# Patient Record
Sex: Male | Born: 1955 | Race: White | Hispanic: No | Marital: Married | Smoking: Former smoker
Health system: Southern US, Community
[De-identification: ages and names within clinical notes are randomized; demographics above are authoritative.]

## PROBLEM LIST (undated history)

## (undated) DIAGNOSIS — I10 Essential (primary) hypertension: Secondary | ICD-10-CM

## (undated) DIAGNOSIS — M1990 Unspecified osteoarthritis, unspecified site: Secondary | ICD-10-CM

## (undated) DIAGNOSIS — M549 Dorsalgia, unspecified: Secondary | ICD-10-CM

## (undated) DIAGNOSIS — F329 Major depressive disorder, single episode, unspecified: Secondary | ICD-10-CM

## (undated) DIAGNOSIS — N400 Enlarged prostate without lower urinary tract symptoms: Secondary | ICD-10-CM

## (undated) DIAGNOSIS — E785 Hyperlipidemia, unspecified: Secondary | ICD-10-CM

## (undated) DIAGNOSIS — F1120 Opioid dependence, uncomplicated: Secondary | ICD-10-CM

## (undated) DIAGNOSIS — F32A Depression, unspecified: Secondary | ICD-10-CM

## (undated) DIAGNOSIS — R569 Unspecified convulsions: Secondary | ICD-10-CM

## (undated) DIAGNOSIS — F419 Anxiety disorder, unspecified: Secondary | ICD-10-CM

## (undated) DIAGNOSIS — G4733 Obstructive sleep apnea (adult) (pediatric): Secondary | ICD-10-CM

## (undated) DIAGNOSIS — E669 Obesity, unspecified: Secondary | ICD-10-CM

## (undated) DIAGNOSIS — E7800 Pure hypercholesterolemia, unspecified: Secondary | ICD-10-CM

## (undated) DIAGNOSIS — Z79891 Long term (current) use of opiate analgesic: Secondary | ICD-10-CM

## (undated) DIAGNOSIS — G8929 Other chronic pain: Secondary | ICD-10-CM

## (undated) HISTORY — PX: CERVICAL SPINE SURGERY: SHX589

## (undated) HISTORY — DX: Pure hypercholesterolemia, unspecified: E78.00

## (undated) HISTORY — DX: Essential (primary) hypertension: I10

## (undated) HISTORY — DX: Hyperlipidemia, unspecified: E78.5

## (undated) HISTORY — DX: Unspecified osteoarthritis, unspecified site: M19.90

## (undated) HISTORY — DX: Benign prostatic hyperplasia without lower urinary tract symptoms: N40.0

## (undated) HISTORY — PX: PROSTATECTOMY: SHX69

## (undated) HISTORY — DX: Obesity, unspecified: E66.9

## (undated) HISTORY — DX: Obstructive sleep apnea (adult) (pediatric): G47.33

## (undated) HISTORY — PX: BACK SURGERY: SHX140

---

## 1997-11-17 ENCOUNTER — Emergency Department (HOSPITAL_COMMUNITY): Admission: EM | Admit: 1997-11-17 | Discharge: 1997-11-17 | Payer: Self-pay | Admitting: Emergency Medicine

## 1997-11-22 ENCOUNTER — Emergency Department (HOSPITAL_COMMUNITY): Admission: EM | Admit: 1997-11-22 | Discharge: 1997-11-22 | Payer: Self-pay | Admitting: Emergency Medicine

## 1998-04-18 ENCOUNTER — Emergency Department (HOSPITAL_COMMUNITY): Admission: EM | Admit: 1998-04-18 | Discharge: 1998-04-18 | Payer: Self-pay | Admitting: Emergency Medicine

## 1998-04-18 ENCOUNTER — Encounter: Payer: Self-pay | Admitting: Emergency Medicine

## 1998-10-09 ENCOUNTER — Encounter: Payer: Self-pay | Admitting: Internal Medicine

## 1998-10-09 ENCOUNTER — Ambulatory Visit (HOSPITAL_COMMUNITY): Admission: RE | Admit: 1998-10-09 | Discharge: 1998-10-09 | Payer: Self-pay | Admitting: Internal Medicine

## 1999-01-07 ENCOUNTER — Emergency Department (HOSPITAL_COMMUNITY): Admission: EM | Admit: 1999-01-07 | Discharge: 1999-01-07 | Payer: Self-pay

## 1999-04-18 ENCOUNTER — Emergency Department (HOSPITAL_COMMUNITY): Admission: EM | Admit: 1999-04-18 | Discharge: 1999-04-18 | Payer: Self-pay | Admitting: Emergency Medicine

## 1999-04-20 ENCOUNTER — Encounter: Payer: Self-pay | Admitting: Sports Medicine

## 1999-04-20 ENCOUNTER — Ambulatory Visit (HOSPITAL_COMMUNITY): Admission: RE | Admit: 1999-04-20 | Discharge: 1999-04-20 | Payer: Self-pay | Admitting: Sports Medicine

## 1999-06-27 ENCOUNTER — Emergency Department (HOSPITAL_COMMUNITY): Admission: EM | Admit: 1999-06-27 | Discharge: 1999-06-27 | Payer: Self-pay | Admitting: Emergency Medicine

## 1999-10-14 ENCOUNTER — Emergency Department (HOSPITAL_COMMUNITY): Admission: EM | Admit: 1999-10-14 | Discharge: 1999-10-14 | Payer: Self-pay | Admitting: Emergency Medicine

## 1999-11-04 ENCOUNTER — Encounter: Admission: RE | Admit: 1999-11-04 | Discharge: 2000-02-02 | Payer: Self-pay | Admitting: Anesthesiology

## 2000-01-11 ENCOUNTER — Emergency Department (HOSPITAL_COMMUNITY): Admission: EM | Admit: 2000-01-11 | Discharge: 2000-01-11 | Payer: Self-pay | Admitting: *Deleted

## 2000-01-17 ENCOUNTER — Encounter: Payer: Self-pay | Admitting: Internal Medicine

## 2000-01-17 ENCOUNTER — Ambulatory Visit (HOSPITAL_COMMUNITY): Admission: RE | Admit: 2000-01-17 | Discharge: 2000-01-17 | Payer: Self-pay | Admitting: Internal Medicine

## 2000-02-06 ENCOUNTER — Emergency Department (HOSPITAL_COMMUNITY): Admission: EM | Admit: 2000-02-06 | Discharge: 2000-02-06 | Payer: Self-pay | Admitting: Internal Medicine

## 2000-03-02 ENCOUNTER — Inpatient Hospital Stay (HOSPITAL_COMMUNITY): Admission: RE | Admit: 2000-03-02 | Discharge: 2000-03-06 | Payer: Self-pay | Admitting: Orthopaedic Surgery

## 2000-03-02 ENCOUNTER — Encounter (INDEPENDENT_AMBULATORY_CARE_PROVIDER_SITE_OTHER): Payer: Self-pay | Admitting: Specialist

## 2000-03-10 ENCOUNTER — Ambulatory Visit (HOSPITAL_COMMUNITY): Admission: RE | Admit: 2000-03-10 | Discharge: 2000-03-10 | Payer: Self-pay | Admitting: Orthopaedic Surgery

## 2000-05-17 ENCOUNTER — Encounter: Payer: Self-pay | Admitting: Emergency Medicine

## 2000-05-17 ENCOUNTER — Emergency Department (HOSPITAL_COMMUNITY): Admission: EM | Admit: 2000-05-17 | Discharge: 2000-05-17 | Payer: Self-pay | Admitting: Emergency Medicine

## 2000-06-28 ENCOUNTER — Ambulatory Visit (HOSPITAL_COMMUNITY): Admission: RE | Admit: 2000-06-28 | Discharge: 2000-06-28 | Payer: Self-pay | Admitting: Internal Medicine

## 2000-06-28 ENCOUNTER — Encounter: Payer: Self-pay | Admitting: Internal Medicine

## 2000-08-04 ENCOUNTER — Emergency Department (HOSPITAL_COMMUNITY): Admission: EM | Admit: 2000-08-04 | Discharge: 2000-08-04 | Payer: Self-pay | Admitting: Emergency Medicine

## 2000-08-04 ENCOUNTER — Encounter: Payer: Self-pay | Admitting: Emergency Medicine

## 2000-08-15 ENCOUNTER — Ambulatory Visit (HOSPITAL_COMMUNITY): Admission: RE | Admit: 2000-08-15 | Discharge: 2000-08-15 | Payer: Self-pay | Admitting: Orthopaedic Surgery

## 2000-12-07 ENCOUNTER — Encounter: Payer: Self-pay | Admitting: Emergency Medicine

## 2000-12-07 ENCOUNTER — Emergency Department (HOSPITAL_COMMUNITY): Admission: EM | Admit: 2000-12-07 | Discharge: 2000-12-07 | Payer: Self-pay | Admitting: Emergency Medicine

## 2000-12-11 ENCOUNTER — Emergency Department (HOSPITAL_COMMUNITY): Admission: EM | Admit: 2000-12-11 | Discharge: 2000-12-11 | Payer: Self-pay | Admitting: Emergency Medicine

## 2000-12-17 ENCOUNTER — Emergency Department (HOSPITAL_COMMUNITY): Admission: EM | Admit: 2000-12-17 | Discharge: 2000-12-17 | Payer: Self-pay | Admitting: Emergency Medicine

## 2000-12-17 ENCOUNTER — Encounter: Payer: Self-pay | Admitting: Emergency Medicine

## 2000-12-20 ENCOUNTER — Encounter: Payer: Self-pay | Admitting: Emergency Medicine

## 2000-12-20 ENCOUNTER — Emergency Department (HOSPITAL_COMMUNITY): Admission: EM | Admit: 2000-12-20 | Discharge: 2000-12-20 | Payer: Self-pay | Admitting: Emergency Medicine

## 2001-05-06 ENCOUNTER — Emergency Department (HOSPITAL_COMMUNITY): Admission: EM | Admit: 2001-05-06 | Discharge: 2001-05-06 | Payer: Self-pay

## 2001-05-25 ENCOUNTER — Encounter: Admission: RE | Admit: 2001-05-25 | Discharge: 2001-05-25 | Payer: Self-pay | Admitting: Orthopaedic Surgery

## 2001-06-05 ENCOUNTER — Encounter: Admission: RE | Admit: 2001-06-05 | Discharge: 2001-06-05 | Payer: Self-pay | Admitting: Orthopaedic Surgery

## 2001-06-19 ENCOUNTER — Encounter: Admission: RE | Admit: 2001-06-19 | Discharge: 2001-06-19 | Payer: Self-pay | Admitting: Orthopaedic Surgery

## 2001-07-03 ENCOUNTER — Encounter: Admission: RE | Admit: 2001-07-03 | Discharge: 2001-07-03 | Payer: Self-pay | Admitting: Orthopaedic Surgery

## 2001-07-31 ENCOUNTER — Emergency Department (HOSPITAL_COMMUNITY): Admission: EM | Admit: 2001-07-31 | Discharge: 2001-07-31 | Payer: Self-pay

## 2001-08-21 ENCOUNTER — Ambulatory Visit (HOSPITAL_BASED_OUTPATIENT_CLINIC_OR_DEPARTMENT_OTHER): Admission: RE | Admit: 2001-08-21 | Discharge: 2001-08-21 | Payer: Self-pay | Admitting: *Deleted

## 2001-10-01 ENCOUNTER — Encounter: Payer: Self-pay | Admitting: Emergency Medicine

## 2001-10-02 ENCOUNTER — Encounter (INDEPENDENT_AMBULATORY_CARE_PROVIDER_SITE_OTHER): Payer: Self-pay | Admitting: Specialist

## 2001-10-02 ENCOUNTER — Encounter: Payer: Self-pay | Admitting: *Deleted

## 2001-10-02 ENCOUNTER — Inpatient Hospital Stay (HOSPITAL_COMMUNITY): Admission: EM | Admit: 2001-10-02 | Discharge: 2001-10-03 | Payer: Self-pay | Admitting: *Deleted

## 2001-10-03 ENCOUNTER — Encounter: Payer: Self-pay | Admitting: *Deleted

## 2001-10-11 ENCOUNTER — Ambulatory Visit (HOSPITAL_BASED_OUTPATIENT_CLINIC_OR_DEPARTMENT_OTHER): Admission: RE | Admit: 2001-10-11 | Discharge: 2001-10-11 | Payer: Self-pay | Admitting: *Deleted

## 2002-05-30 ENCOUNTER — Encounter: Payer: Self-pay | Admitting: Neurology

## 2002-05-30 ENCOUNTER — Encounter: Admission: RE | Admit: 2002-05-30 | Discharge: 2002-05-30 | Payer: Self-pay | Admitting: Neurology

## 2002-06-24 ENCOUNTER — Inpatient Hospital Stay (HOSPITAL_COMMUNITY): Admission: RE | Admit: 2002-06-24 | Discharge: 2002-06-25 | Payer: Self-pay | Admitting: Orthopaedic Surgery

## 2002-07-14 ENCOUNTER — Emergency Department (HOSPITAL_COMMUNITY): Admission: EM | Admit: 2002-07-14 | Discharge: 2002-07-14 | Payer: Self-pay | Admitting: Emergency Medicine

## 2003-06-18 ENCOUNTER — Ambulatory Visit (HOSPITAL_COMMUNITY): Admission: RE | Admit: 2003-06-18 | Discharge: 2003-06-19 | Payer: Self-pay | Admitting: Orthopaedic Surgery

## 2003-09-11 ENCOUNTER — Ambulatory Visit (HOSPITAL_COMMUNITY): Admission: RE | Admit: 2003-09-11 | Discharge: 2003-09-11 | Payer: Self-pay | Admitting: Internal Medicine

## 2004-04-01 ENCOUNTER — Ambulatory Visit (HOSPITAL_COMMUNITY): Admission: RE | Admit: 2004-04-01 | Discharge: 2004-04-01 | Payer: Self-pay | Admitting: Internal Medicine

## 2004-08-17 ENCOUNTER — Ambulatory Visit (HOSPITAL_COMMUNITY): Admission: RE | Admit: 2004-08-17 | Discharge: 2004-08-17 | Payer: Self-pay | Admitting: Internal Medicine

## 2005-02-05 ENCOUNTER — Emergency Department (HOSPITAL_COMMUNITY): Admission: EM | Admit: 2005-02-05 | Discharge: 2005-02-06 | Payer: Self-pay | Admitting: Emergency Medicine

## 2005-03-28 ENCOUNTER — Ambulatory Visit (HOSPITAL_COMMUNITY): Admission: RE | Admit: 2005-03-28 | Discharge: 2005-03-28 | Payer: Self-pay | Admitting: Internal Medicine

## 2005-04-01 ENCOUNTER — Ambulatory Visit: Payer: Self-pay | Admitting: Internal Medicine

## 2005-04-13 ENCOUNTER — Ambulatory Visit: Payer: Self-pay | Admitting: Internal Medicine

## 2005-04-13 ENCOUNTER — Encounter (INDEPENDENT_AMBULATORY_CARE_PROVIDER_SITE_OTHER): Payer: Self-pay | Admitting: Specialist

## 2005-05-04 ENCOUNTER — Emergency Department (HOSPITAL_COMMUNITY): Admission: EM | Admit: 2005-05-04 | Discharge: 2005-05-04 | Payer: Self-pay | Admitting: Emergency Medicine

## 2005-05-10 ENCOUNTER — Ambulatory Visit: Payer: Self-pay | Admitting: Internal Medicine

## 2005-05-17 ENCOUNTER — Ambulatory Visit: Payer: Self-pay | Admitting: Internal Medicine

## 2005-06-06 ENCOUNTER — Ambulatory Visit: Payer: Self-pay | Admitting: Internal Medicine

## 2005-06-08 ENCOUNTER — Ambulatory Visit: Payer: Self-pay | Admitting: Cardiology

## 2005-06-13 ENCOUNTER — Ambulatory Visit: Payer: Self-pay | Admitting: Emergency Medicine

## 2005-06-20 ENCOUNTER — Ambulatory Visit (HOSPITAL_BASED_OUTPATIENT_CLINIC_OR_DEPARTMENT_OTHER): Admission: RE | Admit: 2005-06-20 | Discharge: 2005-06-20 | Payer: Self-pay | Admitting: Internal Medicine

## 2005-06-21 ENCOUNTER — Ambulatory Visit: Payer: Self-pay | Admitting: Pulmonary Disease

## 2005-07-28 ENCOUNTER — Ambulatory Visit: Payer: Self-pay | Admitting: Internal Medicine

## 2005-08-28 ENCOUNTER — Emergency Department (HOSPITAL_COMMUNITY): Admission: EM | Admit: 2005-08-28 | Discharge: 2005-08-28 | Payer: Self-pay | Admitting: Emergency Medicine

## 2005-08-30 ENCOUNTER — Ambulatory Visit (HOSPITAL_COMMUNITY): Admission: RE | Admit: 2005-08-30 | Discharge: 2005-08-30 | Payer: Self-pay | Admitting: Sports Medicine

## 2005-08-31 ENCOUNTER — Ambulatory Visit: Payer: Self-pay | Admitting: Cardiovascular Disease

## 2005-09-13 ENCOUNTER — Ambulatory Visit: Payer: Self-pay | Admitting: Pulmonary Disease

## 2005-10-10 ENCOUNTER — Ambulatory Visit: Payer: Self-pay | Admitting: Pulmonary Disease

## 2006-01-22 ENCOUNTER — Emergency Department (HOSPITAL_COMMUNITY): Admission: EM | Admit: 2006-01-22 | Discharge: 2006-01-22 | Payer: Self-pay | Admitting: Emergency Medicine

## 2006-01-23 ENCOUNTER — Emergency Department (HOSPITAL_COMMUNITY): Admission: EM | Admit: 2006-01-23 | Discharge: 2006-01-23 | Payer: Self-pay | Admitting: *Deleted

## 2006-02-02 ENCOUNTER — Inpatient Hospital Stay (HOSPITAL_COMMUNITY): Admission: RE | Admit: 2006-02-02 | Discharge: 2006-02-04 | Payer: Self-pay | Admitting: Urology

## 2006-02-02 ENCOUNTER — Encounter (INDEPENDENT_AMBULATORY_CARE_PROVIDER_SITE_OTHER): Payer: Self-pay | Admitting: Specialist

## 2006-06-03 ENCOUNTER — Ambulatory Visit: Payer: Self-pay | Admitting: Cardiology

## 2006-06-03 ENCOUNTER — Observation Stay (HOSPITAL_COMMUNITY): Admission: EM | Admit: 2006-06-03 | Discharge: 2006-06-04 | Payer: Self-pay | Admitting: Emergency Medicine

## 2006-06-08 ENCOUNTER — Ambulatory Visit: Payer: Self-pay

## 2006-06-12 ENCOUNTER — Ambulatory Visit: Payer: Self-pay | Admitting: Cardiology

## 2006-06-13 ENCOUNTER — Encounter: Payer: Self-pay | Admitting: Cardiology

## 2006-06-13 ENCOUNTER — Ambulatory Visit: Payer: Self-pay

## 2006-07-04 ENCOUNTER — Ambulatory Visit: Payer: Self-pay | Admitting: Cardiovascular Disease

## 2006-07-07 ENCOUNTER — Ambulatory Visit (HOSPITAL_BASED_OUTPATIENT_CLINIC_OR_DEPARTMENT_OTHER): Admission: RE | Admit: 2006-07-07 | Discharge: 2006-07-07 | Payer: Self-pay | Admitting: Surgery

## 2007-02-19 ENCOUNTER — Ambulatory Visit: Payer: Self-pay | Admitting: Pulmonary Disease

## 2007-11-01 ENCOUNTER — Ambulatory Visit (HOSPITAL_COMMUNITY): Admission: RE | Admit: 2007-11-01 | Discharge: 2007-11-02 | Payer: Self-pay | Admitting: Otolaryngology

## 2007-11-01 ENCOUNTER — Encounter (INDEPENDENT_AMBULATORY_CARE_PROVIDER_SITE_OTHER): Payer: Self-pay | Admitting: Otolaryngology

## 2008-10-30 ENCOUNTER — Emergency Department (HOSPITAL_COMMUNITY): Admission: EM | Admit: 2008-10-30 | Discharge: 2008-10-30 | Payer: Self-pay | Admitting: Emergency Medicine

## 2008-12-31 ENCOUNTER — Ambulatory Visit (HOSPITAL_COMMUNITY): Admission: RE | Admit: 2008-12-31 | Discharge: 2008-12-31 | Payer: Self-pay | Admitting: Internal Medicine

## 2009-07-09 ENCOUNTER — Ambulatory Visit (HOSPITAL_COMMUNITY): Admission: RE | Admit: 2009-07-09 | Discharge: 2009-07-09 | Payer: Self-pay | Admitting: Orthopaedic Surgery

## 2009-07-24 ENCOUNTER — Emergency Department (HOSPITAL_BASED_OUTPATIENT_CLINIC_OR_DEPARTMENT_OTHER): Admission: EM | Admit: 2009-07-24 | Discharge: 2009-07-25 | Payer: Self-pay | Admitting: Emergency Medicine

## 2010-03-19 LAB — HM COLONOSCOPY

## 2010-06-13 ENCOUNTER — Encounter: Payer: Self-pay | Admitting: Sports Medicine

## 2010-08-17 ENCOUNTER — Other Ambulatory Visit (HOSPITAL_COMMUNITY): Payer: Self-pay | Admitting: Orthopaedic Surgery

## 2010-08-17 DIAGNOSIS — M25512 Pain in left shoulder: Secondary | ICD-10-CM

## 2010-08-17 DIAGNOSIS — M75100 Unspecified rotator cuff tear or rupture of unspecified shoulder, not specified as traumatic: Secondary | ICD-10-CM

## 2010-08-24 ENCOUNTER — Ambulatory Visit (HOSPITAL_COMMUNITY)
Admission: RE | Admit: 2010-08-24 | Discharge: 2010-08-24 | Disposition: A | Discharge status: Home / Self Care | Payer: 59 | Source: Ambulatory Visit | Attending: Orthopaedic Surgery | Admitting: Orthopaedic Surgery

## 2010-08-24 DIAGNOSIS — M75100 Unspecified rotator cuff tear or rupture of unspecified shoulder, not specified as traumatic: Secondary | ICD-10-CM

## 2010-08-24 DIAGNOSIS — M67919 Unspecified disorder of synovium and tendon, unspecified shoulder: Secondary | ICD-10-CM | POA: Insufficient documentation

## 2010-08-24 DIAGNOSIS — M19019 Primary osteoarthritis, unspecified shoulder: Secondary | ICD-10-CM | POA: Insufficient documentation

## 2010-08-24 DIAGNOSIS — M25512 Pain in left shoulder: Secondary | ICD-10-CM

## 2010-08-24 DIAGNOSIS — M25519 Pain in unspecified shoulder: Secondary | ICD-10-CM | POA: Insufficient documentation

## 2010-08-24 DIAGNOSIS — M719 Bursopathy, unspecified: Secondary | ICD-10-CM | POA: Insufficient documentation

## 2010-08-30 LAB — CBC
HCT: 49.7 % (ref 39.0–52.0)
Hemoglobin: 16.9 g/dL (ref 13.0–17.0)
MCHC: 33.9 g/dL (ref 30.0–36.0)
MCV: 94.4 fL (ref 78.0–100.0)
Platelets: 150 10*3/uL (ref 150–400)
RBC: 5.27 MIL/uL (ref 4.22–5.81)
RDW: 14.1 % (ref 11.5–15.5)
WBC: 16.2 10*3/uL — ABNORMAL HIGH (ref 4.0–10.5)

## 2010-08-30 LAB — COMPREHENSIVE METABOLIC PANEL
ALT: 23 U/L (ref 0–53)
AST: 38 U/L — ABNORMAL HIGH (ref 0–37)
Albumin: 3.5 g/dL (ref 3.5–5.2)
Alkaline Phosphatase: 39 U/L (ref 39–117)
BUN: 5 mg/dL — ABNORMAL LOW (ref 6–23)
CO2: 30 mEq/L (ref 19–32)
Calcium: 9.1 mg/dL (ref 8.4–10.5)
Chloride: 99 mEq/L (ref 96–112)
Creatinine, Ser: 1.22 mg/dL (ref 0.4–1.5)
GFR calc Af Amer: >60 mL/min (ref >60)
GFR calc non Af Amer: >60 mL/min (ref >60)
Glucose, Bld: 111 mg/dL — ABNORMAL HIGH (ref 70–99)
Potassium: 3.6 mEq/L (ref 3.5–5.1)
Sodium: 138 mEq/L (ref 135–145)
Total Bilirubin: 1.1 mg/dL (ref 0.3–1.2)
Total Protein: 6.1 g/dL (ref 6.0–8.3)

## 2010-08-30 LAB — DIFFERENTIAL
Basophils Relative: 0 % (ref 0–1)
Monocytes Absolute: 0.5 10*3/uL (ref 0.1–1.0)
Monocytes Relative: 3 % (ref 3–12)
Neutro Abs: 15 10*3/uL — ABNORMAL HIGH (ref 1.7–7.7)

## 2010-08-30 LAB — CULTURE, BLOOD (ROUTINE X 2)

## 2010-08-30 LAB — URINALYSIS, ROUTINE W REFLEX MICROSCOPIC
Glucose, UA: NEGATIVE mg/dL
Hgb urine dipstick: NEGATIVE
Protein, ur: NEGATIVE mg/dL
Specific Gravity, Urine: 1.011 (ref 1.005–1.030)

## 2010-10-05 NOTE — Op Note (Signed)
NAME:  Jared [name], Jared [name] NO.:  [id number]   MEDICAL RECORD NO.:  [id number]          PATIENT TYPE:  OIB   LOCATION:  3311                         FACILITY:  [facility]   PHYSICIAN:  Suzanna Obey, M.D.       DATE OF BIRTH:  1955/07/20   DATE OF PROCEDURE:  11/01/2007  DATE OF DISCHARGE:                               OPERATIVE REPORT   PREOPERATIVE DIAGNOSIS:  Obstructive sleep apnea.   POSTOPERATIVE DIAGNOSIS:  Obstructive sleep apnea.   SURGICAL PROCEDURE:  Uvulopharyngopalatoplasty and tonsillectomy.   ANESTHESIA:  General.   ESTIMATED BLOOD LOSS:  Approximately 10 mL.   INDICATIONS:  This is a 55 year old with significant obstructive sleep  apnea who has basically tried CPAP, but is not doing well at all.  He  does not seem to have a significant amount of nasal obstruction.  He  wants to proceed with the surgical options with  uvulopharyngopalatoplasty and tonsillectomy, knowing the risks of the  failure to cure the sleep apnea.  All his questions were answered and  options were discussed.  The risks and benefits were also discussed.  Consent was obtained.   OPERATION:  The patient was taken to the operating room and placed in  supine position.  After adequate general endotracheal tube anesthesia,  he was placed in the supine position and was draped in the usual sterile  manner.  A Crowe-Davis mouth gag was inserted, retracted, and suspended  from the Mayo stand.  There was good visualization of the pharynx in  neutral position.  At the left tonsil, we began making a left anterior  tonsillar pillar incision with electrocautery dissecting down through  the capsule, the incision was brought across the soft palate at the base  of the uvula into the right tonsil area.  Both tonsils were removed with  electrocautery dissection along the capsule and then the palate was  divided at its incision site along the edge of the palate.  Hemostasis  was achieved with suction  cautery.  The wound was then closed with  interrupted 3-0 Vicryl sutures.  Irrigation with saline.  The CroweEarlene Plater was released and resuspended.  Hemostasis present in all  locations.  Then, the hypopharynx, esophagus, and stomach were suctioned  with the NG tube.  The patient was awakened and brought to the recovery  room in stable condition.  Counts were correct.           ______________________________  Suzanna Obey, M.D.     JB/MEDQ  D:  11/01/2007  T:  11/02/2007  Job:  [id number]

## 2010-10-08 NOTE — Discharge Summary (Signed)
NAME:  Jared [name], Jared [name] NO.:  [id number]   MEDICAL RECORD NO.:  [id number]          PATIENT TYPE:  OBV   LOCATION:  1439                         [facility]ACILITY:  [facility]   PHYSICIAN:  Gerrit Friends. Dietrich Pates, MD, FACCDATE OF BIRTH:  Oct 13, 1955   DATE OF ADMISSION:  06/03/2006  DATE OF DISCHARGE:  06/04/2006                               DISCHARGE SUMMARY   PRIMARY CARDIOLOGIST:  Rollene Rotunda, MD.   DISCHARGING DIAGNOSIS:  Chest pain.  Patient with negative cardiac  workup this admission.  Cardiac enzymes with a troponin of 0.01.  D-  dimer of less than 0.22.  Chest x-ray showing no active cardiopulmonary  disease with stable cardiac enlargement.   PAST MEDICAL HISTORY:  1. Hypertension.  2. Hyperlipidemia.  3. Former smoker.  4. 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  5. BPH, status post TURP procedure in October 2007.  6. Obstructive sleep apnea on CPAP q.h.s.   HOSPITAL COURSE:  Jared [name] is a 55 year old Caucasian gentleman with  cardiac risk factors as stated above, seen in the past (greater than 10  years ago) by someone at [facility].  The patient reports he  underwent a stress test that time.  He presented to [facility]  emergency room complaining of chest pain he described as sharp, left-  sided chest pain over several days.  The patient felt that it often  seemed provoked by stress or anxiety.  The patient was seen by Dr.  Nevin Bloodgood.  He reviewed EKG, which showed normal sinus rhythm at 60 beats  per minute with borderline voltage criteria for LVH, no acute ST or T-  wave abnormalities.  Baseline lab work showed a WBC of 6, hemoglobin  14.5, hematocrit 41, platelets 229,000.  Potassium 4.2, BUN and  creatinine 11 and 1.1.  Point of care markers were negative.  The  patient had atypical chest pain.  EKG was unremarkable.  Dr. Nevin Bloodgood felt  the patient  was a low risk for acute coronary syndrome.  He admitted the  patient for observation for 23 hours, continued the patient's home  medications, cycled cardiac markers, also continued the patient's home  medication for chronic neck and back pain including oxycodone ER with  p.o. oxycodone for breakthrough discomfort.  Dr. Dietrich Pates saw the  patient on day of discharge, at which time the patient was stable.  His  temperature was 98.1, heart rate 59, respirations 20, blood pressure  113/65.  He was saturating 94% on room air.  Weight 248.2 pounds.  The  patient's blood work ruled him out for a myocardial infarction.  Other  lab work as stated above.  The patient's AST was 30, ALT 38.  The  patient discharged home by Dr. Dietrich Pates with instructions to continue  his previous medications and to add aspirin.  Discharge instructions  were given by Dr. Dietrich Pates.   At time of admission, H&P states that the patient home medications are:  1.  Atenolol 100 mg daily.  2. Benicar/HCT 40/12.5 mg daily.  3. Lipitor 20 mg daily.  4. Fish oil.  5. Potassium chloride 20 mEq daily.  6. Cymbalta 60 mg daily.  7. Oxycodone extended release 80 mg t.i.d.   The patient has an allergy to MORPHINE.   I did not see the patient to confirm these home medications with him.  The patient also needs to follow up with a stress Myoview at our office  and then have an appointment to see Dr. Antoine Poche post stress test  concerning results of Myoview.  I have called the office in left a  message for them to contact the patient at home regarding stress Myoview  and follow-up appoint with Dr. Antoine Poche as I did not personally speak  with the patient myself.   DURATION OF DISCHARGE DICTATION:  15 minutes.      Dorian Pod, ACNP      Gerrit Friends. Dietrich Pates, MD, [facility]  Electronically Signed    MB/MEDQ  D:  06/05/2006  T:  06/06/2006  Job:  [id number]   cc:   Lucky Cowboy, M.D.

## 2010-10-08 NOTE — Op Note (Signed)
[place]  Patient:    Jared [name], Jared [name]                        MRN: [id number] Proc. Date: 03/02/00 Adm. Date:  [id number] Disc. 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  PROCEDURE:  L5 laminectomy with removal of free fragment, L5-S1, L5-S1 fusion, pedicle screw instrumentation, with local and banked bone graft.  SURGEON:  Mark C. Ophelia Charter, M.D.  ASSISTANT:  Colon Flattery. Ollen Bowl, M.D.  ANESTHESIA:  GOT.  BLOOD LOSS:  1000 ml.  INSTRUMENTATION:  ______  pedicle screws, 5.5 x 40.0-mm rod, 6.75 x 40-mm pedicle screws.  DESCRIPTION OF PROCEDURE:  After induction of general anesthesia with orotracheal intubation, patient was placed in the supine position on chest rolls on the fluoroscopic table.  After careful positioning and padding, back was prepped with Duraprep.  Preoperative Ancef was given prophylactically. Area was squared with towels, Betadine and ViDrape applied and laminectomy sheets and drapes.  A midline incision was made, incorporating the old midline incision.  Incision was extended proximally and distally and subperiosteal dissection on the lamina was performed.  All previous scar tissue was encountered and the lamina of L5 was meticulously cleaned of soft tissue and then removed with a large Kerrison rongeur after patties were placed and the undersurface of the lamina was freed up from any scar tissue.  Bone was cleaned of soft tissue and placed in a cup for use in later bone grafting. Patient had had previous disk herniation on the left at L5 with central disk herniation with free fragment which had migrated inferiorly.  All bone was removed out to the lateral gutters and hypertrophic ligamentum was  removed out to the level of the pedicle.  Once the decompression was done out to the level of the pedicle, the operative microscope was draped, brought in and using the operative microscope, bilateral foraminotomies were performed with removal of the free fragment, disk herniation and several passes were made through the L5-S1 disk with minimal remaining degenerative disk material. Once the area anterior to the dura was completely clear of any fragments and two small epidural veins had been cauterized, with both pedicles well-exposed and easily palpated, the ______  pedicle screws were placed by removing the bone overlying the superior facets, sounding the pedicle with the Joystick, blunt trocar, checking it under fluoroscopy, AP and lateral, and then passing the screw after depth gauge measurements.  All four screws were inserted and were entirely within the pedicle, with convergence anteriorly about midway in the body.  Nerve roots were inspected and all four screws were in satisfactory position.  Rods were connected and then the transverse processes were exposed. Lateral gutter was curetted with straight and angled curettes.  A Hall bur was used to decorticate the transverse process to a good bleeding surface and a combination of local bone graft and banked bone graft was used, splitting the local and banked bone into two even piles and placing half on the left gutter and half on the right.  Bone was meticulously placed in the gutter between the transverse processes and up onto the sacral ala.  There  was no bone in the midline and there was thorough decompression of the area of stenosis.  Fascia was reapproximated with 0 Vicryl sutures, 2-0 Vicryl in subcutaneous tissue, skin staple closure, Marcaine infiltration in the skin and a postop dressing. Patient tolerated the procedure well and was neurologically intact in the recovery room. DD:  04/15/00 TD:  04/15/00 Job:  [id number]

## 2010-10-08 NOTE — Discharge Summary (Signed)
Jared [name], MOSLEY NO.:  [id number]   MEDICAL RECORD NO.:  [id number]          PATIENT TYPE:  EMS   LOCATION:  ED                           FACILITY:  [facility]   PHYSICIAN:  Madaline Savage, MD        DATE OF BIRTH:  27-Aug-1955   DATE OF ADMISSION:  01/23/2006  DATE OF DISCHARGE:  01/23/2006                                 DISCHARGE SUMMARY   REASON FOR CONSULTATION:  Consult from the ER for possible admission.   HISTORY OF PRESENT ILLNESS:  Mr. Mounger is a 55 year old Caucasian male with  a past medical history significant for recent hospital admission in May of  2007 with acute respiratory failure, renal failure and change in mental  status at [facility].  At that time he was in the hospital  but it is not clear what the etiology of his illness was.  He also was  evaluated for inability to urinate and was seen by Dr. Etta Grandchild of Urology.  Since then he has been following with Dr. Etta Grandchild, the urologist, and  yesterday he was here because he could not urinate.  In the ER a Foley  catheter was placed yesterday and he was sent home with the Foley catheter.  He states he started having fever last night with chills and he said the  fever went up to 99 and touched 100 but since then the fever has decreased.  He has also called his urologist who ordered antibiotic and asked him to  come to the ER.  He also complains of increasing swelling in his extremities  which has been going on for awhile now.  He has been following with the  urologist for this complaint.   PHYSICAL EXAMINATION:  GENERAL:  He is alert and oriented x3.  VITAL SIGNS:  Temperature 98.7 (maximum temperature in the ER), blood  pressure 132/60, pulse rate 68, respirations 16.  HEENT:  Head:  Normocephalic, atraumatic.  Pupils:  Bilateral react to  light.  Mucous membranes:  Moist.  NECK:  Supple.  No JVD, no carotid bruits.  CARDIOVASCULAR:  S1, S2  heard.  Regular rate and rhythm.  No  murmurs, rubs  or gallops.  CHEST:  Clear to auscultation.  ABDOMEN:  Soft.  Bowel sounds heard.  EXTREMITIES:  Mild pitting edema.  NEUROLOGIC:  No focal deficits found.   LABORATORY DATA:  White count 7.1, 70% neutrophils, 15% lymphocytes,  hemoglobin 14.7, platelet count 188,000.  Sodium 134, potassium 3.6,  chloride 98, bicarbonate 31, BUN 11, creatinine 1.6 (decreased from 1.7  yesterday).  LFTs within normal limits.  Urinalysis showed leukocyte  esterase negative, nitrites negative.  No leukocytes.   ASSESSMENT/PLAN:  This is a 55 year old gentleman who comes in with  nonspecific fever but he is afebrile in the emergency room.  He is much more  comfortable.  I discussed the possibility of keeping him in and waiting for  the results of blood cultures.  He has an appointment to see his urologist,  Dr. Etta Grandchild, for tomorrow and he said he  would rather go home and followup  with his urologist.  He will go home with the Foley catheter.  He will  followup with urologist tomorrow.  His urologist has ordered an antibiotic,  which they will pick up.  They do not know the  name of the antibiotic at this point in time.  I told them that if the blood  culture grows anything I would call them to let them know about it.   CONDITION ON DISCHARGE:  He is being discharged home in a stable condition.      Madaline Savage, MD  Electronically Signed     PKN/MEDQ  D:  01/23/2006  T:  01/23/2006  Job:  [id number]

## 2010-10-08 NOTE — H&P (Signed)
[facility] ADMISSION   Jared [name], Jared [name]                        MRN:          [id number]  DATE:07/04/2006                            DOB:          07-May-1956    Jared [name] is seen today in followup.  He was initially seen for chest  pain at [facility] at the end of January.  His pain was  atypical.  He had a low risk Myoview study on January 17th with probable  diaphragmatic attenuation and an EF of 49%, which visually looked  normal.   Patient's coronary risk factors include remote tobacco abuse,  hyperlipidemia, and hypertension.   The patient also has significant obstructive sleep apnea.   He is preop for hernia surgery repair with Dr. Wenda Low later this  week.  In talking to the patient, he is not having any recurrent chest  pain.  He had a low risk Myoview.  He had a 2D echocardiogram done here  in the office, which showed no significant valvular heart disease and  normal LV function.  I think actually his biggest risk for surgery  revolves around his sleep apnea and use of BiPAP at night.  He is also  allergic to MORPHINE, and we need to make sure he does not get this  perioperatively.   From a cardiac perspective, he is otherwise stable.  His current  medications include atenolol 100 a day.  I told him to make sure to take  this medicine even the morning before the surgery, Lipitor 20 a day,  Cymbalta 60 a day, Benicar/HCT 40/25, potassium 40 a day.   His baseline EKG is normal.   PHYSICAL EXAMINATION:  VITAL SIGNS:  He is overweight.  Blood pressure  is 115/72, pulse 62 and regular.  NECK:  He has had a previous anterior cervical spine fusion.  HEENT:  Normal.  LUNGS:  Clear.  HEART:  There is an S1 and S2 with normal heart sounds.  ABDOMEN:  Benign.  EXTREMITIES:  He has a very small ventral umbilical hernia.  Distal  pulses are intact with no edema.   IMPRESSION:  1. Stable  atypical chest pain at the end of January, low risk Myoview,      and normal left ventricular function by echocardiogram.  No      recurrent chest pain.  Continue beta blocker therapy      perioperatively.  Blood pressure well controlled on Benicar.  2. Hypercholesterolemia:  Continue low dose Lipitor.   Again, the patient's biggest risk for surgery involves his airway with  previous cervical spine fusion and sleep apnea.  I am sure anesthesia  will follow this closely but from a cardiac  perspective, he is low risk for complications.  He will take his beta  blocker the morning of the  surgery.  I think this is planned for the [facility] day hospital and should  be an outpatient procedure.     [name]. Eden Emms, MD, [facility]  Electronically Signed    PCN/MedQ  DD: 07/04/2006  DT: 07/04/2006  Job #: [id number]

## 2010-10-08 NOTE — H&P (Signed)
[place]  Patient:    Jared [name], Jared [name] Visit Number: [id number] MRN: [id number]          Service Type: MED Location: [id number] Attending Physician:  Daisey Must Dictated by:   Daisey Must, M.D. [facility] Admit Date:  10/02/2001   CC:         Jared [name], M.D.   History and Physical  CHIEF COMPLAINT: Chest pain.  HISTORY OF PRESENT ILLNESS: Jared [name] is a 55 year old male, with cardiac risk factors of hypertension, hyperlipidemia, and tobacco abuse.  He has no known prior cardiac history.  However, he states approximately 10 years ago he was evaluated for chest pain and was told that no abnormalities were found. This evening he was lying on his bed and watching TV at approximately 7:30 p.m. when he developed the sudden onset of a sharp, stabbing left-sided chest pain.  An episode of pain would last only seconds at a time.  He had multiple recurrences.  It then became a dull constant pain which did not go away.  He took an aspirin and was driven by his wife to the [facility] Emergency Room.  In the [facility] Emergency Room he was treated with sublingual and intravenous nitroglycerin and aspirin, and eventually his pain decreased and now there is only very minimal residual.  Associated with this chest pain were symptoms of nausea, shortness of breath, and diaphoresis. In addition, he noted some left arm discomfort throughout the day, not necessarily in conjunction with the chest pain.  The patient denies having prior episodes of pain similar to this.  Recently he was diagnosed with sleep apnea and started on CPAP approximately three days ago.  He states since that time he has had difficulty with what he has felt to be edema in both his hands and feet.  He has also had some dyspnea, which actually has been going on for about four weeks.  He states if he walks upstairs he will become short of breath.  There is no  orthopnea or PND.  He denies any palpitations.  He does have occasional episodes of dizziness.  CARDIAC RISK FACTORS: As outlined above.  He denies history of diabetes or family history of premature coronary artery disease.  PAST MEDICAL HISTORY:  1. Hypertension.  2. Hyperlipidemia.  3. Sleep apnea.  4. Migraine headaches.  5. Status post lumbar spine surgery.  CURRENT MEDICATIONS:  1. Atenolol 100 mg q.d.  2. Verapamil Sustained Release 240 mg q.d.  3. Depakote, unknown dosage, b.i.d.  4. Effexor 75 mg two tablets in the morning, one in the evening.  5. Lipitor 40 mg q.d.  ALLERGIES: NEURONTIN.  SOCIAL HISTORY: The patient is married with one child.  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  Tobacco, currently smokes two to four small cigars per day.  He has a history of greater tobacco use in the past. Alcohol use is social.  Denies illicit drug use.  FAMILY HISTORY: Mother is alive at age 51 with "an enlarged heart."  Father is alive and well at age 25.  There is no family history of premature coronary artery disease.  REVIEW OF SYSTEMS: As outlined above.  Otherwise unremarkable.  PHYSICAL EXAMINATION:  GENERAL: This is a somewhat overweight but otherwise well-appearing male in no acute distress.  VITAL SIGNS: Temperature, afebrile.  Pulse 57 and regular.  Blood pressure is 123/70.  SKIN: Warm and dry without generalized rash.  HEENT: Normocephalic, atraumatic.  Sclerae anicteric.  Eyelids normal.  Oral mucosa is unremarkable.  NECK: There is no adenopathy or thyromegaly.  No JVD.  Carotid upstrokes normal without bruits.  CHEST: Clear to percussion and auscultation.  CARDIAC: Apical impulse nondisplaced.  Regular rate and rhythm.  Positive S4. Normal S1 and S2 without rub, murmur, or S3.  ABDOMEN: Soft, nontender, without organomegaly.  Positive bowel sounds.  No bruits.  EXTREMITIES: No significant clubbing, cyanosis, or edema noted.   Peripheral pulses 2+ throughout with no femoral bruits.  LABORATORY DATA: EKG reveals normal sinus rhythm at a rate of 68, normal EKG.  CBC is within normal limits.  Basic metabolic panel is also within normal limits with a BUN of 5, creatinine 1.0, and potassium 3.5.  PT and PTT are normal.  CK total 128, CK-MB 1.6.  Troponin I is 0.03.  Chest x-ray is pending.  ASSESSMENT/PLAN:  1. Chest pain.  The patient presents with chest pain which is very atypical     for cardiac pain.  However, he does have multiple cardiac risk factors     including hypertension, hyperlipidemia, and tobacco abuse.     Electrocardiogram is unremarkable and initial enzymes are negative.  Plan     at this point is admission to a telemetry bed.  He will be treated with     aspirin, subcutaneous Lovenox, and nitroglycerin.  Cardiac enzymes will be     cycled to rule out myocardial infarction.  If he does rule out myocardial     infarction and does not demonstrate electrocardiogram changes we will     proceed with a stress Cardiolite to rule out ischemia.  In addition, we     will check a D-dimer to rule out possibility of pulmonary embolism,     although this is a lower probability.  2. Hypertension.  Will continue therapy with Atenolol and Verapamil.     However, the beta-blocker will be held prior to his stress test.  3. Hyperlipidemia.  Continue Lipitor.  4. Sleep apnea.  We will hold the CPAP for now as the patient appears to be     having some difficulty with it.  5. Migraine headaches.  Continue his regimen of Depakote and Effexor.Dictated by:   Daisey Must, M.D. [facility] Attending Physician:  Daisey Must DD:  10/02/01 TD:  10/03/01 Job: [id number]

## 2010-10-08 NOTE — Procedures (Signed)
NAME:  Jared [name], Jared [name] NO.:  [id number]   MEDICAL RECORD NO.:  [id number]          PATIENT TYPE:  OUT   LOCATION:  SLEEP CENTER                 FACILITY:  [facility]   PHYSICIAN:  Marcelyn Bruins, M.D. [facility] DATE OF BIRTH:  1956-01-16   DATE OF STUDY:  06/20/2005                              NOCTURNAL POLYSOMNOGRAM   REFERRING PHYSICIAN:  Dr. Sandrea Hughs   INDICATION FOR THE STUDY:  Hypersomnia with sleep apnea.   EPWORTH SCORE:  6   SLEEP ARCHITECTURE:  The patient had a total sleep time of 279 minutes but  never achieved REM or slow wave sleep. Sleep onset latency was prolonged at  49 minutes. Sleep efficiency was severely decreased at 59%.   RESPIRATORY DATA:  The patient underwent split night study where he was  found to have 51 obstructive events and a few central apneas in the first  128 minutes of sleep. This gave him a respiratory disturbance index of 26  events per hour. The events were not positional but there was moderate to  loud snoring noted. By protocol, the patient was then placed on a medium  Respironics nasal pillows and CPAP titration was initiated. Upon initiation  of CPAP the central apneas began to increase in frequency and therefore by  protocol the patient was changed over to BiPAP. He was ultimately titrated  to an inspiratory level of 15 and an expiratory level of 10 cm. On this  setting he had fairly good control of both his obstructive and central  events; however, only had a total sleep time of 9 minutes on this setting.   OXYGEN DATA:  There was O2 desaturation as low as 83% with his obstructive  events.   CARDIAC DATA:  Rare PVC; otherwise, no clinically significant cardiac  arrhythmia.   MOVEMENT/PARASOMNIA:  The patient was found have small numbers of leg jerks  with no clinical significance.   IMPRESSION/RECOMMENDATION:  Split night study reveals moderate obstructive  sleep apnea with a respiratory disturbance index prior to CPAP  of 26 events  per hour and oxygen desaturation as low as 83%. The patient was then placed  on CPAP and ultimately converted to BiPAP because of increased central  events. At a final BiPAP pressure of 15 cm on inspiration and 10 cm of  expiration, the patient had fairly good control of his obstructive events.  However, because of the split night study there was not a lot of time at the  end of the night to  make sure he maintained on this setting. I would therefore recommend  starting the patient on BiPAP at this level. However, if he does not improve  clinically he should return for a formal BiPAP titration.           ______________________________  Marcelyn Bruins, M.D. [facility]  Diplomate, American Board of Sleep  Medicine     KC/MEDQ  D:  06/24/2005 11:47:50  T:  06/24/2005 17:04:13  Job:  [id number]

## 2010-10-08 NOTE — Op Note (Signed)
[place]. [facility]  Patient:    Jared [name], Jared [name] Visit Number: [id number] MRN: [id number]          Service Type: MED Location: [phone] Attending Physician:  Daisey Must Dictated by:   Veverly Fells. Arletha Grippe, M.D. Proc. Date: 10/11/01 Admit Date:  10/02/2001 Discharge Date: 10/03/2001   CC:         Barbaraann Share, M.D. [facility]   Operative Report  PREOPERATIVE DIAGNOSES:  Obstructive sleep apnea and nasal airway obstruction, septal deviation and inferior turbinate hypertrophy.  POSTOPERATIVE DIAGNOSES:  Obstructive sleep apnea and nasal airway obstruction, septal deviation and inferior turbinate hypertrophy.  PROCEDURE PERFORMED:  Nasal septal reconstruction, intramural cauterization of both inferior turbinates.  SURGEON:  Veverly Fells. Arletha Grippe, M.D.  ANESTHESIA:  General endotracheal.  INDICATIONS FOR PROCEDURE:  This is a 55 year old white male who gives a history of nasal airway obstruction, left side greater than the right that has been unremittant due to very aggressive medical therapy with topical nasal steroids, sprays, and oral decongestants.  He does have a history of moderate obstructive sleep apnea based on a sleep test that has been obtained on August 21, 2001.  He had seen Dr. Marcelyn Bruins for evaluation and management of obstructive sleep apnea with nasal CPAP, but unfortunately, due to the problems with the difficulty that he has had with nasal airway obstruction, he is having a hard time tolerating this and we are proceeding with the above noted surgical procedure.  I have discussed extensively with him and his family the risks and benefits from surgery, the risks of general anesthesia, infection, and bleeding, need for septal splinting, and the normal recovery period to expect after the surgery.  I have entertained any questions and answered them appropriately.  Informed consent has been obtained and the patient presents for the above  noted procedure.  OPERATIVE FINDINGS:  Severe septal deviation of the left with inferior turbinate congestion hypertrophy, right side greater than left.  DESCRIPTION OF PROCEDURE:  The patient was brought into the operating room and placed in the supine position.  General endotracheal anesthesia was administered via the anesthesiologist without complications.  The patient was administered 1 g of Ancef IV x1 and 10 mg of Decadron IV x1.  Head of the table was elevated to 30 degrees.  The patients face was draped in the standard fashion.  Cotton pledgets soaked in a 4% cocaine solution were placed in both nares and left in place for approximately 5-10 minutes and then removed.  Both sides of the septum were infiltrated with a 1% lidocaine solution with 1:100,000 epinephrine.  After waiting approximately 10 minutes, a standard Killian incision was made on the left side of the septum.  Mucoperichondrial and mucoperiosteal flap was elevated on the left side using both blunt and sharp dissection.  An intercartilaginous incision was made approximately 1 cm posterior from the caudal end of the septum.  Mucoperichondrial and mucoperiosteal flap was elevated on the right side using both blunt and sharp dissection. Cartilaginous deviation was removed with a Ballenger swivel knife.  Posterior bony deflection was removed with a Jansen-Middleton forceps, and the septal spur which was pushing off in the left nasal chamber was removed using a small osteotome.  Septal flaps were placed back in the midline area.  The septal incision was closed with interrupted chromic suture and the septum was reinforced with a running transseptal plain gut mattress stitch.  Both inferior turbinates were injected with a total of  6 cc of a 1% lidocaine solution with 1:100,000 epinephrine.  Both inferior turbinates were then intramurally cauterized using the Elmed intramural cauterization unit set a 12 watt setting.  Three  passes in both inferior turbinates were performed without difficulty and both inferior turbinates were then outfractured using gentle lateral pressure with a large nasal speculum.  Doyle splints soaked in a Bactroban ointment solution were placed on either side of the septum and held in place with a transseptal Prolene suture.  An orogastric tube was placed.  This was used to decompress the stomach contents.  It was then removed without incident.  Fluids given during the procedure were approximately 1 L of Crystalloid. Estimated blood loss was less than 30 cc.  Urine output was not measured.  No drains.  The above noted two Doyle splints were placed.  The specimens sent were septal cartilage and bone for gross pathology only.  The patient tolerated the procedure well and without complications.  Was extubated in the operating room and transferred to the recovery room in stable condition.  Sponge, instrument, and needle counts were correct at the end of the procedure.  Total duration of procedure was approximately 1-1/2 hours.  The patient will be discharged to home once she has recovered here in the recovery area, and will be sent home on Keflex 500 mg p.o. t.i.d. for 10 days, and Vicodin #30 with two refills one to two tablets p.o. q.4h. p.r.n. pain. He is to have light activity.  No heavy lifting or nose blowing for two weeks after surgery.  Both he and his family given oral and written instructions. They are to call for any problems with bleeding, fever, vomiting, pain, extra medications, or any other questions.  He will follow up in the office for splint removal on Thursday, Oct 18, 2001, at 1:35 p.m. Dictated by:   Veverly Fells. Arletha Grippe, M.D. Attending Physician:  Daisey Must DD:  10/11/01 TD:  10/14/01 Job: [id number]

## 2010-10-08 NOTE — Discharge Summary (Signed)
[place]. [facility]  Patient:    Jared [name], Jared [name]                        MRN: [id number] Adm. Date:  [id number] Disc. Date: 03/06/00 Attending:  Jacki Cones Dictator:   Quinn Plowman, P.A.                           Discharge Summary  DATE OF BIRTH:  12/22/1955.  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  DISCHARGE DIAGNOSIS:  Central stenosis L5-S1 herniated nucleus pulposus.  ADDITIONAL DIAGNOSES: 1. Depression. 2. Anxiety. 3. Hypertension. 4. Hypercholesterolemia.  PROCEDURES:  On March 02, 2000, Mark C. Ophelia Charter, M.D., performed an L5-S1 decompression microdiskectomy and fusion with ________ screw instrumentation.  HISTORY OF PRESENT ILLNESS:  This is a 55 year old white male with progressive low back pain.  He has bilateral leg radiation with the left leg radiating more pain than the right leg.  He has pain with any type of ambulation greater than two blocks, with prolonged sitting or prolonged car rides or prolonged standing.  The patient rates his pain as a 9/10 with 10 being the most pain ever experienced.  The patient states this inhibits his activities of daily living significantly and has progressively worsened in the last month.  He is taking heavy doses of narcotics and Valium to slightly ameliorate his pain. The patient that walking increases pain.  Laying supine in bed only begins to ameliorate the pain.  He has failed conservative measures including nonsteroidal anti-inflammatory drugs, narcotics, physical therapy, and recently he has been unable to sleep secondary to the pain.  The patient also states this is inhibiting his work and his family life.  HOSPITAL COURSE:  On February 01, 2000, the patient underwent an L5-S1 microdiskectomy and fusion with _______ screw instrumentation.  The procedure was an L5 laminectomy with removal of free fragments L5-S1, ________ screw instrumentation  and fusion of L5-S1.  Local and bank bone graft. Postoperatively the patient had some significant amounts of pain and he was taking morphine, Dilaudid and Toradol in the PACU.  On March 03, 2000, postoperative day #1, the patient had a moderate headache with greatest pain a 7/10.  He had fair control of his pain at this time on morphine and Toradol. Tylox p.o. tablets were also added on this day.  Hemoglobin was 10.6, hematocrit 29.5 with platelets 230 on this day.  Maximum temperature 98.7.  On this day he was to attempt to ambulate with corset for weightbearing. Pulses were intact.  EHL, anterior tibialis, gastrocnemius strength was all +2/5. Distal neurovascular sensation is also intact.  On this day also, orthopedic attempted to do an evaluation but due to a headache he was not able to do so. Corset was delivered via [employer].  Physical therapy also completed their evaluation.  Continuing postoperative day #2, March 04, 2000, the patient was stable.  He complained of some nausea with Demerol PCA.  His morphine PCA was changed to Demerol due to a significant amount of pain.  Vital signs were stable.  Bilateral pulses are intact.  Incision site is dry.  No erythema or discharge.  On this day, PCA was changed to Dilaudid over 24 to 48 hours and he was up out of bed on this day.  Occupational therapy completed their note on this day.  Postoperative day #3, March 05, 2000,  the patient was stable. He walked three or four times yesterday.  Pain was well-controlled.  They discontinued his PCA on this day and changed him over to oral Percocet which seemed to do him very well.  Physical therapy also worked with him again.  He also complained of frontal headache on this time but he rates his pain as a 4/10 on this day.  He increased ambulation each day.  Finally on March 06, 2000, postoperative day #4, the patient was doing extremely well. His EHL, anterior tibialis, gastrocnemius was +5/5.  Distal pulses were intact.  Distal neurovascular sensation intact.  He is afebrile and vital signs are stable.  Dressing was dry and he was to be discharged on this day.  LABORATORY VALUES:  His hemoglobin was 9.9 on March 05, 2000, and hematocrit 28.0, platelets 121 and white blood cell count 10.5.  Coagulation studies revealed PT 12.8, INR 1.0, PTT 32.  Sodium 139, potassium 4.3, chloride 102, carbon dioxide 32, glucose 98, BUN 8, creatinine 1.0, calcium 9.7, ALT 32, ALP 59, total bilirubin 5.  Urinalysis was negative.  Chest x-ray revealed no acute pulmonary disease.  He did have a right _________/right middle lobe infiltrate has resolved in the interval.  EKG showed sinus bradycardia, otherwise normal EKG.  DISCHARGE INSTRUCTIONS: 1. He was to be discharged on:    a. FESO-4 iron one tablet twice a day.    b. Percocet one to two tablets every four to six hours as needed for       severe pain.    c. One baby aspirin 81 mg one p.o. q.d.    d. Continue with all home medications per M.D. 2. Activity:  Up with a walker, a three-in-one commode. 3. Wound care:  He may shower then apply dressing change.  Change dressing    everyday.  Also knee-high TED hose were to be applied. 4. Call and make an appointment in the office in seven to 10 days with    Dr. Loraine Leriche C. Yates.  The number is given, [phone].  If any problems    arise before then, please do not hesitate to call the doctor at the same    number.  CONDITION ON DISCHARGE: Stable. DD:  03/06/00 TD:  03/06/00 Job: [id number]

## 2010-10-08 NOTE — Discharge Summary (Signed)
[place]  Patient:    Jared [name], Jared [name] Visit Number: [id number] MRN: [id number]          Service Type: MED Location: [phone] Attending Physician:  Daisey Must Dictated by:   Pennelope Bracken, R.N. Admit Date:  10/02/2001 Discharge Date: 10/03/2001   CC:         Marinus Maw, M.D.9   Discharge Summary  REASON FOR ADMISSION: Atypical chest pain, positive risk factors.  DISCHARGE DIAGNOSES:  1. Chest pain, etiology uncertain, investigated this admission with cardiac     enzymes which were negative, Cardiolite imaging also negative, with     ejection fraction 60% and no electrocardiogram changes; no ischemic ST     morphology seen on electrocardiogram.  2. Hypertension.  3. Hyperlipidemia.  4. Obstructive sleep apnea, recently diagnosed, treated with continuous     positive airway pressure q.h.s.  5. Depression/anxiety.  6. Migraine headaches.  7. Status post back surgery with hardware.  8. Tobacco abuse.  PRIMARY CARE PHYSICIAN: Marinus Maw, M.D.  HISTORY OF PRESENT ILLNESS: This delightful 55 year old gentleman, with medical history as outlined above, was admitted to our service with complaint of left-sided anterior chest pain.  He was found to have multiple risk factors for coronary artery disease in spite of normal EKG and cardiac enzymes.  He was admitted for ischemia work-up.  HOSPITAL COURSE: The patient was admitted to a telemetry unit and maintained on his home medications.  Serial laboratories were drawn.  As mentioned above, his cardiac enzymes were negative, as was a D-dimer.  He was scheduled for a treadmill Cardiolite evaluation, which was conducted day two of admission. There was an attempt at a treadmill Cardiolite but the test was aborted and changed to an adenosine because of the development of dyspnea and chest pain after four minutes.  The patient also experienced increased chest pain with the  adenosine test and subsequent images were negative for ischemia and ejection fraction was 60%.  Incidental findings were of a large left ventricle.  The patient continued to have episodic chest pain during his admission, which could was not reflected in any EKG changes or troponin elevations.  These episodes were treated with nitroglycerin and Xanax with excellent resolution. A CT chest was ordered and this was negative for PE, and no lung disease was noted.  DISCHARGE PHYSICAL EXAMINATION:  GENERAL: The day of discharge the patient had no complaint of chest pain or dyspnea.  Of note, he was able to use his CPAP machine the night prior.  VITAL SIGNS: Blood pressure 115/65, pulse 65, respirations 20, 96% on room air; afebrile; telemetry normal sinus rhythm.  LUNGS: Clear to auscultation bilaterally.  CV: Regular rate and rhythm.  EXTREMITIES: Without clubbing, cyanosis, or edema.  LABORATORY DATA: BNP obtained was within normal limits at 77.  Cardiac enzymes negative x3.  Chemistries - sodium 140, potassium 3.5, chloride 105, CO2 29, BUN 5, creatinine 1.0.  D-dimer negative at 0.27.  WBC 8.2, H&H 14/41.  Other tests as noted above.  DISPOSITION: The patient is discharged home in the care of his wife.  DISCHARGE MEDICATIONS: Home medications.  FOLLOW-UP: He was urged to follow up with Dr. Oneta Rack for his anxiety.  DISCHARGE DIET: He is strongly advised to consume a heart-healthy diet, and this was discussed in detail with the patient and his wife.  He knows to call in the interim with any problems, question or concerns, or change or increase in symptoms.  DISCHARGE INSTRUCTIONS: We have gone over the importance of smoking cessation and he agrees to try to eliminate cigarettes from his life. Dictated by:   Pennelope Bracken, R.N. Attending Physician:  Daisey Must DD:  10/03/01 TD:  10/03/01 Job: [id number]

## 2010-10-08 NOTE — H&P (Signed)
[facility]  Patient:    Jared [name], Jared [name]                        MRN: [id number] Adm. Date:  [id number] Attending:  Thyra Breed CC:         Marinus Maw, M.D.                         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  Since his last evaluation his pain is coming under better control. He rates it as 4/10.  He does note that he is breaking through after about eight hours.  The MSIR resulted in itching and a rash and he does not wish to take that any longer.  He has been back to see Dr. Oneta Rack and had some blood work drawn.  He continues on verapamil, Atenolol, Lipitor, Effexor.  He has not had any more problems with his migraines since his last visit so he has not used any Imitrex.  He is currently up to 40 mg b.i.d. of his OxyContin.  He is using Senokot to keep from being constipated.  PHYSICAL EXAMINATION:  VITAL SIGNS:  Blood pressure 121/60.  Heart rate 61.  Respiratory rate 18.  O2 saturation 98%.  Pain level 4/10.  EXTREMITIES:  Deep tendon reflexes on the left side at the ankle is absent. Right is 1-2+.  Knees are 1-2+.  Straight leg raise sign is positive on the left side.  IMPRESSION: 1. Low back pain with chronic S1 radiculopathy, improved with opiates, but    incomplete control. 2. Other medical problems per Dr. Oneta Rack.  DISPOSITION: 1. Increase OxyContin to two in the morning, one at midday, and two later in    the evening 20 mg # 150 with no refill. 2. Remain off of MSIR. 3. Follow up with me in four weeks. 4. Patient was encouraged to try and walk, increase conditioning of his back. DD:  12/04/99 TD:  12/04/99 Job: [id number]

## 2010-10-08 NOTE — Assessment & Plan Note (Signed)
[facility]                            CARDIOLOGY OFFICE NOTE   Jared [name], Jared [name]                        MRN:          [id number]  DATE:06/12/2006                            DOB:          1956/02/18    PRIMARY CARDIOLOGIST:  [name]. Eden Emms, MD, [facility].   PULMONOLOGIST:  Charlaine Dalton. Sherene Sires, MD, FCCP.   PATIENT PROFILE:  A 55 year old white male with a history of chest pain  who presents following a recent hospitalization.   PROBLEM LIST:  1. Chest pain.      a.     On June 08, 2006, adenosine Myoview, EF 49% although       noted to visually appear better than calculated, so a low risk       study with probable soft tissue attenuation, although could not       exclude mild apical ischemia.  2. Hypertension.  3. Hyperlipidemia.  4. Remote tobacco abuse.  5. Degenerative disk disease.  6. Chronic neck and back pain, status post multiple neck and back      surgeries.  7. BPH, status post TURP, October 2007.  8. Obstructive sleep apnea on CPAP.  9. Obesity.   HISTORY OF PRESENT ILLNESS:  A 55 year old white male with no prior  history of coronary artery disease who presented to the [facility]  Emergency Room on June 03, 2006, with complaints of sharp,  intermittent, brief, sharp, chest pains with associated diaphoresis and  occasional radiation to the left arm lasting 5 seconds at a time.  On  the day of admission, symptoms occurred over a longer period of time and  he was admitted and ruled out for MI.  Following discharge, he underwent  adenosine Myoview on June 08, 2006, which showed probable soft tissue  attenuation, although mild apical ischemia could not be excluded and  overall the study was felt to be low risk.  Interestingly, EF was  calculated at 49%, although it was noted to visually appear better.  It  was recommended that he undergo a 2D echocardiogram to further evaluate.  The patient has not had any recurrent chest pain and  denies any PND,  orthopnea, dizziness, syncope, edema, early satiety, or dyspnea on  exertion.   ALLERGIES:  MORPHINE.   CURRENT MEDICATIONS:  1. Atenolol 100 mg every day.  2. Lipitor 40 mg every day.  3. Flomax 0.4 mg every day.  4. Morphine tablets 60 mg t.i.d.  5. Cymbalta 60 mg every day.  6. Benicar 20/12.5 mg every day.  7. Oxycodone p.r.n.   PHYSICAL EXAMINATION:  VITAL SIGNS:  Blood pressure 118/76, heart rate  68, respirations 16.  His weight is 259 pounds.  GENERAL:  A pleasant white male in no acute distress.  Awake, alert, and  oriented x3.  NECK:  Normal carotid upstrokes.  No bruits or JVD.  LUNGS:  Respirations regular unlabored.  Clear to auscultation.  CARDIAC:  Regular S1 S2.  No S3, S4, murmurs.  ABDOMEN:  Round, soft, nontender, nondistended.  Bowel sounds present  x4.  EXTREMITIES:  Warm  and dry, pink.  No clubbing, cyanosis, or edema.  Dorsalis pedis, posterior tibial pulses 2+ and equal bilaterally.   ACCESSORY CLINICAL FINDINGS:  Myoview results as outlined above.   ASSESSMENT/PLAN:  1. Chest pain, fairly atypical.  He has had no recurrence.  He had a      low risk Myoview, although there was a question of a      cardiomyopathy.  At the recommendation of Dr. Jens Som, who read      the study, we will obtain a 2D echocardiogram to further evaluate      left ventricular function. Presuming that this is normal, the      patient will likely not require additional cardiac evaluation.  2. Hypertension.  This is reasonably well controlled on beta-blocker,      ARB, and HCTZ therapy.  3. Benign prostatic hypertrophy is followed by primary care and is      currently on Flomax.  4. Chronic pain.  He is on morphine, Cymbalta, and as needed      oxycodone, managed by primary care.  5. Obstructive sleep apnea.  He has been seen in the sleep medicine      clinic by Dr. Shelle Iron and is otherwise seen by Dr. Sherene Sires.  He remains      on CPAP.   DISPOSITION:  Followup  2D echocardiogram and follow up with Dr. Eden Emms  in approximately 1 month following that study.      Nicolasa Ducking, ANP  Electronically Signed      Everardo Beals. Juanda Chance, MD, [facility]  Electronically Signed   CB/MedQ  DD: 06/12/2006  DT: 06/12/2006  Job #: [id number]

## 2010-10-08 NOTE — Op Note (Signed)
NAME:  Jared [name], Jared [name]                           ACCOUNT NO.:  [id number]   MEDICAL RECORD NO.:  [id number]                   PATIENT TYPE:  INP   LOCATION:  5005                                 FACILITY:  [facility]   PHYSICIAN:  Mark C. Ophelia Charter, M.D.                 DATE OF BIRTH:  1955/06/23   DATE OF PROCEDURE:  06/24/2002  DATE OF DISCHARGE:                                 OPERATIVE REPORT   PREOPERATIVE DIAGNOSIS:  C4-5 right paracentral herniated nucleus pulposus  with radiculopathy.   POSTOPERATIVE DIAGNOSIS:  C4-5 right paracentral herniated nucleus pulposus  with radiculopathy.   PROCEDURE:  C4-5 anterior cervical diskectomy and fusion, left iliac crest  bone graft.   SURGEON:  Mark C. Ophelia Charter, M.D.   ASSISTANT:  Genene Churn. Owens, P.A.-C.   ANESTHESIA:  GOT.   ESTIMATED BLOOD LOSS:  Less than  100 cc.   DESCRIPTION OF PROCEDURE:  After the induction of general anesthesia with  endotracheal intubation, head halter traction, sandbag behind the neck and  gel bag underneath the left iliac crest, the left anterior iliac crest and  the neck were prepped with Duraprep. Preoperative Ancef was given  prophylactically. Both areas were scrubbed with towels. A sterile skin  marker was used on the neck along the prominent skin fold and the a Vidrape  was used to apply to the neck, a half sheet in between, a thyroid sheet with  a sterile Mayo stand at the head.   An incision was made at the midline and extended to the left. A prominent  skin crease and platysma were elevated primarily cephalad, since the primary  crease was at the C5-6 interspace by landmark palpation. The needle was  placed once the longus coli was split from the midline and the needle was at  C5-6. The level above C4-5 was opened with a scalpel blade and a chunk of  the disk was removed for identification, and then the self-retaining Cloward  retractors were placed with teeth blades right and left, smooth blades up  and down.   After diskectomy with a #15 scalpel blade and both gutters were stripped  with a Cloward curet, a 12-mm Cloward drill was used to drill centered over  the disk space and progressing down to the posterior cortex, removing the  bone with micro pituitary, irrigation and stripping the gutters the Cloward  curet  sequentially.   Once the posterior cortex was exposed, the operative microscope was draped  and brought into the field and the posterior longitudinal ligament was taken  down and there was an extruded, firm fragment of disk that was present  central on the right and came out as one large chunk. Directly underneath  this was dura, and the remaining portion of the ligament was removed right  and left side to completely expose the dura. Spurs were removed from the  right and  left side after irrigation.   A 14-mm plug was harvested from the left iliac crest splitting the fibers in  line with the fascia. The plug disengaged from the plug cutter and had to be  manually pulled out with a Kocher after a nerve hook was used to slide in  between the graft and the pelvis to tease a corner of out so it could be  grasped with a Market researcher.   The depth gauge was used. The tip of the plug was bullet-nosed. The plug  holder was used and it was impacted into place, countersunk 1 mm and was  securely fit and would not move with neck rotation. The halter traction was  pulled by CRNA while the plug was inserted and then relaxed. A Hemovac drain  was placed with an in-out technique in line with the skin incision.   The platysma was closed with 3-0 Vicryl, 4-0 Vicryl and a subcuticular skin  closure, tincture of Benzoin and Steri-Strips,  4 x 4s, tape and a cervical collar. The iliac crest was closed with 0 Vicryl  reapproximating the fascia, 2-0 Vicryl in the subcutaneous tissues and skin  staple closure, postoperative dressing. The patient was transferred to the  recovery room neurologically  intact.                                               Mark C. Ophelia Charter, M.D.    MCY/MEDQ  D:  06/24/2002  T:  06/24/2002  Job:  [id number]

## 2010-10-08 NOTE — Op Note (Signed)
NAME:  Jared [name], Jared [name]                 ACCOUNT NO.:  [id number]   MEDICAL RECORD NO.:  [id number]          PATIENT TYPE:  AMB   LOCATION:  DSC                          FACILITY:  [facility]   PHYSICIAN:  Thornton Park. Daphine Deutscher, MD  DATE OF BIRTH:  1956-05-08   DATE OF PROCEDURE:  07/07/2006  DATE OF DISCHARGE:                               OPERATIVE REPORT   PREOPERATIVE DIAGNOSIS:  Umbilical hernia.   POSTOPERATIVE DIAGNOSIS:  Incarcerated umbilical hernia containing  preperitoneal fat.   SURGEON:  Thornton Park. Daphine Deutscher, MD   ANESTHESIA:  General endotracheal.   DESCRIPTION OF PROCEDURE:  The patient was taken to room #2 at [facility] and the umbilical region was clipped and then prepped with  Techni-Care and draped sterilely.  A curvilinear incision was made  beneath the umbilicus and the hernia was elevated off the skin flap of  the umbilicus.  About a 3 cm in diameter preperitoneal fatty hernia was  seen coming through about a 1 cm defect in the fascia.  This was able to  be reduced in the prefascial region.  The fascial defect was then  repaired putting a piece of mesh cut to fit and slipped beneath the  small fascial ring, incorporating it into the closure with interrupted 2-  0 Prolene.  The area was injected with some Marcaine.  The umbilical  skin was then tacked down to the fascia with 4-0 Vicryl.  The skin was  closed with 4-0 Vicryl and with Dermabond.  A sterile dressing was  applied.  The patient was taken to the recovery room in satisfactory  condition.  He is told to take Tylenol in addition to the oxycodone that  he already takes for chronic back pain.      Thornton Park Daphine Deutscher, MD  Electronically Signed     MBM/MEDQ  D:  07/07/2006  T:  07/07/2006  Job:  [id number]

## 2010-10-08 NOTE — Consult Note (Signed)
[facility]  Patient:    Jared [name], Jared [name]                        MRN: [id number] Proc. Date: 11/05/99 Adm. Date:  [id number] Attending:  Thyra Breed CC:         Marinus Maw, M.D.                          Consultation Report  NEW PATIENT EVALUATION  HISTORY OF PRESENT ILLNESS:  The patient is a 55 year old gentleman who is sent to [place] by Dr. Marinus Maw for evaluation and treatment of chronic low back pain and S1 radiculopathy.  The patient was in his usual state of health up until 1992, when he developed low back pain after injuring his back.  He was walking his dog and apparently his dog pulled him and strained his back.  He was initially treated by Dr. Colon Flattery. [facility] with physical therapy, analgesic and nonsteroidals.  An MRI was performed in 1994 which demonstrated a herniated disc.  He underwent a diskectomy.  Six to eight weeks postop, he developed lower back pain radiating out into his left lower extremity.  He underwent epidural steroid injections x 3 series every six months, up until 1996.  At that time, he was sent to [facility] for pain management by Dr. Farris Has.  He did not improve.  He was treated with Darvocet-N.  His primary care physician, Dr. Oneta Rack, had him evaluated at the [facility] in [place] and they had no recommendations for him.  Dr. Hewitt Shorts had seen him and had little to add.  The patient has been treated with periodic dosing of Darvocet-N, which is not very helpful in controlling his pain, Percocet, which partially relieves his pain, and hydrocodone, which also partially relieves his pain.  He is currently taking about eight tablets of hydrocodone 7.5/750 mg per day.  He describes his pain as a constant sharp discomfort which has lightning qualities to it.  His pain is increased by lifting, prolonged sitting or standing or walking or intercourse.  His pain is improved  by figure-of-four positioning or hydrocodone.  He also, of note, has had a visit to the emergency room where he received morphine and this significantly reduced his pain.  He complains of numbness and tingling out into the left lower extremity and he does have some weakness of his left foot.  He has intact bowel and bladder control, but he has some hesitancy on urination and an erectile dysfunction.  CURRENT MEDICATIONS: 1. Verapamil 240 mg per day. 2. Atenolol 100 mg per day. 3. Lipitor 40 mg per day. 4. Effexor 225 mg a day. 5. Stadol spray. 6. Imitrex.  ALLERGIES:  NEURONTIN, characterized by swelling.  FAMILY HISTORY:  Positive for coronary artery disease and hypertension.  PAST SURGICAL HISTORY:  Significant for his back surgery alone.  SOCIAL HISTORY:  The patient is a nonsmoker.  He drinks about three drinks per week.  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  ACTIVITY MEDICAL PROBLEMS:  Hypertension, migraine headaches and varicose veins.  REVIEW OF SYSTEMS:  GENERAL:  Negative.  HEENT:  Head negative except for migraines.  Eyes negative.  Nose, mouth and throat negative.  Ears negative. Oropharynx is free of symptoms.  LUNGS:  Negative.  HEART:  Negative.  GI: Negative.  GU:  Significant for urinary  hesitancy and erectile dysfunction, which he states is from taking his opiates postoperatively in 1994. ENDOCRINE:  Negative.  CUTANEOUS:  Negative.  HEMATOLOGIC:  Negative. PSYCHIATRIC:  Negative.  ALLERGY/IMMUNOLOGIC:  Positive for hayfever; he occasionally takes a Claritin.  PHYSICAL EXAMINATION:  VITAL SIGNS:  Blood pressure is 130/56, heart rate 60, respiratory rate is 18, O2 saturation is 96%, pain level is 5/10 and temperature is 97.5.  GENERAL:  This is a tall male who has obvious discomfort and is unable to sit in one position for prolonged periods of time.  HEENT:  Head was normocephalic, atraumatic.  Eyes:  Extraocular movements intact with  conjunctivae and sclerae clear.  Nose:  Patent nares.  Oropharynx is free of lesions.  NECK:  Supple without lymphadenopathy.  Carotids are 2+ and symmetric without bruits.  LUNGS:  Clear.  HEART:  Regular rate and rhythm.  ABDOMEN:  Bowel sounds present.  GENITALIA:  Exam was not performed.  RECTAL:  Exam was not performed.  BACK:  Exam revealed well-healed surgical scar with no tenderness to percussion over the vertebrae or SI joint tenderness.  EXTREMITIES:  Radial pulses were 2+ and symmetrical as well as dorsalis pedis pulses.  He exhibited varicosities over the feet bilaterally.  NEUROLOGIC:  The patient was oriented x 4.  Cranial nerves II-XII were grossly intact.  Deep tendon reflexes were symmetric in the upper extremities and at the knees with 2+ at the right ankle; absent left ankle jerk.  Plantarflexion of the left great toe showed 3.5/5 weakness.  He was unable to stand on his toes without eliciting pain.  He has symmetric bulk and tone.  Coordination was grossly intact.  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  The patient had an S1 nerve root block which apparently elicited so much discomfort he could not recall if it was beneficial or not.  He apparently did not develop a radicular anesthesia associated with this.  IMPRESSION: 1. Low back pain with chronic S1 radiculopathy with epidural scarring. 2. Hypertension, per Dr. Oneta Rack. 3. History of loud snoring and what sounds like some periodic apnea, which I    have encouraged the patients wife to discuss with Dr. Oneta Rack. 4. Migraine headaches, per Dr. Clabe Seal. Adelman. 5. Varicose veins. 6. Hayfever.  DISPOSITION: 1. I advised the patient that we could treat him with chronic opiates in    combination with tricyclic antidepressants but wish to  start with the    opiates first.  We discussed our options of methadone versus     MS Contin/Kadian versus OxyContin.  He has had a nice response to IV    morphine, so I have recommended that we go ahead and convert him over to    Kadian 30 mg one p.o. b.i.d., #60 with no refill, and MSIR 15 mg one p.o.    q.6h. p.r.n., #50, for breakthrough pain. 2. I will start him amitriptyline at his next visit, if he is tolerating the    Kadian well.  I am concerned about his urinary retention that he is already    suffering from and will discuss this with him at his next visit. 3. The patient was advised that he should not use Stadol with chronic opiates,    as they may cause acute reversal and withdrawal.  He plans not to use this. 4.  Follow up with me in four weeks. 5. The patient may benefit from another series of epidural steroid injections    or selective nerve root block without starting out with a provocative test. 6. The patient was advised that he may have some sleep apnea which could    potentially get worse with the opiate therapy.  He was encouraged to follow    up with Dr. Oneta Rack with regard to this. DD:  11/05/99 TD:  11/06/99 Job: [id number]

## 2010-10-08 NOTE — Op Note (Signed)
Jared [name], Jared [name]                 ACCOUNT NO.:  [id number]   MEDICAL RECORD NO.:  [id number]          PATIENT TYPE:  INP   LOCATION:  1420                         FACILITY:  [facility]   PHYSICIAN:  Claudette Laws, M.D.  DATE OF BIRTH:  November 27, 1955   DATE OF PROCEDURE:  02/02/2006  DATE OF DISCHARGE:                                 OPERATIVE REPORT   PREOPERATIVE DIAGNOSIS:  Benign prostatic hypertrophy with acute urinary  retention.   POSTOPERATIVE DIAGNOSIS:  Benign prostatic hypertrophy with acute urinary  retention.   OPERATIONS:  Cystoscopy and transurethral resection of prostate gland.   SURGEON:  Claudette Laws, M.D.   HISTORY:  This is a 55 year old man who I have been following for several  months with BPH symptoms.  He has been on Avodart as well as UroXatral, the  alpha blocker, and then week ago he developed acute urinary retention.  He  has a several hundred cc residual.  He came into my office last week with an  indwelling Foley catheter.  We discussed treatment options and he desired to  go ahead with a definitive treatment.  Since he was in acute retention, I  did not offer him any type of minimally invasive surgery such as a microwave  as we do not feel that is as effective as a regular TUR of the prostate.  I  explained the procedure to him including postop complications such as postop  bleeding, infection, urinary incontinence.  Also he will have retrograde  ejaculation.  He understands and he desired to proceed on a timely basis.  He was given IV gentamycin preop.   PROCEDURE:  The patient was prepped and draped in the dorsal lithotomy  position under LMA anesthesia.  We removed his Foley catheter and cystoscopy  with a 22-French rigid scope showed a normal anterior urethra.  He had  rather prominent lateral lobes a slight elongation of the prostatic urethra,  some elevation of the posterior lip, a prominent veru.  The bladder itself  was grossly normal,  smooth, no tumors, no calculi, normal ureteral orifices.  Evidence of catheter cystitis at the dome.   We then dilated him with Sissy Hoff sounds to #30-French and then placed a 28-  [place] continuous flow resectoscope sheath.  Then using the camera,  initially I resected out the floor of the prostate from the 5 o'clock to 7  o'clock position back to the veru.  We trimmed the bladder neck flush with  the trigone.  Then the right lobe was resected in a clockwise fashion, left  lobe was resected in the counterclockwise fashion down to the capsule.  There was some tissue anteriorly which we resected out.  At the conclusion  he appeared to have a well resected fossa.  I fulgurated any bleeders with  minimal bleeding throughout.  I flushed out the chips and then put in a 24-  [place] 30 mL Ainsworth catheter, hooked it to straight drain and irrigated  clear and then he was taken back to the recovery room in satisfactory  condition.  Claudette Laws, M.D.  Electronically Signed    RFS/MEDQ  D:  02/02/2006  T:  02/03/2006  Job:  [id number]

## 2010-10-08 NOTE — H&P (Signed)
NAME:  IVIN, ROSENBLOOM NO.:  [id number]   MEDICAL RECORD NO.:  [id number]          PATIENT TYPE:  INP   LOCATION:  0102                         [facility]AC[facility]   PHYSICIAN:  Lowella Bandy, MD      DATE OF BIRTH:  1955-09-09   DATE OF ADMISSION:  06/03/2006  DATE OF DISCHARGE:                              HISTORY & PHYSICAL   PRIMARY CARE PHYSICIAN:  Lucky Cowboy, M.D.   CARDIOLOGIST:  Someone at [facility], last seen over 10 years ago.   CHIEF COMPLAINT:  Chest pain.   HISTORY OF PRESENT ILLNESS:  Mr. Middlesworth is a 55 year old Caucasian male  with cardiac risk factors of hypertension, hyperlipidemia, and a former  smoker who is disabled due to chronic neck and back pain who has had  several episodes of sharp left sided chest pain over the past few days.  He reports that the pain lasts a few seconds and often seems to be  provoked by stress or anxiety, and he cannot relate it to any exertion.  The pain starts under the left side of his chest and often moves up into  his neck.  He reports that these episodes cause him additional anxiety.  He does not exercise regularly due to his chronic spine problems and  chronic pain issues.  He reports that he had some type of stress test  approximately 12 years ago by [facility], but I cannot locate  these records at this time.  He is currently chest pain free and has not  required any nitroglycerin in the emergency department.   PAST MEDICAL HISTORY:  1. Hypertension.  2. Hyperlipidemia.  3. Obstructive sleep apnea on BiPAP, inspiratory pressures of 12,      expiratory pressures of 8.  4. Status post multiple neck and back spinal surgeries for severe      degenerative disk disease.  The patient is on disability.  5. BPH, status post TURP procedure in October 2007.   MEDICATIONS:  1. Atenolol 100 mg p.o. once a day.  2. Benicar/HCT 40/25, one tablet once a day.  3. Lipitor 20 mg p.o. once a day.  4. Fish  oil.  5. Potassium chloride 20 mEq p.o. once a day.  6. Cymbalta 60 mg p.o. once a day.  7. Oxycodone extended release 80 mg p.o. t.i.d.   ALLERGIES:  MORPHINE.   SOCIAL HISTORY:  The patient quit smoking approximately 3 years ago, had  approximately 25 pack-year history previous to that time.  He drinks  alcohol occasionally.  He denies any illicit drug use.  He is on  disability due to his chronic spine problems.   FAMILY HISTORY:  No known coronary artery disease.  Mother is living at  age 61.  Father living at age 23.   REVIEW OF SYSTEMS:  Positive for chest pain as reported above in the  HPI.  Also positive for chronic neck and back pain as well as anxiety.  Otherwise 10 systems reviewed and otherwise negative other than as noted  above in the HPI.   PHYSICAL EXAMINATION:  VITAL SIGNS:  Blood pressure 109/57, pulse 56,  respiratory rate 12, oxygen saturation 96% on room air.  GENERAL:  The patient is breathing comfortably in no apparent distress.  HEENT:  Normocephalic atraumatic.  Sclerae are anicteric.  Extraocular  movements intact.  NECK:  Supple.  No carotid bruits or JVD.  CARDIOVASCULAR:  Regular rate and rhythm.  Normal S1 and S2.  No  murmurs, rubs, or gallops.  CHEST:  Clear to auscultation bilaterally.  ABDOMEN:  Soft, obese, nontender, nondistended.  EXTREMITIES:  Warm, no edema.  SKIN:  No rash.  MUSCULOSKELETAL:  No joint effusions.  NEUROLOGIC:  Alert and oriented  x3.  Cranial nerves II-XII grossly  intact.  Moving all extremities.  PSYCHIATRIC:  Affect appropriate.  Alert and oriented.   EKG shows a normal sinus rhythm at 60 beats per minute with borderline  voltage criteria for LVH, no ST-T wave abnormalities.   LABORATORY VALUES:  White blood cell count 6, hemoglobin 14.5,  hematocrit 41, platelets 229.  Sodium 141, potassium 4.2, chloride 104,  bicarb 30, BUN 11, creatinine 1.1, glucose 95.  Troponin less than 0.05,  CK-MB 3.   ASSESSMENT:  A  55 year old male with some cardiac risk factors  (hypertension, hyperlipidemia, former smoker) as well as chronic spine  and disk problems who presents with chest pain that is very atypical for  myocardial ischemia.  His EKG is unremarkable, and his initial cardiac  biomarkers are undetectable.  He is low risk for acute coronary  syndrome.   PLAN:  1. We will admit him for observation to rule out myocardial      infarction.  He has been given aspirin in the emergency department      which we will continue.  There is no need for heparin therapy at      this time, unless his biomarkers begin to elevate or his EKG      changes.  2. Hypertension.  We will continue his home regimen of atenolol and      Benicar/HCT which appear to be working well.  3. Hyperlipidemia.  We will continue his Lipitor and fish oil.  4. Chronic neck and back pain.  We will continue his home dose of      oxycodone ER with breakthrough p.o. oxycodone.  We will not use any      IV narcotics.  5. If the patient rules out for myocardial infarction, he could likely      go home tomorrow, June 04, 2006, and then perhaps return for an      outpatient stress test.  6. Given that the patient is ambulatory and likely will be in for only      an observation admission, DVT prophylaxis is not needed at this      time.  If he remains longer than the initial 24-hour period, he      will need to be initiated on subcutaneous heparin therapy.      Lowella Bandy, MD  Electronically Signed     JJC/MEDQ  D:  06/03/2006  T:  06/03/2006  Job:  [id number]

## 2010-10-08 NOTE — H&P (Signed)
[facility]  Patient:    Jared [name], Jared [name]                        MRN: [id number] Adm. Date:  [id number] Attending:  Thyra Breed                         History and Physical  HISTORY OF PRESENT ILLNESWS:  Ladavion has had a rather rough time and he does rate his pain at 4/10, but in the interim we had to put him on a Dosepak and he is up to OxyContin 40 mg three times a day.  He feels as though he breaks through at the seventh hour.  He is also taking Allegra in addition to his Lipitor, Effexor, Valium, verapamil, and atenolol.  He does itch occasionally with the OxyContin.  PHYSICAL EXAMINATION:  VITAL SIGNS:  Blood pressure 127/83, heart rate 58, respiratory rate 16, O2 saturation 97%, pain level is 4/10.  EXTREMITIES:  Deep tendon reflexes are symmetric at the knees.  Left ankle jerk was 0 to 1+.  Right was 2+.  Straight leg raise signs are unchanged from previously.  IMPRESSION: 1. Low back pain with chronic S1 radiculopathy. 2. Itching from OxyContin. 3. Other medical problems per Dr. Mc______.  DISPOSITION: 1. Continue on OxyContin 40 mg one p.o. t.i.d. #90 with no refill. 2. Allegra 60 mg one p.o. q.d. p.r.n. #60 with one refill. 3. IM Medrol 80 mg today to be administered by the nurse. 4. Follow up with me in four weeks. 5. Vioxx 25 mg one p.o. q.d., #30 with two refills.  I advised him that we    would see if this would help to hold things under better control rather    than give him repeated doses of cortisone. DD:  12/31/99 TD:  12/31/99 Job: [id number]

## 2010-10-08 NOTE — Op Note (Signed)
NAME:  Jared [name], Jared [name]                           ACCOUNT NO.:  [id number]   MEDICAL RECORD NO.:  [id number]                   PATIENT TYPE:  OIB   LOCATION:  2550                                 FACILITY:  [facility]   PHYSICIAN:  Mark C. Ophelia Charter, M.D.                 DATE OF BIRTH:  1955/09/04   DATE OF PROCEDURE:  06/18/2003  DATE OF DISCHARGE:                                 OPERATIVE REPORT   PREOPERATIVE DIAGNOSIS:  Cervical spondylosis C5-C6 and C6-C7.   POSTOPERATIVE DIAGNOSIS:  Cervical spondylosis C5-C6 and C6-C7.   PROCEDURE:  C5-C6 and C6-C7 anterior cervical discectomy and fusion with  allograft and plating, microscope assisted.   SURGEON:  Mark C. Ophelia Charter, M.D.   ASSISTANTLynford Citizen, R.N.   ANESTHESIA:  GOT plus Marcaine skin local.   DRAINS:  One Hemovac neck.   PROCEDURE:  After induction of general anesthesia and oral endotracheal  intubation, after application of head halter traction, sandbag behind the  neck, the neck was prepped with DuraPrep, the area squared with towels, a  sterile skin marker at the old incision, Betadine, and Vi-Drape application,  laminectomy sheets, sterile Mayo stand at the head, C-arm draped underneath  for lateral.  The old scar was opened.  The subcutaneous tissue was sharply  divided.  The platysma was divided.  Blunt dissection was performed and the  omohyoid muscle was divided for exposure of both C5-C6 and C6-C7 which is  inferior to the previous fusion.  Cross table lateral C-arm demonstrated the  needle was at C6-C7.  Discectomy was performed at 5-6 and 6-7, bur was used  to enlarge it.  Anterior spurs were removed.  Cloward curets were used to  strip the gutters, remove the disc space, pituitary and micropituitary was  used.  The operating microscope was draped and brought in.  The posterior  longitudinal ligament was taken down.  The spurs were more prominent at C6-  C7 with narrowing and posterior longitudinal ligament was  completely removed  for exposure of the cord.  Bone was left to prevent posterior migration of  the plug.  Sizers were used, a 6 would fit, a 7 would not quite fit in, and  a 7 lordotic graft was used at both levels.  C-arm was used to check and the  grafts were impacted slightly more posteriorly in good position.  A 37 mm  Synthes plate was selected after both levels had been decompressed with the  microscope, gutters had been stripped, 1 and 2 mm Kerrisons had been used to  remove bone off the uncovertebral joints in the gutters.  There was egress  around the side of each graft for fluid.  A 37 mm Synthes plate was  selected, the plate was applied.  Screws were drilled, 16 mm checked under  fluoroscopy, and then the second screw was placed.  There was good  stability.  After irrigation with saline solution, a Hemovac was placed  through a separate stab incision with in and out technique.  The platysma  was closed with 3-0 Vicryl, 4-0 Vicryl subcuticular skin closure.  Marcaine  infiltration.  Tincture of Benzoin, Steri-Strips, 4 by 4s, tape, and soft  collar.  The patient was transported to the recovery room neurologically  intact.                                               Mark C. Ophelia Charter, M.D.   MCY/MEDQ  D:  06/18/2003  T:  06/18/2003  Job:  [id number]

## 2010-12-22 ENCOUNTER — Emergency Department (HOSPITAL_COMMUNITY): Payer: 59

## 2010-12-22 ENCOUNTER — Emergency Department (HOSPITAL_COMMUNITY)
Admission: EM | Admit: 2010-12-22 | Discharge: 2010-12-23 | Disposition: A | Discharge status: Home / Self Care | Payer: 59 | Attending: Emergency Medicine | Admitting: Emergency Medicine

## 2010-12-22 DIAGNOSIS — E7800 Pure hypercholesterolemia, unspecified: Secondary | ICD-10-CM | POA: Insufficient documentation

## 2010-12-22 DIAGNOSIS — S63279A Dislocation of unspecified interphalangeal joint of unspecified finger, initial encounter: Secondary | ICD-10-CM | POA: Insufficient documentation

## 2010-12-22 DIAGNOSIS — W19XXXA Unspecified fall, initial encounter: Secondary | ICD-10-CM | POA: Insufficient documentation

## 2010-12-22 DIAGNOSIS — I10 Essential (primary) hypertension: Secondary | ICD-10-CM | POA: Insufficient documentation

## 2011-01-19 ENCOUNTER — Encounter: Payer: Self-pay | Admitting: *Deleted

## 2011-01-19 ENCOUNTER — Encounter: Payer: Self-pay | Admitting: Cardiovascular Disease

## 2011-01-20 ENCOUNTER — Ambulatory Visit (INDEPENDENT_AMBULATORY_CARE_PROVIDER_SITE_OTHER): Payer: Medicare Other | Admitting: Cardiovascular Disease

## 2011-01-20 ENCOUNTER — Encounter: Payer: Self-pay | Admitting: Cardiovascular Disease

## 2011-01-20 DIAGNOSIS — E785 Hyperlipidemia, unspecified: Secondary | ICD-10-CM

## 2011-01-20 DIAGNOSIS — I10 Essential (primary) hypertension: Secondary | ICD-10-CM | POA: Insufficient documentation

## 2011-01-20 DIAGNOSIS — R079 Chest pain, unspecified: Secondary | ICD-10-CM

## 2011-01-20 MED ORDER — METOPROLOL SUCCINATE ER 100 MG PO TB24: 100.0000 mg | ORAL_TABLET | Freq: Every day | ORAL | Status: DC

## 2011-01-20 NOTE — Progress Notes (Signed)
55 yo previously seen by Dr Antoine Poche.  Admitted 2008 for atypical chest pain.  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  Echo with EF 49%  Long standing history of HTN and elevated lipids on Rx.  Had throat tightness recently and atenolol stopped and replaced by bystolic.  Turned out tightness from GERD but when he tried to stop bystolic he got bad headaches.  Stystolic BP sometimes 150 mmHg.  Occasional SSCP can be at rest or exertion.  Related to elevated BP.  No dyspnea, palpitations or syncope.  ECG abnormal Reviewed PVC and LVH no definite ischemia.    ROS: Denies fever, malais, weight loss, blurry vision, decreased visual acuity, cough, sputum, SOB, hemoptysis, pleuritic pain, palpitaitons, heartburn, abdominal pain, melena, lower extremity edema, claudication, or rash.  All other systems reviewed and negative   General: Affect appropriate Healthy:  appears stated age HEENT: normal Neck supple with no adenopathy JVP normal no bruits no thyromegaly Lungs clear with no wheezing and good diaphragmatic motion Heart:  S1/S2 no murmur,rub, gallop or click PMI normal Abdomen: benighn, BS positve, no tenderness, no AAA no bruit.  No HSM or HJR Distal pulses intact with no bruits No edema Neuro non-focal Skin warm and dry No muscular weakness  Medications No current outpatient prescriptions on file.    Allergies Review of patient's allergies indicates not on file.  Family History: No family history on file.  Social History: History   Social History  . Marital Status: Married    Spouse Name: N/A    Number of Children: N/A  . Years of Education: N/A   Occupational History  . Not on file.   Social History Main Topics  . Smoking status: Not on file  . Smokeless tobacco: Not on file  . Alcohol Use: Not on file  . Drug Use: Not on file  . Sexually Active: Not on file   Other Topics Concern  . Not on file   Social History Narrative  . No narrative on file    Electrocardiogram:   NSR LVH PVC compared to 6/10 decreased HR and PVC new  Assessment and Plan

## 2011-01-20 NOTE — Assessment & Plan Note (Signed)
Cholesterol is at goal.  Continue current dose of statin and diet Rx.  No myalgias or side effects.  F/U  LFT's in 6 months. No results found for this basename: LDLCALC             

## 2011-01-20 NOTE — Assessment & Plan Note (Signed)
CRF HTN, and lipids abnormal ECG Unable to walk due to back and neck pain.  Lexiscan myovue

## 2011-01-20 NOTE — Patient Instructions (Addendum)
Your physician recommends that you schedule a follow-up appointment in: 8-10 WEEKS  Your physician has requested that you have an echocardiogram. Echocardiography is a painless test that uses sound waves to create images of your heart. It provides your doctor with information about the size and shape of your heart and how well your heart's chambers and valves are working. This procedure takes approximately one hour. There are no restrictions for this procedure.   Your physician has requested that you have a lexiscan myoview. For further information please visit [email or web address] Please follow instruction sheet, as given.     STOP ATENOLOL  START METOPROLOL SUCC 100 MG ONCE DAILY

## 2011-01-20 NOTE — Assessment & Plan Note (Signed)
Stop bystolic and atenolol due to side effects and ineffectiveness  Toprol.  Check echo if EF not normal would benefit from ACe

## 2011-02-08 ENCOUNTER — Ambulatory Visit (HOSPITAL_COMMUNITY): Payer: 59 | Attending: Cardiovascular Disease | Admitting: Radiology

## 2011-02-08 VITALS — Ht 76.0 in | Wt 280.0 lb

## 2011-02-08 DIAGNOSIS — I059 Rheumatic mitral valve disease, unspecified: Secondary | ICD-10-CM | POA: Insufficient documentation

## 2011-02-08 DIAGNOSIS — E785 Hyperlipidemia, unspecified: Secondary | ICD-10-CM | POA: Insufficient documentation

## 2011-02-08 DIAGNOSIS — R079 Chest pain, unspecified: Secondary | ICD-10-CM | POA: Insufficient documentation

## 2011-02-08 DIAGNOSIS — R072 Precordial pain: Secondary | ICD-10-CM | POA: Insufficient documentation

## 2011-02-08 DIAGNOSIS — I10 Essential (primary) hypertension: Secondary | ICD-10-CM | POA: Insufficient documentation

## 2011-02-08 DIAGNOSIS — I079 Rheumatic tricuspid valve disease, unspecified: Secondary | ICD-10-CM | POA: Insufficient documentation

## 2011-02-08 MED ORDER — TECHNETIUM TC 99M TETROFOSMIN IV KIT
11.0000 | PACK | Freq: Once | INTRAVENOUS | Status: AC | PRN
  Administered 2011-02-08: 11 via INTRAVENOUS

## 2011-02-08 MED ORDER — REGADENOSON 0.4 MG/5ML IV SOLN
0.4000 mg | Freq: Once | INTRAVENOUS | Status: AC
  Administered 2011-02-08: 0.4 mg via INTRAVENOUS

## 2011-02-08 MED ORDER — TECHNETIUM TC 99M TETROFOSMIN IV KIT
33.0000 | PACK | Freq: Once | INTRAVENOUS | Status: AC | PRN
  Administered 2011-02-08: 33 via INTRAVENOUS

## 2011-02-08 NOTE — Progress Notes (Signed)
[facility] 3 NUCLEAR MED [address] [place] [address] [phone]  Cardiology Nuclear Med Study  Jared [name] is a 55 y.o. male [id number] 10/29/1955   Nuclear Med Background Indication for Stress Test:  Evaluation for Ischemia and Abnormal EKG:LVH, new PVC's History:  No previous documented CAD, ' 08 Echo: EF=55-60% and '08  Myocardial Perfusion Study: ? Mild apical ischemia, EF=49%. Cardiac Risk Factors: History of Smoking, Hypertension and Lipids  Symptoms:  Chest Pain (last date of chest discomfort 5 days ago)   Nuclear Pre-Procedure Caffeine/Decaff Intake:  None NPO After: 10:00pm   Lungs:  Clear IV 0.9% NS with Angio Cath:  20g  IV Site: R Antecubital  IV Started by:  Stanton Kidney, EMT-P  Chest Size (in):  48 Cup Size: n/a  Height: 6\' 4"  (1.93 m)  Weight:  280 lb (127.007 kg)  BMI:  Body mass index is 34.08 kg/(m^2). Tech Comments:  Toprol taken at 7:30am today    Nuclear Med Study 1 or 2 day study: 1 day  Stress Test Type:  Treadmill/Lexiscan  Reading MD: Kristeen Miss, MD  Order Authorizing Provider:  Charlton Haws, MD  Resting Radionuclide: Technetium 43m Tetrofosmin  Resting Radionuclide Dose: 11.0 mCi   Stress Radionuclide:  Technetium 19m Tetrofosmin  Stress Radionuclide Dose: 33.0 mCi           Stress Protocol Rest HR: 62 Stress HR: 90  Rest BP: 110/74 Stress BP: 129/74  Exercise Time (min): 2:00 METS: n/a   Predicted Max HR: 163 bpm % Max HR: 55.21 bpm Rate Pressure Product: 78469   Dose of Adenosine (mg):  n/a Dose of Lexiscan: 0.4 mg  Dose of Atropine (mg): n/a Dose of Dobutamine: n/a mcg/kg/min (at max HR)  Stress Test Technologist: Irean Hong, RN  Nuclear Technologist:  Domenic Polite, CNMT     Rest Procedure:  Myocardial perfusion imaging was performed at rest 45 minutes following the intravenous administration of Technetium 75m Tetrofosmin. Rest ECG: NSR with early replorization, poor R wave  progression  Stress Procedure:  The patient received IV Lexiscan 0.4 mg over 15-seconds with concurrent low level exercise.  Technetium 23m Tetrofosmin was injected at 30-seconds while the patient continued walking.   There were no significant changes with Lexiscan.There were occasional PVC's.  Quantitative spect images were obtained after a 45-minute delay. Stress ECG: No significant change from baseline ECG  QPS Raw Data Images:  Normal; no motion artifact; normal heart/lung ratio. Stress Images:  Normal homogeneous uptake in all areas of the myocardium. Rest Images:  Normal homogeneous uptake in all areas of the myocardium. Subtraction (SDS):  Normal Transient Ischemic Dilatation (Normal <1.22):  0.98 Lung/Heart Ratio (Normal <0.45):  0.30  Quantitative Gated Spect Images QGS EDV:  185 ml QGS ESV:  102 ml QGS cine images:  The LV is moderately dilated.  The LV function is mildly depressed. QGS EF: 46%  Impression Exercise Capacity:  Lexiscan with low level exercise. BP Response:  Normal blood pressure response. Clinical Symptoms:  No chest pain. ECG Impression:  No significant ST segment change suggestive of ischemia. Comparison with Prior Nuclear Study: No significant change from previous study  Overall Impression:  Normal stress nuclear study.  No evidence of ischemia. Normal LV function.    Jared [name], Montez Hageman., MD, [facility]

## 2011-02-17 LAB — COMPREHENSIVE METABOLIC PANEL
Albumin: 3.9
BUN: 7
Creatinine, Ser: 1.21
Total Bilirubin: 0.7
Total Protein: 6.4

## 2011-02-17 LAB — CBC
HCT: 49
MCHC: 35.5
MCV: 91.7
Platelets: 224
RDW: 12.6

## 2011-03-16 ENCOUNTER — Telehealth: Payer: Self-pay | Admitting: Cardiovascular Disease

## 2011-03-16 ENCOUNTER — Ambulatory Visit: Payer: Medicare Other | Admitting: Cardiovascular Disease

## 2011-03-16 NOTE — Telephone Encounter (Signed)
Spoke with patient and on recheck, his blood pressure 127/100 and heart rate down to 101. Only had Toprol about 50 minutes prior.  Patient wanted to speak with nurse Stanton Kidney.

## 2011-03-16 NOTE — Telephone Encounter (Signed)
Spoke with pt, his bp is 123/80 with pulse 78. Reassurance given to pt. He will cont to track his bp and heart rate and if he has problems he will let us know Deliah Goody

## 2011-03-16 NOTE — Telephone Encounter (Signed)
Pt wife calling wanting pt to see Dr. Eden Emms today. Pt had appt this morning at 11:30am, pt cancelled appt b/c pt didn't have a problem.  Last night pt HR and BP shot up. BP last night 172/99 HR 70, 154/91 HR 72  Pt went to sleep at 6:30 this morning and work up at 11:30am.    Pt BP147-99 HR 121 now

## 2011-03-16 NOTE — Telephone Encounter (Signed)
Pt said he still having BP problems please call

## 2011-03-17 ENCOUNTER — Telehealth: Payer: Self-pay | Admitting: Cardiovascular Disease

## 2011-03-17 NOTE — Telephone Encounter (Signed)
Pt called he is calling about BP 137/81 74 after taken meds 147/93 100 at 2am 131/91 87 when he got up please call

## 2011-03-17 NOTE — Telephone Encounter (Signed)
Splitting Toprol would be fine.  If he does not want to do this can change to labatolol 400mg  am and 400 mg pm

## 2011-03-17 NOTE — Telephone Encounter (Signed)
10/24---pt calling stating his BP meds not holding him long enough  And he is waking up around 2am with elevated BP(147/93--PULSE 100)  Advised to split toprol, taking 1/2 in am and 1/2 in pm--pt states he does not want to do this and would like a call from dr Eden Emms or debra to dicuss--advised i would paa message along--pt agrees--nt

## 2011-03-18 MED ORDER — LABETALOL HCL 200 MG PO TABS: ORAL_TABLET | ORAL | Status: DC

## 2011-03-18 NOTE — Telephone Encounter (Signed)
Spoke with pt, he does not want to split the toprol. He will be changed to labetalol Deliah Goody

## 2011-03-21 ENCOUNTER — Telehealth: Payer: Self-pay | Admitting: Cardiovascular Disease

## 2011-03-21 NOTE — Telephone Encounter (Signed)
Spoke with pt wife, she reports the pt sleeping a lot. He has a temp of 99.3 and alittle swelling in his fweet. She gave him a furosemide today. His bp is running 105/48, 103/58 and 94/53. He is lightheaded when he tries to get up. Pt instructed to decrease labetalol to 200 mg one tablet twice daily. they will call with further problems Deliah Goody

## 2011-03-21 NOTE — Telephone Encounter (Signed)
Pt's wife calling re pt having problem with BP

## 2011-03-23 ENCOUNTER — Ambulatory Visit (HOSPITAL_COMMUNITY)
Admission: RE | Admit: 2011-03-23 | Discharge: 2011-03-23 | Disposition: A | Discharge status: Home / Self Care | Payer: 59 | Source: Ambulatory Visit | Attending: Internal Medicine | Admitting: Internal Medicine

## 2011-03-23 ENCOUNTER — Other Ambulatory Visit (HOSPITAL_COMMUNITY): Payer: Self-pay | Admitting: Internal Medicine

## 2011-03-23 DIAGNOSIS — R05 Cough: Secondary | ICD-10-CM

## 2011-03-23 DIAGNOSIS — R062 Wheezing: Secondary | ICD-10-CM | POA: Insufficient documentation

## 2011-03-23 DIAGNOSIS — R059 Cough, unspecified: Secondary | ICD-10-CM | POA: Insufficient documentation

## 2011-03-24 ENCOUNTER — Telehealth: Payer: Self-pay | Admitting: Cardiovascular Disease

## 2011-03-24 NOTE — Telephone Encounter (Signed)
All Cardiac faxed to [facility] internal Medicine @ [phone]  03/24/11/km

## 2011-06-13 DIAGNOSIS — M961 Postlaminectomy syndrome, not elsewhere classified: Secondary | ICD-10-CM | POA: Diagnosis not present

## 2011-06-13 DIAGNOSIS — E669 Obesity, unspecified: Secondary | ICD-10-CM | POA: Diagnosis not present

## 2011-07-06 DIAGNOSIS — Z1212 Encounter for screening for malignant neoplasm of rectum: Secondary | ICD-10-CM | POA: Diagnosis not present

## 2011-07-06 DIAGNOSIS — Z125 Encounter for screening for malignant neoplasm of prostate: Secondary | ICD-10-CM | POA: Diagnosis not present

## 2011-07-06 DIAGNOSIS — E559 Vitamin D deficiency, unspecified: Secondary | ICD-10-CM | POA: Diagnosis not present

## 2011-07-06 DIAGNOSIS — Z79899 Other long term (current) drug therapy: Secondary | ICD-10-CM | POA: Diagnosis not present

## 2011-07-06 DIAGNOSIS — N529 Male erectile dysfunction, unspecified: Secondary | ICD-10-CM | POA: Diagnosis not present

## 2011-07-06 DIAGNOSIS — E782 Mixed hyperlipidemia: Secondary | ICD-10-CM | POA: Diagnosis not present

## 2011-07-06 DIAGNOSIS — R7309 Other abnormal glucose: Secondary | ICD-10-CM | POA: Diagnosis not present

## 2011-07-06 DIAGNOSIS — I10 Essential (primary) hypertension: Secondary | ICD-10-CM | POA: Diagnosis not present

## 2011-08-08 DIAGNOSIS — E669 Obesity, unspecified: Secondary | ICD-10-CM | POA: Diagnosis not present

## 2011-08-08 DIAGNOSIS — M961 Postlaminectomy syndrome, not elsewhere classified: Secondary | ICD-10-CM | POA: Diagnosis not present

## 2011-10-10 DIAGNOSIS — M961 Postlaminectomy syndrome, not elsewhere classified: Secondary | ICD-10-CM | POA: Diagnosis not present

## 2011-11-01 DIAGNOSIS — J0100 Acute maxillary sinusitis, unspecified: Secondary | ICD-10-CM | POA: Diagnosis not present

## 2011-11-16 ENCOUNTER — Other Ambulatory Visit: Payer: Self-pay | Admitting: Cardiovascular Disease

## 2011-11-16 MED ORDER — LABETALOL HCL 200 MG PO TABS: ORAL_TABLET | ORAL | Status: DC

## 2011-11-21 DIAGNOSIS — E669 Obesity, unspecified: Secondary | ICD-10-CM | POA: Diagnosis not present

## 2011-11-21 DIAGNOSIS — M961 Postlaminectomy syndrome, not elsewhere classified: Secondary | ICD-10-CM | POA: Diagnosis not present

## 2011-11-21 DIAGNOSIS — M7710 Lateral epicondylitis, unspecified elbow: Secondary | ICD-10-CM | POA: Diagnosis not present

## 2012-01-04 DIAGNOSIS — I10 Essential (primary) hypertension: Secondary | ICD-10-CM | POA: Diagnosis not present

## 2012-01-04 DIAGNOSIS — M545 Low back pain: Secondary | ICD-10-CM | POA: Diagnosis not present

## 2012-01-12 DIAGNOSIS — J0100 Acute maxillary sinusitis, unspecified: Secondary | ICD-10-CM | POA: Diagnosis not present

## 2012-01-30 DIAGNOSIS — M961 Postlaminectomy syndrome, not elsewhere classified: Secondary | ICD-10-CM | POA: Diagnosis not present

## 2012-01-30 DIAGNOSIS — E669 Obesity, unspecified: Secondary | ICD-10-CM | POA: Diagnosis not present

## 2012-02-02 DIAGNOSIS — E559 Vitamin D deficiency, unspecified: Secondary | ICD-10-CM | POA: Diagnosis not present

## 2012-02-02 DIAGNOSIS — E782 Mixed hyperlipidemia: Secondary | ICD-10-CM | POA: Diagnosis not present

## 2012-02-02 DIAGNOSIS — Z79899 Other long term (current) drug therapy: Secondary | ICD-10-CM | POA: Diagnosis not present

## 2012-02-02 DIAGNOSIS — I10 Essential (primary) hypertension: Secondary | ICD-10-CM | POA: Diagnosis not present

## 2012-02-02 DIAGNOSIS — R7309 Other abnormal glucose: Secondary | ICD-10-CM | POA: Diagnosis not present

## 2012-02-04 DIAGNOSIS — Z23 Encounter for immunization: Secondary | ICD-10-CM | POA: Diagnosis not present

## 2012-02-28 DIAGNOSIS — H5010 Unspecified exotropia: Secondary | ICD-10-CM | POA: Diagnosis not present

## 2012-03-30 DIAGNOSIS — E669 Obesity, unspecified: Secondary | ICD-10-CM | POA: Diagnosis not present

## 2012-03-30 DIAGNOSIS — M961 Postlaminectomy syndrome, not elsewhere classified: Secondary | ICD-10-CM | POA: Diagnosis not present

## 2012-04-02 DIAGNOSIS — J0100 Acute maxillary sinusitis, unspecified: Secondary | ICD-10-CM | POA: Diagnosis not present

## 2012-04-24 ENCOUNTER — Emergency Department (HOSPITAL_COMMUNITY)
Admission: EM | Admit: 2012-04-24 | Discharge: 2012-04-24 | Discharge status: Against Medical Advice | Payer: Medicare Other | Attending: Emergency Medicine | Admitting: Emergency Medicine

## 2012-04-24 ENCOUNTER — Encounter (HOSPITAL_COMMUNITY): Payer: Self-pay | Admitting: Emergency Medicine

## 2012-04-24 DIAGNOSIS — R109 Unspecified abdominal pain: Secondary | ICD-10-CM | POA: Insufficient documentation

## 2012-04-24 DIAGNOSIS — R110 Nausea: Secondary | ICD-10-CM | POA: Insufficient documentation

## 2012-04-24 DIAGNOSIS — Z87891 Personal history of nicotine dependence: Secondary | ICD-10-CM | POA: Insufficient documentation

## 2012-04-24 NOTE — ED Notes (Signed)
Pt complains of left flank pain x 2 weeks. Pt pain and nausea assoc. With the pain.

## 2012-04-24 NOTE — ED Notes (Signed)
Pt's wife states that pt will be leaving.

## 2012-04-25 DIAGNOSIS — N209 Urinary calculus, unspecified: Secondary | ICD-10-CM | POA: Diagnosis not present

## 2012-04-25 DIAGNOSIS — R109 Unspecified abdominal pain: Secondary | ICD-10-CM | POA: Diagnosis not present

## 2012-05-16 ENCOUNTER — Encounter (HOSPITAL_COMMUNITY): Payer: Self-pay

## 2012-05-16 ENCOUNTER — Emergency Department (HOSPITAL_COMMUNITY): Payer: Medicare Other

## 2012-05-16 ENCOUNTER — Emergency Department (HOSPITAL_COMMUNITY)
Admission: EM | Admit: 2012-05-16 | Discharge: 2012-05-16 | Disposition: A | Discharge status: Home / Self Care | Payer: Medicare Other | Attending: Emergency Medicine | Admitting: Emergency Medicine

## 2012-05-16 DIAGNOSIS — E785 Hyperlipidemia, unspecified: Secondary | ICD-10-CM | POA: Insufficient documentation

## 2012-05-16 DIAGNOSIS — Z87891 Personal history of nicotine dependence: Secondary | ICD-10-CM | POA: Diagnosis not present

## 2012-05-16 DIAGNOSIS — Z87448 Personal history of other diseases of urinary system: Secondary | ICD-10-CM | POA: Diagnosis not present

## 2012-05-16 DIAGNOSIS — E669 Obesity, unspecified: Secondary | ICD-10-CM | POA: Insufficient documentation

## 2012-05-16 DIAGNOSIS — M549 Dorsalgia, unspecified: Secondary | ICD-10-CM | POA: Insufficient documentation

## 2012-05-16 DIAGNOSIS — E7800 Pure hypercholesterolemia, unspecified: Secondary | ICD-10-CM | POA: Insufficient documentation

## 2012-05-16 DIAGNOSIS — R109 Unspecified abdominal pain: Secondary | ICD-10-CM | POA: Insufficient documentation

## 2012-05-16 DIAGNOSIS — Z79899 Other long term (current) drug therapy: Secondary | ICD-10-CM | POA: Diagnosis not present

## 2012-05-16 DIAGNOSIS — Z8669 Personal history of other diseases of the nervous system and sense organs: Secondary | ICD-10-CM | POA: Diagnosis not present

## 2012-05-16 DIAGNOSIS — Z8739 Personal history of other diseases of the musculoskeletal system and connective tissue: Secondary | ICD-10-CM | POA: Diagnosis not present

## 2012-05-16 DIAGNOSIS — R0602 Shortness of breath: Secondary | ICD-10-CM | POA: Diagnosis not present

## 2012-05-16 DIAGNOSIS — R079 Chest pain, unspecified: Secondary | ICD-10-CM | POA: Diagnosis not present

## 2012-05-16 DIAGNOSIS — I10 Essential (primary) hypertension: Secondary | ICD-10-CM | POA: Diagnosis not present

## 2012-05-16 LAB — CBC
MCH: 30.3 pg (ref 26.0–34.0)
MCHC: 34.7 g/dL (ref 30.0–36.0)
MCV: 87.3 fL (ref 78.0–100.0)
Platelets: 205 10*3/uL (ref 150–400)
RBC: 5.28 MIL/uL (ref 4.22–5.81)
RDW: 13.7 % (ref 11.5–15.5)

## 2012-05-16 LAB — URINALYSIS, ROUTINE W REFLEX MICROSCOPIC
Bilirubin Urine: NEGATIVE
Hgb urine dipstick: NEGATIVE
Ketones, ur: NEGATIVE mg/dL
Nitrite: NEGATIVE
Specific Gravity, Urine: 1.01 (ref 1.005–1.030)
Urobilinogen, UA: 0.2 mg/dL (ref 0.0–1.0)

## 2012-05-16 LAB — BASIC METABOLIC PANEL
Calcium: 9.8 mg/dL (ref 8.4–10.5)
Creatinine, Ser: 1.23 mg/dL (ref 0.50–1.35)
GFR calc non Af Amer: 63 mL/min — ABNORMAL LOW (ref >90)
Sodium: 141 mEq/L (ref 135–145)

## 2012-05-16 LAB — POCT I-STAT TROPONIN I: Troponin i, poc: 0 ng/mL (ref 0.00–0.08)

## 2012-05-16 MED ORDER — METHOCARBAMOL 500 MG PO TABS: 500.0000 mg | ORAL_TABLET | Freq: Two times a day (BID) | ORAL | Status: DC

## 2012-05-16 MED ORDER — HYDROMORPHONE HCL PF 1 MG/ML IJ SOLN
1.0000 mg | Freq: Once | INTRAMUSCULAR | Status: AC
  Administered 2012-05-16: 1 mg via INTRAVENOUS
  Filled 2012-05-16: qty 1

## 2012-05-16 MED ORDER — IBUPROFEN 600 MG PO TABS: 600.0000 mg | ORAL_TABLET | Freq: Four times a day (QID) | ORAL | Status: DC | PRN

## 2012-05-16 MED ORDER — KETOROLAC TROMETHAMINE 30 MG/ML IJ SOLN
30.0000 mg | Freq: Once | INTRAMUSCULAR | Status: AC
  Administered 2012-05-16: 30 mg via INTRAVENOUS
  Filled 2012-05-16: qty 1

## 2012-05-16 NOTE — ED Notes (Signed)
Patient presents with left sided/flank pain x 8 weeks. Initially thought source of pain was r/t kidney stone. Saw his urologist in which a stone has been ruled out. Patient continues to have this pain. Patient woke up around 0300 with this pain as well as left chest pain and nausea. Home BP readings 190/80, pt with hx HTN.

## 2012-05-16 NOTE — ED Provider Notes (Signed)
History     CSN: [id number]  Arrival date & time 05/16/12  9528   First MD Initiated Contact with Patient 05/16/12 [phone]      Chief Complaint  Patient presents with  . Chest Pain    (Consider location/radiation/quality/duration/timing/severity/associated sxs/prior treatment) HPI Pt with 6-8 weeks of L flank pain worsening tonight. Worse with movement and palpation. No urinary changes including hematuria. No fever. No chest pain, SOB, cough. No abd pain N/V/D/C. +chronic back pain without change Past Medical History  Diagnosis Date  . Chest pain   . HTN (hypertension)   . Hyperlipemia   . DJD (degenerative joint disease)   . BPH (benign prostatic hypertrophy)   . Obesity   . OSA (obstructive sleep apnea)   . Hypercholesteremia     Past Surgical History  Procedure Date  . Back surgery   . Prostatectomy     No family history on file.  History  Substance Use Topics  . Smoking status: Former Games developer  . Smokeless tobacco: Not on file  . Alcohol Use: No      Review of Systems  Constitutional: Negative for fever and fatigue.  Respiratory: Negative for shortness of breath.   Cardiovascular: Negative for chest pain.  Gastrointestinal: Negative for nausea, vomiting, abdominal pain, diarrhea and constipation.  Genitourinary: Positive for flank pain. Negative for dysuria and hematuria.  Musculoskeletal: Positive for myalgias and back pain. Negative for arthralgias.  Skin: Negative for pallor, rash and wound.  Neurological: Negative for dizziness, weakness, light-headedness, numbness and headaches.  All other systems reviewed and are negative.    Allergies  Morphine and related and Neurontin  Home Medications   Current Outpatient Rx  Name  Route  Sig  Dispense  Refill  . ALPRAZOLAM 0.5 MG PO TABS   Oral   Take 0.5 mg by mouth at bedtime as needed. For anxiety         . ATORVASTATIN CALCIUM 40 MG PO TABS   Oral   Take 40 mg by mouth every other day.           Marland Kitchen BISOPROLOL-HYDROCHLOROTHIAZIDE 10-6.25 MG PO TABS   Oral   Take 1 tablet by mouth daily.         Marland Kitchen VITAMIN D-3 5000 UNITS PO TABS   Oral   Take 1 tablet by mouth 3 (three) times daily.         Marland Kitchen CITALOPRAM HYDROBROMIDE 40 MG PO TABS   Oral   Take 40 mg by mouth daily.         Marland Kitchen FLAXSEED OIL PO   Oral   Take 1 tablet by mouth daily.           Marland Kitchen GLUCOSAMINE-CHONDROITIN 500-400 MG PO TABS   Oral   Take 2 tablets by mouth daily.           Marland Kitchen LOSARTAN POTASSIUM-HCTZ 100-25 MG PO TABS   Oral   Take 1 tablet by mouth daily.         Marland Kitchen FISH OIL BURP-LESS PO   Oral   Take 1 tablet by mouth daily.           . OXYCODONE HCL ER 80 MG PO TB12   Oral   Take 80 mg by mouth every 12 (twelve) hours.          . OXYCODONE-ACETAMINOPHEN 10-325 MG PO TABS   Oral   Take 1 tablet by mouth every 4 (four) hours as needed. For pain         .  TAMSULOSIN HCL 0.4 MG PO CAPS   Oral   Take 0.4 mg by mouth daily.         . IBUPROFEN 600 MG PO TABS   Oral   Take 1 tablet (600 mg total) by mouth every 6 (six) hours as needed for pain.   30 tablet   0   . METHOCARBAMOL 500 MG PO TABS   Oral   Take 1 tablet (500 mg total) by mouth 2 (two) times daily.   20 tablet   0     BP 133/63  Pulse 58  Temp 98.1 F (36.7 C) (Oral)  Resp 18  Ht 6\' 2"  (1.88 m)  Wt 285 lb (129.275 kg)  BMI 36.59 kg/m2  SpO2 96%  Physical Exam  Nursing note and vitals reviewed. Constitutional: He is oriented to person, place, and time. He appears well-developed and well-nourished. No distress.  HENT:  Head: Normocephalic and atraumatic.  Mouth/Throat: Oropharynx is clear and moist.  Eyes: EOM are normal. Pupils are equal, round, and reactive to light.  Neck: Normal range of motion. Neck supple.  Cardiovascular: Normal rate and regular rhythm.   Pulmonary/Chest: Effort normal and breath sounds normal. No respiratory distress. He has no wheezes. He has no rales. He exhibits no tenderness.   Abdominal: Soft. Bowel sounds are normal. He exhibits no distension and no mass. There is no tenderness. There is no rebound and no guarding.  Musculoskeletal: Normal range of motion. He exhibits tenderness (TTP L lateral flank. No evidence of deformity or injury). He exhibits no edema.  Neurological: He is alert and oriented to person, place, and time.  Skin: Skin is warm and dry. No rash noted. No erythema.  Psychiatric: He has a normal mood and affect. His behavior is normal.    ED Course  Procedures (including critical care time)  Labs Reviewed  BASIC METABOLIC PANEL - Abnormal; Notable for the following:    CO2 34 (*)     GFR calc non Af Amer 63 (*)     GFR calc Af Amer 73 (*)     All other components within normal limits  CBC  POCT I-STAT TROPONIN I  URINALYSIS, ROUTINE W REFLEX MICROSCOPIC   US Renal  05/16/2012  *RADIOLOGY REPORT*  Clinical Data:  Left flank pain.  RENAL/URINARY TRACT ULTRASOUND COMPLETE  Comparison:  CT of the abdomen and pelvis performed 04/25/2012  Findings:  Right Kidney:  The right kidney measures 12.4 cm in length.  The kidney demonstrates normal size, echogenicity and configuration. No significant cortical thinning is seen.  No hydronephrosis or calcification is identified.  No masses are seen.  Left Kidney:  The left kidney measures 12.9 cm in length.  The kidney demonstrates normal size, echogenicity and configuration. No significant cortical thinning is seen.  No hydronephrosis or calcification is identified.  No masses are seen.  Bladder:  The bladder is moderately distended and is unremarkable in appearance.  Hepatic cysts are again noted.  IMPRESSION:  1.  Unremarkable renal ultrasound. 2.  Hepatic cysts again noted.   Original Report Authenticated By: Tonia Ghent, M.D.    Dg Chest Port 1 View  05/16/2012  *RADIOLOGY REPORT*  Clinical Data: Chest pain and shortness of breath.  PORTABLE CHEST - 1 VIEW  Comparison: Chest radiograph performed  03/23/2011  Findings: The lungs are well-aerated and clear.  There is no evidence of focal opacification, pleural effusion or pneumothorax.  The cardiomediastinal silhouette is borderline enlarged.  No acute osseous  abnormalities are seen.  Cervical spinal fusion hardware is noted.  IMPRESSION: No acute cardiopulmonary process seen; borderline cardiomegaly.   Original Report Authenticated By: Tonia Ghent, M.D.      1. Flank pain      Date: 05/16/2012  Rate: 63  Rhythm: normal sinus rhythm  QRS Axis: normal  Intervals: normal  ST/T Wave abnormalities: normal  Conduction Disutrbances:none  Narrative Interpretation:   Old EKG Reviewed: unchanged    MDM  Chronic pain flare. Continue pain meds at home. Will add motrin and robaxin.         Loren Racer, MD 05/16/12 [phone]

## 2012-05-28 DIAGNOSIS — N529 Male erectile dysfunction, unspecified: Secondary | ICD-10-CM | POA: Diagnosis not present

## 2012-05-28 DIAGNOSIS — Z79899 Other long term (current) drug therapy: Secondary | ICD-10-CM | POA: Diagnosis not present

## 2012-05-28 DIAGNOSIS — E782 Mixed hyperlipidemia: Secondary | ICD-10-CM | POA: Diagnosis not present

## 2012-05-28 DIAGNOSIS — E559 Vitamin D deficiency, unspecified: Secondary | ICD-10-CM | POA: Diagnosis not present

## 2012-05-28 DIAGNOSIS — I10 Essential (primary) hypertension: Secondary | ICD-10-CM | POA: Diagnosis not present

## 2012-05-28 DIAGNOSIS — R7309 Other abnormal glucose: Secondary | ICD-10-CM | POA: Diagnosis not present

## 2012-06-01 DIAGNOSIS — M6250 Muscle wasting and atrophy, not elsewhere classified, unspecified site: Secondary | ICD-10-CM | POA: Diagnosis not present

## 2012-06-01 DIAGNOSIS — E669 Obesity, unspecified: Secondary | ICD-10-CM | POA: Diagnosis not present

## 2012-06-01 DIAGNOSIS — Z5181 Encounter for therapeutic drug level monitoring: Secondary | ICD-10-CM | POA: Diagnosis not present

## 2012-06-01 DIAGNOSIS — M961 Postlaminectomy syndrome, not elsewhere classified: Secondary | ICD-10-CM | POA: Diagnosis not present

## 2012-06-01 DIAGNOSIS — Z79899 Other long term (current) drug therapy: Secondary | ICD-10-CM | POA: Diagnosis not present

## 2012-06-01 DIAGNOSIS — M76899 Other specified enthesopathies of unspecified lower limb, excluding foot: Secondary | ICD-10-CM | POA: Diagnosis not present

## 2012-07-09 DIAGNOSIS — Z125 Encounter for screening for malignant neoplasm of prostate: Secondary | ICD-10-CM | POA: Diagnosis not present

## 2012-07-09 DIAGNOSIS — I10 Essential (primary) hypertension: Secondary | ICD-10-CM | POA: Diagnosis not present

## 2012-07-09 DIAGNOSIS — E782 Mixed hyperlipidemia: Secondary | ICD-10-CM | POA: Diagnosis not present

## 2012-07-09 DIAGNOSIS — Z79899 Other long term (current) drug therapy: Secondary | ICD-10-CM | POA: Diagnosis not present

## 2012-07-09 DIAGNOSIS — Z23 Encounter for immunization: Secondary | ICD-10-CM | POA: Diagnosis not present

## 2012-07-09 DIAGNOSIS — Z1212 Encounter for screening for malignant neoplasm of rectum: Secondary | ICD-10-CM | POA: Diagnosis not present

## 2012-07-09 DIAGNOSIS — E559 Vitamin D deficiency, unspecified: Secondary | ICD-10-CM | POA: Diagnosis not present

## 2012-07-09 DIAGNOSIS — N529 Male erectile dysfunction, unspecified: Secondary | ICD-10-CM | POA: Diagnosis not present

## 2012-07-09 DIAGNOSIS — R7309 Other abnormal glucose: Secondary | ICD-10-CM | POA: Diagnosis not present

## 2012-07-09 DIAGNOSIS — E538 Deficiency of other specified B group vitamins: Secondary | ICD-10-CM | POA: Diagnosis not present

## 2012-09-24 DIAGNOSIS — M961 Postlaminectomy syndrome, not elsewhere classified: Secondary | ICD-10-CM | POA: Diagnosis not present

## 2012-11-05 DIAGNOSIS — J449 Chronic obstructive pulmonary disease, unspecified: Secondary | ICD-10-CM | POA: Diagnosis not present

## 2012-11-19 DIAGNOSIS — M961 Postlaminectomy syndrome, not elsewhere classified: Secondary | ICD-10-CM | POA: Diagnosis not present

## 2012-11-19 DIAGNOSIS — E669 Obesity, unspecified: Secondary | ICD-10-CM | POA: Diagnosis not present

## 2012-11-22 ENCOUNTER — Other Ambulatory Visit: Payer: Self-pay | Admitting: Internal Medicine

## 2012-11-22 DIAGNOSIS — R599 Enlarged lymph nodes, unspecified: Secondary | ICD-10-CM

## 2012-11-27 ENCOUNTER — Ambulatory Visit
Admission: RE | Admit: 2012-11-27 | Discharge: 2012-11-27 | Disposition: A | Discharge status: Home / Self Care | Payer: Medicare Other | Source: Ambulatory Visit | Attending: Internal Medicine | Admitting: Internal Medicine

## 2012-11-27 DIAGNOSIS — R599 Enlarged lymph nodes, unspecified: Secondary | ICD-10-CM | POA: Diagnosis not present

## 2013-01-15 DIAGNOSIS — M961 Postlaminectomy syndrome, not elsewhere classified: Secondary | ICD-10-CM | POA: Diagnosis not present

## 2013-03-15 DIAGNOSIS — F411 Generalized anxiety disorder: Secondary | ICD-10-CM | POA: Diagnosis not present

## 2013-03-15 DIAGNOSIS — M961 Postlaminectomy syndrome, not elsewhere classified: Secondary | ICD-10-CM | POA: Diagnosis not present

## 2013-03-21 DIAGNOSIS — Z23 Encounter for immunization: Secondary | ICD-10-CM | POA: Diagnosis not present

## 2013-04-17 ENCOUNTER — Encounter: Payer: Self-pay | Admitting: Internal Medicine

## 2013-04-17 ENCOUNTER — Ambulatory Visit: Payer: Medicare Other | Admitting: Internal Medicine

## 2013-04-17 VITALS — BP 124/72 | HR 50 | Temp 98.1°F | Resp 16 | Wt 276.0 lb

## 2013-04-17 DIAGNOSIS — I10 Essential (primary) hypertension: Secondary | ICD-10-CM

## 2013-04-17 DIAGNOSIS — N529 Male erectile dysfunction, unspecified: Secondary | ICD-10-CM

## 2013-04-17 DIAGNOSIS — R7309 Other abnormal glucose: Secondary | ICD-10-CM | POA: Diagnosis not present

## 2013-04-17 DIAGNOSIS — E782 Mixed hyperlipidemia: Secondary | ICD-10-CM

## 2013-04-17 DIAGNOSIS — E559 Vitamin D deficiency, unspecified: Secondary | ICD-10-CM

## 2013-04-17 DIAGNOSIS — Z79899 Other long term (current) drug therapy: Secondary | ICD-10-CM | POA: Diagnosis not present

## 2013-04-17 LAB — CBC WITH DIFFERENTIAL/PLATELET
Basophils Relative: 1 % (ref 0–1)
Eosinophils Absolute: 0.2 10*3/uL (ref 0.0–0.7)
Eosinophils Relative: 2 % (ref 0–5)
Hemoglobin: 17 g/dL (ref 13.0–17.0)
Lymphs Abs: 1.3 10*3/uL (ref 0.7–4.0)
MCH: 31.1 pg (ref 26.0–34.0)
MCHC: 35.3 g/dL (ref 30.0–36.0)
MCV: 88.1 fL (ref 78.0–100.0)
Monocytes Relative: 8 % (ref 3–12)
Neutrophils Relative %: 71 % (ref 43–77)
Platelets: 193 10*3/uL (ref 150–400)
RBC: 5.47 MIL/uL (ref 4.22–5.81)

## 2013-04-17 LAB — HEMOGLOBIN A1C
Hgb A1c MFr Bld: 5.7 % — ABNORMAL HIGH (ref <5.7)
Mean Plasma Glucose: 117 mg/dL — ABNORMAL HIGH (ref <117)

## 2013-04-17 MED ORDER — ATORVASTATIN CALCIUM 40 MG PO TABS: ORAL_TABLET | ORAL | Status: DC

## 2013-04-17 MED ORDER — MINOXIDIL 10 MG PO TABS: ORAL_TABLET | ORAL | Status: DC

## 2013-04-17 MED ORDER — TADALAFIL 5 MG PO TABS: 5.0000 mg | ORAL_TABLET | Freq: Every day | ORAL | Status: DC | PRN

## 2013-04-17 NOTE — Patient Instructions (Signed)
Continue diet & medications same as discussed.   Further disposition pending lab results.       Erectile Dysfunction Erectile dysfunction is the inability to get or sustain a good enough erection to have sexual intercourse. Erectile dysfunction may involve:  Inability to get an erection.  Lack of enough hardness to allow penetration.  Loss of the erection before sex is finished.  Premature ejaculation. CAUSES  Certain drugs, such as:  Pain relievers.  Antihistamines.  Antidepressants.  Blood pressure medicines.  Water pills (diuretics).  Ulcer medicines.  Muscle relaxants.  Illegal drugs.  Excessive drinking.  Psychological causes, such as:  Anxiety.  Depression.  Sadness.  Exhaustion.  Performance fear.  Stress.  Physical causes, such as:  Artery problems. This may include diabetes, smoking, liver disease, or atherosclerosis.  High blood pressure.  Hormonal problems, such as low testosterone.  Obesity.  Nerve problems. This may include back or pelvic injuries, diabetes mellitus, multiple sclerosis, or Parkinson disease. SYMPTOMS  Inability to get an erection.  Lack of enough hardness to allow penetration.  Loss of the erection before sex is finished.  Premature ejaculation.  Normal erections at some times, but with frequent unsatisfactory episodes.  Orgasms that are not satisfactory in sensation or frequency.  Low sexual satisfaction in either partner because of erection problems.  A curved penis occurring with erection. The curve may cause pain or may be too curved to allow for intercourse.  Never having nighttime erections. DIAGNOSIS Your caregiver can often diagnose this condition by:  Performing a physical exam to find other diseases or specific problems with the penis.  Asking you detailed questions about the problem.  Performing blood tests to check for diabetes mellitus or to measure hormone  levels.  Performing urine tests to find other underlying health conditions.  Performing an ultrasound exam to check for scarring.  Performing a test to check blood flow to the penis.  Doing a sleep study at home to measure nighttime erections. TREATMENT   You may be prescribed medicines by mouth.  You may be given medicine injections into the penis.  You may be prescribed a vacuum pump with a ring.  Penile implant surgery may be performed. You may receive:  An inflatable implant.  A semirigid implant.  Blood vessel surgery may be performed. HOME CARE INSTRUCTIONS  If you are prescribed oral medicine, you should take the medicine as prescribed. Do not increase the dosage without first discussing it with your physician.  If you are using self-injections, be careful to avoid any veins that are on the surface of the penis. Apply pressure to the injection site for 5 minutes.  If you are using a vacuum pump, make sure you have read the instructions before using it. Discuss any questions with your physician before taking the pump home. SEEK MEDICAL CARE IF:  You experience pain that is not responsive to the pain medicine you have been prescribed.  You experience nausea or vomiting. SEEK IMMEDIATE MEDICAL CARE IF:   When taking oral or injectable medications, you experience an erection that lasts longer than 4 hours. If your physician is unavailable, go to the nearest emergency room for evaluation. An erection that lasts much longer than 4 hours can result in permanent damage to your penis.  You have pain that is severe.  You develop redness, severe pain, or severe swelling of your penis.  You have redness spreading up into your groin or lower abdomen.  You are  unable to pass your urine. Document Released: 05/06/2000 Document Revised: 01/09/2013 Document Reviewed: 10/11/2012 [facility] Patient Information 2014 [place], [place]. Hypertension As your heart beats, it forces blood  through your arteries. This force is your blood pressure. If the pressure is too high, it is called hypertension (HTN) or high blood pressure. HTN is dangerous because you may have it and not know it. High blood pressure may mean that your heart has to work harder to pump blood. Your arteries may be narrow or stiff. The extra work puts you at risk for heart disease, stroke, and other problems.  Blood pressure consists of two numbers, a higher number over a lower, 110/72, for example. It is stated as "110 over 72." The ideal is below 120 for the top number (systolic) and under 80 for the bottom (diastolic). Write down your blood pressure today. You should pay close attention to your blood pressure if you have certain conditions such as:  Heart failure.  Prior heart attack.  Diabetes  Chronic kidney disease.  Prior stroke.  Multiple risk factors for heart disease. To see if you have HTN, your blood pressure should be measured while you are seated with your arm held at the level of the heart. It should be measured at least twice. A one-time elevated blood pressure reading (especially in the Emergency Department) does not mean that you need treatment. There may be conditions in which the blood pressure is different between your right and left arms. It is important to see your caregiver soon for a recheck. Most people have essential hypertension which means that there is not a specific cause. This type of high blood pressure may be lowered by changing lifestyle factors such as:  Stress.  Smoking.  Lack of exercise.  Excessive weight.  Drug/tobacco/alcohol use.  Eating less salt. Most people do not have symptoms from high blood pressure until it has caused damage to the body. Effective treatment can often prevent, delay or reduce that damage. TREATMENT  When a cause has been identified, treatment for high blood pressure is directed at the cause. There are a large number of medications to  treat HTN. These fall into several categories, and your caregiver will help you select the medicines that are best for you. Medications may have side effects. You should review side effects with your caregiver. If your blood pressure stays high after you have made lifestyle changes or started on medicines,   Your medication(s) may need to be changed.  Other problems may need to be addressed.  Be certain you understand your prescriptions, and know how and when to take your medicine.  Be sure to follow up with your caregiver within the time frame advised (usually within two weeks) to have your blood pressure rechecked and to review your medications.  If you are taking more than one medicine to lower your blood pressure, make sure you know how and at what times they should be taken. Taking two medicines at the same time can result in blood pressure that is too low. SEEK IMMEDIATE MEDICAL CARE IF:  You develop a severe headache, blurred or changing vision, or confusion.  You have unusual weakness or numbness, or a faint feeling.  You have severe chest or abdominal pain, vomiting, or breathing problems. MAKE SURE YOU:   Understand these instructions.  Will watch your condition.  Will get help right away if you are not doing well or get worse. Document Released: 05/09/2005 Document Revised: 08/01/2011 Document Reviewed: 12/28/2007 [employer]  Patient Information 2014 [place], [place]. Diabetes and Exercise Exercising regularly is important. It is not just about losing weight. It has many health benefits, such as:  Improving your overall fitness, flexibility, and endurance.  Increasing your bone density.  Helping with weight control.  Decreasing your body fat.  Increasing your muscle strength.  Reducing stress and tension.  Improving your overall health. People with diabetes who exercise gain additional benefits because exercise:  Reduces appetite.  Improves the body's use of  blood sugar (glucose).  Helps lower or control blood glucose.  Decreases blood pressure.  Helps control blood lipids (such as cholesterol and triglycerides).  Improves the body's use of the hormone insulin by:  Increasing the body's insulin sensitivity.  Reducing the body's insulin needs.  Decreases the risk for heart disease because exercising:  Lowers cholesterol and triglycerides levels.  Increases the levels of good cholesterol (such as high-density lipoproteins [HDL]) in the body.  Lowers blood glucose levels. YOUR ACTIVITY PLAN  Choose an activity that you enjoy and set realistic goals. Your health care provider or diabetes educator can help you make an activity plan that works for you. You can break activities into 2 or 3 sessions throughout the day. Doing so is as good as one long session. Exercise ideas include:  Taking the dog for a walk.  Taking the stairs instead of the elevator.  Dancing to your favorite song.  Doing your favorite exercise with a friend. RECOMMENDATIONS FOR EXERCISING WITH TYPE 1 OR TYPE 2 DIABETES   Check your blood glucose before exercising. If blood glucose levels are greater than 240 mg/dL, check for urine ketones. Do not exercise if ketones are present.  Avoid injecting insulin into areas of the body that are going to be exercised. For example, avoid injecting insulin into:  The arms when playing tennis.  The legs when jogging.  Keep a record of:  Food intake before and after you exercise.  Expected peak times of insulin action.  Blood glucose levels before and after you exercise.  The type and amount of exercise you have done.  Review your records with your health care provider. Your health care provider will help you to develop guidelines for adjusting food intake and insulin amounts before and after exercising.  If you take insulin or oral hypoglycemic agents, watch for signs and symptoms of hypoglycemia. They  include:  Dizziness.  Shaking.  Sweating.  Chills.  Confusion.  Drink plenty of water while you exercise to prevent dehydration or heat stroke. Body water is lost during exercise and must be replaced.  Talk to your health care provider before starting an exercise program to make sure it is safe for you. Remember, almost any type of activity is better than none. Document Released: 07/30/2003 Document Revised: 01/09/2013 Document Reviewed: 10/16/2012 [facility] Patient Information 2014 [place], [place]. Cholesterol Cholesterol is a white, waxy, fat-like protein needed by your body in small amounts. The liver makes all the cholesterol you need. It is carried from the liver by the blood through the blood vessels. Deposits (plaque) may build up on blood vessel walls. This makes the arteries narrower and stiffer. Plaque increases the risk for heart attack and stroke. You cannot feel your cholesterol level even if it is very high. The only way to know is by a blood test to check your lipid (fats) levels. Once you know your cholesterol levels, you should keep a record of the test results. Work with your caregiver to to keep your levels  in the desired range. WHAT THE RESULTS MEAN:  Total cholesterol is a rough measure of all the cholesterol in your blood.  LDL is the so-called bad cholesterol. This is the type that deposits cholesterol in the walls of the arteries. You want this level to be low.  HDL is the good cholesterol because it cleans the arteries and carries the LDL away. You want this level to be high.  Triglycerides are fat that the body can either burn for energy or store. High levels are closely linked to heart disease. DESIRED LEVELS:  Total cholesterol below 200.  LDL below 100 for people at risk, below 70 for very high risk.  HDL above 50 is good, above 60 is best.  Triglycerides below 150. HOW TO LOWER YOUR CHOLESTEROL:  Diet.  Choose fish or white meat chicken and  [place], roasted or baked. Limit fatty cuts of red meat, fried foods, and processed meats, such as sausage and lunch meat.  Eat lots of fresh fruits and vegetables. Choose whole grains, beans, pasta, potatoes and cereals.  Use only small amounts of olive, corn or canola oils. Avoid butter, mayonnaise, shortening or palm kernel oils. Avoid foods with trans-fats.  Use skim/nonfat milk and low-fat/nonfat yogurt and cheeses. Avoid whole milk, cream, ice cream, egg yolks and cheeses. Healthy desserts include angel food cake, ginger snaps, animal crackers, hard candy, popsicles, and low-fat/nonfat frozen yogurt. Avoid pastries, cakes, pies and cookies.  Exercise.  A regular program helps decrease LDL and raises HDL.  Helps with weight control.  Do things that increase your activity level like gardening, walking, or taking the stairs.  Medication.  May be prescribed by your caregiver to help lowering cholesterol and the risk for heart disease.  You may need medicine even if your levels are normal if you have several risk factors. HOME CARE INSTRUCTIONS   Follow your diet and exercise programs as suggested by your caregiver.  Take medications as directed.  Have blood work done when your caregiver feels it is necessary. MAKE SURE YOU:   Understand these instructions.  Will watch your condition.  Will get help right away if you are not doing well or get worse. Document Released: 02/01/2001 Document Revised: 08/01/2011 Document Reviewed: 07/25/2007 [facility] Patient Information 2014 [place], [place].

## 2013-04-17 NOTE — Progress Notes (Signed)
Patient ID: Jared [name], male   DOB: 05/20/1956, 57 y.o.   MRN: [id number]   This very nice 57 yo MWM presents for 3 month follow up with hypertension, hyperlipidemia, pre-diabetes and vitamin D deficiency.    BP has not been controlled at home- reporting occasional spikes in BP to 170-180 systolic. Today's BP is 124/72. He had negative 2DEC in 2007 and negative stress cardiolyte in 2008 and again in November 2012. Patient denies any cardiac type chest pain, palpitations, dyspnea, dizziness, claudication, or dependent edema.   Hyperlipidemia is controlled with diet & meds. Last cholesterol was  131, and LDL 73 at goal. Patient denies myalgias or other med SE's.    Also, the patient has history of prediabetes with last A1c of 5.6% in February(was 5.9% in February 2013). Patient denies any symptoms of reactive hypoglycemia, diabetic polys, paresthesias or visual blurring.   Patient is on disability due to chronic LBP from DDD and is followed by Pain Management in [place].    Further, Patient has history of vitamin D deficiency with last vitamin D of 52 in February. Patient supplements vitamin D without any suspected side-effects.  Current Outpatient Prescriptions on File Prior to Visit  Medication Sig Dispense Refill  . ALPRAZolam (XANAX) 0.5 MG tablet Take 0.5 mg by mouth at bedtime as needed. For anxiety      . bisoprolol-hydrochlorothiazide (ZIAC) 10-6.25 MG per tablet Take 1 tablet by mouth daily.      . Cholecalciferol (VITAMIN D-3) 5000 UNITS TABS Take 1 tablet by mouth 3 (three) times daily.      . citalopram (CELEXA) 40 MG tablet Take 40 mg by mouth daily.      . Flaxseed, Linseed, (FLAXSEED OIL PO) Take 1 tablet by mouth daily.        Marland Kitchen glucosamine-chondroitin 500-400 MG tablet Take 2 tablets by mouth daily.        Marland Kitchen losartan-hydrochlorothiazide (HYZAAR) 100-25 MG per tablet Take 1 tablet by mouth daily.      . Omega-3 Fatty Acids (FISH OIL BURP-LESS PO) Take 1 tablet by mouth  daily.        . Tamsulosin HCl (FLOMAX) 0.4 MG CAPS Take 0.4 mg by mouth daily.      Marland Kitchen ibuprofen (ADVIL,MOTRIN) 600 MG tablet Take 1 tablet (600 mg total) by mouth every 6 (six) hours as needed for pain.  30 tablet  0  . methocarbamol (ROBAXIN) 500 MG tablet Take 1 tablet (500 mg total) by mouth 2 (two) times daily.  20 tablet  0  . oxyCODONE (OXYCONTIN) 80 MG 12 hr tablet Take 80 mg by mouth every 12 (twelve) hours.       Marland Kitchen oxyCODONE-acetaminophen (PERCOCET) 10-325 MG per tablet Take 1 tablet by mouth every 4 (four) hours as needed. For pain       No current facility-administered medications on file prior to visit.     Allergies  Allergen Reactions  . Morphine And Related   . Neurontin [Gabapentin]     PMHx:   Past Medical History  Diagnosis Date  . Chest pain   . HTN (hypertension)   . Hyperlipemia   . DJD (degenerative joint disease)   . BPH (benign prostatic hypertrophy)   . Obesity   . OSA (obstructive sleep apnea)   . Hypercholesteremia     FHx:    Reviewed / unchanged  SHx:    Reviewed / unchanged  Systems Review: Constitutional: Denies fever, chills, wt changes, headaches, insomnia, fatigue,  night sweats, change in appetite. Eyes: Denies redness, blurred vision, diplopia, discharge, itchy, watery eyes.  ENT: Denies discharge, congestion, post nasal drip, epistaxis, sore throat, earache, hearing loss, dental pain, tinnitus, vertigo, sinus pain, snoring.  CV: Denies chest pain, palpitations, irregular heartbeat, syncope, dyspnea, diaphoresis, orthopnea, PND, claudication, edema. Respiratory: denies cough, dyspnea, DOE, pleurisy, hoarseness, laryngitis, wheezing.  Gastrointestinal: Denies dysphagia, odynophagia, heartburn, reflux, water brash, abdominal pain or cramps, nausea, vomiting, bloating, diarrhea, constipation, hematemesis, melena, hematochezia,  Hemorrhoids. Genitourinary: Denies dysuria, frequency, urgency, nocturia, hesitancy, discharge, hematuria, flank  pain. Musculoskeletal: Denies arthralgias, myalgias, stiffness, jt. swelling, pain, limp, strain/sprain.  Skin: Denies pruritus, rash, hives, warts, acne, eczema, change in skin lesion(s). Neuro: No weakness, tremor, incoordination, spasms, paresthesia, or pain. Psychiatric: Denies confusion, memory loss, or sensory loss. Endo: Denies change in weight, skin, hair change.  Heme/Lymph: No excessive bleeding, bruising, orenlarged lymph nodes.  Filed Vitals:   04/17/13 1433  BP: 124/72  Pulse: 50  Temp: 98.1 F (36.7 C)  Resp: 16    BMI 35.42 kg/(m^2)    Height    6\' 2"    Weight   276 lb  On Exam: Appears well nourished - in no distress. Eyes: PERRLA, EOMs, conjunctiva no swelling or erythema. Sinuses: No frontal/maxillary tenderness ENT/Mouth: EAC's clear, TM's nl w/o erythema, bulging. Nares clear w/o erythema, swelling, exudates. Oropharynx clear without erythema or exudates. Oral hygiene is good. Tongue normal, non obstructing. Hearing intact.  Neck: Supple. Thyroid nl. Car 2+/2+ without bruits, nodes or JVD. Chest: Respirations nl with BS clear & equal w/o rales, rhonchi, wheezing or stridor.  Cor: Heart sounds normal w/ regular rate and rhythm without sig. murmurs, gallops, clicks, or rubs. Peripheral pulses normal and equal  without edema.  Abdomen: Soft & bowel sounds normal. Non-tender w/o guarding, rebound, hernias, masses, or organomegaly.  Lymphatics: Unremarkable.  Musculoskeletal: Full ROM all peripheral extremities, joint stability, 5/5 strength, and normal gait.  Skin: Warm, dry without exposed rashes, lesions, ecchymosis apparent.  Neuro: Cranial nerves intact, reflexes equal bilaterally. Sensory-motor testing grossly intact. Tendon reflexes grossly intact.  Pysch: Alert & oriented x 3. Insight and judgement nl & appropriate. No ideations.  Assessment and Plan:  1. Hypertension - Continue monitor blood pressure at home. Continue diet/meds same with addition of new  Rx Minoxidil 10 mg - discussed starting with 1/4 tab and increasing to 1/2 or up to 1 whole tab every 3-5 days to control BP spikes.  2. Hyperlipidemia - Continue diet/meds, exercise,& lifestyle modifications. Continue monitor periodic cholesterol/liver & renal functions   3. Pre-diabetes/Insulin Resistance - Continue diet, exercise, lifestyle modifications. Monitor appropriate labs.  4. Vitamin D Deficiency - Continue supplementation.  5. DDD with Chronic Lumbago  Further disposition pending results of labs.

## 2013-04-18 LAB — HEPATIC FUNCTION PANEL
ALT: 16 U/L (ref 0–53)
AST: 22 U/L (ref 0–37)
Albumin: 4.4 g/dL (ref 3.5–5.2)
Total Protein: 6.7 g/dL (ref 6.0–8.3)

## 2013-04-18 LAB — TSH: TSH: 2.047 u[IU]/mL (ref 0.350–4.500)

## 2013-04-18 LAB — INSULIN, FASTING: Insulin fasting, serum: 11 u[IU]/mL (ref 3–28)

## 2013-04-18 LAB — BASIC METABOLIC PANEL WITH GFR
CO2: 32 mEq/L (ref 19–32)
Calcium: 9.6 mg/dL (ref 8.4–10.5)
Creat: 1.11 mg/dL (ref 0.50–1.35)
GFR, Est African American: 84 mL/min
Glucose, Bld: 84 mg/dL (ref 70–99)

## 2013-04-18 LAB — MAGNESIUM: Magnesium: 1.7 mg/dL (ref 1.5–2.5)

## 2013-04-18 LAB — LIPID PANEL: Cholesterol: 152 mg/dL (ref 0–200)

## 2013-04-25 ENCOUNTER — Other Ambulatory Visit: Payer: Self-pay | Admitting: Internal Medicine

## 2013-05-10 DIAGNOSIS — M961 Postlaminectomy syndrome, not elsewhere classified: Secondary | ICD-10-CM | POA: Diagnosis not present

## 2013-05-14 ENCOUNTER — Other Ambulatory Visit: Payer: Self-pay | Admitting: Physician Assistant

## 2013-05-14 DIAGNOSIS — I10 Essential (primary) hypertension: Secondary | ICD-10-CM

## 2013-05-14 DIAGNOSIS — E782 Mixed hyperlipidemia: Secondary | ICD-10-CM

## 2013-05-14 MED ORDER — ATORVASTATIN CALCIUM 40 MG PO TABS: ORAL_TABLET | ORAL | Status: DC

## 2013-05-14 MED ORDER — MINOXIDIL 10 MG PO TABS: ORAL_TABLET | ORAL | Status: DC

## 2013-05-22 ENCOUNTER — Ambulatory Visit: Payer: Self-pay | Admitting: Internal Medicine

## 2013-05-22 ENCOUNTER — Ambulatory Visit (INDEPENDENT_AMBULATORY_CARE_PROVIDER_SITE_OTHER): Payer: Medicare Other | Admitting: Internal Medicine

## 2013-05-22 ENCOUNTER — Encounter: Payer: Self-pay | Admitting: Internal Medicine

## 2013-05-22 VITALS — BP 136/74 | HR 56 | Temp 97.9°F | Resp 18 | Wt 279.2 lb

## 2013-05-22 DIAGNOSIS — J45909 Unspecified asthma, uncomplicated: Secondary | ICD-10-CM

## 2013-05-22 DIAGNOSIS — E559 Vitamin D deficiency, unspecified: Secondary | ICD-10-CM

## 2013-05-22 DIAGNOSIS — R7303 Prediabetes: Secondary | ICD-10-CM | POA: Insufficient documentation

## 2013-05-22 DIAGNOSIS — G894 Chronic pain syndrome: Secondary | ICD-10-CM | POA: Insufficient documentation

## 2013-05-22 DIAGNOSIS — G4733 Obstructive sleep apnea (adult) (pediatric): Secondary | ICD-10-CM | POA: Insufficient documentation

## 2013-05-22 DIAGNOSIS — I10 Essential (primary) hypertension: Secondary | ICD-10-CM

## 2013-05-22 DIAGNOSIS — J019 Acute sinusitis, unspecified: Secondary | ICD-10-CM | POA: Diagnosis not present

## 2013-05-22 DIAGNOSIS — K2100 Gastro-esophageal reflux disease with esophagitis, without bleeding: Secondary | ICD-10-CM | POA: Insufficient documentation

## 2013-05-22 DIAGNOSIS — J0410 Acute tracheitis without obstruction: Secondary | ICD-10-CM | POA: Diagnosis not present

## 2013-05-22 DIAGNOSIS — R7309 Other abnormal glucose: Secondary | ICD-10-CM

## 2013-05-22 DIAGNOSIS — M5136 Other intervertebral disc degeneration, lumbar region: Secondary | ICD-10-CM | POA: Insufficient documentation

## 2013-05-22 DIAGNOSIS — E782 Mixed hyperlipidemia: Secondary | ICD-10-CM | POA: Insufficient documentation

## 2013-05-22 DIAGNOSIS — E291 Testicular hypofunction: Secondary | ICD-10-CM

## 2013-05-22 DIAGNOSIS — E349 Endocrine disorder, unspecified: Secondary | ICD-10-CM | POA: Insufficient documentation

## 2013-05-22 MED ORDER — PREDNISONE 20 MG PO TABS: 10.0000 mg | ORAL_TABLET | ORAL | Status: DC

## 2013-05-22 MED ORDER — AZITHROMYCIN 250 MG PO TABS: ORAL_TABLET | ORAL | Status: AC

## 2013-05-22 NOTE — Progress Notes (Signed)
Patient ID: Jared [name], male   DOB: 18-Oct-1955, 57 y.o.   MRN: [id number]   This very nice 57 y.o.male presents for 1 month follow up with Hypertension, Hyperlipidemia, Pre-Diabetes and Vitamin D Deficiency.    BP has been better controlled at home since adding Monoxidil which he has continued at 1/4 tab (=2.5 mg). Systolic BP's hve max'd out at 140. Today's BP is 136/74.  Patient denies any cardiac type chest pain, palpitations, dyspnea/orthopnea/PND, dizziness, claudication, or dependent edema.   Hyperlipidemia is controlled with diet & meds. Last Cholesterol was  152, Triglycerides were 144, HDL 34 and LDL 90. Patient denies myalgias or other med SE's.    Also, the patient has history of PreDiabetes  with A1c 5.6% and elevated insulin  41 in July 2012. Now last A1c of 5.7% one month ago. Patient denies any symptoms of reactive hypoglycemia, diabetic polys, paresthesias or visual blurring.   Further, Patient has history of Vitamin D Deficiency with last vitamin D of 52 in Feb (was 32 in 2009). Patient supplements vitamin D without any suspected side-effects.   Lastly, he c/o of a cough productive of a yellowish sputum with wheezing nad sinus pressure and drainage.  Medication Sig Dispense Refill  . ALPRAZolam (XANAX) 0.5 MG tablet Take 0.5 mg by mouth at bedtime as needed. For anxiety      . atorvastatin (LIPITOR) 40 MG tablet Take 1 tablet daily or as directed for cholesterol  90 tablet  0  . bisoprolol-hydrochlorothiazide (ZIAC) 10-6.25 MG per tablet Take 1 tablet by mouth daily.      . Cholecalciferol (VITAMIN D-3) 5000 UNITS TABS Take 1 tablet by mouth 3 (three) times daily.      . citalopram (CELEXA) 40 MG tablet Take 40 mg by mouth daily.      . Flaxseed, Linseed, (FLAXSEED OIL PO) Take 1 tablet by mouth daily.        Marland Kitchen glucosamine-chondroitin 500-400 MG tablet Take 2 tablets by mouth daily.        Marland Kitchen ibuprofen (ADVIL,MOTRIN) 600 MG tablet Take 1 tablet (600 mg total) by mouth every 6  (six) hours as needed for pain.  30 tablet  0  . losartan-hydrochlorothiazide (HYZAAR) 100-25 MG per tablet Take 1 tablet by mouth daily.      . methocarbamol (ROBAXIN) 500 MG tablet Take 1 tablet (500 mg total) by mouth 2 (two) times daily.  20 tablet  0  . minoxidil (LONITEN) 10 MG tablet Take 1/4 (= 2.5 mg) to 1/2 ( = 5 mg) to 1 tab (= 10 mg ) at bedtime as directed to control BP  90 tablet  0  . montelukast (SINGULAIR) 10 MG tablet TAKE 1 TABLET EVERY DAY  90 tablet  4  . Omega-3 Fatty Acids (FISH OIL BURP-LESS PO) Take 1 tablet by mouth daily.        Marland Kitchen oxyCODONE (OXYCONTIN) 80 MG 12 hr tablet Take 80 mg by mouth every 12 (twelve) hours.       Marland Kitchen oxyCODONE-acetaminophen (PERCOCET) 10-325 MG per tablet Take 1 tablet by mouth every 4 (four) hours as needed. For pain      . tadalafil (CIALIS) 5 MG tablet Take 1 tablet (5 mg total) by mouth daily as needed for erectile dysfunction.  30 tablet  99  . Tamsulosin HCl (FLOMAX) 0.4 MG CAPS Take 0.4 mg by mouth daily.         Allergies  Allergen Reactions  . Morphine And Related   .  Neurontin [Gabapentin]     PMHx:   Past Medical History  Diagnosis Date  . Chest pain   . HTN (hypertension)   . Hyperlipemia   . DJD (degenerative joint disease)   . BPH (benign prostatic hypertrophy)   . Obesity   . OSA (obstructive sleep apnea)   . Hypercholesteremia     FHx:    Reviewed / unchanged  SHx:    Reviewed / unchanged  Systems Review: Constitutional: Denies fever, chills, wt changes, headaches, insomnia, fatigue, night sweats, change in appetite. Eyes: Denies redness, blurred vision, diplopia, discharge, itchy, watery eyes.  ENT: Denies discharge, congestion, post nasal drip, epistaxis, sore throat, earache, hearing loss, dental pain, tinnitus, vertigo, sinus pain, snoring.  CV: Denies chest pain, palpitations, irregular heartbeat, syncope, dyspnea, diaphoresis, orthopnea, PND, claudication, edema. Respiratory: Above.  Gastrointestinal:  Denies dysphagia, odynophagia, heartburn, reflux, water brash, abdominal pain or cramps, nausea, vomiting, bloating, diarrhea, constipation, hematemesis, melena, hematochezia,  or hemorrhoids. Genitourinary: Denies dysuria, frequency, urgency, nocturia, hesitancy, discharge, hematuria, flank pain. Musculoskeletal: Denies arthralgias, myalgias, stiffness, jt. swelling, pain, limp, strain/sprain.  Skin: Denies pruritus, rash, hives, warts, acne, eczema, change in skin lesion(s). Neuro: No weakness, tremor, incoordination, spasms, paresthesia, or pain. Psychiatric: Denies confusion, memory loss, or sensory loss. Endo: Denies change in weight, skin, hair change.  Heme/Lymph: No excessive bleeding, bruising, orenlarged lymph nodes.  BP: 136/74  Pulse: 56  Temp: 97.9 F (36.6 C)  Resp: 18    Estimated body mass index is 35.83 kg/(m^2) as calculated from the following:   Height as of 05/16/12: 6\' 2"  (1.88 m).   Weight as of this encounter: 279 lb 3.2 oz (126.644 kg).  On Exam: Appears well nourished - in no distress. Eyes: PERRLA, EOMs, conjunctiva no swelling or erythema. Sinuses: Bilat frontal/maxillary tenderness ENT/Mouth: EAC's clear, TM's nl w/o erythema, bulging. Nares clear w/o erythema, swelling, exudates. Oropharynx clear without erythema or exudates. Oral hygiene is good. Tongue normal, non obstructing. Hearing intact.  Neck: Supple. Thyroid nl. Car 2+/2+ without bruits, nodes or JVD. Chest: Respirations nl with BS equal w/scattered rales, rhonchi & wheezing, but no  stridor.  Cor: Heart sounds normal w/ regular rate and rhythm without sig. murmurs, gallops, clicks, or rubs. Peripheral pulses normal and equal  without edema.  Abdomen: Soft & bowel sounds normal. Non-tender w/o guarding, rebound, hernias, masses, or organomegaly.  Lymphatics: Unremarkable.  Musculoskeletal: Full ROM all peripheral extremities, joint stability, 5/5 strength, and normal gait.  Skin: Warm, dry without  exposed rashes, lesions, ecchymosis apparent.  Neuro: Cranial nerves intact, reflexes equal bilaterally. Sensory-motor testing grossly intact. Tendon reflexes grossly intact.  Pysch: Alert & oriented x 3. Insight and judgement nl & appropriate. No ideations.  Assessment and Plan:  1. Hypertension - Continue monitor blood pressure at home. Continue diet/meds same.  2. Hyperlipidemia - Continue diet/meds, exercise,& lifestyle modifications. Continue monitor periodic cholesterol/liver & renal functions   3. Pre-diabetes/Insulin Resistance - Continue diet, exercise, lifestyle modifications. Monitor appropriate labs.  3. Diabetes - continue recommend prudent low glycemic diet, weight control, regular exercise, diabetic monitoring and periodic eye exams.  4. Vitamin D Deficiency - Continue supplementation.  5. Sinusitis, Tracheitis and Asthmatic Bronchitis - Rx Z pak & 1 RF , Prednisone pulse /taper   Recommended regular exercise, BP monitoring, weight control, and discussed med and SE's. Recommended labs to assess and monitor clinical status. Further disposition pending results of labs.

## 2013-05-22 NOTE — Patient Instructions (Signed)

## 2013-07-14 DIAGNOSIS — Z79899 Other long term (current) drug therapy: Secondary | ICD-10-CM | POA: Insufficient documentation

## 2013-07-14 NOTE — Patient Instructions (Signed)

## 2013-07-14 NOTE — Progress Notes (Signed)
Patient ID:  Jared [name], male   DOB: [birth year], 58 y.o.   MRN: [id number]    This very nice 58 y.o. male presents for Annual Comprehensive Exam with Hypertension, Hyperlipidemia, Pre-Diabetes,OSA, DDD/Chronic Lumbago, Testosterone  and Vitamin D Deficiency.    HTN predates since   . BP has been controlled at home. Today's BP: 126/76 mmHg . In 2007 he had a neg. 2DEC and in 2008 and 2012 he had two nl Cardiolye tests. Patient denies any cardiac type chest pain, palpitations, dyspnea/orthopnea/PND, dizziness, claudication, or dependent edema.   Hyperlipidemia is controlled with diet & meds. Last Cholesterol was at goal as recorded below. Patient denies myalgias or other med SE's.   Lab Results  Component Value Date   CHOL 152 04/17/2013   HDL 34* 04/17/2013   LDLCALC 90 04/17/2013   TRIG 141 04/17/2013   CHOLHDL 4.5 04/17/2013    Patient has Chronic Lumbago with back surgeries predating from 1994 and then in 2001 and last in 2004. He is followed at a pain clinic in [facility].   Also, the patient has history of PreDiabetes/insulin resistance with A1c/Insulin 5.6%/41 in July 2012 and last A1c was 5.7% in Nov 2014.Marland Kitchen Patient denies any symptoms of reactive hypoglycemia, diabetic polys, paresthesias or visual blurring.   Further, Patient has history of Vitamin D Deficiency of 32 in 2009 with last vitamin D of 52 in Feb, 2014. Patient supplements vitamin D without any suspected side-effects.    Medication List       ALPRAZolam 1 MG tablet  Commonly known as:  XANAX  1/2 to 1 tablet up to 3 x daily as needed for anxiety or sleep     atorvastatin 40 MG tablet  Commonly known as:  LIPITOR  Take 1 tablet daily or as directed for cholesterol     bisoprolol-hydrochlorothiazide 10-6.25 MG per tablet  Commonly known as:  ZIAC  Take 1 tablet by mouth daily.     citalopram 40 MG tablet  Commonly known as:  CELEXA  Take 40 mg by mouth daily.     FISH OIL BURP-LESS PO  Take 1 tablet by mouth  daily.     FLAXSEED OIL PO  Take 1 tablet by mouth daily.     losartan-hydrochlorothiazide 100-25 MG per tablet  Commonly known as:  HYZAAR  Take 1 tablet by mouth daily.     minoxidil 10 MG tablet  Commonly known as:  LONITEN  Take 1/4 (= 2.5 mg) to 1/2 ( = 5 mg) to 1 tab (= 10 mg ) at bedtime as directed to control BP     montelukast 10 MG tablet  Commonly known as:  SINGULAIR  TAKE 1 TABLET EVERY DAY     ondansetron 8 MG tablet  Commonly known as:  ZOFRAN  1/2 to 1 tablet up to 3 x daily as needed for nausea.     oxycodone 30 MG immediate release tablet  Commonly known as:  ROXICODONE  Take 30 mg by mouth every 4 (four) hours as needed for pain. Max dose of 240 mg in 24 hours     Vitamin D-3 5000 UNITS Tabs  Take 1 tablet by mouth 3 (three) times daily.         Allergies  Allergen Reactions  . Morphine And Related   . Neurontin [Gabapentin]     PMHx:   Past Medical History  Diagnosis Date  . Chest pain   . HTN (hypertension)   . Hyperlipemia   .  DJD (degenerative joint disease)   . BPH (benign prostatic hypertrophy)   . Obesity   . OSA (obstructive sleep apnea)   . Hypercholesteremia     FHx:    Reviewed / unchanged  SHx:    Reviewed / unchanged  Systems Review: Constitutional: Denies fever, chills, wt changes, headaches, insomnia, fatigue, night sweats, change in appetite. Eyes: Denies redness, blurred vision, diplopia, discharge, itchy, watery eyes.  ENT: Denies discharge, congestion, post nasal drip, epistaxis, sore throat, earache, hearing loss, dental pain, tinnitus, vertigo, sinus pain, snoring.  CV: Denies chest pain, palpitations, irregular heartbeat, syncope, dyspnea, diaphoresis, orthopnea, PND, claudication, edema. Respiratory: denies cough, dyspnea, DOE, pleurisy, hoarseness, laryngitis, wheezing.  Gastrointestinal: Denies dysphagia, odynophagia, heartburn, reflux, water brash, abdominal pain or cramps, nausea, vomiting, bloating, diarrhea,  constipation, hematemesis, melena, hematochezia,  or hemorrhoids. Genitourinary: Denies dysuria, frequency, urgency, nocturia, hesitancy, discharge, hematuria, flank pain. Musculoskeletal: Denies arthralgias, myalgias, stiffness, jt. swelling, pain, limp, strain/sprain.  Skin: Denies pruritus, rash, hives, warts, acne, eczema, change in skin lesion(s). Neuro: No weakness, tremor, incoordination, spasms, paresthesia, or pain. Psychiatric: Denies confusion, memory loss, or sensory loss. Endo: Denies change in weight, skin, hair change.  Heme/Lymph: No excessive bleeding, bruising, orenlarged lymph nodes.  BP: 126/76  Pulse: 64  Temp: 98.1 F (36.7 C)  Resp: 18    Estimated body mass index is 34.41 kg/(m^2) as calculated from the following:   Height as of this encounter: 6' 2.75" (1.899 m).   Weight as of this encounter: 273 lb 9.6 oz (124.104 kg).  On Exam: Appears well nourished - in no distress. Eyes: PERRLA, EOMs, conjunctiva no swelling or erythema. Sinuses: No frontal/maxillary tenderness ENT/Mouth: EAC's clear, TM's nl w/o erythema, bulging. Nares clear w/o erythema, swelling, exudates. Oropharynx clear without erythema or exudates. Oral hygiene is good. Tongue normal, non obstructing. Hearing intact.  Neck: Supple. Thyroid nl. Car 2+/2+ without bruits, nodes or JVD. Chest: Respirations nl with BS clear & equal w/o rales, rhonchi, wheezing or stridor.  Cor: Heart sounds normal w/ regular rate and rhythm without sig. murmurs, gallops, clicks, or rubs. Peripheral pulses normal and equal  without edema.  Abdomen: Soft & bowel sounds normal. Non-tender w/o guarding, rebound, hernias, masses, or organomegaly.  Lymphatics: Unremarkable.  Musculoskeletal: Full ROM all peripheral extremities, joint stability, 5/5 strength, and normal gait.  Skin: Warm, dry without exposed rashes, lesions, ecchymosis apparent.  Neuro: Cranial nerves intact, reflexes equal bilaterally. Sensory-motor testing  grossly intact. Tendon reflexes grossly intact.  Pysch: Alert & oriented x 3. Insight and judgement nl & appropriate. No ideations.  Assessment and Plan:  1. Hypertension - Continue monitor blood pressure at home. Continue diet/meds same.  2. Hyperlipidemia - Continue diet/meds, exercise,& lifestyle modifications. Continue monitor periodic cholesterol/liver & renal functions   3. Pre-diabetes/Insulin Resistance - Continue diet, exercise, lifestyle modifications. Monitor appropriate labs.  4. Vitamin D Deficiency - Continue supplementation.  5.OSA/CPAP  6. Testosterone Deficiency  7. DDD/Chronic Lumbago  8. Obesity (BMI 34.4)  Recommended regular exercise as able, BP monitoring, weight loss, and discussed med and SE's. Recommended labs to assess and monitor clinical status. Further disposition pending results of labs.

## 2013-07-15 ENCOUNTER — Encounter: Payer: Self-pay | Admitting: Internal Medicine

## 2013-07-15 ENCOUNTER — Ambulatory Visit (INDEPENDENT_AMBULATORY_CARE_PROVIDER_SITE_OTHER): Payer: Medicare Other | Admitting: Internal Medicine

## 2013-07-15 VITALS — BP 126/76 | HR 64 | Temp 98.1°F | Resp 18 | Ht 74.75 in | Wt 273.6 lb

## 2013-07-15 DIAGNOSIS — Z125 Encounter for screening for malignant neoplasm of prostate: Secondary | ICD-10-CM

## 2013-07-15 DIAGNOSIS — E782 Mixed hyperlipidemia: Secondary | ICD-10-CM

## 2013-07-15 DIAGNOSIS — R7309 Other abnormal glucose: Secondary | ICD-10-CM | POA: Diagnosis not present

## 2013-07-15 DIAGNOSIS — M961 Postlaminectomy syndrome, not elsewhere classified: Secondary | ICD-10-CM | POA: Diagnosis not present

## 2013-07-15 DIAGNOSIS — Z79899 Other long term (current) drug therapy: Secondary | ICD-10-CM | POA: Diagnosis not present

## 2013-07-15 DIAGNOSIS — E559 Vitamin D deficiency, unspecified: Secondary | ICD-10-CM

## 2013-07-15 DIAGNOSIS — Z1212 Encounter for screening for malignant neoplasm of rectum: Secondary | ICD-10-CM

## 2013-07-15 DIAGNOSIS — I10 Essential (primary) hypertension: Secondary | ICD-10-CM | POA: Diagnosis not present

## 2013-07-15 DIAGNOSIS — E291 Testicular hypofunction: Secondary | ICD-10-CM | POA: Diagnosis not present

## 2013-07-15 DIAGNOSIS — R110 Nausea: Secondary | ICD-10-CM

## 2013-07-15 LAB — HEPATIC FUNCTION PANEL
ALK PHOS: 50 U/L (ref 39–117)
ALT: 33 U/L (ref 0–53)
AST: 25 U/L (ref 0–37)
Albumin: 4.4 g/dL (ref 3.5–5.2)
BILIRUBIN DIRECT: 0.1 mg/dL (ref 0.0–0.3)
BILIRUBIN TOTAL: 0.6 mg/dL (ref 0.2–1.2)
Indirect Bilirubin: 0.5 mg/dL (ref 0.2–1.2)
Total Protein: 6.8 g/dL (ref 6.0–8.3)

## 2013-07-15 LAB — CBC WITH DIFFERENTIAL/PLATELET
BASOS ABS: 0.1 10*3/uL (ref 0.0–0.1)
Basophils Relative: 1 % (ref 0–1)
Eosinophils Absolute: 0.2 10*3/uL (ref 0.0–0.7)
Eosinophils Relative: 3 % (ref 0–5)
HCT: 45.3 % (ref 39.0–52.0)
Hemoglobin: 15.7 g/dL (ref 13.0–17.0)
LYMPHS ABS: 1.4 10*3/uL (ref 0.7–4.0)
Lymphocytes Relative: 20 % (ref 12–46)
MCH: 31.5 pg (ref 26.0–34.0)
MCHC: 34.7 g/dL (ref 30.0–36.0)
MCV: 91 fL (ref 78.0–100.0)
Monocytes Absolute: 0.4 10*3/uL (ref 0.1–1.0)
Monocytes Relative: 6 % (ref 3–12)
NEUTROS ABS: 5 10*3/uL (ref 1.7–7.7)
Neutrophils Relative %: 70 % (ref 43–77)
PLATELETS: 217 10*3/uL (ref 150–400)
RBC: 4.98 MIL/uL (ref 4.22–5.81)
RDW: 14.2 % (ref 11.5–15.5)
WBC: 7.1 10*3/uL (ref 4.0–10.5)

## 2013-07-15 LAB — LIPID PANEL
CHOL/HDL RATIO: 5.5 ratio
Cholesterol: 165 mg/dL (ref 0–200)
HDL: 30 mg/dL — AB (ref >39)
LDL CALC: 76 mg/dL (ref 0–99)
Triglycerides: 294 mg/dL — ABNORMAL HIGH (ref <150)
VLDL: 59 mg/dL — ABNORMAL HIGH (ref 0–40)

## 2013-07-15 LAB — BASIC METABOLIC PANEL WITH GFR
BUN: 11 mg/dL (ref 6–23)
CHLORIDE: 98 meq/L (ref 96–112)
CO2: 37 meq/L — AB (ref 19–32)
Calcium: 9.6 mg/dL (ref 8.4–10.5)
Creat: 1.11 mg/dL (ref 0.50–1.35)
GFR, EST NON AFRICAN AMERICAN: 72 mL/min
GFR, Est African American: 83 mL/min
Glucose, Bld: 102 mg/dL — ABNORMAL HIGH (ref 70–99)
POTASSIUM: 3.9 meq/L (ref 3.5–5.3)
SODIUM: 141 meq/L (ref 135–145)

## 2013-07-15 LAB — TSH: TSH: 2.055 u[IU]/mL (ref 0.350–4.500)

## 2013-07-15 LAB — HEMOGLOBIN A1C
Hgb A1c MFr Bld: 5.3 % (ref <5.7)
Mean Plasma Glucose: 105 mg/dL (ref <117)

## 2013-07-15 LAB — MAGNESIUM: Magnesium: 1.9 mg/dL (ref 1.5–2.5)

## 2013-07-15 MED ORDER — ONDANSETRON HCL 8 MG PO TABS: ORAL_TABLET | ORAL | Status: DC

## 2013-07-15 MED ORDER — ALPRAZOLAM 1 MG PO TABS: ORAL_TABLET | ORAL | Status: DC

## 2013-07-16 LAB — MICROALBUMIN / CREATININE URINE RATIO
Creatinine, Urine: 77.8 mg/dL
MICROALB UR: 0.5 mg/dL (ref 0.00–1.89)
Microalb Creat Ratio: 6.4 mg/g (ref 0.0–30.0)

## 2013-07-16 LAB — URINALYSIS, MICROSCOPIC ONLY
BACTERIA UA: NONE SEEN
Casts: NONE SEEN
Crystals: NONE SEEN
Squamous Epithelial / LPF: NONE SEEN

## 2013-07-16 LAB — VITAMIN D 25 HYDROXY (VIT D DEFICIENCY, FRACTURES): Vit D, 25-Hydroxy: 87 ng/mL (ref 30–89)

## 2013-07-16 LAB — TESTOSTERONE: TESTOSTERONE: 834 ng/dL (ref 300–890)

## 2013-07-16 LAB — INSULIN, FASTING: Insulin fasting, serum: 60 u[IU]/mL — ABNORMAL HIGH (ref 3–28)

## 2013-07-16 LAB — PSA: PSA: 2 ng/mL (ref <=4.00)

## 2013-07-29 ENCOUNTER — Other Ambulatory Visit: Payer: Self-pay | Admitting: Physician Assistant

## 2013-07-29 MED ORDER — TESTOSTERONE CYPIONATE 200 MG/ML IM SOLN: INTRAMUSCULAR | Status: DC

## 2013-08-12 DIAGNOSIS — Z87891 Personal history of nicotine dependence: Secondary | ICD-10-CM | POA: Diagnosis not present

## 2013-08-12 DIAGNOSIS — M519 Unspecified thoracic, thoracolumbar and lumbosacral intervertebral disc disorder: Secondary | ICD-10-CM | POA: Diagnosis not present

## 2013-08-12 DIAGNOSIS — Z79899 Other long term (current) drug therapy: Secondary | ICD-10-CM | POA: Diagnosis not present

## 2013-08-12 DIAGNOSIS — M961 Postlaminectomy syndrome, not elsewhere classified: Secondary | ICD-10-CM | POA: Diagnosis not present

## 2013-08-12 DIAGNOSIS — M159 Polyosteoarthritis, unspecified: Secondary | ICD-10-CM | POA: Diagnosis not present

## 2013-08-12 DIAGNOSIS — Z981 Arthrodesis status: Secondary | ICD-10-CM | POA: Diagnosis not present

## 2013-08-12 DIAGNOSIS — Z882 Allergy status to sulfonamides status: Secondary | ICD-10-CM | POA: Diagnosis not present

## 2013-08-19 ENCOUNTER — Other Ambulatory Visit (INDEPENDENT_AMBULATORY_CARE_PROVIDER_SITE_OTHER): Payer: Medicare Other | Admitting: *Deleted

## 2013-08-19 DIAGNOSIS — Z1212 Encounter for screening for malignant neoplasm of rectum: Secondary | ICD-10-CM | POA: Diagnosis not present

## 2013-08-19 LAB — POC HEMOCCULT BLD/STL (HOME/3-CARD/SCREEN)
Card #2 Fecal Occult Blod, POC: NEGATIVE
Card #3 Fecal Occult Blood, POC: NEGATIVE
Fecal Occult Blood, POC: NEGATIVE

## 2013-09-02 ENCOUNTER — Other Ambulatory Visit: Payer: Self-pay | Admitting: *Deleted

## 2013-09-02 MED ORDER — TESTOSTERONE CYPIONATE 200 MG/ML IM SOLN: INTRAMUSCULAR | Status: DC

## 2013-09-10 DIAGNOSIS — M961 Postlaminectomy syndrome, not elsewhere classified: Secondary | ICD-10-CM | POA: Diagnosis not present

## 2013-10-07 ENCOUNTER — Other Ambulatory Visit: Payer: Self-pay | Admitting: Internal Medicine

## 2013-10-07 DIAGNOSIS — J309 Allergic rhinitis, unspecified: Secondary | ICD-10-CM

## 2013-10-07 MED ORDER — AZELASTINE HCL 0.1 % NA SOLN: NASAL | Status: DC

## 2013-10-07 MED ORDER — FLUTICASONE PROPIONATE 50 MCG/ACT NA SUSP: NASAL | Status: DC

## 2013-10-23 ENCOUNTER — Ambulatory Visit: Payer: Self-pay | Admitting: Physician Assistant

## 2013-11-08 DIAGNOSIS — M961 Postlaminectomy syndrome, not elsewhere classified: Secondary | ICD-10-CM | POA: Diagnosis not present

## 2013-11-08 DIAGNOSIS — E669 Obesity, unspecified: Secondary | ICD-10-CM | POA: Diagnosis not present

## 2013-12-12 ENCOUNTER — Other Ambulatory Visit: Payer: Self-pay | Admitting: Physician Assistant

## 2014-01-07 DIAGNOSIS — G8929 Other chronic pain: Secondary | ICD-10-CM | POA: Diagnosis not present

## 2014-01-07 DIAGNOSIS — M961 Postlaminectomy syndrome, not elsewhere classified: Secondary | ICD-10-CM | POA: Diagnosis not present

## 2014-01-07 DIAGNOSIS — F411 Generalized anxiety disorder: Secondary | ICD-10-CM | POA: Diagnosis not present

## 2014-01-10 ENCOUNTER — Other Ambulatory Visit: Payer: Self-pay | Admitting: Physician Assistant

## 2014-01-21 DIAGNOSIS — M545 Low back pain, unspecified: Secondary | ICD-10-CM | POA: Diagnosis not present

## 2014-01-21 DIAGNOSIS — M5020 Other cervical disc displacement, unspecified cervical region: Secondary | ICD-10-CM | POA: Diagnosis not present

## 2014-01-21 DIAGNOSIS — Z01812 Encounter for preprocedural laboratory examination: Secondary | ICD-10-CM | POA: Diagnosis not present

## 2014-01-21 DIAGNOSIS — M48061 Spinal stenosis, lumbar region without neurogenic claudication: Secondary | ICD-10-CM | POA: Diagnosis not present

## 2014-01-23 ENCOUNTER — Other Ambulatory Visit: Payer: Self-pay | Admitting: Orthopaedic Surgery

## 2014-01-23 DIAGNOSIS — M545 Low back pain, unspecified: Secondary | ICD-10-CM

## 2014-02-04 ENCOUNTER — Ambulatory Visit (INDEPENDENT_AMBULATORY_CARE_PROVIDER_SITE_OTHER): Payer: Medicare Other | Admitting: Internal Medicine

## 2014-02-04 ENCOUNTER — Encounter: Payer: Self-pay | Admitting: Internal Medicine

## 2014-02-04 VITALS — BP 102/66 | HR 64 | Temp 99.3°F | Resp 16 | Ht 75.0 in | Wt 264.6 lb

## 2014-02-04 DIAGNOSIS — E782 Mixed hyperlipidemia: Secondary | ICD-10-CM

## 2014-02-04 DIAGNOSIS — Z0000 Encounter for general adult medical examination without abnormal findings: Secondary | ICD-10-CM | POA: Diagnosis not present

## 2014-02-04 DIAGNOSIS — Z79899 Other long term (current) drug therapy: Secondary | ICD-10-CM

## 2014-02-04 DIAGNOSIS — E559 Vitamin D deficiency, unspecified: Secondary | ICD-10-CM

## 2014-02-04 DIAGNOSIS — R7309 Other abnormal glucose: Secondary | ICD-10-CM

## 2014-02-04 DIAGNOSIS — N182 Chronic kidney disease, stage 2 (mild): Secondary | ICD-10-CM | POA: Insufficient documentation

## 2014-02-04 DIAGNOSIS — I10 Essential (primary) hypertension: Secondary | ICD-10-CM | POA: Diagnosis not present

## 2014-02-04 LAB — CBC WITH DIFFERENTIAL/PLATELET
BASOS ABS: 0.1 10*3/uL (ref 0.0–0.1)
Basophils Relative: 1 % (ref 0–1)
EOS PCT: 3 % (ref 0–5)
Eosinophils Absolute: 0.2 10*3/uL (ref 0.0–0.7)
HEMATOCRIT: 46.1 % (ref 39.0–52.0)
HEMOGLOBIN: 16.4 g/dL (ref 13.0–17.0)
LYMPHS PCT: 20 % (ref 12–46)
Lymphs Abs: 1.6 10*3/uL (ref 0.7–4.0)
MCH: 31.1 pg (ref 26.0–34.0)
MCHC: 35.6 g/dL (ref 30.0–36.0)
MCV: 87.5 fL (ref 78.0–100.0)
MONO ABS: 0.6 10*3/uL (ref 0.1–1.0)
MONOS PCT: 7 % (ref 3–12)
NEUTROS ABS: 5.7 10*3/uL (ref 1.7–7.7)
Neutrophils Relative %: 69 % (ref 43–77)
Platelets: 241 10*3/uL (ref 150–400)
RBC: 5.27 MIL/uL (ref 4.22–5.81)
RDW: 13.4 % (ref 11.5–15.5)
WBC: 8.2 10*3/uL (ref 4.0–10.5)

## 2014-02-04 NOTE — Progress Notes (Signed)
Patient ID: Jared [name], male   DOB: June 11, 1955, 58 y.o.   MRN: [id number]  MEDICARE ANNUAL WELLNESS VISIT AND Quarterly OV  Assessment:   1. Essential hypertension - TSH  2. Mixed hyperlipidemia - Lipid panel  3. PreDiabetes - Hemoglobin A1c - Insulin, fasting  4. Unspecified vitamin D deficiency - Vit D  25   5. Encounter for long-term (current) use of other medications - CBC with Differential - BASIC METABOLIC PANEL WITH GFR - Hepatic function panel - Magnesium  6. CKD Stage 2 (GFR 72 ml/min)  7. Routine medical examination  And screening labs at a health care facility  8. OSA on CPAP  9. GERD  10. Testosterone Deficiency  11. 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 Morbid Obesity (BMI 33+)  Plan:   During the course of the visit the patient was educated and counseled about appropriate screening and preventive services including:    Pneumococcal vaccine   Influenza vaccine  Td vaccine  Screening electrocardiogram  Screening mammography  Bone densitometry screening  Colorectal cancer screening  Diabetes screening  Glaucoma screening  Nutrition counseling   Advanced directives: given information/requested  Screening recommendations, referrals:  Vaccinations: Tdap vaccine not indicated Influenza vaccine requested Pneumococcal vaccine not indicated Shingles vaccine requested Hep B vaccine not indicated  Nutrition assessed and recommended  Colonoscopy not indicated Recommended yearly ophthalmology/optometry visit for glaucoma screening and checkup Recommended yearly dental visit for hygiene and checkup Advanced directives - declined  Conditions/risks identified: BMI: Discussed weight loss, diet, and increase physical activity.  Increase physical activity: AHA recommends 150 minutes of physical activity a week.  Medications reviewed DEXA- not indicated PreDiabetes at goal,ARB therapy No, Reason not on  Ace Inhibitor/ARB therapy:  Patient on ARB. Urinary Incontinence is not an issue: discussed non pharmacology and pharmacology options.  Fall risk: low- discussed PT, home fall assessment, medications.   Subjective:   Jared [name] is a 58 y.o. MWM who presents for Medicare Annual Wellness Visit and complete physical.    Date of last medicare wellness visit is unknown.  He has had elevated blood pressure since 1996. His blood pressure has been controlled at home, today their BP is BP: 102/66 mmHg He does not workout. He denies chest pain, shortness of breath, dizziness.  He is on cholesterol medication and denies myalgias. His cholesterol is at goal. The cholesterol last visit was:   Lab Results  Component Value Date   CHOL 165 07/15/2013   HDL 30* 07/15/2013   LDLCALC 76 07/15/2013   TRIG 294* 07/15/2013   CHOLHDL 5.5 07/15/2013   He has had prediabetes for 3 years (Feb 2012). He has been working on diet and exercise for  prediabetes, and denies foot ulcerations, hyperglycemia, hypoglycemia , nausea, paresthesia of the feet, polyuria and visual disturbances. Last A1C in the office was:  Lab Results  Component Value Date   HGBA1C 5.3 07/15/2013   Patient is on Vitamin D supplement.   Lab Results  Component Value Date   VD25OH 87 07/15/2013       Names of Other Physician/Practitioners you currently use: 1. [facility]- here for primary care 2. Dr Nelle Don, eye doctor, last visit 01/2014 3. Dr minor, dentist, last visit 12/2013 Patient Care Team: Lucky Cowboy, MD as PCP - General (Internal Medicine) Eldred Manges, MD as Consulting Physician (Orthopedic Surgery) Iva Boop, MD as Consulting Physician (Gastroenterology)   Medication Review Current Outpatient  Prescriptions on File Prior to Visit  Medication Sig Dispense Refill  . ALPRAZolam (XANAX) 1 MG tablet 1/2 to 1 tablet up to 3 x daily as needed for anxiety or sleep  90 tablet  5  .  atorvastatin (LIPITOR) 40 MG tablet TAKE 1 TABLET DAILY OR AS DIRECTED FOR CHOLESTEROL  90 tablet  0  . azelastine (ASTELIN) 0.1 % nasal spray 1 to 2 sprays each nostril every 12 hours  30 mL  99  . bisoprolol-hydrochlorothiazide (ZIAC) 10-6.25 MG per tablet Take 1 tablet by mouth daily.      . Cholecalciferol (VITAMIN D-3) 5000 UNITS TABS Take 1 tablet by mouth 3 (three) times daily.      . citalopram (CELEXA) 40 MG tablet Take 40 mg by mouth daily.      . Flaxseed, Linseed, (FLAXSEED OIL PO) Take 1 tablet by mouth daily.        Marland Kitchen losartan-hydrochlorothiazide (HYZAAR) 100-25 MG per tablet Take 1 tablet by mouth daily.      . minoxidil (LONITEN) 10 MG tablet TAKE 1/4 (= 2.5 MG) TO 1/2 ( = 5 MG) TO 1 TAB (= 10 MG ) AT BEDTIME AS DIRECTED TO CONTROL BP  90 tablet  0  . montelukast (SINGULAIR) 10 MG tablet TAKE 1 TABLET EVERY DAY  90 tablet  4  . Omega-3 Fatty Acids (FISH OIL BURP-LESS PO) Take 1 tablet by mouth daily.        . ondansetron (ZOFRAN) 8 MG tablet 1/2 to 1 tablet up to 3 x daily as needed for nausea.  30 tablet  3  . oxycodone (ROXICODONE) 30 MG immediate release tablet Take 30 mg by mouth every 4 (four) hours as needed for pain. Max dose of 240 mg in 24 hours      . testosterone cypionate (DEPOTESTOTERONE CYPIONATE) 200 MG/ML injection Inject 2 cc IM every 2 weeks or as directed by a physician.  10 mL  2   No current facility-administered medications on file prior to visit.    Current Problems (verified) Patient Active Problem List   Diagnosis Date Noted  . CKD Stage 2 (GFR 72 ml/min) 02/04/2014  . Encounter for long-term (current) use of other medications 07/14/2013  . Mixed hyperlipidemia 05/22/2013  . PreDiabetes 05/22/2013  . OSA on CPAP 05/22/2013  . Reflux esophagitis 05/22/2013  . Other testicular hypofunction 05/22/2013  . Unspecified vitamin D deficiency 05/22/2013  . DDD (degenerative disc disease), lumbar 05/22/2013  . Chronic pain syndrome 05/22/2013  . HTN  (hypertension) 01/20/2011    Screening Tests Health Maintenance  Topic Date Due  . Tetanus/tdap  06/22/1972  . Colonoscopy  06/23/2003  . Zostavax  06/22/2013  . Influenza Vaccine  12/21/2013    Immunization History  Administered Date(s) Administered  . Influenza-Unspecified 05/12/2011  . Pneumococcal-Unspecified 05/23/2006  . Td 03/24/2005    Preventative care: Last colonoscopy: 2006    DEXA: Not indicated  Prior vaccinations: TD or Tdap: 2006  Influenza: declined  Pneumococcal: 2006 Shingles/Zostavax: deferred  History reviewed: allergies, current medications, past family history, past medical history, past social history, past surgical history and problem list  Risk Factors: Osteoporosis: hypogonadism History of fracture in the past year: no  Tobacco History  Substance Use Topics  . Smoking status: Former Games developer  . Smokeless tobacco: Not on file  . Alcohol Use: No     Comment: rarely   He does not smoke.  Patient is a former smoker. Are there smokers in your home (  other than you)?  No  Alcohol Current alcohol use: social drinker  Caffeine Current caffeine use: coffee 1-3 /day  Exercise  Current exercise: none  Nutrition/Diet Current diet: in general, an "unhealthy" diet  Cardiac risk factors: advanced age (older than 73 for men, 91 for women), diabetes mellitus, dyslipidemia, hypertension, male gender, obesity (BMI >= 30 kg/m2) and sedentary lifestyle.  Depression Screen (Note: if answer to either of the following is "Yes", a more complete depression screening is indicated)   Q1: Over the past two weeks, have you felt down, depressed or hopeless? No  Q2: Over the past two weeks, have you felt little interest or pleasure in doing things? No  Have you lost interest or pleasure in daily life? No  Do you often feel hopeless? No  Do you cry easily over simple problems? No  Activities of Daily Living In your present state of health, do you have any  difficulty performing the following activities?:  Driving? No Managing money?  No Feeding yourself? No Getting from bed to chair? Yes Climbing a flight of stairs? Yes Preparing food and eating?: No Bathing or showering? No Getting dressed: No Getting to the toilet? No Using the toilet:No Moving around from place to place: No In the past year have you fallen or had a near fall?:Yes   Are you sexually active?  Yes  Do you have more than one partner?  No  Vision Difficulties: No  Hearing Difficulties: No Do you often ask people to speak up or repeat themselves? No Do you experience ringing or noises in your ears? No Do you have difficulty understanding soft or whispered voices? No  Cognition  Do you feel that you have a problem with memory?No  Do you often misplace items? No  Do you feel safe at home?  Yes  Advanced directives Does patient have a Health Care Power of Attorney? No Does patient have a Living Will? No   Objective:     Blood pressure 102/66, pulse 64, temperature 99.3 F (37.4 C), temperature source Temporal, resp. rate 16, height  (1.905 m), weight 264 lb 9.6 oz (120.022 kg). Body mass index is 33.07 kg/(m^2).  General appearance: alert, no distress, WD/WN,  male Cognitive Testing  Alert? Yes  Normal Appearance?Yes  Oriented to person? Yes  Place? Yes   Time? Yes  Recall of three objects?  Yes  Can perform simple calculations? Yes  Displays appropriate judgment?Yes  Can read the correct time from a watch face?Yes  HEENT: normocephalic, sclerae anicteric, TMs pearly, nares patent, no discharge or erythema, pharynx normal Oral cavity: MMM, no lesions Neck: supple, no lymphadenopathy, no thyromegaly, no masses Heart: RRR, normal S1, S2, no murmurs Lungs: CTA bilaterally, no wheezes, rhonchi, or rales Abdomen: +bs, soft, non tender, non distended, no masses, no hepatomegaly, no splenomegaly Musculoskeletal: nontender, no swelling, no obvious  deformity Extremities: no edema, no cyanosis, no clubbing Pulses: 2+ symmetric, upper and lower extremities, normal cap refill Neurological: alert, oriented x 3, CN2-12 intact, strength normal upper extremities and lower extremities, sensation normal throughout, DTRs 2+ throughout, no cerebellar signs, gait normal Psychiatric: normal affect, behavior normal, pleasant  No lymphadenopathy  Rectal: defer to annual Exam  Medicare Attestation I have personally reviewed: The patient's medical and social history Their use of alcohol, tobacco or illicit drugs Their current medications and supplements The patient's functional ability including ADLs,fall risks, home safety risks, cognitive, and hearing and visual impairment Diet and physical activities Evidence for depression  or mood disorders  The patient's weight, height, BMI, and visual acuity have been recorded in the chart.  I have made referrals, counseling, and provided education to the patient based on review of the above and I have provided the patient with a written personalized care plan for preventive services.    Om Lizotte DAVID, MD   02/04/2014

## 2014-02-04 NOTE — Patient Instructions (Signed)

## 2014-02-05 DIAGNOSIS — M961 Postlaminectomy syndrome, not elsewhere classified: Secondary | ICD-10-CM | POA: Diagnosis not present

## 2014-02-05 DIAGNOSIS — E669 Obesity, unspecified: Secondary | ICD-10-CM | POA: Diagnosis not present

## 2014-02-05 LAB — BASIC METABOLIC PANEL WITH GFR
BUN: 10 mg/dL (ref 6–23)
CALCIUM: 9.6 mg/dL (ref 8.4–10.5)
CO2: 33 mEq/L — ABNORMAL HIGH (ref 19–32)
Chloride: 96 mEq/L (ref 96–112)
Creat: 1.25 mg/dL (ref 0.50–1.35)
GFR, Est African American: 72 mL/min
GFR, Est Non African American: 62 mL/min
GLUCOSE: 93 mg/dL (ref 70–99)
Potassium: 3.3 mEq/L — ABNORMAL LOW (ref 3.5–5.3)
SODIUM: 137 meq/L (ref 135–145)

## 2014-02-05 LAB — LIPID PANEL
CHOL/HDL RATIO: 4.5 ratio
Cholesterol: 117 mg/dL (ref 0–200)
HDL: 26 mg/dL — ABNORMAL LOW (ref >39)
LDL Cholesterol: 39 mg/dL (ref 0–99)
Triglycerides: 259 mg/dL — ABNORMAL HIGH (ref <150)
VLDL: 52 mg/dL — ABNORMAL HIGH (ref 0–40)

## 2014-02-05 LAB — HEPATIC FUNCTION PANEL
ALT: 13 U/L (ref 0–53)
AST: 15 U/L (ref 0–37)
Albumin: 4.1 g/dL (ref 3.5–5.2)
Alkaline Phosphatase: 51 U/L (ref 39–117)
BILIRUBIN DIRECT: 0.1 mg/dL (ref 0.0–0.3)
Indirect Bilirubin: 0.3 mg/dL (ref 0.2–1.2)
Total Bilirubin: 0.4 mg/dL (ref 0.2–1.2)
Total Protein: 6.5 g/dL (ref 6.0–8.3)

## 2014-02-05 LAB — TSH: TSH: 2.712 u[IU]/mL (ref 0.350–4.500)

## 2014-02-05 LAB — HEMOGLOBIN A1C
Hgb A1c MFr Bld: 5.6 % (ref <5.7)
MEAN PLASMA GLUCOSE: 114 mg/dL (ref <117)

## 2014-02-05 LAB — VITAMIN D 25 HYDROXY (VIT D DEFICIENCY, FRACTURES): Vit D, 25-Hydroxy: 87 ng/mL (ref 30–89)

## 2014-02-05 LAB — INSULIN, FASTING: Insulin fasting, serum: 24.6 u[IU]/mL — ABNORMAL HIGH (ref 2.0–19.6)

## 2014-02-05 LAB — MAGNESIUM: Magnesium: 1.7 mg/dL (ref 1.5–2.5)

## 2014-02-06 ENCOUNTER — Ambulatory Visit
Admission: RE | Admit: 2014-02-06 | Discharge: 2014-02-06 | Disposition: A | Discharge status: Home / Self Care | Payer: Medicare Other | Source: Ambulatory Visit | Attending: Orthopaedic Surgery | Admitting: Orthopaedic Surgery

## 2014-02-06 DIAGNOSIS — M545 Low back pain, unspecified: Secondary | ICD-10-CM

## 2014-02-06 DIAGNOSIS — M47817 Spondylosis without myelopathy or radiculopathy, lumbosacral region: Secondary | ICD-10-CM | POA: Diagnosis not present

## 2014-02-06 DIAGNOSIS — M5137 Other intervertebral disc degeneration, lumbosacral region: Secondary | ICD-10-CM | POA: Diagnosis not present

## 2014-02-06 MED ORDER — GADOBENATE DIMEGLUMINE 529 MG/ML IV SOLN
20.0000 mL | Freq: Once | INTRAVENOUS | Status: AC | PRN
  Administered 2014-02-06: 20 mL via INTRAVENOUS

## 2014-02-07 DIAGNOSIS — M5020 Other cervical disc displacement, unspecified cervical region: Secondary | ICD-10-CM | POA: Diagnosis not present

## 2014-02-07 DIAGNOSIS — M545 Low back pain, unspecified: Secondary | ICD-10-CM | POA: Diagnosis not present

## 2014-02-07 DIAGNOSIS — M48061 Spinal stenosis, lumbar region without neurogenic claudication: Secondary | ICD-10-CM | POA: Diagnosis not present

## 2014-02-07 DIAGNOSIS — M5412 Radiculopathy, cervical region: Secondary | ICD-10-CM | POA: Diagnosis not present

## 2014-03-12 ENCOUNTER — Other Ambulatory Visit: Payer: Self-pay | Admitting: Internal Medicine

## 2014-03-23 ENCOUNTER — Other Ambulatory Visit: Payer: Self-pay | Admitting: Physician Assistant

## 2014-04-01 ENCOUNTER — Other Ambulatory Visit: Payer: Self-pay | Admitting: Internal Medicine

## 2014-04-07 DIAGNOSIS — M961 Postlaminectomy syndrome, not elsewhere classified: Secondary | ICD-10-CM | POA: Diagnosis not present

## 2014-04-07 DIAGNOSIS — E669 Obesity, unspecified: Secondary | ICD-10-CM | POA: Diagnosis not present

## 2014-04-21 ENCOUNTER — Encounter: Payer: Self-pay | Admitting: Physician Assistant

## 2014-04-21 ENCOUNTER — Ambulatory Visit (INDEPENDENT_AMBULATORY_CARE_PROVIDER_SITE_OTHER): Payer: Medicare Other | Admitting: Physician Assistant

## 2014-04-21 VITALS — BP 138/78 | HR 60 | Temp 97.7°F | Resp 16 | Wt 277.0 lb

## 2014-04-21 DIAGNOSIS — J0100 Acute maxillary sinusitis, unspecified: Secondary | ICD-10-CM

## 2014-04-21 MED ORDER — PROMETHAZINE-CODEINE 6.25-10 MG/5ML PO SYRP
5.0000 mL | ORAL_SOLUTION | Freq: Four times a day (QID) | ORAL | Status: DC | PRN
Start: 2014-04-21 — End: 2014-05-01

## 2014-04-21 MED ORDER — AZITHROMYCIN 250 MG PO TABS: ORAL_TABLET | ORAL | Status: AC

## 2014-04-21 MED ORDER — PREDNISONE 20 MG PO TABS: ORAL_TABLET | ORAL | Status: DC

## 2014-04-21 NOTE — Patient Instructions (Signed)
-Please take Tylenol or Ibuprofen for pain. -Acetaminiphen 325mg orally every 4-6 hours for pain.  Max: 10 per day -Ibuprofen 200mg orally every 6-8 hours for pain.  Take with food to avoid ulcers.   Max 10 per day Please pick one of the over the counter allergy medications below and take it once daily for allergies.  Claritin or loratadine cheapest but likely the weakest  Zyrtec or certizine at night because it can make you sleepy The strongest is allegra or fexafinadine  Cheapest at [employer], [employer], [employer] -While drinking fluids, pinch and hold nose close and swallow.  This will help open up your eustachian tubes to drain the fluid behind your ear drums. -Try steam showers to open your nasal passages.   Drink lots of water to stay hydrated and to thin mucous. Flonase/Nasonex is to help the inflammation.  Take 2 sprays in each nostril at bedtime.  Make sure you spray towards the outside of each nostril towards the outer corner of your eye, hold nose close and tilt head back.  This will help the medication get into your sinuses.  If you do not like this medication, then use saline nasal sprays same directions as above for Flonase.  -It can take up to 2 weeks to feel better.  Sinusitis is mostly caused by viruses.  -If you do not get better in 7-10 days (Have fever, facial pain, dental pain and swelling), then please call the office and I can send in an antibiotic.  Sinusitis Sinusitis is redness, soreness, and inflammation of the paranasal sinuses. Paranasal sinuses are air pockets within the bones of your face (beneath the eyes, the middle of the forehead, or above the eyes). In healthy paranasal sinuses, mucus is able to drain out, and air is able to circulate through them by way of your nose. However, when your paranasal sinuses are inflamed, mucus and air can become trapped. This can allow bacteria and other germs to grow and cause infection. Sinusitis can develop quickly and last only a short  time (acute) or continue over a long period (chronic). Sinusitis that lasts for more than 12 weeks is considered chronic.  CAUSES  Causes of sinusitis include:  Allergies.  Structural abnormalities, such as displacement of the cartilage that separates your nostrils (deviated septum), which can decrease the air flow through your nose and sinuses and affect sinus drainage.  Functional abnormalities, such as when the small hairs (cilia) that line your sinuses and help remove mucus do not work properly or are not present. SIGNS AND SYMPTOMS  Symptoms of acute and chronic sinusitis are the same. The primary symptoms are pain and pressure around the affected sinuses. Other symptoms include:  Upper toothache.  Earache.  Headache.  Bad breath.  Decreased sense of smell and taste.  A cough, which worsens when you are lying flat.  Fatigue.  Fever.  Thick drainage from your nose, which often is green and may contain pus (purulent).  Swelling and warmth over the affected sinuses. DIAGNOSIS  Your health care provider will perform a physical exam. During the exam, your health care provider may:  Look in your nose for signs of abnormal growths in your nostrils (nasal polyps).  Tap over the affected sinus to check for signs of infection.  View the inside of your sinuses (endoscopy) using an imaging device that has a light attached (endoscope). If your health care provider suspects that you have chronic sinusitis, one or more of the following tests may be   recommended:  Allergy tests.  Nasal culture. A sample of mucus is taken from your nose, sent to a lab, and screened for bacteria.  Nasal cytology. A sample of mucus is taken from your nose and examined by your health care provider to determine if your sinusitis is related to an allergy. TREATMENT  Most cases of acute sinusitis are related to a viral infection and will resolve on their own within 10 days. Sometimes medicines are  prescribed to help relieve symptoms (pain medicine, decongestants, nasal steroid sprays, or saline sprays).  However, for sinusitis related to a bacterial infection, your health care provider will prescribe antibiotic medicines. These are medicines that will help kill the bacteria causing the infection.  Rarely, sinusitis is caused by a fungal infection. In theses cases, your health care provider will prescribe antifungal medicine. For some cases of chronic sinusitis, surgery is needed. Generally, these are cases in which sinusitis recurs more than 3 times per year, despite other treatments. HOME CARE INSTRUCTIONS   Drink plenty of water. Water helps thin the mucus so your sinuses can drain more easily.  Use a humidifier.  Inhale steam 3 to 4 times a day (for example, sit in the bathroom with the shower running).  Apply a warm, moist washcloth to your face 3 to 4 times a day, or as directed by your health care provider.  Use saline nasal sprays to help moisten and clean your sinuses.  Take medicines only as directed by your health care provider.  If you were prescribed either an antibiotic or antifungal medicine, finish it all even if you start to feel better. SEEK IMMEDIATE MEDICAL CARE IF:  You have increasing pain or severe headaches.  You have nausea, vomiting, or drowsiness.  You have swelling around your face.  You have vision problems.  You have a stiff neck.  You have difficulty breathing. MAKE SURE YOU:   Understand these instructions.  Will watch your condition.  Will get help right away if you are not doing well or get worse. Document Released: 05/09/2005 Document Revised: 09/23/2013 Document Reviewed: 05/24/2011 [facility] Patient Information 2015 [place], [place]. This information is not intended to replace advice given to you by your health care provider. Make sure you discuss any questions you have with your health care provider.

## 2014-04-21 NOTE — Progress Notes (Signed)
   Subjective:    Patient ID: Jared [name], male    DOB: 10/08/1955, 58 y.o.   MRN: [id number]  Sinus Problem This is a new problem. Episode onset: 1 week. The problem has been gradually worsening since onset. Associated symptoms include chills, congestion, coughing, a hoarse voice and sinus pressure. Pertinent negatives include no diaphoresis, ear pain, headaches, neck pain, shortness of breath, sneezing, sore throat or swollen glands. Past treatments include acetaminophen and saline sprays. The treatment provided no relief.    Review of Systems  Constitutional: Positive for chills. Negative for fever and diaphoresis.  HENT: Positive for congestion, hoarse voice and sinus pressure. Negative for ear pain, sneezing, sore throat, trouble swallowing and voice change.   Eyes: Negative.   Respiratory: Positive for cough. Negative for chest tightness, shortness of breath and wheezing.   Cardiovascular: Negative.   Gastrointestinal: Negative.   Genitourinary: Negative.   Musculoskeletal: Negative for neck pain.  Neurological: Negative.  Negative for headaches.       Objective:   Physical Exam  Constitutional: He is oriented to person, place, and time. He appears well-developed and well-nourished.  HENT:  Head: Normocephalic and atraumatic.  Right Ear: Hearing and tympanic membrane normal.  Left Ear: Hearing and tympanic membrane normal.  Nose: Right sinus exhibits maxillary sinus tenderness. Left sinus exhibits maxillary sinus tenderness.  Mouth/Throat: Uvula is midline, oropharynx is clear and moist and mucous membranes are normal. No posterior oropharyngeal edema or posterior oropharyngeal erythema.  Eyes: Conjunctivae are normal. Pupils are equal, round, and reactive to light.  Neck: Normal range of motion. Neck supple.  Cardiovascular: Normal rate and regular rhythm.   Pulmonary/Chest: Effort normal and breath sounds normal.  Abdominal: Soft. Bowel sounds are normal.  Musculoskeletal:  Normal range of motion.  Lymphadenopathy:    He has no cervical adenopathy.  Neurological: He is alert and oriented to person, place, and time.  Skin: Skin is warm and dry. No rash noted.       Assessment & Plan:  1. Acute maxillary sinusitis, recurrence not specified [J01.00] - predniSONE (DELTASONE) 20 MG tablet; Take one pill two times daily for 3 days, take one pill daily for 4 days.  Dispense: 10 tablet; Refill: 0 - azithromycin (ZITHROMAX) 250 MG tablet; Take 2 tablets (500 mg) on  Day 1,  followed by 1 tablet (250 mg) once daily on Days 2 through 5.  Dispense: 6 each; Refill: 1 - promethazine-codeine (PHENERGAN WITH CODEINE) 6.25-10 MG/5ML syrup; Take 5 mLs by mouth every 6 (six) hours as needed for cough.  Dispense: 240 mL; Refill: 0

## 2014-05-01 ENCOUNTER — Other Ambulatory Visit: Payer: Self-pay | Admitting: Physician Assistant

## 2014-05-07 ENCOUNTER — Other Ambulatory Visit: Payer: Self-pay | Admitting: Physician Assistant

## 2014-05-12 ENCOUNTER — Ambulatory Visit: Payer: Self-pay | Admitting: Physician Assistant

## 2014-06-05 DIAGNOSIS — M961 Postlaminectomy syndrome, not elsewhere classified: Secondary | ICD-10-CM | POA: Diagnosis not present

## 2014-06-05 DIAGNOSIS — E669 Obesity, unspecified: Secondary | ICD-10-CM | POA: Diagnosis not present

## 2014-06-16 ENCOUNTER — Other Ambulatory Visit: Payer: Self-pay | Admitting: Emergency Medicine

## 2014-06-18 ENCOUNTER — Other Ambulatory Visit: Payer: Self-pay | Admitting: Internal Medicine

## 2014-07-15 ENCOUNTER — Encounter: Payer: Self-pay | Admitting: Internal Medicine

## 2014-08-04 DIAGNOSIS — M961 Postlaminectomy syndrome, not elsewhere classified: Secondary | ICD-10-CM | POA: Diagnosis not present

## 2014-08-04 DIAGNOSIS — E669 Obesity, unspecified: Secondary | ICD-10-CM | POA: Diagnosis not present

## 2014-08-18 ENCOUNTER — Encounter: Payer: Self-pay | Admitting: Internal Medicine

## 2014-08-18 ENCOUNTER — Ambulatory Visit (INDEPENDENT_AMBULATORY_CARE_PROVIDER_SITE_OTHER): Payer: Medicare Other | Admitting: Internal Medicine

## 2014-08-18 VITALS — BP 106/68 | HR 48 | Temp 97.5°F | Resp 16 | Ht 74.5 in | Wt 279.8 lb

## 2014-08-18 DIAGNOSIS — E782 Mixed hyperlipidemia: Secondary | ICD-10-CM

## 2014-08-18 DIAGNOSIS — Z23 Encounter for immunization: Secondary | ICD-10-CM

## 2014-08-18 DIAGNOSIS — E291 Testicular hypofunction: Secondary | ICD-10-CM | POA: Diagnosis not present

## 2014-08-18 DIAGNOSIS — Z9989 Dependence on other enabling machines and devices: Secondary | ICD-10-CM

## 2014-08-18 DIAGNOSIS — Z1331 Encounter for screening for depression: Secondary | ICD-10-CM

## 2014-08-18 DIAGNOSIS — F329 Major depressive disorder, single episode, unspecified: Secondary | ICD-10-CM

## 2014-08-18 DIAGNOSIS — R7309 Other abnormal glucose: Secondary | ICD-10-CM | POA: Diagnosis not present

## 2014-08-18 DIAGNOSIS — G894 Chronic pain syndrome: Secondary | ICD-10-CM

## 2014-08-18 DIAGNOSIS — E349 Endocrine disorder, unspecified: Secondary | ICD-10-CM

## 2014-08-18 DIAGNOSIS — I10 Essential (primary) hypertension: Secondary | ICD-10-CM

## 2014-08-18 DIAGNOSIS — N182 Chronic kidney disease, stage 2 (mild): Secondary | ICD-10-CM

## 2014-08-18 DIAGNOSIS — Z79899 Other long term (current) drug therapy: Secondary | ICD-10-CM | POA: Diagnosis not present

## 2014-08-18 DIAGNOSIS — E559 Vitamin D deficiency, unspecified: Secondary | ICD-10-CM

## 2014-08-18 DIAGNOSIS — K2100 Gastro-esophageal reflux disease with esophagitis, without bleeding: Secondary | ICD-10-CM

## 2014-08-18 DIAGNOSIS — M5136 Other intervertebral disc degeneration, lumbar region: Secondary | ICD-10-CM

## 2014-08-18 DIAGNOSIS — Z125 Encounter for screening for malignant neoplasm of prostate: Secondary | ICD-10-CM

## 2014-08-18 DIAGNOSIS — M51369 Other intervertebral disc degeneration, lumbar region without mention of lumbar back pain or lower extremity pain: Secondary | ICD-10-CM

## 2014-08-18 DIAGNOSIS — R7303 Prediabetes: Secondary | ICD-10-CM

## 2014-08-18 DIAGNOSIS — Z1212 Encounter for screening for malignant neoplasm of rectum: Secondary | ICD-10-CM

## 2014-08-18 DIAGNOSIS — F32A Depression, unspecified: Secondary | ICD-10-CM

## 2014-08-18 DIAGNOSIS — Z9181 History of falling: Secondary | ICD-10-CM

## 2014-08-18 DIAGNOSIS — G4733 Obstructive sleep apnea (adult) (pediatric): Secondary | ICD-10-CM

## 2014-08-18 MED ORDER — BUPROPION HCL ER (XL) 300 MG PO TB24: ORAL_TABLET | ORAL | Status: DC

## 2014-08-18 NOTE — Progress Notes (Signed)
Patient ID: Jared [name], male   DOB: 11-Aug-1955, 59 y.o.   MRN: [id number] Annual Comprehensive Examination  This very nice 59 y.o.male presents for complete physical.  Patient has been followed for HTN, Morbid Obesity, Prediabetes, Hyperlipidemia, and Vitamin D Deficiency. Patient is followed in Pain Mgmt for Chronic Lumbago due to DDD. Patient also reports being somewhat depressed which he attributes to multiple stressors as his chronic LBP & Lt Sciatica, as well as various family circumstances.   HTN predates since 1996. Patient's BP has been controlled at home.Today's BP: 106/68 mmHg. In 2008 & 2012 he had Negative Cardiolite tests. Patient denies any cardiac symptoms as chest pain, palpitations, shortness of breath, dizziness or ankle swelling.   Patient's hyperlipidemia is partially controlled with diet and medications. Patient denies myalgias or other medication SE's. Last lipids were at goal - Total Chol 117; HDL 26; LDL 39; with elevated Triglycerides 259 on 02/04/2014.   Patient has Morbid Obesity (BMI 35+) and consequent prediabetes since since 2012 with A1c 5.9% and elevated Insulin 41 consistent with  insulin resistance and patient denies reactive hypoglycemic symptoms, visual blurring, diabetic polys or paresthesias. Last A1c was  5.6% on  02/04/2014.   Patient has Low T & has been off Tx for ~ 6 weeks, but he has been taking a testosterone booster that he gets from a nutrition store. Finally, patient has history of Vitamin D Deficiency of 32 in 2009 and last vitamin D was  87 on  02/04/2014.  Medication Sig  . atorvastatin 40 MG tablet TAKE 1 TABLET DAILY OR AS DIRECTED FOR CHOLESTEROL  . azelastine (ASTELIN) 0.1 % nasal spray 1 to 2 sprays each nostril every 12 hours  . bisoprolol-hctz (ZIAC) 10-6.25 TAKE 1 TABLET IN THE MORNING FOR BLOOD PRESSURE  . CVITAMIN D 5000 UNITS TABS Take 1 tablet by mouth 3 (three) times daily.  . citalopram (CELEXA) 40 MG tablet TAKE 1 TABLET EVERY DAY FOR  MOOD  . FLAXSEED OIL  Take 1 tablet by mouth daily.    Marland Kitchen losartan-hctz (HYZAAR) 100-25  TAKE 1 TABLET EVERY DAY  . minoxidil (LONITEN) 10 MG tablet Take 1 tablet daily or as directed for BP  . montelukast  10 MG tablet TAKE 1 TABLET EVERY DAY  . FISH OIL Take 1 tablet by mouth daily.    . ondansetron (ZOFRAN) 8 MG tablet 1/2 to 1 tablet up to 3 x daily as needed for nausea.  Marland Kitchen oxycodone 30 MG immediate release  Take 30 mg by mouth every 4 (four) hours as needed for pain. Max dose of 240 mg in 24 hours  . DEPO-TESTOTERONE  200 MG/ML  Inject 2 cc IM every 2 weeks or as directed by a physician.  --->> OFF x 6 weeks   Allergies  Allergen Reactions  . Morphine And Related   . Neurontin [Gabapentin]    Past Medical History  Diagnosis Date  . Chest pain   . HTN (hypertension)   . Hyperlipemia   . DJD (degenerative joint disease)   . BPH (benign prostatic hypertrophy)   . Obesity   . OSA (obstructive sleep apnea)   . Hypercholesteremia    Health Maintenance  Topic Date Due  . HIV Screening  06/22/1968  . COLONOSCOPY  06/23/2003  . ZOSTAVAX  06/22/2013  . INFLUENZA VACCINE  12/21/2013  . TETANUS/TDAP  03/25/2015   Immunization History  Administered Date(s) Administered  . DT 08/18/2014  . Influenza-Unspecified 05/12/2011  . Pneumococcal-Unspecified 05/23/2006  .  Td 03/24/2005   Past Surgical History  Procedure Laterality Date  . Back surgery    . Prostatectomy     History   Social History  . Marital Status: Married    Spouse Name: N/A  . Number of Children: N/A  . Years of Education: N/A   Occupational History  . 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   Social History Main Topics  . Smoking status: Former Games developer  . Smokeless tobacco: Not on file  . Alcohol Use: No     Comment: rarely  . Drug Use: No  . Sexual Activity: Not on file    ROS Constitutional: Denies fever, chills, weight loss/gain, headaches, insomnia, fatigue, night sweats or change in  appetite. Eyes: Denies redness, blurred vision, diplopia, discharge, itchy or watery eyes.  ENT: Denies discharge, congestion, post nasal drip, epistaxis, sore throat, earache, hearing loss, dental pain, Tinnitus, Vertigo, Sinus pain or snoring.  Cardio: Denies chest pain, palpitations, irregular heartbeat, syncope, dyspnea, diaphoresis, orthopnea, PND, claudication or edema Respiratory: denies cough, dyspnea, DOE, pleurisy, hoarseness, laryngitis or wheezing.  Gastrointestinal: Denies dysphagia, heartburn, reflux, water brash, pain, cramps, nausea, vomiting, bloating, diarrhea, constipation, hematemesis, melena, hematochezia, jaundice or hemorrhoids Genitourinary: Denies dysuria, frequency, urgency, nocturia, hesitancy, discharge, hematuria or flank pain Musculoskeletal: Denies arthralgia, myalgia, stiffness, Jt. Swelling, pain, limp or strain/sprain. Denies Falls. Skin: Denies puritis, rash, hives, warts, acne, eczema or change in skin lesion Neuro: No weakness, tremor, incoordination, spasms, paresthesia or pain Psychiatric: Denies confusion, memory loss or sensory loss. Denies Depression. Endocrine: Denies change in weight, skin, hair change, nocturia, and paresthesia, diabetic polys, visual blurring or hyper / hypo glycemic episodes.  Heme/Lymph: No excessive bleeding, bruising or enlarged lymph nodes.  Physical Exam  BP 106/68   Pulse 48  Temp 97.5 F   Resp 16  Ht 6' 2.5"   Wt 279 lb 12.8 oz     BMI 35.45   General Appearance: Over nourished, in no apparent distress. Eyes: PERRLA, EOMs, conjunctiva no swelling or erythema, normal fundi and vessels. Sinuses: No frontal/maxillary tenderness ENT/Mouth: EACs patent / TMs  nl. Nares clear without erythema, swelling, mucoid exudates. Oral hygiene is good. No erythema, swelling, or exudate. Tongue normal, non-obstructing. Tonsils not swollen or erythematous. Hearing normal.  Neck: Supple, thyroid normal. No bruits, nodes or  JVD. Respiratory: Respiratory effort normal.  BS equal and clear bilateral without rales, rhonci, wheezing or stridor. Cardio: Heart sounds are normal with regular rate and rhythm and no murmurs, rubs or gallops. Peripheral pulses are normal and equal bilaterally without edema. No aortic or femoral bruits. Chest: symmetric with normal excursions and percussion.  Abdomen: Rotund, soft, with bowl sounds. Nontender, no guarding, rebound, hernias, masses, or organomegaly.  Lymphatics: Non tender without lymphadenopathy.  Genitourinary: No hernias.Testes nl. DRE - prostate nl for age - smooth & firm w/o nodules. Musculoskeletal: Full ROM all peripheral extremities, joint stability, 5/5 strength, and normal gait. Skin: Warm and dry without rashes, lesions, cyanosis, clubbing or  ecchymosis.  Neuro: Cranial nerves intact, reflexes equal bilaterally. Normal muscle tone, no cerebellar symptoms. Sensation intact.  Pysch: Awake and oriented X 3 with normal affect, insight and judgment appropriate.   Assessment and Plan  1. Essential hypertension  - Microalbumin / creatinine urine ratio - EKG 12-Lead - [place], RETROPERITNL ABD,  LTD - TSH  2. Mixed hyperlipidemia  - Lipid panel  3. Vitamin D deficiency  - Vit D  25 hydroxy (rtn osteoporosis monitoring)  4. Prediabetes  -  Hemoglobin A1c - Insulin, random  5. Testosterone deficiency  - Testosterone  6. 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 Reflux esophagitis   8. OSA on CPAP   9. DDD (degenerative disc disease), lumbar   10. Chronic pain syndrome   11. Screening for rectal cancer  - POC Hemoccult Bld/Stl (3-Cd Home Screen); Future  12. Prostate cancer screening  - PSA  13. Depression screen   14. At low risk for fall   15. Medication management  - Urine Microscopic - CBC with Differential/Platelet - BASIC METABOLIC PANEL WITH GFR - Hepatic function panel - Magnesium  16. Depression  - buPROPion (WELLBUTRIN XL)  300 MG 24 hr tablet; Take 1 tablet each morning for mood  Dispense: 30 tablet; Refill: 5  17. Need for prophylactic vaccination with tetanus-diphtheria (TD)  - DT Vaccine greater than 7yo IM   Continue prudent diet as discussed, weight control, BP monitoring, regular exercise, and medications as discussed.  Discussed med effects and SE's. Routine screening labs and tests as requested with regular follow-up as recommended. Over 40 minutes of exam, counseling &  chart review was performed

## 2014-08-18 NOTE — Patient Instructions (Signed)

## 2014-08-19 LAB — CBC WITH DIFFERENTIAL/PLATELET
BASOS ABS: 0.1 10*3/uL (ref 0.0–0.1)
BASOS PCT: 1 % (ref 0–1)
Eosinophils Absolute: 0.2 10*3/uL (ref 0.0–0.7)
Eosinophils Relative: 3 % (ref 0–5)
HEMATOCRIT: 40.7 % (ref 39.0–52.0)
HEMOGLOBIN: 14.1 g/dL (ref 13.0–17.0)
LYMPHS ABS: 1.2 10*3/uL (ref 0.7–4.0)
Lymphocytes Relative: 18 % (ref 12–46)
MCH: 30.7 pg (ref 26.0–34.0)
MCHC: 34.6 g/dL (ref 30.0–36.0)
MCV: 88.7 fL (ref 78.0–100.0)
MONO ABS: 0.4 10*3/uL (ref 0.1–1.0)
MPV: 11.1 fL (ref 8.6–12.4)
Monocytes Relative: 7 % (ref 3–12)
Neutro Abs: 4.5 10*3/uL (ref 1.7–7.7)
Neutrophils Relative %: 71 % (ref 43–77)
Platelets: 235 10*3/uL (ref 150–400)
RBC: 4.59 MIL/uL (ref 4.22–5.81)
RDW: 13.2 % (ref 11.5–15.5)
WBC: 6.4 10*3/uL (ref 4.0–10.5)

## 2014-08-19 LAB — LIPID PANEL
Cholesterol: 190 mg/dL (ref 0–200)
HDL: 30 mg/dL — AB (ref >=40)
LDL Cholesterol: 98 mg/dL (ref 0–99)
TRIGLYCERIDES: 308 mg/dL — AB (ref <150)
Total CHOL/HDL Ratio: 6.3 Ratio
VLDL: 62 mg/dL — AB (ref 0–40)

## 2014-08-19 LAB — VITAMIN D 25 HYDROXY (VIT D DEFICIENCY, FRACTURES): Vit D, 25-Hydroxy: 57 ng/mL (ref 30–100)

## 2014-08-19 LAB — INSULIN, RANDOM: Insulin: 15.6 u[IU]/mL (ref 2.0–19.6)

## 2014-08-19 LAB — PSA: PSA: 0.95 ng/mL (ref <=4.00)

## 2014-08-19 LAB — BASIC METABOLIC PANEL WITH GFR
BUN: 10 mg/dL (ref 6–23)
CALCIUM: 9.3 mg/dL (ref 8.4–10.5)
CHLORIDE: 95 meq/L — AB (ref 96–112)
CO2: 32 mEq/L (ref 19–32)
Creat: 1.12 mg/dL (ref 0.50–1.35)
GFR, EST NON AFRICAN AMERICAN: 71 mL/min
GFR, Est African American: 82 mL/min
Glucose, Bld: 85 mg/dL (ref 70–99)
Potassium: 3.6 mEq/L (ref 3.5–5.3)
SODIUM: 136 meq/L (ref 135–145)

## 2014-08-19 LAB — HEPATIC FUNCTION PANEL
ALT: 17 U/L (ref 0–53)
AST: 18 U/L (ref 0–37)
Albumin: 4.5 g/dL (ref 3.5–5.2)
Alkaline Phosphatase: 58 U/L (ref 39–117)
BILIRUBIN INDIRECT: 0.5 mg/dL (ref 0.2–1.2)
Bilirubin, Direct: 0.1 mg/dL (ref 0.0–0.3)
TOTAL PROTEIN: 6.9 g/dL (ref 6.0–8.3)
Total Bilirubin: 0.6 mg/dL (ref 0.2–1.2)

## 2014-08-19 LAB — HEMOGLOBIN A1C
HEMOGLOBIN A1C: 5.6 % (ref <5.7)
MEAN PLASMA GLUCOSE: 114 mg/dL (ref <117)

## 2014-08-19 LAB — URINALYSIS, MICROSCOPIC ONLY
Bacteria, UA: NONE SEEN
CRYSTALS: NONE SEEN
Casts: NONE SEEN
Squamous Epithelial / LPF: NONE SEEN

## 2014-08-19 LAB — TSH: TSH: 2.24 u[IU]/mL (ref 0.350–4.500)

## 2014-08-19 LAB — MICROALBUMIN / CREATININE URINE RATIO
CREATININE, URINE: 44.9 mg/dL
MICROALB UR: 0.2 mg/dL (ref <2.0)
Microalb Creat Ratio: 4.5 mg/g (ref 0.0–30.0)

## 2014-08-19 LAB — MAGNESIUM: Magnesium: 1.7 mg/dL (ref 1.5–2.5)

## 2014-08-19 LAB — TESTOSTERONE: TESTOSTERONE: 26 ng/dL — AB (ref 300–890)

## 2014-08-25 ENCOUNTER — Other Ambulatory Visit: Payer: Self-pay | Admitting: *Deleted

## 2014-08-25 ENCOUNTER — Other Ambulatory Visit: Payer: Self-pay | Admitting: Physician Assistant

## 2014-08-25 MED ORDER — TESTOSTERONE CYPIONATE 200 MG/ML IM SOLN: INTRAMUSCULAR | Status: DC

## 2014-09-11 ENCOUNTER — Other Ambulatory Visit: Payer: Self-pay | Admitting: Internal Medicine

## 2014-09-15 ENCOUNTER — Other Ambulatory Visit: Payer: Self-pay | Admitting: *Deleted

## 2014-09-15 DIAGNOSIS — F32A Depression, unspecified: Secondary | ICD-10-CM

## 2014-09-15 DIAGNOSIS — F329 Major depressive disorder, single episode, unspecified: Secondary | ICD-10-CM

## 2014-09-15 MED ORDER — BUPROPION HCL ER (XL) 300 MG PO TB24: ORAL_TABLET | ORAL | Status: DC

## 2014-09-22 ENCOUNTER — Other Ambulatory Visit: Payer: Self-pay | Admitting: Internal Medicine

## 2014-09-22 DIAGNOSIS — IMO0001 Reserved for inherently not codable concepts without codable children: Secondary | ICD-10-CM

## 2014-09-26 ENCOUNTER — Ambulatory Visit: Payer: Self-pay | Admitting: Internal Medicine

## 2014-09-30 ENCOUNTER — Ambulatory Visit: Payer: Self-pay | Admitting: Internal Medicine

## 2014-10-02 DIAGNOSIS — E669 Obesity, unspecified: Secondary | ICD-10-CM | POA: Diagnosis not present

## 2014-10-02 DIAGNOSIS — M961 Postlaminectomy syndrome, not elsewhere classified: Secondary | ICD-10-CM | POA: Diagnosis not present

## 2014-10-07 ENCOUNTER — Emergency Department (HOSPITAL_COMMUNITY)
Admission: EM | Admit: 2014-10-07 | Discharge: 2014-10-07 | Disposition: A | Discharge status: Home / Self Care | Payer: Medicare Other | Attending: Emergency Medicine | Admitting: Emergency Medicine

## 2014-10-07 ENCOUNTER — Encounter (HOSPITAL_COMMUNITY): Payer: Self-pay

## 2014-10-07 DIAGNOSIS — E785 Hyperlipidemia, unspecified: Secondary | ICD-10-CM | POA: Insufficient documentation

## 2014-10-07 DIAGNOSIS — Z8669 Personal history of other diseases of the nervous system and sense organs: Secondary | ICD-10-CM | POA: Insufficient documentation

## 2014-10-07 DIAGNOSIS — Z79899 Other long term (current) drug therapy: Secondary | ICD-10-CM | POA: Diagnosis not present

## 2014-10-07 DIAGNOSIS — M545 Low back pain, unspecified: Secondary | ICD-10-CM

## 2014-10-07 DIAGNOSIS — I10 Essential (primary) hypertension: Secondary | ICD-10-CM | POA: Diagnosis not present

## 2014-10-07 DIAGNOSIS — Z8739 Personal history of other diseases of the musculoskeletal system and connective tissue: Secondary | ICD-10-CM | POA: Insufficient documentation

## 2014-10-07 DIAGNOSIS — Z87448 Personal history of other diseases of urinary system: Secondary | ICD-10-CM | POA: Diagnosis not present

## 2014-10-07 DIAGNOSIS — E669 Obesity, unspecified: Secondary | ICD-10-CM | POA: Diagnosis not present

## 2014-10-07 DIAGNOSIS — Z87891 Personal history of nicotine dependence: Secondary | ICD-10-CM | POA: Insufficient documentation

## 2014-10-07 LAB — URINALYSIS, ROUTINE W REFLEX MICROSCOPIC
BILIRUBIN URINE: NEGATIVE
Glucose, UA: NEGATIVE mg/dL
Hgb urine dipstick: NEGATIVE
Ketones, ur: NEGATIVE mg/dL
Leukocytes, UA: NEGATIVE
NITRITE: NEGATIVE
Protein, ur: NEGATIVE mg/dL
Specific Gravity, Urine: 1.004 — ABNORMAL LOW (ref 1.005–1.030)
UROBILINOGEN UA: 0.2 mg/dL (ref 0.0–1.0)
pH: 7 (ref 5.0–8.0)

## 2014-10-07 MED ORDER — HYDROMORPHONE HCL 1 MG/ML IJ SOLN
1.0000 mg | Freq: Once | INTRAMUSCULAR | Status: AC
  Administered 2014-10-07: 1 mg via INTRAVENOUS
  Filled 2014-10-07: qty 1

## 2014-10-07 MED ORDER — SODIUM CHLORIDE 0.9 % IV BOLUS (SEPSIS)
500.0000 mL | Freq: Once | INTRAVENOUS | Status: AC
  Administered 2014-10-07: 500 mL via INTRAVENOUS

## 2014-10-07 MED ORDER — ONDANSETRON HCL 4 MG/2ML IJ SOLN
4.0000 mg | Freq: Once | INTRAMUSCULAR | Status: AC
  Administered 2014-10-07: 4 mg via INTRAVENOUS
  Filled 2014-10-07: qty 2

## 2014-10-07 MED ORDER — KETOROLAC TROMETHAMINE 30 MG/ML IJ SOLN
30.0000 mg | Freq: Once | INTRAMUSCULAR | Status: AC
  Administered 2014-10-07: 30 mg via INTRAVENOUS
  Filled 2014-10-07: qty 1

## 2014-10-07 MED ORDER — DEXAMETHASONE SODIUM PHOSPHATE 10 MG/ML IJ SOLN
10.0000 mg | Freq: Once | INTRAMUSCULAR | Status: AC
  Administered 2014-10-07: 10 mg via INTRAVENOUS
  Filled 2014-10-07: qty 1

## 2014-10-07 MED ORDER — DIAZEPAM 5 MG PO TABS
5.0000 mg | ORAL_TABLET | Freq: Once | ORAL | Status: AC
  Administered 2014-10-07: 5 mg via ORAL
  Filled 2014-10-07: qty 1

## 2014-10-07 NOTE — ED Notes (Signed)
Dilaudid diluted in 0.9 NaCl flush and pushed slowly over 10 minutes.  Patient c/o nausea, MD aware and to write order for IV antiemetic. Side rails up times two and spouse at bedside.

## 2014-10-07 NOTE — ED Provider Notes (Signed)
CSN: [id number]     Arrival date & time 10/07/14  0753 History   First MD Initiated Contact with Patient 10/07/14 0802     Chief Complaint  Patient presents with  . Back Pain     HPI   Patient was concern of increasing severity about his typically painful area in the lower back. Patient is a long history of pain in this area, sees orthopedics, pain management. Patient has not seen either of these practices over the past week as he has developed increasingly severe pain in the lower back. He denies new abdominal pain, incontinence, fever, chills, weakness in either lower extremity, falling. He is taking all medication, including substantial narcotics as directed. Today, with persistently worsening pain, he presents for evaluation.  Past Medical History  Diagnosis Date  . Chest pain   . HTN (hypertension)   . Hyperlipemia   . DJD (degenerative joint disease)   . BPH (benign prostatic hypertrophy)   . Obesity   . OSA (obstructive sleep apnea)   . Hypercholesteremia    Past Surgical History  Procedure Laterality Date  . Back surgery    . Prostatectomy     History reviewed. No pertinent family history. History  Substance Use Topics  . Smoking status: Former Games developer  . Smokeless tobacco: Not on file  . Alcohol Use: No     Comment: rarely    Review of Systems  Constitutional:       Per HPI, otherwise negative  HENT:       Per HPI, otherwise negative  Respiratory:       Per HPI, otherwise negative  Cardiovascular:       Per HPI, otherwise negative  Gastrointestinal: Negative for vomiting.  Endocrine:       Negative aside from HPI  Genitourinary:       Neg aside from HPI   Musculoskeletal:       Per HPI, otherwise negative  Skin: Negative.   Neurological: Negative for syncope.      Allergies  Morphine and related and Neurontin  Home Medications   Prior to Admission medications   Medication Sig Start Date End Date Taking? Authorizing Provider  ALPRAZolam  Prudy Feeler) 1 MG tablet TAKE 1/2 TO 1 TABLET 3 TIMES A DAY AS NEEDED 09/22/14   Lucky Cowboy, MD  atorvastatin (LIPITOR) 40 MG tablet TAKE 1 TABLET DAILY OR AS DIRECTED FOR CHOLESTEROL 01/12/14   Lucky Cowboy, MD  azelastine (ASTELIN) 0.1 % nasal spray 1 to 2 sprays each nostril every 12 hours 10/07/13 10/08/14  Lucky Cowboy, MD  bisoprolol-hydrochlorothiazide [facility]) 10-6.25 MG per tablet TAKE 1 TABLET IN THE MORNING FOR BLOOD PRESSURE 04/01/14   Lucky Cowboy, MD  buPROPion (WELLBUTRIN XL) 300 MG 24 hr tablet Take 1 tablet each morning for mood 09/15/14 04/17/15  Lucky Cowboy, MD  Cholecalciferol (VITAMIN D-3) 5000 UNITS TABS Take 1 tablet by mouth 3 (three) times daily.    Historical Provider, MD  citalopram (CELEXA) 40 MG tablet TAKE 1 TABLET BY MOUTH EVERY DAY FOR MOOD 09/11/14   Lucky Cowboy, MD  Flaxseed, Linseed, (FLAXSEED OIL PO) Take 1 tablet by mouth daily.      Historical Provider, MD  fluticasone Aleda Grana) 50 MCG/ACT nasal spray  06/09/14   Historical Provider, MD  losartan-hydrochlorothiazide (HYZAAR) 100-25 MG per tablet TAKE 1 TABLET BY MOUTH EVERY DAY 09/11/14   Lucky Cowboy, MD  minoxidil (LONITEN) 10 MG tablet Take 1 tablet daily or as directed for BP 03/23/14  Lucky Cowboy, MD  montelukast (SINGULAIR) 10 MG tablet TAKE 1 TABLET EVERY DAY 06/16/14   Lucky Cowboy, MD  Omega-3 Fatty Acids (FISH OIL BURP-LESS PO) Take 1 tablet by mouth daily.      Historical Provider, MD  ondansetron (ZOFRAN) 8 MG tablet 1/2 to 1 tablet up to 3 x daily as needed for nausea. 07/15/13   Lucky Cowboy, MD  oxycodone (ROXICODONE) 30 MG immediate release tablet Take 60 mg by mouth. 09/08/14 10/08/14  Historical Provider, MD  testosterone cypionate (DEPOTESTOTERONE CYPIONATE) 200 MG/ML injection Inject 2 cc IM every 2 weeks or as directed by a physician. 08/25/14   Lucky Cowboy, MD   BP 139/77 mmHg  Pulse 50  Temp(Src) 97.4 F (36.3 C) (Oral)  Resp 18  SpO2 97% Physical Exam    Constitutional: He is oriented to person, place, and time. He appears well-developed. No distress.  HENT:  Head: Normocephalic and atraumatic.  Eyes: Conjunctivae and EOM are normal.  Cardiovascular: Normal rate and regular rhythm.   Pulmonary/Chest: Effort normal. No stridor. No respiratory distress.  Abdominal: He exhibits no distension.  Musculoskeletal: He exhibits no edema.       Arms: Neurological: He is alert and oriented to person, place, and time.  5/5 flexion hips bilaterally, extension and flexion of knees bilaterally, no loss of sensation appreciable.  Skin: Skin is warm and dry.  Psychiatric: He has a normal mood and affect.  Nursing note and vitals reviewed.   ED Course  Procedures (including critical care time) Labs Review Labs Reviewed  URINALYSIS, ROUTINE W REFLEX MICROSCOPIC - Abnormal; Notable for the following:    Specific Gravity, Urine 1.004 (*)    All other components within normal limits       Specific Gravity, Urine 1.004 (*)    All other components within normal limits    Imaging Review  MRI 02/06/2014 L1-2: No impingement. Mild disc bulge with small central disc protrusion.   L2-3: Borderline central narrowing of the thecal sac due to disc bulge and congenitally short pedicles. No direct impingement.   L3-4: Mild central narrowing of the thecal sac and mild bilateral subarticular lateral recess stenosis with borderline left foraminal stenosis secondary to disc bulge, congenitally short pedicles, and mild facet arthropathy.   L4-5: Mild left foraminal stenosis due to facet arthropathy. Borderline bilateral subarticular lateral recess stenosis due to spurring of the facet joints. Mild epidural fibrosis in the lateral recesses and along the margins of the thecal sac.   L5-S1: No impingement. Posterior decompression. Marginalized nerve roots, query arachnoiditis.   IMPRESSION: 1. Lumbar spondylosis and degenerative disc disease with  mild impingement at L3-4 and L4-5, as noted above. Borderline central narrowing of the thecal sac at L2-3. 2. At the L5 level and below, the cauda equina appear somewhat marginalized, suspicious for low-grade arachnoiditis. 3. Congenitally short pedicles in the lumbar spine.   10:56 AM Following 2 boluses of Dilaudid, Toradol, fluids, the patient has substantial improvement. We discussed additional therapy with Decadron, one more dose of Dilaudid, and if he remains well he will follow-up with his orthopedist.  MDM  Patient presents with an acute exacerbation of low back pain. Patient is distally neurovascularly intact, has no evidence for cord compression or occult infection. Patient improved substantially here with multiple medication. Patient is orthopedist and pain management specialist with whom he will follow-up.     Gerhard Munch, MD 10/07/14 1057

## 2014-10-07 NOTE — Discharge Instructions (Signed)
As discussed, your evaluation today has been largely reassuring.  But, it is important that you monitor your condition carefully, and do not hesitate to return to the ED if you develop new, or concerning changes in your condition. ? ?Otherwise, please follow-up with your physician for appropriate ongoing care. ? ?

## 2014-10-07 NOTE — ED Notes (Signed)
Pt with chronic back pain.  Goes to pain clinic.  Pt had surgery in November for correction.  Problem continues and may need surgery d/t bone graph "cracking" from November.

## 2014-10-07 NOTE — ED Notes (Signed)
Pt c/o pain to lower back, difficulty with walking and sitting.  Patient in gown, MD in room.

## 2014-10-08 ENCOUNTER — Other Ambulatory Visit: Payer: Self-pay | Admitting: Internal Medicine

## 2014-10-21 ENCOUNTER — Other Ambulatory Visit: Payer: Self-pay | Admitting: Internal Medicine

## 2014-12-01 DIAGNOSIS — M961 Postlaminectomy syndrome, not elsewhere classified: Secondary | ICD-10-CM | POA: Diagnosis not present

## 2014-12-01 DIAGNOSIS — E669 Obesity, unspecified: Secondary | ICD-10-CM | POA: Diagnosis not present

## 2014-12-01 DIAGNOSIS — G4700 Insomnia, unspecified: Secondary | ICD-10-CM | POA: Diagnosis not present

## 2014-12-17 ENCOUNTER — Ambulatory Visit (INDEPENDENT_AMBULATORY_CARE_PROVIDER_SITE_OTHER): Payer: Medicare Other | Admitting: Internal Medicine

## 2014-12-17 VITALS — BP 132/74 | Wt 286.0 lb

## 2014-12-17 DIAGNOSIS — E782 Mixed hyperlipidemia: Secondary | ICD-10-CM | POA: Diagnosis not present

## 2014-12-17 DIAGNOSIS — Z79899 Other long term (current) drug therapy: Secondary | ICD-10-CM | POA: Diagnosis not present

## 2014-12-17 DIAGNOSIS — I10 Essential (primary) hypertension: Secondary | ICD-10-CM

## 2014-12-17 DIAGNOSIS — R7309 Other abnormal glucose: Secondary | ICD-10-CM | POA: Diagnosis not present

## 2014-12-17 DIAGNOSIS — N182 Chronic kidney disease, stage 2 (mild): Secondary | ICD-10-CM

## 2014-12-17 DIAGNOSIS — E559 Vitamin D deficiency, unspecified: Secondary | ICD-10-CM

## 2014-12-17 DIAGNOSIS — R7303 Prediabetes: Secondary | ICD-10-CM

## 2014-12-17 LAB — CBC WITH DIFFERENTIAL/PLATELET
Basophils Absolute: 0.1 10*3/uL (ref 0.0–0.1)
Basophils Relative: 1 % (ref 0–1)
EOS ABS: 0.1 10*3/uL (ref 0.0–0.7)
Eosinophils Relative: 2 % (ref 0–5)
HCT: 50.2 % (ref 39.0–52.0)
HEMOGLOBIN: 17.1 g/dL — AB (ref 13.0–17.0)
Lymphocytes Relative: 15 % (ref 12–46)
Lymphs Abs: 0.9 10*3/uL (ref 0.7–4.0)
MCH: 30.7 pg (ref 26.0–34.0)
MCHC: 34.1 g/dL (ref 30.0–36.0)
MCV: 90.1 fL (ref 78.0–100.0)
MONO ABS: 0.6 10*3/uL (ref 0.1–1.0)
MONOS PCT: 10 % (ref 3–12)
MPV: 10.7 fL (ref 8.6–12.4)
NEUTROS PCT: 72 % (ref 43–77)
Neutro Abs: 4.5 10*3/uL (ref 1.7–7.7)
PLATELETS: 229 10*3/uL (ref 150–400)
RBC: 5.57 MIL/uL (ref 4.22–5.81)
RDW: 13.6 % (ref 11.5–15.5)
WBC: 6.2 10*3/uL (ref 4.0–10.5)

## 2014-12-17 MED ORDER — ALBUTEROL SULFATE HFA 108 (90 BASE) MCG/ACT IN AERS: 2.0000 | INHALATION_SPRAY | RESPIRATORY_TRACT | Status: DC | PRN

## 2014-12-17 NOTE — Progress Notes (Signed)
Patient ID: Jared [name], male   DOB: [birth year], 59 y.o.   MRN: [id number]  Assessment and Plan:  Hypertension:  -Continue medication -monitor blood pressure at home. -Continue DASH diet -Reminder to go to the ER if any CP, SOB, nausea, dizziness, severe HA, changes vision/speech, left arm numbness and tingling and jaw pain.  Cholesterol - Continue diet and exercise -Check cholesterol.   Diabetes with diabetic chronic kidney disease -Continue diet and exercise.  -Check A1C  Vitamin D Def -check level -continue medications.   Wheezing -possible COPD changes vs. Allergies? -patient doesn't want any more singulair as he does not feel it is helping. -Dulera samples  -albuterol  Continue diet and meds as discussed. Further disposition pending results of labs. Discussed med's effects and SE's.    HPI 59 y.o. male  presents for 3 month follow up with hypertension, hyperlipidemia, diabetes and vitamin D deficiency.   His blood pressure has been controlled at home, today their BP is BP: 132/74 mmHg.He does not workout. He denies chest pain, shortness of breath, dizziness.  He occasionally swims.     He is on cholesterol medication and denies myalgias. His cholesterol is at goal. The cholesterol was:  08/18/2014: Cholesterol 190; HDL 30*; LDL Cholesterol 98; Triglycerides 308*   He has not been working on diet and exercise for diabetes with diabetic chronic kidney disease, he is not on bASA, he is on ACE/ARB, and denies  foot ulcerations, hyperglycemia, hypoglycemia , increased appetite, nausea, paresthesia of the feet, polydipsia, polyuria, visual disturbances, vomiting and weight loss. Last A1C was: 08/18/2014: Hgb A1c MFr Bld 5.6   Patient is on Vitamin D supplement. 08/18/2014: Vit D, 25-Hydroxy 57  Patient reports that he has been having some issues with wheezing especially after he is outside for some time. He has been using his wife's proair once or twice a month and he finds that  that gives him a lot of relief.    Patient reports that he has not been using his singulair regularly.     Current Medications:  Current Outpatient Prescriptions on File Prior to Visit  Medication Sig Dispense Refill  . ALPRAZolam (XANAX) 1 MG tablet TAKE 1/2 TO 1 TABLET 3 TIMES A DAY AS NEEDED (Patient taking differently: TAKE 1/2 TO 1 TABLET 3 TIMES A DAY AS NEEDED for anxiety) 90 tablet 2  . atorvastatin (LIPITOR) 40 MG tablet TAKE 1 TABLET BY MOUTH DAILY OR AS DIRECTED FOR CHOLESTEROL 90 tablet 3  . azelastine (ASTELIN) 0.1 % nasal spray 1 to 2 sprays each nostril every 12 hours (Patient taking differently: Place 1-2 sprays into both nostrils 2 (two) times daily as needed for allergies. 1 to 2 sprays each nostril every 12 hours) 30 mL 99  . bisoprolol-hydrochlorothiazide (ZIAC) 10-6.25 MG per tablet TAKE 1 TABLET IN THE MORNING FOR BLOOD PRESSURE 90 tablet 3  . buPROPion (WELLBUTRIN XL) 300 MG 24 hr tablet Take 1 tablet each morning for mood 90 tablet 1  . Cholecalciferol (VITAMIN D-3) 5000 UNITS TABS Take 3 tablets by mouth daily.     . citalopram (CELEXA) 40 MG tablet TAKE 1 TABLET BY MOUTH EVERY DAY FOR MOOD 90 tablet 0  . docusate sodium (COLACE) 100 MG capsule Take 200 mg by mouth at bedtime.    . Flaxseed, Linseed, (FLAXSEED OIL PO) Take 1 tablet by mouth daily.      . fluticasone (FLONASE) 50 MCG/ACT nasal spray USE 1 TO 2 SPRAYS IN EACHNOSTRIL ONCE DAILY 16  g 99  . losartan-hydrochlorothiazide (HYZAAR) 100-25 MG per tablet TAKE 1 TABLET BY MOUTH EVERY DAY 90 tablet 0  . minoxidil (LONITEN) 10 MG tablet Take 1 tablet daily or as directed for BP (Patient taking differently: Take 5 mg by mouth at bedtime. ) 90 tablet 99  . montelukast (SINGULAIR) 10 MG tablet TAKE 1 TABLET EVERY DAY (Patient taking differently: TAKE 1 TABLET daily at bedtime as needed for allergies) 90 tablet 3  . Omega-3 Fatty Acids (FISH OIL BURP-LESS PO) Take 1 tablet by mouth daily.      . ondansetron (ZOFRAN) 8 MG  tablet 1/2 to 1 tablet up to 3 x daily as needed for nausea. 30 tablet 3  . senna (SENOKOT) 8.6 MG TABS tablet Take 4 tablets by mouth at bedtime.    Marland Kitchen testosterone cypionate (DEPOTESTOTERONE CYPIONATE) 200 MG/ML injection Inject 2 cc IM every 2 weeks or as directed by a physician. 10 mL 2   No current facility-administered medications on file prior to visit.   Medical History:  Past Medical History  Diagnosis Date  . Chest pain   . HTN (hypertension)   . Hyperlipemia   . DJD (degenerative joint disease)   . BPH (benign prostatic hypertrophy)   . Obesity   . OSA (obstructive sleep apnea)   . Hypercholesteremia    Allergies:  Allergies  Allergen Reactions  . Morphine And Related Anaphylaxis and Shortness Of Breath  . Neurontin [Gabapentin] Other (See Comments)    delusional     Review of Systems:  Review of Systems  Constitutional: Negative for fever, chills and malaise/fatigue.  HENT: Negative for congestion, ear pain and sore throat.   Eyes: Negative.   Respiratory: Positive for cough and wheezing. Negative for sputum production and shortness of breath.   Cardiovascular: Negative for chest pain, palpitations and leg swelling.  Gastrointestinal: Positive for constipation. Negative for heartburn, abdominal pain, diarrhea, blood in stool and melena.  Genitourinary: Negative.   Skin: Negative.   Neurological: Negative for dizziness, sensory change, loss of consciousness and headaches.  Psychiatric/Behavioral: Negative for depression. The patient is not nervous/anxious and does not have insomnia.     Family history- Review and unchanged  Social history- Review and unchanged  Physical Exam: BP 132/74 mmHg  Wt 286 lb (129.729 kg) Wt Readings from Last 3 Encounters:  12/17/14 286 lb (129.729 kg)  08/18/14 279 lb 12.8 oz (126.916 kg)  04/21/14 277 lb (125.646 kg)   General Appearance: Well nourished well developed, non-toxic appearing, in no apparent distress. Eyes:  PERRLA, EOMs, conjunctiva no swelling or erythema ENT/Mouth: Ear canals clear with no erythema, swelling, or discharge.  TMs normal bilaterally, oropharynx clear, moist, with no exudate.   Neck: Supple, thyroid normal, no JVD, no cervical adenopathy.  Respiratory: Respiratory effort normal, breath sounds clear A&P, no wheeze, rhonchi or rales noted.  No retractions, no accessory muscle usage Cardio: RRR with no MRGs. No noted edema.  Abdomen: Soft, + BS.  Non tender, no guarding, rebound, hernias, masses. Musculoskeletal: Full ROM, 5/5 strength, Normal gait Skin: Warm, dry without rashes, lesions, ecchymosis.  Neuro: Awake and oriented X 3, Cranial nerves intact. No cerebellar symptoms.  Psych: normal affect, Insight and Judgment appropriate.    Terri Piedra, PA-C 4:34 PM [facility]

## 2014-12-17 NOTE — Patient Instructions (Signed)
Melatonin oral capsules and tablets What is this medicine? MELATONIN (mel uh TOH nin) is a dietary supplement. It is promoted to help maintain normal sleep patterns. The FDA has not approved this supplement for any medical use. This supplement may be used for other purposes; ask your health care provider or pharmacist if you have questions. This medicine may be used for other purposes; ask your health care provider or pharmacist if you have questions. COMMON BRAND NAME(S): Melatonex What should I tell my health care provider before I take this medicine? They need to know if you have any of these conditions: -cancer -if you frequently drink alcohol containing drinks -immune system problems -liver disease -seizure disorder -an unusual or allergic reaction to melatonin, other medicines, foods, dyes, or preservatives -pregnant or trying to get pregnant -breast-feeding How should I use this medicine? Take this supplement by mouth with a glass of water. This supplement is usually taken prior to bedtime. Do not chew or crush most tablets or capsules. Some tablets are chewable and are chewed before swallowing. Some tablets are meant to be dissolved in the mouth or under the tongue. Follow the directions on the package labeling, or take as directed by your health care professional. Do not take this supplement more often than directed. Talk to your pediatrician regarding the use of this supplement in children. This supplement is not recommended for use in children. Overdosage: If you think you have taken too much of this medicine contact a poison control center or emergency room at once. NOTE: This medicine is only for you. Do not share this medicine with others. What if I miss a dose? This does not apply; this medicine is not for regular use. Do not take double or extra doses. What may interact with this medicine? Check with your doctor or healthcare professional if you are taking any of the following  medications: -hormone medicines -medicines for blood pressure like nifedipine -medications for anxiety, depression, or other emotional or psychiatric problems -medications for seizures -medications for sleep -other herbal or dietary supplements -tamoxifen -treatments for cancer or immune disorders This list may not describe all possible interactions. Give your health care provider a list of all the medicines, herbs, non-prescription drugs, or dietary supplements you use. Also tell them if you smoke, drink alcohol, or use illegal drugs. Some items may interact with your medicine. What should I watch for while using this medicine? See your doctor if your symptoms do not get better or if they get worse. Do not take this supplement for more than 2 weeks unless your doctor tells you to. You may get drowsy or dizzy. Do not drive, use machinery, or do anything that needs mental alertness until you know how this medicine affects you. Do not stand or sit up quickly, especially if you are an older patient. This reduces the risk of dizzy or fainting spells. Alcohol may interfere with the effect of this medicine. Avoid alcoholic drinks. Talk to your doctor before you use this supplement if you are currently being treated for an emotional, mental, or sleep problem. This medicine may interfere with your treatment. Herbal or dietary supplements are not regulated like medicines. Rigid quality control standards are not required for dietary supplements. The purity and strength of these products can vary. The safety and effect of this dietary supplement for a certain disease or illness is not well known. This product is not intended to diagnose, treat, cure or prevent any disease. The Food and Drug Administration   suggests the following to help consumers protect themselves: -Always read product labels and follow directions. -Natural does not mean a product is safe for humans to take. -Look for products that include  USP after the ingredient name. This means that the manufacturer followed the standards of the [place] Pharmacopoeia. -Supplements made or sold by a nationally known food or drug company are more likely to be made under tight controls. You can write to the company for more information about how the product was made. What side effects may I notice from receiving this medicine? Side effects that you should report to your doctor or health care professional as soon as possible: -allergic reactions like skin rash, itching or hives, swelling of the face, lips, or tongue -breathing problems -confusion, forgetful -depressed, nervous, or other mood changes -fast or pounding heartbeat -trouble staying awake or alert during the day Side effects that usually do not require medical attention (report to your doctor or health care professional if they continue or are bothersome): -drowsiness, dizziness -headache -nightmares -upset stomach This list may not describe all possible side effects. Call your doctor for medical advice about side effects. You may report side effects to FDA at 1-800-FDA-1088. Where should I keep my medicine? Keep out of the reach of children. Store at room temperature or as directed on the package label. Protect from moisture. Throw away any unused supplement after the expiration date. NOTE: This sheet is a summary. It may not cover all possible information. If you have questions about this medicine, talk to your doctor, pharmacist, or health care provider.  2015, Elsevier/Gold Standard. (2013-03-22 18:36:27)  

## 2014-12-18 LAB — HEPATIC FUNCTION PANEL
ALT: 13 U/L (ref 9–46)
AST: 18 U/L (ref 10–35)
Albumin: 4.1 g/dL (ref 3.6–5.1)
Alkaline Phosphatase: 58 U/L (ref 40–115)
Bilirubin, Direct: 0.1 mg/dL (ref <=0.2)
Indirect Bilirubin: 0.6 mg/dL (ref 0.2–1.2)
Total Bilirubin: 0.7 mg/dL (ref 0.2–1.2)
Total Protein: 6.5 g/dL (ref 6.1–8.1)

## 2014-12-18 LAB — LIPID PANEL
Cholesterol: 140 mg/dL (ref 125–200)
HDL: 30 mg/dL — ABNORMAL LOW (ref >=40)
LDL Cholesterol: 81 mg/dL (ref <130)
Total CHOL/HDL Ratio: 4.7 Ratio (ref <=5.0)
Triglycerides: 144 mg/dL (ref <150)
VLDL: 29 mg/dL (ref <30)

## 2014-12-18 LAB — INSULIN, RANDOM: Insulin: 10.7 u[IU]/mL (ref 2.0–19.6)

## 2014-12-18 LAB — BASIC METABOLIC PANEL WITH GFR
BUN: 7 mg/dL (ref 7–25)
CALCIUM: 9.5 mg/dL (ref 8.6–10.3)
CO2: 33 mEq/L — ABNORMAL HIGH (ref 20–31)
Chloride: 98 mEq/L (ref 98–110)
Creat: 1.15 mg/dL (ref 0.70–1.25)
GFR, EST AFRICAN AMERICAN: 79 mL/min (ref >=60)
GFR, Est Non African American: 68 mL/min (ref >=60)
GLUCOSE: 93 mg/dL (ref 65–99)
POTASSIUM: 3.6 meq/L (ref 3.5–5.3)
Sodium: 137 mEq/L (ref 135–146)

## 2014-12-18 LAB — VITAMIN D 25 HYDROXY (VIT D DEFICIENCY, FRACTURES): VIT D 25 HYDROXY: 81 ng/mL (ref 30–100)

## 2014-12-18 LAB — HEMOGLOBIN A1C
Hgb A1c MFr Bld: 5.7 % — ABNORMAL HIGH (ref <5.7)
MEAN PLASMA GLUCOSE: 117 mg/dL — AB (ref <117)

## 2014-12-18 LAB — TSH: TSH: 2.039 u[IU]/mL (ref 0.350–4.500)

## 2014-12-18 LAB — MAGNESIUM: Magnesium: 1.9 mg/dL (ref 1.5–2.5)

## 2014-12-31 ENCOUNTER — Other Ambulatory Visit: Payer: Self-pay | Admitting: Internal Medicine

## 2014-12-31 NOTE — Telephone Encounter (Signed)
Called Rx into [facility] 

## 2015-01-02 ENCOUNTER — Other Ambulatory Visit: Payer: Self-pay | Admitting: Internal Medicine

## 2015-01-02 NOTE — Telephone Encounter (Signed)
Called Rx into [facility] 

## 2015-01-05 ENCOUNTER — Ambulatory Visit (INDEPENDENT_AMBULATORY_CARE_PROVIDER_SITE_OTHER): Payer: Medicare Other | Admitting: Internal Medicine

## 2015-01-05 ENCOUNTER — Encounter: Payer: Self-pay | Admitting: Internal Medicine

## 2015-01-05 ENCOUNTER — Other Ambulatory Visit: Payer: Self-pay | Admitting: *Deleted

## 2015-01-05 VITALS — BP 132/84 | HR 64 | Temp 97.7°F | Resp 16 | Ht 74.5 in | Wt 272.8 lb

## 2015-01-05 DIAGNOSIS — R002 Palpitations: Secondary | ICD-10-CM

## 2015-01-05 DIAGNOSIS — G4700 Insomnia, unspecified: Secondary | ICD-10-CM

## 2015-01-05 DIAGNOSIS — R7303 Prediabetes: Secondary | ICD-10-CM

## 2015-01-05 DIAGNOSIS — Z6834 Body mass index (BMI) 34.0-34.9, adult: Secondary | ICD-10-CM

## 2015-01-05 DIAGNOSIS — R7309 Other abnormal glucose: Secondary | ICD-10-CM | POA: Diagnosis not present

## 2015-01-05 DIAGNOSIS — G894 Chronic pain syndrome: Secondary | ICD-10-CM | POA: Diagnosis not present

## 2015-01-05 DIAGNOSIS — I10 Essential (primary) hypertension: Secondary | ICD-10-CM

## 2015-01-05 MED ORDER — PREGABALIN 50 MG PO CAPS: ORAL_CAPSULE | ORAL | Status: DC

## 2015-01-05 MED ORDER — PREGABALIN 75 MG PO CAPS: ORAL_CAPSULE | ORAL | Status: DC

## 2015-01-05 NOTE — Progress Notes (Signed)
Patient ID: Jared [name], male   DOB: 04-19-1956, 59 y.o.   MRN: [id number]     This very nice 59 y.o.  MWM  with Hypertension, Hyperlipidemia, Pre-Diabetes, Vitamin D Deficiency and chronic Pain Syndrome followed at the [facility] in [facility] presents emergently today somewhat intense, agitated and  irritable. He attributes his sx's to Wellbutrin that he was put on over 5 months ago to compliment his citalopram that he was already taking for his depression. He self describes with sx's of impulsiveness,irritability, aggressive thoughts, depression and mood swings for the last 2 weeks. Denies HI or SI. These are similar to the sx's that he c/o back in April and now he has decided that the sx/s are due to the Wellbutrin. He reports home BP's have occasionally been elevated to the range 150-160/90's.He reports occasional rapid palpitations. He also relates being very "stressed out" trying to monitor/manage his father in [place] with Alzheimers' Dz and also relates that his wife is Bipolar, very unpredictable, argumentative and occasionally explosive and that he always feels she can snap at any moment and start an argument.      Further patient is treated for HTN & BP has been controlled at home. Today's BP: 132/84 mmHg. EKG today found NSR w/o ectopy. Patient has had no complaints of any cardiac type chest pain, palpitations, dyspnea/orthopnea/PND, dizziness, claudication, or dependent edema. Patient also is on CPAP for OSA, but reports much difficulty with insomnia.      Hyperlipidemia is controlled with diet & meds. Patient denies myalgias or other med SE's. Last Lipids were at goal -  Cholesterol 140; HDL 30*; LDL 81; Triglycerides 144 on 12/17/2014.     Also, the patient has history of PreDiabetes with A1c 5.9% in Feb 2013 and has had no symptoms of reactive hypoglycemia, diabetic polys, paresthesias or visual blurring.  Last A1c was  5.7% on  12/17/2014.     Further, the patient also has history of  Vitamin D Deficiency of 32 in 2009 and supplements vitamin D without any suspected side-effects. Last vitamin D was  81 on 12/17/2014.  Medication Sig  . albuterol (PROVENTIL HFA;VENTOLIN HFA) 108 (90 BASE) MCG/ACT inhaler Inhale 2 puffs into the lungs every 2 (two) hours as needed for wheezing or shortness of breath (cough).  . ALPRAZolam (XANAX) 1 MG tablet TAKE 1/2-1 TABLET BY MOUTH 3 TIMES DAILY  . atorvastatin (LIPITOR) 40 MG tablet TAKE 1 TABLET BY MOUTH DAILY OR AS DIRECTED FOR CHOLESTEROL  . Cholecalciferol (VITAMIN D-3) 5000 UNITS TABS Take 3 tablets by mouth daily.   . citalopram (CELEXA) 40 MG tablet TAKE 1 TABLET BY MOUTH EVERY DAY FOR MOOD  . docusate sodium (COLACE) 100 MG capsule Take 200 mg by mouth at bedtime.  . Flaxseed, Linseed, (FLAXSEED OIL PO) Take 1 tablet by mouth daily.    . fluticasone (FLONASE) 50 MCG/ACT nasal spray USE 1 TO 2 SPRAYS IN EACHNOSTRIL ONCE DAILY  . losartan-hydrochlorothiazide (HYZAAR) 100-25 MG per tablet TAKE 1 TABLET BY MOUTH EVERY DAY  . minoxidil (LONITEN) 10 MG tablet Take 1 tablet daily or as directed for BP (Patient taking differently: Take 5 mg by mouth at bedtime. )  . montelukast (SINGULAIR) 10 MG tablet TAKE 1 TABLET EVERY DAY (Patient taking differently: TAKE 1 TABLET daily at bedtime as needed for allergies)  . Omega-3 Fatty Acids (FISH OIL BURP-LESS PO) Take 1 tablet by mouth daily.    . ondansetron (ZOFRAN) 8 MG tablet 1/2 to  1 tablet up to 3 x daily as needed for nausea.  Marland Kitchen senna (SENOKOT) 8.6 MG TABS tablet Take 4 tablets by mouth at bedtime.  Marland Kitchen testosterone cypionate (DEPOTESTOTERONE CYPIONATE) 200 MG/ML injection Inject 2 cc IM every 2 weeks or as directed by a physician.  Marland Kitchen azelastine (ASTELIN) 0.1 % nasal spray 1 to 2 sprays each nostril every 12 hours (Patient taking differently: Place 1-2 sprays into both nostrils 2 (two) times daily as needed for allergies. 1 to 2 sprays each nostril every 12 hours)  .  bisoprolol-hydrochlorothiazide (ZIAC) 10-6.25 MG per tablet TAKE 1 TABLET IN THE MORNING FOR BLOOD PRESSURE  . buPROPion (WELLBUTRIN XL) 300 MG 24 hr tablet Take 1 tablet each morning for mood   Allergies  Allergen Reactions  . Morphine And Related Anaphylaxis and Shortness Of Breath  . Neurontin [Gabapentin] Other (See Comments)    delusional   PMHx:   Past Medical History  Diagnosis Date  . Chest pain   . HTN (hypertension)   . Hyperlipemia   . DJD (degenerative joint disease)   . BPH (benign prostatic hypertrophy)   . Obesity   . OSA (obstructive sleep apnea)   . Hypercholesteremia    Immunization History  Administered Date(s) Administered  . DT 08/18/2014  . Influenza-Unspecified 05/12/2011  . Pneumococcal-Unspecified 05/23/2006  . Td 03/24/2005   Past Surgical History  Procedure Laterality Date  . Back surgery    . Prostatectomy     FHx:    Reviewed / unchanged  SHx:    Reviewed / unchanged  Systems Review:  Constitutional: Denies fever, chills, wt changes, headaches, insomnia, fatigue, night sweats, change in appetite. Eyes: Denies redness, blurred vision, diplopia, discharge, itchy, watery eyes.  ENT: Denies discharge, congestion, post nasal drip, epistaxis, sore throat, earache, hearing loss, dental pain, tinnitus, vertigo, sinus pain, snoring.  CV: Denies chest pain, palpitations, irregular heartbeat, syncope, dyspnea, diaphoresis, orthopnea, PND, claudication or edema. Respiratory: denies cough, dyspnea, DOE, pleurisy, hoarseness, laryngitis, wheezing.  Gastrointestinal: Denies dysphagia, odynophagia, heartburn, reflux, water brash, abdominal pain or cramps, nausea, vomiting, bloating, diarrhea, constipation, hematemesis, melena, hematochezia  or hemorrhoids. Genitourinary: Denies dysuria, frequency, urgency, nocturia, hesitancy, discharge, hematuria or flank pain. Musculoskeletal: Denies arthralgias, myalgias, stiffness, jt. swelling, pain, limping or  strain/sprain.  Skin: Denies pruritus, rash, hives, warts, acne, eczema or change in skin lesion(s). Neuro: No weakness, tremor, incoordination, spasms, paresthesia or pain. Psychiatric: Denies confusion, memory loss or sensory loss. Endo: Denies change in weight, skin or hair change.  Heme/Lymph: No excessive bleeding, bruising or enlarged lymph nodes.  Physical Exam  BP 132/84   Pulse 64  Temp 97.7 F   Resp 16  Ht 6' 2.5"   Wt 272 lb 12.8 oz     BMI 34.57  Appears well nourished and in no distress. Eyes: PERRLA, EOMs, conjunctiva no swelling or erythema. Sinuses: No frontal/maxillary tenderness ENT/Mouth: EAC's clear, TM's nl w/o erythema, bulging. Nares clear w/o erythema, swelling, exudates. Oropharynx clear without erythema or exudates. Oral hygiene is good. Tongue normal, non obstructing. Hearing intact.  Neck: Supple. Thyroid nl. Car 2+/2+ without bruits, nodes or JVD. Chest: Respirations nl with BS clear & equal w/o rales, rhonchi, wheezing or stridor.  Cor: Heart sounds normal w/ regular rate and rhythm without sig. murmurs, gallops, clicks, or rubs. Peripheral pulses normal and equal  without edema.  Abdomen: Soft & bowel sounds normal. Non-tender w/o guarding, rebound, hernias, masses, or organomegaly.  Lymphatics: Unremarkable.  Musculoskeletal: Full ROM all peripheral  extremities, joint stability, 5/5 strength, and normal gait.  Skin: Warm, dry without exposed rashes, lesions or ecchymosis apparent.  Neuro: Cranial nerves intact, reflexes equal bilaterally. Sensory-motor testing grossly intact. Tendon reflexes grossly intact.  Pysch: Alert & oriented x 3.  Insight and judgement nl & appropriate. No ideations.  Assessment and Plan:  1. Essential hypertension   2. Palpitations  - EKG 12-Lead  3. Insomnia  - pregabalin (LYRICA) 50 MG capsule; Take 1 to 3 capsules 1 hour before sleep  Dispense: 21 capsule; Refill: 0 - pregabalin (LYRICA) 75 MG capsule; Take 1 to 2  capsules 1 hour before sleep  Dispense: 14 capsule; Refill: 0  4. Chronic pain syndrome  - pregabalin (LYRICA) 50 MG capsule; Take 1 to 3 capsules 1 hour before sleep  Dispense: 21 capsule; Refill: 0 - pregabalin (LYRICA) 75 MG capsule; Take 1 to 2 capsules 1 hour before sleep  Dispense: 14 capsule; Refill: 0  5. Acute /Chronic Anxiety      Advised to stop his Wellbutrin altho I do not feel that is is the cause of his sx's, but more likely due to his personal life stressors. Recc ROV 3 weeks. Recommended regular exercise, BP monitoring, weight control, and discussed med and SE's. Recommended labs to assess and monitor clinical status. Further disposition pending results of labs. Over 30 minutes of exam, counseling, chart review was performed

## 2015-01-05 NOTE — Patient Instructions (Signed)
Stop Wellbutrin  Start Lyrica 50 mg (or 75 mg)  - take 1 up to 3 tablets 1 hour before sleep  [Maximum dose is 600 mg/da]

## 2015-01-08 ENCOUNTER — Emergency Department (HOSPITAL_COMMUNITY): Payer: Medicare Other

## 2015-01-08 ENCOUNTER — Inpatient Hospital Stay (HOSPITAL_COMMUNITY): Payer: Medicare Other

## 2015-01-08 ENCOUNTER — Inpatient Hospital Stay (HOSPITAL_COMMUNITY): Payer: Medicare Other | Admitting: Anesthesiology

## 2015-01-08 ENCOUNTER — Inpatient Hospital Stay (HOSPITAL_COMMUNITY)
Admission: EM | Admit: 2015-01-08 | Discharge: 2015-02-11 | DRG: 853 | Disposition: A | Discharge status: Other Acute Inpatient Hospital | Payer: Medicare Other | Attending: Pulmonary Disease | Admitting: Pulmonary Disease

## 2015-01-08 ENCOUNTER — Encounter (HOSPITAL_COMMUNITY)
Admission: EM | Disposition: A | Discharge status: Nursing Home (Includes ICF) | Payer: Self-pay | Source: Home / Self Care | Attending: Pulmonary Disease

## 2015-01-08 ENCOUNTER — Encounter (HOSPITAL_COMMUNITY): Payer: Self-pay | Admitting: *Deleted

## 2015-01-08 DIAGNOSIS — J9690 Respiratory failure, unspecified, unspecified whether with hypoxia or hypercapnia: Secondary | ICD-10-CM

## 2015-01-08 DIAGNOSIS — G8929 Other chronic pain: Secondary | ICD-10-CM | POA: Diagnosis present

## 2015-01-08 DIAGNOSIS — R0602 Shortness of breath: Secondary | ICD-10-CM | POA: Diagnosis not present

## 2015-01-08 DIAGNOSIS — D689 Coagulation defect, unspecified: Secondary | ICD-10-CM | POA: Diagnosis not present

## 2015-01-08 DIAGNOSIS — I214 Non-ST elevation (NSTEMI) myocardial infarction: Secondary | ICD-10-CM | POA: Diagnosis not present

## 2015-01-08 DIAGNOSIS — R140 Abdominal distension (gaseous): Secondary | ICD-10-CM | POA: Diagnosis not present

## 2015-01-08 DIAGNOSIS — R001 Bradycardia, unspecified: Secondary | ICD-10-CM | POA: Diagnosis not present

## 2015-01-08 DIAGNOSIS — Z87891 Personal history of nicotine dependence: Secondary | ICD-10-CM | POA: Diagnosis not present

## 2015-01-08 DIAGNOSIS — I272 Other secondary pulmonary hypertension: Secondary | ICD-10-CM | POA: Diagnosis not present

## 2015-01-08 DIAGNOSIS — K567 Ileus, unspecified: Secondary | ICD-10-CM | POA: Diagnosis not present

## 2015-01-08 DIAGNOSIS — N170 Acute kidney failure with tubular necrosis: Secondary | ICD-10-CM | POA: Diagnosis present

## 2015-01-08 DIAGNOSIS — I2699 Other pulmonary embolism without acute cor pulmonale: Secondary | ICD-10-CM | POA: Diagnosis not present

## 2015-01-08 DIAGNOSIS — Z66 Do not resuscitate: Secondary | ICD-10-CM | POA: Diagnosis not present

## 2015-01-08 DIAGNOSIS — T8132XA Disruption of internal operation (surgical) wound, not elsewhere classified, initial encounter: Secondary | ICD-10-CM | POA: Diagnosis not present

## 2015-01-08 DIAGNOSIS — M1990 Unspecified osteoarthritis, unspecified site: Secondary | ICD-10-CM | POA: Diagnosis present

## 2015-01-08 DIAGNOSIS — E872 Acidosis, unspecified: Secondary | ICD-10-CM | POA: Diagnosis present

## 2015-01-08 DIAGNOSIS — G40301 Generalized idiopathic epilepsy and epileptic syndromes, not intractable, with status epilepticus: Secondary | ICD-10-CM | POA: Diagnosis not present

## 2015-01-08 DIAGNOSIS — J80 Acute respiratory distress syndrome: Secondary | ICD-10-CM | POA: Diagnosis not present

## 2015-01-08 DIAGNOSIS — Z4682 Encounter for fitting and adjustment of non-vascular catheter: Secondary | ICD-10-CM | POA: Diagnosis not present

## 2015-01-08 DIAGNOSIS — E871 Hypo-osmolality and hyponatremia: Secondary | ICD-10-CM | POA: Diagnosis present

## 2015-01-08 DIAGNOSIS — I5022 Chronic systolic (congestive) heart failure: Secondary | ICD-10-CM | POA: Diagnosis present

## 2015-01-08 DIAGNOSIS — T8131XA Disruption of external operation (surgical) wound, not elsewhere classified, initial encounter: Secondary | ICD-10-CM | POA: Diagnosis not present

## 2015-01-08 DIAGNOSIS — J811 Chronic pulmonary edema: Secondary | ICD-10-CM | POA: Diagnosis present

## 2015-01-08 DIAGNOSIS — D72829 Elevated white blood cell count, unspecified: Secondary | ICD-10-CM | POA: Insufficient documentation

## 2015-01-08 DIAGNOSIS — N179 Acute kidney failure, unspecified: Secondary | ICD-10-CM | POA: Diagnosis present

## 2015-01-08 DIAGNOSIS — R609 Edema, unspecified: Secondary | ICD-10-CM | POA: Diagnosis not present

## 2015-01-08 DIAGNOSIS — I252 Old myocardial infarction: Secondary | ICD-10-CM | POA: Diagnosis present

## 2015-01-08 DIAGNOSIS — R7989 Other specified abnormal findings of blood chemistry: Secondary | ICD-10-CM | POA: Diagnosis not present

## 2015-01-08 DIAGNOSIS — K7200 Acute and subacute hepatic failure without coma: Secondary | ICD-10-CM | POA: Diagnosis not present

## 2015-01-08 DIAGNOSIS — D62 Acute posthemorrhagic anemia: Secondary | ICD-10-CM | POA: Diagnosis not present

## 2015-01-08 DIAGNOSIS — I2510 Atherosclerotic heart disease of native coronary artery without angina pectoris: Secondary | ICD-10-CM | POA: Diagnosis present

## 2015-01-08 DIAGNOSIS — G4700 Insomnia, unspecified: Secondary | ICD-10-CM

## 2015-01-08 DIAGNOSIS — I4901 Ventricular fibrillation: Secondary | ICD-10-CM | POA: Diagnosis not present

## 2015-01-08 DIAGNOSIS — A419 Sepsis, unspecified organism: Secondary | ICD-10-CM | POA: Diagnosis not present

## 2015-01-08 DIAGNOSIS — G40401 Other generalized epilepsy and epileptic syndromes, not intractable, with status epilepticus: Secondary | ICD-10-CM | POA: Diagnosis not present

## 2015-01-08 DIAGNOSIS — I6783 Posterior reversible encephalopathy syndrome: Secondary | ICD-10-CM | POA: Diagnosis not present

## 2015-01-08 DIAGNOSIS — R0902 Hypoxemia: Secondary | ICD-10-CM

## 2015-01-08 DIAGNOSIS — G9340 Encephalopathy, unspecified: Secondary | ICD-10-CM | POA: Diagnosis not present

## 2015-01-08 DIAGNOSIS — Z6836 Body mass index (BMI) 36.0-36.9, adult: Secondary | ICD-10-CM

## 2015-01-08 DIAGNOSIS — Z992 Dependence on renal dialysis: Secondary | ICD-10-CM

## 2015-01-08 DIAGNOSIS — D751 Secondary polycythemia: Secondary | ICD-10-CM | POA: Diagnosis present

## 2015-01-08 DIAGNOSIS — Z01818 Encounter for other preprocedural examination: Secondary | ICD-10-CM

## 2015-01-08 DIAGNOSIS — R100 Acute abdomen: Secondary | ICD-10-CM | POA: Diagnosis not present

## 2015-01-08 DIAGNOSIS — D696 Thrombocytopenia, unspecified: Secondary | ICD-10-CM | POA: Diagnosis not present

## 2015-01-08 DIAGNOSIS — G9341 Metabolic encephalopathy: Secondary | ICD-10-CM | POA: Diagnosis not present

## 2015-01-08 DIAGNOSIS — N400 Enlarged prostate without lower urinary tract symptoms: Secondary | ICD-10-CM | POA: Diagnosis present

## 2015-01-08 DIAGNOSIS — N390 Urinary tract infection, site not specified: Secondary | ICD-10-CM | POA: Diagnosis not present

## 2015-01-08 DIAGNOSIS — G4733 Obstructive sleep apnea (adult) (pediatric): Secondary | ICD-10-CM | POA: Diagnosis not present

## 2015-01-08 DIAGNOSIS — R6521 Severe sepsis with septic shock: Secondary | ICD-10-CM | POA: Diagnosis not present

## 2015-01-08 DIAGNOSIS — E876 Hypokalemia: Secondary | ICD-10-CM | POA: Diagnosis not present

## 2015-01-08 DIAGNOSIS — I509 Heart failure, unspecified: Secondary | ICD-10-CM | POA: Diagnosis not present

## 2015-01-08 DIAGNOSIS — Z978 Presence of other specified devices: Secondary | ICD-10-CM

## 2015-01-08 DIAGNOSIS — M549 Dorsalgia, unspecified: Secondary | ICD-10-CM | POA: Diagnosis present

## 2015-01-08 DIAGNOSIS — R1010 Upper abdominal pain, unspecified: Secondary | ICD-10-CM | POA: Diagnosis not present

## 2015-01-08 DIAGNOSIS — I5021 Acute systolic (congestive) heart failure: Secondary | ICD-10-CM | POA: Diagnosis present

## 2015-01-08 DIAGNOSIS — I482 Chronic atrial fibrillation, unspecified: Secondary | ICD-10-CM | POA: Diagnosis present

## 2015-01-08 DIAGNOSIS — I10 Essential (primary) hypertension: Secondary | ICD-10-CM | POA: Diagnosis not present

## 2015-01-08 DIAGNOSIS — R410 Disorientation, unspecified: Secondary | ICD-10-CM | POA: Diagnosis present

## 2015-01-08 DIAGNOSIS — R570 Cardiogenic shock: Secondary | ICD-10-CM | POA: Diagnosis not present

## 2015-01-08 DIAGNOSIS — K759 Inflammatory liver disease, unspecified: Secondary | ICD-10-CM | POA: Diagnosis not present

## 2015-01-08 DIAGNOSIS — I4891 Unspecified atrial fibrillation: Secondary | ICD-10-CM | POA: Diagnosis present

## 2015-01-08 DIAGNOSIS — N19 Unspecified kidney failure: Secondary | ICD-10-CM | POA: Diagnosis present

## 2015-01-08 DIAGNOSIS — I501 Left ventricular failure: Secondary | ICD-10-CM | POA: Diagnosis not present

## 2015-01-08 DIAGNOSIS — IMO0001 Reserved for inherently not codable concepts without codable children: Secondary | ICD-10-CM | POA: Diagnosis present

## 2015-01-08 DIAGNOSIS — G9389 Other specified disorders of brain: Secondary | ICD-10-CM | POA: Diagnosis not present

## 2015-01-08 DIAGNOSIS — R51 Headache: Secondary | ICD-10-CM | POA: Diagnosis not present

## 2015-01-08 DIAGNOSIS — I469 Cardiac arrest, cause unspecified: Secondary | ICD-10-CM | POA: Diagnosis present

## 2015-01-08 DIAGNOSIS — I480 Paroxysmal atrial fibrillation: Secondary | ICD-10-CM | POA: Diagnosis present

## 2015-01-08 DIAGNOSIS — R109 Unspecified abdominal pain: Secondary | ICD-10-CM | POA: Diagnosis not present

## 2015-01-08 DIAGNOSIS — D649 Anemia, unspecified: Secondary | ICD-10-CM | POA: Diagnosis not present

## 2015-01-08 DIAGNOSIS — I472 Ventricular tachycardia: Secondary | ICD-10-CM | POA: Diagnosis present

## 2015-01-08 DIAGNOSIS — N189 Chronic kidney disease, unspecified: Secondary | ICD-10-CM | POA: Diagnosis present

## 2015-01-08 DIAGNOSIS — J984 Other disorders of lung: Secondary | ICD-10-CM | POA: Diagnosis not present

## 2015-01-08 DIAGNOSIS — E785 Hyperlipidemia, unspecified: Secondary | ICD-10-CM | POA: Diagnosis present

## 2015-01-08 DIAGNOSIS — N186 End stage renal disease: Secondary | ICD-10-CM | POA: Diagnosis present

## 2015-01-08 DIAGNOSIS — J9611 Chronic respiratory failure with hypoxia: Secondary | ICD-10-CM | POA: Diagnosis not present

## 2015-01-08 DIAGNOSIS — M545 Low back pain: Secondary | ICD-10-CM | POA: Diagnosis present

## 2015-01-08 DIAGNOSIS — R1084 Generalized abdominal pain: Secondary | ICD-10-CM

## 2015-01-08 DIAGNOSIS — I50810 Right heart failure, unspecified: Secondary | ICD-10-CM

## 2015-01-08 DIAGNOSIS — E874 Mixed disorder of acid-base balance: Secondary | ICD-10-CM | POA: Diagnosis present

## 2015-01-08 DIAGNOSIS — R509 Fever, unspecified: Secondary | ICD-10-CM

## 2015-01-08 DIAGNOSIS — J9600 Acute respiratory failure, unspecified whether with hypoxia or hypercapnia: Secondary | ICD-10-CM | POA: Diagnosis not present

## 2015-01-08 DIAGNOSIS — E669 Obesity, unspecified: Secondary | ICD-10-CM | POA: Diagnosis present

## 2015-01-08 DIAGNOSIS — I120 Hypertensive chronic kidney disease with stage 5 chronic kidney disease or end stage renal disease: Secondary | ICD-10-CM | POA: Diagnosis present

## 2015-01-08 DIAGNOSIS — N182 Chronic kidney disease, stage 2 (mild): Secondary | ICD-10-CM | POA: Diagnosis not present

## 2015-01-08 DIAGNOSIS — R4182 Altered mental status, unspecified: Secondary | ICD-10-CM

## 2015-01-08 DIAGNOSIS — E875 Hyperkalemia: Secondary | ICD-10-CM | POA: Diagnosis present

## 2015-01-08 DIAGNOSIS — R739 Hyperglycemia, unspecified: Secondary | ICD-10-CM | POA: Diagnosis present

## 2015-01-08 DIAGNOSIS — R918 Other nonspecific abnormal finding of lung field: Secondary | ICD-10-CM | POA: Diagnosis not present

## 2015-01-08 DIAGNOSIS — G894 Chronic pain syndrome: Secondary | ICD-10-CM

## 2015-01-08 DIAGNOSIS — J90 Pleural effusion, not elsewhere classified: Secondary | ICD-10-CM | POA: Diagnosis not present

## 2015-01-08 DIAGNOSIS — J9601 Acute respiratory failure with hypoxia: Secondary | ICD-10-CM | POA: Diagnosis present

## 2015-01-08 DIAGNOSIS — R931 Abnormal findings on diagnostic imaging of heart and coronary circulation: Secondary | ICD-10-CM

## 2015-01-08 DIAGNOSIS — T17908A Unspecified foreign body in respiratory tract, part unspecified causing other injury, initial encounter: Secondary | ICD-10-CM

## 2015-01-08 DIAGNOSIS — R579 Shock, unspecified: Secondary | ICD-10-CM | POA: Diagnosis not present

## 2015-01-08 DIAGNOSIS — R34 Anuria and oliguria: Secondary | ICD-10-CM | POA: Diagnosis not present

## 2015-01-08 DIAGNOSIS — R519 Headache, unspecified: Secondary | ICD-10-CM

## 2015-01-08 DIAGNOSIS — N185 Chronic kidney disease, stage 5: Secondary | ICD-10-CM | POA: Diagnosis not present

## 2015-01-08 DIAGNOSIS — R1110 Vomiting, unspecified: Secondary | ICD-10-CM | POA: Diagnosis not present

## 2015-01-08 DIAGNOSIS — J9811 Atelectasis: Secondary | ICD-10-CM | POA: Diagnosis not present

## 2015-01-08 DIAGNOSIS — N2889 Other specified disorders of kidney and ureter: Secondary | ICD-10-CM | POA: Diagnosis not present

## 2015-01-08 DIAGNOSIS — R569 Unspecified convulsions: Secondary | ICD-10-CM | POA: Diagnosis not present

## 2015-01-08 DIAGNOSIS — I517 Cardiomegaly: Secondary | ICD-10-CM | POA: Diagnosis not present

## 2015-01-08 DIAGNOSIS — R5381 Other malaise: Secondary | ICD-10-CM | POA: Diagnosis not present

## 2015-01-08 HISTORY — DX: Depression, unspecified: F32.A

## 2015-01-08 HISTORY — DX: Major depressive disorder, single episode, unspecified: F32.9

## 2015-01-08 HISTORY — PX: APPLICATION OF WOUND VAC: SHX5189

## 2015-01-08 HISTORY — DX: Anxiety disorder, unspecified: F41.9

## 2015-01-08 HISTORY — DX: Dorsalgia, unspecified: M54.9

## 2015-01-08 HISTORY — DX: Other chronic pain: G89.29

## 2015-01-08 HISTORY — PX: LAPAROTOMY: SHX154

## 2015-01-08 LAB — I-STAT CHEM 8, ED
BUN: 51 mg/dL — AB (ref 6–20)
BUN: 46 mg/dL — ABNORMAL HIGH (ref 6–20)
CALCIUM ION: 0.68 mmol/L — AB (ref 1.13–1.30)
CHLORIDE: 94 mmol/L — AB (ref 101–111)
CREATININE: 3.7 mg/dL — AB (ref 0.61–1.24)
Calcium, Ion: 1 mmol/L — ABNORMAL LOW (ref 1.13–1.30)
Chloride: 91 mmol/L — ABNORMAL LOW (ref 101–111)
Creatinine, Ser: 4.4 mg/dL — ABNORMAL HIGH (ref 0.61–1.24)
GLUCOSE: 444 mg/dL — AB (ref 65–99)
Glucose, Bld: 155 mg/dL — ABNORMAL HIGH (ref 65–99)
HCT: 47 % (ref 39.0–52.0)
HEMATOCRIT: 55 % — AB (ref 39.0–52.0)
HEMOGLOBIN: 16 g/dL (ref 13.0–17.0)
Hemoglobin: 18.7 g/dL — ABNORMAL HIGH (ref 13.0–17.0)
Potassium: 6.1 mmol/L (ref 3.5–5.1)
Potassium: 6.6 mmol/L (ref 3.5–5.1)
SODIUM: 129 mmol/L — AB (ref 135–145)
SODIUM: 123 mmol/L — AB (ref 135–145)
TCO2: 19 mmol/L (ref 0–100)
TCO2: 17 mmol/L (ref 0–100)

## 2015-01-08 LAB — CBC WITH DIFFERENTIAL/PLATELET
Basophils Absolute: 0 10*3/uL (ref 0.0–0.1)
Basophils Relative: 0 % (ref 0–1)
EOS ABS: 0 10*3/uL (ref 0.0–0.7)
EOS PCT: 0 % (ref 0–5)
HCT: 48.8 % (ref 39.0–52.0)
Hemoglobin: 17.1 g/dL — ABNORMAL HIGH (ref 13.0–17.0)
LYMPHS PCT: 3 % — AB (ref 12–46)
Lymphs Abs: 0.6 10*3/uL — ABNORMAL LOW (ref 0.7–4.0)
MCH: 31.5 pg (ref 26.0–34.0)
MCHC: 35 g/dL (ref 30.0–36.0)
MCV: 89.9 fL (ref 78.0–100.0)
MONOS PCT: 3 % (ref 3–12)
Monocytes Absolute: 0.7 10*3/uL (ref 0.1–1.0)
Neutro Abs: 19.2 10*3/uL — ABNORMAL HIGH (ref 1.7–7.7)
Neutrophils Relative %: 94 % — ABNORMAL HIGH (ref 43–77)
Platelets: 186 10*3/uL (ref 150–400)
RBC: 5.43 MIL/uL (ref 4.22–5.81)
RDW: 12.8 % (ref 11.5–15.5)
WBC: 20.5 10*3/uL — ABNORMAL HIGH (ref 4.0–10.5)

## 2015-01-08 LAB — RAPID URINE DRUG SCREEN, HOSP PERFORMED
Amphetamines: NOT DETECTED
Barbiturates: NOT DETECTED
Benzodiazepines: POSITIVE — AB
Cocaine: NOT DETECTED
OPIATES: POSITIVE — AB
TETRAHYDROCANNABINOL: NOT DETECTED

## 2015-01-08 LAB — TYPE AND SCREEN
ABO/RH(D): A POS
Antibody Screen: NEGATIVE

## 2015-01-08 LAB — BLOOD GAS, ARTERIAL
ACID-BASE DEFICIT: 9.4 mmol/L — AB (ref 0.0–2.0)
Bicarbonate: 15.5 mEq/L — ABNORMAL LOW (ref 20.0–24.0)
FIO2: 1
MECHVT: 670 mL
O2 Saturation: 97.8 %
PATIENT TEMPERATURE: 98.6
PCO2 ART: 31.2 mmHg — AB (ref 35.0–45.0)
PEEP/CPAP: 5 cmH2O
PH ART: 7.317 — AB (ref 7.350–7.450)
PO2 ART: 137 mmHg — AB (ref 80.0–100.0)
RATE: 25 resp/min
TCO2: 16.5 mmol/L (ref 0–100)

## 2015-01-08 LAB — BASIC METABOLIC PANEL
ANION GAP: 23 — AB (ref 5–15)
BUN: 46 mg/dL — ABNORMAL HIGH (ref 6–20)
CALCIUM: 8 mg/dL — AB (ref 8.9–10.3)
CO2: 15 mmol/L — AB (ref 22–32)
CREATININE: 4.39 mg/dL — AB (ref 0.61–1.24)
Chloride: 99 mmol/L — ABNORMAL LOW (ref 101–111)
GFR calc Af Amer: 15 mL/min — ABNORMAL LOW (ref >60)
GFR, EST NON AFRICAN AMERICAN: 13 mL/min — AB (ref >60)
GLUCOSE: 271 mg/dL — AB (ref 65–99)
Potassium: 3.5 mmol/L (ref 3.5–5.1)
Sodium: 137 mmol/L (ref 135–145)

## 2015-01-08 LAB — POCT I-STAT 7, (LYTES, BLD GAS, ICA,H+H)
Acid-base deficit: 15 mmol/L — ABNORMAL HIGH (ref 0.0–2.0)
Bicarbonate: 16 mEq/L — ABNORMAL LOW (ref 20.0–24.0)
CALCIUM ION: 1.26 mmol/L (ref 1.13–1.30)
HCT: 44 % (ref 39.0–52.0)
Hemoglobin: 15 g/dL (ref 13.0–17.0)
O2 Saturation: 84 %
PCO2 ART: 58.6 mmHg — AB (ref 35.0–45.0)
PO2 ART: 71 mmHg — AB (ref 80.0–100.0)
POTASSIUM: 5 mmol/L (ref 3.5–5.1)
Sodium: 135 mmol/L (ref 135–145)
TCO2: 18 mmol/L (ref 0–100)
pH, Arterial: 7.045 — CL (ref 7.350–7.450)

## 2015-01-08 LAB — URINALYSIS, ROUTINE W REFLEX MICROSCOPIC
Bilirubin Urine: NEGATIVE
Glucose, UA: 100 mg/dL — AB
Ketones, ur: NEGATIVE mg/dL
Leukocytes, UA: NEGATIVE
NITRITE: NEGATIVE
SPECIFIC GRAVITY, URINE: 1.02 (ref 1.005–1.030)
UROBILINOGEN UA: 1 mg/dL (ref 0.0–1.0)
pH: 5.5 (ref 5.0–8.0)

## 2015-01-08 LAB — TROPONIN I
TROPONIN I: 15.07 ng/mL — AB (ref <0.031)
Troponin I: 0.47 ng/mL — ABNORMAL HIGH (ref <0.031)
Troponin I: 1.13 ng/mL (ref <0.031)

## 2015-01-08 LAB — PREPARE FRESH FROZEN PLASMA
UNIT DIVISION: 0
UNIT DIVISION: 0
Unit division: 0

## 2015-01-08 LAB — I-STAT ARTERIAL BLOOD GAS, ED
Acid-base deficit: 9 mmol/L — ABNORMAL HIGH (ref 0.0–2.0)
BICARBONATE: 16.6 meq/L — AB (ref 20.0–24.0)
O2 SAT: 100 %
PCO2 ART: 32.7 mmHg — AB (ref 35.0–45.0)
Patient temperature: 97.4
TCO2: 18 mmol/L (ref 0–100)
pH, Arterial: 7.31 — ABNORMAL LOW (ref 7.350–7.450)
pO2, Arterial: 281 mmHg — ABNORMAL HIGH (ref 80.0–100.0)

## 2015-01-08 LAB — PROTIME-INR
INR: 1.54 — ABNORMAL HIGH (ref 0.00–1.49)
Prothrombin Time: 18.5 seconds — ABNORMAL HIGH (ref 11.6–15.2)

## 2015-01-08 LAB — POCT I-STAT 3, ART BLOOD GAS (G3+)
Acid-base deficit: 12 mmol/L — ABNORMAL HIGH (ref 0.0–2.0)
Bicarbonate: 15.3 mEq/L — ABNORMAL LOW (ref 20.0–24.0)
O2 Saturation: 91 %
PCO2 ART: 37.4 mmHg (ref 35.0–45.0)
PH ART: 7.218 — AB (ref 7.350–7.450)
PO2 ART: 72 mmHg — AB (ref 80.0–100.0)
Patient temperature: 97.9
TCO2: 16 mmol/L (ref 0–100)

## 2015-01-08 LAB — URINE MICROSCOPIC-ADD ON

## 2015-01-08 LAB — GLUCOSE, CAPILLARY
GLUCOSE-CAPILLARY: 128 mg/dL — AB (ref 65–99)
GLUCOSE-CAPILLARY: 169 mg/dL — AB (ref 65–99)
GLUCOSE-CAPILLARY: 306 mg/dL — AB (ref 65–99)

## 2015-01-08 LAB — MAGNESIUM: MAGNESIUM: 1.9 mg/dL (ref 1.7–2.4)

## 2015-01-08 LAB — ETHANOL

## 2015-01-08 LAB — COMPREHENSIVE METABOLIC PANEL
ALT: 729 U/L — AB (ref 17–63)
AST: 626 U/L — AB (ref 15–41)
Albumin: 4.1 g/dL (ref 3.5–5.0)
Alkaline Phosphatase: 73 U/L (ref 38–126)
Anion gap: 21 — ABNORMAL HIGH (ref 5–15)
BILIRUBIN TOTAL: 1.3 mg/dL — AB (ref 0.3–1.2)
BUN: 44 mg/dL — AB (ref 6–20)
CO2: 20 mmol/L — ABNORMAL LOW (ref 22–32)
Calcium: 8.8 mg/dL — ABNORMAL LOW (ref 8.9–10.3)
Chloride: 92 mmol/L — ABNORMAL LOW (ref 101–111)
Creatinine, Ser: 4.95 mg/dL — ABNORMAL HIGH (ref 0.61–1.24)
GFR calc Af Amer: 13 mL/min — ABNORMAL LOW (ref >60)
GFR, EST NON AFRICAN AMERICAN: 11 mL/min — AB (ref >60)
Glucose, Bld: 170 mg/dL — ABNORMAL HIGH (ref 65–99)
Potassium: 6.4 mmol/L (ref 3.5–5.1)
Sodium: 133 mmol/L — ABNORMAL LOW (ref 135–145)
TOTAL PROTEIN: 7 g/dL (ref 6.5–8.1)

## 2015-01-08 LAB — I-STAT CG4 LACTIC ACID, ED
LACTIC ACID, VENOUS: 8.53 mmol/L — AB (ref 0.5–2.0)
LACTIC ACID, VENOUS: 5.54 mmol/L — AB (ref 0.5–2.0)

## 2015-01-08 LAB — CBG MONITORING, ED: GLUCOSE-CAPILLARY: 159 mg/dL — AB (ref 65–99)

## 2015-01-08 LAB — BRAIN NATRIURETIC PEPTIDE: B Natriuretic Peptide: 495.3 pg/mL — ABNORMAL HIGH (ref 0.0–100.0)

## 2015-01-08 LAB — MRSA PCR SCREENING: MRSA by PCR: NEGATIVE

## 2015-01-08 LAB — LACTIC ACID, PLASMA: Lactic Acid, Venous: 9.3 mmol/L (ref 0.5–2.0)

## 2015-01-08 LAB — ABO/RH: ABO/RH(D): A POS

## 2015-01-08 SURGERY — LAPAROTOMY, EXPLORATORY
Anesthesia: General | Site: Abdomen

## 2015-01-08 MED ORDER — NOREPINEPHRINE BITARTRATE 1 MG/ML IV SOLN
2.0000 ug/min | INTRAVENOUS | Status: DC
  Administered 2015-01-08 (×2): 50 ug/min via INTRAVENOUS
  Administered 2015-01-09: 20 ug/min via INTRAVENOUS
  Administered 2015-01-10: 12 ug/min via INTRAVENOUS
  Administered 2015-01-10: 20 ug/min via INTRAVENOUS
  Administered 2015-01-11: 6.5 ug/min via INTRAVENOUS
  Filled 2015-01-08 (×6): qty 16

## 2015-01-08 MED ORDER — EPINEPHRINE HCL 0.1 MG/ML IJ SOSY
PREFILLED_SYRINGE | INTRAMUSCULAR | Status: DC | PRN
  Administered 2015-01-08: 2000 ug via INTRAVENOUS
  Administered 2015-01-08 (×2): 1000 ug via INTRAVENOUS
  Administered 2015-01-08: 100 ug via INTRAVENOUS

## 2015-01-08 MED ORDER — ANTISEPTIC ORAL RINSE SOLUTION (CORINZ)
7.0000 mL | Freq: Four times a day (QID) | OROMUCOSAL | Status: DC
  Administered 2015-01-09 – 2015-01-10 (×5): 7 mL via OROMUCOSAL

## 2015-01-08 MED ORDER — MIDAZOLAM HCL 2 MG/2ML IJ SOLN
INTRAMUSCULAR | Status: AC
  Filled 2015-01-08: qty 2

## 2015-01-08 MED ORDER — SODIUM CHLORIDE 0.9 % IV SOLN
0.0300 [IU]/min | INTRAVENOUS | Status: DC
  Administered 2015-01-08 – 2015-01-11 (×4): 0.03 [IU]/min via INTRAVENOUS
  Filled 2015-01-08 (×4): qty 2

## 2015-01-08 MED ORDER — SODIUM CHLORIDE 0.9 % IV BOLUS (SEPSIS)
1000.0000 mL | Freq: Once | INTRAVENOUS | Status: AC
  Administered 2015-01-08: 1000 mL via INTRAVENOUS

## 2015-01-08 MED ORDER — SODIUM BICARBONATE 8.4 % IV SOLN
INTRAVENOUS | Status: DC
  Administered 2015-01-08 (×2): via INTRAVENOUS
  Filled 2015-01-08 (×3): qty 150

## 2015-01-08 MED ORDER — DEXTROSE 5 % IV SOLN: 500.0000 mg | INTRAVENOUS | Status: DC

## 2015-01-08 MED ORDER — PIPERACILLIN-TAZOBACTAM 3.375 G IVPB
3.3750 g | Freq: Three times a day (TID) | INTRAVENOUS | Status: DC
  Administered 2015-01-08 – 2015-01-09 (×2): 3.375 g via INTRAVENOUS
  Filled 2015-01-08 (×4): qty 50

## 2015-01-08 MED ORDER — SODIUM BICARBONATE 8.4 % IV SOLN
INTRAVENOUS | Status: AC | PRN
  Administered 2015-01-08 (×2): 50 meq via INTRAVENOUS

## 2015-01-08 MED ORDER — MAGNESIUM SULFATE 50 % IJ SOLN
INTRAMUSCULAR | Status: AC | PRN
  Administered 2015-01-08: 2 g via INTRAVENOUS

## 2015-01-08 MED ORDER — ROCURONIUM BROMIDE 100 MG/10ML IV SOLN
INTRAVENOUS | Status: DC | PRN
  Administered 2015-01-08: 50 mg via INTRAVENOUS

## 2015-01-08 MED ORDER — LACTATED RINGERS IV SOLN
INTRAVENOUS | Status: DC | PRN
  Administered 2015-01-08: 14:00:00 via INTRAVENOUS

## 2015-01-08 MED ORDER — SODIUM CHLORIDE 0.9 % IV SOLN
1.0000 g | Freq: Once | INTRAVENOUS | Status: AC
  Administered 2015-01-08: 1 g via INTRAVENOUS
  Filled 2015-01-08: qty 10

## 2015-01-08 MED ORDER — EPINEPHRINE HCL 1 MG/ML IJ SOLN
4000.0000 ug | INTRAVENOUS | Status: DC | PRN
  Administered 2015-01-08: 3 ug/min via INTRAVENOUS

## 2015-01-08 MED ORDER — FENTANYL CITRATE (PF) 100 MCG/2ML IJ SOLN
INTRAMUSCULAR | Status: DC | PRN
  Administered 2015-01-08: 50 ug via INTRAVENOUS

## 2015-01-08 MED ORDER — ROCURONIUM BROMIDE 50 MG/5ML IV SOLN
INTRAVENOUS | Status: AC
  Filled 2015-01-08: qty 2

## 2015-01-08 MED ORDER — AMIODARONE HCL IN DEXTROSE 360-4.14 MG/200ML-% IV SOLN
60.0000 mg/h | INTRAVENOUS | Status: AC
  Administered 2015-01-08: 60 mg/h via INTRAVENOUS

## 2015-01-08 MED ORDER — EPINEPHRINE HCL 0.1 MG/ML IJ SOSY
PREFILLED_SYRINGE | INTRAMUSCULAR | Status: AC | PRN
  Administered 2015-01-08 (×2): 1 mg via INTRAVENOUS

## 2015-01-08 MED ORDER — FENTANYL CITRATE (PF) 250 MCG/5ML IJ SOLN
INTRAMUSCULAR | Status: AC
  Filled 2015-01-08: qty 5

## 2015-01-08 MED ORDER — PIPERACILLIN-TAZOBACTAM 3.375 G IVPB 30 MIN: 3.3750 g | Freq: Once | INTRAVENOUS | Status: DC

## 2015-01-08 MED ORDER — DEXTROSE 5 % IV SOLN
1.0000 g | Freq: Once | INTRAVENOUS | Status: AC
  Administered 2015-01-08: 1 g via INTRAVENOUS
  Filled 2015-01-08: qty 10

## 2015-01-08 MED ORDER — PANTOPRAZOLE SODIUM 40 MG IV SOLR
40.0000 mg | Freq: Every day | INTRAVENOUS | Status: DC
  Administered 2015-01-08 – 2015-01-21 (×14): 40 mg via INTRAVENOUS
  Filled 2015-01-08 (×14): qty 40

## 2015-01-08 MED ORDER — DEXTROSE 5 % IV SOLN
500.0000 mg | Freq: Once | INTRAVENOUS | Status: AC
  Administered 2015-01-08: 500 mg via INTRAVENOUS
  Filled 2015-01-08: qty 500

## 2015-01-08 MED ORDER — 0.9 % SODIUM CHLORIDE (POUR BTL) OPTIME
TOPICAL | Status: DC | PRN
  Administered 2015-01-08 (×2): 1000 mL

## 2015-01-08 MED ORDER — DEXTROSE 5 % IV SOLN: 1.0000 g | INTRAVENOUS | Status: DC

## 2015-01-08 MED ORDER — DEXTROSE 50 % IV SOLN
INTRAVENOUS | Status: DC | PRN
  Administered 2015-01-08: .5 via INTRAVENOUS

## 2015-01-08 MED ORDER — LIDOCAINE HCL (CARDIAC) 20 MG/ML IV SOLN
INTRAVENOUS | Status: AC
  Filled 2015-01-08: qty 5

## 2015-01-08 MED ORDER — INSULIN ASPART 100 UNIT/ML ~~LOC~~ SOLN
0.0000 [IU] | SUBCUTANEOUS | Status: DC
  Administered 2015-01-08: 5 [IU] via SUBCUTANEOUS
  Administered 2015-01-08: 7 [IU] via SUBCUTANEOUS
  Administered 2015-01-09 – 2015-01-11 (×9): 1 [IU] via SUBCUTANEOUS
  Administered 2015-01-16: 2 [IU] via SUBCUTANEOUS
  Administered 2015-01-17 – 2015-01-18 (×4): 1 [IU] via SUBCUTANEOUS

## 2015-01-08 MED ORDER — NALOXONE HCL 0.4 MG/ML IJ SOLN
0.4000 mg | Freq: Once | INTRAMUSCULAR | Status: DC
  Filled 2015-01-08: qty 1

## 2015-01-08 MED ORDER — CALCIUM CHLORIDE 10 % IV SOLN
INTRAVENOUS | Status: AC | PRN
  Administered 2015-01-08: 1 g via INTRAVENOUS

## 2015-01-08 MED ORDER — SODIUM CHLORIDE 0.9 % IV SOLN
INTRAVENOUS | Status: DC
  Administered 2015-01-09: 23:00:00 via INTRAVENOUS

## 2015-01-08 MED ORDER — ETOMIDATE 2 MG/ML IV SOLN
INTRAVENOUS | Status: AC | PRN
  Administered 2015-01-08: 8 mg via INTRAVENOUS
  Administered 2015-01-08: 10 mg via INTRAVENOUS
  Administered 2015-01-08: 30 mg via INTRAVENOUS

## 2015-01-08 MED ORDER — MAGNESIUM SULFATE 2 GM/50ML IV SOLN
2.0000 g | INTRAVENOUS | Status: AC
  Administered 2015-01-08: 2 g via INTRAVENOUS
  Filled 2015-01-08: qty 50

## 2015-01-08 MED ORDER — HEPARIN (PORCINE) IN NACL 100-0.45 UNIT/ML-% IJ SOLN
2100.0000 [IU]/h | INTRAMUSCULAR | Status: DC
  Administered 2015-01-08: 1750 [IU]/h via INTRAVENOUS
  Administered 2015-01-09: 2100 [IU]/h via INTRAVENOUS
  Filled 2015-01-08 (×3): qty 250

## 2015-01-08 MED ORDER — ONDANSETRON HCL 4 MG/2ML IJ SOLN
4.0000 mg | Freq: Once | INTRAMUSCULAR | Status: AC
  Administered 2015-01-08: 4 mg via INTRAVENOUS

## 2015-01-08 MED ORDER — LIDOCAINE HCL (CARDIAC) 20 MG/ML IV SOLN
INTRAVENOUS | Status: DC
Start: 2015-01-08 — End: 2015-01-08
  Filled 2015-01-08: qty 5

## 2015-01-08 MED ORDER — VANCOMYCIN HCL 10 G IV SOLR: 1750.0000 mg | INTRAVENOUS | Status: DC

## 2015-01-08 MED ORDER — VANCOMYCIN HCL 10 G IV SOLR
2500.0000 mg | Freq: Once | INTRAVENOUS | Status: AC
  Administered 2015-01-08: 2500 mg via INTRAVENOUS
  Filled 2015-01-08: qty 2500

## 2015-01-08 MED ORDER — ETOMIDATE 2 MG/ML IV SOLN
INTRAVENOUS | Status: AC
  Filled 2015-01-08: qty 20

## 2015-01-08 MED ORDER — ATROPINE SULFATE 0.1 MG/ML IJ SOLN
INTRAMUSCULAR | Status: AC
  Administered 2015-01-08: 1 mg
  Filled 2015-01-08: qty 10

## 2015-01-08 MED ORDER — FENTANYL CITRATE (PF) 100 MCG/2ML IJ SOLN
INTRAMUSCULAR | Status: AC
  Administered 2015-01-08: 100 ug via INTRAVENOUS
  Filled 2015-01-08: qty 2

## 2015-01-08 MED ORDER — CHLORHEXIDINE GLUCONATE 0.12% ORAL RINSE (MEDLINE KIT)
15.0000 mL | Freq: Two times a day (BID) | OROMUCOSAL | Status: DC
  Administered 2015-01-08 – 2015-01-09 (×3): 15 mL via OROMUCOSAL

## 2015-01-08 MED ORDER — DEXTROSE 5 % IV SOLN
0.5000 ug/min | INTRAVENOUS | Status: DC
  Administered 2015-01-08: 10 ug/min via INTRAVENOUS
  Filled 2015-01-08 (×4): qty 4

## 2015-01-08 MED ORDER — SODIUM CHLORIDE 0.9 % IV BOLUS (SEPSIS)
1000.0000 mL | INTRAVENOUS | Status: AC
  Administered 2015-01-08 (×3): 1000 mL via INTRAVENOUS

## 2015-01-08 MED ORDER — ATROPINE SULFATE 1 MG/ML IJ SOLN
INTRAMUSCULAR | Status: AC | PRN
  Administered 2015-01-08 (×2): 1 mg via INTRAVENOUS

## 2015-01-08 MED ORDER — SUCCINYLCHOLINE CHLORIDE 20 MG/ML IJ SOLN
INTRAMUSCULAR | Status: AC
  Filled 2015-01-08: qty 1

## 2015-01-08 MED ORDER — FENTANYL CITRATE (PF) 100 MCG/2ML IJ SOLN
25.0000 ug | INTRAMUSCULAR | Status: DC | PRN
  Administered 2015-01-08 – 2015-01-09 (×5): 100 ug via INTRAVENOUS
  Filled 2015-01-08 (×4): qty 2

## 2015-01-08 MED ORDER — ONDANSETRON HCL 4 MG/2ML IJ SOLN
INTRAMUSCULAR | Status: AC
  Filled 2015-01-08: qty 2

## 2015-01-08 MED ORDER — DEXTROSE 5 % IV SOLN
2.0000 ug/min | INTRAVENOUS | Status: DC
  Administered 2015-01-08: 5 ug/min via INTRAVENOUS
  Filled 2015-01-08 (×2): qty 4

## 2015-01-08 MED ORDER — SODIUM BICARBONATE 8.4 % IV SOLN
INTRAVENOUS | Status: AC | PRN
  Administered 2015-01-08: 50 meq via INTRAVENOUS

## 2015-01-08 MED ORDER — SODIUM CHLORIDE 0.9 % IV SOLN
Freq: Once | INTRAVENOUS | Status: AC
  Administered 2015-01-08: 10 mL/h via INTRAVENOUS

## 2015-01-08 MED ORDER — SODIUM BICARBONATE 8.4 % IV SOLN
INTRAVENOUS | Status: DC | PRN
  Administered 2015-01-08 (×2): 50 meq via INTRAVENOUS

## 2015-01-08 MED ORDER — SODIUM CHLORIDE 0.9 % IV SOLN
250.0000 mL | INTRAVENOUS | Status: DC | PRN
  Administered 2015-01-13 – 2015-02-08 (×3): 250 mL via INTRAVENOUS

## 2015-01-08 MED ORDER — ALBUMIN HUMAN 5 % IV SOLN
INTRAVENOUS | Status: DC | PRN
  Administered 2015-01-08: 15:00:00 via INTRAVENOUS

## 2015-01-08 MED ORDER — CALCIUM CHLORIDE 10 % IV SOLN
INTRAVENOUS | Status: DC | PRN
  Administered 2015-01-08: 100 mg via INTRAVENOUS

## 2015-01-08 MED ORDER — SODIUM BICARBONATE 8.4 % IV SOLN
50.0000 meq | Freq: Once | INTRAVENOUS | Status: AC
  Administered 2015-01-08: 50 meq via INTRAVENOUS

## 2015-01-08 MED ORDER — SODIUM BICARBONATE 8.4 % IV SOLN
INTRAVENOUS | Status: AC
  Filled 2015-01-08: qty 50

## 2015-01-08 MED ORDER — ATROPINE SULFATE 0.1 MG/ML IJ SOLN
INTRAMUSCULAR | Status: AC
  Filled 2015-01-08: qty 10

## 2015-01-08 MED ORDER — ROCURONIUM BROMIDE 50 MG/5ML IV SOLN
INTRAVENOUS | Status: AC | PRN
  Administered 2015-01-08: 50 mg via INTRAVENOUS

## 2015-01-08 MED ORDER — AMIODARONE HCL IN DEXTROSE 360-4.14 MG/200ML-% IV SOLN
30.0000 mg/h | INTRAVENOUS | Status: DC
  Administered 2015-01-08 – 2015-01-10 (×4): 30 mg/h via INTRAVENOUS
  Filled 2015-01-08 (×11): qty 200

## 2015-01-08 MED ORDER — DEXTROSE 5 % IV SOLN
150.0000 mg | INTRAVENOUS | Status: AC | PRN
  Administered 2015-01-08 (×2): 150 mg via INTRAVENOUS

## 2015-01-08 MED ORDER — NOREPINEPHRINE BITARTRATE 1 MG/ML IV SOLN
4000.0000 ug | INTRAVENOUS | Status: DC | PRN
  Administered 2015-01-08: 30 ug/min via INTRAVENOUS

## 2015-01-08 SURGICAL SUPPLY — 51 items
BLADE SURG ROTATE 9660 (MISCELLANEOUS) ×2 IMPLANT
CANISTER SUCTION 2500CC (MISCELLANEOUS) ×3 IMPLANT
CANISTER WOUND CARE 500ML ATS (WOUND CARE) ×2 IMPLANT
CHLORAPREP W/TINT 26ML (MISCELLANEOUS) ×3 IMPLANT
COVER MAYO STAND STRL (DRAPES) IMPLANT
COVER SURGICAL LIGHT HANDLE (MISCELLANEOUS) ×3 IMPLANT
DRAPE LAPAROSCOPIC ABDOMINAL (DRAPES) ×3 IMPLANT
DRAPE PROXIMA HALF (DRAPES) IMPLANT
DRAPE UTILITY XL STRL (DRAPES) ×6 IMPLANT
DRAPE WARM FLUID 44X44 (DRAPE) ×3 IMPLANT
DRSG OPSITE POSTOP 4X10 (GAUZE/BANDAGES/DRESSINGS) IMPLANT
DRSG OPSITE POSTOP 4X8 (GAUZE/BANDAGES/DRESSINGS) IMPLANT
ELECT BLADE 6.5 EXT (BLADE) ×2 IMPLANT
ELECT CAUTERY BLADE 6.4 (BLADE) ×6 IMPLANT
ELECT REM PT RETURN 9FT ADLT (ELECTROSURGICAL) ×3
ELECTRODE REM PT RTRN 9FT ADLT (ELECTROSURGICAL) ×1 IMPLANT
GLOVE BIO SURGEON STRL SZ7 (GLOVE) ×5 IMPLANT
GLOVE BIOGEL PI IND STRL 6.5 (GLOVE) IMPLANT
GLOVE BIOGEL PI IND STRL 7.0 (GLOVE) IMPLANT
GLOVE BIOGEL PI IND STRL 7.5 (GLOVE) ×1 IMPLANT
GLOVE BIOGEL PI INDICATOR 6.5 (GLOVE) ×4
GLOVE BIOGEL PI INDICATOR 7.0 (GLOVE) ×2
GLOVE BIOGEL PI INDICATOR 7.5 (GLOVE) ×2
GLOVE ECLIPSE 6.5 STRL STRAW (GLOVE) ×2 IMPLANT
GLOVE SURG SS PI 6.5 STRL IVOR (GLOVE) ×2 IMPLANT
GLOVE SURG SS PI 8.0 STRL IVOR (GLOVE) ×2 IMPLANT
GOWN STRL REUS W/ TWL LRG LVL3 (GOWN DISPOSABLE) ×3 IMPLANT
GOWN STRL REUS W/TWL LRG LVL3 (GOWN DISPOSABLE) ×15
KIT BASIN OR (CUSTOM PROCEDURE TRAY) ×3 IMPLANT
KIT ROOM TURNOVER OR (KITS) ×3 IMPLANT
LIGASURE IMPACT 36 18CM CVD LR (INSTRUMENTS) IMPLANT
NS IRRIG 1000ML POUR BTL (IV SOLUTION) ×6 IMPLANT
PACK GENERAL/GYN (CUSTOM PROCEDURE TRAY) ×3 IMPLANT
PAD ARMBOARD 7.5X6 YLW CONV (MISCELLANEOUS) ×3 IMPLANT
PENCIL BUTTON HOLSTER BLD 10FT (ELECTRODE) ×2 IMPLANT
SPECIMEN JAR LARGE (MISCELLANEOUS) IMPLANT
SPONGE ABDOMINAL VAC ABTHERA (MISCELLANEOUS) ×2 IMPLANT
SPONGE LAP 18X18 X RAY DECT (DISPOSABLE) IMPLANT
STAPLER VISISTAT 35W (STAPLE) ×3 IMPLANT
SUCTION POOLE TIP (SUCTIONS) ×3 IMPLANT
SUT PDS AB 1 TP1 96 (SUTURE) ×2 IMPLANT
SUT SILK 2 0 SH CR/8 (SUTURE) ×3 IMPLANT
SUT SILK 2 0 TIES 10X30 (SUTURE) ×1 IMPLANT
SUT SILK 3 0 SH CR/8 (SUTURE) ×3 IMPLANT
SUT SILK 3 0 TIES 10X30 (SUTURE) ×1 IMPLANT
SUT VIC AB 3-0 SH 18 (SUTURE) IMPLANT
TOWEL OR 17X26 10 PK STRL BLUE (TOWEL DISPOSABLE) ×3 IMPLANT
TRAY FOLEY CATH 16FRSI W/METER (SET/KITS/TRAYS/PACK) ×2 IMPLANT
TUBE CONNECTING 12'X1/4 (SUCTIONS)
TUBE CONNECTING 12X1/4 (SUCTIONS) IMPLANT
YANKAUER SUCT BULB TIP NO VENT (SUCTIONS) ×2 IMPLANT

## 2015-01-08 NOTE — Progress Notes (Signed)
Chaplain responded to referral from unit secretary that pt's family member needed emotional support.  Chaplain advocated for spouse to come back to room and facilitated update from MD.  Chaplain also provided emotional support at bedside and got pt's son and daughter in law to room.  Family supporting each other at bedside when chaplain left.  Chaplain available to follow up if/as needed.    01/08/15 1300  Clinical Encounter Type  Visited With Patient and family together;Health care provider  Visit Type ED  Referral From Nurse  Spiritual Encounters  Spiritual Needs Emotional;Grief support  Stress Factors  Patient Stress Factors Exhausted;Health changes  Family Stress Factors Family relationships;Health changes   Blain Pais 01/08/2015

## 2015-01-08 NOTE — ED Notes (Signed)
Attempting arterial line.

## 2015-01-08 NOTE — ED Notes (Signed)
Pt appears more mottled than on arrival.  C/o  Burning chest pain.  Dr Criss Alvine notified and EKG given.

## 2015-01-08 NOTE — ED Notes (Signed)
Critical care at bedside  

## 2015-01-08 NOTE — ED Notes (Signed)
Pulses returned 85.

## 2015-01-08 NOTE — Sedation Documentation (Signed)
shockable rhythm v fib. 1 shock 200 j.

## 2015-01-08 NOTE — ED Notes (Signed)
Dr Criss Alvine aware of bp.

## 2015-01-08 NOTE — Progress Notes (Signed)
ANTIBIOTIC CONSULT NOTE - INITIAL  Pharmacy Consult for vancomycin and Zosyn Indication: r/o sepsis  Allergies  Allergen Reactions  . Morphine And Related Anaphylaxis and Shortness Of Breath  . Neurontin [Gabapentin] Other (See Comments)    delusional  . Wellbutrin [Bupropion]     Patient Measurements:   Weight: 123.7 Height: 6'2.5"  Vital Signs: Temp: 97.4 F (36.3 C) (08/18 1041) Temp Source: Axillary (08/18 1041) BP: 63/48 mmHg (08/18 1300) Pulse Rate: 34 (08/18 1200) Intake/Output from previous day:   Intake/Output from this shift:    Labs:  Recent Labs  01/08/15 1100 01/08/15 1156  WBC 20.5*  --   HGB 17.1* 18.7*  PLT 186  --   CREATININE 4.95* 4.40*   Estimated Creatinine Clearance: 24.8 mL/min (by C-G formula based on Cr of 4.4). No results for input(s): VANCOTROUGH, VANCOPEAK, VANCORANDOM, GENTTROUGH, GENTPEAK, GENTRANDOM, TOBRATROUGH, TOBRAPEAK, TOBRARND, AMIKACINPEAK, AMIKACINTROU, AMIKACIN in the last 72 hours.   Microbiology: Recent Results (from the past 720 hour(s))  Blood Culture (routine x 2)     Status: None (Preliminary result)   Collection Time: 01/08/15 12:11 PM  Result Value Ref Range Status   Specimen Description BLOOD LEFT ANTECUBITAL  Final   Special Requests BOTTLES DRAWN AEROBIC AND ANAEROBIC 5CC  Final   Culture PENDING  Incomplete   Report Status PENDING  Incomplete    Assessment: 47 YOM with history of HTN, HLD, pre-diabetes, chronic pain and depression- [facility] was called for unresponsive agonal breathing- patient improved in the field with CPAP and narcan. Suspected sepsis- WBC very elevated at 20.5, lactic acid elevated at 8.53.  He was given 1 time doses of ceftriaxone and azithromycin in the ED, now to broaden coverage to vancomycin and Zosyn.  SCr on 12/17/14 was 1.15, however BMET today with SCr of 4.95- normalized CrCl ~42mL/min, CrCl using AdjBW ~20-19mL/min  Blood culture sent, urine culture ordered.  Goal of Therapy:   Vancomycin trough level 15-20 mcg/ml  Plan:  -d/c CTX and azithromycin -vancomycin load with 2500 mg IV x1, then start 1750mg  IV q48h per obesity nomogram (next dose due 8/20 at 1300) -Zosyn 3.375g IV q8h EI -f/u c/s, clinical progression, renal function, trough at SS  Jared [name], PharmD, BCPS Clinical Pharmacist Pager: [phone] 01/08/2015 1:25 PM

## 2015-01-08 NOTE — Transfer of Care (Signed)
Immediate Anesthesia Transfer of Care Note  Patient: Jared [name]  Procedure(s) Performed: Procedure(s): EXPLORATORY LAPAROTOMY (N/A) APPLICATION OF WOUND VAC (N/A)  Patient Location: ICU  Anesthesia Type:General  Level of Consciousness: obtunded and Patient remains intubated per anesthesia plan  Airway & Oxygen Therapy: Patient remains intubated per anesthesia plan and Patient placed on Ventilator (see vital sign flow sheet for setting)  Post-op Assessment: Report given to RN and Post -op Vital signs reviewed and stable  Post vital signs: Reviewed and stable  Last Vitals:  Filed Vitals:   01/08/15 1416  BP:   Pulse:   Temp:   Resp: 25    Complications: No apparent anesthesia complications   Report given to ICU RN and all questions answered. Airway intact.    Minus Liberty CRNA

## 2015-01-08 NOTE — ED Notes (Signed)
Wasted 4 mg of versed with RN [place] gross.  Unable to waste versed in pyxis.  Called pharmacy and they stated to free text waste.

## 2015-01-08 NOTE — Progress Notes (Signed)
ANTIBIOTIC CONSULT NOTE - INITIAL  Pharmacy Consult for ceftriaxone and azithromycin Indication: CAP  Allergies  Allergen Reactions  . Morphine And Related Anaphylaxis and Shortness Of Breath  . Neurontin [Gabapentin] Other (See Comments)    delusional  . Wellbutrin [Bupropion]     Patient Measurements:   Weight: 123.7 Height: 6'2.5"  Vital Signs: Temp: 97.4 F (36.3 C) (08/18 1041) Temp Source: Axillary (08/18 1041) BP: 137/107 mmHg (08/18 1041) Pulse Rate: 68 (08/18 1041) Intake/Output from previous day:   Intake/Output from this shift:    Labs:  Recent Labs  01/08/15 1100  WBC 20.5*  HGB 17.1*  PLT 186   CrCl cannot be calculated (Patient has no serum creatinine result on file.). No results for input(s): VANCOTROUGH, VANCOPEAK, VANCORANDOM, GENTTROUGH, GENTPEAK, GENTRANDOM, TOBRATROUGH, TOBRAPEAK, TOBRARND, AMIKACINPEAK, AMIKACINTROU, AMIKACIN in the last 72 hours.   Microbiology: No results found for this or any previous visit (from the past 720 hour(s)).  Medical History: Past Medical History  Diagnosis Date  . Chest pain   . HTN (hypertension)   . Hyperlipemia   . DJD (degenerative joint disease)   . BPH (benign prostatic hypertrophy)   . Obesity   . OSA (obstructive sleep apnea)   . Hypercholesteremia   . Chronic back pain   . Anxiety   . Depression     Assessment: 56 YOM with history of HTN, HLD, pre-diabetes, chronic pain and depression- [facility] was called for unresponsive agonal breathing- patient improved in the field with CPAP and narcan. Suspected sepsis- WBC very elevated at 20.5, lactic acid elevated at 8.53.  SCr on 12/17/14 was 1.15.  Goal of Therapy:  proper dosing based on renal and hepatic function  Plan:  -ceftriaxone 1g IV q24h -azithromycin 500mg  IV q24h  Neither of these agents require adjustment for renal function. Pharmacy to sign off at this time.  Waris Rodger D. Cheo Selvey, PharmD, BCPS Clinical Pharmacist Pager:  [phone] 01/08/2015 11:32 AM

## 2015-01-08 NOTE — ED Notes (Signed)
Pharmacy called for meds.  

## 2015-01-08 NOTE — Op Note (Signed)
Preop diagnosis: Severe shock, acidosis, renal and hepatic failure. Probable ischemic bowel Postop diagnosis: Severe shock, acidosis, renal and hepatic failure. No sign of ischemic bowel Procedure performed: Exploratory laparotomy; placement of abdominal VAC Surgeon:Kale Dols K. Asst.: Dr. Chevis Pretty Anesthesia: Gen. endotracheal Indications: This is a 59 year old male who presented emergently to the hospital with severe chest back and abdominal pain. He was found to be severely acidotic with acute renal and hepatic failure. His abdomen was distended and diffusely tender. He has been managed by critical care medicine and they acidosis evaluate him for probable ischemic bowel. The patient has required CPR twice prior to surgery. I examined the patient and discussed situation with his family. Despite his grim prognosis, I offered them exploratory laparotomy as this would be his best chance if it was indeed ischemic bowel. They have consented to the procedure.  Description of procedure: The patient brought to the operating room and placed in a supine position on the operating table. He required several more minutes of CPR as well as medical resuscitation. We regained a pulse with blood pressure. His abdomen was shaved, prepped with Betadine and draped in sterile fashion.  A brief timeout was taken. We made a vertical midline incision. Dissection was carried quickly down to the fascia which was divided vertically. Went to the peritoneum. The patient has some free fluid within the abdomen. We quickly visually examined this the small bowel and colon. There is no sign of frankly ischemic or necrotic bowel. The patient has global hypoperfusion due to the norepinephrine but there are no data areas of bowel. We identified the ligament of Treitz and examine all the small bowel to the cecum. The superior normal. As we were examining it, the perfusion actually seemed to improve as the bowel became more pink. The colon  was examined. There is no sign of perforation or infection. The appendix appears normal. The rectum was normal. The stomach was also examined and was normal. The liver and spleen appear a bit darker than usual but this is likely due to his global hypoperfusion. The gallbladder shows no sign of infection or inflammation. We palpated the nasogastric tube within the stomach. We used the large intra-abdominal vacuum dressing sponge. This was inserted under the fascia. The elliptical sponge was placed in subcutaneous space tissue. This was also sealed with an occlusive drape and then placed to suction with a good seal. The patient's overall clinical status is slightly improved. He is transported back to the intensive care unit in critical condition.  Wilmon Arms. Corliss Skains, MD, [facility] Surgery  General/ Trauma Surgery  01/08/2015 3:09 PM

## 2015-01-08 NOTE — Sedation Documentation (Signed)
No pulse noted after intubation.  cpr initiated.

## 2015-01-08 NOTE — Progress Notes (Signed)
eLink Physician-Brief Progress Note Patient Name: Jared [name] DOB: [birth year] MRN: [id number]   Date of Service  01/08/2015  HPI/Events of Note  Patient waking up, remains intubated and ventilated and is s/p ex lap.  eICU Interventions  Will order Fentanyl IV Q 2 hours PRN pain.      Intervention Category Intermediate Interventions: Pain - evaluation and management  Doranne Schmutz Eugene 01/08/2015, 9:23 PM

## 2015-01-08 NOTE — Consult Note (Signed)
Jared [name] 1956-05-11  [id number].   Primary Care MD: Dr. Unk Pinto Requesting MD: Dr. Blanchard Mane Chief Complaint/Reason for Consult: abdominal pain HPI: This is a 53 white male with a history of back pain, HTN, obesity, who saw his PCP 3 days ago for irritability and issues related to his depression and anxiety.  He was otherwise in his normal state of healthy until he woke up this morning with severe abdominal pain.  The patient is altered right now with hypoxia waiting to be intubated.  No further history is obtainable.  Upon arrive to the ED, the patient was clearly septic with hypotension and very deranged labs.  CCM has evaluated him and placed a femoral line.  He has not been stable enough yet to get a CT scan.  He is currently being intubated.   ROS : unable to obtain due to critical illness.  History reviewed. No pertinent family history.  Past Medical History  Diagnosis Date  . Chest pain   . HTN (hypertension)   . Hyperlipemia   . DJD (degenerative joint disease)   . BPH (benign prostatic hypertrophy)   . Obesity   . OSA (obstructive sleep apnea)   . Hypercholesteremia   . Chronic back pain   . Anxiety   . Depression     Past Surgical History  Procedure Laterality Date  . Back surgery    . Prostatectomy    . Cervical spine surgery      Social History:  reports that he has quit smoking. He does not have any smokeless tobacco history on file. He reports that he does not drink alcohol or use illicit drugs.  Allergies:  Allergies  Allergen Reactions  . Morphine And Related Anaphylaxis and Shortness Of Breath  . Neurontin [Gabapentin] Other (See Comments)    delusional  . Wellbutrin [Bupropion]      (Not in a hospital admission)  Blood pressure 63/48, pulse 34, temperature 97.4 F (36.3 C), temperature source Axillary, resp. rate 25, SpO2 81 %. Physical Exam: General: patient is purple, critically ill, in distress secondary to pain HEENT: head  is normocephalic, atraumatic.  Sclera are noninjected.  PERRL.  Ears and nose without any masses or lesions.  Mouth is pink but dry Heart: bradycardia, but regular.  Normal s1,s2. No obvious murmurs, gallops, or rubs noted.  Palpable radial and pedal pulses bilaterally Lungs: some diffuse rhonchi noted.  Clearly in respiratory distress with diffuse full body cyanosis.  Currently getting intubated Abd: soft, diffuse tenderness, seems distended, absent BS, no masses, hernias, or organomegaly MS: all 4 extremities are symmetrical with significant cyanosis, but no clubbing, or edema. Skin: warm and dry with no masses, lesions, or rashes Psych: disoriented and currently being intubated     Results for orders placed or performed during the hospital encounter of 01/08/15 (from the past 48 hour(s))  CBG monitoring, ED     Status: Abnormal   Collection Time: 01/08/15 10:49 AM  Result Value Ref Range   Glucose-Capillary 159 (H) 65 - 99 mg/dL  I-Stat arterial blood gas, ED     Status: Abnormal   Collection Time: 01/08/15 10:56 AM  Result Value Ref Range   pH, Arterial 7.310 (L) 7.350 - 7.450   pCO2 arterial 32.7 (L) 35.0 - 45.0 mmHg   pO2, Arterial 281.0 (H) 80.0 - 100.0 mmHg   Bicarbonate 16.6 (L) 20.0 - 24.0 mEq/L   TCO2 18 0 - 100 mmol/L   O2 Saturation 100.0 %  Acid-base deficit 9.0 (H) 0.0 - 2.0 mmol/L   Patient temperature 97.4 F    Collection site RADIAL, ALLEN'S TEST ACCEPTABLE    Drawn by Operator    Sample type ARTERIAL   Comprehensive metabolic panel     Status: Abnormal   Collection Time: 01/08/15 11:00 AM  Result Value Ref Range   Sodium 133 (L) 135 - 145 mmol/L   Potassium 6.4 (HH) 3.5 - 5.1 mmol/L    Comment: NO VISIBLE HEMOLYSIS CRITICAL RESULT CALLED TO, READ BACK BY AND VERIFIED WITH: BRENDA MICHAELSON,RN AT 1207 01/08/15 BY ZBEECH.    Chloride 92 (L) 101 - 111 mmol/L   CO2 20 (L) 22 - 32 mmol/L   Glucose, Bld 170 (H) 65 - 99 mg/dL   BUN 44 (H) 6 - 20 mg/dL    Creatinine, Ser 4.95 (H) 0.61 - 1.24 mg/dL   Calcium 8.8 (L) 8.9 - 10.3 mg/dL   Total Protein 7.0 6.5 - 8.1 g/dL   Albumin 4.1 3.5 - 5.0 g/dL   AST 626 (H) 15 - 41 U/L   ALT 729 (H) 17 - 63 U/L   Alkaline Phosphatase 73 38 - 126 U/L   Total Bilirubin 1.3 (H) 0.3 - 1.2 mg/dL   GFR calc non Af Amer 11 (L) >60 mL/min   GFR calc Af Amer 13 (L) >60 mL/min    Comment: (NOTE) The eGFR has been calculated using the CKD EPI equation. This calculation has not been validated in all clinical situations. eGFR's persistently <60 mL/min signify possible Chronic Kidney Disease.    Anion gap 21 (H) 5 - 15  Ethanol     Status: None   Collection Time: 01/08/15 11:00 AM  Result Value Ref Range   Alcohol, Ethyl (B) <5 <5 mg/dL    Comment:        LOWEST DETECTABLE LIMIT FOR SERUM ALCOHOL IS 5 mg/dL FOR MEDICAL PURPOSES ONLY   Troponin I     Status: Abnormal   Collection Time: 01/08/15 11:00 AM  Result Value Ref Range   Troponin I 0.47 (H) <0.031 ng/mL    Comment:        PERSISTENTLY INCREASED TROPONIN VALUES IN THE RANGE OF 0.04-0.49 ng/mL CAN BE SEEN IN:       -UNSTABLE ANGINA       -CONGESTIVE HEART FAILURE       -MYOCARDITIS       -CHEST TRAUMA       -ARRYHTHMIAS       -LATE PRESENTING MYOCARDIAL INFARCTION       -COPD   CLINICAL FOLLOW-UP RECOMMENDED.   Brain natriuretic peptide     Status: Abnormal   Collection Time: 01/08/15 11:00 AM  Result Value Ref Range   B Natriuretic Peptide 495.3 (H) 0.0 - 100.0 pg/mL  CBC with Differential     Status: Abnormal   Collection Time: 01/08/15 11:00 AM  Result Value Ref Range   WBC 20.5 (H) 4.0 - 10.5 K/uL   RBC 5.43 4.22 - 5.81 MIL/uL   Hemoglobin 17.1 (H) 13.0 - 17.0 g/dL   HCT 48.8 39.0 - 52.0 %   MCV 89.9 78.0 - 100.0 fL   MCH 31.5 26.0 - 34.0 pg   MCHC 35.0 30.0 - 36.0 g/dL   RDW 12.8 11.5 - 15.5 %   Platelets 186 150 - 400 K/uL   Neutrophils Relative % 94 (H) 43 - 77 %   Neutro Abs 19.2 (H) 1.7 - 7.7 K/uL   Lymphocytes Relative  3  (L) 12 - 46 %   Lymphs Abs 0.6 (L) 0.7 - 4.0 K/uL   Monocytes Relative 3 3 - 12 %   Monocytes Absolute 0.7 0.1 - 1.0 K/uL   Eosinophils Relative 0 0 - 5 %   Eosinophils Absolute 0.0 0.0 - 0.7 K/uL   Basophils Relative 0 0 - 1 %   Basophils Absolute 0.0 0.0 - 0.1 K/uL  I-Stat CG4 Lactic Acid, ED     Status: Abnormal   Collection Time: 01/08/15 11:15 AM  Result Value Ref Range   Lactic Acid, Venous 8.53 (HH) 0.5 - 2.0 mmol/L   Comment NOTIFIED PHYSICIAN   I-stat Chem 8, ED     Status: Abnormal   Collection Time: 01/08/15 11:56 AM  Result Value Ref Range   Sodium 129 (L) 135 - 145 mmol/L   Potassium 6.1 (HH) 3.5 - 5.1 mmol/L   Chloride 94 (L) 101 - 111 mmol/L   BUN 51 (H) 6 - 20 mg/dL   Creatinine, Ser 4.40 (H) 0.61 - 1.24 mg/dL   Glucose, Bld 155 (H) 65 - 99 mg/dL   Calcium, Ion 1.00 (L) 1.13 - 1.30 mmol/L   TCO2 19 0 - 100 mmol/L   Hemoglobin 18.7 (H) 13.0 - 17.0 g/dL   HCT 55.0 (H) 39.0 - 52.0 %   Comment NOTIFIED PHYSICIAN   Blood Culture (routine x 2)     Status: None (Preliminary result)   Collection Time: 01/08/15 12:11 PM  Result Value Ref Range   Specimen Description BLOOD LEFT ANTECUBITAL    Special Requests BOTTLES DRAWN AEROBIC AND ANAEROBIC 5CC    Culture PENDING    Report Status PENDING    Dg Chest Port 1 View  01/08/2015   CLINICAL DATA:  Confusion and dyspnea since this morning.  EXAM: PORTABLE CHEST - 1 VIEW  COMPARISON:  05/16/2012.  FINDINGS: The heart is mildly enlarged but stable. The mediastinal and hilar contours are slightly prominent but unchanged. Mild vascular congestion but no edema, infiltrates or effusions. The bony thorax is intact.  IMPRESSION: Stable cardiac enlargement and mild vascular congestion. No pulmonary edema or pleural effusions.   Electronically Signed   By: Marijo Sanes M.D.   On: 01/08/2015 11:42       Assessment/Plan 1. Septic shock with abdominal pain -patient likely has major intra-abdominal process causing sepsis with lactic  acidosis and severe laboratory derangement.  Patient had a plain abdominal film that did not show free-air, but unable to tell much.  Given his severe acute abdominal pain and laboratory findings, it is concerning that the patient may have ischemic bowel.  We have had his CXR reviewed with radiology and feel that this is unlikely be an aortic dissection or AAA rupture as his hgb is stable as well.  We will plan for OR after we can stabilize the patient enough to come to the OR.  Check PT/INR, may need FFP if INR out.  Jiyan Walkowski E 01/08/2015, 1:37 PM Pager: [phone]

## 2015-01-08 NOTE — Sedation Documentation (Signed)
Hr 29.  Atropine.

## 2015-01-08 NOTE — Code Documentation (Signed)
PULSE CHECK PEA.

## 2015-01-08 NOTE — ED Provider Notes (Signed)
CSN: [id number]     Arrival date & time 01/08/15  1037 History   First MD Initiated Contact with Patient 01/08/15 1042     Chief Complaint  Patient presents with  . Respiratory Distress     (Consider location/radiation/quality/duration/timing/severity/associated sxs/prior Treatment) HPI  59 year old male presents after being found unresponsive by his wife. Went to bed normal last night. They do not sleep in the same bed. When she got up to wake him up at 9 AM she found him laying sideways on the bed with his feet hanging off of the bed. He was having agonal respirations. Initial O2 sat was in the 80s per EMS. Improved after being put on BiPAP and then further improved after being given Narcan. Was given a total of 2 mg Narcan and his mental status has improved although he is still altered. Patient has not had any complaints although wife does note that he had a cough since yesterday. Denies chest pain. History limited by respiratory distress.  Past Medical History  Diagnosis Date  . Chest pain   . HTN (hypertension)   . Hyperlipemia   . DJD (degenerative joint disease)   . BPH (benign prostatic hypertrophy)   . Obesity   . OSA (obstructive sleep apnea)   . Hypercholesteremia    Past Surgical History  Procedure Laterality Date  . Back surgery    . Prostatectomy     No family history on file. Social History  Substance Use Topics  . Smoking status: Former Games developer  . Smokeless tobacco: Not on file  . Alcohol Use: No     Comment: rarely    Review of Systems  Unable to perform ROS: Severe respiratory distress      Allergies  Morphine and related and Neurontin  Home Medications   Prior to Admission medications   Medication Sig Start Date End Date Taking? Authorizing Provider  albuterol (PROVENTIL HFA;VENTOLIN HFA) 108 (90 BASE) MCG/ACT inhaler Inhale 2 puffs into the lungs every 2 (two) hours as needed for wheezing or shortness of breath (cough). 12/17/14   Courtney  Forcucci, PA-C  ALPRAZolam Prudy Feeler) 1 MG tablet TAKE 1/2-1 TABLET BY MOUTH 3 TIMES DAILY 01/02/15   Quentin Mulling, PA-C  atorvastatin (LIPITOR) 40 MG tablet TAKE 1 TABLET BY MOUTH DAILY OR AS DIRECTED FOR CHOLESTEROL 10/08/14   Lucky Cowboy, MD  azelastine (ASTELIN) 0.1 % nasal spray 1 to 2 sprays each nostril every 12 hours Patient taking differently: Place 1-2 sprays into both nostrils 2 (two) times daily as needed for allergies. 1 to 2 sprays each nostril every 12 hours 10/07/13 10/08/14  Lucky Cowboy, MD  bisoprolol-hydrochlorothiazide [facility]) 10-6.25 MG per tablet Take 1 tablet by mouth daily.    Historical Provider, MD  Cholecalciferol (VITAMIN D-3) 5000 UNITS TABS Take 3 tablets by mouth daily.     Historical Provider, MD  citalopram (CELEXA) 40 MG tablet TAKE 1 TABLET BY MOUTH EVERY DAY FOR MOOD 09/11/14   Lucky Cowboy, MD  docusate sodium (COLACE) 100 MG capsule Take 200 mg by mouth at bedtime.    Historical Provider, MD  Flaxseed, Linseed, (FLAXSEED OIL PO) Take 1 tablet by mouth daily.      Historical Provider, MD  fluticasone (FLONASE) 50 MCG/ACT nasal spray USE 1 TO 2 SPRAYS IN [facility] ONCE DAILY 10/21/14   Lucky Cowboy, MD  losartan-hydrochlorothiazide [facility]) 100-25 MG per tablet TAKE 1 TABLET BY MOUTH EVERY DAY 09/11/14   Lucky Cowboy, MD  minoxidil (LONITEN) 10 MG tablet Take  1 tablet daily or as directed for BP Patient taking differently: Take 5 mg by mouth at bedtime.  03/23/14   Lucky Cowboy, MD  montelukast (SINGULAIR) 10 MG tablet TAKE 1 TABLET EVERY DAY Patient taking differently: TAKE 1 TABLET daily at bedtime as needed for allergies 06/16/14   Lucky Cowboy, MD  Omega-3 Fatty Acids (FISH OIL BURP-LESS PO) Take 1 tablet by mouth daily.      Historical Provider, MD  ondansetron (ZOFRAN) 8 MG tablet 1/2 to 1 tablet up to 3 x daily as needed for nausea. 07/15/13   Lucky Cowboy, MD  oxycodone (ROXICODONE) 30 MG immediate release tablet Take 60 mg by mouth.  01/06/15 02/05/15  Historical Provider, MD  oxycodone (ROXICODONE) 30 MG immediate release tablet Take 1/2 tablet twice daily, and 2 at nighttime 01/06/15   Historical Provider, MD  pregabalin (LYRICA) 50 MG capsule Take 1 to 3 capsules 1 hour before sleep 01/05/15 02/05/15  Lucky Cowboy, MD  pregabalin (LYRICA) 75 MG capsule Take 1 to 2 capsules 1 hour before sleep 01/05/15 02/05/15  Lucky Cowboy, MD  senna (SENOKOT) 8.6 MG TABS tablet Take 4 tablets by mouth at bedtime.    Historical Provider, MD  testosterone cypionate (DEPOTESTOTERONE CYPIONATE) 200 MG/ML injection Inject 2 cc IM every 2 weeks or as directed by a physician. 08/25/14   Lucky Cowboy, MD   BP 137/107 mmHg  Pulse 68  Temp(Src) 97.4 F (36.3 C) (Axillary)  Resp 26  SpO2 95% Physical Exam  Constitutional: He appears well-developed and well-nourished. He appears lethargic.  HENT:  Head: Normocephalic and atraumatic.  Right Ear: External ear normal.  Left Ear: External ear normal.  Nose: Nose normal.  Eyes: Pupils are equal, round, and reactive to light. Right eye exhibits no discharge. Left eye exhibits no discharge.  Neck: Neck supple.  Cardiovascular: Normal rate, regular rhythm, normal heart sounds and intact distal pulses.   Pulmonary/Chest: Effort normal.  Coarse breath sounds bilaterally  Abdominal: Soft. There is no tenderness.  Musculoskeletal: He exhibits edema (trace pitting edema bilaterally).  Neurological: He appears lethargic.  Skin: Skin is warm and dry. He is not diaphoretic.  Nursing note and vitals reviewed.   ED Course  Procedures (including critical care time) Labs Review Labs Reviewed  COMPREHENSIVE METABOLIC PANEL - Abnormal; Notable for the following:    Sodium 133 (*)    Potassium 6.4 (*)    Chloride 92 (*)    CO2 20 (*)    Glucose, Bld 170 (*)    BUN 44 (*)    Creatinine, Ser 4.95 (*)    Calcium 8.8 (*)    AST 626 (*)    ALT 729 (*)    Total Bilirubin 1.3 (*)    GFR calc non Af  Amer 11 (*)    GFR calc Af Amer 13 (*)    Anion gap 21 (*)    All other components within normal limits  TROPONIN I - Abnormal; Notable for the following:    Troponin I 0.47 (*)    All other components within normal limits  BRAIN NATRIURETIC PEPTIDE - Abnormal; Notable for the following:    B Natriuretic Peptide 495.3 (*)    All other components within normal limits  CBC WITH DIFFERENTIAL/PLATELET - Abnormal; Notable for the following:    WBC 20.5 (*)    Hemoglobin 17.1 (*)    Neutrophils Relative % 94 (*)    Neutro Abs 19.2 (*)    Lymphocytes Relative 3 (*)  Lymphs Abs 0.6 (*)    All other components within normal limits  PROTIME-INR - Abnormal; Notable for the following:    Prothrombin Time 18.5 (*)    INR 1.54 (*)    All other components within normal limits  TROPONIN I - Abnormal; Notable for the following:    Troponin I 1.13 (*)    All other components within normal limits  GLUCOSE, CAPILLARY - Abnormal; Notable for the following:    Glucose-Capillary 128 (*)    All other components within normal limits  CBG MONITORING, ED - Abnormal; Notable for the following:    Glucose-Capillary 159 (*)    All other components within normal limits  I-STAT ARTERIAL BLOOD GAS, ED - Abnormal; Notable for the following:    pH, Arterial 7.310 (*)    pCO2 arterial 32.7 (*)    pO2, Arterial 281.0 (*)    Bicarbonate 16.6 (*)    Acid-base deficit 9.0 (*)    All other components within normal limits  I-STAT CG4 LACTIC ACID, ED - Abnormal; Notable for the following:    Lactic Acid, Venous 8.53 (*)    All other components within normal limits  I-STAT CHEM 8, ED - Abnormal; Notable for the following:    Sodium 129 (*)    Potassium 6.1 (*)    Chloride 94 (*)    BUN 51 (*)    Creatinine, Ser 4.40 (*)    Glucose, Bld 155 (*)    Calcium, Ion 1.00 (*)    Hemoglobin 18.7 (*)    HCT 55.0 (*)    All other components within normal limits  I-STAT CG4 LACTIC ACID, ED - Abnormal; Notable for the  following:    Lactic Acid, Venous 5.54 (*)    All other components within normal limits  I-STAT CHEM 8, ED - Abnormal; Notable for the following:    Sodium 123 (*)    Potassium 6.6 (*)    Chloride 91 (*)    BUN 46 (*)    Creatinine, Ser 3.70 (*)    Glucose, Bld 444 (*)    Calcium, Ion 0.68 (*)    All other components within normal limits  POCT I-STAT 7, (LYTES, BLD GAS, ICA,H+H) - Abnormal; Notable for the following:    pH, Arterial 7.045 (*)    pCO2 arterial 58.6 (*)    pO2, Arterial 71.0 (*)    Bicarbonate 16.0 (*)    Acid-base deficit 15.0 (*)    All other components within normal limits  CULTURE, BLOOD (ROUTINE X 2)  CULTURE, BLOOD (ROUTINE X 2)  URINE CULTURE  MRSA PCR SCREENING  ETHANOL  MAGNESIUM  URINALYSIS, ROUTINE W REFLEX MICROSCOPIC (NOT AT [facility])  URINE RAPID DRUG SCREEN, HOSP PERFORMED  LACTIC ACID, PLASMA  LACTIC ACID, PLASMA  TROPONIN I  TROPONIN I  BASIC METABOLIC PANEL  PREPARE FRESH FROZEN PLASMA  TYPE AND SCREEN    Imaging Review Dg Chest Port 1 View  01/08/2015   CLINICAL DATA:  Shortness of breath, cyanosis and abdominal pain. Femoral line placement.  EXAM: PORTABLE CHEST - 1 VIEW  COMPARISON:  01/08/2015 and 05/16/2012.  FINDINGS: 1328 hr. Two views obtained semi erect. Cardiomegaly appears unchanged. There is mild atelectasis at both lung bases. No evidence of pneumoperitoneum, pneumothorax or significant pleural effusion. Postsurgical changes are present status post lower cervical fusion. Telemetry leads overlie the chest.  IMPRESSION: Stable cardiomegaly and bibasilar atelectasis.  No new findings.   Electronically Signed   By: Carey Bullocks  M.D.   On: 01/08/2015 13:50   Dg Chest Port 1 View  01/08/2015   CLINICAL DATA:  Confusion and dyspnea since this morning.  EXAM: PORTABLE CHEST - 1 VIEW  COMPARISON:  05/16/2012.  FINDINGS: The heart is mildly enlarged but stable. The mediastinal and hilar contours are slightly prominent but unchanged. Mild  vascular congestion but no edema, infiltrates or effusions. The bony thorax is intact.  IMPRESSION: Stable cardiac enlargement and mild vascular congestion. No pulmonary edema or pleural effusions.   Electronically Signed   By: Rudie Meyer M.D.   On: 01/08/2015 11:42   Dg Abd Portable 1v  01/08/2015   CLINICAL DATA:  Abdominal pain  EXAM: PORTABLE ABDOMEN - 1 VIEW  COMPARISON:  CT abdomen and pelvis April 25, 2012  FINDINGS: There is moderate stool in the colon. No obstruction is appreciable on this supine examination. No free air is seen on this supine examination. There is postoperative change at L5 and S1. There is a right femoral catheter with the tip overlying the mid sacrum. Catheter tip is likely in the right external iliac vein region.  IMPRESSION: Right frontal region catheter with tip overlying the mid right sacrum. No obstruction or free air is seen on this supine examination. Note that evaluation for free air is limited on supine only examination.   Electronically Signed   By: Bretta Bang III M.D.   On: 01/08/2015 13:42   I have personally reviewed and evaluated these images and lab results as part of my medical decision-making.   EKG Interpretation   Date/Time:  Thursday January 08 2015 10:41:14 EDT Ventricular Rate:  59 PR Interval:    QRS Duration: 110 QT Interval:  433 QTC Calculation: 429 R Axis:   62 Text Interpretation:  Atrial fibrillation ST elevation appears similar to  2013 no reciprocal changes to suggest STEMI Confirmed by Coralynn Gaona  MD,  Mikka Kissner ([id number]) on 01/08/2015 10:48:13 AM      CRITICAL CARE Performed by: Pricilla Loveless T   Total critical care time: 75 minutes  Critical care time was exclusive of separately billable procedures and treating other patients.  Critical care was necessary to treat or prevent imminent or life-threatening deterioration.  Critical care was time spent personally by me on the following activities: development of treatment  plan with patient and/or surrogate as well as nursing, discussions with consultants, evaluation of patient's response to treatment, examination of patient, obtaining history from patient or surrogate, ordering and performing treatments and interventions, ordering and review of laboratory studies, ordering and review of radiographic studies, pulse oximetry and re-evaluation of patient's condition.   Cardiopulmonary Resuscitation (CPR) Procedure Note Directed/Performed by: Audree Camel I personally directed ancillary staff and/or performed CPR in an effort to regain return of spontaneous circulation and to maintain cardiac, neuro and systemic perfusion.   MDM   Final diagnoses:  Abdominal pain  Renal failure    11:08 AM Reviewed the patient's EKG with cardiologist on-call, Dr. Excell Seltzer. He agrees that the ST segments appear concave and that this does not appear to be an ischemic EKG. Will not call a code STEMI at this time. His symptoms are not consistent with acute coronary syndrome at this time.  11:33 AM Patient's lactic acid is severely elevated at 8.5. Now hypotensive on automatic cuff readings. Has had cough, will treat with IV antibiotics, fluids per sepsis protocol. Code sepsis called.   Patient progressively worsened while in the emergency department. His shortness of breath symptomatically resolved  but he was still tachypneic. He became progressively more cyanotic with a poor oxygen saturation waveform and was placed on a nonrebreather. Critical care was consulted early in the process given his ill appearance. He was maintaining his airway and thus was not intubated right away given his shock state and concern for intubation with such a low blood pressure. He was resuscitated with IV fluids and levophed. Critical care place a femoral central line and pressors were switched to this. Unable to cannulate an arterial line. He began to complain of abdominal pain and chest burning. Too unstable  at this time even on high dose levophed to take to CT scanner to rule out dissection or other acute cause. He was intubated by critical care with peri-intubation arrest. He developed ventricular tachycardia and V. fib which were both shocked and started on amiodarone. He did regain pulses. This time surgery had our to been consulted and is prepared to take the patient to the operating room for concern for perforation of his abdomen versus dead gut from lactic acidosis and shock state. His antibiotics were broadened out given his severe disease. Taken to OR in critical condition.   Pricilla Loveless, MD 01/08/15 [phone]

## 2015-01-08 NOTE — Procedures (Signed)
Endotracheal Intubation  Diagnoses/Indication: 1. Acute hypoxic respiratory failure 2. Septic shock with multisystem organ failure  Medications Administered: 1. Etomidate 40 mg IV push 2. Rocuronium 100 mg IV push following etomidate 3. Atropine 1 mg IV 4. Sodium bicarbonate 1 ampule IV bolus push  Description of Procedure: Rapid sequence intubation was performed with etomidate followed by rocuronium. GlideScope with #4 MacIntosh blade was inserted into the oropharynx and vocal cords were visualized with cricoid pressure. 8.0 endotracheal tube was unable to be passed between the vocal cords. Patient was then bagged with jaw thrust briefly.  GlideScope with #4 MacIntosh blade was re-inserted into the posterior pharynx with excellent view of vocal cords. A 7.0 endotracheal tube was passed between the vocal cords with ease and secured at 26 cm at the teeth. Patient had capnography color change 3 and auscultation of bilateral breath sounds by emergency room physician at bedside.

## 2015-01-08 NOTE — H&P (Signed)
PULMONARY / CRITICAL CARE MEDICINE   Name: Jared [name] MRN: [id number] DOB: 1956-01-17    ADMISSION DATE:  01/08/2015 CONSULTATION DATE:  8/18  REFERRING MD :  EDP Dr Criss Alvine  CHIEF COMPLAINT:  Found down  INITIAL PRESENTATION:  59 year old male with chronic back pain presented 8/18 after being found down at home. Responsive to narcan via EMS. In ED was profoundly hypotensive with elevated WBC, refractory to volume. In ER noted to have lactate 8.5, hyperkalemia and new started on pressors in ED. PCCM to admit.   STUDIES:    SIGNIFICANT EVENTS:    HISTORY OF PRESENT ILLNESS:  59 year old male with chronic back pain found down at home. Responsive to narcan via EMS. In ED was profoundly hypotensive with elevated WBC, refractory to volume. LA 8.5, started on pressors in ED. PCCM to admit.   Of note pt saw his PCP 3 days prior to admit with palpitations, agitation, irritable.    PAST MEDICAL HISTORY :   has a past medical history of Chest pain; HTN (hypertension); Hyperlipemia; DJD (degenerative joint disease); BPH (benign prostatic hypertrophy); Obesity; OSA (obstructive sleep apnea); Hypercholesteremia; Chronic back pain; Anxiety; and Depression.  has past surgical history that includes Back surgery; Prostatectomy; and Cervical spine surgery. Prior to Admission medications   Medication Sig Start Date End Date Taking? Authorizing Provider  albuterol (PROVENTIL HFA;VENTOLIN HFA) 108 (90 BASE) MCG/ACT inhaler Inhale 2 puffs into the lungs every 2 (two) hours as needed for wheezing or shortness of breath (cough). 12/17/14   Courtney Forcucci, PA-C  ALPRAZolam Prudy Feeler) 1 MG tablet TAKE 1/2-1 TABLET BY MOUTH 3 TIMES DAILY 01/02/15   Quentin Mulling, PA-C  atorvastatin (LIPITOR) 40 MG tablet TAKE 1 TABLET BY MOUTH DAILY OR AS DIRECTED FOR CHOLESTEROL 10/08/14   Lucky Cowboy, MD  azelastine (ASTELIN) 0.1 % nasal spray 1 to 2 sprays each nostril every 12 hours Patient taking differently: Place  1-2 sprays into both nostrils 2 (two) times daily as needed for allergies. 1 to 2 sprays each nostril every 12 hours 10/07/13 10/08/14  Lucky Cowboy, MD  bisoprolol-hydrochlorothiazide [facility]) 10-6.25 MG per tablet Take 1 tablet by mouth daily.    Historical Provider, MD  Cholecalciferol (VITAMIN D-3) 5000 UNITS TABS Take 3 tablets by mouth daily.     Historical Provider, MD  citalopram (CELEXA) 40 MG tablet TAKE 1 TABLET BY MOUTH EVERY DAY FOR MOOD 09/11/14   Lucky Cowboy, MD  docusate sodium (COLACE) 100 MG capsule Take 200 mg by mouth at bedtime.    Historical Provider, MD  Flaxseed, Linseed, (FLAXSEED OIL PO) Take 1 tablet by mouth daily.      Historical Provider, MD  fluticasone (FLONASE) 50 MCG/ACT nasal spray USE 1 TO 2 SPRAYS IN EACHNOSTRIL ONCE DAILY 10/21/14   Lucky Cowboy, MD  losartan-hydrochlorothiazide [facility]) 100-25 MG per tablet TAKE 1 TABLET BY MOUTH EVERY DAY 09/11/14   Lucky Cowboy, MD  minoxidil (LONITEN) 10 MG tablet Take 1 tablet daily or as directed for BP Patient taking differently: Take 5 mg by mouth at bedtime.  03/23/14   Lucky Cowboy, MD  montelukast (SINGULAIR) 10 MG tablet TAKE 1 TABLET EVERY DAY Patient taking differently: TAKE 1 TABLET daily at bedtime as needed for allergies 06/16/14   Lucky Cowboy, MD  Omega-3 Fatty Acids (FISH OIL BURP-LESS PO) Take 1 tablet by mouth daily.      Historical Provider, MD  ondansetron (ZOFRAN) 8 MG tablet 1/2 to 1 tablet up to 3  x daily as needed for nausea. 07/15/13   Lucky Cowboy, MD  oxycodone (ROXICODONE) 30 MG immediate release tablet Take 60 mg by mouth. 01/06/15 02/05/15  Historical Provider, MD  oxycodone (ROXICODONE) 30 MG immediate release tablet Take 1/2 tablet twice daily, and 2 at nighttime 01/06/15   Historical Provider, MD  pregabalin (LYRICA) 50 MG capsule Take 1 to 3 capsules 1 hour before sleep 01/05/15 02/05/15  Lucky Cowboy, MD  pregabalin (LYRICA) 75 MG capsule Take 1 to 2 capsules 1 hour before sleep  01/05/15 02/05/15  Lucky Cowboy, MD  senna (SENOKOT) 8.6 MG TABS tablet Take 4 tablets by mouth at bedtime.    Historical Provider, MD  testosterone cypionate (DEPOTESTOTERONE CYPIONATE) 200 MG/ML injection Inject 2 cc IM every 2 weeks or as directed by a physician. 08/25/14   Lucky Cowboy, MD   Allergies  Allergen Reactions  . Morphine And Related Anaphylaxis and Shortness Of Breath  . Neurontin [Gabapentin] Other (See Comments)    delusional  . Wellbutrin [Bupropion]     FAMILY HISTORY:  has no family status information on file.  SOCIAL HISTORY:  reports that he has quit smoking. He does not have any smokeless tobacco history on file. He reports that he does not drink alcohol or use illicit drugs.  REVIEW OF SYSTEMS:  Difficult to obtain.   SUBJECTIVE:   VITAL SIGNS: Temp:  [97.4 F (36.3 C)] 97.4 F (36.3 C) (08/18 1041) Pulse Rate:  [34-68] 34 (08/18 1200) Resp:  [18-33] 25 (08/18 1300) BP: (58-137)/(24-107) 63/48 mmHg (08/18 1300) SpO2:  [81 %-95 %] 81 % (08/18 1200) FiO2 (%):  [60 %] 60 % (08/18 1047) HEMODYNAMICS:   VENTILATOR SETTINGS: Vent Mode:  [-] BIPAP FiO2 (%):  [60 %] 60 % Set Rate:  [10 bmp] 10 bmp PEEP:  [5 cmH20] 5 cmH20 INTAKE / OUTPUT: No intake or output data in the 24 hours ending 01/08/15 1350  PHYSICAL EXAMINATION: General:  Critically ill appearing male seen in ER, severe cyanosis Neuro:  Somnolent but awake, confused, moaning HEENT:  Mm moist, no jvd  Cardiovascular:  s1s2 bradycardic 30's  Lungs:  resps even, labored, coarse  Abdomen:  Round, tender, distended Musculoskeletal:  Cool, mottled, shocky, pronounced cyanosis  LABS:  CBC  Recent Labs Lab 01/08/15 1100 01/08/15 1156  WBC 20.5*  --   HGB 17.1* 18.7*  HCT 48.8 55.0*  PLT 186  --    Coag's No results for input(s): APTT, INR in the last 168 hours. BMET  Recent Labs Lab 01/08/15 1100 01/08/15 1156  NA 133* 129*  K 6.4* 6.1*  CL 92* 94*  CO2 20*  --   BUN 44*  51*  CREATININE 4.95* 4.40*  GLUCOSE 170* 155*   Electrolytes  Recent Labs Lab 01/08/15 1100  CALCIUM 8.8*   Sepsis Markers  Recent Labs Lab 01/08/15 1115  LATICACIDVEN 8.53*   ABG  Recent Labs Lab 01/08/15 1056  PHART 7.310*  PCO2ART 32.7*  PO2ART 281.0*   Liver Enzymes  Recent Labs Lab 01/08/15 1100  AST 626*  ALT 729*  ALKPHOS 73  BILITOT 1.3*  ALBUMIN 4.1   Cardiac Enzymes  Recent Labs Lab 01/08/15 1100  TROPONINI 0.47*   Glucose  Recent Labs Lab 01/08/15 1049  GLUCAP 159*    Imaging Dg Chest Port 1 View  01/08/2015   CLINICAL DATA:  Confusion and dyspnea since this morning.  EXAM: PORTABLE CHEST - 1 VIEW  COMPARISON:  05/16/2012.  FINDINGS: The heart is  mildly enlarged but stable. The mediastinal and hilar contours are slightly prominent but unchanged. Mild vascular congestion but no edema, infiltrates or effusions. The bony thorax is intact.  IMPRESSION: Stable cardiac enlargement and mild vascular congestion. No pulmonary edema or pleural effusions.   Electronically Signed   By: Rudie Meyer M.D.   On: 01/08/2015 11:42   Dg Abd Portable 1v  01/08/2015   CLINICAL DATA:  Abdominal pain  EXAM: PORTABLE ABDOMEN - 1 VIEW  COMPARISON:  CT abdomen and pelvis April 25, 2012  FINDINGS: There is moderate stool in the colon. No obstruction is appreciable on this supine examination. No free air is seen on this supine examination. There is postoperative change at L5 and S1. There is a right femoral catheter with the tip overlying the mid sacrum. Catheter tip is likely in the right external iliac vein region.  IMPRESSION: Right frontal region catheter with tip overlying the mid right sacrum. No obstruction or free air is seen on this supine examination. Note that evaluation for free air is limited on supine only examination.   Electronically Signed   By: Bretta Bang III M.D.   On: 01/08/2015 13:42     ASSESSMENT / PLAN:  PULMONARY OETT 8/18>>> Acute  respiratory failure  P:   Intubate  Vent support - 8cc/kg  F/u CXR  F/u ABG   CARDIOVASCULAR CVL R fem 8/18>>> Severe shock - septic  Elevated troponin  Bradycardia - r/t hyperkalemia  P:  Pressors as needed to keep MAP >65 Volume  Trend troponin  F/u lactate, pct  Correct K  PRN atropine   RENAL AKI  Hyperkalemia  Hyponatremia  P:   Volume as above  K treated in ER - HCO3, calcium  Stat f/u chem, mg, phos   GASTROINTESTINAL abd pain  Presumed acute abdomen  P:   Surgery following  Planned emergent exploratory lap  NG tube   HEMATOLOGIC No active issue  P:  F/u cbc  Stat INR   INFECTIOUS Septic shock  Presumed abd source  P:   BCx2 8/18>>> UC 8/18>>> Vanc 8/18>>> Zosyn 8/18>>>  ENDOCRINE Hyperglycemia  P:   SSI   NEUROLOGIC AMS - r/t shock, hypotension  Chronic pain  P:   RASS goal: -1 Intermittent sedation while on vent - will reassess post op    FAMILY  - Updates:  Wife and family updated 8/18 in ER.   - Inter-disciplinary family meet or Palliative Care meeting due by:  8/25     Dirk Dress, NP 01/08/2015  1:50 PM Pager: [phone] or [phone]

## 2015-01-08 NOTE — Consult Note (Addendum)
Cardiologist:  New Reason for Consult: Cardiac Arrest-   Jared [name] is an 59 y.o. male.  HPI:   The patient is a 59 yo male with a history of HTN, HLD, obesity, OSA, chronic back pain, BPH, DJD.   The patient woke this morning with severe abd pain.  He was found down at home and unresponsive by his wife.   He was started on Bipap and given IV narcan.    He was intubated and became pulseless.  CPR was initiated.  He was given IV epinephrine, atropine, and bicarbonate.  He went into VT and was given two boluses of amiodarone.  He then underwent exploratory laparotomy looking for a potential ischemic bowel.  No cause was found.    The patient is intubated and not responsive at this time. .     Past Medical History  Diagnosis Date  . Chest pain   . HTN (hypertension)   . Hyperlipemia   . DJD (degenerative joint disease)   . BPH (benign prostatic hypertrophy)   . Obesity   . OSA (obstructive sleep apnea)   . Hypercholesteremia   . Chronic back pain   . Anxiety   . Depression     Past Surgical History  Procedure Laterality Date  . Back surgery    . Prostatectomy    . Cervical spine surgery      History reviewed. No pertinent family history.  Social History:  reports that he has quit smoking. He does not have any smokeless tobacco history on file. He reports that he does not drink alcohol or use illicit drugs.  Allergies:  Allergies  Allergen Reactions  . Morphine And Related Anaphylaxis and Shortness Of Breath  . Neurontin [Gabapentin] Other (See Comments)    delusional  . Wellbutrin [Bupropion]     Medications:  Scheduled Meds: . atropine      . etomidate      . lidocaine (cardiac) 100 mg/71m      . magnesium sulfate 1 - 4 g bolus IVPB  2 g Intravenous STAT  . naLOXone (NARCAN)  injection  0.4 mg Intravenous Once  . pantoprazole (PROTONIX) IV  40 mg Intravenous QHS  . piperacillin-tazobactam  3.375 g Intravenous Once  . piperacillin-tazobactam (ZOSYN)  IV   3.375 g Intravenous 3 times per day  . rocuronium      . sodium bicarbonate      . succinylcholine      . [START ON 01/10/2015] vancomycin  1,750 mg Intravenous Q48H  . vancomycin  2,500 mg Intravenous Once   Continuous Infusions: . sodium chloride 75 mL/hr (01/08/15 1550)  . amiodarone    . amiodarone    . epinephrine 10 mcg/min (01/08/15 1548)  . norepinephrine (LEVOPHED) Adult infusion 40 mcg/min (01/08/15 1417)  .  sodium bicarbonate  infusion 1000 mL    . vasopressin (PITRESSIN) infusion - *FOR SHOCK*     PRN Meds:.sodium chloride   Results for orders placed or performed during the hospital encounter of 01/08/15 (from the past 48 hour(s))  CBG monitoring, ED     Status: Abnormal   Collection Time: 01/08/15 10:49 AM  Result Value Ref Range   Glucose-Capillary 159 (H) 65 - 99 mg/dL  I-Stat arterial blood gas, ED     Status: Abnormal   Collection Time: 01/08/15 10:56 AM  Result Value Ref Range   pH, Arterial 7.310 (L) 7.350 - 7.450   pCO2 arterial 32.7 (L) 35.0 - 45.0 mmHg  pO2, Arterial 281.0 (H) 80.0 - 100.0 mmHg   Bicarbonate 16.6 (L) 20.0 - 24.0 mEq/L   TCO2 18 0 - 100 mmol/L   O2 Saturation 100.0 %   Acid-base deficit 9.0 (H) 0.0 - 2.0 mmol/L   Patient temperature 97.4 F    Collection site RADIAL, ALLEN'S TEST ACCEPTABLE    Drawn by Operator    Sample type ARTERIAL   Comprehensive metabolic panel     Status: Abnormal   Collection Time: 01/08/15 11:00 AM  Result Value Ref Range   Sodium 133 (L) 135 - 145 mmol/L   Potassium 6.4 (HH) 3.5 - 5.1 mmol/L    Comment: NO VISIBLE HEMOLYSIS CRITICAL RESULT CALLED TO, READ BACK BY AND VERIFIED WITH: BRENDA MICHAELSON,RN AT 1207 01/08/15 BY ZBEECH.    Chloride 92 (L) 101 - 111 mmol/L   CO2 20 (L) 22 - 32 mmol/L   Glucose, Bld 170 (H) 65 - 99 mg/dL   BUN 44 (H) 6 - 20 mg/dL   Creatinine, Ser 4.95 (H) 0.61 - 1.24 mg/dL   Calcium 8.8 (L) 8.9 - 10.3 mg/dL   Total Protein 7.0 6.5 - 8.1 g/dL   Albumin 4.1 3.5 - 5.0 g/dL    AST 626 (H) 15 - 41 U/L   ALT 729 (H) 17 - 63 U/L   Alkaline Phosphatase 73 38 - 126 U/L   Total Bilirubin 1.3 (H) 0.3 - 1.2 mg/dL   GFR calc non Af Amer 11 (L) >60 mL/min   GFR calc Af Amer 13 (L) >60 mL/min    Comment: (NOTE) The eGFR has been calculated using the CKD EPI equation. This calculation has not been validated in all clinical situations. eGFR's persistently <60 mL/min signify possible Chronic Kidney Disease.    Anion gap 21 (H) 5 - 15  Ethanol     Status: None   Collection Time: 01/08/15 11:00 AM  Result Value Ref Range   Alcohol, Ethyl (B) <5 <5 mg/dL    Comment:        LOWEST DETECTABLE LIMIT FOR SERUM ALCOHOL IS 5 mg/dL FOR MEDICAL PURPOSES ONLY   Troponin I     Status: Abnormal   Collection Time: 01/08/15 11:00 AM  Result Value Ref Range   Troponin I 0.47 (H) <0.031 ng/mL    Comment:        PERSISTENTLY INCREASED TROPONIN VALUES IN THE RANGE OF 0.04-0.49 ng/mL CAN BE SEEN IN:       -UNSTABLE ANGINA       -CONGESTIVE HEART FAILURE       -MYOCARDITIS       -CHEST TRAUMA       -ARRYHTHMIAS       -LATE PRESENTING MYOCARDIAL INFARCTION       -COPD   CLINICAL FOLLOW-UP RECOMMENDED.   Brain natriuretic peptide     Status: Abnormal   Collection Time: 01/08/15 11:00 AM  Result Value Ref Range   B Natriuretic Peptide 495.3 (H) 0.0 - 100.0 pg/mL  CBC with Differential     Status: Abnormal   Collection Time: 01/08/15 11:00 AM  Result Value Ref Range   WBC 20.5 (H) 4.0 - 10.5 K/uL   RBC 5.43 4.22 - 5.81 MIL/uL   Hemoglobin 17.1 (H) 13.0 - 17.0 g/dL   HCT 48.8 39.0 - 52.0 %   MCV 89.9 78.0 - 100.0 fL   MCH 31.5 26.0 - 34.0 pg   MCHC 35.0 30.0 - 36.0 g/dL   RDW 12.8 11.5 - 15.5 %  Platelets 186 150 - 400 K/uL   Neutrophils Relative % 94 (H) 43 - 77 %   Neutro Abs 19.2 (H) 1.7 - 7.7 K/uL   Lymphocytes Relative 3 (L) 12 - 46 %   Lymphs Abs 0.6 (L) 0.7 - 4.0 K/uL   Monocytes Relative 3 3 - 12 %   Monocytes Absolute 0.7 0.1 - 1.0 K/uL   Eosinophils Relative  0 0 - 5 %   Eosinophils Absolute 0.0 0.0 - 0.7 K/uL   Basophils Relative 0 0 - 1 %   Basophils Absolute 0.0 0.0 - 0.1 K/uL  I-Stat CG4 Lactic Acid, ED     Status: Abnormal   Collection Time: 01/08/15 11:15 AM  Result Value Ref Range   Lactic Acid, Venous 8.53 (HH) 0.5 - 2.0 mmol/L   Comment NOTIFIED PHYSICIAN   I-stat Chem 8, ED     Status: Abnormal   Collection Time: 01/08/15 11:56 AM  Result Value Ref Range   Sodium 129 (L) 135 - 145 mmol/L   Potassium 6.1 (HH) 3.5 - 5.1 mmol/L   Chloride 94 (L) 101 - 111 mmol/L   BUN 51 (H) 6 - 20 mg/dL   Creatinine, Ser 4.40 (H) 0.61 - 1.24 mg/dL   Glucose, Bld 155 (H) 65 - 99 mg/dL   Calcium, Ion 1.00 (L) 1.13 - 1.30 mmol/L   TCO2 19 0 - 100 mmol/L   Hemoglobin 18.7 (H) 13.0 - 17.0 g/dL   HCT 55.0 (H) 39.0 - 52.0 %   Comment NOTIFIED PHYSICIAN   Blood Culture (routine x 2)     Status: None (Preliminary result)   Collection Time: 01/08/15 12:11 PM  Result Value Ref Range   Specimen Description BLOOD LEFT ANTECUBITAL    Special Requests BOTTLES DRAWN AEROBIC AND ANAEROBIC 5CC    Culture PENDING    Report Status PENDING   Magnesium     Status: None   Collection Time: 01/08/15  1:50 PM  Result Value Ref Range   Magnesium 1.9 1.7 - 2.4 mg/dL  Protime-INR     Status: Abnormal   Collection Time: 01/08/15  1:50 PM  Result Value Ref Range   Prothrombin Time 18.5 (H) 11.6 - 15.2 seconds   INR 1.54 (H) 0.00 - 1.49  Troponin I     Status: Abnormal   Collection Time: 01/08/15  1:50 PM  Result Value Ref Range   Troponin I 1.13 (HH) <0.031 ng/mL    Comment:        POSSIBLE MYOCARDIAL ISCHEMIA. SERIAL TESTING RECOMMENDED. CRITICAL RESULT CALLED TO, READ BACK BY AND VERIFIED WITH: LATANYA SATTERFIELD,RN AT 1519 01/08/15 BY ZBEECH.   I-Stat CG4 Lactic Acid, ED     Status: Abnormal   Collection Time: 01/08/15  1:56 PM  Result Value Ref Range   Lactic Acid, Venous 5.54 (HH) 0.5 - 2.0 mmol/L   Comment NOTIFIED PHYSICIAN   I-stat chem 8, ed      Status: Abnormal   Collection Time: 01/08/15  2:04 PM  Result Value Ref Range   Sodium 123 (L) 135 - 145 mmol/L   Potassium 6.6 (HH) 3.5 - 5.1 mmol/L   Chloride 91 (L) 101 - 111 mmol/L   BUN 46 (H) 6 - 20 mg/dL   Creatinine, Ser 3.70 (H) 0.61 - 1.24 mg/dL   Glucose, Bld 444 (H) 65 - 99 mg/dL   Calcium, Ion 0.68 (L) 1.13 - 1.30 mmol/L   TCO2 17 0 - 100 mmol/L   Hemoglobin 16.0 13.0 -  17.0 g/dL   HCT 47.0 39.0 - 52.0 %   Comment NOTIFIED PHYSICIAN   Prepare fresh frozen plasma     Status: None (Preliminary result)   Collection Time: 01/08/15  2:19 PM  Result Value Ref Range   Unit Number [id number]    Blood Component Type THW PLS APHR    Unit division A0    Status of Unit ALLOCATED    Transfusion Status OK TO TRANSFUSE    Unit Number [id number]    Blood Component Type THW PLS APHR    Unit division 00    Status of Unit ALLOCATED    Transfusion Status OK TO TRANSFUSE    Unit Number [id number]    Blood Component Type THW PLS APHR    Unit division 00    Status of Unit ALLOCATED    Transfusion Status OK TO TRANSFUSE    Unit Number [id number]    Blood Component Type THW PLS APHR    Unit division 00    Status of Unit ALLOCATED    Transfusion Status OK TO TRANSFUSE   I-STAT 7, (LYTES, BLD GAS, ICA, H+H)     Status: Abnormal   Collection Time: 01/08/15  2:35 PM  Result Value Ref Range   pH, Arterial 7.045 (LL) 7.350 - 7.450   pCO2 arterial 58.6 (HH) 35.0 - 45.0 mmHg   pO2, Arterial 71.0 (L) 80.0 - 100.0 mmHg   Bicarbonate 16.0 (L) 20.0 - 24.0 mEq/L   TCO2 18 0 - 100 mmol/L   O2 Saturation 84.0 %   Acid-base deficit 15.0 (H) 0.0 - 2.0 mmol/L   Sodium 135 135 - 145 mmol/L   Potassium 5.0 3.5 - 5.1 mmol/L   Calcium, Ion 1.26 1.13 - 1.30 mmol/L   HCT 44.0 39.0 - 52.0 %   Hemoglobin 15.0 13.0 - 17.0 g/dL   Sample type ARTERIAL   Glucose, capillary     Status: Abnormal   Collection Time: 01/08/15  2:52 PM  Result Value Ref Range   Glucose-Capillary 128 (H) 65 - 99  mg/dL    Dg Chest Port 1 View  01/08/2015   CLINICAL DATA:  Shortness of breath, cyanosis and abdominal pain. Femoral line placement.  EXAM: PORTABLE CHEST - 1 VIEW  COMPARISON:  01/08/2015 and 05/16/2012.  FINDINGS: 1328 hr. Two views obtained semi erect. Cardiomegaly appears unchanged. There is mild atelectasis at both lung bases. No evidence of pneumoperitoneum, pneumothorax or significant pleural effusion. Postsurgical changes are present status post lower cervical fusion. Telemetry leads overlie the chest.  IMPRESSION: Stable cardiomegaly and bibasilar atelectasis.  No new findings.   Electronically Signed   By: Richardean Sale M.D.   On: 01/08/2015 13:50   Dg Chest Port 1 View  01/08/2015   CLINICAL DATA:  Confusion and dyspnea since this morning.  EXAM: PORTABLE CHEST - 1 VIEW  COMPARISON:  05/16/2012.  FINDINGS: The heart is mildly enlarged but stable. The mediastinal and hilar contours are slightly prominent but unchanged. Mild vascular congestion but no edema, infiltrates or effusions. The bony thorax is intact.  IMPRESSION: Stable cardiac enlargement and mild vascular congestion. No pulmonary edema or pleural effusions.   Electronically Signed   By: Marijo Sanes M.D.   On: 01/08/2015 11:42   Dg Abd Portable 1v  01/08/2015   CLINICAL DATA:  Abdominal pain  EXAM: PORTABLE ABDOMEN - 1 VIEW  COMPARISON:  CT abdomen and pelvis April 25, 2012  FINDINGS: There is moderate stool in the colon. No obstruction  is appreciable on this supine examination. No free air is seen on this supine examination. There is postoperative change at L5 and S1. There is a right femoral catheter with the tip overlying the mid sacrum. Catheter tip is likely in the right external iliac vein region.  IMPRESSION: Right frontal region catheter with tip overlying the mid right sacrum. No obstruction or free air is seen on this supine examination. Note that evaluation for free air is limited on supine only examination.    Electronically Signed   By: Lowella Grip III M.D.   On: 01/08/2015 13:42    Review of Systems  Unable to perform ROS: intubated   Blood pressure 77/19, pulse 65, temperature 97.4 F (36.3 C), temperature source Axillary, resp. rate 25, SpO2 100 %. Physical Exam  Nursing note and vitals reviewed. Constitutional: He appears well-developed.  Obese Intubated.   HENT:  Head: Normocephalic and atraumatic.  Cardiovascular: Normal rate and regular rhythm.   Pulses:      Radial pulses are 2+ on the right side, and 2+ on the left side.       Dorsalis pedis pulses are 1+ on the right side, and 2+ on the left side.  Hr sounds obscured by lung sounds.    Respiratory:  On the vent.  Diffuse rhonchi  GI: Bowel sounds are normal. There is no tenderness.  Musculoskeletal:  Trace LEE   Neurological:  Intubated and sedated  Skin: Skin is warm and dry.    Assessment/Plan: Active Problems:   Septic shock   Acute respiratory failure with hypoxia   OSA   Obesity   Hyperkalemia   Acute renal failure   59 yo male with a history of HTN, HLD, obesity, OSA, chronic back pain, BPH, DJD.   The patient woke this morning with severe abd pain.  He was found down at home and unresponsive by his wife.  He required CPR once in the ER for PEA, and intubation.  He also required defibrillation and two boluses of IV amiodarone.   He was taken emergently to the OR for exploratory laparotomy due to concern for ischemic bowel.  None was seen.  His EKG shows a-fib or accelerated junctional rhythm and has inferior and lateral ST elevations without reciprocal changes.  He is currently in SR in the 70's on epinephrine and norepinephrine.   Lactic acid elevated.  On Zosyn and vancomycin.  Blood culture drawn.   Echocardiogram is in progress.   Initial troponin was 0.47 at 1100hrs and subsequent  1.13 at 1350hrs.  Cycling every 6 hours.  Repeat  K was 5.0.  CXR shows stable cardiomegaly and bibasilar atelectasis.   Will review echo for wall motion abnormalities or evidence of right heart strain from a possible PE.  His EF in 01/2011 was 50-55% with a moderately dilated LA and peak PA pressure of 56mHg. RA and RV were normal at that time.   SCr improving.  It was normal on 12/17/14.    HTarri Fuller POlivia Lopez de Gutierrez8/18/2016, 3:22 PM     I have examined the patient and reviewed assessment and plan and discussed with patient.  Agree with above as stated.  I personally reviewed the echo.  THe RV and RA are dialted.  THe RV is hypocontractile.  THis is consistent with PE.  No effusion.  Septal assynchrony, no RWMA.  Low normal LVEF. WOuld heparinize.  Hopefully renal function will improve to allow for assessment of pulmonary vasculature.  Patient critically ill.  Intubated, sedated.  No evidence of ascending or arch dissection by TTE.   Francys Bolin S.

## 2015-01-08 NOTE — Code Documentation (Signed)
No pulse.  Pt asystole.  2nd charge at 200jj

## 2015-01-08 NOTE — Procedures (Signed)
Central Venous Catheter Insertion Procedure Note Jared [name] [id number] 11-21-1955  Procedure: Insertion of Central Venous Catheter Indications: Drug and/or fluid administration  Procedure Details Consent: Unable to obtain consent because of emergent medical necessity. Time Out: Verified patient identification, verified procedure, site/side was marked, verified correct patient position, special equipment/implants available, medications/allergies/relevent history reviewed, required imaging and test results available.  Performed  Maximum sterile technique was used including antiseptics, cap, gloves, gown, hand hygiene, mask and sheet. Skin prep: Chlorhexidine; local anesthetic administered A antimicrobial bonded/coated triple lumen catheter was placed in the right femoral vein due to emergent situation using the Seldinger technique.  Evaluation Blood flow good Complications: No apparent complications Patient did tolerate procedure well.   Placed by Joneen Roach ACNP  Performed under direct MD supervision.  Performed using ultrasound guidance.  Wire visualized in vessel under ultrasound.

## 2015-01-08 NOTE — Progress Notes (Addendum)
Discussed post op with Dr. Corliss Skains - no ischemic bowel or perforation found.  Essentially normal exp lap with no obvious source of persistent shock.  Pt now in ICU.  Remains shocky on high dose levophed, epi gtt.  Family updated.   Broad ddx remains including MI, PE, severe sepsis although unclear source, aortic dissection (although less likely with stable hgb >16).   PLAN -  Stat echo  Stat f/u ABG  Cardiology to see  BLE venous dopplers  F/u lactate, pct, troponin, bmet  Add vasopressin Wean epi gtt to off first  HCO3 gtt  Stat renal u/s  Amiodarone gtt  Remains too unstable for CT scan   Addendum - discussed with Dr. Isabel Caprice and sonographer at bedside during echo.  Moderate R heart strain which does increase suspicion for PE. D/w Dr. Corliss Skains who is ok with anticoagulation.  Will order heparin gtt per pharmacy without bolus.    Additional cc time 45 minutes   Dirk Dress, NP 01/08/2015  4:05 PM Pager: [phone] or [phone]

## 2015-01-08 NOTE — ED Notes (Signed)
Dr. Tsuei at bedside.  

## 2015-01-08 NOTE — Progress Notes (Signed)
ANTICOAGULATION CONSULT NOTE - Initial Consult  Pharmacy Consult for heparin Indication: r/o pulmonary embolus  Allergies  Allergen Reactions  . Morphine And Related Anaphylaxis and Shortness Of Breath  . Neurontin [Gabapentin] Other (See Comments)    delusional  . Wellbutrin [Bupropion]     Patient Measurements:   Heparin Dosing Weight: 110.1kg  Vital Signs: Temp: 97.4 F (36.3 C) (08/18 1627) Temp Source: Oral (08/18 1627) BP: 100/53 mmHg (08/18 1600) Pulse Rate: 69 (08/18 1605)  Labs:  Recent Labs  01/08/15 1100 01/08/15 1156 01/08/15 1350 01/08/15 1404 01/08/15 1435  HGB 17.1* 18.7*  --  16.0 15.0  HCT 48.8 55.0*  --  47.0 44.0  PLT 186  --   --   --   --   LABPROT  --   --  18.5*  --   --   INR  --   --  1.54*  --   --   CREATININE 4.95* 4.40*  --  3.70*  --   TROPONINI 0.47*  --  1.13*  --   --     Estimated Creatinine Clearance: 29.5 mL/min (by C-G formula based on Cr of 3.7).   Medical History: Past Medical History  Diagnosis Date  . Chest pain   . HTN (hypertension)   . Hyperlipemia   . DJD (degenerative joint disease)   . BPH (benign prostatic hypertrophy)   . Obesity   . OSA (obstructive sleep apnea)   . Hypercholesteremia   . Chronic back pain   . Anxiety   . Depression     Assessment: 11 yom found down this AM, had severe abdominal pain. S/p abdominal surgery today and became bradycardic, pulseless post-intubation. No cause found on exploratory laparotomy. ECHO showed evidence of RHS, suspicion for PE. Pharmacy consulted to dose heparin with no bolus. CCM NP spoke with Dr. Corliss Skains - ok with anticoagulation post-surgery. CBC wnl. No bleed documented.  Goal of Therapy:  Heparin level 0.3-0.7 units/ml Monitor platelets by anticoagulation protocol: Yes   Plan:  Heparin IV @ 1750 units/h 8h HL Daily HL/CBC Mon s/sx bleeding   Babs Bertin, PharmD Clinical Pharmacist Pager [phone] 01/08/2015 4:36 PM

## 2015-01-08 NOTE — Progress Notes (Signed)
PT bagged to OR by RT, RN, MD, CRNA at bedside.

## 2015-01-08 NOTE — Progress Notes (Signed)
   01/08/15 1400  Clinical Encounter Type  Visited With Patient;Family;Health care provider  Visit Type Critical Care;Pre-op;Patient in surgery  Referral From Nurse  Consult/Referral To Chaplain  Spiritual Encounters  Spiritual Needs Emotional;Prayer;Grief support  Stress Factors  Patient Stress Factors Exhausted;Lack of knowledge;Health changes;Loss;Major life changes  Pt moved to 2M10;family located in 2S waiting area; [facility] offered emotional support to family and liaison with [facility] team as needed; [facility] follow-up recommended.

## 2015-01-08 NOTE — Anesthesia Postprocedure Evaluation (Signed)
  Anesthesia Post-op Note  Patient: Jared [name]  Procedure(s) Performed: Procedure(s): EXPLORATORY LAPAROTOMY (N/A) APPLICATION OF WOUND VAC (N/A)  Patient Location: ICU  Anesthesia Type:General  Level of Consciousness: unresponsive, obtunded and Patient remains intubated per anesthesia plan  Airway and Oxygen Therapy: Patient remains intubated per anesthesia plan and Patient placed on Ventilator (see vital sign flow sheet for setting)  Post-op Pain: none  Post-op Assessment: Post-op Vital signs reviewed, PATIENT'S CARDIOVASCULAR STATUS UNSTABLE and Respiratory Function Stable, although ventilated.              Post-op Vital Signs: Reviewed and unstable  Last Vitals:  Filed Vitals:   01/08/15 1730  BP: 103/74  Pulse: 70  Temp:   Resp: 25    Complications: patient presented in OR with CPR in progress. Remains gravely ill, requiring hemodynamic and ventilatory support.

## 2015-01-08 NOTE — ED Notes (Signed)
[facility] called for unresponsive agonal.  When [facility] arrived rr were 6-8, full of fluid, mottled/ashen.  Placed on CPAP and pt became combative.  Pupils were pinpoint, thus 2 mg narcan IV given.  Pt immediately responded with increased rr to 14 with minor improvement with loc.  cbg 173.  150/100, hr 70, spo2 95% - 100%.  Wife states he has been taking his potassium pills, but not lasix x 3 days, but he has been taking his narcotics for pain.

## 2015-01-08 NOTE — Anesthesia Preprocedure Evaluation (Addendum)
Anesthesia Evaluation  Patient identified by MRN, date of birth, ID band Patient unresponsive    Reviewed: Patient's Chart, lab work & pertinent test results, Unable to perform ROS - Chart review onlyPreop documentation limited or incomplete due to emergent nature of procedure.  Airway Mallampati: Intubated       Dental   Pulmonary sleep apnea , COPD COPD inhaler, former smoker,  breath sounds clear to auscultation        Cardiovascular hypertension, Pt. on medications  Hemodynamically unstable, with CPR in progress  '12 myoview: normal '12 ECHO: normal LVF and valves   Neuro/Psych Pupils fixed and dilated with CPR Chronic back and neck pain: narcotics    GI/Hepatic Acute abdomen with necrotic bowel   Endo/Other  Morbid obesity  Renal/GU ARFRenal disease (K+ 6.6, creat 3.70)     Musculoskeletal  (+) Arthritis -,   Abdominal (+) + obese,   Peds  Hematology   Anesthesia Other Findings   Reproductive/Obstetrics                          Anesthesia Physical Anesthesia Plan  ASA: V and emergent  Anesthesia Plan: General   Post-op Pain Management:    Induction:   Airway Management Planned: Oral ETT  Additional Equipment: Arterial line  Intra-op Plan:   Post-operative Plan: Post-operative intubation/ventilation  Informed Consent:   Plan Discussed with: CRNA and Surgeon  Anesthesia Plan Comments: (Plan routine monitors, GETA with A line  CPR, glucose and insulin for hyperkalemia)        Anesthesia Quick Evaluation

## 2015-01-08 NOTE — Progress Notes (Signed)
Echocardiogram 2D Echocardiogram has been performed.  Jared [name] 01/08/2015, 4:27 PM

## 2015-01-08 NOTE — Procedures (Signed)
Arterial Catheter Insertion Procedure Note Jared [name] [id number] 1955/08/15  Procedure: Insertion of Arterial Catheter  Indications: Blood pressure monitoring  Procedure Details Consent: Unable to obtain consent because of emergent medical necessity. Time Out: Verified patient identification, verified procedure, site/side was marked, verified correct patient position, special equipment/implants available, medications/allergies/relevent history reviewed, required imaging and test results available.  Performed Real time [place] was used to ID and cannulate the vessel  Maximum sterile technique was used including antiseptics, cap, gloves, gown, hand hygiene, mask and sheet. Skin prep: Chlorhexidine; local anesthetic administered 20 gauge catheter was inserted into left femoral artery using the Seldinger technique.  Evaluation Blood flow good; BP tracing good. Complications: No apparent complications.   Jared [name],PETE 01/08/2015

## 2015-01-08 NOTE — Progress Notes (Signed)
Arterial blood gas results given to [facility] [id number]/37/[id number]/15.3

## 2015-01-08 NOTE — Progress Notes (Signed)
   01/08/15 1400  Clinical Encounter Type  Visited With Patient;Family;Health care provider  Visit Type Critical Care;Pre-op;Patient in surgery  Referral From Nurse  Consult/Referral To Chaplain  Spiritual Encounters  Spiritual Needs Emotional;Prayer;Grief support  Stress Factors  Patient Stress Factors Exhausted;Lack of knowledge;Health changes;Loss;Major life changes  CH reffered by RN to follow up with family and pt.  Pt condition critical; Ch present when HC/MD informed family of medical status; escorted family to surgery waiting area per MD; CH offered spiritual support, prayer and emotional support to family; [facility] prayer offered at bedside with pt.  CH to follow-up as needed.

## 2015-01-08 NOTE — Progress Notes (Addendum)
eLink Physician-Brief Progress Note Patient Name: Jared [name] DOB: Oct 09, 1955 MRN: [id number]   Date of Service  01/08/2015  HPI/Events of Note  Multiple issues: 1. ABG on 100%/PRVC 25/TV 670/P 5 = 7.31/31.2/137/15.5. Patient is also on a NaHCO3 IV infusion and 2. Blood glucose = 3.06.   eICU Interventions  Will order: 1. Increase PEEP to 8. Attempt to wean FiO2. 2. Accuchecks Q 4 hours with sensitive Novolog SSI coverage.      Intervention Category Major Interventions: Respiratory failure - evaluation and management  Sommer,Steven Eugene 01/08/2015, 8:27 PM

## 2015-01-08 NOTE — Code Documentation (Signed)
Code Resuscitation Note: Post intubation the patient went bradycardic and lost pulse. ACLS algorithm was initiated. Patient was maintained on vasopressor support with increased norepinephrine. IV epinephrine, atropine, and bicarbonate were administered. Patient regained a pulse transiently with a rhythm of ventricular tachycardia. Defibrillation was administered and CPR was resumed. Patient received a bolus of 150 mg of IV amiodarone as well as IV magnesium sulfate, calcium chloride, and further sodium bicarbonate. Patient's rhythm was subsequently found to be pulseless electrical activity and resuscitation was continued. Patient regained spontaneous circulation but with further brief runs of wide complex tachycardia and additional 150 mg of IV amiodarone was administered. Family was allowed at bedside prior to transport to the operating room for emergent exploratory laparotomy.  Donna Christen Jamison Neighbor, M.D. [facility] Pager:  [phone] After 3pm or if no response, call [phone]

## 2015-01-09 ENCOUNTER — Inpatient Hospital Stay (HOSPITAL_COMMUNITY): Payer: Medicare Other

## 2015-01-09 ENCOUNTER — Encounter (HOSPITAL_COMMUNITY): Payer: Self-pay | Admitting: Surgery

## 2015-01-09 DIAGNOSIS — R7989 Other specified abnormal findings of blood chemistry: Secondary | ICD-10-CM

## 2015-01-09 DIAGNOSIS — R579 Shock, unspecified: Secondary | ICD-10-CM

## 2015-01-09 DIAGNOSIS — I2699 Other pulmonary embolism without acute cor pulmonale: Secondary | ICD-10-CM

## 2015-01-09 LAB — CBC
HCT: 48.9 % (ref 39.0–52.0)
HCT: 43.1 % (ref 39.0–52.0)
HEMOGLOBIN: 15.6 g/dL (ref 13.0–17.0)
Hemoglobin: 18 g/dL — ABNORMAL HIGH (ref 13.0–17.0)
MCH: 31.6 pg (ref 26.0–34.0)
MCH: 31.3 pg (ref 26.0–34.0)
MCHC: 36.8 g/dL — AB (ref 30.0–36.0)
MCHC: 36.2 g/dL — ABNORMAL HIGH (ref 30.0–36.0)
MCV: 85.8 fL (ref 78.0–100.0)
MCV: 86.5 fL (ref 78.0–100.0)
PLATELETS: 208 10*3/uL (ref 150–400)
PLATELETS: 141 10*3/uL — AB (ref 150–400)
RBC: 5.7 MIL/uL (ref 4.22–5.81)
RBC: 4.98 MIL/uL (ref 4.22–5.81)
RDW: 13.1 % (ref 11.5–15.5)
RDW: 13.3 % (ref 11.5–15.5)
WBC: 27.1 10*3/uL — ABNORMAL HIGH (ref 4.0–10.5)
WBC: 19.9 10*3/uL — AB (ref 4.0–10.5)

## 2015-01-09 LAB — GLUCOSE, CAPILLARY
GLUCOSE-CAPILLARY: 101 mg/dL — AB (ref 65–99)
GLUCOSE-CAPILLARY: 114 mg/dL — AB (ref 65–99)
GLUCOSE-CAPILLARY: 232 mg/dL — AB (ref 65–99)
GLUCOSE-CAPILLARY: 86 mg/dL (ref 65–99)
Glucose-Capillary: 107 mg/dL — ABNORMAL HIGH (ref 65–99)
Glucose-Capillary: 114 mg/dL — ABNORMAL HIGH (ref 65–99)
Glucose-Capillary: 121 mg/dL — ABNORMAL HIGH (ref 65–99)
Glucose-Capillary: 97 mg/dL (ref 65–99)

## 2015-01-09 LAB — BLOOD GAS, ARTERIAL
ACID-BASE DEFICIT: 3.1 mmol/L — AB (ref 0.0–2.0)
Acid-base deficit: 2 mmol/L (ref 0.0–2.0)
BICARBONATE: 21.7 meq/L (ref 20.0–24.0)
Bicarbonate: 21 mEq/L (ref 20.0–24.0)
DRAWN BY: 405301
Drawn by: 405301
FIO2: 0.7
FIO2: 0.8
MECHVT: 670 mL
O2 Saturation: 96.7 %
O2 Saturation: 97.3 %
PATIENT TEMPERATURE: 99.3
PCO2 ART: 32.4 mmHg — AB (ref 35.0–45.0)
PEEP/CPAP: 8 cmH2O
PEEP: 8 cmH2O
PH ART: 7.39 (ref 7.350–7.450)
PH ART: 7.437 (ref 7.350–7.450)
PO2 ART: 118 mmHg — AB (ref 80.0–100.0)
Patient temperature: 97.4
RATE: 25 resp/min
RATE: 25 resp/min
TCO2: 22.1 mmol/L (ref 0–100)
TCO2: 22.7 mmol/L (ref 0–100)
VT: 670 mL
pCO2 arterial: 35.6 mmHg (ref 35.0–45.0)
pO2, Arterial: 99.8 mmHg (ref 80.0–100.0)

## 2015-01-09 LAB — SODIUM, URINE, RANDOM: Sodium, Ur: 45 mmol/L

## 2015-01-09 LAB — POCT I-STAT 3, ART BLOOD GAS (G3+)
ACID-BASE DEFICIT: 2 mmol/L (ref 0.0–2.0)
BICARBONATE: 24 meq/L (ref 20.0–24.0)
O2 Saturation: 95 %
PO2 ART: 88 mmHg (ref 80.0–100.0)
TCO2: 25 mmol/L (ref 0–100)
pCO2 arterial: 45.3 mmHg — ABNORMAL HIGH (ref 35.0–45.0)
pH, Arterial: 7.339 — ABNORMAL LOW (ref 7.350–7.450)

## 2015-01-09 LAB — HEPATIC FUNCTION PANEL
ALBUMIN: 2.4 g/dL — AB (ref 3.5–5.0)
ALT: 4100 U/L — ABNORMAL HIGH (ref 17–63)
AST: 4557 U/L — AB (ref 15–41)
Alkaline Phosphatase: 83 U/L (ref 38–126)
BILIRUBIN TOTAL: 1.7 mg/dL — AB (ref 0.3–1.2)
Bilirubin, Direct: 0.7 mg/dL — ABNORMAL HIGH (ref 0.1–0.5)
Indirect Bilirubin: 1 mg/dL — ABNORMAL HIGH (ref 0.3–0.9)
TOTAL PROTEIN: 4.1 g/dL — AB (ref 6.5–8.1)

## 2015-01-09 LAB — BASIC METABOLIC PANEL
ANION GAP: 17 — AB (ref 5–15)
Anion gap: 16 — ABNORMAL HIGH (ref 5–15)
BUN: 48 mg/dL — ABNORMAL HIGH (ref 6–20)
BUN: 61 mg/dL — AB (ref 6–20)
CHLORIDE: 98 mmol/L — AB (ref 101–111)
CHLORIDE: 96 mmol/L — AB (ref 101–111)
CO2: 20 mmol/L — ABNORMAL LOW (ref 22–32)
CO2: 24 mmol/L (ref 22–32)
Calcium: 7.6 mg/dL — ABNORMAL LOW (ref 8.9–10.3)
Calcium: 7.1 mg/dL — ABNORMAL LOW (ref 8.9–10.3)
Creatinine, Ser: 5.34 mg/dL — ABNORMAL HIGH (ref 0.61–1.24)
Creatinine, Ser: 7.58 mg/dL — ABNORMAL HIGH (ref 0.61–1.24)
GFR, EST AFRICAN AMERICAN: 12 mL/min — AB (ref >60)
GFR, EST AFRICAN AMERICAN: 8 mL/min — AB (ref >60)
GFR, EST NON AFRICAN AMERICAN: 10 mL/min — AB (ref >60)
GFR, EST NON AFRICAN AMERICAN: 7 mL/min — AB (ref >60)
Glucose, Bld: 113 mg/dL — ABNORMAL HIGH (ref 65–99)
Glucose, Bld: 106 mg/dL — ABNORMAL HIGH (ref 65–99)
POTASSIUM: 3.8 mmol/L (ref 3.5–5.1)
POTASSIUM: 3.6 mmol/L (ref 3.5–5.1)
SODIUM: 134 mmol/L — AB (ref 135–145)
SODIUM: 137 mmol/L (ref 135–145)

## 2015-01-09 LAB — LACTIC ACID, PLASMA
LACTIC ACID, VENOUS: 1.7 mmol/L (ref 0.5–2.0)
Lactic Acid, Venous: 3.5 mmol/L (ref 0.5–2.0)

## 2015-01-09 LAB — PHOSPHORUS: PHOSPHORUS: 3.7 mg/dL (ref 2.5–4.6)

## 2015-01-09 LAB — MAGNESIUM: MAGNESIUM: 2.5 mg/dL — AB (ref 1.7–2.4)

## 2015-01-09 LAB — TROPONIN I
Troponin I: >65 ng/mL (ref <0.031)
Troponin I: >65 ng/mL (ref <0.031)
Troponin I: 49.6 ng/mL (ref <0.031)

## 2015-01-09 LAB — CREATININE, URINE, RANDOM: CREATININE, URINE: 196.36 mg/dL

## 2015-01-09 LAB — HEPARIN LEVEL (UNFRACTIONATED)
HEPARIN UNFRACTIONATED: 0.35 [IU]/mL (ref 0.30–0.70)
Heparin Unfractionated: 0.18 IU/mL — ABNORMAL LOW (ref 0.30–0.70)

## 2015-01-09 LAB — PROTIME-INR
INR: 2.24 — AB (ref 0.00–1.49)
PROTHROMBIN TIME: 24.6 s — AB (ref 11.6–15.2)

## 2015-01-09 LAB — PROCALCITONIN: PROCALCITONIN: 40.68 ng/mL

## 2015-01-09 MED ORDER — PIPERACILLIN-TAZOBACTAM IN DEX 2-0.25 GM/50ML IV SOLN
2.2500 g | Freq: Three times a day (TID) | INTRAVENOUS | Status: DC
  Administered 2015-01-09 – 2015-01-10 (×3): 2.25 g via INTRAVENOUS
  Filled 2015-01-09 (×6): qty 50

## 2015-01-09 MED ORDER — FENTANYL BOLUS VIA INFUSION
50.0000 ug | INTRAVENOUS | Status: DC | PRN
  Filled 2015-01-09: qty 50

## 2015-01-09 MED ORDER — SODIUM CHLORIDE 0.9 % IV SOLN
25.0000 ug/h | INTRAVENOUS | Status: DC
  Administered 2015-01-09: 50 ug/h via INTRAVENOUS
  Administered 2015-01-09 – 2015-01-11 (×5): 400 ug/h via INTRAVENOUS
  Administered 2015-01-11: 300 ug/h via INTRAVENOUS
  Administered 2015-01-11 – 2015-01-13 (×6): 400 ug/h via INTRAVENOUS
  Administered 2015-01-13 (×3): 50 ug/h via INTRAVENOUS
  Administered 2015-01-13: 400 ug/h via INTRAVENOUS
  Administered 2015-01-13: 50 ug/h via INTRAVENOUS
  Filled 2015-01-09 (×16): qty 50

## 2015-01-09 MED ORDER — FUROSEMIDE 10 MG/ML IJ SOLN
160.0000 mg | Freq: Four times a day (QID) | INTRAVENOUS | Status: DC
  Administered 2015-01-09 – 2015-01-10 (×4): 160 mg via INTRAVENOUS
  Filled 2015-01-09 (×7): qty 16

## 2015-01-09 MED ORDER — LORAZEPAM 2 MG/ML IJ SOLN
INTRAMUSCULAR | Status: AC
  Administered 2015-01-09: 2 mg via INTRAVENOUS
  Filled 2015-01-09: qty 1

## 2015-01-09 MED ORDER — LORAZEPAM 2 MG/ML IJ SOLN
1.0000 mg | INTRAMUSCULAR | Status: DC | PRN
  Administered 2015-01-09 – 2015-01-13 (×16): 2 mg via INTRAVENOUS
  Filled 2015-01-09 (×15): qty 1

## 2015-01-09 NOTE — Anesthesia Postprocedure Evaluation (Signed)
  Anesthesia Post-op Note  Patient: Jared [name]  Procedure(s) Performed: Procedure(s): EXPLORATORY LAPAROTOMY (N/A) APPLICATION OF WOUND VAC (N/A)  Patient Location: ICU  Anesthesia Type:General  Level of Consciousness: awake, alert , patient cooperative and Patient remains intubated per anesthesia plan  Airway and Oxygen Therapy: Patient remains intubated per anesthesia plan and Patient placed on Ventilator (see vital sign flow sheet for setting)  Post-op Pain: none  Post-op Assessment: Post-op Vital signs reviewed, PATIENT'S CARDIOVASCULAR STATUS UNSTABLE, Respiratory Function Stable and Pain level controlled, remains on pressors for hemodynamic support LLE Motor Response: Purposeful movement   RLE Motor Response: Purposeful movement        Post-op Vital Signs: Reviewed and unstable , on Vasopressin and Norepinephrine Last Vitals:  Filed Vitals:   01/09/15 1800  BP: 102/39  Pulse: 65  Temp:   Resp: 16    Complications: No apparent anesthesia complications, critically ill with multiorgan failure s/p cardio-resp arrest

## 2015-01-09 NOTE — Progress Notes (Signed)
Pt head of bed slightly decreased to pull pt up in bed.  Pt coughed and bowel visible around wound vac increased in size approximately 4 cm. Bowel continued to protrude and Elink MD [facility] notified. 2mg  ativan given per MD order.  MD Jamison Neighbor to notify [facility].  Will monitor pt.

## 2015-01-09 NOTE — Progress Notes (Signed)
eLink Physician-Brief Progress Note Patient Name: Jared [name] DOB: [birth year] MRN: [id number]   Date of Service  01/09/2015  HPI/Events of Note  rn calling again - abd increased distbn  eICU Interventions  Hold IV Heparin gtt Dr Delton Coombes to bedside     Intervention Category Evaluation Type: Other  [name] 01/09/2015, 4:31 PM

## 2015-01-09 NOTE — Progress Notes (Signed)
ANTICOAGULATION CONSULT NOTE - Follow Up Consult  Pharmacy Consult for Heparin  Indication: Rule out PE  Allergies  Allergen Reactions  . Morphine And Related Anaphylaxis and Shortness Of Breath  . Montelukast Other (See Comments)    unknown  . Neurontin [Gabapentin] Other (See Comments)    delusional  . Wellbutrin [Bupropion] Palpitations    Insomnia     Vital Signs: Temp: 99.3 F (37.4 C) (08/18 2340) Temp Source: Oral (08/18 2340) BP: 78/33 mmHg (08/19 0100) Pulse Rate: 28 (08/18 2210)  Labs:  Recent Labs  01/08/15 1100  01/08/15 1350 01/08/15 1404 01/08/15 1435 01/08/15 1500 01/08/15 1900 01/09/15 0148 01/09/15 0149  HGB 17.1*  < >  --  16.0 15.0  --   --  18.0*  --   HCT 48.8  < >  --  47.0 44.0  --   --  48.9  --   PLT 186  --   --   --   --   --   --  208  --   LABPROT  --   --  18.5*  --   --   --   --   --   --   INR  --   --  1.54*  --   --   --   --   --   --   HEPARINUNFRC  --   --   --   --   --   --   --   --  0.18*  CREATININE 4.95*  < >  --  3.70*  --  4.39*  --  5.34*  --   TROPONINI 0.47*  --  1.13*  --   --   --  15.07*  --  >65.00*  < > = values in this interval not displayed.  Estimated Creatinine Clearance: 20.4 mL/min (by C-G formula based on Cr of 5.34).   Assessment: Surgery on 8/18, code event s/p surgery, on heparin for rule out PE, initial heparin level is low at 0.18, other labs reviewed, no issues per RN.   Goal of Therapy:  Heparin level 0.3-0.7 units/ml Monitor platelets by anticoagulation protocol: Yes   Plan:  -Increase heparin to 2100 units/hr -1200 HL -Daily CBC/HL -Monitor for bleeding -F/U work-up for heparin indications  Abran Duke 01/09/2015,3:47 AM

## 2015-01-09 NOTE — Progress Notes (Signed)
Pt wife stated that she was concerned about his pain medicine intake at home. She said "he occasionally gets drowsy and I tell him to stop taking so much medicine. He recently bought a safe to put in his closet where he puts his pain meds. He won't give me the combination." I addressed this with Lucia Gaskins the case manager. Will discuss with social work.

## 2015-01-09 NOTE — Progress Notes (Signed)
PULMONARY / CRITICAL CARE MEDICINE   Name: Jared [name] MRN: [id number] DOB: 17-Nov-1955    ADMISSION DATE:  01/08/2015 CONSULTATION DATE:  8/18  REFERRING MD :  EDP Dr Criss Alvine  CHIEF COMPLAINT:  Found down  INITIAL PRESENTATION:   59 yo male had severe abdominal pain, and then found unresponsive at home.  In ER he was in shock, cyanotic, with lactic acidosis, and hyperkalemia.  STUDIES:  8/18 Echo >> EF 45 to 50%, mod RV dilation, mod/severe RV dysfx, PAS 47 mmHg 8/18 Renal u/s >> b/l renal echogenicity  SIGNIFICANT EVENTS: 8/18 Admit, CCS, cardiology consulted; brief PEA arrest; laparotomy with placement of wound vac; started heparin gtt for presumed PE 8/19 Renal consulted  SUBJECTIVE:  Weaning pressors.    VITAL SIGNS: Temp:  [97.4 F (36.3 C)-99.3 F (37.4 C)] 97.4 F (36.3 C) (08/19 0417) Pulse Rate:  [25-251] 93 (08/19 0815) Resp:  [11-62] 22 (08/19 0815) BP: (55-157)/(19-107) 99/80 mmHg (08/19 0800) SpO2:  [0 %-100 %] 96 % (08/19 0815) Arterial Line BP: (61-168)/(34-87) 125/64 mmHg (08/19 0815) FiO2 (%):  [60 %-100 %] 70 % (08/19 0725) HEMODYNAMICS:   VENTILATOR SETTINGS: Vent Mode:  [-] PRVC FiO2 (%):  [60 %-100 %] 70 % Set Rate:  [10 bmp-25 bmp] 25 bmp Vt Set:  [620 mL-670 mL] 670 mL PEEP:  [5 cmH20-8 cmH20] 8 cmH20 Plateau Pressure:  [24 cmH20-30 cmH20] 30 cmH20 INTAKE / OUTPUT:  Intake/Output Summary (Last 24 hours) at 01/09/15 0907 Last data filed at 01/09/15 0800  Gross per 24 hour  Intake 4870.94 ml  Output   2430 ml  Net 2440.94 ml    PHYSICAL EXAMINATION: General: ill appearing Neuro: alert, follows commands, moves extremities HEENT: ETT in place Cardiovascular: regular, no murmur Lungs: faint basilar crackles Abdomen: mild distention, wound vac in place Musculoskeletal: no edema  LABS:  CBC  Recent Labs Lab 01/08/15 1100  01/08/15 1404 01/08/15 1435 01/09/15 0148  WBC 20.5*  --   --   --  27.1*  HGB 17.1*  < > 16.0 15.0  18.0*  HCT 48.8  < > 47.0 44.0 48.9  PLT 186  --   --   --  208  < > = values in this interval not displayed.   Coag's  Recent Labs Lab 01/08/15 1350  INR 1.54*   BMET  Recent Labs Lab 01/08/15 1100  01/08/15 1404 01/08/15 1435 01/08/15 1500 01/09/15 0148  NA 133*  < > 123* 135 137 134*  K 6.4*  < > 6.6* 5.0 3.5 3.8  CL 92*  < > 91*  --  99* 98*  CO2 20*  --   --   --  15* 20*  BUN 44*  < > 46*  --  46* 48*  CREATININE 4.95*  < > 3.70*  --  4.39* 5.34*  GLUCOSE 170*  < > 444*  --  271* 113*  < > = values in this interval not displayed. Electrolytes  Recent Labs Lab 01/08/15 1100 01/08/15 1350 01/08/15 1500 01/09/15 0148  CALCIUM 8.8*  --  8.0* 7.6*  MG  --  1.9  --  2.5*  PHOS  --   --   --  3.7   Sepsis Markers  Recent Labs Lab 01/08/15 1356 01/08/15 1630 01/09/15 0130  LATICACIDVEN 5.54* 9.3* 3.5*   ABG  Recent Labs Lab 01/08/15 2012 01/09/15 0008 01/09/15 0410  PHART 7.317* 7.390 7.437  PCO2ART 31.2* 35.6 32.4*  PO2ART 137* 118* 99.8  Liver Enzymes  Recent Labs Lab 01/08/15 1100  AST 626*  ALT 729*  ALKPHOS 73  BILITOT 1.3*  ALBUMIN 4.1   Cardiac Enzymes  Recent Labs Lab 01/08/15 1350 01/08/15 1900 01/09/15 0149  TROPONINI 1.13* 15.07* >65.00*   Glucose  Recent Labs Lab 01/08/15 1627 01/08/15 2003 01/08/15 2338 01/09/15 0353 01/09/15 0358 01/09/15 0802  GLUCAP 169* 306* 232* 86 97 101*    Imaging US Renal Port  01/08/2015   CLINICAL DATA:  Acute renal failure.  EXAM: RENAL / URINARY TRACT ULTRASOUND COMPLETE  COMPARISON:  05/16/2012  FINDINGS: Right Kidney:  Length: 12.3 cm. The right kidney is more echogenic than the adjacent liver. Poor corticomedullary differentiation. No stones, mass, or hydronephrosis.  Left Kidney:  Length: 12.3 cm. Mildly echogenic left kidney. Poor corticomedullary differentiation. No stones, mass, or hydronephrosis identified. Poor delineation of the upper pole of the kidney due to poor sonic  window.  Bladder:  Empty; Foley catheter in place.  IMPRESSION: 1. Bilateral renal echogenicity with relatively normal renal size, and size not appreciably less than on 05/16/2012. Appearance could be from chronic medical renal disease, or late acute or chronic glomerulonephritis. Renal biopsy can help in further workup if clinically warranted.   Electronically Signed   By: Gaylyn Rong M.D.   On: 01/08/2015 17:12   Dg Chest Port 1 View  01/09/2015   CLINICAL DATA:  Respiratory failure.  EXAM: PORTABLE CHEST - 1 VIEW  COMPARISON:  01/08/2015.  FINDINGS: Endotracheal tube and NG tube in good anatomic position. Cardiomegaly with pulmonary vascular prominence and diffuse interstitial prominence with bilateral effusions noted. Findings consistent with congestive heart failure. No pneumothorax. Prior cervical spine fusion.  IMPRESSION: 1. Lines and tubes in stable position. 2. Cardiomegaly with pulmonary venous congestion, bilateral interstitial prominence, and left-sided pleural effusion consistent with congestive heart failure.   Electronically Signed   By: Maisie Fus  Register   On: 01/09/2015 07:16   Dg Chest Portable 1 View  01/08/2015   CLINICAL DATA:  Intubation.  EXAM: PORTABLE CHEST - 1 VIEW  COMPARISON:  Earlier film, same date.  FINDINGS: The endotracheal to is 4 cm above the carina. The NG tube is coursing down the esophagus and into the stomach. The heart is enlarged but stable. Prominent mediastinal and hilar contours are unchanged. External pacer paddles are noted. The lungs are grossly clear and stable. Persistent bibasilar atelectasis.  IMPRESSION: Support apparatus in good position without complicating features.  Stable cardiac enlargement. No significant pulmonary findings. Persistent bibasilar atelectasis.   Electronically Signed   By: Rudie Meyer M.D.   On: 01/08/2015 17:05   Dg Chest Port 1 View  01/08/2015   CLINICAL DATA:  Shortness of breath, cyanosis and abdominal pain. Femoral line  placement.  EXAM: PORTABLE CHEST - 1 VIEW  COMPARISON:  01/08/2015 and 05/16/2012.  FINDINGS: 1328 hr. Two views obtained semi erect. Cardiomegaly appears unchanged. There is mild atelectasis at both lung bases. No evidence of pneumoperitoneum, pneumothorax or significant pleural effusion. Postsurgical changes are present status post lower cervical fusion. Telemetry leads overlie the chest.  IMPRESSION: Stable cardiomegaly and bibasilar atelectasis.  No new findings.   Electronically Signed   By: Carey Bullocks M.D.   On: 01/08/2015 13:50   Dg Chest Port 1 View  01/08/2015   CLINICAL DATA:  Confusion and dyspnea since this morning.  EXAM: PORTABLE CHEST - 1 VIEW  COMPARISON:  05/16/2012.  FINDINGS: The heart is mildly enlarged but stable. The mediastinal and hilar  contours are slightly prominent but unchanged. Mild vascular congestion but no edema, infiltrates or effusions. The bony thorax is intact.  IMPRESSION: Stable cardiac enlargement and mild vascular congestion. No pulmonary edema or pleural effusions.   Electronically Signed   By: Rudie Meyer M.D.   On: 01/08/2015 11:42   Dg Abd Portable 1v  01/08/2015   CLINICAL DATA:  Abdominal pain  EXAM: PORTABLE ABDOMEN - 1 VIEW  COMPARISON:  CT abdomen and pelvis April 25, 2012  FINDINGS: There is moderate stool in the colon. No obstruction is appreciable on this supine examination. No free air is seen on this supine examination. There is postoperative change at L5 and S1. There is a right femoral catheter with the tip overlying the mid sacrum. Catheter tip is likely in the right external iliac vein region.  IMPRESSION: Right frontal region catheter with tip overlying the mid right sacrum. No obstruction or free air is seen on this supine examination. Note that evaluation for free air is limited on supine only examination.   Electronically Signed   By: Bretta Bang III M.D.   On: 01/08/2015 13:42     ASSESSMENT / PLAN:  PULMONARY ETT 8/18  >>> A: Acute respiratory failure 2nd to presumed PE, lactic acidosis. Hx of OSA. P:   Full vent support F/u CXR, ABG Adjust PEEP/FiO2 to keep SpO2 > 92%  CARDIOVASCULAR Rt femoral CVL 8/18 >> Lt femoral A line 8/18 >> A: Shock >> likely septic, cardiogenic. RV failure with presumed PE. Hx of HTN, HLD. P:  Wean pressors to keep MAP > 65 Continue amiodarone per cardiology Continue heparin gtt Check doppler legs Defer CT chest with contrast due to concern for renal dysfx Hold outpt lipitor, bisoprolol, HCTZ, losartan, minoxidil  RENAL AKI >> baseline creatinine 1.14 from 12/17/14. Hyperkalemia >> imrpoved Hyponatremia. Metabolic acidosis. Hx of BPH. P:   Check urine studies Will consult nephrology  GASTROINTESTINAL A: Acute abdominal pain s/p laparotomy with wound vac 8/18. P:   Post-op care per surgery Tentative plan to OR for washout 8/20 Protonix for SUP  HEMATOLOGIC A: Polycythemia ?hemoconcentration. P:  F/u CBC  INFECTIOUS A: Sepsis with presumed abdominal source. P:   Day 2 vancomycin, zosyn  Blood 8/18 >> Urine 8/18 >>  ENDOCRINE A: Hyperglycemia  P:   SSI   NEUROLOGIC A: Acute metabolic encephalopathy >> improved 8/18. Hx of chronic back pain, anxiety, depression. P:   RASS goal: -1 Hold outpt xanax, celexa, oxycodone, lyrica   D/w Dr. Corliss Skains.  CC time 40 minutes.  Coralyn Helling, MD [facility] Pulmonary/Critical Care 01/09/2015, 9:21 AM Pager:  [phone] After 3pm call: [phone]

## 2015-01-09 NOTE — Progress Notes (Signed)
eLink Physician-Brief Progress Note Patient Name: Jared [name] DOB: Mar 19, 1956 MRN: [id number]   Date of Service  01/09/2015  HPI/Events of Note  PH on ABG 7.39/35/118/21.  eICU Interventions  D/C bicarb gtt Continue to monitor patient     Intervention Category Major Interventions: Acid-Base disturbance - evaluation and management  DETERDING,ELIZABETH 01/09/2015, 12:34 AM

## 2015-01-09 NOTE — Progress Notes (Signed)
eLink Physician-Brief Progress Note Patient Name: OLUWATOMI BRAUNSTEIN DOB: 06/10/1955 MRN: [id number]   Date of Service  01/09/2015  HPI/Events of Note  Patient on IV heparing gtt  For prresumed PE. Also p=-lap. RN reporting increased abd distenstion now - CCS Aware  Camera RASS -3 on sedation gtt Levophed need reducing  eICU Interventions  Stat labs - cbc, bmet, lactate, inr, LFT AXR     Intervention Category Intermediate Interventions: Other:  Semaja Lymon 01/09/2015, 4:09 PM

## 2015-01-09 NOTE — Progress Notes (Signed)
OG tube found to be coiled in mouth on 1630 assessment but stomach contents still coming out of suction catheter. OG not reinserted due to open abdomen with bleeding under vac. Reported to night shift nurse

## 2015-01-09 NOTE — Progress Notes (Signed)
Upon 1600 assessment, abdomen extremely distended compared to 0800 and 1200 assessments. Bowl is visible under plastic cover of wound vac and pushing around blue foam. Bleeding has increased under the dressing. Dr. Corliss Skains paged. He stated that the patient would be going back to OR tomorrow for abdominal closure and that if the vac were to rupture this afternoon to cover the rupture in dry gauze to soak up excess bleeding. Dr. Harlon Flor came to bedside and observed the change. Blood pressure is stable at this time, I am continuing to decrease the needed level of levophed as systolic is in the 120s (systolic goal 90). Pt still alert enough to follow commands upon vocal arousal. Fentanyl increased to then to decrease his RASS score. Elink called. I was given orders by Dr. Marchelle Gearing to alert pharmacy of the bleeding and to titrate the level of heparin to the low end of therapeutic level. Pharmacist changed heparin orders. Dr. Marchelle Gearing also ordered labs as well as KUB.  XRay tech came to do KUB, abdomen increased in size and increased bleeding from previous assessment listed above. Elink came in the room via camera and gave instructions to hold KUB and to stop heparin.  Dr. Delton Coombes came to unit to assess patient and spoke with Barnetta Chapel PA via telephone. Per Dr. Delton Coombes I was instructed that the patient does not need to be rushed back into surgery and that wound vac is not at risk of rupturing. Elink gave instructions to hold heparin until Hgb results came back.

## 2015-01-09 NOTE — Progress Notes (Signed)
eLink Physician-Brief Progress Note Patient Name: Jared [name] DOB: June 27, 1955 MRN: [id number]   Date of Service  01/09/2015  HPI/Events of Note  Patient with recent code event s/p defib.  Now with trop of greater than 65.  On heparin gtt and pressors  eICU Interventions  Plan: Suspect elevated trop in part due to code situation with defib.   Continue to cycle trop     Intervention Category Minor Interventions: Clinical assessment - ordering diagnostic tests  DETERDING,ELIZABETH 01/09/2015, 3:10 AM

## 2015-01-09 NOTE — Progress Notes (Signed)
Initial Nutrition Assessment  DOCUMENTATION CODES:   Obesity unspecified  INTERVENTION:    When able to begin enteral nutrition, recommend start Vital High Protein at 10 ml/h, increase by 10 ml every 6-12 hours to goal rate of 55 ml/h with Prostat 30 ml QID to provide 1720 kcals, 176 gm protein, 1104 ml free water daily.  NUTRITION DIAGNOSIS:   Inadequate oral intake related to inability to eat as evidenced by NPO status.  GOAL:   Provide needs based on ASPEN/SCCM guidelines  MONITOR:   Vent status, Labs, Weight trends  REASON FOR ASSESSMENT:   Ventilator    ASSESSMENT:   59 yo male had severe abdominal pain, and then found unresponsive at home. In ER he was in shock, cyanotic, with lactic acidosis, and hyperkalemia. S/P ex lap with placement of wound VAC to abd wound.  Unable to complete Nutrition-Focused physical exam at this time. Discussed patient in ICU rounds and with RN today. No plans to start enteral nutrition at this time. Patient with increased nutrient needs to support healing of open abdomen wound.  Patient is currently intubated on ventilator support Temp (24hrs), Avg:98.3 F (36.8 C), Min:97.4 F (36.3 C), Max:99.8 F (37.7 C)   Diet Order:  Diet NPO time specified  Skin:  Wound (see comment) (VAC to abdominal wound)  Last BM:  unknown  Height:   Ht Readings from Last 1 Encounters:  01/05/15 6' 2.5" (1.892 m)    Weight:   Wt Readings from Last 1 Encounters:  01/05/15 272 lb 12.8 oz (123.741 kg)    Ideal Body Weight:  87.7 kg  BMI:  34.6  Estimated Nutritional Needs:   Kcal:  5056-9794  Protein:  >/= 175 gm  Fluid:  2 L  EDUCATION NEEDS:   No education needs identified at this time   Joaquin Courts, RD, LDN, CNSC Pager [phone] After Hours Pager [phone]

## 2015-01-09 NOTE — Progress Notes (Signed)
VASCULAR LAB PRELIMINARY  PRELIMINARY  PRELIMINARY  PRELIMINARY  Bilateral lower extremity venous duplex  completed.    Preliminary report:  Bilateral:  No evidence of DVT, superficial thrombosis, or Baker's Cyst.    Chevon Laufer, RVT 01/09/2015, 3:05 PM

## 2015-01-09 NOTE — Addendum Note (Signed)
Addendum  created 01/09/15 1839 by Jairo Ben, MD   Modules edited: Notes Section   Notes Section:  File: [id number]

## 2015-01-09 NOTE — Progress Notes (Signed)
Notified surgeon on call re: new bright red blood in wound vac, oozing around abdominal site, and increased abdominal distension while on heparin infusion. Advised to continue heparin and leave wound vac running. Will continue to monitor closely.

## 2015-01-09 NOTE — Progress Notes (Signed)
SUBJECTIVE:  Intubated. Able to answer questions.  No chest pain  OBJECTIVE:   Vitals:   Filed Vitals:   01/09/15 0815 01/09/15 0900 01/09/15 1000 01/09/15 1100  BP:  101/63 124/100 96/79  Pulse: 93 96 65 67  Temp:      TempSrc:      Resp: 22 25 17 18   SpO2: 96% 97% 94% 94%   I&O's:   Intake/Output Summary (Last 24 hours) at 01/09/15 1138 Last data filed at 01/09/15 1100  Gross per 24 hour  Intake 4975.54 ml  Output   2430 ml  Net 2545.54 ml   TELEMETRY: Reviewed telemetry pt in NSR:     PHYSICAL EXAM General: Well developed, well nourished, in no acute distress Head:   Normal cephalic and atramatic  Lungs:   Coarse breath sounds bilaterally to auscultation. Heart:   HRRR S1 S2  No JVD.    Msk:  Back normal,  Normal strength and tone for age. Extremities:   No edema.   Neuro: Alert and oriented. Psych:  Normal affect, responds appropriately Skin: No rash   LABS: Basic Metabolic Panel:  Recent Labs  25/05/39 1350  01/08/15 1500 01/09/15 0148  NA  --   < > 137 134*  K  --   < > 3.5 3.8  CL  --   < > 99* 98*  CO2  --   --  15* 20*  GLUCOSE  --   < > 271* 113*  BUN  --   < > 46* 48*  CREATININE  --   < > 4.39* 5.34*  CALCIUM  --   --  8.0* 7.6*  MG 1.9  --   --  2.5*  PHOS  --   --   --  3.7  < > = values in this interval not displayed. Liver Function Tests:  Recent Labs  01/08/15 1100  AST 626*  ALT 729*  ALKPHOS 73  BILITOT 1.3*  PROT 7.0  ALBUMIN 4.1   No results for input(s): LIPASE, AMYLASE in the last 72 hours. CBC:  Recent Labs  01/08/15 1100  01/08/15 1435 01/09/15 0148  WBC 20.5*  --   --  27.1*  NEUTROABS 19.2*  --   --   --   HGB 17.1*  < > 15.0 18.0*  HCT 48.8  < > 44.0 48.9  MCV 89.9  --   --  85.8  PLT 186  --   --  208  < > = values in this interval not displayed. Cardiac Enzymes:  Recent Labs  01/08/15 1900 01/09/15 0149 01/09/15 0914  TROPONINI 15.07* >65.00* >65.00*   BNP: Invalid input(s):  POCBNP D-Dimer: No results for input(s): DDIMER in the last 72 hours. Hemoglobin A1C: No results for input(s): HGBA1C in the last 72 hours. Fasting Lipid Panel: No results for input(s): CHOL, HDL, LDLCALC, TRIG, CHOLHDL, LDLDIRECT in the last 72 hours. Thyroid Function Tests: No results for input(s): TSH, T4TOTAL, T3FREE, THYROIDAB in the last 72 hours.  Invalid input(s): FREET3 Anemia Panel: No results for input(s): VITAMINB12, FOLATE, FERRITIN, TIBC, IRON, RETICCTPCT in the last 72 hours. Coag Panel:   Lab Results  Component Value Date   INR 1.54* 01/08/2015    RADIOLOGY: US Renal Port  01/08/2015   CLINICAL DATA:  Acute renal failure.  EXAM: RENAL / URINARY TRACT ULTRASOUND COMPLETE  COMPARISON:  05/16/2012  FINDINGS: Right Kidney:  Length: 12.3 cm. The right kidney is more echogenic than the adjacent  liver. Poor corticomedullary differentiation. No stones, mass, or hydronephrosis.  Left Kidney:  Length: 12.3 cm. Mildly echogenic left kidney. Poor corticomedullary differentiation. No stones, mass, or hydronephrosis identified. Poor delineation of the upper pole of the kidney due to poor sonic window.  Bladder:  Empty; Foley catheter in place.  IMPRESSION: 1. Bilateral renal echogenicity with relatively normal renal size, and size not appreciably less than on 05/16/2012. Appearance could be from chronic medical renal disease, or late acute or chronic glomerulonephritis. Renal biopsy can help in further workup if clinically warranted.   Electronically Signed   By: Gaylyn Rong M.D.   On: 01/08/2015 17:12   Dg Chest Port 1 View  01/09/2015   CLINICAL DATA:  Respiratory failure.  EXAM: PORTABLE CHEST - 1 VIEW  COMPARISON:  01/08/2015.  FINDINGS: Endotracheal tube and NG tube in good anatomic position. Cardiomegaly with pulmonary vascular prominence and diffuse interstitial prominence with bilateral effusions noted. Findings consistent with congestive heart failure. No pneumothorax. Prior  cervical spine fusion.  IMPRESSION: 1. Lines and tubes in stable position. 2. Cardiomegaly with pulmonary venous congestion, bilateral interstitial prominence, and left-sided pleural effusion consistent with congestive heart failure.   Electronically Signed   By: Maisie Fus  Register   On: 01/09/2015 07:16   Dg Chest Portable 1 View  01/08/2015   CLINICAL DATA:  Intubation.  EXAM: PORTABLE CHEST - 1 VIEW  COMPARISON:  Earlier film, same date.  FINDINGS: The endotracheal to is 4 cm above the carina. The NG tube is coursing down the esophagus and into the stomach. The heart is enlarged but stable. Prominent mediastinal and hilar contours are unchanged. External pacer paddles are noted. The lungs are grossly clear and stable. Persistent bibasilar atelectasis.  IMPRESSION: Support apparatus in good position without complicating features.  Stable cardiac enlargement. No significant pulmonary findings. Persistent bibasilar atelectasis.   Electronically Signed   By: Rudie Meyer M.D.   On: 01/08/2015 17:05   Dg Chest Port 1 View  01/08/2015   CLINICAL DATA:  Shortness of breath, cyanosis and abdominal pain. Femoral line placement.  EXAM: PORTABLE CHEST - 1 VIEW  COMPARISON:  01/08/2015 and 05/16/2012.  FINDINGS: 1328 hr. Two views obtained semi erect. Cardiomegaly appears unchanged. There is mild atelectasis at both lung bases. No evidence of pneumoperitoneum, pneumothorax or significant pleural effusion. Postsurgical changes are present status post lower cervical fusion. Telemetry leads overlie the chest.  IMPRESSION: Stable cardiomegaly and bibasilar atelectasis.  No new findings.   Electronically Signed   By: Carey Bullocks M.D.   On: 01/08/2015 13:50   Dg Chest Port 1 View  01/08/2015   CLINICAL DATA:  Confusion and dyspnea since this morning.  EXAM: PORTABLE CHEST - 1 VIEW  COMPARISON:  05/16/2012.  FINDINGS: The heart is mildly enlarged but stable. The mediastinal and hilar contours are slightly prominent but  unchanged. Mild vascular congestion but no edema, infiltrates or effusions. The bony thorax is intact.  IMPRESSION: Stable cardiac enlargement and mild vascular congestion. No pulmonary edema or pleural effusions.   Electronically Signed   By: Rudie Meyer M.D.   On: 01/08/2015 11:42   Dg Abd Portable 1v  01/08/2015   CLINICAL DATA:  Abdominal pain  EXAM: PORTABLE ABDOMEN - 1 VIEW  COMPARISON:  CT abdomen and pelvis April 25, 2012  FINDINGS: There is moderate stool in the colon. No obstruction is appreciable on this supine examination. No free air is seen on this supine examination. There is postoperative change at L5  and S1. There is a right femoral catheter with the tip overlying the mid sacrum. Catheter tip is likely in the right external iliac vein region.  IMPRESSION: Right frontal region catheter with tip overlying the mid right sacrum. No obstruction or free air is seen on this supine examination. Note that evaluation for free air is limited on supine only examination.   Electronically Signed   By: Bretta Bang III M.D.   On: 01/08/2015 13:42      ASSESSMENT: Roosvelt Harps:    Troponin elevation consistent with MI.  Also, patient with RV dysfunction suggestive of PE.  Continue IV heparin.  Hemodynamics are better than yesterday. Continue aggressive medical therapy. Once he recovers from this episode, he may need further ischemic cardiac evaluation. It will depend on his renal function as well.  CT of chest to evaluate pulmonary arteries when he is more stable.  Weaning pressors.  I also spoke to the wife at length. She states that he was not having pain in his chest prior to arrival he did have a tightness with some coughing. He also took four Lyrica tablets when he was only supposed to take 1-2 at nighttime to help sleep, on the night prior to admission.  Corky Crafts, MD  01/09/2015  11:38 AM

## 2015-01-09 NOTE — Progress Notes (Signed)
1 Day Post-Op  Subjective: Patient remains intubated, but eyes open, following commands, moving all extremities. On Levophed and Vasopressing Heparin gtt Has gross blood in edges of VAC, but Hgb 18 CCM is presumably treating PE with heparin gtt; no imaging yet  Objective: Vital signs in last 24 hours: Temp:  [97.4 F (36.3 C)-99.3 F (37.4 C)] 97.4 F (36.3 C) (08/19 0417) Pulse Rate:  [25-251] 93 (08/19 0815) Resp:  [11-62] 22 (08/19 0815) BP: (55-157)/(19-107) 99/80 mmHg (08/19 0800) SpO2:  [0 %-100 %] 96 % (08/19 0815) Arterial Line BP: (61-168)/(34-87) 125/64 mmHg (08/19 0815) FiO2 (%):  [60 %-100 %] 70 % (08/19 0725)    Intake/Output from previous day: 08/18 0701 - 08/19 0700 In: 4821.7 [I.V.:3946.7; IV Piggyback:875] Out: 2355 [Urine:805; Drains:1500; Blood:50] Intake/Output this shift: Total I/O In: 49.2 [I.V.:49.2] Out: -   General appearance: intubated; following commands; eyes open spontaneously Resp: rhonchi bilaterally Cardio: tachycardic; reg rhythm GI: distended; absent bowel sounds VAC with good seal; some gross blood at edges  Lab Results:   Recent Labs  01/08/15 1100  01/08/15 1435 01/09/15 0148  WBC 20.5*  --   --  27.1*  HGB 17.1*  < > 15.0 18.0*  HCT 48.8  < > 44.0 48.9  PLT 186  --   --  208  < > = values in this interval not displayed. BMET  Recent Labs  01/08/15 1500 01/09/15 0148  NA 137 134*  K 3.5 3.8  CL 99* 98*  CO2 15* 20*  GLUCOSE 271* 113*  BUN 46* 48*  CREATININE 4.39* 5.34*  CALCIUM 8.0* 7.6*   PT/INR  Recent Labs  01/08/15 1350  LABPROT 18.5*  INR 1.54*   ABG  Recent Labs  01/09/15 0008 01/09/15 0410  PHART 7.390 7.437  HCO3 21.0 21.7    Studies/Results: US Renal Port  01/08/2015   CLINICAL DATA:  Acute renal failure.  EXAM: RENAL / URINARY TRACT ULTRASOUND COMPLETE  COMPARISON:  05/16/2012  FINDINGS: Right Kidney:  Length: 12.3 cm. The right kidney is more echogenic than the adjacent liver. Poor  corticomedullary differentiation. No stones, mass, or hydronephrosis.  Left Kidney:  Length: 12.3 cm. Mildly echogenic left kidney. Poor corticomedullary differentiation. No stones, mass, or hydronephrosis identified. Poor delineation of the upper pole of the kidney due to poor sonic window.  Bladder:  Empty; Foley catheter in place.  IMPRESSION: 1. Bilateral renal echogenicity with relatively normal renal size, and size not appreciably less than on 05/16/2012. Appearance could be from chronic medical renal disease, or late acute or chronic glomerulonephritis. Renal biopsy can help in further workup if clinically warranted.   Electronically Signed   By: Gaylyn Rong M.D.   On: 01/08/2015 17:12   Dg Chest Port 1 View  01/09/2015   CLINICAL DATA:  Respiratory failure.  EXAM: PORTABLE CHEST - 1 VIEW  COMPARISON:  01/08/2015.  FINDINGS: Endotracheal tube and NG tube in good anatomic position. Cardiomegaly with pulmonary vascular prominence and diffuse interstitial prominence with bilateral effusions noted. Findings consistent with congestive heart failure. No pneumothorax. Prior cervical spine fusion.  IMPRESSION: 1. Lines and tubes in stable position. 2. Cardiomegaly with pulmonary venous congestion, bilateral interstitial prominence, and left-sided pleural effusion consistent with congestive heart failure.   Electronically Signed   By: Maisie Fus  Register   On: 01/09/2015 07:16   Dg Chest Portable 1 View  01/08/2015   CLINICAL DATA:  Intubation.  EXAM: PORTABLE CHEST - 1 VIEW  COMPARISON:  Earlier film,  same date.  FINDINGS: The endotracheal to is 4 cm above the carina. The NG tube is coursing down the esophagus and into the stomach. The heart is enlarged but stable. Prominent mediastinal and hilar contours are unchanged. External pacer paddles are noted. The lungs are grossly clear and stable. Persistent bibasilar atelectasis.  IMPRESSION: Support apparatus in good position without complicating features.   Stable cardiac enlargement. No significant pulmonary findings. Persistent bibasilar atelectasis.   Electronically Signed   By: Rudie Meyer M.D.   On: 01/08/2015 17:05   Dg Chest Port 1 View  01/08/2015   CLINICAL DATA:  Shortness of breath, cyanosis and abdominal pain. Femoral line placement.  EXAM: PORTABLE CHEST - 1 VIEW  COMPARISON:  01/08/2015 and 05/16/2012.  FINDINGS: 1328 hr. Two views obtained semi erect. Cardiomegaly appears unchanged. There is mild atelectasis at both lung bases. No evidence of pneumoperitoneum, pneumothorax or significant pleural effusion. Postsurgical changes are present status post lower cervical fusion. Telemetry leads overlie the chest.  IMPRESSION: Stable cardiomegaly and bibasilar atelectasis.  No new findings.   Electronically Signed   By: Carey Bullocks M.D.   On: 01/08/2015 13:50   Dg Chest Port 1 View  01/08/2015   CLINICAL DATA:  Confusion and dyspnea since this morning.  EXAM: PORTABLE CHEST - 1 VIEW  COMPARISON:  05/16/2012.  FINDINGS: The heart is mildly enlarged but stable. The mediastinal and hilar contours are slightly prominent but unchanged. Mild vascular congestion but no edema, infiltrates or effusions. The bony thorax is intact.  IMPRESSION: Stable cardiac enlargement and mild vascular congestion. No pulmonary edema or pleural effusions.   Electronically Signed   By: Rudie Meyer M.D.   On: 01/08/2015 11:42   Dg Abd Portable 1v  01/08/2015   CLINICAL DATA:  Abdominal pain  EXAM: PORTABLE ABDOMEN - 1 VIEW  COMPARISON:  CT abdomen and pelvis April 25, 2012  FINDINGS: There is moderate stool in the colon. No obstruction is appreciable on this supine examination. No free air is seen on this supine examination. There is postoperative change at L5 and S1. There is a right femoral catheter with the tip overlying the mid sacrum. Catheter tip is likely in the right external iliac vein region.  IMPRESSION: Right frontal region catheter with tip overlying the mid  right sacrum. No obstruction or free air is seen on this supine examination. Note that evaluation for free air is limited on supine only examination.   Electronically Signed   By: Bretta Bang III M.D.   On: 01/08/2015 13:42    Anti-infectives: Anti-infectives    Start     Dose/Rate Route Frequency Ordered Stop   01/10/15 1300  vancomycin (VANCOCIN) 1,750 mg in sodium chloride 0.9 % 500 mL IVPB     1,750 mg 250 mL/hr over 120 Minutes Intravenous Every 48 hours 01/08/15 1327     01/09/15 1100  cefTRIAXone (ROCEPHIN) 1 g in dextrose 5 % 50 mL IVPB  Status:  Discontinued     1 g 100 mL/hr over 30 Minutes Intravenous Every 24 hours 01/08/15 1133 01/08/15 1327   01/09/15 1100  azithromycin (ZITHROMAX) 500 mg in dextrose 5 % 250 mL IVPB  Status:  Discontinued     500 mg 250 mL/hr over 60 Minutes Intravenous Every 24 hours 01/08/15 1133 01/08/15 1327   01/08/15 2200  piperacillin-tazobactam (ZOSYN) IVPB 3.375 g     3.375 g 12.5 mL/hr over 240 Minutes Intravenous 3 times per day 01/08/15 1327  01/08/15 1400  vancomycin (VANCOCIN) 2,500 mg in sodium chloride 0.9 % 500 mL IVPB     2,500 mg 250 mL/hr over 120 Minutes Intravenous  Once 01/08/15 1327 01/08/15 1747   01/08/15 1330  piperacillin-tazobactam (ZOSYN) IVPB 3.375 g     3.375 g 100 mL/hr over 30 Minutes Intravenous  Once 01/08/15 1316     01/08/15 1130  cefTRIAXone (ROCEPHIN) 1 g in dextrose 5 % 50 mL IVPB     1 g 100 mL/hr over 30 Minutes Intravenous  Once 01/08/15 1126 01/08/15 1220   01/08/15 1130  azithromycin (ZITHROMAX) 500 mg in dextrose 5 % 250 mL IVPB     500 mg 250 mL/hr over 60 Minutes Intravenous  Once 01/08/15 1126 01/08/15 1322      Assessment/Plan: s/p Procedure(s): EXPLORATORY LAPAROTOMY (N/A) APPLICATION OF WOUND VAC (N/A) Patient empirically heparinized for presumed PE.  This is causing some oozing in the abdomen, but not hemodynamically significant  Will need VAC change probably tomorrow. Remarkable  improvement from yesterday.   LOS: 1 day    Mahaley Schwering K. 01/09/2015

## 2015-01-09 NOTE — Progress Notes (Signed)
eLink Physician-Brief Progress Note Patient Name: Jared [name] DOB: 1956/01/07 MRN: [id number]   Concerns about intra-perit ennglargement earlier . Some blood seen at periphery of wound vac but no obvious bleed. LABS all done    I/O last 3 completed shifts: In: 5750 [I.V.:4746.5; IV Piggyback:1003.5] Out: 3355 [Urine:805; Emesis/NG output:125; Drains:2375; Blood:50]    - ABG OK  0- No Hgb drop - INR 2.24 (patient on IV heparin gtt) - trop still > 65 - worsening ATN - creat 7.3 but K and ABG ok and making urine =- shock liover + = lactate ok  A) shock liver  auto-anticog  P) Heparin shut off hours earlier ATN needs to be watched  Dr. Kalman Shan, M.D., [facility].C.P Pulmonary and Critical Care Medicine Staff Physician [facility] Pager: [phone], If no answer or between  15:00h - 7:00h: call [phone]  01/09/2015 8:48 PM

## 2015-01-09 NOTE — Consult Note (Signed)
HPI: I was asked to see Jared [name] who is a 59 y.o. male with hx of HTN, HLD, DJD, OSA, depression, who presented to the hospital on 8/18 after being found down at home by his wife and unresponsive. Was having agonal respirations. Initial O2 by EMS was 80%. He responded to Narcan and bipap. He was profoundly hypotensive refractory to fluid and cyanotic. Was put on pressor. Developed vtach/vfib, had loss of pulse, ACLS protocol was underway, was shocked and started on amio with ROSC. Lactate was 8.5. Exp lap was done which showed to rule out ab perf or bowel ischemia, did not find intra-abdominal causes of the decompensation. Cards was consulted. Was put on vanc+zosyn as well to cover for infectious cause.   CXR showed stable cardiomegaly and bibasilar atelectasis. ECHO was done showing RV and RA dilation, RV hypocontractile consistent with PE. No effusion. LVEF was 50-55%. Heparin gtt was started, had bright red blood in wound vac and oozing around abd site, gtt was continued. CT angio chest was deferred b/[name] of renal impairment.   Was put on bicarb drip for acidosis. Had trop of >65.00, thought to be from ACLS.   Currently remains on pressors and oliguric.   Past Medical History  Diagnosis Date  . Chest pain   . HTN (hypertension)   . Hyperlipemia   . DJD (degenerative joint disease)   . BPH (benign prostatic hypertrophy)   . Obesity   . OSA (obstructive sleep apnea)   . Hypercholesteremia   . Chronic back pain   . Anxiety   . Depression    Past Surgical History  Procedure Laterality Date  . Back surgery    . Prostatectomy    . Cervical spine surgery     Social History:  reports that he has quit smoking. He does not have any smokeless tobacco history on file. He reports that he does not drink alcohol or use illicit drugs. Allergies:  Allergies  Allergen Reactions  . Morphine And Related Anaphylaxis and Shortness Of Breath  . Montelukast Other (See Comments)    unknown  .  Neurontin [Gabapentin] Other (See Comments)    delusional  . Wellbutrin [Bupropion] Palpitations    Insomnia   History reviewed. No pertinent family history.  Medications: I have reviewed the patient's current medications.  ROS: unable to obtain full ROS as patient is intubated. Denies any chest pain, has some SOB.   Blood pressure 109/52, pulse 64, temperature 99.8 F (37.7 [name]), temperature source Oral, resp. rate 17, SpO2 94 %.  Physical Exam  Constitutional:  Intubated, calm, able to nod to answer some questions.   HENT:  Head: Normocephalic and atraumatic.  Eyes: Conjunctivae and EOM are normal. Pupils are equal, round, and reactive to light.  Neck: Normal range of motion.  Cardiovascular: Normal rate and regular rhythm.  Exam reveals no friction rub.   No murmur heard. Respiratory: Effort normal and breath sounds normal. No respiratory distress. He exhibits no tenderness.  GI:  Midline incision covered by wound vanc. Drain has blood. abd mildly distended.   Musculoskeletal: He exhibits no edema or tenderness.  Neurological:  Intubated, awake and able to answer some questions by nodding.     Results for orders placed or performed during the hospital encounter of 01/08/15 (from the past 48 hour(s))  CBG monitoring, ED     Status: Abnormal   Collection Time: 01/08/15 10:49 AM  Result Value Ref Range   Glucose-Capillary 159 (H) 65 - 99  mg/dL  I-Stat arterial blood gas, ED     Status: Abnormal   Collection Time: 01/08/15 10:56 AM  Result Value Ref Range   pH, Arterial 7.310 (L) 7.350 - 7.450   pCO2 arterial 32.7 (L) 35.0 - 45.0 mmHg   pO2, Arterial 281.0 (H) 80.0 - 100.0 mmHg   Bicarbonate 16.6 (L) 20.0 - 24.0 mEq/L   TCO2 18 0 - 100 mmol/L   O2 Saturation 100.0 %   Acid-base deficit 9.0 (H) 0.0 - 2.0 mmol/L   Patient temperature 97.4 F    Collection site RADIAL, ALLEN'S TEST ACCEPTABLE    Drawn by Operator    Sample type ARTERIAL   Comprehensive metabolic panel      Status: Abnormal   Collection Time: 01/08/15 11:00 AM  Result Value Ref Range   Sodium 133 (L) 135 - 145 mmol/L   Potassium 6.4 (HH) 3.5 - 5.1 mmol/L    Comment: NO VISIBLE HEMOLYSIS CRITICAL RESULT CALLED TO, READ BACK BY AND VERIFIED WITH: BRENDA MICHAELSON,RN AT 1207 01/08/15 BY ZBEECH.    Chloride 92 (L) 101 - 111 mmol/L   CO2 20 (L) 22 - 32 mmol/L   Glucose, Bld 170 (H) 65 - 99 mg/dL   BUN 44 (H) 6 - 20 mg/dL   Creatinine, Ser 4.95 (H) 0.61 - 1.24 mg/dL   Calcium 8.8 (L) 8.9 - 10.3 mg/dL   Total Protein 7.0 6.5 - 8.1 g/dL   Albumin 4.1 3.5 - 5.0 g/dL   AST 626 (H) 15 - 41 U/L   ALT 729 (H) 17 - 63 U/L   Alkaline Phosphatase 73 38 - 126 U/L   Total Bilirubin 1.3 (H) 0.3 - 1.2 mg/dL   GFR calc non Af Amer 11 (L) >60 mL/min   GFR calc Af Amer 13 (L) >60 mL/min    Comment: (NOTE) The eGFR has been calculated using the CKD EPI equation. This calculation has not been validated in all clinical situations. eGFR's persistently <60 mL/min signify possible Chronic Kidney Disease.    Anion gap 21 (H) 5 - 15  Ethanol     Status: None   Collection Time: 01/08/15 11:00 AM  Result Value Ref Range   Alcohol, Ethyl (B) <5 <5 mg/dL    Comment:        LOWEST DETECTABLE LIMIT FOR SERUM ALCOHOL IS 5 mg/dL FOR MEDICAL PURPOSES ONLY   Troponin I     Status: Abnormal   Collection Time: 01/08/15 11:00 AM  Result Value Ref Range   Troponin I 0.47 (H) <0.031 ng/mL    Comment:        PERSISTENTLY INCREASED TROPONIN VALUES IN THE RANGE OF 0.04-0.49 ng/mL CAN BE SEEN IN:       -UNSTABLE ANGINA       -CONGESTIVE HEART FAILURE       -MYOCARDITIS       -CHEST TRAUMA       -ARRYHTHMIAS       -LATE PRESENTING MYOCARDIAL INFARCTION       -COPD   CLINICAL FOLLOW-UP RECOMMENDED.   Brain natriuretic peptide     Status: Abnormal   Collection Time: 01/08/15 11:00 AM  Result Value Ref Range   B Natriuretic Peptide 495.3 (H) 0.0 - 100.0 pg/mL  CBC with Differential     Status: Abnormal    Collection Time: 01/08/15 11:00 AM  Result Value Ref Range   WBC 20.5 (H) 4.0 - 10.5 K/uL   RBC 5.43 4.22 - 5.81 MIL/uL  Hemoglobin 17.1 (H) 13.0 - 17.0 g/dL   HCT 48.8 39.0 - 52.0 %   MCV 89.9 78.0 - 100.0 fL   MCH 31.5 26.0 - 34.0 pg   MCHC 35.0 30.0 - 36.0 g/dL   RDW 12.8 11.5 - 15.5 %   Platelets 186 150 - 400 K/uL   Neutrophils Relative % 94 (H) 43 - 77 %   Neutro Abs 19.2 (H) 1.7 - 7.7 K/uL   Lymphocytes Relative 3 (L) 12 - 46 %   Lymphs Abs 0.6 (L) 0.7 - 4.0 K/uL   Monocytes Relative 3 3 - 12 %   Monocytes Absolute 0.7 0.1 - 1.0 K/uL   Eosinophils Relative 0 0 - 5 %   Eosinophils Absolute 0.0 0.0 - 0.7 K/uL   Basophils Relative 0 0 - 1 %   Basophils Absolute 0.0 0.0 - 0.1 K/uL  I-Stat CG4 Lactic Acid, ED     Status: Abnormal   Collection Time: 01/08/15 11:15 AM  Result Value Ref Range   Lactic Acid, Venous 8.53 (HH) 0.5 - 2.0 mmol/L   Comment NOTIFIED PHYSICIAN   I-stat Chem 8, ED     Status: Abnormal   Collection Time: 01/08/15 11:56 AM  Result Value Ref Range   Sodium 129 (L) 135 - 145 mmol/L   Potassium 6.1 (HH) 3.5 - 5.1 mmol/L   Chloride 94 (L) 101 - 111 mmol/L   BUN 51 (H) 6 - 20 mg/dL   Creatinine, Ser 4.40 (H) 0.61 - 1.24 mg/dL   Glucose, Bld 155 (H) 65 - 99 mg/dL   Calcium, Ion 1.00 (L) 1.13 - 1.30 mmol/L   TCO2 19 0 - 100 mmol/L   Hemoglobin 18.7 (H) 13.0 - 17.0 g/dL   HCT 55.0 (H) 39.0 - 52.0 %   Comment NOTIFIED PHYSICIAN   Blood Culture (routine x 2)     Status: None (Preliminary result)   Collection Time: 01/08/15 12:11 PM  Result Value Ref Range   Specimen Description BLOOD LEFT ANTECUBITAL    Special Requests BOTTLES DRAWN AEROBIC AND ANAEROBIC 5CC    Culture PENDING    Report Status PENDING   Magnesium     Status: None   Collection Time: 01/08/15  1:50 PM  Result Value Ref Range   Magnesium 1.9 1.7 - 2.4 mg/dL  Protime-INR     Status: Abnormal   Collection Time: 01/08/15  1:50 PM  Result Value Ref Range   Prothrombin Time 18.5 (H) 11.6 -  15.2 seconds   INR 1.54 (H) 0.00 - 1.49  Troponin I     Status: Abnormal   Collection Time: 01/08/15  1:50 PM  Result Value Ref Range   Troponin I 1.13 (HH) <0.031 ng/mL    Comment:        POSSIBLE MYOCARDIAL ISCHEMIA. SERIAL TESTING RECOMMENDED. CRITICAL RESULT CALLED TO, READ BACK BY AND VERIFIED WITH: LATANYA SATTERFIELD,RN AT 1519 01/08/15 BY ZBEECH.   I-Stat CG4 Lactic Acid, ED     Status: Abnormal   Collection Time: 01/08/15  1:56 PM  Result Value Ref Range   Lactic Acid, Venous 5.54 (HH) 0.5 - 2.0 mmol/L   Comment NOTIFIED PHYSICIAN   I-stat chem 8, ed     Status: Abnormal   Collection Time: 01/08/15  2:04 PM  Result Value Ref Range   Sodium 123 (L) 135 - 145 mmol/L   Potassium 6.6 (HH) 3.5 - 5.1 mmol/L   Chloride 91 (L) 101 - 111 mmol/L   BUN 46 (H)  6 - 20 mg/dL   Creatinine, Ser 3.70 (H) 0.61 - 1.24 mg/dL   Glucose, Bld 444 (H) 65 - 99 mg/dL   Calcium, Ion 0.68 (L) 1.13 - 1.30 mmol/L   TCO2 17 0 - 100 mmol/L   Hemoglobin 16.0 13.0 - 17.0 g/dL   HCT 47.0 39.0 - 52.0 %   Comment NOTIFIED PHYSICIAN   Prepare fresh frozen plasma     Status: None   Collection Time: 01/08/15  2:19 PM  Result Value Ref Range   Unit Number [id number]    Blood Component Type THW PLS APHR    Unit division A0    Status of Unit REL FROM [facility]    Transfusion Status OK TO TRANSFUSE    Unit Number [id number]    Blood Component Type THW PLS APHR    Unit division 00    Status of Unit REL FROM [facility]    Transfusion Status OK TO TRANSFUSE    Unit Number [id number]    Blood Component Type THW PLS APHR    Unit division 00    Status of Unit REL FROM [facility]    Transfusion Status OK TO TRANSFUSE    Unit Number [id number]    Blood Component Type THW PLS APHR    Unit division 00    Status of Unit REL FROM [facility]    Transfusion Status OK TO TRANSFUSE   I-STAT 7, (LYTES, BLD GAS, ICA, H+H)     Status: Abnormal   Collection Time: 01/08/15  2:35 PM  Result Value Ref Range   pH, Arterial  7.045 (LL) 7.350 - 7.450   pCO2 arterial 58.6 (HH) 35.0 - 45.0 mmHg   pO2, Arterial 71.0 (L) 80.0 - 100.0 mmHg   Bicarbonate 16.0 (L) 20.0 - 24.0 mEq/L   TCO2 18 0 - 100 mmol/L   O2 Saturation 84.0 %   Acid-base deficit 15.0 (H) 0.0 - 2.0 mmol/L   Sodium 135 135 - 145 mmol/L   Potassium 5.0 3.5 - 5.1 mmol/L   Calcium, Ion 1.26 1.13 - 1.30 mmol/L   HCT 44.0 39.0 - 52.0 %   Hemoglobin 15.0 13.0 - 17.0 g/dL   Sample type ARTERIAL   Glucose, capillary     Status: Abnormal   Collection Time: 01/08/15  2:52 PM  Result Value Ref Range   Glucose-Capillary 128 (H) 65 - 99 mg/dL  Basic metabolic panel     Status: Abnormal   Collection Time: 01/08/15  3:00 PM  Result Value Ref Range   Sodium 137 135 - 145 mmol/L   Potassium 3.5 3.5 - 5.1 mmol/L   Chloride 99 (L) 101 - 111 mmol/L   CO2 15 (L) 22 - 32 mmol/L   Glucose, Bld 271 (H) 65 - 99 mg/dL   BUN 46 (H) 6 - 20 mg/dL   Creatinine, Ser 4.39 (H) 0.61 - 1.24 mg/dL   Calcium 8.0 (L) 8.9 - 10.3 mg/dL   GFR calc non Af Amer 13 (L) >60 mL/min   GFR calc Af Amer 15 (L) >60 mL/min    Comment: (NOTE) The eGFR has been calculated using the CKD EPI equation. This calculation has not been validated in all clinical situations. eGFR's persistently <60 mL/min signify possible Chronic Kidney Disease.    Anion gap 23 (H) 5 - 15  MRSA PCR Screening     Status: None   Collection Time: 01/08/15  3:30 PM  Result Value Ref Range   MRSA by PCR NEGATIVE NEGATIVE  Comment:        The GeneXpert MRSA Assay (FDA approved for NASAL specimens only), is one component of a comprehensive MRSA colonization surveillance program. It is not intended to diagnose MRSA infection nor to guide or monitor treatment for MRSA infections.   Type and screen     Status: None   Collection Time: 01/08/15  4:00 PM  Result Value Ref Range   ABO/RH(D) A POS    Antibody Screen NEG    Sample Expiration 01/11/2015   ABO/Rh     Status: None   Collection Time: 01/08/15   4:00 PM  Result Value Ref Range   ABO/RH(D) A POS   Glucose, capillary     Status: Abnormal   Collection Time: 01/08/15  4:27 PM  Result Value Ref Range   Glucose-Capillary 169 (H) 65 - 99 mg/dL  Lactic acid, plasma     Status: Abnormal   Collection Time: 01/08/15  4:30 PM  Result Value Ref Range   Lactic Acid, Venous 9.3 (HH) 0.5 - 2.0 mmol/L    Comment: CRITICAL RESULT CALLED TO, READ BACK BY AND VERIFIED WITH: Sydell Axon 1749 01/08/2015 WBOND   I-STAT 3, arterial blood gas (G3+)     Status: Abnormal   Collection Time: 01/08/15  4:36 PM  Result Value Ref Range   pH, Arterial 7.218 (L) 7.350 - 7.450   pCO2 arterial 37.4 35.0 - 45.0 mmHg   pO2, Arterial 72.0 (L) 80.0 - 100.0 mmHg   Bicarbonate 15.3 (L) 20.0 - 24.0 mEq/L   TCO2 16 0 - 100 mmol/L   O2 Saturation 91.0 %   Acid-base deficit 12.0 (H) 0.0 - 2.0 mmol/L   Patient temperature 97.9 F    Collection site ARTERIAL LINE    Drawn by Operator    Sample type ARTERIAL   Urinalysis, Routine w reflex microscopic (not at [facility])     Status: Abnormal   Collection Time: 01/08/15  4:39 PM  Result Value Ref Range   Color, Urine AMBER (A) YELLOW    Comment: BIOCHEMICALS MAY BE AFFECTED BY COLOR   APPearance TURBID (A) CLEAR   Specific Gravity, Urine 1.020 1.005 - 1.030   pH 5.5 5.0 - 8.0   Glucose, UA 100 (A) NEGATIVE mg/dL   Hgb urine dipstick LARGE (A) NEGATIVE   Bilirubin Urine NEGATIVE NEGATIVE   Ketones, ur NEGATIVE NEGATIVE mg/dL   Protein, ur >300 (A) NEGATIVE mg/dL   Urobilinogen, UA 1.0 0.0 - 1.0 mg/dL   Nitrite NEGATIVE NEGATIVE   Leukocytes, UA NEGATIVE NEGATIVE  Urine culture     Status: None (Preliminary result)   Collection Time: 01/08/15  4:39 PM  Result Value Ref Range   Specimen Description URINE, CATHETERIZED    Special Requests NONE    Culture NO GROWTH < 24 HOURS    Report Status PENDING   Urine microscopic-add on     Status: Abnormal   Collection Time: 01/08/15  4:39 PM  Result Value Ref Range    Squamous Epithelial / LPF RARE RARE   WBC, UA 0-2 <3 WBC/hpf   RBC / HPF 7-10 <3 RBC/hpf   Casts GRANULAR CAST (A) NEGATIVE   Urine-Other MUCOUS PRESENT   Urine rapid drug screen (hosp performed)     Status: Abnormal   Collection Time: 01/08/15  4:40 PM  Result Value Ref Range   Opiates POSITIVE (A) NONE DETECTED   Cocaine NONE DETECTED NONE DETECTED   Benzodiazepines POSITIVE (A) NONE DETECTED   Amphetamines NONE  DETECTED NONE DETECTED   Tetrahydrocannabinol NONE DETECTED NONE DETECTED   Barbiturates NONE DETECTED NONE DETECTED    Comment:        DRUG SCREEN FOR MEDICAL PURPOSES ONLY.  IF CONFIRMATION IS NEEDED FOR ANY PURPOSE, NOTIFY LAB WITHIN 5 DAYS.        LOWEST DETECTABLE LIMITS FOR URINE DRUG SCREEN Drug Class       Cutoff (ng/mL) Amphetamine      1000 Barbiturate      200 Benzodiazepine   174 Tricyclics       944 Opiates          300 Cocaine          300 THC              50   Troponin I (q 6hr x 3)     Status: Abnormal   Collection Time: 01/08/15  7:00 PM  Result Value Ref Range   Troponin I 15.07 (HH) <0.031 ng/mL    Comment:        POSSIBLE MYOCARDIAL ISCHEMIA. SERIAL TESTING RECOMMENDED. CRITICAL VALUE NOTED.  VALUE IS CONSISTENT WITH PREVIOUSLY REPORTED AND CALLED VALUE.   Glucose, capillary     Status: Abnormal   Collection Time: 01/08/15  8:03 PM  Result Value Ref Range   Glucose-Capillary 306 (H) 65 - 99 mg/dL  Blood gas, arterial     Status: Abnormal   Collection Time: 01/08/15  8:12 PM  Result Value Ref Range   FIO2 1.00    Delivery systems VENTILATOR    Mode PRESSURE REGULATED VOLUME CONTROL    VT 670 mL   LHR 25 resp/min   Peep/cpap 5.0 cm H20   pH, Arterial 7.317 (L) 7.350 - 7.450   pCO2 arterial 31.2 (L) 35.0 - 45.0 mmHg   pO2, Arterial 137 (H) 80.0 - 100.0 mmHg   Bicarbonate 15.5 (L) 20.0 - 24.0 mEq/L   TCO2 16.5 0 - 100 mmol/L   Acid-base deficit 9.4 (H) 0.0 - 2.0 mmol/L   O2 Saturation 97.8 %   Patient temperature 98.6     Collection site ARTERIAL LINE    Drawn by DRAWN BY RN    Sample type ARTERIAL DRAW   Glucose, capillary     Status: Abnormal   Collection Time: 01/08/15 11:38 PM  Result Value Ref Range   Glucose-Capillary 232 (H) 65 - 99 mg/dL   Comment 1 Notify RN    Comment 2 Document in Chart   Blood gas, arterial     Status: Abnormal   Collection Time: 01/09/15 12:08 AM  Result Value Ref Range   FIO2 0.80    Delivery systems VENTILATOR    Mode PRESSURE REGULATED VOLUME CONTROL    VT 670 mL   LHR 25 resp/min   Peep/cpap 8.0 cm H20   pH, Arterial 7.390 7.350 - 7.450   pCO2 arterial 35.6 35.0 - 45.0 mmHg   pO2, Arterial 118 (H) 80.0 - 100.0 mmHg   Bicarbonate 21.0 20.0 - 24.0 mEq/L   TCO2 22.1 0 - 100 mmol/L   Acid-base deficit 3.1 (H) 0.0 - 2.0 mmol/L   O2 Saturation 97.3 %   Patient temperature 99.3    Collection site ARTERIAL LINE    Drawn by [id number]    Sample type ARTERIAL DRAW   Lactic acid, plasma     Status: Abnormal   Collection Time: 01/09/15  1:30 AM  Result Value Ref Range   Lactic Acid, Venous 3.5 (HH) 0.5 - 2.0  mmol/L    Comment: CRITICAL RESULT CALLED TO, READ BACK BY AND VERIFIED WITH: HARDUK G,RN 01/09/15 0220 WAYK   Basic metabolic panel     Status: Abnormal   Collection Time: 01/09/15  1:48 AM  Result Value Ref Range   Sodium 134 (L) 135 - 145 mmol/L   Potassium 3.8 3.5 - 5.1 mmol/L   Chloride 98 (L) 101 - 111 mmol/L   CO2 20 (L) 22 - 32 mmol/L   Glucose, Bld 113 (H) 65 - 99 mg/dL   BUN 48 (H) 6 - 20 mg/dL   Creatinine, Ser 5.34 (H) 0.61 - 1.24 mg/dL   Calcium 7.6 (L) 8.9 - 10.3 mg/dL   GFR calc non Af Amer 10 (L) >60 mL/min   GFR calc Af Amer 12 (L) >60 mL/min    Comment: (NOTE) The eGFR has been calculated using the CKD EPI equation. This calculation has not been validated in all clinical situations. eGFR's persistently <60 mL/min signify possible Chronic Kidney Disease.    Anion gap 16 (H) 5 - 15  Magnesium     Status: Abnormal   Collection Time: 01/09/15   1:48 AM  Result Value Ref Range   Magnesium 2.5 (H) 1.7 - 2.4 mg/dL  Phosphorus     Status: None   Collection Time: 01/09/15  1:48 AM  Result Value Ref Range   Phosphorus 3.7 2.5 - 4.6 mg/dL  CBC     Status: Abnormal   Collection Time: 01/09/15  1:48 AM  Result Value Ref Range   WBC 27.1 (H) 4.0 - 10.5 K/uL   RBC 5.70 4.22 - 5.81 MIL/uL   Hemoglobin 18.0 (H) 13.0 - 17.0 g/dL   HCT 48.9 39.0 - 52.0 %   MCV 85.8 78.0 - 100.0 fL   MCH 31.6 26.0 - 34.0 pg   MCHC 36.8 (H) 30.0 - 36.0 g/dL   RDW 13.1 11.5 - 15.5 %   Platelets 208 150 - 400 K/uL  Troponin I (q 6hr x 3)     Status: Abnormal   Collection Time: 01/09/15  1:49 AM  Result Value Ref Range   Troponin I >65.00 (HH) <0.031 ng/mL    Comment:        POSSIBLE MYOCARDIAL ISCHEMIA. SERIAL TESTING RECOMMENDED. CRITICAL RESULT CALLED TO, READ BACK BY AND VERIFIED WITH: G. HARDUK RN [id number] GREEN R   Heparin level (unfractionated)     Status: Abnormal   Collection Time: 01/09/15  1:49 AM  Result Value Ref Range   Heparin Unfractionated 0.18 (L) 0.30 - 0.70 IU/mL    Comment:        IF HEPARIN RESULTS ARE BELOW EXPECTED VALUES, AND PATIENT DOSAGE HAS BEEN CONFIRMED, SUGGEST FOLLOW UP TESTING OF ANTITHROMBIN III LEVELS.   Glucose, capillary     Status: None   Collection Time: 01/09/15  3:53 AM  Result Value Ref Range   Glucose-Capillary 86 65 - 99 mg/dL  Glucose, capillary     Status: None   Collection Time: 01/09/15  3:58 AM  Result Value Ref Range   Glucose-Capillary 97 65 - 99 mg/dL  Blood gas, arterial     Status: Abnormal   Collection Time: 01/09/15  4:10 AM  Result Value Ref Range   FIO2 0.70    Delivery systems VENTILATOR    Mode PRESSURE REGULATED VOLUME CONTROL    VT 670 mL   LHR 25 resp/min   Peep/cpap 8.0 cm H20   pH, Arterial 7.437 7.350 - 7.450  pCO2 arterial 32.4 (L) 35.0 - 45.0 mmHg   pO2, Arterial 99.8 80.0 - 100.0 mmHg   Bicarbonate 21.7 20.0 - 24.0 mEq/L   TCO2 22.7 0 - 100 mmol/L    Acid-base deficit 2.0 0.0 - 2.0 mmol/L   O2 Saturation 96.7 %   Patient temperature 97.4    Collection site ARTERIAL LINE    Drawn by [id number]    Sample type ARTERIAL DRAW   Glucose, capillary     Status: Abnormal   Collection Time: 01/09/15  8:02 AM  Result Value Ref Range   Glucose-Capillary 101 (H) 65 - 99 mg/dL  Troponin I (q 6hr x 3)     Status: Abnormal   Collection Time: 01/09/15  9:14 AM  Result Value Ref Range   Troponin I >65.00 (HH) <0.031 ng/mL    Comment:        POSSIBLE MYOCARDIAL ISCHEMIA. SERIAL TESTING RECOMMENDED. CRITICAL VALUE NOTED.  VALUE IS CONSISTENT WITH PREVIOUSLY REPORTED AND CALLED VALUE.   Procalcitonin - Baseline     Status: None   Collection Time: 01/09/15  9:45 AM  Result Value Ref Range   Procalcitonin 40.68 ng/mL    Comment:        Interpretation: PCT >= 10 ng/mL: Important systemic inflammatory response, almost exclusively due to severe bacterial sepsis or septic shock. (NOTE)         ICU PCT Algorithm               Non ICU PCT Algorithm    ----------------------------     ------------------------------         PCT < 0.25 ng/mL                 PCT < 0.1 ng/mL     Stopping of antibiotics            Stopping of antibiotics       strongly encouraged.               strongly encouraged.    ----------------------------     ------------------------------       PCT level decrease by               PCT < 0.25 ng/mL       >= 80% from peak PCT       OR PCT 0.25 - 0.5 ng/mL          Stopping of antibiotics                                             encouraged.     Stopping of antibiotics           encouraged.    ----------------------------     ------------------------------       PCT level decrease by              PCT >= 0.25 ng/mL       < 80% from peak PCT        AND PCT >= 0.5 ng/mL             Continuing antibiotics                                              encouraged.  Continuing antibiotics            encouraged.     ----------------------------     ------------------------------     PCT level increase compared          PCT > 0.5 ng/mL         with peak PCT AND          PCT >= 0.5 ng/mL             Escalation of antibiotics                                          strongly encouraged.      Escalation of antibiotics        strongly encouraged.   Heparin level (unfractionated)     Status: None   Collection Time: 01/09/15 11:32 AM  Result Value Ref Range   Heparin Unfractionated 0.35 0.30 - 0.70 IU/mL    Comment:        IF HEPARIN RESULTS ARE BELOW EXPECTED VALUES, AND PATIENT DOSAGE HAS BEEN CONFIRMED, SUGGEST FOLLOW UP TESTING OF ANTITHROMBIN III LEVELS.   Glucose, capillary     Status: Abnormal   Collection Time: 01/09/15 12:02 PM  Result Value Ref Range   Glucose-Capillary 114 (H) 65 - 99 mg/dL   US Renal Port  01/08/2015   CLINICAL DATA:  Acute renal failure.  EXAM: RENAL / URINARY TRACT ULTRASOUND COMPLETE  COMPARISON:  05/16/2012  FINDINGS: Right Kidney:  Length: 12.3 cm. The right kidney is more echogenic than the adjacent liver. Poor corticomedullary differentiation. No stones, mass, or hydronephrosis.  Left Kidney:  Length: 12.3 cm. Mildly echogenic left kidney. Poor corticomedullary differentiation. No stones, mass, or hydronephrosis identified. Poor delineation of the upper pole of the kidney due to poor sonic window.  Bladder:  Empty; Foley catheter in place.  IMPRESSION: 1. Bilateral renal echogenicity with relatively normal renal size, and size not appreciably less than on 05/16/2012. Appearance could be from chronic medical renal disease, or late acute or chronic glomerulonephritis. Renal biopsy can help in further workup if clinically warranted.   Electronically Signed   By: Van Clines M.D.   On: 01/08/2015 17:12   Dg Chest Port 1 View  01/09/2015   CLINICAL DATA:  Respiratory failure.  EXAM: PORTABLE CHEST - 1 VIEW  COMPARISON:  01/08/2015.  FINDINGS: Endotracheal tube and NG  tube in good anatomic position. Cardiomegaly with pulmonary vascular prominence and diffuse interstitial prominence with bilateral effusions noted. Findings consistent with congestive heart failure. No pneumothorax. Prior cervical spine fusion.  IMPRESSION: 1. Lines and tubes in stable position. 2. Cardiomegaly with pulmonary venous congestion, bilateral interstitial prominence, and left-sided pleural effusion consistent with congestive heart failure.   Electronically Signed   By: Marcello Moores  Register   On: 01/09/2015 07:16   Dg Chest Portable 1 View  01/08/2015   CLINICAL DATA:  Intubation.  EXAM: PORTABLE CHEST - 1 VIEW  COMPARISON:  Earlier film, same date.  FINDINGS: The endotracheal to is 4 cm above the carina. The NG tube is coursing down the esophagus and into the stomach. The heart is enlarged but stable. Prominent mediastinal and hilar contours are unchanged. External pacer paddles are noted. The lungs are grossly clear and stable. Persistent bibasilar atelectasis.  IMPRESSION: Support apparatus in good position without complicating features.  Stable cardiac enlargement. No significant pulmonary findings. Persistent bibasilar  atelectasis.   Electronically Signed   By: Marijo Sanes M.D.   On: 01/08/2015 17:05   Dg Chest Port 1 View  01/08/2015   CLINICAL DATA:  Shortness of breath, cyanosis and abdominal pain. Femoral line placement.  EXAM: PORTABLE CHEST - 1 VIEW  COMPARISON:  01/08/2015 and 05/16/2012.  FINDINGS: 1328 hr. Two views obtained semi erect. Cardiomegaly appears unchanged. There is mild atelectasis at both lung bases. No evidence of pneumoperitoneum, pneumothorax or significant pleural effusion. Postsurgical changes are present status post lower cervical fusion. Telemetry leads overlie the chest.  IMPRESSION: Stable cardiomegaly and bibasilar atelectasis.  No new findings.   Electronically Signed   By: Richardean Sale M.D.   On: 01/08/2015 13:50   Dg Chest Port 1 View  01/08/2015    CLINICAL DATA:  Confusion and dyspnea since this morning.  EXAM: PORTABLE CHEST - 1 VIEW  COMPARISON:  05/16/2012.  FINDINGS: The heart is mildly enlarged but stable. The mediastinal and hilar contours are slightly prominent but unchanged. Mild vascular congestion but no edema, infiltrates or effusions. The bony thorax is intact.  IMPRESSION: Stable cardiac enlargement and mild vascular congestion. No pulmonary edema or pleural effusions.   Electronically Signed   By: Marijo Sanes M.D.   On: 01/08/2015 11:42   Dg Abd Portable 1v  01/08/2015   CLINICAL DATA:  Abdominal pain  EXAM: PORTABLE ABDOMEN - 1 VIEW  COMPARISON:  CT abdomen and pelvis April 25, 2012  FINDINGS: There is moderate stool in the colon. No obstruction is appreciable on this supine examination. No free air is seen on this supine examination. There is postoperative change at L5 and S1. There is a right femoral catheter with the tip overlying the mid sacrum. Catheter tip is likely in the right external iliac vein region.  IMPRESSION: Right frontal region catheter with tip overlying the mid right sacrum. No obstruction or free air is seen on this supine examination. Note that evaluation for free air is limited on supine only examination.   Electronically Signed   By: Lowella Grip III M.D.   On: 01/08/2015 13:42    Assessment:  59 yo male with hx of HTN, HLD, here after being found unresponsive, found to be in Shock likely 2/2 to sepsis   1. Shock - likely obstructive shock from possible PE (RA and RV dilated, RV hypocontractile on echo), but could also be from possible sepsis (PCT high, on abx) - on heparin drip, cardiology on board. CT angio deferred until renal improvement. Remains on pressors and amio drip. 2. Oliguric AKI on CKD 2 - likely 2/2 to shock.  Renal u/s shows chronic medial renal disease. 3. Hypoxic resp failure - likely 2/2 to PE, also does have pulm edema and pulm venous congestion. Remains on vent. 3. Gap metabolic  acidosis - likely 2/2 to lactic acidosis in the setting of shock. Improved with bicarb gtt. 4. Trop >60.0 - cards following, thought to be 2/2 to CPR. No CP currently.   Plan:  1. Supportive care with pressors and abx. Renal prognosis is guarded, may improve with improvement of his perfusion. 2. Trial of lasix to help with pulm edema. Monitor UOP closely.    Ahmed, Tasrif 01/09/2015, 1:31 PM   Renal Attending: I agree with the note above as articulated.  Pt history was reviewed and patient examined by me. Will try to diurese and allow hemodynamics to stabilize.  If needed we can support with CRRT to optimize fluids and electrolytes.  Jared [name]

## 2015-01-09 NOTE — Progress Notes (Signed)
ANTICOAGULATION CONSULT NOTE - Follow Up Consult  Pharmacy Consult for Heparin  Indication: Rule out PE  Allergies  Allergen Reactions  . Morphine And Related Anaphylaxis and Shortness Of Breath  . Montelukast Other (See Comments)    unknown  . Neurontin [Gabapentin] Other (See Comments)    delusional  . Wellbutrin [Bupropion] Palpitations    Insomnia     Vital Signs: Temp: 101.1 F (38.4 C) (08/19 1555) Temp Source: Oral (08/19 1555) BP: 95/70 mmHg (08/19 1600) Pulse Rate: 65 (08/19 1600)  Labs:  Recent Labs  01/08/15 1100  01/08/15 1350 01/08/15 1404 01/08/15 1435 01/08/15 1500  01/09/15 0148 01/09/15 0149 01/09/15 0914 01/09/15 1132 01/09/15 1512 01/09/15 1630  HGB 17.1*  < >  --  16.0 15.0  --   --  18.0*  --   --   --   --  15.6  HCT 48.8  < >  --  47.0 44.0  --   --  48.9  --   --   --   --  43.1  PLT 186  --   --   --   --   --   --  208  --   --   --   --  141*  LABPROT  --   --  18.5*  --   --   --   --   --   --   --   --   --   --   INR  --   --  1.54*  --   --   --   --   --   --   --   --   --   --   HEPARINUNFRC  --   --   --   --   --   --   --   --  0.18*  --  0.35  --   --   CREATININE 4.95*  < >  --  3.70*  --  4.39*  --  5.34*  --   --   --   --   --   TROPONINI 0.47*  --  1.13*  --   --   --   < >  --  >65.00* >65.00*  --  >65.00*  --   < > = values in this interval not displayed.  Estimated Creatinine Clearance: 20.4 mL/min (by C-G formula based on Cr of 5.34).   Assessment: Surgery on 8/18 wound vac in place. On heparin for PE. Nurse noted increased bleeding from wound vac/surgery site. Elink MD stopping heparin for now and getting new Hgb level. AM labs Hgb 18, plt wnl  HL 0.35 on 2100 u/hr  Goal of Therapy:  Heparin level 0.3-0.7 units/ml Monitor platelets by anticoagulation protocol: Yes   Plan:  -D/C heparin and levels -reconsult pharmacy if heparin restarted  Remi Haggard, PharmD Clinical Pharmacist- Resident Pager:  [phone]  Remi Haggard 01/09/2015,5:05 PM

## 2015-01-10 ENCOUNTER — Inpatient Hospital Stay (HOSPITAL_COMMUNITY): Payer: Medicare Other

## 2015-01-10 DIAGNOSIS — N179 Acute kidney failure, unspecified: Secondary | ICD-10-CM | POA: Diagnosis present

## 2015-01-10 DIAGNOSIS — J9601 Acute respiratory failure with hypoxia: Secondary | ICD-10-CM | POA: Diagnosis present

## 2015-01-10 DIAGNOSIS — R570 Cardiogenic shock: Secondary | ICD-10-CM | POA: Diagnosis present

## 2015-01-10 LAB — CBC
HCT: 41.2 % (ref 39.0–52.0)
Hemoglobin: 14.5 g/dL (ref 13.0–17.0)
MCH: 31.3 pg (ref 26.0–34.0)
MCHC: 35.2 g/dL (ref 30.0–36.0)
MCV: 89 fL (ref 78.0–100.0)
PLATELETS: 161 10*3/uL (ref 150–400)
RBC: 4.63 MIL/uL (ref 4.22–5.81)
RDW: 13.5 % (ref 11.5–15.5)
WBC: 23 10*3/uL — AB (ref 4.0–10.5)

## 2015-01-10 LAB — BLOOD GAS, ARTERIAL
ACID-BASE DEFICIT: 2.4 mmol/L — AB (ref 0.0–2.0)
Bicarbonate: 22.2 mEq/L (ref 20.0–24.0)
Drawn by: 405301
FIO2: 0.5
LHR: 16 {breaths}/min
MECHVT: 670 mL
O2 Saturation: 96.7 %
PATIENT TEMPERATURE: 100
PEEP/CPAP: 5 cmH2O
PH ART: 7.348 — AB (ref 7.350–7.450)
PO2 ART: 106 mmHg — AB (ref 80.0–100.0)
TCO2: 23.4 mmol/L (ref 0–100)
pCO2 arterial: 41.9 mmHg (ref 35.0–45.0)

## 2015-01-10 LAB — COMPREHENSIVE METABOLIC PANEL
ALBUMIN: 2.1 g/dL — AB (ref 3.5–5.0)
ALT: 3366 U/L — AB (ref 17–63)
AST: 2907 U/L — AB (ref 15–41)
Alkaline Phosphatase: 74 U/L (ref 38–126)
Anion gap: 18 — ABNORMAL HIGH (ref 5–15)
BUN: 71 mg/dL — AB (ref 6–20)
CHLORIDE: 96 mmol/L — AB (ref 101–111)
CO2: 21 mmol/L — AB (ref 22–32)
CREATININE: 8.72 mg/dL — AB (ref 0.61–1.24)
Calcium: 6.8 mg/dL — ABNORMAL LOW (ref 8.9–10.3)
GFR calc Af Amer: 7 mL/min — ABNORMAL LOW (ref >60)
GFR calc non Af Amer: 6 mL/min — ABNORMAL LOW (ref >60)
GLUCOSE: 127 mg/dL — AB (ref 65–99)
Potassium: 3.8 mmol/L (ref 3.5–5.1)
SODIUM: 135 mmol/L (ref 135–145)
Total Bilirubin: 1.7 mg/dL — ABNORMAL HIGH (ref 0.3–1.2)
Total Protein: 3.7 g/dL — ABNORMAL LOW (ref 6.5–8.1)

## 2015-01-10 LAB — CARBOXYHEMOGLOBIN
Carboxyhemoglobin: 1 % (ref 0.5–1.5)
METHEMOGLOBIN: 1.6 % — AB (ref 0.0–1.5)
O2 Saturation: 76.4 %
TOTAL HEMOGLOBIN: 14.6 g/dL (ref 13.5–18.0)

## 2015-01-10 LAB — URINE CULTURE: Culture: NO GROWTH

## 2015-01-10 LAB — GLUCOSE, CAPILLARY
GLUCOSE-CAPILLARY: 126 mg/dL — AB (ref 65–99)
GLUCOSE-CAPILLARY: 133 mg/dL — AB (ref 65–99)
Glucose-Capillary: 128 mg/dL — ABNORMAL HIGH (ref 65–99)
Glucose-Capillary: 135 mg/dL — ABNORMAL HIGH (ref 65–99)
Glucose-Capillary: 146 mg/dL — ABNORMAL HIGH (ref 65–99)
Glucose-Capillary: 147 mg/dL — ABNORMAL HIGH (ref 65–99)

## 2015-01-10 LAB — LACTIC ACID, PLASMA: Lactic Acid, Venous: 1.5 mmol/L (ref 0.5–2.0)

## 2015-01-10 LAB — TRIGLYCERIDES: TRIGLYCERIDES: 324 mg/dL — AB (ref <150)

## 2015-01-10 LAB — PROCALCITONIN: PROCALCITONIN: 37.87 ng/mL

## 2015-01-10 LAB — MAGNESIUM: Magnesium: 2.2 mg/dL (ref 1.7–2.4)

## 2015-01-10 LAB — PHOSPHORUS: Phosphorus: 4.6 mg/dL (ref 2.5–4.6)

## 2015-01-10 MED ORDER — PRISMASOL BGK 4/2.5 32-4-2.5 MEQ/L IV SOLN
INTRAVENOUS | Status: DC
  Filled 2015-01-10: qty 5000

## 2015-01-10 MED ORDER — VECURONIUM BROMIDE 10 MG IV SOLR
10.0000 mg | Freq: Once | INTRAVENOUS | Status: AC
  Administered 2015-01-10: 10 mg via INTRAVENOUS

## 2015-01-10 MED ORDER — PRISMASOL BGK 4/2.5 32-4-2.5 MEQ/L IV SOLN
INTRAVENOUS | Status: DC
  Administered 2015-01-10 – 2015-01-14 (×22): via INTRAVENOUS_CENTRAL
  Filled 2015-01-10 (×32): qty 5000

## 2015-01-10 MED ORDER — EPINEPHRINE HCL 1 MG/ML IJ SOLN
0.5000 ug/min | INTRAVENOUS | Status: DC
  Filled 2015-01-10: qty 4

## 2015-01-10 MED ORDER — VECURONIUM BROMIDE 10 MG IV SOLR
INTRAVENOUS | Status: AC
  Filled 2015-01-10: qty 30

## 2015-01-10 MED ORDER — CHLORHEXIDINE GLUCONATE 0.12% ORAL RINSE (MEDLINE KIT)
15.0000 mL | Freq: Two times a day (BID) | OROMUCOSAL | Status: DC
  Administered 2015-01-10 – 2015-02-09 (×44): 15 mL via OROMUCOSAL

## 2015-01-10 MED ORDER — VECURONIUM BROMIDE 10 MG IV SOLR
5.0000 mg | Freq: Once | INTRAVENOUS | Status: AC
  Administered 2015-01-10: 5 mg via INTRAVENOUS

## 2015-01-10 MED ORDER — PHENYLEPHRINE 40 MCG/ML (10ML) SYRINGE FOR IV PUSH (FOR BLOOD PRESSURE SUPPORT)
PREFILLED_SYRINGE | INTRAVENOUS | Status: AC
  Filled 2015-01-10: qty 10

## 2015-01-10 MED ORDER — SODIUM BICARBONATE 8.4 % IV SOLN
100.0000 meq | Freq: Once | INTRAVENOUS | Status: AC
  Administered 2015-01-10: 100 meq via INTRAVENOUS
  Filled 2015-01-10: qty 100

## 2015-01-10 MED ORDER — HEPARIN SODIUM (PORCINE) 1000 UNIT/ML DIALYSIS: 1000.0000 [IU] | INTRAMUSCULAR | Status: DC | PRN

## 2015-01-10 MED ORDER — PRISMASOL BGK 4/2.5 32-4-2.5 MEQ/L IV SOLN
INTRAVENOUS | Status: DC
  Administered 2015-01-10 – 2015-01-13 (×5): via INTRAVENOUS_CENTRAL
  Filled 2015-01-10 (×5): qty 5000

## 2015-01-10 MED ORDER — VANCOMYCIN HCL 10 G IV SOLR
1250.0000 mg | INTRAVENOUS | Status: DC
  Administered 2015-01-10: 1250 mg via INTRAVENOUS
  Filled 2015-01-10 (×2): qty 1250

## 2015-01-10 MED ORDER — PROPOFOL 1000 MG/100ML IV EMUL
INTRAVENOUS | Status: AC
  Administered 2015-01-10: 80 ug/kg/min via INTRAVENOUS
  Filled 2015-01-10: qty 100

## 2015-01-10 MED ORDER — PRISMASOL BGK 4/2.5 32-4-2.5 MEQ/L IV SOLN
INTRAVENOUS | Status: DC
  Administered 2015-01-10 – 2015-01-13 (×3): via INTRAVENOUS_CENTRAL
  Filled 2015-01-10 (×5): qty 5000

## 2015-01-10 MED ORDER — HEPARIN SODIUM (PORCINE) 1000 UNIT/ML DIALYSIS
1000.0000 [IU] | INTRAMUSCULAR | Status: DC | PRN
  Administered 2015-01-11: 4000 [IU] via INTRAVENOUS_CENTRAL
  Administered 2015-01-12: 2400 [IU] via INTRAVENOUS_CENTRAL
  Filled 2015-01-10: qty 3
  Filled 2015-01-10: qty 4

## 2015-01-10 MED ORDER — VECURONIUM BROMIDE 10 MG IV SOLR
INTRAVENOUS | Status: AC
  Filled 2015-01-10: qty 10

## 2015-01-10 MED ORDER — VECURONIUM BROMIDE 10 MG IV SOLR
INTRAVENOUS | Status: AC
  Administered 2015-01-10: 5 mg via INTRAVENOUS
  Filled 2015-01-10: qty 10

## 2015-01-10 MED ORDER — PIPERACILLIN-TAZOBACTAM IN DEX 2-0.25 GM/50ML IV SOLN
2.2500 g | Freq: Four times a day (QID) | INTRAVENOUS | Status: DC
  Administered 2015-01-10 – 2015-01-15 (×21): 2.25 g via INTRAVENOUS
  Filled 2015-01-10 (×24): qty 50

## 2015-01-10 MED ORDER — HEPARIN (PORCINE) 2000 UNITS/L FOR CRRT
INTRAVENOUS_CENTRAL | Status: DC | PRN
  Filled 2015-01-10: qty 1000

## 2015-01-10 MED ORDER — ANTISEPTIC ORAL RINSE SOLUTION (CORINZ)
7.0000 mL | OROMUCOSAL | Status: DC
  Administered 2015-01-10 – 2015-01-12 (×21): 7 mL via OROMUCOSAL

## 2015-01-10 MED ORDER — PROPOFOL 1000 MG/100ML IV EMUL
5.0000 ug/kg/min | INTRAVENOUS | Status: DC
  Administered 2015-01-10 (×2): 50 ug/kg/min via INTRAVENOUS
  Administered 2015-01-10: 45 ug/kg/min via INTRAVENOUS
  Administered 2015-01-10 (×2): 80 ug/kg/min via INTRAVENOUS
  Administered 2015-01-10 (×4): 50 ug/kg/min via INTRAVENOUS
  Administered 2015-01-11 (×2): 30 ug/kg/min via INTRAVENOUS
  Filled 2015-01-10 (×11): qty 100

## 2015-01-10 MED ORDER — LORAZEPAM 2 MG/ML IJ SOLN: 2.0000 mg | Freq: Once | INTRAMUSCULAR | Status: AC

## 2015-01-10 NOTE — Procedures (Signed)
Central Venous Catheter Insertion Procedure Note Jared [name] [id number] [birth year]  Procedure: Insertion of Central Venous Catheter Indications: Assessment of intravascular volume and Drug and/or fluid administration  Procedure Details Consent: Risks of procedure as well as the alternatives and risks of each were explained to the (patient/caregiver).  Consent for procedure obtained. Time Out: Verified patient identification, verified procedure, site/side was marked, verified correct patient position, special equipment/implants available, medications/allergies/relevent history reviewed, required imaging and test results available.  Performed  Maximum sterile technique was used including antiseptics, cap, gloves, gown, hand hygiene, mask and sheet. Skin prep: Chlorhexidine; local anesthetic administered A antimicrobial bonded/coated triple lumen catheter was placed in the left internal jugular vein using the Seldinger technique.  Ultrasound was used to verify the patency of the vein and for real time needle guidance.  Evaluation Blood flow good Complications: No apparent complications Patient did tolerate procedure well. Chest X-ray ordered to verify placement.  CXR: pending.  MCQUAID, DOUGLAS 01/10/2015, 1:18 PM

## 2015-01-10 NOTE — Progress Notes (Signed)
2 Days Post-Op  Subjective: Noted events overnight. Patient essentially eviscerated because of inadequate sedation.  VAC changed at bedside with increased sedation and intermittent paralytic.  Patient is currently on Fentanyl and Propofol.    Patient is off Heparin gtt.  Currently INR is elevated.  Still on Levophed, Vasopress, amio  Objective: Vital signs in last 24 hours: Temp:  [99.2 F (37.3 C)-101.1 F (38.4 C)] 99.2 F (37.3 C) (08/20 0800) Pulse Rate:  [57-96] 57 (08/20 0730) Resp:  [12-25] 21 (08/20 0730) BP: (82-124)/(39-100) 103/46 mmHg (08/20 0700) SpO2:  [93 %-98 %] 98 % (08/20 0730) Arterial Line BP: (93-137)/(38-68) 107/46 mmHg (08/20 0730) FiO2 (%):  [50 %-60 %] 50 % (08/20 0400) Weight:  [135.9 kg (299 lb 9.7 oz)-138.5 kg (305 lb 5.4 oz)] 135.9 kg (299 lb 9.7 oz) (08/20 0515)    Intake/Output from previous day: 08/19 0701 - 08/20 0700 In: 3163.3 [I.V.:2742.8; NG/GT:60; IV Piggyback:360.5] Out: 1125 [Urine:25; Emesis/NG output:125; Drains:975] Intake/Output this shift: Total I/O In: 189.9 [I.V.:123.9; IV Piggyback:66] Out: -   General appearance: intubated, sedated; no response to stimuli GI: Distended; quiet VAC with good seal; no blood pooling; sponge is flat  Lab Results:   Recent Labs  01/09/15 1630 01/10/15 0330  WBC 19.9* 23.0*  HGB 15.6 14.5  HCT 43.1 41.2  PLT 141* 161   BMET  Recent Labs  01/09/15 1630 01/10/15 0330  NA 137 135  K 3.6 3.8  CL 96* 96*  CO2 24 21*  GLUCOSE 106* 127*  BUN 61* 71*  CREATININE 7.58* 8.72*  CALCIUM 7.1* 6.8*   PT/INR  Recent Labs  01/08/15 1350 01/09/15 1630  LABPROT 18.5* 24.6*  INR 1.54* 2.24*   ABG  Recent Labs  01/09/15 1901 01/10/15 0408  PHART 7.339* 7.348*  HCO3 24.0 22.2    Studies/Results: US Renal Port  01/08/2015   CLINICAL DATA:  Acute renal failure.  EXAM: RENAL / URINARY TRACT ULTRASOUND COMPLETE  COMPARISON:  05/16/2012  FINDINGS: Right Kidney:  Length: 12.3 cm. The  right kidney is more echogenic than the adjacent liver. Poor corticomedullary differentiation. No stones, mass, or hydronephrosis.  Left Kidney:  Length: 12.3 cm. Mildly echogenic left kidney. Poor corticomedullary differentiation. No stones, mass, or hydronephrosis identified. Poor delineation of the upper pole of the kidney due to poor sonic window.  Bladder:  Empty; Foley catheter in place.  IMPRESSION: 1. Bilateral renal echogenicity with relatively normal renal size, and size not appreciably less than on 05/16/2012. Appearance could be from chronic medical renal disease, or late acute or chronic glomerulonephritis. Renal biopsy can help in further workup if clinically warranted.   Electronically Signed   By: Gaylyn Rong M.D.   On: 01/08/2015 17:12   Dg Chest Port 1 View  01/10/2015   CLINICAL DATA:  Respiratory failure  EXAM: PORTABLE CHEST - 1 VIEW  COMPARISON:  01/09/2015  FINDINGS: Cardiac shadow remains enlarged. A nasogastric catheter and endotracheal tube are again seen and stable. Bibasilar atelectasis is noted with changes suggestive of small effusions bilaterally.  IMPRESSION: Increasing bibasilar atelectasis and small effusions.   Electronically Signed   By: Alcide Clever M.D.   On: 01/10/2015 07:35   Dg Chest Port 1 View  01/09/2015   CLINICAL DATA:  Respiratory failure.  EXAM: PORTABLE CHEST - 1 VIEW  COMPARISON:  01/08/2015.  FINDINGS: Endotracheal tube and NG tube in good anatomic position. Cardiomegaly with pulmonary vascular prominence and diffuse interstitial prominence with bilateral effusions noted. Findings consistent  with congestive heart failure. No pneumothorax. Prior cervical spine fusion.  IMPRESSION: 1. Lines and tubes in stable position. 2. Cardiomegaly with pulmonary venous congestion, bilateral interstitial prominence, and left-sided pleural effusion consistent with congestive heart failure.   Electronically Signed   By: Maisie Fus  Register   On: 01/09/2015 07:16   Dg  Chest Portable 1 View  01/08/2015   CLINICAL DATA:  Intubation.  EXAM: PORTABLE CHEST - 1 VIEW  COMPARISON:  Earlier film, same date.  FINDINGS: The endotracheal to is 4 cm above the carina. The NG tube is coursing down the esophagus and into the stomach. The heart is enlarged but stable. Prominent mediastinal and hilar contours are unchanged. External pacer paddles are noted. The lungs are grossly clear and stable. Persistent bibasilar atelectasis.  IMPRESSION: Support apparatus in good position without complicating features.  Stable cardiac enlargement. No significant pulmonary findings. Persistent bibasilar atelectasis.   Electronically Signed   By: Rudie Meyer M.D.   On: 01/08/2015 17:05   Dg Chest Port 1 View  01/08/2015   CLINICAL DATA:  Shortness of breath, cyanosis and abdominal pain. Femoral line placement.  EXAM: PORTABLE CHEST - 1 VIEW  COMPARISON:  01/08/2015 and 05/16/2012.  FINDINGS: 1328 hr. Two views obtained semi erect. Cardiomegaly appears unchanged. There is mild atelectasis at both lung bases. No evidence of pneumoperitoneum, pneumothorax or significant pleural effusion. Postsurgical changes are present status post lower cervical fusion. Telemetry leads overlie the chest.  IMPRESSION: Stable cardiomegaly and bibasilar atelectasis.  No new findings.   Electronically Signed   By: Carey Bullocks M.D.   On: 01/08/2015 13:50   Dg Chest Port 1 View  01/08/2015   CLINICAL DATA:  Confusion and dyspnea since this morning.  EXAM: PORTABLE CHEST - 1 VIEW  COMPARISON:  05/16/2012.  FINDINGS: The heart is mildly enlarged but stable. The mediastinal and hilar contours are slightly prominent but unchanged. Mild vascular congestion but no edema, infiltrates or effusions. The bony thorax is intact.  IMPRESSION: Stable cardiac enlargement and mild vascular congestion. No pulmonary edema or pleural effusions.   Electronically Signed   By: Rudie Meyer M.D.   On: 01/08/2015 11:42   Dg Abd Portable  1v  01/09/2015   CLINICAL DATA:  Distended abdomen.  EXAM: PORTABLE ABDOMEN - 1 VIEW  COMPARISON:  01/08/2015  FINDINGS: Partial imaging of the abdomen (the upper and right abdomen is excluded from view) shows a nonobstructive bowel gas pattern. Nasogastric tube tip is at the distal stomach.  Right femoral line with tip overlying the right sacral ala.  Hazy appearance of the abdomen is likely from patient size and motion, ascites not excluded. No gross intra-abdominal mass effect.  Lumbosacral fusion.  IMPRESSION: 1. Orogastric tube tip at the distal stomach. 2. Stable appearance of right femoral line. 3. Nonobstructive bowel gas pattern.   Electronically Signed   By: Marnee Spring M.D.   On: 01/09/2015 22:00   Dg Abd Portable 1v  01/08/2015   CLINICAL DATA:  Abdominal pain  EXAM: PORTABLE ABDOMEN - 1 VIEW  COMPARISON:  CT abdomen and pelvis April 25, 2012  FINDINGS: There is moderate stool in the colon. No obstruction is appreciable on this supine examination. No free air is seen on this supine examination. There is postoperative change at L5 and S1. There is a right femoral catheter with the tip overlying the mid sacrum. Catheter tip is likely in the right external iliac vein region.  IMPRESSION: Right frontal region catheter with tip  overlying the mid right sacrum. No obstruction or free air is seen on this supine examination. Note that evaluation for free air is limited on supine only examination.   Electronically Signed   By: Bretta Bang III M.D.   On: 01/08/2015 13:42    Anti-infectives: Anti-infectives    Start     Dose/Rate Route Frequency Ordered Stop   01/10/15 1300  vancomycin (VANCOCIN) 1,750 mg in sodium chloride 0.9 % 500 mL IVPB  Status:  Discontinued     1,750 mg 250 mL/hr over 120 Minutes Intravenous Every 48 hours 01/08/15 1327 01/09/15 1537   01/09/15 1400  piperacillin-tazobactam (ZOSYN) IVPB 2.25 g     2.25 g 100 mL/hr over 30 Minutes Intravenous 3 times per day 01/09/15  0908     01/09/15 1100  cefTRIAXone (ROCEPHIN) 1 g in dextrose 5 % 50 mL IVPB  Status:  Discontinued     1 g 100 mL/hr over 30 Minutes Intravenous Every 24 hours 01/08/15 1133 01/08/15 1327   01/09/15 1100  azithromycin (ZITHROMAX) 500 mg in dextrose 5 % 250 mL IVPB  Status:  Discontinued     500 mg 250 mL/hr over 60 Minutes Intravenous Every 24 hours 01/08/15 1133 01/08/15 1327   01/08/15 2200  piperacillin-tazobactam (ZOSYN) IVPB 3.375 g  Status:  Discontinued     3.375 g 12.5 mL/hr over 240 Minutes Intravenous 3 times per day 01/08/15 1327 01/09/15 0908   01/08/15 1400  vancomycin (VANCOCIN) 2,500 mg in sodium chloride 0.9 % 500 mL IVPB     2,500 mg 250 mL/hr over 120 Minutes Intravenous  Once 01/08/15 1327 01/08/15 1747   01/08/15 1330  piperacillin-tazobactam (ZOSYN) IVPB 3.375 g     3.375 g 100 mL/hr over 30 Minutes Intravenous  Once 01/08/15 1316     01/08/15 1130  cefTRIAXone (ROCEPHIN) 1 g in dextrose 5 % 50 mL IVPB     1 g 100 mL/hr over 30 Minutes Intravenous  Once 01/08/15 1126 01/08/15 1220   01/08/15 1130  azithromycin (ZITHROMAX) 500 mg in dextrose 5 % 250 mL IVPB     500 mg 250 mL/hr over 60 Minutes Intravenous  Once 01/08/15 1126 01/08/15 1322      Assessment/Plan: s/p Procedure(s): EXPLORATORY LAPAROTOMY (N/A) APPLICATION OF WOUND VAC (N/A) Will hold off VAC change today - possible OR tomorrow.  Please keep patient sedated to prevent further evisceration Hepatic/ renal failure - probably secondary to hypoperfusion; slight improvement in AST/ALT;  We will evaluate whether we will be able to close his abdomen tomorrow.  This may depend on his hemodynamics as well as the amount of edema in his abdominal wall.  I spent some time with the family discussing the situation.   LOS: 2 days    Lynton Crescenzo K. 01/10/2015

## 2015-01-10 NOTE — Procedures (Signed)
Hemodialysis Insertion Procedure Note DANTAVIOUS GOBLIRSCH [id number] 1956-05-20  Procedure: Insertion of Central Venous Catheter Indications: Hemodialysis  Procedure Details Consent: Risks of procedure as well as the alternatives and risks of each were explained to the (patient/caregiver).  Consent for procedure obtained. Time Out: Verified patient identification, verified procedure, site/side was marked, verified correct patient position, special equipment/implants available, medications/allergies/relevent history reviewed, required imaging and test results available.  Performed  Maximum sterile technique was used including antiseptics, cap, gloves, gown, hand hygiene, mask and sheet. Skin prep: Chlorhexidine; local anesthetic administered A antimicrobial bonded/coated triple lumen catheter was placed in the right internal jugular vein using the Seldinger technique.  Ultrasound was used to verify the patency of the vein and for real time needle guidance.  Evaluation Blood flow good Complications: No apparent complications Patient did tolerate procedure well. Chest X-ray ordered to verify placement.  CXR: pending.  MCQUAID, DOUGLAS 01/10/2015, 1:17 PM

## 2015-01-10 NOTE — Progress Notes (Signed)
Subjective: Interval History: heparin restarted as INR 1.68 now. Went to the OR for abd fascia closure. Remains intubated and sedated. Having bradycardia.   Objective: Vital signs in last 24 hours: Temp:  [99.2 F (37.3 C)-101.1 F (38.4 C)] 99.2 F (37.3 C) (08/20 0800) Pulse Rate:  [57-69] 57 (08/20 0730) Resp:  [12-22] 21 (08/20 0730) BP: (82-124)/(39-100) 103/46 mmHg (08/20 0700) SpO2:  [93 %-98 %] 98 % (08/20 0730) Arterial Line BP: (93-137)/(38-68) 107/46 mmHg (08/20 0730) FiO2 (%):  [40 %-60 %] 40 % (08/20 0855) Weight:  [299 lb 9.7 oz (135.9 kg)-305 lb 5.4 oz (138.5 kg)] 299 lb 9.7 oz (135.9 kg) (08/20 0515) Weight change:   Intake/Output from previous day: 08/19 0701 - 08/20 0700 In: 3163.3 [I.V.:2742.8; NG/GT:60; IV Piggyback:360.5] Out: 1125 [Urine:25; Emesis/NG output:125; Drains:975] Intake/Output this shift: Total I/O In: 189.9 [I.V.:123.9; IV Piggyback:66] Out: -   General: intubated and sedated. Heent: [place]/at, perrl Card: RRR, no m/r/g Lungs: coarse breathe sounds Abd: distended, midline wound covered with wound vac.  Ext: no edema.   Lab Results:  Recent Labs  01/09/15 1630 01/10/15 0330  WBC 19.9* 23.0*  HGB 15.6 14.5  HCT 43.1 41.2  PLT 141* 161   BMET:  Recent Labs  01/09/15 1630 01/10/15 0330  NA 137 135  K 3.6 3.8  CL 96* 96*  CO2 24 21*  GLUCOSE 106* 127*  BUN 61* 71*  CREATININE 7.58* 8.72*  CALCIUM 7.1* 6.8*   No results for input(s): PTH in the last 72 hours. Iron Studies: No results for input(s): IRON, TIBC, TRANSFERRIN, FERRITIN in the last 72 hours. Studies/Results: US Renal Port  01/08/2015   CLINICAL DATA:  Acute renal failure.  EXAM: RENAL / URINARY TRACT ULTRASOUND COMPLETE  COMPARISON:  05/16/2012  FINDINGS: Right Kidney:  Length: 12.3 cm. The right kidney is more echogenic than the adjacent liver. Poor corticomedullary differentiation. No stones, mass, or hydronephrosis.  Left Kidney:  Length: 12.3 cm. Mildly echogenic  left kidney. Poor corticomedullary differentiation. No stones, mass, or hydronephrosis identified. Poor delineation of the upper pole of the kidney due to poor sonic window.  Bladder:  Empty; Foley catheter in place.  IMPRESSION: 1. Bilateral renal echogenicity with relatively normal renal size, and size not appreciably less than on 05/16/2012. Appearance could be from chronic medical renal disease, or late acute or chronic glomerulonephritis. Renal biopsy can help in further workup if clinically warranted.   Electronically Signed   By: Gaylyn Rong M.D.   On: 01/08/2015 17:12   Dg Chest Port 1 View  01/10/2015   CLINICAL DATA:  Respiratory failure  EXAM: PORTABLE CHEST - 1 VIEW  COMPARISON:  01/09/2015  FINDINGS: Cardiac shadow remains enlarged. A nasogastric catheter and endotracheal tube are again seen and stable. Bibasilar atelectasis is noted with changes suggestive of small effusions bilaterally.  IMPRESSION: Increasing bibasilar atelectasis and small effusions.   Electronically Signed   By: Alcide Clever M.D.   On: 01/10/2015 07:35   Dg Chest Port 1 View  01/09/2015   CLINICAL DATA:  Respiratory failure.  EXAM: PORTABLE CHEST - 1 VIEW  COMPARISON:  01/08/2015.  FINDINGS: Endotracheal tube and NG tube in good anatomic position. Cardiomegaly with pulmonary vascular prominence and diffuse interstitial prominence with bilateral effusions noted. Findings consistent with congestive heart failure. No pneumothorax. Prior cervical spine fusion.  IMPRESSION: 1. Lines and tubes in stable position. 2. Cardiomegaly with pulmonary venous congestion, bilateral interstitial prominence, and left-sided pleural effusion consistent with congestive heart  failure.   Electronically Signed   By: Maisie Fus  Register   On: 01/09/2015 07:16   Dg Chest Portable 1 View  01/08/2015   CLINICAL DATA:  Intubation.  EXAM: PORTABLE CHEST - 1 VIEW  COMPARISON:  Earlier film, same date.  FINDINGS: The endotracheal to is 4 cm above the  carina. The NG tube is coursing down the esophagus and into the stomach. The heart is enlarged but stable. Prominent mediastinal and hilar contours are unchanged. External pacer paddles are noted. The lungs are grossly clear and stable. Persistent bibasilar atelectasis.  IMPRESSION: Support apparatus in good position without complicating features.  Stable cardiac enlargement. No significant pulmonary findings. Persistent bibasilar atelectasis.   Electronically Signed   By: Rudie Meyer M.D.   On: 01/08/2015 17:05   Dg Chest Port 1 View  01/08/2015   CLINICAL DATA:  Shortness of breath, cyanosis and abdominal pain. Femoral line placement.  EXAM: PORTABLE CHEST - 1 VIEW  COMPARISON:  01/08/2015 and 05/16/2012.  FINDINGS: 1328 hr. Two views obtained semi erect. Cardiomegaly appears unchanged. There is mild atelectasis at both lung bases. No evidence of pneumoperitoneum, pneumothorax or significant pleural effusion. Postsurgical changes are present status post lower cervical fusion. Telemetry leads overlie the chest.  IMPRESSION: Stable cardiomegaly and bibasilar atelectasis.  No new findings.   Electronically Signed   By: Carey Bullocks M.D.   On: 01/08/2015 13:50   Dg Chest Port 1 View  01/08/2015   CLINICAL DATA:  Confusion and dyspnea since this morning.  EXAM: PORTABLE CHEST - 1 VIEW  COMPARISON:  05/16/2012.  FINDINGS: The heart is mildly enlarged but stable. The mediastinal and hilar contours are slightly prominent but unchanged. Mild vascular congestion but no edema, infiltrates or effusions. The bony thorax is intact.  IMPRESSION: Stable cardiac enlargement and mild vascular congestion. No pulmonary edema or pleural effusions.   Electronically Signed   By: Rudie Meyer M.D.   On: 01/08/2015 11:42   Dg Abd Portable 1v  01/09/2015   CLINICAL DATA:  Distended abdomen.  EXAM: PORTABLE ABDOMEN - 1 VIEW  COMPARISON:  01/08/2015  FINDINGS: Partial imaging of the abdomen (the upper and right abdomen is  excluded from view) shows a nonobstructive bowel gas pattern. Nasogastric tube tip is at the distal stomach.  Right femoral line with tip overlying the right sacral ala.  Hazy appearance of the abdomen is likely from patient size and motion, ascites not excluded. No gross intra-abdominal mass effect.  Lumbosacral fusion.  IMPRESSION: 1. Orogastric tube tip at the distal stomach. 2. Stable appearance of right femoral line. 3. Nonobstructive bowel gas pattern.   Electronically Signed   By: Marnee Spring M.D.   On: 01/09/2015 22:00   Dg Abd Portable 1v  01/08/2015   CLINICAL DATA:  Abdominal pain  EXAM: PORTABLE ABDOMEN - 1 VIEW  COMPARISON:  CT abdomen and pelvis April 25, 2012  FINDINGS: There is moderate stool in the colon. No obstruction is appreciable on this supine examination. No free air is seen on this supine examination. There is postoperative change at L5 and S1. There is a right femoral catheter with the tip overlying the mid sacrum. Catheter tip is likely in the right external iliac vein region.  IMPRESSION: Right frontal region catheter with tip overlying the mid right sacrum. No obstruction or free air is seen on this supine examination. Note that evaluation for free air is limited on supine only examination.   Electronically Signed   By: Chrissie Noa  Margarita Grizzle III M.D.   On: 01/08/2015 13:42    I have reviewed the patient's current medications.  Assessment/Plan:  59 yo male with hx of HTN, HLD, here after being found unresponsive, found to be in Shock likely 2/2 PE, also has sepsis.   1. Shock - likely obstructive shock from possible PE (RA and RV dilated, RV hypocontractile on echo), but could also be from possible sepsis (PCT high, tmax 101.1, on abx) - on heparin drip, cardiology on board. CT angio deferred until renal improvement. Remains on pressors. 2. Oliguric AKI on CKD 2 - likely 2/2 to shock. Renal u/s shows chronic medial renal disease.  Failed trial of lasix. Started CRRT 8/20.   3. Hypoxic resp failure - likely 2/2 to PE, also does have pulm edema and pulm venous congestion. Remains on vent. 4. shocked Liver - AST/ALT slowly coming down. INR coming down 1.68 today.  5. s/p exp lap - came back from OR with fascia closure. Has wound vac.  6. Gap metabolic acidosis - likely 2/2 to lactic acidosis + renal failure - in the setting of shock.  7. Elevated Trop -  Was >60, now down trending- no ST changes on EKG. cards following. 8. Bradycardia - given trop elevation, cards planning to take to cath Lab tomorrow. No ST elevation currenltly.  9. Hypokalemia - repleted.  Plan:  1. Continue CRRT.  2. continue to support with pressors and abx.      LOS: 2 days   Ahmed, Tasrif 01/10/2015,9:05 AM  Renal Attending: Agree with above. Cont CRRT as tolerated.  Some bradycardia today post op. Chaia Ikard C

## 2015-01-10 NOTE — Progress Notes (Signed)
eLink Physician-Brief Progress Note Patient Name: Jared [name] DOB: 1956-03-26 MRN: [id number]   Date of Service  01/10/2015  HPI/Events of Note  RN notified abdomen appears to have bowel out from under the foam. Patient RASS -1 & on fentanyl gtt.  eICU Interventions  Adding prn IV Ativan. Dr. Andrey Campanile General Surgery notified and coming to assess.     Intervention Category Intermediate Interventions: Other:  Lawanda Cousins 01/10/2015, 12:00 AM

## 2015-01-10 NOTE — Progress Notes (Signed)
Discussed situation with Dr. Andrey Campanile with [facility].  Dr. Andrey Campanile en route to see patient.  Orders given to lay pt flat.  Will monitor.

## 2015-01-10 NOTE — Progress Notes (Signed)
59 yo male with hx of HTN, HLD, here after being found unresponsive, found to be in Shock likely 2/2 to PE  1. Shock - likely obstructive shock from possible PE (RA and RV dilated, RV hypocontractile on echo), but could also be from possible sepsis, s/p exp lap - on heparin drip, cardiology on board. CT angio deferred until renal improvement. Remains on pressors and amio drip. 2. Oliguric AKI due to #1  Plan:  Begin CRRT support as he failed a diuretic trial ( No heparin, minus 50 cc/hr)    Objective: Vital signs in last 24 hours: Temp:  [99.2 F (37.3 C)-101.1 F (38.4 C)] 99.2 F (37.3 C) (08/20 0800) Pulse Rate:  [57-96] 57 (08/20 0730) Resp:  [12-25] 21 (08/20 0730) BP: (82-124)/(39-100) 103/46 mmHg (08/20 0700) SpO2:  [93 %-98 %] 98 % (08/20 0730) Arterial Line BP: (93-137)/(38-68) 107/46 mmHg (08/20 0730) FiO2 (%):  [50 %-60 %] 50 % (08/20 0400) Weight:  [135.9 kg (299 lb 9.7 oz)-138.5 kg (305 lb 5.4 oz)] 135.9 kg (299 lb 9.7 oz) (08/20 0515) Weight change:   Intake/Output from previous day: 08/19 0701 - 08/20 0700 In: 3163.3 [I.V.:2742.8; NG/GT:60; IV Piggyback:360.5] Out: 1125 [Urine:25; Emesis/NG output:125; Drains:975] Intake/Output this shift: Total I/O In: 189.9 [I.V.:123.9; IV Piggyback:66] Out: -   General appearance: appears comfortable, sedated Post op abdomen Ext 1-2+ edema Obese male  Lab Results:  Recent Labs  01/09/15 1630 01/10/15 0330  WBC 19.9* 23.0*  HGB 15.6 14.5  HCT 43.1 41.2  PLT 141* 161   BMET:  Recent Labs  01/09/15 1630 01/10/15 0330  NA 137 135  K 3.6 3.8  CL 96* 96*  CO2 24 21*  GLUCOSE 106* 127*  BUN 61* 71*  CREATININE 7.58* 8.72*  CALCIUM 7.1* 6.8*   No results for input(s): PTH in the last 72 hours. Iron Studies: No results for input(s): IRON, TIBC, TRANSFERRIN, FERRITIN in the last 72 hours. Studies/Results: US Renal Port  01/08/2015   CLINICAL DATA:  Acute renal failure.  EXAM: RENAL / URINARY TRACT  ULTRASOUND COMPLETE  COMPARISON:  05/16/2012  FINDINGS: Right Kidney:  Length: 12.3 cm. The right kidney is more echogenic than the adjacent liver. Poor corticomedullary differentiation. No stones, mass, or hydronephrosis.  Left Kidney:  Length: 12.3 cm. Mildly echogenic left kidney. Poor corticomedullary differentiation. No stones, mass, or hydronephrosis identified. Poor delineation of the upper pole of the kidney due to poor sonic window.  Bladder:  Empty; Foley catheter in place.  IMPRESSION: 1. Bilateral renal echogenicity with relatively normal renal size, and size not appreciably less than on 05/16/2012. Appearance could be from chronic medical renal disease, or late acute or chronic glomerulonephritis. Renal biopsy can help in further workup if clinically warranted.   Electronically Signed   By: Gaylyn Rong M.D.   On: 01/08/2015 17:12   Dg Chest Port 1 View  01/10/2015   CLINICAL DATA:  Respiratory failure  EXAM: PORTABLE CHEST - 1 VIEW  COMPARISON:  01/09/2015  FINDINGS: Cardiac shadow remains enlarged. A nasogastric catheter and endotracheal tube are again seen and stable. Bibasilar atelectasis is noted with changes suggestive of small effusions bilaterally.  IMPRESSION: Increasing bibasilar atelectasis and small effusions.   Electronically Signed   By: Alcide Clever M.D.   On: 01/10/2015 07:35   Dg Chest Port 1 View  01/09/2015   CLINICAL DATA:  Respiratory failure.  EXAM: PORTABLE CHEST - 1 VIEW  COMPARISON:  01/08/2015.  FINDINGS: Endotracheal tube and NG  tube in good anatomic position. Cardiomegaly with pulmonary vascular prominence and diffuse interstitial prominence with bilateral effusions noted. Findings consistent with congestive heart failure. No pneumothorax. Prior cervical spine fusion.  IMPRESSION: 1. Lines and tubes in stable position. 2. Cardiomegaly with pulmonary venous congestion, bilateral interstitial prominence, and left-sided pleural effusion consistent with congestive  heart failure.   Electronically Signed   By: Maisie Fus  Register   On: 01/09/2015 07:16   Dg Chest Portable 1 View  01/08/2015   CLINICAL DATA:  Intubation.  EXAM: PORTABLE CHEST - 1 VIEW  COMPARISON:  Earlier film, same date.  FINDINGS: The endotracheal to is 4 cm above the carina. The NG tube is coursing down the esophagus and into the stomach. The heart is enlarged but stable. Prominent mediastinal and hilar contours are unchanged. External pacer paddles are noted. The lungs are grossly clear and stable. Persistent bibasilar atelectasis.  IMPRESSION: Support apparatus in good position without complicating features.  Stable cardiac enlargement. No significant pulmonary findings. Persistent bibasilar atelectasis.   Electronically Signed   By: Rudie Meyer M.D.   On: 01/08/2015 17:05   Dg Chest Port 1 View  01/08/2015   CLINICAL DATA:  Shortness of breath, cyanosis and abdominal pain. Femoral line placement.  EXAM: PORTABLE CHEST - 1 VIEW  COMPARISON:  01/08/2015 and 05/16/2012.  FINDINGS: 1328 hr. Two views obtained semi erect. Cardiomegaly appears unchanged. There is mild atelectasis at both lung bases. No evidence of pneumoperitoneum, pneumothorax or significant pleural effusion. Postsurgical changes are present status post lower cervical fusion. Telemetry leads overlie the chest.  IMPRESSION: Stable cardiomegaly and bibasilar atelectasis.  No new findings.   Electronically Signed   By: Carey Bullocks M.D.   On: 01/08/2015 13:50   Dg Chest Port 1 View  01/08/2015   CLINICAL DATA:  Confusion and dyspnea since this morning.  EXAM: PORTABLE CHEST - 1 VIEW  COMPARISON:  05/16/2012.  FINDINGS: The heart is mildly enlarged but stable. The mediastinal and hilar contours are slightly prominent but unchanged. Mild vascular congestion but no edema, infiltrates or effusions. The bony thorax is intact.  IMPRESSION: Stable cardiac enlargement and mild vascular congestion. No pulmonary edema or pleural effusions.    Electronically Signed   By: Rudie Meyer M.D.   On: 01/08/2015 11:42   Dg Abd Portable 1v  01/09/2015   CLINICAL DATA:  Distended abdomen.  EXAM: PORTABLE ABDOMEN - 1 VIEW  COMPARISON:  01/08/2015  FINDINGS: Partial imaging of the abdomen (the upper and right abdomen is excluded from view) shows a nonobstructive bowel gas pattern. Nasogastric tube tip is at the distal stomach.  Right femoral line with tip overlying the right sacral ala.  Hazy appearance of the abdomen is likely from patient size and motion, ascites not excluded. No gross intra-abdominal mass effect.  Lumbosacral fusion.  IMPRESSION: 1. Orogastric tube tip at the distal stomach. 2. Stable appearance of right femoral line. 3. Nonobstructive bowel gas pattern.   Electronically Signed   By: Marnee Spring M.D.   On: 01/09/2015 22:00   Dg Abd Portable 1v  01/08/2015   CLINICAL DATA:  Abdominal pain  EXAM: PORTABLE ABDOMEN - 1 VIEW  COMPARISON:  CT abdomen and pelvis April 25, 2012  FINDINGS: There is moderate stool in the colon. No obstruction is appreciable on this supine examination. No free air is seen on this supine examination. There is postoperative change at L5 and S1. There is a right femoral catheter with the tip overlying the mid  sacrum. Catheter tip is likely in the right external iliac vein region.  IMPRESSION: Right frontal region catheter with tip overlying the mid right sacrum. No obstruction or free air is seen on this supine examination. Note that evaluation for free air is limited on supine only examination.   Electronically Signed   By: Bretta Bang III M.D.   On: 01/08/2015 13:42    Scheduled: . antiseptic oral rinse  7 mL Mouth Rinse 10 times per day  . chlorhexidine gluconate  15 mL Mouth Rinse BID  . furosemide  160 mg Intravenous Q6H  . insulin aspart  0-9 Units Subcutaneous 6 times per day  . pantoprazole (PROTONIX) IV  40 mg Intravenous QHS  . piperacillin-tazobactam (ZOSYN)  IV  2.25 g Intravenous 3  times per day  . piperacillin-tazobactam  3.375 g Intravenous Once     LOS: 2 days   Madisyn Mawhinney C 01/10/2015,8:31 AM

## 2015-01-10 NOTE — Progress Notes (Signed)
Bowel protruding out of bottom of dressing.  Dr. Andrey Campanile at bedside to change VAC.  2mg  ativan, 5mg  vecuronium, and Propofol gtt started at .  Will monitor pt.

## 2015-01-10 NOTE — Progress Notes (Signed)
ANTIBIOTIC CONSULT NOTE  Pharmacy Consult for vancomycin and Zosyn Indication: Sepsis due to abdomina source  Allergies  Allergen Reactions  . Morphine And Related Anaphylaxis and Shortness Of Breath  . Montelukast Other (See Comments)    unknown  . Neurontin [Gabapentin] Other (See Comments)    delusional  . Wellbutrin [Bupropion] Palpitations    Insomnia     Labs:  Recent Labs  01/09/15 0148 01/09/15 1415 01/09/15 1630 01/10/15 0330  WBC 27.1*  --  19.9* 23.0*  HGB 18.0*  --  15.6 14.5  PLT 208  --  141* 161  LABCREA  --  196.36  --   --   CREATININE 5.34*  --  7.58* 8.72*   Estimated Creatinine Clearance: 13.1 mL/min (by C-G formula based on Cr of 8.72). No results for input(s): VANCOTROUGH, VANCOPEAK, VANCORANDOM, GENTTROUGH, GENTPEAK, GENTRANDOM, TOBRATROUGH, TOBRAPEAK, TOBRARND, AMIKACINPEAK, AMIKACINTROU, AMIKACIN in the last 72 hours.   Microbiology: Recent Results (from the past 720 hour(s))  Blood Culture (routine x 2)     Status: None (Preliminary result)   Collection Time: 01/08/15 11:31 AM  Result Value Ref Range Status   Specimen Description BLOOD A-LINE  Final   Special Requests BOTTLES DRAWN AEROBIC AND ANAEROBIC 10CC  Final   Culture NO GROWTH < 24 HOURS  Final   Report Status PENDING  Incomplete  Blood Culture (routine x 2)     Status: None (Preliminary result)   Collection Time: 01/08/15 12:11 PM  Result Value Ref Range Status   Specimen Description BLOOD LEFT ANTECUBITAL  Final   Special Requests BOTTLES DRAWN AEROBIC AND ANAEROBIC 5CC  Final   Culture NO GROWTH 1 DAY  Final   Report Status PENDING  Incomplete  MRSA PCR Screening     Status: None   Collection Time: 01/08/15  3:30 PM  Result Value Ref Range Status   MRSA by PCR NEGATIVE NEGATIVE Final    Comment:        The GeneXpert MRSA Assay (FDA approved for NASAL specimens only), is one component of a comprehensive MRSA colonization surveillance program. It is not intended to diagnose  MRSA infection nor to guide or monitor treatment for MRSA infections.   Urine culture     Status: None (Preliminary result)   Collection Time: 01/08/15  4:39 PM  Result Value Ref Range Status   Specimen Description URINE, CATHETERIZED  Final   Special Requests NONE  Final   Culture NO GROWTH < 24 HOURS  Final   Report Status PENDING  Incomplete    Assessment: 31 YOM with history of HTN, HLD, pre-diabetes, chronic pain and depression- [facility] was called for unresponsive agonal breathing- patient improved in the field with CPAP and narcan. Suspected sepsis s/p exploratory lap with application of wound VAC  Blood cultures and urine culture negative to date Zosyn 8/18> Vancomycin 8/18  His renal function has continued to deteriorate, and he is now starting CVVHD  Heparin continues to be on hold for rule out PE  Goal of Therapy:  Appropriate dosing with CVVHD  Plan:  Increase Zosyn to 2.25 grams iv Q 6 hours Start Vancomycin 1250 mg iv Q 24 hours (CVVHD dosing) Follow up CVVHD plans, cultures, progress, fever trend and labs  Thank you Okey Regal, PharmD [phone]  01/10/2015 9:42 AM

## 2015-01-10 NOTE — Progress Notes (Signed)
eLink Physician-Brief Progress Note Patient Name: AVETIS CASTER DOB: 1956/03/01 MRN: [id number]   Date of Service  01/10/2015  HPI/Events of Note  Spoke w/ Dr. Andrey Campanile at bedside. Remains concerned patient is moving & prompting changes in abdomen. He ordered push of paralytic & propofol infusion.  eICU Interventions  Continuing Fentanyl gtt & Propofol infusion for sedation goal of RASS -1. Dr. Andrey Campanile addressing abdomen at bedside.     Intervention Category Major Interventions: Other:  Lawanda Cousins 01/10/2015, 12:36 AM

## 2015-01-10 NOTE — Progress Notes (Signed)
Dr. Andrey Campanile at bedside to assess pt.  Orders given to keep pt flat, sedated, and do not turn.  Dressing leaking at bottom.  Dressing reinforced.  Will monitor pt.

## 2015-01-10 NOTE — Progress Notes (Signed)
01/10/2015  1:46 AM  PATIENT:  Jared [name]  59 y.o. male  PRE-OPERATIVE DIAGNOSIS:  Open abdomen, evisceration  POST-OPERATIVE DIAGNOSIS:  same  PROCEDURE:  Procedure(s): AbThera Wound Vac change at bedside  SURGEON:  Atilano Ina, MD FACS  ASSISTANTS: none   ANESTHESIA:   Propofol, fentanyl, vecuronium  DRAINS: none   LOCAL MEDICATIONS USED:  NONE  SPECIMEN:  No Specimen  DISPOSITION OF SPECIMEN:  N/A  COUNTS:  NO not applicable  INDICATION FOR PROCEDURE: This is a gentleman who presented 2 days ago in septic shock, renal and hepatic failure, back pain and severe abdominal pain. He was taken emergently to the operating room for exploratory laparotomy because of concerns of ischemic bowel. His bowel was viable and his abdomen was left open because he arrested twice and was able to be resuscitated and taken back to the ICU in critical but stable condition. I was called early this evening because of concerns that the open abdominal wound VAC system appear to be failing. Nursing staff reported that it appeared that more bowel was visible and that the wound VAC sponge was protruding more from the abdomen. The patient was on continuous fentanyl drip of 400 mcg/min but no sedation. I was asked to evaluate. On arrival, his open abdominal wound VAC system appeared intact however at the inferior aspect of the incision the plastic sheeting was loose was some portion of the wound VAC sponge exposed. I was able to tuck it back in. The patient was not completely sedated. He coughed and more of the bowel appeared to eviscerate out of the abdomen. At this point it was clear that the bowel was no longer underneath the wound VAC sponge but in contact with the outer plastic drape. At this point I felt the safest thing to do was to change the entire abdominal wound VAC system.  PROCEDURE: This is an emergency procedures and no consent was obtained. The surrounding skin was prepped with Betadine. The  wound VAC was removed. In fact a portion of the omentum on the left side of the abdomen was exposed and in contact with the outer plastic in addition to the small bowel being eviscerated at the inferior aspect of the incision. A timeout was performed. Sterile drape was applied. I obtained a new Abthera wound VAC sponge drape, trimmed it slightly, placed it over the bowel and return the omentum and small bowel to the abdominal cavity. The edges of the sponge were placed toward the paracolic gutters on both sides. Initially I had trouble keeping the intestinal contents within side the abdominal cavity. We gave him 10 mg of vecuronium and at this point it easily reduced into the abdominal cavity. Blue sponge was placed on top of the Abthera wound VAC drape followed by plastic sheets and the suction tubing was connected. There was a good connection and good seal. There is no air leak. There are no immediate complications. I requested that he stay completely sedated throughout the evening. He may in fact require intermittent paralytic boluses  PLAN OF CARE: ICU  PATIENT DISPOSITION:  ICU - intubated and hemodynamically stable.   Delay start of Pharmacological VTE agent (>24hrs) due to surgical blood loss or risk of bleeding:  no  Mary Sella. Andrey Campanile, MD, FACS General, Bariatric, & Minimally Invasive Surgery [facility], [place]

## 2015-01-10 NOTE — Progress Notes (Signed)
PULMONARY / CRITICAL CARE MEDICINE   Name: Jared [name] MRN: [id number] DOB: July 01, 1955    ADMISSION DATE:  01/08/2015 CONSULTATION DATE:  8/18  REFERRING MD :  EDP Dr Criss Alvine  CHIEF COMPLAINT:  Found down  INITIAL PRESENTATION:   59 yo male had severe abdominal pain, and then found unresponsive at home.  In ER he was in shock, cyanotic, with lactic acidosis, and hyperkalemia.  STUDIES:  8/18 Echo >> EF 45 to 50%, mod RV dilation, mod/severe RV dysfx, PAS 47 mmHg 8/18 Renal u/s >> b/l renal echogenicity  SIGNIFICANT EVENTS: 8/18 Admit, CCS, cardiology consulted; brief PEA arrest; laparotomy with placement of wound vac; started heparin gtt for presumed PE 8/19 Renal consulted  SUBJECTIVE:  8/20 : Overnight with abd wound /vac evisceration with bedside wound vac change per CCS  Propofol started for increased sedation along with fentanyl  Remains on Pressors with Levo/Vaso  Followed commands on WUA per RN     VITAL SIGNS: Temp:  [99.8 F (37.7 C)-101.1 F (38.4 C)] 100.3 F (37.9 C) (08/20 0400) Pulse Rate:  [37-96] 57 (08/20 0715) Resp:  [12-25] 17 (08/20 0715) BP: (82-124)/(39-100) 103/46 mmHg (08/20 0700) SpO2:  [93 %-98 %] 98 % (08/20 0715) Arterial Line BP: (93-137)/(38-68) 104/46 mmHg (08/20 0715) FiO2 (%):  [50 %-60 %] 50 % (08/20 0400) Weight:  [299 lb 9.7 oz (135.9 kg)-305 lb 5.4 oz (138.5 kg)] 299 lb 9.7 oz (135.9 kg) (08/20 0515) HEMODYNAMICS:   VENTILATOR SETTINGS: Vent Mode:  [-] PRVC FiO2 (%):  [50 %-60 %] 50 % Set Rate:  [16 bmp] 16 bmp Vt Set:  [670 mL] 670 mL PEEP:  [5 cmH20] 5 cmH20 Plateau Pressure:  [18 cmH20-20 cmH20] 20 cmH20 INTAKE / OUTPUT:  Intake/Output Summary (Last 24 hours) at 01/10/15 0734 Last data filed at 01/10/15 0700  Gross per 24 hour  Intake 3031.26 ml  Output   1125 ml  Net 1906.26 ml    PHYSICAL EXAMINATION: General: ill appearing on vent  Neuro: sedated moves extremities HEENT: ETT in place Cardiovascular:  regular, no murmur Lungs: faint basilar crackles Abdomen: mild distention, wound vac in place Musculoskeletal: no edema  LABS:  CBC  Recent Labs Lab 01/09/15 0148 01/09/15 1630 01/10/15 0330  WBC 27.1* 19.9* 23.0*  HGB 18.0* 15.6 14.5  HCT 48.9 43.1 41.2  PLT 208 141* 161     Coag's  Recent Labs Lab 01/08/15 1350 01/09/15 1630  INR 1.54* 2.24*   BMET  Recent Labs Lab 01/09/15 0148 01/09/15 1630 01/10/15 0330  NA 134* 137 135  K 3.8 3.6 3.8  CL 98* 96* 96*  CO2 20* 24 21*  BUN 48* 61* 71*  CREATININE 5.34* 7.58* 8.72*  GLUCOSE 113* 106* 127*   Electrolytes  Recent Labs Lab 01/08/15 1350  01/09/15 0148 01/09/15 1630 01/10/15 0330  CALCIUM  --   < > 7.6* 7.1* 6.8*  MG 1.9  --  2.5*  --  2.2  PHOS  --   --  3.7  --  4.6  < > = values in this interval not displayed. Sepsis Markers  Recent Labs Lab 01/09/15 0130 01/09/15 0945 01/09/15 1630 01/10/15 0330  LATICACIDVEN 3.5*  --  1.7 1.5  PROCALCITON  --  40.68  --  37.87   ABG  Recent Labs Lab 01/09/15 0410 01/09/15 1901 01/10/15 0408  PHART 7.437 7.339* 7.348*  PCO2ART 32.4* 45.3* 41.9  PO2ART 99.8 88.0 106*   Liver Enzymes  Recent Labs Lab  01/08/15 1100 01/09/15 1630 01/10/15 0330  AST 626* 4557* 2907*  ALT 729* 4100* 3366*  ALKPHOS 73 83 74  BILITOT 1.3* 1.7* 1.7*  ALBUMIN 4.1 2.4* 2.1*   Cardiac Enzymes  Recent Labs Lab 01/09/15 0914 01/09/15 1512 01/09/15 2129  TROPONINI >65.00* >65.00* 49.60*   Glucose  Recent Labs Lab 01/09/15 0802 01/09/15 1202 01/09/15 1553 01/09/15 2010 01/09/15 2344 01/10/15 0400  GLUCAP 101* 114* 114* 121* 107* 126*    Imaging Dg Abd Portable 1v  01/09/2015   CLINICAL DATA:  Distended abdomen.  EXAM: PORTABLE ABDOMEN - 1 VIEW  COMPARISON:  01/08/2015  FINDINGS: Partial imaging of the abdomen (the upper and right abdomen is excluded from view) shows a nonobstructive bowel gas pattern. Nasogastric tube tip is at the distal stomach.   Right femoral line with tip overlying the right sacral ala.  Hazy appearance of the abdomen is likely from patient size and motion, ascites not excluded. No gross intra-abdominal mass effect.  Lumbosacral fusion.  IMPRESSION: 1. Orogastric tube tip at the distal stomach. 2. Stable appearance of right femoral line. 3. Nonobstructive bowel gas pattern.   Electronically Signed   By: Marnee Spring M.D.   On: 01/09/2015 22:00     ASSESSMENT / PLAN:  PULMONARY ETT 8/18 >>> A: Acute respiratory failure 2nd to presumed PE, lactic acidosis. Hx of OSA  P:   Full vent support F/u CXR, ABG Adjust PEEP/FiO2 to keep SpO2 > 92% VAP     CARDIOVASCULAR Rt femoral CVL 8/18 >> Lt femoral A line 8/18 >> A: Shock >> likely septic, cardiogenic. RV failure with possible PE.-although venous doppler neg for DVT ? MI related  Hx of HTN, HLD. Atrial fib  Probable Acute MI (w/ ST elevation /elevation troponin )  P:  Wean pressors to keep MAP > 65 Continue amiodarone per cardiology Heparin on hold d/t elevated INR Check doppler legs Defer CT chest with contrast due to concern for renal dysfx When stable could consider VQ scan  Hold outpt lipitor, bisoprolol, HCTZ, losartan, minoxidil  RENAL AKI >> baseline creatinine 1.14 from 12/17/14., renal US neg >renal function is worse with decreased UOP  Hyperkalemia >> resolved  Hyponatremia.>resolved  Metabolic acidosis.>improved  Hx of BPH. P:   Lasix drip 8/19   Will consult nephrology Cont IVF   GASTROINTESTINAL A: Acute abdominal pain s/p laparotomy with wound vac 8/18.>wound vac change 8/19 d/t eviceration Shock Liver >LFT tr up  P:   Post-op care per surgery Tentative plan to OR for washout today  Protonix for SUP Tr LFT   HEMATOLOGIC A: Coagulopathy with elevated INR probably secnodary to shock liver  P:  F/u CBC and INR   INFECTIOUS A: Sepsis with presumed abdominal source.>exp lap was neg .  Temp and WBC tr up  >Now w/ wound  vac will need to cont abx for now . Follow Cx and PCT ,  P:   Day 2 vancomycin, zosyn  Blood 8/18 >> Urine 8/18 >>  ENDOCRINE A: Hyperglycemia  P:   SSI   NEUROLOGIC A: Acute metabolic encephalopathy >> improved 8/18.>follows commands off sedation  Hx of chronic back pain, anxiety, depression. P:   RASS goal: -1 to -2  Hold outpt xanax, celexa, oxycodone, lyrica     Somalia Segler NP-C  [facility]  [phone]

## 2015-01-11 ENCOUNTER — Inpatient Hospital Stay (HOSPITAL_COMMUNITY): Payer: Medicare Other | Admitting: Anesthesiology

## 2015-01-11 ENCOUNTER — Encounter (HOSPITAL_COMMUNITY): Payer: Self-pay | Admitting: Anesthesiology

## 2015-01-11 ENCOUNTER — Inpatient Hospital Stay (HOSPITAL_COMMUNITY): Payer: Medicare Other

## 2015-01-11 ENCOUNTER — Encounter (HOSPITAL_COMMUNITY)
Admission: EM | Disposition: A | Discharge status: Nursing Home (Includes ICF) | Payer: Self-pay | Source: Home / Self Care | Attending: Pulmonary Disease

## 2015-01-11 DIAGNOSIS — R570 Cardiogenic shock: Secondary | ICD-10-CM

## 2015-01-11 HISTORY — PX: WOUND DEBRIDEMENT: SHX247

## 2015-01-11 LAB — GLUCOSE, CAPILLARY
GLUCOSE-CAPILLARY: 142 mg/dL — AB (ref 65–99)
GLUCOSE-CAPILLARY: 130 mg/dL — AB (ref 65–99)
GLUCOSE-CAPILLARY: 115 mg/dL — AB (ref 65–99)
Glucose-Capillary: 82 mg/dL (ref 65–99)
Glucose-Capillary: 86 mg/dL (ref 65–99)
Glucose-Capillary: 97 mg/dL (ref 65–99)

## 2015-01-11 LAB — PROCALCITONIN: PROCALCITONIN: 25.12 ng/mL

## 2015-01-11 LAB — COMPREHENSIVE METABOLIC PANEL
ALBUMIN: 2 g/dL — AB (ref 3.5–5.0)
ALT: 2981 U/L — ABNORMAL HIGH (ref 17–63)
AST: 1601 U/L — AB (ref 15–41)
Alkaline Phosphatase: 82 U/L (ref 38–126)
Anion gap: 15 (ref 5–15)
BUN: 67 mg/dL — AB (ref 6–20)
CHLORIDE: 98 mmol/L — AB (ref 101–111)
CO2: 20 mmol/L — AB (ref 22–32)
Calcium: 6.7 mg/dL — ABNORMAL LOW (ref 8.9–10.3)
Creatinine, Ser: 8.43 mg/dL — ABNORMAL HIGH (ref 0.61–1.24)
GFR calc Af Amer: 7 mL/min — ABNORMAL LOW (ref >60)
GFR calc non Af Amer: 6 mL/min — ABNORMAL LOW (ref >60)
GLUCOSE: 143 mg/dL — AB (ref 65–99)
POTASSIUM: 3.2 mmol/L — AB (ref 3.5–5.1)
Sodium: 133 mmol/L — ABNORMAL LOW (ref 135–145)
Total Bilirubin: 2.5 mg/dL — ABNORMAL HIGH (ref 0.3–1.2)
Total Protein: 3.9 g/dL — ABNORMAL LOW (ref 6.5–8.1)

## 2015-01-11 LAB — BLOOD GAS, ARTERIAL
Acid-base deficit: 3.2 mmol/L — ABNORMAL HIGH (ref 0.0–2.0)
BICARBONATE: 20.3 meq/L (ref 20.0–24.0)
FIO2: 0.5
O2 Saturation: 95.1 %
PATIENT TEMPERATURE: 98.6
PCO2 ART: 30.4 mmHg — AB (ref 35.0–45.0)
PEEP: 5 cmH2O
PO2 ART: 79.8 mmHg — AB (ref 80.0–100.0)
RATE: 25 resp/min
TCO2: 21.2 mmol/L (ref 0–100)
VT: 670 mL
pH, Arterial: 7.439 (ref 7.350–7.450)

## 2015-01-11 LAB — TROPONIN I: Troponin I: 4.71 ng/mL (ref <0.031)

## 2015-01-11 LAB — MAGNESIUM
MAGNESIUM: 2.4 mg/dL (ref 1.7–2.4)
Magnesium: 2.4 mg/dL (ref 1.7–2.4)

## 2015-01-11 LAB — BASIC METABOLIC PANEL
Anion gap: 15 (ref 5–15)
BUN: 65 mg/dL — AB (ref 6–20)
CO2: 20 mmol/L — ABNORMAL LOW (ref 22–32)
Calcium: 6.7 mg/dL — ABNORMAL LOW (ref 8.9–10.3)
Chloride: 99 mmol/L — ABNORMAL LOW (ref 101–111)
Creatinine, Ser: 8.55 mg/dL — ABNORMAL HIGH (ref 0.61–1.24)
GFR, EST AFRICAN AMERICAN: 7 mL/min — AB (ref >60)
GFR, EST NON AFRICAN AMERICAN: 6 mL/min — AB (ref >60)
Glucose, Bld: 119 mg/dL — ABNORMAL HIGH (ref 65–99)
POTASSIUM: 3.4 mmol/L — AB (ref 3.5–5.1)
SODIUM: 134 mmol/L — AB (ref 135–145)

## 2015-01-11 LAB — PROTIME-INR
INR: 1.68 — AB (ref 0.00–1.49)
Prothrombin Time: 19.8 seconds — ABNORMAL HIGH (ref 11.6–15.2)

## 2015-01-11 LAB — CBC
HEMATOCRIT: 39.3 % (ref 39.0–52.0)
HEMOGLOBIN: 14.5 g/dL (ref 13.0–17.0)
MCH: 31.7 pg (ref 26.0–34.0)
MCHC: 36.9 g/dL — ABNORMAL HIGH (ref 30.0–36.0)
MCV: 85.8 fL (ref 78.0–100.0)
Platelets: 121 10*3/uL — ABNORMAL LOW (ref 150–400)
RBC: 4.58 MIL/uL (ref 4.22–5.81)
RDW: 13.4 % (ref 11.5–15.5)
WBC: 19.9 10*3/uL — AB (ref 4.0–10.5)

## 2015-01-11 LAB — PHOSPHORUS
Phosphorus: 3.4 mg/dL (ref 2.5–4.6)
Phosphorus: 4.3 mg/dL (ref 2.5–4.6)

## 2015-01-11 LAB — LIPASE, BLOOD: LIPASE: 43 U/L (ref 22–51)

## 2015-01-11 LAB — AMYLASE: AMYLASE: 198 U/L — AB (ref 28–100)

## 2015-01-11 SURGERY — DEBRIDEMENT, WOUND
Anesthesia: General | Site: Abdomen

## 2015-01-11 MED ORDER — SODIUM CHLORIDE 0.9 % IV SOLN
INTRAVENOUS | Status: DC | PRN
  Administered 2015-01-11: 08:00:00 via INTRAVENOUS

## 2015-01-11 MED ORDER — LIDOCAINE HCL (CARDIAC) 20 MG/ML IV SOLN
INTRAVENOUS | Status: AC
  Filled 2015-01-11: qty 5

## 2015-01-11 MED ORDER — FENTANYL CITRATE (PF) 250 MCG/5ML IJ SOLN
INTRAMUSCULAR | Status: AC
  Filled 2015-01-11: qty 5

## 2015-01-11 MED ORDER — FENTANYL CITRATE (PF) 100 MCG/2ML IJ SOLN
INTRAMUSCULAR | Status: DC | PRN
  Administered 2015-01-11 (×2): 100 ug via INTRAVENOUS
  Administered 2015-01-11: 50 ug via INTRAVENOUS

## 2015-01-11 MED ORDER — NOREPINEPHRINE BITARTRATE 1 MG/ML IV SOLN
4000.0000 ug | INTRAVENOUS | Status: DC | PRN
  Administered 2015-01-11: 5.5 ug/min via INTRAVENOUS

## 2015-01-11 MED ORDER — STERILE WATER FOR INJECTION IJ SOLN
INTRAMUSCULAR | Status: AC
  Filled 2015-01-11: qty 10

## 2015-01-11 MED ORDER — POTASSIUM CHLORIDE 10 MEQ/50ML IV SOLN
10.0000 meq | INTRAVENOUS | Status: DC
  Administered 2015-01-11 (×3): 10 meq via INTRAVENOUS
  Filled 2015-01-11 (×3): qty 50

## 2015-01-11 MED ORDER — ARTIFICIAL TEARS OP OINT
TOPICAL_OINTMENT | OPHTHALMIC | Status: DC | PRN
  Administered 2015-01-11: 1 via OPHTHALMIC

## 2015-01-11 MED ORDER — ATROPINE SULFATE 0.1 MG/ML IJ SOLN
INTRAMUSCULAR | Status: AC
  Filled 2015-01-11: qty 10

## 2015-01-11 MED ORDER — VANCOMYCIN HCL 10 G IV SOLR
1250.0000 mg | INTRAVENOUS | Status: DC
  Administered 2015-01-11 – 2015-01-13 (×3): 1250 mg via INTRAVENOUS
  Filled 2015-01-11 (×4): qty 1250

## 2015-01-11 MED ORDER — PHENYLEPHRINE 40 MCG/ML (10ML) SYRINGE FOR IV PUSH (FOR BLOOD PRESSURE SUPPORT)
PREFILLED_SYRINGE | INTRAVENOUS | Status: AC
  Filled 2015-01-11: qty 10

## 2015-01-11 MED ORDER — SODIUM CHLORIDE 0.9 % IJ SOLN
3.0000 mL | INTRAMUSCULAR | Status: DC | PRN
  Administered 2015-01-12: 3 mL via INTRAVENOUS
  Filled 2015-01-11: qty 3

## 2015-01-11 MED ORDER — VECURONIUM BROMIDE 10 MG IV SOLR
INTRAVENOUS | Status: DC | PRN
  Administered 2015-01-11 (×2): 10 mg via INTRAVENOUS

## 2015-01-11 MED ORDER — SODIUM CHLORIDE 0.9 % IV SOLN: 250.0000 mL | INTRAVENOUS | Status: DC | PRN

## 2015-01-11 MED ORDER — POTASSIUM CHLORIDE 10 MEQ/50ML IV SOLN: 10.0000 meq | INTRAVENOUS | Status: AC

## 2015-01-11 MED ORDER — LACTATED RINGERS IV SOLN
INTRAVENOUS | Status: DC | PRN
  Administered 2015-01-11: 08:00:00 via INTRAVENOUS

## 2015-01-11 MED ORDER — SODIUM CHLORIDE 0.9 % IV SOLN: INTRAVENOUS | Status: DC

## 2015-01-11 MED ORDER — ASPIRIN 81 MG PO CHEW: 81.0000 mg | CHEWABLE_TABLET | ORAL | Status: DC

## 2015-01-11 MED ORDER — VECURONIUM BROMIDE 10 MG IV SOLR
INTRAVENOUS | Status: AC
  Filled 2015-01-11: qty 10

## 2015-01-11 MED ORDER — ASPIRIN 300 MG RE SUPP
300.0000 mg | Freq: Every day | RECTAL | Status: DC
  Administered 2015-01-11 – 2015-01-12 (×2): 300 mg via RECTAL
  Filled 2015-01-11 (×3): qty 1

## 2015-01-11 MED ORDER — VASOPRESSIN 20 UNIT/ML IV SOLN
100.0000 [IU] | INTRAVENOUS | Status: DC | PRN
  Administered 2015-01-11: .03 [IU]/min via INTRAVENOUS

## 2015-01-11 MED ORDER — PROPOFOL INFUSION 10 MG/ML OPTIME
INTRAVENOUS | Status: DC | PRN
  Administered 2015-01-11: 30.05 ug/kg/min via INTRAVENOUS

## 2015-01-11 MED ORDER — 0.9 % SODIUM CHLORIDE (POUR BTL) OPTIME
TOPICAL | Status: DC | PRN
  Administered 2015-01-11 (×3): 1000 mL

## 2015-01-11 MED ORDER — ROCURONIUM BROMIDE 50 MG/5ML IV SOLN
INTRAVENOUS | Status: AC
  Filled 2015-01-11: qty 1

## 2015-01-11 MED ORDER — HEPARIN (PORCINE) IN NACL 100-0.45 UNIT/ML-% IJ SOLN
2250.0000 [IU]/h | INTRAMUSCULAR | Status: DC
  Administered 2015-01-11: 2000 [IU]/h via INTRAVENOUS
  Administered 2015-01-12 (×2): 2250 [IU]/h via INTRAVENOUS
  Filled 2015-01-11 (×6): qty 250

## 2015-01-11 MED ORDER — SODIUM CHLORIDE 0.9 % IJ SOLN
3.0000 mL | Freq: Two times a day (BID) | INTRAMUSCULAR | Status: DC
  Administered 2015-01-11 – 2015-01-12 (×2): 3 mL via INTRAVENOUS
  Administered 2015-01-12: 6 mL via INTRAVENOUS

## 2015-01-11 SURGICAL SUPPLY — 38 items
BLADE SURG ROTATE 9660 (MISCELLANEOUS) IMPLANT
CANISTER WOUND CARE 500ML ATS (WOUND CARE) ×2 IMPLANT
COVER SURGICAL LIGHT HANDLE (MISCELLANEOUS) ×3 IMPLANT
DRAPE LAPAROTOMY TRNSV 102X78 (DRAPE) ×3 IMPLANT
DRAPE UTILITY XL STRL (DRAPES) ×6 IMPLANT
DRSG VAC ATS LRG SENSATRAC (GAUZE/BANDAGES/DRESSINGS) ×2 IMPLANT
ELECT CAUTERY BLADE 6.4 (BLADE) ×3 IMPLANT
ELECT REM PT RETURN 9FT ADLT (ELECTROSURGICAL) ×3
ELECTRODE REM PT RTRN 9FT ADLT (ELECTROSURGICAL) ×1 IMPLANT
GAUZE SPONGE 4X4 12PLY STRL (GAUZE/BANDAGES/DRESSINGS) ×3 IMPLANT
GLOVE BIO SURGEON STRL SZ7 (GLOVE) ×3 IMPLANT
GLOVE BIOGEL PI IND STRL 7.5 (GLOVE) ×1 IMPLANT
GLOVE BIOGEL PI INDICATOR 7.5 (GLOVE) ×2
GOWN STRL REUS W/ TWL LRG LVL3 (GOWN DISPOSABLE) ×2 IMPLANT
GOWN STRL REUS W/TWL LRG LVL3 (GOWN DISPOSABLE) ×6
KIT BASIN OR (CUSTOM PROCEDURE TRAY) ×3 IMPLANT
KIT ROOM TURNOVER OR (KITS) ×3 IMPLANT
NS IRRIG 1000ML POUR BTL (IV SOLUTION) ×3 IMPLANT
PACK SURGICAL SETUP 50X90 (CUSTOM PROCEDURE TRAY) ×3 IMPLANT
PAD ARMBOARD 7.5X6 YLW CONV (MISCELLANEOUS) ×6 IMPLANT
PENCIL BUTTON HOLSTER BLD 10FT (ELECTRODE) ×3 IMPLANT
RETAINER VISCERA MED (MISCELLANEOUS) ×2 IMPLANT
SPECIMEN JAR SMALL (MISCELLANEOUS) IMPLANT
SPONGE LAP 18X18 X RAY DECT (DISPOSABLE) ×3 IMPLANT
SUT MNCRL AB 4-0 PS2 18 (SUTURE) IMPLANT
SUT NOVA 1 T20/GS 25DT (SUTURE) ×4 IMPLANT
SUT NOVA NAB GS-21 0 18 T12 DT (SUTURE) ×8 IMPLANT
SUT SILK 3 0 SH CR/8 (SUTURE) ×2 IMPLANT
SUT VIC AB 2-0 SH 27 (SUTURE)
SUT VIC AB 2-0 SH 27X BRD (SUTURE) IMPLANT
SUT VIC AB 3-0 SH 27 (SUTURE)
SUT VIC AB 3-0 SH 27XBRD (SUTURE) IMPLANT
TOWEL OR 17X24 6PK STRL BLUE (TOWEL DISPOSABLE) ×3 IMPLANT
TOWEL OR 17X26 10 PK STRL BLUE (TOWEL DISPOSABLE) ×3 IMPLANT
TUBE CONNECTING 12'X1/4 (SUCTIONS) ×1
TUBE CONNECTING 12X1/4 (SUCTIONS) ×2 IMPLANT
WATER STERILE IRR 1000ML POUR (IV SOLUTION) IMPLANT
YANKAUER SUCT BULB TIP NO VENT (SUCTIONS) ×3 IMPLANT

## 2015-01-11 NOTE — Progress Notes (Signed)
Patient ID: KYHEIM VUNCANNON, male   DOB: Mar 11, 1956, 59 y.o.   MRN: [id number]    Subjective:  Intubated and sedated Back from OR for closure of Abdomen Noted to have bradycardia.  CRRT restarted 11:00am and BP ok  Objective:  Filed Vitals:   01/11/15 1030 01/11/15 1045 01/11/15 1100 01/11/15 1153  BP:      Pulse: 43 43 41   Temp:    97.6 F (36.4 C)  TempSrc:    Oral  Resp: 25 25 25    Weight:      SpO2: 97% 95% 96%     Intake/Output from previous day:  Intake/Output Summary (Last 24 hours) at 01/11/15 1235 Last data filed at 01/11/15 1100  Gross per 24 hour  Intake 4637.44 ml  Output   4123 ml  Net 514.44 ml    Physical Exam: Obese ill white male  HEENT: normal Neck supple with no adenopathy multiple lines NG tube intubated  JVP normal no bruits no thyromegaly Lungs clear with no wheezing and good diaphragmatic motion Heart:  S1/S2 no murmur, no rub, gallop or click PMI normal Abdomen: soft wound vac in place no bruit.  No HSM or HJR Distal pulses intact with no bruits  Right femoral vein catheter  No edema Neuro non-focal Skin warm and dry    Lab Results: Basic Metabolic Panel:  Recent Labs  09/31/12 0330 01/11/15 0413  NA 135 133*  K 3.8 3.2*  CL 96* 98*  CO2 21* 20*  GLUCOSE 127* 143*  BUN 71* 67*  CREATININE 8.72* 8.43*  CALCIUM 6.8* 6.7*  MG 2.2 2.4  PHOS 4.6 3.4   Liver Function Tests:  Recent Labs  01/10/15 0330 01/11/15 0413  AST 2907* 1601*  ALT 3366* 2981*  ALKPHOS 74 82  BILITOT 1.7* 2.5*  PROT 3.7* 3.9*  ALBUMIN 2.1* 2.0*    Recent Labs  01/10/15 2342  LIPASE 43  AMYLASE 198*   CBC:  Recent Labs  01/10/15 0330 01/11/15 0413  WBC 23.0* 19.9*  HGB 14.5 14.5  HCT 41.2 39.3  MCV 89.0 85.8  PLT 161 121*   Cardiac Enzymes:  Recent Labs  01/09/15 0914 01/09/15 1512 01/09/15 2129  TROPONINI >65.00* >65.00* 49.60*     Recent Labs  01/10/15 0157  TRIG 324*    Imaging: Dg Chest Port 1 View  01/11/2015    CLINICAL DATA:  Acute respiratory failure with hypoxia. On ventilator. Septic shock.  EXAM: PORTABLE CHEST - 1 VIEW  COMPARISON:  01/10/2015  FINDINGS: Support lines and tubes in appropriate position. Cardiomegaly stable. Bibasilar pulmonary opacity is seen likely representing combination of pleural effusions and infiltrate or atelectasis. These findings are unchanged. No evidence of pneumothorax.  IMPRESSION: No significant change compared with prior exam.   Electronically Signed   By: Myles Rosenthal M.D.   On: 01/11/2015 07:50   Dg Chest Port 1 View  01/10/2015   CLINICAL DATA:  Acute respiratory failure with hypoxemia  EXAM: PORTABLE CHEST - 1 VIEW  COMPARISON:  01/10/2015  FINDINGS: Cardiomegaly is again identified. An endotracheal tube and nasogastric catheter are again identified and stable. A new left jugular central line is noted with the tip near the proximal superior vena cava. A right jugular line is noted within the right innominate vein. No pneumothorax is noted. Bilateral pleural effusions right greater than left are seen. There is likely some underlying atelectasis present.  IMPRESSION: Bilateral pleural effusions.  Tubes and lines as described above without pneumothorax.  Electronically Signed   By: Alcide Clever M.D.   On: 01/10/2015 14:10   Dg Chest Port 1 View  01/10/2015   CLINICAL DATA:  Respiratory failure  EXAM: PORTABLE CHEST - 1 VIEW  COMPARISON:  01/09/2015  FINDINGS: Cardiac shadow remains enlarged. A nasogastric catheter and endotracheal tube are again seen and stable. Bibasilar atelectasis is noted with changes suggestive of small effusions bilaterally.  IMPRESSION: Increasing bibasilar atelectasis and small effusions.   Electronically Signed   By: Alcide Clever M.D.   On: 01/10/2015 07:35   Dg Abd Portable 1v  01/09/2015   CLINICAL DATA:  Distended abdomen.  EXAM: PORTABLE ABDOMEN - 1 VIEW  COMPARISON:  01/08/2015  FINDINGS: Partial imaging of the abdomen (the upper and right  abdomen is excluded from view) shows a nonobstructive bowel gas pattern. Nasogastric tube tip is at the distal stomach.  Right femoral line with tip overlying the right sacral ala.  Hazy appearance of the abdomen is likely from patient size and motion, ascites not excluded. No gross intra-abdominal mass effect.  Lumbosacral fusion.  IMPRESSION: 1. Orogastric tube tip at the distal stomach. 2. Stable appearance of right femoral line. 3. Nonobstructive bowel gas pattern.   Electronically Signed   By: Marnee Spring M.D.   On: 01/09/2015 22:00    Cardiac Studies:  ECG:  SB no AV block and no ischemia ST elevation or depression    Telemetry:  SB rate 46    Echo: 01/08/15  Study Conclusions  - Left ventricle: The cavity size was normal. There was mild concentric hypertrophy. Systolic function was mildly reduced. The estimated ejection fraction was in the range of 45% to 50%. There is hypokinesis of the inferior myocardium. - Ventricular septum: The contour showed diastolic flattening. - Mitral valve: There was mild regurgitation. - Right ventricle: The cavity size was moderately dilated. Wall thickness was normal. Systolic function was moderately to severely reduced. - Right atrium: The atrium was moderately dilated. - Pulmonary arteries: Systolic pressure was moderately increased. PA peak pressure: 47 mm Hg (S).  Impressions:  - When compared to prior echocardiogram, RV remains dilated however RV systolic function is decreased.  Medications:   . antiseptic oral rinse  7 mL Mouth Rinse 10 times per day  . atropine      . chlorhexidine gluconate  15 mL Mouth Rinse BID  . insulin aspart  0-9 Units Subcutaneous 6 times per day  . pantoprazole (PROTONIX) IV  40 mg Intravenous QHS  . piperacillin-tazobactam (ZOSYN)  IV  2.25 g Intravenous 4 times per day  . potassium chloride  10 mEq Intravenous Q1 Hr x 6  . vancomycin  1,250 mg Intravenous Q24H     . sodium chloride 10  mL/hr (01/11/15 1049)  . fentaNYL infusion INTRAVENOUS 300 mcg/hr (01/11/15 1118)  . norepinephrine (LEVOPHED) Adult infusion 10 mcg/min (01/11/15 1104)  . dialysis replacement fluid (prismasate) 200 mL/hr at 01/11/15 1048  . dialysis replacement fluid (prismasate) 200 mL/hr at 01/11/15 1048  . dialysate (PRISMASATE) 1,000 mL/hr at 01/11/15 1048  . propofol (DIPRIVAN) infusion Stopped (01/11/15 1123)  . vasopressin (PITRESSIN) infusion - *FOR SHOCK* Stopped (01/11/15 1058)    Assessment/Plan:  Elevated Troponin:  S/P arrest with abdominal exploration and secondary closure.  ARF on CRRT.  Bradycardia with no AV block Seen note from Dr Eldridge Dace.  Echo with inferior hypokinesis and RV dilatation. Initial ECG 8/18 with J point elevation in inferior leads No ischemic bowel found.  Abdomen now closed.  Given issues with bradycardia very elevated troponin and problems with dialysis  Due to bradycardia feel cath indicated.  Will tentatively plan on tomorrow.  Currently no indication for TMP no AV block BP 130 systolic And current ECG with no ischemic changes or ST elevation.  Discussed with Tammy Parrett Start heparin if ok with surgery ASA suppository    Charlton Haws 01/11/2015, 12:35 PM

## 2015-01-11 NOTE — Op Note (Signed)
Preop diagnosis: Open abdomen after exploratory laparotomy Postop diagnosis: Same Procedure performed: Exploratory laparotomy with closure of abdominal fascia and placement of abdominal wound VAC Surgeon:Sheritha Louis K. Anesthesia: Gen. endotracheal Indications: This is a 59 year old male who presented on 01/08/15 in extremis in septic shock with respiratory failure, renal failure, hepatic failure, and status post 2 cardiac arrests. We did a rapid exploration of his abdomen to rule out ischemic bowel. There was no ischemic bowel but his abdomen was left open with an abdominal VAC. This was changed at the bedside early the morning of 01/10/15. He comes today or reexploration and possible closure of his fascia. His hemodynamic status has been improving and his abdominal wall seems fairly lax.  Description of procedure: The patient's brought to the operating room placed in supine position on the operating room table. After an adequate level of general anesthesia was obtained the abdominal VAC was removed. The skin around the wound was prepped with Betadine and draped sterile fashion. A timeout was taken to ensure the proper patient proper procedure. We then explored his abdomen. The small bowel appears to be pink and well perfused. There are no ischemic areas. There is no dilation of the small bowel. The colon appears normal. Liver stomach also appear normal. I put a couple of 3-0 silk sutures in the anterior wall of the stomach to attach it to the anterior abdominal wall. I performed this just in case the patient may need a feeding tube in the future. This should allow for a percutaneous endoscopic gastrostomy as opposed to an open gastrostomy. We irrigated the entire abdomen and inspected carefully for hemostasis. His abdominal wall seems fairly lax so I made the decision to close. We closed the fascia with multiple interrupted figure-of-eight #1 Novafil sutures. The subcutaneous tissues were irrigated. We cut  a sent strep of a large VAC sponge and placed in the wound. This was sealed with a drape placed to 1 25 mmHg suction. There was a good seal with no sign of leak. The patient was then transported back to the intensive care unit in critical condition. All sponge, instrument, and needle counts are correct.  Wilmon Arms. Corliss Skains, MD, [facility]  General/ Trauma Surgery  01/11/2015 9:53 AM

## 2015-01-11 NOTE — Progress Notes (Signed)
eLink Physician-Brief Progress Note Patient Name: Jared [name] DOB: [birth year] MRN: [id number]   Date of Service  01/11/2015  HPI/Events of Note  Contacted by nurse about coolness of right foot and some cyanosis of second toe.  DP pulse can be picked up with doppler and PT pulse is palpable.  Rest of leg not involed.  eICU Interventions  Continued monitoring of foot and follow neuro vascular exam     Intervention Category Minor Interventions: Clinical assessment - ordering diagnostic tests  Henry Russel, P 01/11/2015, 6:54 PM

## 2015-01-11 NOTE — Progress Notes (Signed)
Bagged to OR with CRNA and unit RN at this time without complications.

## 2015-01-11 NOTE — Progress Notes (Signed)
3 Days Post-Op  Subjective: Patient remains on Levophed and Vasopressin; weaning Levophed Off Amiodarone for bradycardia Planning OR this morning for abdominal washout and possible closure of fascia  Objective: Vital signs in last 24 hours: Temp:  [97.4 F (36.3 C)-99.3 F (37.4 C)] 97.5 F (36.4 C) (08/21 0345) Pulse Rate:  [41-59] 41 (08/21 0700) Resp:  [16-25] 25 (08/21 0700) BP: (99-148)/(44-74) 110/59 mmHg (08/21 0700) SpO2:  [93 %-100 %] 98 % (08/21 0700) Arterial Line BP: (74-183)/(33-85) 133/61 mmHg (08/21 0700) FiO2 (%):  [40 %-50 %] 50 % (08/21 0700) Weight:  [136.5 kg (300 lb 14.9 oz)] 136.5 kg (300 lb 14.9 oz) (08/21 0415)    Intake/Output from previous day: 08/20 0701 - 08/21 0700 In: 4918.8 [I.V.:4192.8; NG/GT:150; IV Piggyback:516] Out: 4082 [Urine:110; Emesis/NG output:550; Drains:450] Intake/Output this shift:    General appearance: intubated, sedated GI: soft; VAC with good seal  Lab Results:   Recent Labs  01/10/15 0330 01/11/15 0413  WBC 23.0* 19.9*  HGB 14.5 14.5  HCT 41.2 39.3  PLT 161 121*   BMET  Recent Labs  01/10/15 0330 01/11/15 0413  NA 135 133*  K 3.8 3.2*  CL 96* 98*  CO2 21* 20*  GLUCOSE 127* 143*  BUN 71* 67*  CREATININE 8.72* 8.43*  CALCIUM 6.8* 6.7*   PT/INR  Recent Labs  01/09/15 1630 01/11/15 0413  LABPROT 24.6* 19.8*  INR 2.24* 1.68*   ABG  Recent Labs  01/09/15 1901 01/10/15 0408  PHART 7.339* 7.348*  HCO3 24.0 22.2    Studies/Results: Dg Chest Port 1 View  01/10/2015   CLINICAL DATA:  Acute respiratory failure with hypoxemia  EXAM: PORTABLE CHEST - 1 VIEW  COMPARISON:  01/10/2015  FINDINGS: Cardiomegaly is again identified. An endotracheal tube and nasogastric catheter are again identified and stable. A new left jugular central line is noted with the tip near the proximal superior vena cava. A right jugular line is noted within the right innominate vein. No pneumothorax is noted. Bilateral pleural  effusions right greater than left are seen. There is likely some underlying atelectasis present.  IMPRESSION: Bilateral pleural effusions.  Tubes and lines as described above without pneumothorax.   Electronically Signed   By: Alcide Clever M.D.   On: 01/10/2015 14:10   Dg Chest Port 1 View  01/10/2015   CLINICAL DATA:  Respiratory failure  EXAM: PORTABLE CHEST - 1 VIEW  COMPARISON:  01/09/2015  FINDINGS: Cardiac shadow remains enlarged. A nasogastric catheter and endotracheal tube are again seen and stable. Bibasilar atelectasis is noted with changes suggestive of small effusions bilaterally.  IMPRESSION: Increasing bibasilar atelectasis and small effusions.   Electronically Signed   By: Alcide Clever M.D.   On: 01/10/2015 07:35   Dg Abd Portable 1v  01/09/2015   CLINICAL DATA:  Distended abdomen.  EXAM: PORTABLE ABDOMEN - 1 VIEW  COMPARISON:  01/08/2015  FINDINGS: Partial imaging of the abdomen (the upper and right abdomen is excluded from view) shows a nonobstructive bowel gas pattern. Nasogastric tube tip is at the distal stomach.  Right femoral line with tip overlying the right sacral ala.  Hazy appearance of the abdomen is likely from patient size and motion, ascites not excluded. No gross intra-abdominal mass effect.  Lumbosacral fusion.  IMPRESSION: 1. Orogastric tube tip at the distal stomach. 2. Stable appearance of right femoral line. 3. Nonobstructive bowel gas pattern.   Electronically Signed   By: Marnee Spring M.D.   On: 01/09/2015 22:00  Anti-infectives: Anti-infectives    Start     Dose/Rate Route Frequency Ordered Stop   01/10/15 1300  vancomycin (VANCOCIN) 1,750 mg in sodium chloride 0.9 % 500 mL IVPB  Status:  Discontinued     1,750 mg 250 mL/hr over 120 Minutes Intravenous Every 48 hours 01/08/15 1327 01/09/15 1537   01/10/15 1300  vancomycin (VANCOCIN) 1,250 mg in sodium chloride 0.9 % 250 mL IVPB     1,250 mg 166.7 mL/hr over 90 Minutes Intravenous Every 24 hours 01/10/15  0947     01/10/15 1200  piperacillin-tazobactam (ZOSYN) IVPB 2.25 g     2.25 g 100 mL/hr over 30 Minutes Intravenous 4 times per day 01/10/15 0929     01/09/15 1400  piperacillin-tazobactam (ZOSYN) IVPB 2.25 g  Status:  Discontinued     2.25 g 100 mL/hr over 30 Minutes Intravenous 3 times per day 01/09/15 0908 01/10/15 0929   01/09/15 1100  cefTRIAXone (ROCEPHIN) 1 g in dextrose 5 % 50 mL IVPB  Status:  Discontinued     1 g 100 mL/hr over 30 Minutes Intravenous Every 24 hours 01/08/15 1133 01/08/15 1327   01/09/15 1100  azithromycin (ZITHROMAX) 500 mg in dextrose 5 % 250 mL IVPB  Status:  Discontinued     500 mg 250 mL/hr over 60 Minutes Intravenous Every 24 hours 01/08/15 1133 01/08/15 1327   01/08/15 2200  piperacillin-tazobactam (ZOSYN) IVPB 3.375 g  Status:  Discontinued     3.375 g 12.5 mL/hr over 240 Minutes Intravenous 3 times per day 01/08/15 1327 01/09/15 0908   01/08/15 1400  vancomycin (VANCOCIN) 2,500 mg in sodium chloride 0.9 % 500 mL IVPB     2,500 mg 250 mL/hr over 120 Minutes Intravenous  Once 01/08/15 1327 01/08/15 1747   01/08/15 1330  piperacillin-tazobactam (ZOSYN) IVPB 3.375 g  Status:  Discontinued     3.375 g 100 mL/hr over 30 Minutes Intravenous  Once 01/08/15 1316 01/10/15 0929   01/08/15 1130  cefTRIAXone (ROCEPHIN) 1 g in dextrose 5 % 50 mL IVPB     1 g 100 mL/hr over 30 Minutes Intravenous  Once 01/08/15 1126 01/08/15 1220   01/08/15 1130  azithromycin (ZITHROMAX) 500 mg in dextrose 5 % 250 mL IVPB     500 mg 250 mL/hr over 60 Minutes Intravenous  Once 01/08/15 1126 01/08/15 1322      Assessment/Plan: s/p Procedure(s): EXPLORATORY LAPAROTOMY (N/A) APPLICATION OF WOUND VAC (N/A) To OR this morning for washout and possible fascial closure  LOS: 3 days    Jared [name] K. 01/11/2015

## 2015-01-11 NOTE — Anesthesia Procedure Notes (Signed)
Date/Time: 01/11/2015 8:24 AM Performed by: Wray Kearns A Pre-anesthesia Checklist: Patient identified, Timeout performed, Emergency Drugs available, Suction available and Patient being monitored Patient Re-evaluated:Patient Re-evaluated prior to inductionOxygen Delivery Method: Circle system utilized Preoxygenation: Pre-oxygenation with 100% oxygen Intubation Type: Inhalational induction with existing ETT Tube type: Oral Placement Confirmation: positive ETCO2 Dental Injury: Teeth and Oropharynx as per pre-operative assessment

## 2015-01-11 NOTE — Progress Notes (Signed)
ANTICOAGULATION CONSULT NOTE - Initial Consult  Pharmacy Consult for Heparin Indication: rule out PE  Allergies  Allergen Reactions  . Morphine And Related Anaphylaxis and Shortness Of Breath  . Montelukast Other (See Comments)    unknown  . Neurontin [Gabapentin] Other (See Comments)    delusional  . Wellbutrin [Bupropion] Palpitations    Insomnia    Patient Measurements: Weight: (!) 300 lb 14.9 oz (136.5 kg)  Vital Signs: Temp: 97.6 F (36.4 C) (08/21 1153) Temp Source: Oral (08/21 1153) BP: 118/63 mmHg (08/21 1300) Pulse Rate: 49 (08/21 1300)  Labs:  Recent Labs  01/08/15 1350  01/09/15 0149 01/09/15 0914 01/09/15 1132 01/09/15 1512 01/09/15 1630 01/09/15 2129 01/10/15 0330 01/11/15 0413  HGB  --   < >  --   --   --   --  15.6  --  14.5 14.5  HCT  --   < >  --   --   --   --  43.1  --  41.2 39.3  PLT  --   < >  --   --   --   --  141*  --  161 121*  LABPROT 18.5*  --   --   --   --   --  24.6*  --   --  19.8*  INR 1.54*  --   --   --   --   --  2.24*  --   --  1.68*  HEPARINUNFRC  --   --  0.18*  --  0.35  --   --   --   --   --   CREATININE  --   < >  --   --   --   --  7.58*  --  8.72* 8.43*  TROPONINI 1.13*  < > >65.00* >65.00*  --  >65.00*  --  49.60*  --   --   < > = values in this interval not displayed.  Estimated Creatinine Clearance: 13.6 mL/min (by C-G formula based on Cr of 8.43).   Medical History: Past Medical History  Diagnosis Date  . Chest pain   . HTN (hypertension)   . Hyperlipemia   . DJD (degenerative joint disease)   . BPH (benign prostatic hypertrophy)   . Obesity   . OSA (obstructive sleep apnea)   . Hypercholesteremia   . Chronic back pain   . Anxiety   . Depression     Assessment: Pharmacy asked to restart heparin if ok with surgery.  He is s/p abdominal wound closure today.  I talked with Dr. Andrey Campanile, who is ok with restarting heparin 6 hours after surgery with no bolus.  Heparin to restart for presumed PE (doppelrs  negative for DVT).  His INR continues to be elevated at 1.66 with his elevated liver enzymes.   Previously therapeutic on heparin at rate of 2100 to 2200 units / hr  Goal of Therapy:  Heparin level 0.3-0.5 units/ml Monitor platelets by anticoagulation protocol: Yes   Plan:  To restart heparin today at 4 pm at conservative rate of 2000 units / hr (for now) Heparin level 8 hours after heparin starts Daily heparin level, CBC  Thank you. Okey Regal, PharmD  Elwin Sleight 01/11/2015,1:39 PM

## 2015-01-11 NOTE — Anesthesia Postprocedure Evaluation (Signed)
  Anesthesia Post-op Note  Patient: Jared [name]  Procedure(s) Performed: Procedure(s): DEBRIDEMENT WOUND AND VAC DRESSING CHANGE (N/A)  Patient Location: PACU  Anesthesia Type:General  Level of Consciousness: sedated, unresponsive and Patient remains intubated per anesthesia plan  Airway and Oxygen Therapy: Patient remains intubated per anesthesia plan and Patient placed on Ventilator (see vital sign flow sheet for setting)  Post-op Pain: none  Post-op Assessment: Post-op Vital signs reviewed and Patient's Cardiovascular Status Stable LLE Motor Response: Other (Comment) (uta; no stimulation per md)   RLE Motor Response: Other (Comment) (uta; no stimulation per md)        Post-op Vital Signs: stable  Last Vitals:  Filed Vitals:   01/11/15 0801  BP:   Pulse:   Temp: 36.4 C  Resp:     Complications: No apparent anesthesia complications

## 2015-01-11 NOTE — Anesthesia Preprocedure Evaluation (Addendum)
Anesthesia Evaluation  Patient identified by MRN, date of birth, ID band Patient unresponsive    Reviewed: Patient's Chart, lab work & pertinent test results, Unable to perform ROS - Chart review only  Airway Mallampati: Intubated       Dental   Pulmonary sleep apnea , COPD COPD inhaler, former smoker,  breath sounds clear to auscultation  + decreased breath sounds      Cardiovascular hypertension, Pt. on medications Rhythm:Regular Rate:Normal  Hemodynamically unstable, with CPR in progress  '12 myoview: normal '12 ECHO: normal LVF and valves   Neuro/Psych Pupils fixed and dilated with CPR Chronic back and neck pain: narcotics    GI/Hepatic Acute abdomen with necrotic bowel   Endo/Other  Morbid obesity  Renal/GU      Musculoskeletal   Abdominal   Peds  Hematology   Anesthesia Other Findings   Reproductive/Obstetrics                            Anesthesia Physical Anesthesia Plan  ASA: III  Anesthesia Plan: General   Post-op Pain Management:    Induction: Intravenous  Airway Management Planned:   Additional Equipment:   Intra-op Plan:   Post-operative Plan: Post-operative intubation/ventilation  Informed Consent: I have reviewed the patients History and Physical, chart, labs and discussed the procedure including the risks, benefits and alternatives for the proposed anesthesia with the patient or authorized representative who has indicated his/her understanding and acceptance.     Plan Discussed with: CRNA  Anesthesia Plan Comments:         Anesthesia Quick Evaluation

## 2015-01-11 NOTE — Progress Notes (Signed)
eLink Physician-Brief Progress Note Patient Name: Jared [name] DOB: Mar 20, 1956 MRN: [id number]   Date of Service  01/11/2015  HPI/Events of Note  HR 42 - acall from RN- says earlier was in 85s. SBP 122 but on levophed and amio gtt  eICU Interventions  Dc amio and monitopr Levophed for MAP goal > 65     Intervention Category Major Interventions: Arrhythmia - evaluation and management  Holston Oyama 01/11/2015, 2:48 AM

## 2015-01-11 NOTE — Progress Notes (Signed)
PULMONARY / CRITICAL CARE MEDICINE   Name: GEVORG BRUM MRN: [id number] DOB: Mar 16, 1956    ADMISSION DATE:  01/08/2015 CONSULTATION DATE:  8/18  REFERRING MD :  EDP Dr Criss Alvine  CHIEF COMPLAINT:  Found down  INITIAL PRESENTATION:   59 yo male had severe abdominal pain, and then found unresponsive at home.  In ER he was in shock, cyanotic, with lactic acidosis, and hyperkalemia.  STUDIES:  8/18 Echo >> EF 45 to 50%, mod RV dilation, mod/severe RV dysfx, PAS 47 mmHg 8/18 Renal u/s >> b/l renal echogenicity  SIGNIFICANT EVENTS: 8/18 Admit, CCS, cardiology consulted; brief PEA arrest; laparotomy with placement of wound vac; started heparin gtt for presumed PE 8/19 Renal consulted 8/20 CRRT  8/21 OR for abd wound closure   SUBJECTIVE:  8/21 :  To OR this am with abd wound closure , did well  CRRT started yesterday , no change in scr or UOP  Bradycardia overnight with Amio stopped.  HR improved but remains in upper 40s Remains on pressors with Levo and Vaso , decreased demands      VITAL SIGNS: Temp:  [97.4 F (36.3 C)-99.3 F (37.4 C)] 97.5 F (36.4 C) (08/21 0801) Pulse Rate:  [41-59] 43 (08/21 0800) Resp:  [15-25] 15 (08/21 0800) BP: (99-148)/(44-74) 108/60 mmHg (08/21 0800) SpO2:  [93 %-100 %] 99 % (08/21 0800) Arterial Line BP: (74-183)/(33-85) 138/63 mmHg (08/21 0800) FiO2 (%):  [50 %] 50 % (08/21 0817) Weight:  [300 lb 14.9 oz (136.5 kg)] 300 lb 14.9 oz (136.5 kg) (08/21 0415) HEMODYNAMICS: CVP:  [10 mmHg-14 mmHg] 10 mmHg VENTILATOR SETTINGS: Vent Mode:  [-] PRVC FiO2 (%):  [50 %] 50 % Set Rate:  [16 bmp] 16 bmp Vt Set:  [670 mL] 670 mL PEEP:  [5 cmH20] 5 cmH20 Plateau Pressure:  [23 cmH20-24 cmH20] 24 cmH20 INTAKE / OUTPUT:  Intake/Output Summary (Last 24 hours) at 01/11/15 1015 Last data filed at 01/11/15 0945  Gross per 24 hour  Intake 4579.87 ml  Output   4047 ml  Net 532.87 ml    PHYSICAL EXAMINATION: General: ill appearing on vent  Neuro:  sedated moves extremities HEENT: ETT in place Cardiovascular: SB , no murmur Lungs: faint basilar crackles Abdomen: mild distention, wound vac in place Musculoskeletal: no edema  LABS:  CBC  Recent Labs Lab 01/09/15 1630 01/10/15 0330 01/11/15 0413  WBC 19.9* 23.0* 19.9*  HGB 15.6 14.5 14.5  HCT 43.1 41.2 39.3  PLT 141* 161 121*     Coag's  Recent Labs Lab 01/08/15 1350 01/09/15 1630 01/11/15 0413  INR 1.54* 2.24* 1.68*   BMET  Recent Labs Lab 01/09/15 1630 01/10/15 0330 01/11/15 0413  NA 137 135 133*  K 3.6 3.8 3.2*  CL 96* 96* 98*  CO2 24 21* 20*  BUN 61* 71* 67*  CREATININE 7.58* 8.72* 8.43*  GLUCOSE 106* 127* 143*   Electrolytes  Recent Labs Lab 01/09/15 0148 01/09/15 1630 01/10/15 0330 01/11/15 0413  CALCIUM 7.6* 7.1* 6.8* 6.7*  MG 2.5*  --  2.2 2.4  PHOS 3.7  --  4.6 3.4   Sepsis Markers  Recent Labs Lab 01/09/15 0130 01/09/15 0945 01/09/15 1630 01/10/15 0330 01/11/15 0413  LATICACIDVEN 3.5*  --  1.7 1.5  --   PROCALCITON  --  40.68  --  37.87 25.12   ABG  Recent Labs Lab 01/09/15 0410 01/09/15 1901 01/10/15 0408  PHART 7.437 7.339* 7.348*  PCO2ART 32.4* 45.3* 41.9  PO2ART 99.8 88.0  106*   Liver Enzymes  Recent Labs Lab 01/09/15 1630 01/10/15 0330 01/11/15 0413  AST 4557* 2907* 1601*  ALT 4100* 3366* 2981*  ALKPHOS 83 74 82  BILITOT 1.7* 1.7* 2.5*  ALBUMIN 2.4* 2.1* 2.0*   Cardiac Enzymes  Recent Labs Lab 01/09/15 0914 01/09/15 1512 01/09/15 2129  TROPONINI >65.00* >65.00* 49.60*   Glucose  Recent Labs Lab 01/10/15 1206 01/10/15 1635 01/10/15 2008 01/10/15 2334 01/11/15 0335 01/11/15 0757  GLUCAP 135* 133* 128* 147* 142* 130*    Imaging Dg Chest Port 1 View  01/11/2015   CLINICAL DATA:  Acute respiratory failure with hypoxia. On ventilator. Septic shock.  EXAM: PORTABLE CHEST - 1 VIEW  COMPARISON:  01/10/2015  FINDINGS: Support lines and tubes in appropriate position. Cardiomegaly stable.  Bibasilar pulmonary opacity is seen likely representing combination of pleural effusions and infiltrate or atelectasis. These findings are unchanged. No evidence of pneumothorax.  IMPRESSION: No significant change compared with prior exam.   Electronically Signed   By: Myles Rosenthal M.D.   On: 01/11/2015 07:50   Dg Chest Port 1 View  01/10/2015   CLINICAL DATA:  Acute respiratory failure with hypoxemia  EXAM: PORTABLE CHEST - 1 VIEW  COMPARISON:  01/10/2015  FINDINGS: Cardiomegaly is again identified. An endotracheal tube and nasogastric catheter are again identified and stable. A new left jugular central line is noted with the tip near the proximal superior vena cava. A right jugular line is noted within the right innominate vein. No pneumothorax is noted. Bilateral pleural effusions right greater than left are seen. There is likely some underlying atelectasis present.  IMPRESSION: Bilateral pleural effusions.  Tubes and lines as described above without pneumothorax.   Electronically Signed   By: Alcide Clever M.D.   On: 01/10/2015 14:10     ASSESSMENT / PLAN:  PULMONARY ETT 8/18 >>> A: Acute respiratory failure 2nd to presumed PE, lactic acidosis. Hx of OSA  P:   Full vent support F/u CXR, ABG Adjust PEEP/FiO2 to keep SpO2 > 92% VAP precautions     CARDIOVASCULAR Rt femoral CVL 8/18 >>8/20 Lt femoral A line 8/18 >> RIJ HD 8/20 >> LIJ 8/20 >>  A: Shock >> likely cardiogenic. RV failure with possible PE.-although venous doppler neg for DVT ? MI related  Hx of HTN, HLD. Atrial fib  Probable Acute MI (w/ ST elevation /elevation troponin )  P:  Wean pressors to keep MAP > 65 Amiodarone on hold 8/21 d/t bradycardia  Heparin on hold d/t elevated INR  >INR tr down -now 1.6 , consider restarting Hep drip  Defer CT chest with contrast due to concern for renal dysfx./When stable could consider VQ scan  Hold outpt lipitor, bisoprolol, HCTZ, losartan, minoxidil  RENAL AKI >> baseline  creatinine 1.14 from 12/17/14., renal US neg >renal function is worse with decreased UOP >CRRT started per renal 8/20  Hyperkalemia >> resolved  Hyponatremia. Metabolic acidosis.>improved  Hx of BPH. Hypokalemia  P:   Lasix drip stopped 8/20  CRRT per Renal 8/20  KVO  IVF   GASTROINTESTINAL A: Acute abdominal pain s/p laparotomy with wound vac 8/18.>wound vac change 8/19 d/t eviceration>to OR 8/21 with wound closure  Shock Liver >LFT tr down P:   Post-op care per surgery  Protonix for SUP Tr LFT  Consider TF today as not able to extubate today     HEMATOLOGIC A: Coagulopathy with elevated INR probably secnodary to shock liver >improving with INR tr down  P:  F/u  CBC and INR   INFECTIOUS A: Sepsis with presumed abdominal source.>exp lap was neg .  Temp and WBC tr up  >Now w/ wound vac will need to cont abx for now . Follow Cx and PCT ,  P:   Day 2 vancomycin, zosyn  Blood 8/18 >> Urine 8/18 >>NEG   ENDOCRINE A: Hyperglycemia  P:   SSI   NEUROLOGIC A: Acute metabolic encephalopathy >> improved 8/18.>follows commands off sedation  Hx of chronic back pain, anxiety, depression. P:   RASS goal: -1  Wean sedation as able  Hold outpt xanax, celexa, oxycodone, lyrica     Tammy Parrett NP-C  [facility]  [phone]

## 2015-01-11 NOTE — Transfer of Care (Signed)
Immediate Anesthesia Transfer of Care Note  Patient: Jared [name]  Procedure(s) Performed: Procedure(s): DEBRIDEMENT WOUND AND VAC DRESSING CHANGE (N/A)  Patient Location: ICU  Anesthesia Type:General  Level of Consciousness: sedated and Patient remains intubated per anesthesia plan  Airway & Oxygen Therapy: Patient remains intubated per anesthesia plan and Patient placed on Ventilator (see vital sign flow sheet for setting)  Post-op Assessment: Report given to RN and Post -op Vital signs reviewed and stable  Post vital signs: Reviewed and stable  Last Vitals:  Filed Vitals:   01/11/15 0801  BP:   Pulse:   Temp: 36.4 C  Resp:     Complications: No apparent anesthesia complications

## 2015-01-12 ENCOUNTER — Inpatient Hospital Stay (HOSPITAL_COMMUNITY): Payer: Medicare Other

## 2015-01-12 DIAGNOSIS — R001 Bradycardia, unspecified: Secondary | ICD-10-CM

## 2015-01-12 DIAGNOSIS — N19 Unspecified kidney failure: Secondary | ICD-10-CM | POA: Diagnosis present

## 2015-01-12 LAB — GLUCOSE, CAPILLARY
GLUCOSE-CAPILLARY: 83 mg/dL (ref 65–99)
Glucose-Capillary: 80 mg/dL (ref 65–99)
Glucose-Capillary: 71 mg/dL (ref 65–99)
Glucose-Capillary: 64 mg/dL — ABNORMAL LOW (ref 65–99)
Glucose-Capillary: 73 mg/dL (ref 65–99)
Glucose-Capillary: 72 mg/dL (ref 65–99)

## 2015-01-12 LAB — RENAL FUNCTION PANEL
ALBUMIN: 2 g/dL — AB (ref 3.5–5.0)
ANION GAP: 12 (ref 5–15)
BUN: 58 mg/dL — ABNORMAL HIGH (ref 6–20)
CALCIUM: 7.2 mg/dL — AB (ref 8.9–10.3)
CO2: 22 mmol/L (ref 22–32)
Chloride: 103 mmol/L (ref 101–111)
Creatinine, Ser: 7.16 mg/dL — ABNORMAL HIGH (ref 0.61–1.24)
GFR calc non Af Amer: 7 mL/min — ABNORMAL LOW (ref >60)
GFR, EST AFRICAN AMERICAN: 9 mL/min — AB (ref >60)
Glucose, Bld: 83 mg/dL (ref 65–99)
PHOSPHORUS: 4.2 mg/dL (ref 2.5–4.6)
Potassium: 4.3 mmol/L (ref 3.5–5.1)
SODIUM: 137 mmol/L (ref 135–145)

## 2015-01-12 LAB — COMPREHENSIVE METABOLIC PANEL
ALBUMIN: 2 g/dL — AB (ref 3.5–5.0)
ALK PHOS: 79 U/L (ref 38–126)
ALT: 2171 U/L — AB (ref 17–63)
AST: 686 U/L — AB (ref 15–41)
Anion gap: 11 (ref 5–15)
BUN: 59 mg/dL — AB (ref 6–20)
CALCIUM: 7 mg/dL — AB (ref 8.9–10.3)
CO2: 23 mmol/L (ref 22–32)
CREATININE: 8.03 mg/dL — AB (ref 0.61–1.24)
Chloride: 103 mmol/L (ref 101–111)
GFR calc non Af Amer: 6 mL/min — ABNORMAL LOW (ref >60)
GFR, EST AFRICAN AMERICAN: 7 mL/min — AB (ref >60)
GLUCOSE: 87 mg/dL (ref 65–99)
Potassium: 4 mmol/L (ref 3.5–5.1)
SODIUM: 137 mmol/L (ref 135–145)
Total Bilirubin: 3.5 mg/dL — ABNORMAL HIGH (ref 0.3–1.2)
Total Protein: 4.3 g/dL — ABNORMAL LOW (ref 6.5–8.1)

## 2015-01-12 LAB — HEPARIN LEVEL (UNFRACTIONATED)
HEPARIN UNFRACTIONATED: 0.33 [IU]/mL (ref 0.30–0.70)
HEPARIN UNFRACTIONATED: 0.39 [IU]/mL (ref 0.30–0.70)
Heparin Unfractionated: 0.23 IU/mL — ABNORMAL LOW (ref 0.30–0.70)

## 2015-01-12 LAB — CBC
HCT: 38.6 % — ABNORMAL LOW (ref 39.0–52.0)
HEMOGLOBIN: 14 g/dL (ref 13.0–17.0)
MCH: 30.9 pg (ref 26.0–34.0)
MCHC: 36.3 g/dL — ABNORMAL HIGH (ref 30.0–36.0)
MCV: 85.2 fL (ref 78.0–100.0)
Platelets: 102 10*3/uL — ABNORMAL LOW (ref 150–400)
RBC: 4.53 MIL/uL (ref 4.22–5.81)
RDW: 13.6 % (ref 11.5–15.5)
WBC: 14.9 10*3/uL — ABNORMAL HIGH (ref 4.0–10.5)

## 2015-01-12 LAB — PROTIME-INR
INR: 1.34 (ref 0.00–1.49)
Prothrombin Time: 16.7 seconds — ABNORMAL HIGH (ref 11.6–15.2)

## 2015-01-12 LAB — LIPASE, BLOOD: Lipase: 80 U/L — ABNORMAL HIGH (ref 22–51)

## 2015-01-12 LAB — AMYLASE: Amylase: 218 U/L — ABNORMAL HIGH (ref 28–100)

## 2015-01-12 MED ORDER — HEPARIN (PORCINE) IN NACL 100-0.45 UNIT/ML-% IJ SOLN
2250.0000 [IU]/h | INTRAMUSCULAR | Status: DC
  Administered 2015-01-13: 2250 [IU]/h via INTRAVENOUS
  Filled 2015-01-12 (×4): qty 250

## 2015-01-12 MED ORDER — ANTISEPTIC ORAL RINSE SOLUTION (CORINZ)
7.0000 mL | Freq: Four times a day (QID) | OROMUCOSAL | Status: DC
  Administered 2015-01-12 – 2015-02-09 (×76): 7 mL via OROMUCOSAL

## 2015-01-12 NOTE — Progress Notes (Signed)
Subjective: Interval History: weaned the pressor, bradycardia is better. Remains intubated and sedated. Some concern for right 2nd toe cyanosis overnight.  Objective: Vital signs in last 24 hours: Temp:  [97.9 F (36.6 C)-99.1 F (37.3 C)] 97.9 F (36.6 C) (08/22 1130) Pulse Rate:  [44-72] 72 (08/22 0740) Resp:  [22-26] 26 (08/22 0740) BP: (84-126)/(41-63) 126/59 mmHg (08/22 0740) SpO2:  [97 %-100 %] 99 % (08/22 0740) Arterial Line BP: (85-284)/(42-279) 117/55 mmHg (08/22 0700) FiO2 (%):  [40 %-50 %] 40 % (08/22 0948) Weight:  [302 lb 4 oz (137.1 kg)] 302 lb 4 oz (137.1 kg) (08/22 0430) Weight change: 1 lb 5.2 oz (0.6 kg)  Intake/Output from previous day: 08/21 0701 - 08/22 0700 In: 3018.8 [I.V.:2154.8; NG/GT:150; IV Piggyback:684] Out: 2832 [Urine:52; Emesis/NG output:750; Drains:50; Blood:25] Intake/Output this shift: Total I/O In: 130 [I.V.:100; NG/GT:30] Out: 345 [Urine:10; Other:335]  General: intubated and sedated. Heent: /at, perrl Card: RRR, no m/r/g Lungs: coarse breathe sounds Abd: distended, midline wound covered with wound vac.  Ext: no edema. His exts are no longer blue/dusky after stopping pressor.   Lab Results:  Recent Labs  01/11/15 0413 01/12/15 0414  WBC 19.9* 14.9*  HGB 14.5 14.0  HCT 39.3 38.6*  PLT 121* 102*   BMET:   Recent Labs  01/11/15 1215 01/12/15 0414  NA 134* 137  K 3.4* 4.0  CL 99* 103  CO2 20* 23  GLUCOSE 119* 87  BUN 65* 59*  CREATININE 8.55* 8.03*  CALCIUM 6.7* 7.0*   No results for input(s): PTH in the last 72 hours. Iron Studies: No results for input(s): IRON, TIBC, TRANSFERRIN, FERRITIN in the last 72 hours. Studies/Results: Dg Chest Port 1 View  01/12/2015   CLINICAL DATA:  Respiratory failure.  EXAM: PORTABLE CHEST - 1 VIEW  COMPARISON:  01/11/2015.  FINDINGS: Endotracheal tube, NG tube, bilateral IJ lines in stable position. Cardiomegaly with pulmonary vascular prominence and bilateral pulmonary alveolar  infiltrates consistent with bilateral pulmonary edema. Bilateral pleural effusions are noted. No pneumothorax. Prior cervical spine fusion.  IMPRESSION: 1. Lines and tubes in stable position. 2. Cardiomegaly with bilateral pulmonary alveolar infiltrates and pleural effusions consistent with congestive heart failure. Bilateral pneumonia cannot be excluded . No and improvement from prior exam.   Electronically Signed   By: Maisie Fus  Register   On: 01/12/2015 07:13   Dg Chest Port 1 View  01/11/2015   CLINICAL DATA:  Acute respiratory failure with hypoxia. On ventilator. Septic shock.  EXAM: PORTABLE CHEST - 1 VIEW  COMPARISON:  01/10/2015  FINDINGS: Support lines and tubes in appropriate position. Cardiomegaly stable. Bibasilar pulmonary opacity is seen likely representing combination of pleural effusions and infiltrate or atelectasis. These findings are unchanged. No evidence of pneumothorax.  IMPRESSION: No significant change compared with prior exam.   Electronically Signed   By: Myles Rosenthal M.D.   On: 01/11/2015 07:50   Dg Chest Port 1 View  01/10/2015   CLINICAL DATA:  Acute respiratory failure with hypoxemia  EXAM: PORTABLE CHEST - 1 VIEW  COMPARISON:  01/10/2015  FINDINGS: Cardiomegaly is again identified. An endotracheal tube and nasogastric catheter are again identified and stable. A new left jugular central line is noted with the tip near the proximal superior vena cava. A right jugular line is noted within the right innominate vein. No pneumothorax is noted. Bilateral pleural effusions right greater than left are seen. There is likely some underlying atelectasis present.  IMPRESSION: Bilateral pleural effusions.  Tubes and lines as  described above without pneumothorax.   Electronically Signed   By: Alcide Clever M.D.   On: 01/10/2015 14:10    I have reviewed the patient's current medications.  Assessment/Plan:  59 yo male with hx of HTN, HLD, here after being found unresponsive, found to be in  Shock likely 2/2 PE, also has sepsis.   1. Shock - likely obstructive shock from possible PE (RA and RV dilated, RV hypocontractile on echo), but could also be from possible sepsis (PCT high, tmax 101.1, on abx) - on heparin drip, cardiology on board. CT angio deferred until renal improvement. Off pressor today. 2. Oliguric AKI on CKD 2 - likely 2/2 to shock. Renal u/s shows chronic medial renal disease.  Failed trial of lasix. On CRRT since 01/10/15.  3. Hypoxic resp failure - likely 2/2 to PE, also does have pulm edema and pulm venous congestion. Remains on vent. 4. shocked Liver - AST/ALT slowly coming down.  5. s/p exp lap and also fascia closure. Has wound vac.  6. Gap metabolic acidosis - likely 2/2 to lactic acidosis + renal failure - in the setting of shock improving.  7. Elevated Trop -  Was >60, now down trending- no ST changes on EKG. cards following and taking him to cath lab today. 8. Bradycardia - improved, likely was 2/2 to post-anesthesia.  given trop elevation, cards planning to take to cath Lab tomorrow. No ST elevation currenltly.  9. Hypokalemia - repleted.  Plan:  1. Continue CRRT. Changed setting to get relatively faster mprovement of crt, however, this is going to be a slow process overall. Being off pressor may also help renal perfusion.  2. Rest per primary team.     LOS: 4 days   Ahmed, Tasrif 01/12/2015,11:54 AM  I have seen and examined this patient and agree with plan as outlined by Dr. Tasia Catchings.  Plan for cardiac cath today and will resume CVVHD once he returns from cath lab.  Off of pressors and will continue to follow closely.  Bradycardia has resolved await cath results. Novah Nessel A,MD 01/12/2015 1:44 PM

## 2015-01-12 NOTE — Progress Notes (Signed)
Patient Name: Jared [name] Date of Encounter: 01/12/2015  Active Problems:   Septic shock   Acute respiratory failure with hypoxia   Shock   Acute respiratory failure with hypoxemia   Cardiogenic shock   AKI (acute kidney injury)   Length of Stay: 4  SUBJECTIVE  Intubated, sedated, on CRRT Anuric Off pressors  CURRENT MEDS . antiseptic oral rinse  7 mL Mouth Rinse QID  . aspirin  81 mg Oral Pre-Cath  . aspirin  300 mg Rectal Daily  . chlorhexidine gluconate  15 mL Mouth Rinse BID  . insulin aspart  0-9 Units Subcutaneous 6 times per day  . pantoprazole (PROTONIX) IV  40 mg Intravenous QHS  . piperacillin-tazobactam (ZOSYN)  IV  2.25 g Intravenous 4 times per day  . sodium chloride  3 mL Intravenous Q12H  . vancomycin  1,250 mg Intravenous Q24H    OBJECTIVE   Intake/Output Summary (Last 24 hours) at 01/12/15 1031 Last data filed at 01/12/15 1000  Gross per 24 hour  Intake 2406.54 ml  Output   3152 ml  Net -745.46 ml   Filed Weights   01/10/15 0515 01/11/15 0415 01/12/15 0430  Weight: 299 lb 9.7 oz (135.9 kg) 300 lb 14.9 oz (136.5 kg) 302 lb 4 oz (137.1 kg)    PHYSICAL EXAM Filed Vitals:   01/12/15 0700 01/12/15 0740 01/12/15 0800 01/12/15 0813  BP:  126/59    Pulse: 67 72    Temp:   98.7 F (37.1 C) 98.7 F (37.1 C)  TempSrc:   Oral Oral  Resp: 25 26    Weight:      SpO2: 100% 99%     General: intubated, sedated Head: no evidence of trauma, PERRL, EOMI, no exophtalmos or lid lag, no myxedema, no xanthelasma; normal ears, nose and oropharynx Neck: normal jugular venous pulsations and no hepatojugular reflux; brisk carotid pulses without delay and no carotid bruits Chest: clear to auscultation, no signs of consolidation by percussion or palpation, normal fremitus, symmetrical and full respiratory excursions Cardiovascular: normal position and quality of the apical impulse, regular rhythm, normal first and second heart sounds, no rubs or gallops,  nourmur Abdomen: no tenderness or distention, no masses by palpation, no abnormal pulsatility or arterial bruits, normal bowel sounds, no hepatosplenomegaly Extremities: no clubbing, cyanosis or edema; 2+ radial, ulnar and brachial pulses bilaterally; 2+ right femoral, posterior tibial and dorsalis pedis pulses; 2+ left femoral, posterior tibial and dorsalis pedis pulses; no subclavian or femoral bruits Neurological: cannot perform  LABS  CBC  Recent Labs  01/11/15 0413 01/12/15 0414  WBC 19.9* 14.9*  HGB 14.5 14.0  HCT 39.3 38.6*  MCV 85.8 85.2  PLT 121* 102*   Basic Metabolic Panel  Recent Labs  01/11/15 0413 01/11/15 1215 01/12/15 0414  NA 133* 134* 137  K 3.2* 3.4* 4.0  CL 98* 99* 103  CO2 20* 20* 23  GLUCOSE 143* 119* 87  BUN 67* 65* 59*  CREATININE 8.43* 8.55* 8.03*  CALCIUM 6.7* 6.7* 7.0*  MG 2.4 2.4  --   PHOS 3.4 4.3  --    Liver Function Tests  Recent Labs  01/11/15 0413 01/12/15 0414  AST 1601* 686*  ALT 2981* 2171*  ALKPHOS 82 79  BILITOT 2.5* 3.5*  PROT 3.9* 4.3*  ALBUMIN 2.0* 2.0*    Recent Labs  01/10/15 2342 01/12/15 0414  LIPASE 43 80*  AMYLASE 198* 218*   Cardiac Enzymes  Recent Labs  01/09/15 1512 01/09/15 2129  01/11/15 1215  TROPONINI >65.00* 49.60* 4.71*   Fasting Lipid Panel  Recent Labs  01/10/15 0157  TRIG 324*   Thyroid Function Tests No results for input(s): TSH, T4TOTAL, T3FREE, THYROIDAB in the last 72 hours.  Invalid input(s): FREET3  Radiology Studies Imaging results have been reviewed and Dg Chest [facility]  01/12/2015   CLINICAL DATA:  Respiratory failure.  EXAM: PORTABLE CHEST - 1 VIEW  COMPARISON:  01/11/2015.  FINDINGS: Endotracheal tube, NG tube, bilateral IJ lines in stable position. Cardiomegaly with pulmonary vascular prominence and bilateral pulmonary alveolar infiltrates consistent with bilateral pulmonary edema. Bilateral pleural effusions are noted. No pneumothorax. Prior cervical spine  fusion.  IMPRESSION: 1. Lines and tubes in stable position. 2. Cardiomegaly with bilateral pulmonary alveolar infiltrates and pleural effusions consistent with congestive heart failure. Bilateral pneumonia cannot be excluded . No and improvement from prior exam.   Electronically Signed   By: Maisie Fus  Register   On: 01/12/2015 07:13   Dg Chest Port 1 View  01/11/2015   CLINICAL DATA:  Acute respiratory failure with hypoxia. On ventilator. Septic shock.  EXAM: PORTABLE CHEST - 1 VIEW  COMPARISON:  01/10/2015  FINDINGS: Support lines and tubes in appropriate position. Cardiomegaly stable. Bibasilar pulmonary opacity is seen likely representing combination of pleural effusions and infiltrate or atelectasis. These findings are unchanged. No evidence of pneumothorax.  IMPRESSION: No significant change compared with prior exam.   Electronically Signed   By: Myles Rosenthal M.D.   On: 01/11/2015 07:50   Dg Chest Port 1 View  01/10/2015   CLINICAL DATA:  Acute respiratory failure with hypoxemia  EXAM: PORTABLE CHEST - 1 VIEW  COMPARISON:  01/10/2015  FINDINGS: Cardiomegaly is again identified. An endotracheal tube and nasogastric catheter are again identified and stable. A new left jugular central line is noted with the tip near the proximal superior vena cava. A right jugular line is noted within the right innominate vein. No pneumothorax is noted. Bilateral pleural effusions right greater than left are seen. There is likely some underlying atelectasis present.  IMPRESSION: Bilateral pleural effusions.  Tubes and lines as described above without pneumothorax.   Electronically Signed   By: Alcide Clever M.D.   On: 01/10/2015 14:10    TELE NSR, rare PVC  ECG Last ECG sinus brady, long QT  ASSESSMENT AND PLAN  (Cardiogenic?) shock - improved, off pressors. For cardiac cath today. Note inferior hypokinesis and severe RV dysfunction. Consider RCA occlusion, but ECG remarkably silent. NSTEMI with severe troponin  elevation after resuscitation (global ischemia versus RCA infarction) Bradycardia, resolved. Might also be RCA disease related, but possibly due to amiodarone given for VT/VF Acute renal failure on CRRT. Still essentially anuric S/P cardiorespiratory arrest - seems that a pulmonary event triggered this. Initially improved with narcan and BiPAP, developed ventricular arrhythmia after pressors were initiated for hypotension   [name] Fair, MD, [facility] [phone] office [phone] pager 01/12/2015 10:31 AM

## 2015-01-12 NOTE — Progress Notes (Signed)
Patient ID: Jared [name], male   DOB: [birth year], 59 y.o.   MRN: [id number] 1 Day Post-Op  Subjective: Pt intubated and arouses to pain.  Appears the patient is going for cath today.  On CRRT  Objective: Vital signs in last 24 hours: Temp:  [97.6 F (36.4 C)-99.1 F (37.3 C)] 98.7 F (37.1 C) (08/22 0813) Pulse Rate:  [41-72] 72 (08/22 0740) Resp:  [22-26] 26 (08/22 0740) BP: (84-126)/(39-63) 126/59 mmHg (08/22 0740) SpO2:  [93 %-100 %] 99 % (08/22 0740) Arterial Line BP: (85-284)/(42-279) 117/55 mmHg (08/22 0700) FiO2 (%):  [40 %-50 %] 40 % (08/22 0800) Weight:  [137.1 kg (302 lb 4 oz)] 137.1 kg (302 lb 4 oz) (08/22 0430)    Intake/Output from previous day: 08/21 0701 - 08/22 0700 In: 3018.8 [I.V.:2154.8; NG/GT:150; IV Piggyback:684] Out: 2832 [Urine:52; Emesis/NG output:750; Drains:50; Blood:25] Intake/Output this shift: Total I/O In: 80 [I.V.:50; NG/GT:30] Out: 129 [Urine:10; Other:119]  PE: Abd: soft, tender to palpation appropriately, wound VAC (skin VAC) with minimal serosang output (50cc yesterday), NGT with bilious output, absent BS  Lab Results:   Recent Labs  01/11/15 0413 01/12/15 0414  WBC 19.9* 14.9*  HGB 14.5 14.0  HCT 39.3 38.6*  PLT 121* 102*   BMET  Recent Labs  01/11/15 1215 01/12/15 0414  NA 134* 137  K 3.4* 4.0  CL 99* 103  CO2 20* 23  GLUCOSE 119* 87  BUN 65* 59*  CREATININE 8.55* 8.03*  CALCIUM 6.7* 7.0*   PT/INR  Recent Labs  01/11/15 0413 01/12/15 0801  LABPROT 19.8* 16.7*  INR 1.68* 1.34   CMP     Component Value Date/Time   NA 137 01/12/2015 0414   K 4.0 01/12/2015 0414   CL 103 01/12/2015 0414   CO2 23 01/12/2015 0414   GLUCOSE 87 01/12/2015 0414   BUN 59* 01/12/2015 0414   CREATININE 8.03* 01/12/2015 0414   CREATININE 1.15 12/17/2014 1707   CALCIUM 7.0* 01/12/2015 0414   PROT 4.3* 01/12/2015 0414   ALBUMIN 2.0* 01/12/2015 0414   AST 686* 01/12/2015 0414   ALT 2171* 01/12/2015 0414   ALKPHOS 79 01/12/2015  0414   BILITOT 3.5* 01/12/2015 0414   GFRNONAA 6* 01/12/2015 0414   GFRNONAA 68 12/17/2014 1707   GFRAA 7* 01/12/2015 0414   GFRAA 79 12/17/2014 1707   Lipase     Component Value Date/Time   LIPASE 80* 01/12/2015 0414       Studies/Results: Dg Chest Port 1 View  01/12/2015   CLINICAL DATA:  Respiratory failure.  EXAM: PORTABLE CHEST - 1 VIEW  COMPARISON:  01/11/2015.  FINDINGS: Endotracheal tube, NG tube, bilateral IJ lines in stable position. Cardiomegaly with pulmonary vascular prominence and bilateral pulmonary alveolar infiltrates consistent with bilateral pulmonary edema. Bilateral pleural effusions are noted. No pneumothorax. Prior cervical spine fusion.  IMPRESSION: 1. Lines and tubes in stable position. 2. Cardiomegaly with bilateral pulmonary alveolar infiltrates and pleural effusions consistent with congestive heart failure. Bilateral pneumonia cannot be excluded . No and improvement from prior exam.   Electronically Signed   By: Maisie Fus  Register   On: 01/12/2015 07:13   Dg Chest Port 1 View  01/11/2015   CLINICAL DATA:  Acute respiratory failure with hypoxia. On ventilator. Septic shock.  EXAM: PORTABLE CHEST - 1 VIEW  COMPARISON:  01/10/2015  FINDINGS: Support lines and tubes in appropriate position. Cardiomegaly stable. Bibasilar pulmonary opacity is seen likely representing combination of pleural effusions and infiltrate or atelectasis. These findings  are unchanged. No evidence of pneumothorax.  IMPRESSION: No significant change compared with prior exam.   Electronically Signed   By: Myles Rosenthal M.D.   On: 01/11/2015 07:50   Dg Chest Port 1 View  01/10/2015   CLINICAL DATA:  Acute respiratory failure with hypoxemia  EXAM: PORTABLE CHEST - 1 VIEW  COMPARISON:  01/10/2015  FINDINGS: Cardiomegaly is again identified. An endotracheal tube and nasogastric catheter are again identified and stable. A new left jugular central line is noted with the tip near the proximal superior vena  cava. A right jugular line is noted within the right innominate vein. No pneumothorax is noted. Bilateral pleural effusions right greater than left are seen. There is likely some underlying atelectasis present.  IMPRESSION: Bilateral pleural effusions.  Tubes and lines as described above without pneumothorax.   Electronically Signed   By: Alcide Clever M.D.   On: 01/10/2015 14:10    Anti-infectives: Anti-infectives    Start     Dose/Rate Route Frequency Ordered Stop   01/11/15 1630  vancomycin (VANCOCIN) 1,250 mg in sodium chloride 0.9 % 250 mL IVPB     1,250 mg 166.7 mL/hr over 90 Minutes Intravenous Every 24 hours 01/11/15 1029     01/10/15 1300  vancomycin (VANCOCIN) 1,750 mg in sodium chloride 0.9 % 500 mL IVPB  Status:  Discontinued     1,750 mg 250 mL/hr over 120 Minutes Intravenous Every 48 hours 01/08/15 1327 01/09/15 1537   01/10/15 1300  vancomycin (VANCOCIN) 1,250 mg in sodium chloride 0.9 % 250 mL IVPB  Status:  Discontinued     1,250 mg 166.7 mL/hr over 90 Minutes Intravenous Every 24 hours 01/10/15 0947 01/11/15 1029   01/10/15 1200  piperacillin-tazobactam (ZOSYN) IVPB 2.25 g     2.25 g 100 mL/hr over 30 Minutes Intravenous 4 times per day 01/10/15 0929     01/09/15 1400  piperacillin-tazobactam (ZOSYN) IVPB 2.25 g  Status:  Discontinued     2.25 g 100 mL/hr over 30 Minutes Intravenous 3 times per day 01/09/15 0908 01/10/15 0929   01/09/15 1100  cefTRIAXone (ROCEPHIN) 1 g in dextrose 5 % 50 mL IVPB  Status:  Discontinued     1 g 100 mL/hr over 30 Minutes Intravenous Every 24 hours 01/08/15 1133 01/08/15 1327   01/09/15 1100  azithromycin (ZITHROMAX) 500 mg in dextrose 5 % 250 mL IVPB  Status:  Discontinued     500 mg 250 mL/hr over 60 Minutes Intravenous Every 24 hours 01/08/15 1133 01/08/15 1327   01/08/15 2200  piperacillin-tazobactam (ZOSYN) IVPB 3.375 g  Status:  Discontinued     3.375 g 12.5 mL/hr over 240 Minutes Intravenous 3 times per day 01/08/15 1327 01/09/15 0908    01/08/15 1400  vancomycin (VANCOCIN) 2,500 mg in sodium chloride 0.9 % 500 mL IVPB     2,500 mg 250 mL/hr over 120 Minutes Intravenous  Once 01/08/15 1327 01/08/15 1747   01/08/15 1330  piperacillin-tazobactam (ZOSYN) IVPB 3.375 g  Status:  Discontinued     3.375 g 100 mL/hr over 30 Minutes Intravenous  Once 01/08/15 1316 01/10/15 0929   01/08/15 1130  cefTRIAXone (ROCEPHIN) 1 g in dextrose 5 % 50 mL IVPB     1 g 100 mL/hr over 30 Minutes Intravenous  Once 01/08/15 1126 01/08/15 1220   01/08/15 1130  azithromycin (ZITHROMAX) 500 mg in dextrose 5 % 250 mL IVPB     500 mg 250 mL/hr over 60 Minutes Intravenous  Once 01/08/15  1126 01/08/15 1322       Assessment/Plan  POD 4, 1 s/p ex lap with open abdominal VAC placement and then closure -patient has an ileus.  Would cont NGT to LIWS for now.  When his function returns, he can be started on TFs if still intubated. -skin VAC changes TTS MMP -per primary service and other specialties -cardiac cath today per notes DVT prophylaxis -SCDs/heparin drip   LOS: 4 days    Opel Lejeune E 01/12/2015, 9:21 AM Pager: [phone]

## 2015-01-12 NOTE — Progress Notes (Addendum)
ANTICOAGULATION CONSULT NOTE Pharmacy Consult for Heparin Indication: chest pain/ACS  Allergies  Allergen Reactions  . Morphine And Related Anaphylaxis and Shortness Of Breath  . Montelukast Other (See Comments)    unknown  . Neurontin [Gabapentin] Other (See Comments)    delusional  . Wellbutrin [Bupropion] Palpitations    Insomnia    Patient Measurements: Weight: (!) 302 lb 4 oz (137.1 kg)  Vital Signs: Temp: 98.7 F (37.1 C) (08/22 0813) Temp Source: Oral (08/22 0813) BP: 126/59 mmHg (08/22 0740) Pulse Rate: 72 (08/22 0740)  Labs:  Recent Labs  01/09/15 1132 01/09/15 1512  01/09/15 1630 01/09/15 2129 01/10/15 0330 01/10/15 2340 01/11/15 0413 01/11/15 1215 01/12/15 0414 01/12/15 0801  HGB  --   --   < > 15.6  --  14.5  --  14.5  --  14.0  --   HCT  --   --   < > 43.1  --  41.2  --  39.3  --  38.6*  --   PLT  --   --   < > 141*  --  161  --  121*  --  102*  --   LABPROT  --   --   --  24.6*  --   --   --  19.8*  --   --  16.7*  INR  --   --   --  2.24*  --   --   --  1.68*  --   --  1.34  HEPARINUNFRC 0.35  --   --   --   --   --  0.23*  --   --   --  0.33  CREATININE  --   --   < > 7.58*  --  8.72*  --  8.43* 8.55* 8.03*  --   TROPONINI  --  >65.00*  --   --  49.60*  --   --   --  4.71*  --   --   < > = values in this interval not displayed.  Estimated Creatinine Clearance: 14.3 mL/min (by C-G formula based on Cr of 8.03).  Assessment: 59 y.o. male with shock/elevated cardiac markers, possible PE, now s/p abdominal closure. Heparin for chest pain/ACS  HL today 0.33  Goal of Therapy:  Heparin level 0.3-0.5 units/ml Monitor platelets by anticoagulation protocol: Yes   Plan:  Continue Heparin 2250 units/hr 1700 HL (to confirm) Daily HL/CBC   Meagan C. Marvis Moeller, PharmD Pharmacy Resident  Pager: [phone] 01/12/2015 9:30 AM   ___________________________________________________ ADDENDUM: Heparin level remains therapeutic now x2 at current rate of  2250 units/hr.  Per RN, heparin drip was turned off for about after HL was drawn in preparation for cath.  Pt now is NOT planning on going to cath this evening.  Will resume heparin at previous rate. No bleeding or other complications reported by RN.  Plan: -Resume heparin drip at 2250 units/hr -Monitor daily HL, CBC, s/sx of bleeding -F/u plans for cath  Waynette Buttery, PharmD Clinical Pharmacist Pager: [phone] 01/12/2015 6:08 PM

## 2015-01-12 NOTE — Progress Notes (Signed)
PULMONARY / CRITICAL CARE MEDICINE   Name: Jared [name] MRN: [id number] DOB: 1955/12/04    ADMISSION DATE:  01/08/2015 CONSULTATION DATE:  8/18  REFERRING MD :  EDP Dr Criss Alvine  CHIEF COMPLAINT:  Found down  INITIAL PRESENTATION:   59 yo male had severe abdominal pain, and then found unresponsive at home.  In ER he was in shock, cyanotic, with lactic acidosis, and hyperkalemia.  STUDIES:  8/18 Echo >> EF 45 to 50%, mod RV dilation, mod/severe RV dysfx, PAS 47 mmHg 8/18 Renal u/s >> b/l renal echogenicity  SIGNIFICANT EVENTS: 8/18 Admit, CCS, cardiology consulted; brief PEA arrest; laparotomy with placement of wound vac; started heparin gtt for presumed PE 8/19 Renal consulted 8/20 CRRT  8/21 OR for abd wound closure    8/21 :  To OR this am with abd wound closure , did well . CRRT started yesterday , no change in scr or UOP.  Bradycardia overnight with Amio stopped. HR improved but remains in upper 40s. Remains on pressors with Levo and Vaso , decreased demands     SUBJECTIVE/OVERNIGHT/INTERVAL HX 01/12/15; For cath later 01/12/2015 per Dr Jomarie Longs. On pressors. Anuric. On CRRT. Off pressors. Improved Shock Liver. On fent gtt and heparing gtt. 40% fio2  VITAL SIGNS: Temp:  [97.6 F (36.4 C)-99.1 F (37.3 C)] 98.7 F (37.1 C) (08/22 0813) Pulse Rate:  [41-72] 72 (08/22 0740) Resp:  [22-26] 26 (08/22 0740) BP: (84-126)/(41-63) 126/59 mmHg (08/22 0740) SpO2:  [96 %-100 %] 99 % (08/22 0740) Arterial Line BP: (85-284)/(42-279) 117/55 mmHg (08/22 0700) FiO2 (%):  [40 %-50 %] 40 % (08/22 0800) Weight:  [137.1 kg (302 lb 4 oz)] 137.1 kg (302 lb 4 oz) (08/22 0430) HEMODYNAMICS: CVP:  [9 mmHg-14 mmHg] 10 mmHg VENTILATOR SETTINGS: Vent Mode:  [-] PSV;CPAP FiO2 (%):  [40 %-50 %] 40 % Set Rate:  [16 bmp] 16 bmp Vt Set:  [670 mL] 670 mL PEEP:  [5 cmH20] 5 cmH20 Pressure Support:  [5 cmH20] 5 cmH20 Plateau Pressure:  [24 cmH20-31 cmH20] 24 cmH20 INTAKE / OUTPUT:  Intake/Output  Summary (Last 24 hours) at 01/12/15 1056 Last data filed at 01/12/15 1000  Gross per 24 hour  Intake 2406.54 ml  Output   3152 ml  Net -745.46 ml    PHYSICAL EXAMINATION: General: critically ill appearing on vent  Neuro: sedated moves extremities. RASS -3 on fent gtt HEENT: ETT in place Cardiovascular: SB , no murmur Lungs: faint basilar crackles Abdomen: mild distention, wound vac in place Musculoskeletal: no edema  LABS:  PULMONARY  Recent Labs Lab 01/09/15 0008 01/09/15 0410 01/09/15 1901 01/10/15 0408 01/10/15 1615 01/11/15 1131  PHART 7.390 7.437 7.339* 7.348*  --  7.439  PCO2ART 35.6 32.4* 45.3* 41.9  --  30.4*  PO2ART 118* 99.8 88.0 106*  --  79.8*  HCO3 21.0 21.7 24.0 22.2  --  20.3  TCO2 22.1 22.7 25 23.4  --  21.2  O2SAT 97.3 96.7 95.0 96.7 76.4 95.1    CBC  Recent Labs Lab 01/10/15 0330 01/11/15 0413 01/12/15 0414  HGB 14.5 14.5 14.0  HCT 41.2 39.3 38.6*  WBC 23.0* 19.9* 14.9*  PLT 161 121* 102*    COAGULATION  Recent Labs Lab 01/08/15 1350 01/09/15 1630 01/11/15 0413 01/12/15 0801  INR 1.54* 2.24* 1.68* 1.34    CARDIAC   Recent Labs Lab 01/09/15 0149 01/09/15 0914 01/09/15 1512 01/09/15 2129 01/11/15 1215  TROPONINI >65.00* >65.00* >65.00* 49.60* 4.71*   No results for  input(s): PROBNP in the last 168 hours.   CHEMISTRY  Recent Labs Lab 01/08/15 1350  01/09/15 0148 01/09/15 1630 01/10/15 0330 01/11/15 0413 01/11/15 1215 01/12/15 0414  NA  --   < > 134* 137 135 133* 134* 137  K  --   < > 3.8 3.6 3.8 3.2* 3.4* 4.0  CL  --   < > 98* 96* 96* 98* 99* 103  CO2  --   < > 20* 24 21* 20* 20* 23  GLUCOSE  --   < > 113* 106* 127* 143* 119* 87  BUN  --   < > 48* 61* 71* 67* 65* 59*  CREATININE  --   < > 5.34* 7.58* 8.72* 8.43* 8.55* 8.03*  CALCIUM  --   < > 7.6* 7.1* 6.8* 6.7* 6.7* 7.0*  MG 1.9  --  2.5*  --  2.2 2.4 2.4  --   PHOS  --   --  3.7  --  4.6 3.4 4.3  --   < > = values in this interval not  displayed. Estimated Creatinine Clearance: 14.3 mL/min (by C-G formula based on Cr of 8.03).   LIVER  Recent Labs Lab 01/08/15 1100 01/08/15 1350 01/09/15 1630 01/10/15 0330 01/11/15 0413 01/12/15 0414 01/12/15 0801  AST 626*  --  4557* 2907* 1601* 686*  --   ALT 729*  --  4100* 3366* 2981* 2171*  --   ALKPHOS 73  --  83 74 82 79  --   BILITOT 1.3*  --  1.7* 1.7* 2.5* 3.5*  --   PROT 7.0  --  4.1* 3.7* 3.9* 4.3*  --   ALBUMIN 4.1  --  2.4* 2.1* 2.0* 2.0*  --   INR  --  1.54* 2.24*  --  1.68*  --  1.34     INFECTIOUS  Recent Labs Lab 01/09/15 0130 01/09/15 0945 01/09/15 1630 01/10/15 0330 01/11/15 0413  LATICACIDVEN 3.5*  --  1.7 1.5  --   PROCALCITON  --  40.68  --  37.87 25.12     ENDOCRINE CBG (last 3)   Recent Labs  01/11/15 1940 01/11/15 2352 01/12/15 0429  GLUCAP 86 82 83         IMAGING x48h  - image(s) personally visualized  -   highlighted in bold Dg Chest Port 1 View  01/12/2015   CLINICAL DATA:  Respiratory failure.  EXAM: PORTABLE CHEST - 1 VIEW  COMPARISON:  01/11/2015.  FINDINGS: Endotracheal tube, NG tube, bilateral IJ lines in stable position. Cardiomegaly with pulmonary vascular prominence and bilateral pulmonary alveolar infiltrates consistent with bilateral pulmonary edema. Bilateral pleural effusions are noted. No pneumothorax. Prior cervical spine fusion.  IMPRESSION: 1. Lines and tubes in stable position. 2. Cardiomegaly with bilateral pulmonary alveolar infiltrates and pleural effusions consistent with congestive heart failure. Bilateral pneumonia cannot be excluded . No and improvement from prior exam.   Electronically Signed   By: Maisie Fus  Register   On: 01/12/2015 07:13   Dg Chest Port 1 View  01/11/2015   CLINICAL DATA:  Acute respiratory failure with hypoxia. On ventilator. Septic shock.  EXAM: PORTABLE CHEST - 1 VIEW  COMPARISON:  01/10/2015  FINDINGS: Support lines and tubes in appropriate position. Cardiomegaly stable. Bibasilar  pulmonary opacity is seen likely representing combination of pleural effusions and infiltrate or atelectasis. These findings are unchanged. No evidence of pneumothorax.  IMPRESSION: No significant change compared with prior exam.   Electronically Signed   By: Jonny Ruiz  Eppie Gibson M.D.   On: 01/11/2015 07:50   Dg Chest Port 1 View  01/10/2015   CLINICAL DATA:  Acute respiratory failure with hypoxemia  EXAM: PORTABLE CHEST - 1 VIEW  COMPARISON:  01/10/2015  FINDINGS: Cardiomegaly is again identified. An endotracheal tube and nasogastric catheter are again identified and stable. A new left jugular central line is noted with the tip near the proximal superior vena cava. A right jugular line is noted within the right innominate vein. No pneumothorax is noted. Bilateral pleural effusions right greater than left are seen. There is likely some underlying atelectasis present.  IMPRESSION: Bilateral pleural effusions.  Tubes and lines as described above without pneumothorax.   Electronically Signed   By: Alcide Clever M.D.   On: 01/10/2015 14:10         ASSESSMENT / PLAN:  PULMONARY ETT 8/18 >>> A: Acute respiratory failure 2nd to presumed PE, lactic acidosis. Hx of OSA    - does not meet sbt criteria 01/12/2015.  P:   Full vent support F/u CXR, ABG Adjust PEEP/FiO2 to keep SpO2 > 92% VAP precautions     CARDIOVASCULAR Rt femoral CVL 8/18 >>8/20 Lt femoral A line 8/18 >> RIJ HD 8/20 >> LIJ 8/20 >>  A: Shock >> likely cardiogenic. RV failure with possible PE.-although venous doppler neg for DVT ? MI related  Hx of HTN, HLD. Atrial fib  Probable Acute MI (w/ ST elevation /elevation troponin )     - Off amio ? Date. Off pressors since 01/12/2015 AM. On amio gtt and heparin gtt. For cath 01/12/2015   P:  Cath 01/12/2015 Cotnnue Hep drip  Defer CT chest with contrast due to concern for renal dysfx./When stable could consider VQ scan  Hold outpt lipitor, bisoprolol, HCTZ, losartan,  minoxidil  RENAL AKI >> baseline creatinine 1.14 from 12/17/14., renal US neg >renal function is worse with decreased UOP >CRRT started per renal 8/20  Hyperkalemia >> resolved  Hyponatremia. Metabolic acidosis.>improved  Hx of BPH. Hypokalemia    - On CRRT. Wife aware renal prognosis is worse after Cath but accepts risk  V benefit ratio  P:   CRRT per Renal 8/20  KVO  IVF   GASTROINTESTINAL A: Acute abdominal pain s/p laparotomy with wound vac 8/18.>wound vac change 8/19 d/t eviceration>to OR 8/21 with wound closure  Shock Liver >LFT tr down   - TF still not started. CCS holding off due to ileus  P:   Post-op care per surgery  Protonix for SUP NGT to LIS per CCS Wound VAC with changes tTS TF start decision per CCS   HEMATOLOGIC A: Coagulopathy with elevated INR probably secnodary to shock liver >improving with INR tr down  P:  F/u CBC and INR   INFECTIOUS A: Sepsis with presumed abdominal source.>exp lap was neg .  Temp and WBC tr up  >Now w/ wound vac will need to cont abx for now . Follow Cx and PCT ,  P:   Day 3 vancomycin, zosyn  Blood 8/18 >> Urine 8/18 >>NEG   ENDOCRINE A: Hyperglycemia  P:   SSI   NEUROLOGIC A: Acute metabolic encephalopathy >> improved 8/18.>follows commands off sedation  Hx of chronic back pain, anxiety, depression.   - on sedation gtt P:   RASS goal: -1  Wean sedation as able  Hold outpt xanax, celexa, oxycodone, lyrica   FAMILY Updates - wife updated at bedside 01/12/2015 with presence of RN Darel Hong      The  patient is critically ill with multiple organ systems failure and requires high complexity decision making for assessment and support, frequent evaluation and titration of therapies, application of advanced monitoring technologies and extensive interpretation of multiple databases.   Critical Care Time devoted to patient care services described in this note is  35  Minutes. This time reflects time of care  of this signee Dr Kalman Shan. This critical care time does not reflect procedure time, or teaching time or supervisory time of PA/NP/Med student/Med Resident etc but could involve care discussion time    Dr. Kalman Shan, M.D., [facility].C.P Pulmonary and Critical Care Medicine Staff Physician [facility]  Pulmonary and Critical Care Pager: [phone], If no answer or between  15:00h - 7:00h: call [phone]  01/12/2015 11:07 AM

## 2015-01-12 NOTE — Progress Notes (Signed)
ANTICOAGULATION CONSULT NOTE Pharmacy Consult for Heparin Indication: chest pain/ACS  Allergies  Allergen Reactions  . Morphine And Related Anaphylaxis and Shortness Of Breath  . Montelukast Other (See Comments)    unknown  . Neurontin [Gabapentin] Other (See Comments)    delusional  . Wellbutrin [Bupropion] Palpitations    Insomnia    Patient Measurements: Weight: (!) 300 lb 14.9 oz (136.5 kg)  Vital Signs: Temp: 99.1 F (37.3 C) (08/21 2357) Temp Source: Oral (08/21 2000) BP: 112/59 mmHg (08/22 0000) Pulse Rate: 66 (08/22 0015)  Labs:  Recent Labs  01/09/15 0149  01/09/15 1132 01/09/15 1512 01/09/15 1630 01/09/15 2129 01/10/15 0330 01/10/15 2340 01/11/15 0413 01/11/15 1215  HGB  --   --   --   --  15.6  --  14.5  --  14.5  --   HCT  --   --   --   --  43.1  --  41.2  --  39.3  --   PLT  --   --   --   --  141*  --  161  --  121*  --   LABPROT  --   --   --   --  24.6*  --   --   --  19.8*  --   INR  --   --   --   --  2.24*  --   --   --  1.68*  --   HEPARINUNFRC 0.18*  --  0.35  --   --   --   --  0.23*  --   --   CREATININE  --   --   --   --  7.58*  --  8.72*  --  8.43* 8.55*  TROPONINI >65.00*  < >  --  >65.00*  --  49.60*  --   --   --  4.71*  < > = values in this interval not displayed.  Estimated Creatinine Clearance: 13.4 mL/min (by C-G formula based on Cr of 8.55).  Assessment: 60 y.o. male with shock/elevated cardiac markers, possible PE, now s/p abdominal closure, for heparin  Goal of Therapy:  Heparin level 0.3-0.5 units/ml Monitor platelets by anticoagulation protocol: Yes   Plan:  Increase Heparin 2250 units/hr Follow-up am labs.     Melea Prezioso, Gary Fleet 01/12/2015,12:27 AM

## 2015-01-13 ENCOUNTER — Encounter (HOSPITAL_COMMUNITY): Payer: Self-pay | Admitting: Surgery

## 2015-01-13 ENCOUNTER — Inpatient Hospital Stay (HOSPITAL_COMMUNITY): Payer: Medicare Other

## 2015-01-13 ENCOUNTER — Encounter (HOSPITAL_COMMUNITY)
Admission: EM | Disposition: A | Discharge status: Nursing Home (Includes ICF) | Payer: Medicare Other | Source: Home / Self Care | Attending: Pulmonary Disease

## 2015-01-13 DIAGNOSIS — R609 Edema, unspecified: Secondary | ICD-10-CM

## 2015-01-13 DIAGNOSIS — I214 Non-ST elevation (NSTEMI) myocardial infarction: Secondary | ICD-10-CM

## 2015-01-13 DIAGNOSIS — I252 Old myocardial infarction: Secondary | ICD-10-CM | POA: Diagnosis present

## 2015-01-13 DIAGNOSIS — D696 Thrombocytopenia, unspecified: Secondary | ICD-10-CM

## 2015-01-13 DIAGNOSIS — N19 Unspecified kidney failure: Secondary | ICD-10-CM

## 2015-01-13 DIAGNOSIS — N179 Acute kidney failure, unspecified: Secondary | ICD-10-CM

## 2015-01-13 HISTORY — PX: CARDIAC CATHETERIZATION: SHX172

## 2015-01-13 LAB — RENAL FUNCTION PANEL
ALBUMIN: 2.1 g/dL — AB (ref 3.5–5.0)
ALBUMIN: 1.9 g/dL — AB (ref 3.5–5.0)
ANION GAP: 13 (ref 5–15)
Anion gap: 10 (ref 5–15)
BUN: 54 mg/dL — ABNORMAL HIGH (ref 6–20)
BUN: 58 mg/dL — AB (ref 6–20)
CALCIUM: 7.6 mg/dL — AB (ref 8.9–10.3)
CHLORIDE: 104 mmol/L (ref 101–111)
CO2: 22 mmol/L (ref 22–32)
CO2: 22 mmol/L (ref 22–32)
CREATININE: 6.93 mg/dL — AB (ref 0.61–1.24)
Calcium: 7.5 mg/dL — ABNORMAL LOW (ref 8.9–10.3)
Chloride: 102 mmol/L (ref 101–111)
Creatinine, Ser: 6.72 mg/dL — ABNORMAL HIGH (ref 0.61–1.24)
GFR calc Af Amer: 9 mL/min — ABNORMAL LOW (ref >60)
GFR calc non Af Amer: 8 mL/min — ABNORMAL LOW (ref >60)
GFR, EST AFRICAN AMERICAN: 9 mL/min — AB (ref >60)
GFR, EST NON AFRICAN AMERICAN: 8 mL/min — AB (ref >60)
GLUCOSE: 114 mg/dL — AB (ref 65–99)
GLUCOSE: 77 mg/dL (ref 65–99)
PHOSPHORUS: 4.6 mg/dL (ref 2.5–4.6)
Phosphorus: 4 mg/dL (ref 2.5–4.6)
Potassium: 4.1 mmol/L (ref 3.5–5.1)
Potassium: 4.1 mmol/L (ref 3.5–5.1)
SODIUM: 137 mmol/L (ref 135–145)
Sodium: 136 mmol/L (ref 135–145)

## 2015-01-13 LAB — GLUCOSE, CAPILLARY
GLUCOSE-CAPILLARY: 78 mg/dL (ref 65–99)
GLUCOSE-CAPILLARY: 80 mg/dL (ref 65–99)
Glucose-Capillary: 112 mg/dL — ABNORMAL HIGH (ref 65–99)
Glucose-Capillary: 68 mg/dL (ref 65–99)
Glucose-Capillary: 72 mg/dL (ref 65–99)
Glucose-Capillary: 76 mg/dL (ref 65–99)
Glucose-Capillary: 77 mg/dL (ref 65–99)
Glucose-Capillary: 75 mg/dL (ref 65–99)

## 2015-01-13 LAB — CBC
HCT: 39.1 % (ref 39.0–52.0)
HEMOGLOBIN: 13.6 g/dL (ref 13.0–17.0)
MCH: 30.3 pg (ref 26.0–34.0)
MCHC: 34.8 g/dL (ref 30.0–36.0)
MCV: 87.1 fL (ref 78.0–100.0)
Platelets: 96 10*3/uL — ABNORMAL LOW (ref 150–400)
RBC: 4.49 MIL/uL (ref 4.22–5.81)
RDW: 14.2 % (ref 11.5–15.5)
WBC: 11.1 10*3/uL — ABNORMAL HIGH (ref 4.0–10.5)

## 2015-01-13 LAB — POCT ACTIVATED CLOTTING TIME: ACTIVATED CLOTTING TIME: 134 s

## 2015-01-13 LAB — CULTURE, BLOOD (ROUTINE X 2)
CULTURE: NO GROWTH
Culture: NO GROWTH

## 2015-01-13 LAB — APTT: aPTT: 51 seconds — ABNORMAL HIGH (ref 24–37)

## 2015-01-13 LAB — HEPARIN LEVEL (UNFRACTIONATED): Heparin Unfractionated: 0.32 IU/mL (ref 0.30–0.70)

## 2015-01-13 LAB — MAGNESIUM: Magnesium: 2.5 mg/dL — ABNORMAL HIGH (ref 1.7–2.4)

## 2015-01-13 SURGERY — LEFT HEART CATH AND CORONARY ANGIOGRAPHY
Anesthesia: LOCAL

## 2015-01-13 MED ORDER — SODIUM CHLORIDE 0.9 % IV SOLN: 250.0000 mL | INTRAVENOUS | Status: DC | PRN

## 2015-01-13 MED ORDER — SODIUM CHLORIDE 0.9 % IJ SOLN
3.0000 mL | Freq: Two times a day (BID) | INTRAMUSCULAR | Status: DC
  Administered 2015-01-14: 3 mL via INTRAVENOUS
  Administered 2015-01-14: 10 mL via INTRAVENOUS

## 2015-01-13 MED ORDER — HEPARIN (PORCINE) IN NACL 2-0.9 UNIT/ML-% IJ SOLN
INTRAMUSCULAR | Status: AC
  Filled 2015-01-13: qty 1000

## 2015-01-13 MED ORDER — DEXTROSE 50 % IV SOLN
25.0000 mL | Freq: Once | INTRAVENOUS | Status: AC
  Administered 2015-01-13: 25 mL via INTRAVENOUS

## 2015-01-13 MED ORDER — ACETAMINOPHEN 325 MG PO TABS: 650.0000 mg | ORAL_TABLET | ORAL | Status: DC | PRN

## 2015-01-13 MED ORDER — DEXTROSE 50 % IV SOLN
INTRAVENOUS | Status: AC
  Administered 2015-01-13: 25 mL via INTRAVENOUS
  Filled 2015-01-13: qty 50

## 2015-01-13 MED ORDER — SODIUM CHLORIDE 0.9 % IV SOLN: INTRAVENOUS | Status: DC

## 2015-01-13 MED ORDER — HYDROMORPHONE BOLUS VIA INFUSION
0.5000 mg | INTRAVENOUS | Status: DC | PRN
  Administered 2015-01-13 – 2015-01-14 (×5): 0.5 mg via INTRAVENOUS
  Filled 2015-01-13 (×6): qty 1

## 2015-01-13 MED ORDER — HYDROMORPHONE HCL PF 10 MG/ML IJ SOLN
0.5000 mg/h | INTRAMUSCULAR | Status: DC
  Administered 2015-01-13: 2 mg/h via INTRAVENOUS
  Administered 2015-01-14 (×2): 4 mg/h via INTRAVENOUS
  Administered 2015-01-15: 5 mg/h via INTRAVENOUS
  Administered 2015-01-15: 4 mg/h via INTRAVENOUS
  Administered 2015-01-15 – 2015-01-17 (×5): 5 mg/h via INTRAVENOUS
  Filled 2015-01-13 (×10): qty 5

## 2015-01-13 MED ORDER — SODIUM CHLORIDE 0.9 % IV SOLN
250.0000 mL | INTRAVENOUS | Status: DC | PRN
  Administered 2015-01-13: 250 mL via INTRAVENOUS

## 2015-01-13 MED ORDER — ONDANSETRON HCL 4 MG/2ML IJ SOLN: 4.0000 mg | Freq: Four times a day (QID) | INTRAMUSCULAR | Status: DC | PRN

## 2015-01-13 MED ORDER — ANTICOAGULANT SODIUM CITRATE 4% (200MG/5ML) IV SOLN
5.0000 mL | Status: AC
  Administered 2015-01-13: 5 mL via INTRAVENOUS
  Filled 2015-01-13: qty 250

## 2015-01-13 MED ORDER — PROPOFOL 1000 MG/100ML IV EMUL
5.0000 ug/kg/min | INTRAVENOUS | Status: DC
Start: 2015-01-13 — End: 2015-01-15
  Administered 2015-01-13 (×2): 20 ug/kg/min via INTRAVENOUS
  Administered 2015-01-14 (×2): 25 ug/kg/min via INTRAVENOUS
  Administered 2015-01-14: 20 ug/kg/min via INTRAVENOUS
  Administered 2015-01-15: 30 ug/kg/min via INTRAVENOUS
  Administered 2015-01-15: 20 ug/kg/min via INTRAVENOUS
  Administered 2015-01-15: 25 ug/kg/min via INTRAVENOUS
  Filled 2015-01-13 (×8): qty 100

## 2015-01-13 MED ORDER — IOHEXOL 350 MG/ML SOLN
INTRAVENOUS | Status: DC | PRN
  Administered 2015-01-13: 140 mL via INTRA_ARTERIAL

## 2015-01-13 MED ORDER — SODIUM CHLORIDE 0.9 % IJ SOLN: 3.0000 mL | Freq: Two times a day (BID) | INTRAMUSCULAR | Status: DC

## 2015-01-13 MED ORDER — ASPIRIN 81 MG PO CHEW: 81.0000 mg | CHEWABLE_TABLET | Freq: Every day | ORAL | Status: DC

## 2015-01-13 MED ORDER — SODIUM CHLORIDE 0.9 % IJ SOLN: 3.0000 mL | INTRAMUSCULAR | Status: DC | PRN

## 2015-01-13 MED ORDER — ASPIRIN 300 MG RE SUPP
300.0000 mg | Freq: Every day | RECTAL | Status: DC
  Administered 2015-01-13 – 2015-01-18 (×6): 300 mg via RECTAL
  Filled 2015-01-13 (×8): qty 1

## 2015-01-13 MED ORDER — SODIUM CHLORIDE 0.9 % IV SOLN
0.0500 mg/kg/h | INTRAVENOUS | Status: DC
  Administered 2015-01-13: 0.05 mg/kg/h via INTRAVENOUS
  Filled 2015-01-13: qty 250

## 2015-01-13 MED ORDER — SODIUM CHLORIDE 0.9 % IV SOLN
0.0550 mg/kg/h | INTRAVENOUS | Status: DC
  Filled 2015-01-13: qty 250

## 2015-01-13 MED ORDER — LIDOCAINE HCL (PF) 1 % IJ SOLN
INTRAMUSCULAR | Status: AC
  Filled 2015-01-13: qty 30

## 2015-01-13 MED ORDER — SODIUM CHLORIDE 0.9 % IJ SOLN
3.0000 mL | Freq: Two times a day (BID) | INTRAMUSCULAR | Status: DC
  Administered 2015-01-13 – 2015-01-14 (×2): 10 mL via INTRAVENOUS
  Administered 2015-01-14: 3 mL via INTRAVENOUS

## 2015-01-13 MED ORDER — PROPOFOL 1000 MG/100ML IV EMUL
INTRAVENOUS | Status: AC
Start: 2015-01-13 — End: 2015-01-13
  Filled 2015-01-13: qty 100

## 2015-01-13 MED ORDER — WHITE PETROLATUM GEL
Status: AC
  Administered 2015-01-13: 0.2
  Filled 2015-01-13: qty 1

## 2015-01-13 MED ORDER — MIDAZOLAM HCL 2 MG/2ML IJ SOLN
1.0000 mg | INTRAMUSCULAR | Status: DC | PRN
  Administered 2015-01-13: 4 mg via INTRAVENOUS
  Administered 2015-01-13 – 2015-01-15 (×5): 2 mg via INTRAVENOUS
  Filled 2015-01-13: qty 4
  Filled 2015-01-13: qty 2
  Filled 2015-01-13: qty 4
  Filled 2015-01-13 (×2): qty 2

## 2015-01-13 MED FILL — Medication: Qty: 1 | Status: AC

## 2015-01-13 SURGICAL SUPPLY — 14 items
CATH INFINITI 4FR MULTIPK 3DRC (CATHETERS) ×1 IMPLANT
CATH INFINITI 5 FR AL2 (CATHETERS) ×1 IMPLANT
CATH INFINITI 5 FR AR1 MOD (CATHETERS) ×1 IMPLANT
CATH INFINITI 5 FR MPA2 (CATHETERS) ×1 IMPLANT
CATH INFINITI 5FR AL1 (CATHETERS) ×1 IMPLANT
CATH INFINITI 5FR ANG PIGTAIL (CATHETERS) ×2 IMPLANT
ELECT DEFIB PAD ADLT CADENCE (PAD) ×1 IMPLANT
HOVERMATT SINGLE USE (MISCELLANEOUS) ×1 IMPLANT
KIT HEART LEFT (KITS) ×2 IMPLANT
PACK CARDIAC CATHETERIZATION (CUSTOM PROCEDURE TRAY) ×2 IMPLANT
SHEATH PINNACLE 4F 10CM (SHEATH) ×1 IMPLANT
SHEATH PINNACLE 5F 10CM (SHEATH) ×1 IMPLANT
TRANSDUCER W/STOPCOCK (MISCELLANEOUS) ×2 IMPLANT
WIRE EMERALD 3MM-J .035X150CM (WIRE) ×1 IMPLANT

## 2015-01-13 NOTE — Progress Notes (Signed)
PULMONARY / CRITICAL CARE MEDICINE   Name: Jared [name] MRN: [id number] DOB: [birth year]    ADMISSION DATE:  01/08/2015 CONSULTATION DATE:  8/18  REFERRING MD :  EDP Dr Criss Alvine  CHIEF COMPLAINT:  Found down  INITIAL PRESENTATION:   59 yo male had severe abdominal pain, and then found unresponsive at home.  In ER he was in shock, cyanotic, with lactic acidosis, and hyperkalemia.  STUDIES:  8/18 Echo >> EF 45 to 50%, mod RV dilation, mod/severe RV dysfx, PAS 47 mmHg 8/18 Renal u/s >> b/l renal echogenicity  SIGNIFICANT EVENTS: 8/18 Admit, CCS, cardiology consulted; brief PEA arrest; laparotomy with placement of wound vac; started heparin gtt for presumed PE 8/19 Renal consulted 8/20 CRRT  8/21 OR for abd wound closure    8/21 :  To OR this am with abd wound closure , did well . CRRT started yesterday , no change in scr or UOP.  Bradycardia overnight with Amio stopped. HR improved but remains in upper 40s. Remains on pressors with Levo and Vaso , decreased demands    01/12/15; For cath later 01/12/2015 per Dr Jomarie Longs. On pressors. Anuric. On CRRT. Off pressors. Improved Shock Liver. On fent gtt and heparing gtt. 40% fio2    SUBJECTIVE/OVERNIGHT/INTERVAL HX 01/13/15: Cath delayed to 01/13/2015. Off pressors. On CRRT. On fent gtt . Off heparin gtt -> due to dropping platelet. HITT panel sent. RN reports significant abd tenderness. CCS reports patient has ileys    VITAL SIGNS: Temp:  [97.7 F (36.5 C)-98.7 F (37.1 C)] 97.7 F (36.5 C) (08/23 0800) Pulse Rate:  [63-93] 71 (08/23 0751) Resp:  [21-26] 25 (08/23 0751) BP: (86-137)/(44-73) 114/58 mmHg (08/23 0751) SpO2:  [95 %-100 %] 100 % (08/23 0751) Arterial Line BP: (89-155)/(45-74) 122/61 mmHg (08/23 0700) FiO2 (%):  [40 %] 40 % (08/23 0751) Weight:  [133.6 kg (294 lb 8.6 oz)] 133.6 kg (294 lb 8.6 oz) (08/23 0400) HEMODYNAMICS: CVP:  [12 mmHg-20 mmHg] 20 mmHg VENTILATOR SETTINGS: Vent Mode:  [-] PRVC FiO2 (%):  [40 %] 40  % Set Rate:  [25 bmp] 25 bmp Vt Set:  [670 mL] 670 mL PEEP:  [5 cmH20] 5 cmH20 Plateau Pressure:  [21 cmH20-26 cmH20] 23 cmH20 INTAKE / OUTPUT:  Intake/Output Summary (Last 24 hours) at 01/13/15 1024 Last data filed at 01/13/15 0900  Gross per 24 hour  Intake 2246.26 ml  Output   3561 ml  Net -1314.74 ml    PHYSICAL EXAMINATION: General: critically ill appearing on vent  Neuro: sedated moves extremities. RASS -3 on fent gtt at HEENT: ETT in place Cardiovascular: SB , no murmur Lungs: faint basilar crackles Abdomen: mild distention, wound vac in place, tenderness diffusely + Musculoskeletal: no edema  LABS:  PULMONARY  Recent Labs Lab 01/09/15 0008 01/09/15 0410 01/09/15 1901 01/10/15 0408 01/10/15 1615 01/11/15 1131  PHART 7.390 7.437 7.339* 7.348*  --  7.439  PCO2ART 35.6 32.4* 45.3* 41.9  --  30.4*  PO2ART 118* 99.8 88.0 106*  --  79.8*  HCO3 21.0 21.7 24.0 22.2  --  20.3  TCO2 22.1 22.7 25 23.4  --  21.2  O2SAT 97.3 96.7 95.0 96.7 76.4 95.1    CBC  Recent Labs Lab 01/11/15 0413 01/12/15 0414 01/13/15 0509  HGB 14.5 14.0 13.6  HCT 39.3 38.6* 39.1  WBC 19.9* 14.9* 11.1*  PLT 121* 102* 96*    COAGULATION  Recent Labs Lab 01/08/15 1350 01/09/15 1630 01/11/15 0413 01/12/15 0801  INR 1.54*  2.24* 1.68* 1.34    CARDIAC    Recent Labs Lab 01/09/15 0149 01/09/15 0914 01/09/15 1512 01/09/15 2129 01/11/15 1215  TROPONINI >65.00* >65.00* >65.00* 49.60* 4.71*   No results for input(s): PROBNP in the last 168 hours.   CHEMISTRY  Recent Labs Lab 01/09/15 0148  01/10/15 0330 01/11/15 0413 01/11/15 1215 01/12/15 0414 01/12/15 1515 01/13/15 0509  NA 134*  < > 135 133* 134* 137 137 137  K 3.8  < > 3.8 3.2* 3.4* 4.0 4.3 4.1  CL 98*  < > 96* 98* 99* 103 103 102  CO2 20*  < > 21* 20* 20* 23 22 22   GLUCOSE 113*  < > 127* 143* 119* 87 83 114*  BUN 48*  < > 71* 67* 65* 59* 58* 54*  CREATININE 5.34*  < > 8.72* 8.43* 8.55* 8.03* 7.16*  6.72*  CALCIUM 7.6*  < > 6.8* 6.7* 6.7* 7.0* 7.2* 7.6*  MG 2.5*  --  2.2 2.4 2.4  --   --  2.5*  PHOS 3.7  --  4.6 3.4 4.3  --  4.2 4.6  < > = values in this interval not displayed. Estimated Creatinine Clearance: 16.9 mL/min (by C-G formula based on Cr of 6.72).   LIVER  Recent Labs Lab 01/08/15 1100 01/08/15 1350 01/09/15 1630 01/10/15 0330 01/11/15 0413 01/12/15 0414 01/12/15 0801 01/12/15 1515 01/13/15 0509  AST 626*  --  4557* 2907* 1601* 686*  --   --   --   ALT 729*  --  4100* 3366* 2981* 2171*  --   --   --   ALKPHOS 73  --  83 74 82 79  --   --   --   BILITOT 1.3*  --  1.7* 1.7* 2.5* 3.5*  --   --   --   PROT 7.0  --  4.1* 3.7* 3.9* 4.3*  --   --   --   ALBUMIN 4.1  --  2.4* 2.1* 2.0* 2.0*  --  2.0* 2.1*  INR  --  1.54* 2.24*  --  1.68*  --  1.34  --   --      INFECTIOUS  Recent Labs Lab 01/09/15 0130 01/09/15 0945 01/09/15 1630 01/10/15 0330 01/11/15 0413  LATICACIDVEN 3.5*  --  1.7 1.5  --   PROCALCITON  --  40.68  --  37.87 25.12     ENDOCRINE CBG (last 3)   Recent Labs  01/13/15 0415 01/13/15 0437 01/13/15 0753  GLUCAP 68 112* 77         IMAGING x48h  - image(s) personally visualized  -   highlighted in bold Dg Chest Port 1 View  01/13/2015   CLINICAL DATA:  Intubation .  EXAM: PORTABLE CHEST - 1 VIEW  COMPARISON:  01/12/2015.  FINDINGS: Endotracheal tube, bilateral IJ lines, NG tube in stable position. Cardiomegaly with pulmonary vascular prominence and bilateral interstitial prominence with pleural effusions noted. Findings consistent congestive heart failure. Mild interim improvement. No pneumothorax . Prior cervical spine fusion.  IMPRESSION: 1. Lines and tubes in stable position. 2. Congestive heart failure with pulmonary interstitial edema and bilateral pleural effusions. Mild interim improvement from prior exam.   Electronically Signed   By: Maisie Fus  Register   On: 01/13/2015 07:32   Dg Chest Port 1 View  01/12/2015   CLINICAL DATA:   Respiratory failure.  EXAM: PORTABLE CHEST - 1 VIEW  COMPARISON:  01/11/2015.  FINDINGS: Endotracheal tube, NG tube, bilateral IJ  lines in stable position. Cardiomegaly with pulmonary vascular prominence and bilateral pulmonary alveolar infiltrates consistent with bilateral pulmonary edema. Bilateral pleural effusions are noted. No pneumothorax. Prior cervical spine fusion.  IMPRESSION: 1. Lines and tubes in stable position. 2. Cardiomegaly with bilateral pulmonary alveolar infiltrates and pleural effusions consistent with congestive heart failure. Bilateral pneumonia cannot be excluded . No and improvement from prior exam.   Electronically Signed   By: Maisie Fus  Register   On: 01/12/2015 07:13         ASSESSMENT / PLAN:  PULMONARY ETT 8/18 >>> A: Acute respiratory failure 2nd to presumed PE, lactic acidosis. Hx of OSA    - does not meet sbt criteria 01/13/2015.  P:   Full vent support F/u CXR, ABG Adjust PEEP/FiO2 to keep SpO2 > 92% VAP precautions     CARDIOVASCULAR Rt femoral CVL 8/18 >>8/20 Lt femoral A line 8/18 >> RIJ HD 8/20 >> LIJ 8/20 >>  A: Shock >> likely cardiogenic. RV failure with possible PE.-although venous doppler neg for DVT ? MI related  Hx of HTN, HLD. Atrial fib  Probable Acute MI (w/ ST elevation /elevation troponin )     - Off amio ? Date. Off pressors since 01/12/2015 AM. On amio gtt but off heparin gtt due to thrombocytopenia. For cath 01/13/2015   P:  Cath 01/13/2015 Hold Hep drip  Check duplex LE rule uot DVT Await HITT panel  Defer CT chest with contrast due to concern for renal dysfx./When stable could consider VQ scan  Hold outpt lipitor, bisoprolol, HCTZ, losartan, minoxidil  RENAL AKI >> baseline creatinine 1.14 from 12/17/14., renal US neg >renal function is worse with decreased UOP >CRRT started per renal 8/20  Hyperkalemia >> resolved  Hyponatremia. Metabolic acidosis.>improved  Hx of BPH. Hypokalemia    - On CRRT. Wife aware  renal prognosis is worse after Cath but accepts risk  V benefit ratio  P:   CRRT per Renal since 8/20  [facility]  IVF   GASTROINTESTINAL A: Acute abdominal pain s/p laparotomy with wound vac 8/18.>wound vac change 8/19 d/t eviceration>to OR 8/21 with wound closure  Shock Liver >LFT tr down   - TF still not started. CCS holding off due to ileus. ABd tenderness +  P:   AXR stat 01/13/2015 Post-op care per surgery  Protonix for SUP NGT to LIS per CCS Wound VAC with changes tTS TF start decision per CCS   HEMATOLOGIC A: Coagulopathy with elevated INR probably secnodary to shock liver >improving with INR tr down  P:  F/u CBC and INR   INFECTIOUS A: Sepsis with presumed abdominal source.>exp lap was neg .  Temp and WBC tr up  >Now w/ wound vac will need to cont abx for now . Follow Cx and PCT ,  P:   Day 4  vancomycin, zosyn  Blood 8/18 >> Urine 8/18 >>NEG   ENDOCRINE A: Hyperglycemia  P:   SSI   NEUROLOGIC A: Acute metabolic encephalopathy >> improved 8/18.>follows commands off sedation  Hx of chronic back pain, anxiety, depression.   - on sedation gtt but has sginificant abdominal pain  P:   RASS goal: -1  Wean sedation as able  Change fentanyl gtt to dilaudid gtt Use versed prn Hold outpt xanax, celexa, oxycodone, lyrica   FAMILY Updates - wife updated at bedside 01/12/2015 with presence of RN Darel Hong  IDT meet: due on LOS 7 days which is 01/16/15      The patient is critically  ill with multiple organ systems failure and requires high complexity decision making for assessment and support, frequent evaluation and titration of therapies, application of advanced monitoring technologies and extensive interpretation of multiple databases.   Critical Care Time devoted to patient care services described in this note is  35  Minutes. This time reflects time of care of this signee Dr Kalman Shan. This critical care time does not reflect procedure time, or  teaching time or supervisory time of PA/NP/Med student/Med Resident etc but could involve care discussion time    Dr. Kalman Shan, M.D., [facility].C.P Pulmonary and Critical Care Medicine Staff Physician [facility] [place] Pulmonary and Critical Care Pager: [phone], If no answer or between  15:00h - 7:00h: call [phone]  01/13/2015 10:24 AM

## 2015-01-13 NOTE — Progress Notes (Signed)
ANTIBIOTIC CONSULT NOTE - Follow Up Pharmacy Consult for vancomycin and Zosyn Indication: Sepsis due to abdominal source  Allergies  Allergen Reactions  . Morphine And Related Anaphylaxis and Shortness Of Breath  . Montelukast Other (See Comments)    unknown  . Neurontin [Gabapentin] Other (See Comments)    delusional  . Wellbutrin [Bupropion] Palpitations    Insomnia    Labs:  Recent Labs  01/11/15 0413  01/12/15 0414 01/12/15 1515 01/13/15 0509  WBC 19.9*  --  14.9*  --  11.1*  HGB 14.5  --  14.0  --  13.6  PLT 121*  --  102*  --  96*  CREATININE 8.43*  < > 8.03* 7.16* 6.72*  < > = values in this interval not displayed. Estimated Creatinine Clearance: 16.9 mL/min (by C-G formula based on Cr of 6.72). No results for input(s): VANCOTROUGH, VANCOPEAK, VANCORANDOM, GENTTROUGH, GENTPEAK, GENTRANDOM, TOBRATROUGH, TOBRAPEAK, TOBRARND, AMIKACINPEAK, AMIKACINTROU, AMIKACIN in the last 72 hours.   Microbiology: Recent Results (from the past 720 hour(s))  Blood Culture (routine x 2)     Status: None (Preliminary result)   Collection Time: 01/08/15 11:31 AM  Result Value Ref Range Status   Specimen Description BLOOD A-LINE  Final   Special Requests BOTTLES DRAWN AEROBIC AND ANAEROBIC 10CC  Final   Culture NO GROWTH 4 DAYS  Final   Report Status PENDING  Incomplete  Blood Culture (routine x 2)     Status: None (Preliminary result)   Collection Time: 01/08/15 12:11 PM  Result Value Ref Range Status   Specimen Description BLOOD LEFT ANTECUBITAL  Final   Special Requests BOTTLES DRAWN AEROBIC AND ANAEROBIC 5CC  Final   Culture NO GROWTH 4 DAYS  Final   Report Status PENDING  Incomplete  MRSA PCR Screening     Status: None   Collection Time: 01/08/15  3:30 PM  Result Value Ref Range Status   MRSA by PCR NEGATIVE NEGATIVE Final    Comment:        The GeneXpert MRSA Assay (FDA approved for NASAL specimens only), is one component of a comprehensive MRSA colonization surveillance  program. It is not intended to diagnose MRSA infection nor to guide or monitor treatment for MRSA infections.   Urine culture     Status: None   Collection Time: 01/08/15  4:39 PM  Result Value Ref Range Status   Specimen Description URINE, CATHETERIZED  Final   Special Requests NONE  Final   Culture NO GROWTH 2 DAYS  Final   Report Status 01/10/2015 FINAL  Final    Assessment: 68 YOM with history of HTN, HLD, pre-diabetes, chronic pain and depression- [facility] was called for unresponsive agonal breathing- patient improved in the field with CPAP and narcan. Suspected sepsis s/p exploratory lap with application of wound VAC.  WBC trending down 11.1, LA improved 9.3>1.5, Afebrile, PCT up to 41>25.12   8/18 BCx > NGx4D  8/18 Urine > NG  8/18 MRSA PCR > Neg  Zosyn 8/18 > Vancomycin 8/18 >  His renal function continued to deteriorate and he is now on CVVHD.  Goal of Therapy:  Appropriate dosing with CVVHD  Plan:  Continue Zosyn 2.25 g IV Q 6 hours Vancomycin 1250 mg iv Q 24 hours (CVVHD dosing) Follow up CVVHD plans, cultures, progress, fever trend and labs  Arcola Jansky, PharmD Clinical Pharmacy Resident Pager: [phone] 01/13/2015 1:15 PM

## 2015-01-13 NOTE — Progress Notes (Signed)
Subjective: Interval History: off pressor, didn't go to cath lab yesterday, going today.  Objective: Vital signs in last 24 hours: Temp:  [97.7 F (36.5 C)-98.7 F (37.1 C)] 98.1 F (36.7 C) (08/23 0412) Pulse Rate:  [63-93] 71 (08/23 0751) Resp:  [15-26] 25 (08/23 0751) BP: (86-137)/(44-73) 114/58 mmHg (08/23 0751) SpO2:  [95 %-100 %] 100 % (08/23 0751) Arterial Line BP: (87-155)/(44-74) 122/61 mmHg (08/23 0700) FiO2 (%):  [40 %] 40 % (08/23 0751) Weight:  [294 lb 8.6 oz (133.6 kg)] 294 lb 8.6 oz (133.6 kg) (08/23 0400) Weight change: -7 lb 11.5 oz (-3.5 kg)  Intake/Output from previous day: 08/22 0701 - 08/23 0700 In: 2348.8 [I.V.:1693.8; NG/GT:180; IV Piggyback:450] Out: 3636 [Urine:58; Emesis/NG output:500; Drains:100] Intake/Output this shift:    General: intubated and sedated. Heent: [place]/at, perrl Card: RRR, no m/r/g Lungs: coarse breathe sounds Abd: distended, midline wound covered with wound vac.  Ext: no edema. Well perfused ext's.   Lab Results:  Recent Labs  01/12/15 0414 01/13/15 0509  WBC 14.9* 11.1*  HGB 14.0 13.6  HCT 38.6* 39.1  PLT 102* 96*   BMET:   Recent Labs  01/12/15 1515 01/13/15 0509  NA 137 137  K 4.3 4.1  CL 103 102  CO2 22 22  GLUCOSE 83 114*  BUN 58* 54*  CREATININE 7.16* 6.72*  CALCIUM 7.2* 7.6*   No results for input(s): PTH in the last 72 hours. Iron Studies: No results for input(s): IRON, TIBC, TRANSFERRIN, FERRITIN in the last 72 hours. Studies/Results: Dg Chest Port 1 View  01/13/2015   CLINICAL DATA:  Intubation .  EXAM: PORTABLE CHEST - 1 VIEW  COMPARISON:  01/12/2015.  FINDINGS: Endotracheal tube, bilateral IJ lines, NG tube in stable position. Cardiomegaly with pulmonary vascular prominence and bilateral interstitial prominence with pleural effusions noted. Findings consistent congestive heart failure. Mild interim improvement. No pneumothorax . Prior cervical spine fusion.  IMPRESSION: 1. Lines and tubes in stable  position. 2. Congestive heart failure with pulmonary interstitial edema and bilateral pleural effusions. Mild interim improvement from prior exam.   Electronically Signed   By: Maisie Fus  Register   On: 01/13/2015 07:32   Dg Chest Port 1 View  01/12/2015   CLINICAL DATA:  Respiratory failure.  EXAM: PORTABLE CHEST - 1 VIEW  COMPARISON:  01/11/2015.  FINDINGS: Endotracheal tube, NG tube, bilateral IJ lines in stable position. Cardiomegaly with pulmonary vascular prominence and bilateral pulmonary alveolar infiltrates consistent with bilateral pulmonary edema. Bilateral pleural effusions are noted. No pneumothorax. Prior cervical spine fusion.  IMPRESSION: 1. Lines and tubes in stable position. 2. Cardiomegaly with bilateral pulmonary alveolar infiltrates and pleural effusions consistent with congestive heart failure. Bilateral pneumonia cannot be excluded . No and improvement from prior exam.   Electronically Signed   By: Maisie Fus  Register   On: 01/12/2015 07:13    I have reviewed the patient's current medications.  Assessment/Plan:  59 yo male with hx of HTN, HLD, here after being found unresponsive, found to be in Shock likely 2/2 PE, also has sepsis.   1. Shock - likely obstructive shock from possible PE (RA and RV dilated, RV hypocontractile on echo), but could also be from possible sepsis (PCT high, tmax 101.1, on abx) - on heparin drip, cardiology on board. CT angio deferred until renal improvement. Off pressors now. 2. Oliguric AKI on CKD 2 - likely 2/2 to shock. Renal u/s shows chronic medial renal disease.  Failed trial of lasix. On CRRT since 01/10/15.  3. Hypoxic resp failure - likely 2/2 to PE, also does have pulm edema and pulm venous congestion. Remains on vent.  4. shocked Liver - AST/ALT slowly coming down.  5. s/p exp lap and also fascia closure. Has wound vac.  6. Gap metabolic acidosis - likely 2/2 to lactic acidosis + renal failure - in the setting of shock improving.  7. Elevated  Trop -  Was >60, now down trending- no ST changes on EKG. However, given RV dysfunction, going for cath lab today. Cards on board.  8. Bradycardia - improved, likely was 2/2 to post-anesthesia.  given trop elevation, cath results pending.  9. Hypokalemia - repleted.  Plan:  1. Continue CRRT. F/up cath results. 2. Rest per primary team.     LOS: 5 days   Ahmed, Tasrif 01/13/2015,8:01 AM  I have seen and examined this patient and agree with plan as outlined by Dr. Tasia Catchings.  Mr. Duffus is due for cardiac cath cath and will hold CVVHD until after cath and will resume previous orders. Latif Nazareno A,MD 01/13/2015 10:33 AM

## 2015-01-13 NOTE — Progress Notes (Signed)
eLink Physician-Brief Progress Note Patient Name: ADLER LUDOVICO DOB: December 11, 1955 MRN: [id number]   Date of Service  01/13/2015  HPI/Events of Note  Severe agitn on camera Precedex gtt -caused brady 24h ago Now off pressors  eICU Interventions  Use propofol gt , aim for RASS -1     Intervention Category Major Interventions: Delirium, psychosis, severe agitation - evaluation and management  Ardean Simonich 01/13/2015, 6:09 PM

## 2015-01-13 NOTE — Interval H&P Note (Signed)
History and Physical Interval Note:  01/13/2015 11:20 AM  Jared [name]  has presented today for surgery, with the diagnosis of unstable angina  The various methods of treatment have been discussed with the patient and family. After consideration of risks, benefits and other options for treatment, the patient has consented to  Procedure(s): Left Heart Cath and Coronary Angiography (N/A) as a surgical intervention .  The patient's history has been reviewed, patient examined, no change in status, stable for surgery.  I have reviewed the patient's chart and labs.  Questions were answered to the patient's satisfaction.    Planning diagnostic cath only with minimal contrast usage.  Patient with NSTEMI.   Moni Rothrock S.

## 2015-01-13 NOTE — Progress Notes (Signed)
Hypoglycemic Event  CBG: cbg-68  Treatment: D50 IV 25 mL  Symptoms: None  Follow-up CBG: Time:0435 CBG Result:112  Possible Reasons for Event: Inadequate meal intake  Comments/MD notified:Dr. Gardiner Coins, [place]  Remember to initiate Hypoglycemia Order Set & complete

## 2015-01-13 NOTE — Interval H&P Note (Signed)
History and Physical Interval Note:  01/13/2015 11:27 AM  Jared [name]  has presented today for surgery, with the diagnosis of unstable angina  The various methods of treatment have been discussed with the patient and family. After consideration of risks, benefits and other options for treatment, the patient has consented to  Procedure(s): Left Heart Cath and Coronary Angiography (N/A) as a surgical intervention .  The patient's history has been reviewed, patient examined, no change in status, stable for surgery.  I have reviewed the patient's chart and labs.  Questions were answered to the patient's satisfaction.    Patient to have diagnostic cardiac cath.  I explained this to the patient's wife yesterday.  All questions were answered about the procedure.  The patient understands that risks include but are not limited to stroke (1 in 1000), death (1 in 1000),, bleeding (1 in 200), allergic reaction [possibly serious] (1 in 200), and agrees to proceed.   She understands that this could further worsen his renal function.  Will try to minimize contrast dye.   Miley Lindon S.

## 2015-01-13 NOTE — Progress Notes (Signed)
*  Preliminary Results* Bilateral lower extremity venous duplex completed. Bilateral lower extremities are negative for deep vein thrombosis. There is no evidence of Baker's cyst bilaterally.   No change when compared to prior study 01/09/2015.  01/13/2015  Gertie Fey, RVT, RDCS, RDMS

## 2015-01-13 NOTE — Progress Notes (Signed)
Patient Name: Jared [name] Date of Encounter: 01/13/2015  Active Problems:   Septic shock   Acute respiratory failure with hypoxia   Shock   Acute respiratory failure with hypoxemia   Cardiogenic shock   AKI (acute kidney injury)   Renal failure   Length of Stay: 5  SUBJECTIVE  Remains intubated, sedated, off pressors. Anuric. On CRRT. Cath rescheduled for today. Note 6 lb weight gain in last 3 days, commensurate with positive fluid balance, mostly from 3-4 days ago  CURRENT MEDS . antiseptic oral rinse  7 mL Mouth Rinse QID  . aspirin  81 mg Oral Daily  . chlorhexidine gluconate  15 mL Mouth Rinse BID  . insulin aspart  0-9 Units Subcutaneous 6 times per day  . pantoprazole (PROTONIX) IV  40 mg Intravenous QHS  . piperacillin-tazobactam (ZOSYN)  IV  2.25 g Intravenous 4 times per day  . sodium chloride  3 mL Intravenous Q12H  . vancomycin  1,250 mg Intravenous Q24H    OBJECTIVE   Intake/Output Summary (Last 24 hours) at 01/13/15 0833 Last data filed at 01/13/15 0800  Gross per 24 hour  Intake 2246.26 ml  Output   3657 ml  Net -1410.74 ml   Filed Weights   01/11/15 0415 01/12/15 0430 01/13/15 0400  Weight: 300 lb 14.9 oz (136.5 kg) 302 lb 4 oz (137.1 kg) 294 lb 8.6 oz (133.6 kg)    PHYSICAL EXAM Filed Vitals:   01/13/15 0600 01/13/15 0700 01/13/15 0751 01/13/15 0800  BP: 100/60 114/58 114/58   Pulse: 69  71   Temp:    97.7 F (36.5 C)  TempSrc:    Oral  Resp: 25 25 25   Weight:      SpO2: 95%  100%    General: sedated, calm. Intubated Head: no evidence of trauma, PERRL, EOMI, no exophtalmos or lid lag, no myxedema, no xanthelasma; normal ears, nose and oropharynx Neck: normal jugular venous pulsations and no hepatojugular reflux; brisk carotid pulses without delay and no carotid bruits Chest: clear to auscultation, no signs of consolidation by percussion or palpation, normal fremitus, symmetrical and full respiratory excursions Cardiovascular: normal  position and quality of the apical impulse, regular rhythm, normal first and second heart sounds, no rubs or gallops, no murmur Abdomen: no tenderness or distention, no masses by palpation, no abnormal pulsatility or arterial bruits, normal bowel sounds, no hepatosplenomegaly Extremities: no clubbing, cyanosis or edema; 2+ radial, ulnar and brachial pulses bilaterally; 2+ right femoral, posterior tibial and dorsalis pedis pulses; 2+ left femoral, posterior tibial and dorsalis pedis pulses; no subclavian or femoral bruits Neurological: unable to perform, moves all 4 extremities randomly  LABS  CBC  Recent Labs  01/12/15 0414 01/13/15 0509  WBC 14.9* 11.1*  HGB 14.0 13.6  HCT 38.6* 39.1  MCV 85.2 87.1  PLT 102* 96*   Basic Metabolic Panel  Recent Labs  01/11/15 1215  01/12/15 1515 01/13/15 0509  NA 134*  < > 137 137  K 3.4*  < > 4.3 4.1  CL 99*  < > 103 102  CO2 20*  < > 22 22  GLUCOSE 119*  < > 83 114*  BUN 65*  < > 58* 54*  CREATININE 8.55*  < > 7.16* 6.72*  CALCIUM 6.7*  < > 7.2* 7.6*  MG 2.4  --   --  2.5*  PHOS 4.3  --  4.2 4.6  < > = values in this interval not displayed. Liver Function Tests    Recent Labs  01/11/15 0413 01/12/15 0414 01/12/15 1515 01/13/15 0509  AST 1601* 686*  --   --   ALT 2981* 2171*  --   --   ALKPHOS 82 79  --   --   BILITOT 2.5* 3.5*  --   --   PROT 3.9* 4.3*  --   --   ALBUMIN 2.0* 2.0* 2.0* 2.1*    Recent Labs  01/10/15 2342 01/12/15 0414  LIPASE 43 80*  AMYLASE 198* 218*   Cardiac Enzymes  Recent Labs  01/11/15 1215  TROPONINI 4.71*   Radiology Studies Imaging results have been reviewed and Dg Chest Port 1 View  01/13/2015   CLINICAL DATA:  Intubation .  EXAM: PORTABLE CHEST - 1 VIEW  COMPARISON:  01/12/2015.  FINDINGS: Endotracheal tube, bilateral IJ lines, NG tube in stable position. Cardiomegaly with pulmonary vascular prominence and bilateral interstitial prominence with pleural effusions noted. Findings consistent  congestive heart failure. Mild interim improvement. No pneumothorax . Prior cervical spine fusion.  IMPRESSION: 1. Lines and tubes in stable position. 2. Congestive heart failure with pulmonary interstitial edema and bilateral pleural effusions. Mild interim improvement from prior exam.   Electronically Signed   By: Maisie Fus  Register   On: 01/13/2015 07:32   Dg Chest Port 1 View  01/12/2015   CLINICAL DATA:  Respiratory failure.  EXAM: PORTABLE CHEST - 1 VIEW  COMPARISON:  01/11/2015.  FINDINGS: Endotracheal tube, NG tube, bilateral IJ lines in stable position. Cardiomegaly with pulmonary vascular prominence and bilateral pulmonary alveolar infiltrates consistent with bilateral pulmonary edema. Bilateral pleural effusions are noted. No pneumothorax. Prior cervical spine fusion.  IMPRESSION: 1. Lines and tubes in stable position. 2. Cardiomegaly with bilateral pulmonary alveolar infiltrates and pleural effusions consistent with congestive heart failure. Bilateral pneumonia cannot be excluded . No and improvement from prior exam.   Electronically Signed   By: Maisie Fus  Register   On: 01/12/2015 07:13    TELE NSR    ASSESSMENT AND PLAN  (Cardiogenic?) shock - improved, off pressors. For cardiac cath today. Note inferior hypokinesis and severe RV dysfunction. Consider RCA occlusion, but ECG remarkably silent. NSTEMI with severe troponin elevation after resuscitation (global ischemia versus RCA infarction) Bradycardia, resolved. Might also be RCA disease related, but possibly due to amiodarone given for VT/VF Acute renal failure on CRRT. Still essentially anuric S/P cardiorespiratory arrest - seems that a pulmonary event triggered this. Initially improved with narcan and BiPAP, developed ventricular arrhythmia after pressors were initiated for hypotension. May have subsequently had acute MI Thrombocytopenia  Slow decay in platelet count may be due to consumption in CRRT circuit, but raises concern for HIT.  Check panel.   [name] Fair, MD, [facility] [phone] office [phone] pager 01/13/2015 8:33 AM

## 2015-01-13 NOTE — Progress Notes (Signed)
Patient ID: Jared [name], male   DOB: [birth year], 59 y.o.   MRN: [id number] 2 Days Post-Op  Subjective: Didn't go for cath yesterday.  Plan is for today.  Still intubated  Objective: Vital signs in last 24 hours: Temp:  [97.7 F (36.5 C)-98.7 F (37.1 C)] 97.7 F (36.5 C) (08/23 0800) Pulse Rate:  [63-93] 71 (08/23 0751) Resp:  [15-26] 25 (08/23 0751) BP: (86-137)/(44-73) 114/58 mmHg (08/23 0751) SpO2:  [95 %-100 %] 100 % (08/23 0751) Arterial Line BP: (87-155)/(44-74) 122/61 mmHg (08/23 0700) FiO2 (%):  [40 %] 40 % (08/23 0751) Weight:  [133.6 kg (294 lb 8.6 oz)] 133.6 kg (294 lb 8.6 oz) (08/23 0400)    Intake/Output from previous day: 08/22 0701 - 08/23 0700 In: 2348.8 [I.V.:1693.8; NG/GT:180; IV Piggyback:450] Out: 3636 [Urine:58; Emesis/NG output:500; Drains:100] Intake/Output this shift: Total I/O In: -  Out: 150 [Other:150]  PE: Abd: soft, seems quite tender as he localizes with just placement of stethoscope on his abdomen, absent BS, still with bilious NGT output.  VAC in place but with some blood pooling underneath a little bit.  Lab Results:   Recent Labs  01/12/15 0414 01/13/15 0509  WBC 14.9* 11.1*  HGB 14.0 13.6  HCT 38.6* 39.1  PLT 102* 96*   BMET  Recent Labs  01/12/15 1515 01/13/15 0509  NA 137 137  K 4.3 4.1  CL 103 102  CO2 22 22  GLUCOSE 83 114*  BUN 58* 54*  CREATININE 7.16* 6.72*  CALCIUM 7.2* 7.6*   PT/INR  Recent Labs  01/11/15 0413 01/12/15 0801  LABPROT 19.8* 16.7*  INR 1.68* 1.34   CMP     Component Value Date/Time   NA 137 01/13/2015 0509   K 4.1 01/13/2015 0509   CL 102 01/13/2015 0509   CO2 22 01/13/2015 0509   GLUCOSE 114* 01/13/2015 0509   BUN 54* 01/13/2015 0509   CREATININE 6.72* 01/13/2015 0509   CREATININE 1.15 12/17/2014 1707   CALCIUM 7.6* 01/13/2015 0509   PROT 4.3* 01/12/2015 0414   ALBUMIN 2.1* 01/13/2015 0509   AST 686* 01/12/2015 0414   ALT 2171* 01/12/2015 0414   ALKPHOS 79 01/12/2015 0414   BILITOT 3.5* 01/12/2015 0414   GFRNONAA 8* 01/13/2015 0509   GFRNONAA 68 12/17/2014 1707   GFRAA 9* 01/13/2015 0509   GFRAA 79 12/17/2014 1707   Lipase     Component Value Date/Time   LIPASE 80* 01/12/2015 0414       Studies/Results: Dg Chest Port 1 View  01/13/2015   CLINICAL DATA:  Intubation .  EXAM: PORTABLE CHEST - 1 VIEW  COMPARISON:  01/12/2015.  FINDINGS: Endotracheal tube, bilateral IJ lines, NG tube in stable position. Cardiomegaly with pulmonary vascular prominence and bilateral interstitial prominence with pleural effusions noted. Findings consistent congestive heart failure. Mild interim improvement. No pneumothorax . Prior cervical spine fusion.  IMPRESSION: 1. Lines and tubes in stable position. 2. Congestive heart failure with pulmonary interstitial edema and bilateral pleural effusions. Mild interim improvement from prior exam.   Electronically Signed   By: Maisie Fus  Register   On: 01/13/2015 07:32   Dg Chest Port 1 View  01/12/2015   CLINICAL DATA:  Respiratory failure.  EXAM: PORTABLE CHEST - 1 VIEW  COMPARISON:  01/11/2015.  FINDINGS: Endotracheal tube, NG tube, bilateral IJ lines in stable position. Cardiomegaly with pulmonary vascular prominence and bilateral pulmonary alveolar infiltrates consistent with bilateral pulmonary edema. Bilateral pleural effusions are noted. No pneumothorax. Prior cervical spine fusion.  IMPRESSION: 1. Lines and tubes in stable position. 2. Cardiomegaly with bilateral pulmonary alveolar infiltrates and pleural effusions consistent with congestive heart failure. Bilateral pneumonia cannot be excluded . No and improvement from prior exam.   Electronically Signed   By: Maisie Fus  Register   On: 01/12/2015 07:13    Anti-infectives: Anti-infectives    Start     Dose/Rate Route Frequency Ordered Stop   01/11/15 1630  vancomycin (VANCOCIN) 1,250 mg in sodium chloride 0.9 % 250 mL IVPB     1,250 mg 166.7 mL/hr over 90 Minutes Intravenous Every 24  hours 01/11/15 1029     01/10/15 1300  vancomycin (VANCOCIN) 1,750 mg in sodium chloride 0.9 % 500 mL IVPB  Status:  Discontinued     1,750 mg 250 mL/hr over 120 Minutes Intravenous Every 48 hours 01/08/15 1327 01/09/15 1537   01/10/15 1300  vancomycin (VANCOCIN) 1,250 mg in sodium chloride 0.9 % 250 mL IVPB  Status:  Discontinued     1,250 mg 166.7 mL/hr over 90 Minutes Intravenous Every 24 hours 01/10/15 0947 01/11/15 1029   01/10/15 1200  piperacillin-tazobactam (ZOSYN) IVPB 2.25 g     2.25 g 100 mL/hr over 30 Minutes Intravenous 4 times per day 01/10/15 0929     01/09/15 1400  piperacillin-tazobactam (ZOSYN) IVPB 2.25 g  Status:  Discontinued     2.25 g 100 mL/hr over 30 Minutes Intravenous 3 times per day 01/09/15 0908 01/10/15 0929   01/09/15 1100  cefTRIAXone (ROCEPHIN) 1 g in dextrose 5 % 50 mL IVPB  Status:  Discontinued     1 g 100 mL/hr over 30 Minutes Intravenous Every 24 hours 01/08/15 1133 01/08/15 1327   01/09/15 1100  azithromycin (ZITHROMAX) 500 mg in dextrose 5 % 250 mL IVPB  Status:  Discontinued     500 mg 250 mL/hr over 60 Minutes Intravenous Every 24 hours 01/08/15 1133 01/08/15 1327   01/08/15 2200  piperacillin-tazobactam (ZOSYN) IVPB 3.375 g  Status:  Discontinued     3.375 g 12.5 mL/hr over 240 Minutes Intravenous 3 times per day 01/08/15 1327 01/09/15 0908   01/08/15 1400  vancomycin (VANCOCIN) 2,500 mg in sodium chloride 0.9 % 500 mL IVPB     2,500 mg 250 mL/hr over 120 Minutes Intravenous  Once 01/08/15 1327 01/08/15 1747   01/08/15 1330  piperacillin-tazobactam (ZOSYN) IVPB 3.375 g  Status:  Discontinued     3.375 g 100 mL/hr over 30 Minutes Intravenous  Once 01/08/15 1316 01/10/15 0929   01/08/15 1130  cefTRIAXone (ROCEPHIN) 1 g in dextrose 5 % 50 mL IVPB     1 g 100 mL/hr over 30 Minutes Intravenous  Once 01/08/15 1126 01/08/15 1220   01/08/15 1130  azithromycin (ZITHROMAX) 500 mg in dextrose 5 % 250 mL IVPB     500 mg 250 mL/hr over 60 Minutes  Intravenous  Once 01/08/15 1126 01/08/15 1322       Assessment/Plan  POD 5, 2 s/p ex lap with open abdominal VAC placement and then closure -patient has an ileus. Would cont NGT to LIWS for now. When his function returns, he can be started on TFs if still intubated. -skin VAC changes TTS.  Will evaluate when this is changed today MMP -per primary service and other specialties -cardiac cath today as it was cancelled yesterday DVT prophylaxis -SCDs/heparin drip     ADDENDUM: 10:00am, VAC removed.  Patient had bleeding under his VAC.  He has a vessel that was acutely bleeding that didn't  respond to direct pressure.  A 3-0 nylon was used to put a figure of 8 suture in the vessel.  This responded to stitch cauterization.  The rest of the wound just has some general raw surface ooze.  Will DC VAC and just do NS WD dressing changes to midline wound BID, starting tomorrow.  LOS: 5 days    Shanigua Gibb E 01/13/2015, 8:14 AM Pager: [phone]

## 2015-01-13 NOTE — Progress Notes (Addendum)
ANTICOAGULATION CONSULT NOTE - Initial Consult  Pharmacy Consult for Bivalirudin Indication: HIT/Concern for PE  Allergies  Allergen Reactions  . Morphine And Related Anaphylaxis and Shortness Of Breath  . Montelukast Other (See Comments)    unknown  . Neurontin [Gabapentin] Other (See Comments)    delusional  . Wellbutrin [Bupropion] Palpitations    Insomnia    Patient Measurements: Weight: 294 lb 8.6 oz (133.6 kg)  Vital Signs: Temp: 99.2 F (37.3 C) (08/23 1030) Temp Source: Oral (08/23 1030) BP: 116/58 mmHg (08/23 1400) Pulse Rate: 68 (08/23 1400)  Labs:  Recent Labs  01/11/15 0413 01/11/15 1215 01/12/15 0414 01/12/15 0801 01/12/15 1515 01/12/15 1712 01/13/15 0509  HGB 14.5  --  14.0  --   --   --  13.6  HCT 39.3  --  38.6*  --   --   --  39.1  PLT 121*  --  102*  --   --   --  96*  LABPROT 19.8*  --   --  16.7*  --   --   --   INR 1.68*  --   --  1.34  --   --   --   HEPARINUNFRC  --   --   --  0.33  --  0.39 0.32  CREATININE 8.43* 8.55* 8.03*  --  7.16*  --  6.72*  TROPONINI  --  4.71*  --   --   --   --   --     Estimated Creatinine Clearance: 16.9 mL/min (by C-G formula based on Cr of 6.72).   Medical History: Past Medical History  Diagnosis Date  . Chest pain   . HTN (hypertension)   . Hyperlipemia   . DJD (degenerative joint disease)   . BPH (benign prostatic hypertrophy)   . Obesity   . OSA (obstructive sleep apnea)   . Hypercholesteremia   . Chronic back pain   . Anxiety   . Depression     Medications:  Prescriptions prior to admission  Medication Sig Dispense Refill Last Dose  . albuterol (PROVENTIL HFA;VENTOLIN HFA) 108 (90 BASE) MCG/ACT inhaler Inhale 2 puffs into the lungs every 2 (two) hours as needed for wheezing or shortness of breath (cough). 1 Inhaler 2 2 weeks  . ALPRAZolam (XANAX) 1 MG tablet TAKE 1/2-1 TABLET BY MOUTH 3 TIMES DAILY 90 tablet 1 01/07/2015 at Unknown time  . atorvastatin (LIPITOR) 40 MG tablet TAKE 1 TABLET  BY MOUTH DAILY OR AS DIRECTED FOR CHOLESTEROL (Patient taking differently: TAKE 1 TABLET BY MOUTH every evening OR AS DIRECTED FOR CHOLESTEROL) 90 tablet 3 01/07/2015 at Unknown time  . bisoprolol-hydrochlorothiazide (ZIAC) 10-6.25 MG per tablet Take 1 tablet by mouth daily.   01/07/2015 at Unknown time  . Cholecalciferol (VITAMIN D-3) 5000 UNITS TABS Take 2,000 Units by mouth daily.    01/07/2015 at Unknown time  . citalopram (CELEXA) 40 MG tablet TAKE 1 TABLET BY MOUTH EVERY DAY FOR MOOD 90 tablet 0 01/07/2015 at Unknown time  . docusate sodium (COLACE) 100 MG capsule Take 100 mg by mouth daily as needed for mild constipation.    01/05/2015 at Unknown time  . Flaxseed, Linseed, (FLAXSEED OIL PO) Take 1 tablet by mouth daily.     01/07/2015 at Unknown time  . fluticasone (FLONASE) 50 MCG/ACT nasal spray USE 1 TO 2 SPRAYS IN EACHNOSTRIL ONCE DAILY (Patient taking differently: USE 1 TO 2 SPRAYS IN EACHNOSTRIL ONCE DAILY as needed) 16 g 99 Past  Month at Unknown time  . losartan-hydrochlorothiazide (HYZAAR) 100-25 MG per tablet TAKE 1 TABLET BY MOUTH EVERY DAY 90 tablet 0 01/07/2015 at Unknown time  . minoxidil (LONITEN) 10 MG tablet Take 1 tablet daily or as directed for BP (Patient taking differently: Take 5 mg by mouth at bedtime. ) 90 tablet 99 01/07/2015 at Unknown time  . Omega-3 Fatty Acids (FISH OIL BURP-LESS PO) Take 1 tablet by mouth daily.     01/07/2015 at Unknown time  . oxycodone (ROXICODONE) 30 MG immediate release tablet Take 60 mg by mouth every 4 (four) hours as needed for pain.    01/07/2015 at Unknown time  . pregabalin (LYRICA) 75 MG capsule Take 1 to 2 capsules 1 hour before sleep 14 capsule 0 01/07/2015 at Unknown time  . senna (SENOKOT) 8.6 MG TABS tablet Take 4 tablets by mouth at bedtime.   01/07/2015 at Unknown time  . azelastine (ASTELIN) 0.1 % nasal spray 1 to 2 sprays each nostril every 12 hours (Patient taking differently: Place 1-2 sprays into both nostrils 2 (two) times daily as needed  for allergies. 1 to 2 sprays each nostril every 12 hours) 30 mL 99 Past Week at Unknown time  . ondansetron (ZOFRAN) 8 MG tablet 1/2 to 1 tablet up to 3 x daily as needed for nausea. 30 tablet 3 01/03/2015  . testosterone cypionate (DEPOTESTOTERONE CYPIONATE) 200 MG/ML injection Inject 2 cc IM every 2 weeks or as directed by a physician. (Patient taking differently: Inject into the muscle every 28 (twenty-eight) days. Inject 2 cc IM every monthly or as directed by a physician.) 10 mL 2 2 weeks  . [DISCONTINUED] pregabalin (LYRICA) 50 MG capsule Take 1 to 3 capsules 1 hour before sleep (Patient not taking: Reported on 01/08/2015) 21 capsule 0 Not Taking at Unknown time    Assessment: 59 y.o. male who presented with shock d/t abdominal source, now s/p abdominal closure w/ wound VAC. Pt also had elevated cardiac markers and evidence of R heart strain on ECHO, which was concerning for a possible PE. A CT angio was unable to be performed d/t the pt's decline in renal fxn, so heparin was initiated for empiric treatment of a PE. The pt's renal fxn further declined and he is now on CRRT.  Today, Heparin was stopped for a CATH procedure. Cardiology is also mildly concerned for HIT d/t a decrease in platelets from 186 to 96 over 5 days and a HIT AB lab was ordered. Potential other causes of thrombocytopenia include multiple wound VAC changes and consumption d/t CRRT machine.  Bivalirudin will be initiated to treat a potential PE. Dr. Marchelle Gearing will re-evaluate the pt and need for continued bivalirudin tomorrow during rounds.  Plt 186 > 141 > 161 > 121 > 102 > 96 Hgb 13.6 and stable  Goal of Therapy:  aPTT 50-85 seconds Monitor platelets by anticoagulation protocol: Yes   Plan:  -Initiate bivalirudin 0.05 mg/kg/hr -aPTT 2 hr after initiation, then q4h until therapeutic x2, then daily -Daily CBC -Monitor for s/sx of bleeding  Arcola Jansky, PharmD Clinical Pharmacy Resident Pager:  [phone] 01/13/2015,2:21 PM   ============================   Addendum: - aPTT 51, low therapeutic range - no bleeding reported   Plan: - Increase bivalirudin slightly to 0.055 mg/kg/hr - Check 4 hr aPTT    Sondos Wolfman D. Laney Potash, PharmD, BCPS Pager:  [phone] 01/13/2015, 6:43 PM

## 2015-01-13 NOTE — H&P (View-Only) (Signed)
Patient Name: Jared [name] Date of Encounter: 01/13/2015  Active Problems:   Septic shock   Acute respiratory failure with hypoxia   Shock   Acute respiratory failure with hypoxemia   Cardiogenic shock   AKI (acute kidney injury)   Renal failure   Length of Stay: 5  SUBJECTIVE  Remains intubated, sedated, off pressors. Anuric. On CRRT. Cath rescheduled for today. Note 6 lb weight gain in last 3 days, commensurate with positive fluid balance, mostly from 3-4 days ago  CURRENT MEDS . antiseptic oral rinse  7 mL Mouth Rinse QID  . aspirin  81 mg Oral Daily  . chlorhexidine gluconate  15 mL Mouth Rinse BID  . insulin aspart  0-9 Units Subcutaneous 6 times per day  . pantoprazole (PROTONIX) IV  40 mg Intravenous QHS  . piperacillin-tazobactam (ZOSYN)  IV  2.25 g Intravenous 4 times per day  . sodium chloride  3 mL Intravenous Q12H  . vancomycin  1,250 mg Intravenous Q24H    OBJECTIVE   Intake/Output Summary (Last 24 hours) at 01/13/15 0833 Last data filed at 01/13/15 0800  Gross per 24 hour  Intake 2246.26 ml  Output   3657 ml  Net -1410.74 ml   Filed Weights   01/11/15 0415 01/12/15 0430 01/13/15 0400  Weight: 300 lb 14.9 oz (136.5 kg) 302 lb 4 oz (137.1 kg) 294 lb 8.6 oz (133.6 kg)    PHYSICAL EXAM Filed Vitals:   01/13/15 0600 01/13/15 0700 01/13/15 0751 01/13/15 0800  BP: 100/60 114/58 114/58   Pulse: 69  71   Temp:    97.7 F (36.5 C)  TempSrc:    Oral  Resp: Weight:      SpO2: 95%  100%    General: sedated, calm. Intubated Head: no evidence of trauma, PERRL, EOMI, no exophtalmos or lid lag, no myxedema, no xanthelasma; normal ears, nose and oropharynx Neck: normal jugular venous pulsations and no hepatojugular reflux; brisk carotid pulses without delay and no carotid bruits Chest: clear to auscultation, no signs of consolidation by percussion or palpation, normal fremitus, symmetrical and full respiratory excursions Cardiovascular: normal  position and quality of the apical impulse, regular rhythm, normal first and second heart sounds, no rubs or gallops, no murmur Abdomen: no tenderness or distention, no masses by palpation, no abnormal pulsatility or arterial bruits, normal bowel sounds, no hepatosplenomegaly Extremities: no clubbing, cyanosis or edema; 2+ radial, ulnar and brachial pulses bilaterally; 2+ right femoral, posterior tibial and dorsalis pedis pulses; 2+ left femoral, posterior tibial and dorsalis pedis pulses; no subclavian or femoral bruits Neurological: unable to perform, moves all 4 extremities randomly  LABS  CBC  Recent Labs  01/12/15 0414 01/13/15 0509  WBC 14.9* 11.1*  HGB 14.0 13.6  HCT 38.6* 39.1  MCV 85.2 87.1  PLT 102* 96*   Basic Metabolic Panel  Recent Labs  01/11/15 1215  01/12/15 1515 01/13/15 0509  NA 134*  < > 137 137  K 3.4*  < > 4.3 4.1  CL 99*  < > 103 102  CO2 20*  < > 22 22  GLUCOSE 119*  < > 83 114*  BUN 65*  < > 58* 54*  CREATININE 8.55*  < > 7.16* 6.72*  CALCIUM 6.7*  < > 7.2* 7.6*  MG 2.4  --   --  2.5*  PHOS 4.3  --  4.2 4.6  < > = values in this interval not displayed. Liver Function Tests  Recent Labs  01/11/15 0413 01/12/15 0414 01/12/15 1515 01/13/15 0509  AST 1601* 686*  --   --   ALT 2981* 2171*  --   --   ALKPHOS 82 79  --   --   BILITOT 2.5* 3.5*  --   --   PROT 3.9* 4.3*  --   --   ALBUMIN 2.0* 2.0* 2.0* 2.1*    Recent Labs  01/10/15 2342 01/12/15 0414  LIPASE 43 80*  AMYLASE 198* 218*   Cardiac Enzymes  Recent Labs  01/11/15 1215  TROPONINI 4.71*   Radiology Studies Imaging results have been reviewed and Dg Chest Port 1 View  01/13/2015   CLINICAL DATA:  Intubation .  EXAM: PORTABLE CHEST - 1 VIEW  COMPARISON:  01/12/2015.  FINDINGS: Endotracheal tube, bilateral IJ lines, NG tube in stable position. Cardiomegaly with pulmonary vascular prominence and bilateral interstitial prominence with pleural effusions noted. Findings consistent  congestive heart failure. Mild interim improvement. No pneumothorax . Prior cervical spine fusion.  IMPRESSION: 1. Lines and tubes in stable position. 2. Congestive heart failure with pulmonary interstitial edema and bilateral pleural effusions. Mild interim improvement from prior exam.   Electronically Signed   By: Maisie Fus  Register   On: 01/13/2015 07:32   Dg Chest Port 1 View  01/12/2015   CLINICAL DATA:  Respiratory failure.  EXAM: PORTABLE CHEST - 1 VIEW  COMPARISON:  01/11/2015.  FINDINGS: Endotracheal tube, NG tube, bilateral IJ lines in stable position. Cardiomegaly with pulmonary vascular prominence and bilateral pulmonary alveolar infiltrates consistent with bilateral pulmonary edema. Bilateral pleural effusions are noted. No pneumothorax. Prior cervical spine fusion.  IMPRESSION: 1. Lines and tubes in stable position. 2. Cardiomegaly with bilateral pulmonary alveolar infiltrates and pleural effusions consistent with congestive heart failure. Bilateral pneumonia cannot be excluded . No and improvement from prior exam.   Electronically Signed   By: Maisie Fus  Register   On: 01/12/2015 07:13    TELE NSR    ASSESSMENT AND PLAN  (Cardiogenic?) shock - improved, off pressors. For cardiac cath today. Note inferior hypokinesis and severe RV dysfunction. Consider RCA occlusion, but ECG remarkably silent. NSTEMI with severe troponin elevation after resuscitation (global ischemia versus RCA infarction) Bradycardia, resolved. Might also be RCA disease related, but possibly due to amiodarone given for VT/VF Acute renal failure on CRRT. Still essentially anuric S/P cardiorespiratory arrest - seems that a pulmonary event triggered this. Initially improved with narcan and BiPAP, developed ventricular arrhythmia after pressors were initiated for hypotension. May have subsequently had acute MI Thrombocytopenia  Slow decay in platelet count may be due to consumption in CRRT circuit, but raises concern for HIT.  Check panel.   [name] Fair, MD, [facility] [phone] office [phone] pager 01/13/2015 8:33 AM

## 2015-01-14 LAB — RENAL FUNCTION PANEL
ANION GAP: 13 (ref 5–15)
Albumin: 2 g/dL — ABNORMAL LOW (ref 3.5–5.0)
Albumin: 2 g/dL — ABNORMAL LOW (ref 3.5–5.0)
Anion gap: 10 (ref 5–15)
BUN: 56 mg/dL — AB (ref 6–20)
BUN: 54 mg/dL — ABNORMAL HIGH (ref 6–20)
CALCIUM: 7.8 mg/dL — AB (ref 8.9–10.3)
CHLORIDE: 101 mmol/L (ref 101–111)
CHLORIDE: 100 mmol/L — AB (ref 101–111)
CO2: 23 mmol/L (ref 22–32)
CO2: 21 mmol/L — AB (ref 22–32)
CREATININE: 6.56 mg/dL — AB (ref 0.61–1.24)
Calcium: 7.8 mg/dL — ABNORMAL LOW (ref 8.9–10.3)
Creatinine, Ser: 6 mg/dL — ABNORMAL HIGH (ref 0.61–1.24)
GFR calc Af Amer: 9 mL/min — ABNORMAL LOW (ref >60)
GFR calc non Af Amer: 8 mL/min — ABNORMAL LOW (ref >60)
GFR calc non Af Amer: 9 mL/min — ABNORMAL LOW (ref >60)
GFR, EST AFRICAN AMERICAN: 11 mL/min — AB (ref >60)
GLUCOSE: 75 mg/dL (ref 65–99)
Glucose, Bld: 88 mg/dL (ref 65–99)
POTASSIUM: 4 mmol/L (ref 3.5–5.1)
Phosphorus: 4 mg/dL (ref 2.5–4.6)
Phosphorus: 3.8 mg/dL (ref 2.5–4.6)
Potassium: 3.9 mmol/L (ref 3.5–5.1)
SODIUM: 134 mmol/L — AB (ref 135–145)
Sodium: 134 mmol/L — ABNORMAL LOW (ref 135–145)

## 2015-01-14 LAB — SEDIMENTATION RATE: Sed Rate: 55 mm/hr — ABNORMAL HIGH (ref 0–16)

## 2015-01-14 LAB — GLUCOSE, CAPILLARY
GLUCOSE-CAPILLARY: 75 mg/dL (ref 65–99)
GLUCOSE-CAPILLARY: 81 mg/dL (ref 65–99)
GLUCOSE-CAPILLARY: 83 mg/dL (ref 65–99)
Glucose-Capillary: 76 mg/dL (ref 65–99)
Glucose-Capillary: 76 mg/dL (ref 65–99)
Glucose-Capillary: 86 mg/dL (ref 65–99)

## 2015-01-14 LAB — CBC
HEMATOCRIT: 36.1 % — AB (ref 39.0–52.0)
HEMOGLOBIN: 12.7 g/dL — AB (ref 13.0–17.0)
MCH: 30.6 pg (ref 26.0–34.0)
MCHC: 35.2 g/dL (ref 30.0–36.0)
MCV: 87 fL (ref 78.0–100.0)
Platelets: 86 10*3/uL — ABNORMAL LOW (ref 150–400)
RBC: 4.15 MIL/uL — AB (ref 4.22–5.81)
RDW: 14.4 % (ref 11.5–15.5)
WBC: 10.9 10*3/uL — ABNORMAL HIGH (ref 4.0–10.5)

## 2015-01-14 LAB — HEPARIN INDUCED PLATELET AB (HIT ANTIBODY): HEPARIN INDUCED PLT AB: 0.212 {OD_unit} (ref 0.000–0.400)

## 2015-01-14 LAB — MAGNESIUM: MAGNESIUM: 2.6 mg/dL — AB (ref 1.7–2.4)

## 2015-01-14 LAB — TRIGLYCERIDES: Triglycerides: 380 mg/dL — ABNORMAL HIGH (ref <150)

## 2015-01-14 LAB — APTT
APTT: 59 s — AB (ref 24–37)
APTT: 60 s — AB (ref 24–37)

## 2015-01-14 MED ORDER — HEPARIN SODIUM (PORCINE) 1000 UNIT/ML DIALYSIS
1000.0000 [IU] | INTRAMUSCULAR | Status: DC | PRN
  Administered 2015-01-15: 3000 [IU] via INTRAVENOUS_CENTRAL
  Filled 2015-01-14: qty 3

## 2015-01-14 MED ORDER — SODIUM CHLORIDE 0.9 % IJ SOLN
10.0000 mL | Freq: Two times a day (BID) | INTRAMUSCULAR | Status: DC
  Administered 2015-01-15 – 2015-02-11 (×40): 10 mL via INTRAVENOUS

## 2015-01-14 MED ORDER — SODIUM CHLORIDE 0.9 % IJ SOLN
10.0000 mL | INTRAMUSCULAR | Status: DC | PRN
  Administered 2015-01-28: 10 mL via INTRAVENOUS
  Administered 2015-01-28: 20 mL via INTRAVENOUS
  Administered 2015-02-06: 10 mL via INTRAVENOUS
  Filled 2015-01-14 (×3): qty 10

## 2015-01-14 MED FILL — Lidocaine HCl Local Preservative Free (PF) Inj 1%: INTRAMUSCULAR | Qty: 30 | Status: AC

## 2015-01-14 MED FILL — Heparin Sodium (Porcine) 2 Unit/ML in Sodium Chloride 0.9%: INTRAMUSCULAR | Qty: 1000 | Status: AC

## 2015-01-14 NOTE — Progress Notes (Addendum)
ANTICOAGULATION CONSULT NOTE  Pharmacy Consult for Bivalirudin Indication: HIT/Concern for PE  Allergies  Allergen Reactions  . Morphine And Related Anaphylaxis and Shortness Of Breath  . Montelukast Other (See Comments)    unknown  . Neurontin [Gabapentin] Other (See Comments)    delusional  . Wellbutrin [Bupropion] Palpitations    Insomnia    Patient Measurements: Weight: 294 lb 8.6 oz (133.6 kg)  Vital Signs: Temp: 97.9 F (36.6 C) (08/24 0024) Temp Source: Oral (08/24 0024) BP: 128/73 mmHg (08/24 0100) Pulse Rate: 59 (08/24 0100)  Labs:  Recent Labs  01/11/15 0413 01/11/15 1215 01/12/15 0414 01/12/15 0801 01/12/15 1515 01/12/15 1712 01/13/15 0001 01/13/15 0509 01/13/15 1550 01/13/15 1730  HGB 14.5  --  14.0  --   --   --   --  13.6  --   --   HCT 39.3  --  38.6*  --   --   --   --  39.1  --   --   PLT 121*  --  102*  --   --   --   --  96*  --   --   APTT  --   --   --   --   --   --  60*  --   --  51*  LABPROT 19.8*  --   --  16.7*  --   --   --   --   --   --   INR 1.68*  --   --  1.34  --   --   --   --   --   --   HEPARINUNFRC  --   --   --  0.33  --  0.39  --  0.32  --   --   CREATININE 8.43* 8.55* 8.03*  --  7.16*  --   --  6.72* 6.93*  --   TROPONINI  --  4.71*  --   --   --   --   --   --   --   --     Estimated Creatinine Clearance: 16.4 mL/min (by C-G formula based on Cr of 6.93).    Assessment: 60 y.o. male with possible PE, thrombocytopenia and possible HIT, for Angiomax.  Recheck PTT tonight is 60.  Goal of Therapy:  aPTT 50-85 seconds Monitor platelets by anticoagulation protocol: Yes   Plan:  Continue bivalirudin at current rate  Geannie Risen, PharmD, BCPS    ADDENDUM =================================  -Pt having some bleeding from his mouth, will D/C bivalirudin for now -Pt to have CT Angio to see if continued [facility] is needed for a  suspected PE -Will re-evaluate continued need for bivalirudin pending HIT AB and CT angio  result  [place] Jansky, PharmD Clinical Pharmacy Resident Pager: [phone]

## 2015-01-14 NOTE — Progress Notes (Signed)
Patient Name: Jared [name] Date of Encounter: 01/14/2015  Active Problems:   Septic shock   Acute respiratory failure with hypoxia   Shock   Acute respiratory failure with hypoxemia   Cardiogenic shock   AKI (acute kidney injury)   Renal failure   NSTEMI (non-ST elevated myocardial infarction)   Length of Stay: 6  SUBJECTIVE Switched to argatroban due to concern for HIT, but stopped due to bleeding. Cath showed occluded OM branch (culprit? Chronic?). RCA could not be selectively injected, but was seen to be patent.  CURRENT MEDS . antiseptic oral rinse  7 mL Mouth Rinse QID  . aspirin  300 mg Rectal Daily  . chlorhexidine gluconate  15 mL Mouth Rinse BID  . insulin aspart  0-9 Units Subcutaneous 6 times per day  . pantoprazole (PROTONIX) IV  40 mg Intravenous QHS  . piperacillin-tazobactam (ZOSYN)  IV  2.25 g Intravenous 4 times per day  . sodium chloride  3 mL Intravenous Q12H  . sodium chloride  3 mL Intravenous Q12H    OBJECTIVE   Intake/Output Summary (Last 24 hours) at 01/14/15 1314 Last data filed at 01/14/15 1300  Gross per 24 hour  Intake 1708.2 ml  Output   3849 ml  Net -2140.8 ml   Filed Weights   01/12/15 0430 01/13/15 0400 01/14/15 0500  Weight: 137.1 kg (302 lb 4 oz) 133.6 kg (294 lb 8.6 oz) 133.5 kg (294 lb 5 oz)    PHYSICAL EXAM Filed Vitals:   01/14/15 1136 01/14/15 1150 01/14/15 1200 01/14/15 1300  BP:  119/57 114/54 94/55  Pulse:  69 68 63  Temp: 98.3 F (36.8 C)     TempSrc: Oral     Resp:  25 25 25   Weight:      SpO2:  100% 100% 95%   General: Alert, oriented x3, no distress Head: no evidence of trauma, PERRL, EOMI, no exophtalmos or lid lag, no myxedema, no xanthelasma; normal ears, nose and oropharynx Neck: normal jugular venous pulsations and no hepatojugular reflux; brisk carotid pulses without delay and no carotid bruits Chest: clear to auscultation, no signs of consolidation by percussion or palpation, normal fremitus,  symmetrical and full respiratory excursions Cardiovascular: normal position and quality of the apical impulse, regular rhythm, normal first and second heart sounds, no rubs or gallops, no murmur Abdomen: no tenderness or distention, no masses by palpation, no abnormal pulsatility or arterial bruits, normal bowel sounds, no hepatosplenomegaly Extremities: no clubbing, cyanosis or edema; 2+ radial, ulnar and brachial pulses bilaterally; 2+ right femoral, posterior tibial and dorsalis pedis pulses; 2+ left femoral, posterior tibial and dorsalis pedis pulses; no subclavian or femoral bruits Neurological: grossly nonfocal  LABS  CBC  Recent Labs  01/13/15 0509 01/14/15 0328  WBC 11.1* 10.9*  HGB 13.6 12.7*  HCT 39.1 36.1*  MCV 87.1 87.0  PLT 96* 86*   Basic Metabolic Panel  Recent Labs  01/13/15 0509 01/13/15 1550 01/14/15 0328  NA 137 136 134*  K 4.1 4.1 3.9  CL 102 104 101  CO2 22 22 23   GLUCOSE 114* 77 75  BUN 54* 58* 56*  CREATININE 6.72* 6.93* 6.56*  CALCIUM 7.6* 7.5* 7.8*  MG 2.5*  --  2.6*  PHOS 4.6 4.0 4.0   Liver Function Tests  Recent Labs  01/12/15 0414  01/13/15 1550 01/14/15 0328  AST 686*  --   --   --   ALT 2171*  --   --   --  ALKPHOS 79  --   --   --   BILITOT 3.5*  --   --   --   PROT 4.3*  --   --   --   ALBUMIN 2.0*  < > 1.9* 2.0*  < > = values in this interval not displayed.  Recent Labs  01/12/15 0414  LIPASE 80*  AMYLASE 218*   Fasting Lipid Panel  Recent Labs  01/14/15 0328  TRIG 380*    Radiology Studies Imaging results have been reviewed and Dg Chest [facility]  01/13/2015   CLINICAL DATA:  Intubation .  EXAM: PORTABLE CHEST - 1 VIEW  COMPARISON:  01/12/2015.  FINDINGS: Endotracheal tube, bilateral IJ lines, NG tube in stable position. Cardiomegaly with pulmonary vascular prominence and bilateral interstitial prominence with pleural effusions noted. Findings consistent congestive heart failure. Mild interim improvement. No  pneumothorax . Prior cervical spine fusion.  IMPRESSION: 1. Lines and tubes in stable position. 2. Congestive heart failure with pulmonary interstitial edema and bilateral pleural effusions. Mild interim improvement from prior exam.   Electronically Signed   By: Jared [name]  Register   On: 01/13/2015 07:32   Dg Abd Portable 1v  01/13/2015   CLINICAL DATA:  Abdominal distension of uncertain etiology, septic shock, acute respiratory failure, acute renal injury.  EXAM: PORTABLE ABDOMEN - 1 VIEW  COMPARISON:  Portable abdominal film of January 09, 2015  FINDINGS: There is a relative paucity of bowel gas. Distended bowel loops are not observed. A nasogastric tube is in place with the tip in the region of the distal pylorus or proximal duodenum. There are no abnormal soft tissue calcifications. The patient has undergone a previous lumbosacral fusion posteriorly. The right-sided femoral catheter has been removed. The left-sided femoral catheter remains.  IMPRESSION: No acute intra-abdominal abnormality is demonstrated. The esophagogastric tube appears to be in appropriate position.   Electronically Signed   By: David  [place] M.D.   On: 01/13/2015 11:13   TELE NSR   ASSESSMENT AND PLAN  (Cardiogenic?) shock - improved, off pressors. For cardiac cath today. Note inferior hypokinesis and severe RV dysfunction. RCA occlusion was not confirmed at cath. Possible pulmonary embolism, but note negative venous Dopplers. NSTEMI with severe troponin elevation after resuscitation (global ischemia?). OM branch occlusion appears insufficient to cause such an elevation in troponin Bradycardia, resolved. Possibly due to amiodarone given for VT/VF Acute renal failure on CRRT. Still essentially anuric. Planning to switch to intermittent HD. S/P cardiorespiratory arrest - seems that a pulmonary event triggered this. Initially improved with narcan and BiPAP, developed ventricular arrhythmia after pressors were initiated for  hypotension. May have subsequently had acute MI. Hard to put everything together. Thrombocytopenia Slow decay in platelet count may be due to consumption in CRRT circuit, but raises concern for HIT. Labs pending. Argatroban stopped due to bleeding.  Jared [name] Fair, MD, [facility] [phone] office [phone] pager 01/14/2015 1:14 PM

## 2015-01-14 NOTE — Progress Notes (Signed)
Subjective: Interval History: off pressor, having some bleeding from mouth.  Objective: Vital signs in last 24 hours: Temp:  [97.5 F (36.4 C)-99.2 F (37.3 C)] 98 F (36.7 C) (08/24 0753) Pulse Rate:  [52-81] 61 (08/24 0700) Resp:  [18-25] 25 (08/24 0700) BP: (98-155)/(56-91) 114/58 mmHg (08/24 0700) SpO2:  [53 %-100 %] 98 % (08/24 0700) Arterial Line BP: (106-176)/(49-89) 106/49 mmHg (08/24 0700) FiO2 (%):  [1 %-100 %] 40 % (08/24 0400) Weight:  [294 lb 5 oz (133.5 kg)] 294 lb 5 oz (133.5 kg) (08/24 0500) Weight change: -3.5 oz (-0.1 kg)  Intake/Output from previous day: 08/23 0701 - 08/24 0700 In: 1776.4 [I.V.:1197.4; NG/GT:130; IV Piggyback:449] Out: 3588 [Urine:180; Emesis/NG output:725] Intake/Output this shift: Total I/O In: -  Out: 96 [Other:96]  General: intubated and sedated. Heent: [place]/at, perrl Card: RRR, no m/r/g Lungs: coarse breathe sounds Abd: distended, midline wound covered with wound vac.  Ext: no edema. Well perfused ext's.   Lab Results:  Recent Labs  01/13/15 0509 01/14/15 0328  WBC 11.1* 10.9*  HGB 13.6 12.7*  HCT 39.1 36.1*  PLT 96* 86*   BMET:   Recent Labs  01/13/15 1550 01/14/15 0328  NA 136 134*  K 4.1 3.9  CL 104 101  CO2 22 23  GLUCOSE 77 75  BUN 58* 56*  CREATININE 6.93* 6.56*  CALCIUM 7.5* 7.8*   No results for input(s): PTH in the last 72 hours. Iron Studies: No results for input(s): IRON, TIBC, TRANSFERRIN, FERRITIN in the last 72 hours. Studies/Results: Dg Chest Port 1 View  01/13/2015   CLINICAL DATA:  Intubation .  EXAM: PORTABLE CHEST - 1 VIEW  COMPARISON:  01/12/2015.  FINDINGS: Endotracheal tube, bilateral IJ lines, NG tube in stable position. Cardiomegaly with pulmonary vascular prominence and bilateral interstitial prominence with pleural effusions noted. Findings consistent congestive heart failure. Mild interim improvement. No pneumothorax . Prior cervical spine fusion.  IMPRESSION: 1. Lines and tubes in stable  position. 2. Congestive heart failure with pulmonary interstitial edema and bilateral pleural effusions. Mild interim improvement from prior exam.   Electronically Signed   By: Maisie Fus  Register   On: 01/13/2015 07:32   Dg Abd Portable 1v  01/13/2015   CLINICAL DATA:  Abdominal distension of uncertain etiology, septic shock, acute respiratory failure, acute renal injury.  EXAM: PORTABLE ABDOMEN - 1 VIEW  COMPARISON:  Portable abdominal film of January 09, 2015  FINDINGS: There is a relative paucity of bowel gas. Distended bowel loops are not observed. A nasogastric tube is in place with the tip in the region of the distal pylorus or proximal duodenum. There are no abnormal soft tissue calcifications. The patient has undergone a previous lumbosacral fusion posteriorly. The right-sided femoral catheter has been removed. The left-sided femoral catheter remains.  IMPRESSION: No acute intra-abdominal abnormality is demonstrated. The esophagogastric tube appears to be in appropriate position.   Electronically Signed   By: David  [place] M.D.   On: 01/13/2015 11:13    I have reviewed the patient's current medications.  Assessment/Plan:  59 yo male with hx of HTN, HLD, here after being found unresponsive, found to be in Shock likely 2/2 PE, also has sepsis.   1. Shock - likely obstructive shock from possible PE (RA and RV dilated, RV hypocontractile on echo), but could also be from possible sepsis (PCT high, tmax 101.1, on abx) - on heparin drip, cardiology on board. CT angio for PE deferred until renal improvement. Off pressors now. 2. Oliguric  AKI on CKD 2 - likely 2/2 to shock. Renal u/s shows chronic medial renal disease.  Failed trial of lasix. On CRRT since 01/10/15.  3. Hypoxic resp failure - likely 2/2 to PE, also does have pulm edema and pulm venous congestion. Remains on vent.  4. shocked Liver - AST/ALT slowly coming down.  5. s/p exp lap and also fascia closure. Has wound vac.  6. Non gap metabolic  acidosis - likely 2/2 lactic acidosis (had gap before), now 2/2 to renal failure - in the setting of shock   7. Elevated troponin - LHC showed occluded large branch of obstuse marginal - supportive care per cards.  9. Hypokalemia - repleted.  Plan:  1. Continue CRRT.  2. Rest per primary team.     LOS: 6 days   Ahmed, Tasrif 01/14/2015,8:02 AM  I have seen and examined this patient and agree with plan as outlined by Dr. Tasia Catchings.  Will plan to continue with CVVHDF for now and possible transition to IHD in the next 24-48 hours if he remains hemodynamically stable.  Cardiac cath with occlusion of OM branch and unsure if this could have caused such an hemodynamic response.  Await follow up from Cardiology. Ginni Eichler A,MD 01/14/2015 10:06 AM

## 2015-01-14 NOTE — Progress Notes (Addendum)
PULMONARY / CRITICAL CARE MEDICINE   Name: CARL ZALES MRN: [id number] DOB: September 11, 1955    ADMISSION DATE:  01/08/2015 CONSULTATION DATE:  8/18  REFERRING MD :  EDP Dr Criss Alvine  CHIEF COMPLAINT:  Found down  INITIAL PRESENTATION:   59 yo male had severe abdominal pain, and then found unresponsive at home.  In ER he was in shock, cyanotic, with lactic acidosis, and hyperkalemia.  STUDIES:  8/18 Echo >> EF 45 to 50%, mod RV dilation, mod/severe RV dysfx, PAS 47 mmHg 8/18 Renal u/s >> b/l renal echogenicity  SIGNIFICANT EVENTS: 8/18 Admit, CCS, cardiology consulted; brief PEA arrest; laparotomy with placement of wound vac; started heparin gtt for presumed PE 8/19 Renal consulted. Duplex LE negative for DVT 8/20 CRRT  8/21 OR for abd wound closure    8/21 :  To OR this am with abd wound closure , did well . CRRT started yesterday , no change in scr or UOP.  Bradycardia overnight with Amio stopped. HR improved but remains in upper 40s. Remains on pressors with Levo and Vaso , decreased demands    01/12/15; For cath later 01/12/2015 per Dr Jomarie Longs. On pressors. Anuric. On CRRT. Off pressors. Improved Shock Liver. On fent gtt and heparing gtt. 40% fio2   01/13/15: Cath delayed to 01/13/2015. Off pressors. On CRRT. On fent gtt . Off heparin gtt -> due to dropping platelet. HITT panel sent. RN reports significant abd tenderness. CCS reports patient has ileys      SUBJECTIVE/OVERNIGHT/INTERVAL HX 01/14/15: CCS reports ongoing ileus but planning TF in 1-2days. Offpressors. Ongong CRRT. Cath relatively clean except occlusion of OM branch - not felt to be primary cause of current illness. This is per renal note. No cath note seen in EMR.  On dilaudid and diprivan gtt for sedation.    Primarhy dx stll unclear and is on empiric Bivalirudin (angiomax) for PE - HITT panel pending. DUplex LE - repeat negative for DVT . RN reports bleeding around oral cavity. Platelets at 86K    VITAL  SIGNS: Temp:  [97.5 F (36.4 C)-98.1 F (36.7 C)] 98 F (36.7 C) (08/24 0753) Pulse Rate:  [55-81] 65 (08/24 1100) Resp:  [18-25] 25 (08/24 1100) BP: (98-155)/(53-91) 98/53 mmHg (08/24 1100) SpO2:  [95 %-100 %] 95 % (08/24 1100) Arterial Line BP: (93-176)/(47-89) 93/47 mmHg (08/24 1100) FiO2 (%):  [1 %-100 %] 40 % (08/24 0819) Weight:  [133.5 kg (294 lb 5 oz)] 133.5 kg (294 lb 5 oz) (08/24 0500) HEMODYNAMICS:   VENTILATOR SETTINGS: Vent Mode:  [-] PRVC FiO2 (%):  [1 %-100 %] 40 % Set Rate:  [25 bmp] 25 bmp Vt Set:  [670 mL] 670 mL PEEP:  [5 cmH20] 5 cmH20 Plateau Pressure:  [21 cmH20-25 cmH20] 23 cmH20 INTAKE / OUTPUT:  Intake/Output Summary (Last 24 hours) at 01/14/15 1130 Last data filed at 01/14/15 1108  Gross per 24 hour  Intake 1719.33 ml  Output   3550 ml  Net -1830.67 ml    PHYSICAL EXAMINATION: General: critically ill appearing on vent  Neuro: sedated moves extremities. RASS -3 on dilaudid and diprivan - followed commands per RN HEENT: ETT in place Cardiovascular: SB , no murmur Lungs: faint basilar crackles Abdomen: mild distention, wound vac in place, tenderness diffusely + but improved Musculoskeletal: no edema  LABS:  PULMONARY  Recent Labs Lab 01/09/15 0008 01/09/15 0410 01/09/15 1901 01/10/15 0408 01/10/15 1615 01/11/15 1131  PHART 7.390 7.437 7.339* 7.348*  --  7.439  PCO2ART 35.6  32.4* 45.3* 41.9  --  30.4*  PO2ART 118* 99.8 88.0 106*  --  79.8*  HCO3 21.0 21.7 24.0 22.2  --  20.3  TCO2 22.1 22.7 25 23.4  --  21.2  O2SAT 97.3 96.7 95.0 96.7 76.4 95.1    CBC  Recent Labs Lab 01/12/15 0414 01/13/15 0509 01/14/15 0328  HGB 14.0 13.6 12.7*  HCT 38.6* 39.1 36.1*  WBC 14.9* 11.1* 10.9*  PLT 102* 96* 86*    COAGULATION  Recent Labs Lab 01/08/15 1350 01/09/15 1630 01/11/15 0413 01/12/15 0801  INR 1.54* 2.24* 1.68* 1.34    CARDIAC    Recent Labs Lab 01/09/15 0149 01/09/15 0914 01/09/15 1512 01/09/15 2129 01/11/15 1215   TROPONINI >65.00* >65.00* >65.00* 49.60* 4.71*   No results for input(s): PROBNP in the last 168 hours.   CHEMISTRY  Recent Labs Lab 01/10/15 0330 01/11/15 0413 01/11/15 1215 01/12/15 0414 01/12/15 1515 01/13/15 0509 01/13/15 1550 01/14/15 0328  NA 135 133* 134* 137 137 137 136 134*  K 3.8 3.2* 3.4* 4.0 4.3 4.1 4.1 3.9  CL 96* 98* 99* 103 103 102 104 101  CO2 21* 20* 20* 23 22 22 22 23   GLUCOSE 127* 143* 119* 87 83 114* 77 75  BUN 71* 67* 65* 59* 58* 54* 58* 56*  CREATININE 8.72* 8.43* 8.55* 8.03* 7.16* 6.72* 6.93* 6.56*  CALCIUM 6.8* 6.7* 6.7* 7.0* 7.2* 7.6* 7.5* 7.8*  MG 2.2 2.4 2.4  --   --  2.5*  --  2.6*  PHOS 4.6 3.4 4.3  --  4.2 4.6 4.0 4.0   Estimated Creatinine Clearance: 17.3 mL/min (by C-G formula based on Cr of 6.56).   LIVER  Recent Labs Lab 01/08/15 1100 01/08/15 1350 01/09/15 1630 01/10/15 0330 01/11/15 0413 01/12/15 0414 01/12/15 0801 01/12/15 1515 01/13/15 0509 01/13/15 1550 01/14/15 0328  AST 626*  --  4557* 2907* 1601* 686*  --   --   --   --   --   ALT 729*  --  4100* 3366* 2981* 2171*  --   --   --   --   --   ALKPHOS 73  --  83 74 82 79  --   --   --   --   --   BILITOT 1.3*  --  1.7* 1.7* 2.5* 3.5*  --   --   --   --   --   PROT 7.0  --  4.1* 3.7* 3.9* 4.3*  --   --   --   --   --   ALBUMIN 4.1  --  2.4* 2.1* 2.0* 2.0*  --  2.0* 2.1* 1.9* 2.0*  INR  --  1.54* 2.24*  --  1.68*  --  1.34  --   --   --   --      INFECTIOUS  Recent Labs Lab 01/09/15 0130 01/09/15 0945 01/09/15 1630 01/10/15 0330 01/11/15 0413  LATICACIDVEN 3.5*  --  1.7 1.5  --   PROCALCITON  --  40.68  --  37.87 25.12     ENDOCRINE CBG (last 3)   Recent Labs  01/13/15 2340 01/14/15 0315 01/14/15 0752  GLUCAP 76 75 81         IMAGING x48h  - image(s) personally visualized  -   highlighted in bold Dg Chest Port 1 View  01/13/2015   CLINICAL DATA:  Intubation .  EXAM: PORTABLE CHEST - 1 VIEW  COMPARISON:  01/12/2015.  FINDINGS: Endotracheal  tube, bilateral IJ lines, NG tube in stable position. Cardiomegaly with pulmonary vascular prominence and bilateral interstitial prominence with pleural effusions noted. Findings consistent congestive heart failure. Mild interim improvement. No pneumothorax . Prior cervical spine fusion.  IMPRESSION: 1. Lines and tubes in stable position. 2. Congestive heart failure with pulmonary interstitial edema and bilateral pleural effusions. Mild interim improvement from prior exam.   Electronically Signed   By: Maisie Fus  Register   On: 01/13/2015 07:32   Dg Abd Portable 1v  01/13/2015   CLINICAL DATA:  Abdominal distension of uncertain etiology, septic shock, acute respiratory failure, acute renal injury.  EXAM: PORTABLE ABDOMEN - 1 VIEW  COMPARISON:  Portable abdominal film of January 09, 2015  FINDINGS: There is a relative paucity of bowel gas. Distended bowel loops are not observed. A nasogastric tube is in place with the tip in the region of the distal pylorus or proximal duodenum. There are no abnormal soft tissue calcifications. The patient has undergone a previous lumbosacral fusion posteriorly. The right-sided femoral catheter has been removed. The left-sided femoral catheter remains.  IMPRESSION: No acute intra-abdominal abnormality is demonstrated. The esophagogastric tube appears to be in appropriate position.   Electronically Signed   By: David  [place] M.D.   On: 01/13/2015 11:13         ASSESSMENT / PLAN:  PULMONARY ETT 8/18 >>> A: Acute respiratory failure 2nd to presumed PE, lactic acidosis. Hx of OSA    - does not meet sbt criteria 01/14/2015. On angiomax since 01/13/15 due to presumed PE and suspected HITT.  Duplex LE negative for DVT 01/13/15  P:   Hold angiomax (oral bleeding) Await HITT panel Will check with renal about doing CT angio chest looking for PE (he might be in ESRD anways) Full vent support F/u CXR, ABG Adjust PEEP/FiO2 to keep SpO2 > 92% VAP precautions      CARDIOVASCULAR Rt femoral CVL 8/18 >>8/20 Lt femoral A line 8/18 >> RIJ HD 8/20 >> LIJ 8/20 >>  A: Shock >> likely cardiogenic. RV failure with possible PE.-although venous doppler neg for DVT ? MI related  Hx of HTN, HLD. Atrial fib  Probable Acute MI (w/ ST elevation /elevation troponin )     - Off amio ? Date. Off pressors since 01/12/2015 AM.  Cath 8/06/25/14 OM branch occlusion per report.   P:  Hold outpt lipitor, bisoprolol, HCTZ, losartan, minoxidil  RENAL  #Baseline CKD>> baseline creatinine 1.14 from 12/17/14., BPH   #Admit   - ATN with metab acidosis and hyperkalemia - renal US neg  - CRRT started per renal 8/20     - On CRRT ongoing. Making 40cc in 24h  P:   CRRT per Renal since 8/20  - aiming for HD in a few days Check auotimmune panel     GASTROINTESTINAL A: Acute abdominal pain s/p laparotomy with wound vac 8/18.>wound vac change 8/19 d/t eviceration>to OR 8/21 with wound closure  Shock Liver >LFT tr down   - TF still not started. CCS holding off due to ileus but plans to start tube ffeeds in 1-2 days. ABd tenderness + but decreased.  P:   Post-op care per surgery Protonix for SUP NGT to LIS per CCS Wound VAC with changes tTS TF start decision per CCS TNA decision per CCS   HEMATOLOGIC A: Coagulopathy with elevated INR probably secnodary to shock liver >improving with INR tr down  Thrombocytopenia - suspected HITT - on angiomax   - bleeding around oral cavity  P:  F/u CBC and INR  Stop angiomax x 24h Await HITT  INFECTIOUS Culture - negative A: Sepsis with presumed abdominal source.>exp lap was neg .     ->Now w/ wound vac will need to cont abx for now . Follow Cx and PCT ,    P:   Day 7  vancomycin, zosyn -> stop vanc    ENDOCRINE A: Hyperglycemia  P:   SSI   NEUROLOGIC A: Acute metabolic encephalopathy >> improved 8/18.>follows commands off sedation  Hx of chronic back pain, anxiety, depression.   - on  sedation gtt but has sginificant abdominal pain improved and follows commands  P:   RASS goal: -1  Wean sedation as able  Dilaudid gtt Diprivan gtt - check lactate Use versed prn Hold outpt xanax, celexa, oxycodone, lyrica   FAMILY Updates - wife updated at bedside 01/12/2015 with presence of RN Darel Hong  IDT meet: due on LOS 7 days which is 01/16/15  SUMMARY  = no clear etioogy for presentation. Sepsis and PE likely causes. On angiomax but having oral bleding so stopping. On CRRT. Will ask renal about getting CTA rule out pe depending on renal prognosis - if no PE then no need for anticoagulation. Checking autoimmune    The patient is critically ill with multiple organ systems failure and requires high complexity decision making for assessment and support, frequent evaluation and titration of therapies, application of advanced monitoring technologies and extensive interpretation of multiple databases.   Critical Care Time devoted to patient care services described in this note is  35  Minutes. This time reflects time of care of this signee Dr Kalman Shan. This critical care time does not reflect procedure time, or teaching time or supervisory time of PA/NP/Med student/Med Resident etc but could involve care discussion time    Dr. Kalman Shan, M.D., [facility].C.P Pulmonary and Critical Care Medicine Staff Physician [facility] Pager: [phone], If no answer or between  15:00h - 7:00h: call [phone]  01/14/2015 11:30 AM

## 2015-01-14 NOTE — Progress Notes (Signed)
1 Day Post-Op  Subjective: Pt with no acute changes.  Objective: Vital signs in last 24 hours: Temp:  [97.5 F (36.4 C)-99.2 F (37.3 C)] 98 F (36.7 C) (08/24 0753) Pulse Rate:  [52-81] 71 (08/24 0819) Resp:  [18-25] 25 (08/24 0819) BP: (98-155)/(56-91) 148/72 mmHg (08/24 0819) SpO2:  [53 %-100 %] 100 % (08/24 0819) Arterial Line BP: (106-176)/(49-89) 106/49 mmHg (08/24 0700) FiO2 (%):  [1 %-100 %] 40 % (08/24 0819) Weight:  [133.5 kg (294 lb 5 oz)] 133.5 kg (294 lb 5 oz) (08/24 0500)    Intake/Output from previous day: 08/23 0701 - 08/24 0700 In: 1776.4 [I.V.:1197.4; NG/GT:130; IV Piggyback:449] Out: 3588 [Urine:180; Emesis/NG output:725] Intake/Output this shift: Total I/O In: 51.8 [I.V.:51.8] Out: 201 [Other:201]  General appearance: sedated Cardio: regular rate and rhythm, S1, S2 normal, no murmur, click, rub or gallop GI: soft, nd, midline wound c/d/i  Lab Results:   Recent Labs  01/13/15 0509 01/14/15 0328  WBC 11.1* 10.9*  HGB 13.6 12.7*  HCT 39.1 36.1*  PLT 96* 86*   BMET  Recent Labs  01/13/15 1550 01/14/15 0328  NA 136 134*  K 4.1 3.9  CL 104 101  CO2 22 23  GLUCOSE 77 75  BUN 58* 56*  CREATININE 6.93* 6.56*  CALCIUM 7.5* 7.8*   PT/INR  Recent Labs  01/12/15 0801  LABPROT 16.7*  INR 1.34   ABG  Recent Labs  01/11/15 1131  PHART 7.439  HCO3 20.3    Studies/Results: Dg Chest Port 1 View  01/13/2015   CLINICAL DATA:  Intubation .  EXAM: PORTABLE CHEST - 1 VIEW  COMPARISON:  01/12/2015.  FINDINGS: Endotracheal tube, bilateral IJ lines, NG tube in stable position. Cardiomegaly with pulmonary vascular prominence and bilateral interstitial prominence with pleural effusions noted. Findings consistent congestive heart failure. Mild interim improvement. No pneumothorax . Prior cervical spine fusion.  IMPRESSION: 1. Lines and tubes in stable position. 2. Congestive heart failure with pulmonary interstitial edema and bilateral pleural  effusions. Mild interim improvement from prior exam.   Electronically Signed   By: Maisie Fus  Register   On: 01/13/2015 07:32   Dg Abd Portable 1v  01/13/2015   CLINICAL DATA:  Abdominal distension of uncertain etiology, septic shock, acute respiratory failure, acute renal injury.  EXAM: PORTABLE ABDOMEN - 1 VIEW  COMPARISON:  Portable abdominal film of January 09, 2015  FINDINGS: There is a relative paucity of bowel gas. Distended bowel loops are not observed. A nasogastric tube is in place with the tip in the region of the distal pylorus or proximal duodenum. There are no abnormal soft tissue calcifications. The patient has undergone a previous lumbosacral fusion posteriorly. The right-sided femoral catheter has been removed. The left-sided femoral catheter remains.  IMPRESSION: No acute intra-abdominal abnormality is demonstrated. The esophagogastric tube appears to be in appropriate position.   Electronically Signed   By: David  [place] M.D.   On: 01/13/2015 11:13    Anti-infectives: Anti-infectives    Start     Dose/Rate Route Frequency Ordered Stop   01/11/15 1630  vancomycin (VANCOCIN) 1,250 mg in sodium chloride 0.9 % 250 mL IVPB     1,250 mg 166.7 mL/hr over 90 Minutes Intravenous Every 24 hours 01/11/15 1029     01/10/15 1300  vancomycin (VANCOCIN) 1,750 mg in sodium chloride 0.9 % 500 mL IVPB  Status:  Discontinued     1,750 mg 250 mL/hr over 120 Minutes Intravenous Every 48 hours 01/08/15 1327 01/09/15 1537  01/10/15 1300  vancomycin (VANCOCIN) 1,250 mg in sodium chloride 0.9 % 250 mL IVPB  Status:  Discontinued     1,250 mg 166.7 mL/hr over 90 Minutes Intravenous Every 24 hours 01/10/15 0947 01/11/15 1029   01/10/15 1200  piperacillin-tazobactam (ZOSYN) IVPB 2.25 g     2.25 g 100 mL/hr over 30 Minutes Intravenous 4 times per day 01/10/15 0929     01/09/15 1400  piperacillin-tazobactam (ZOSYN) IVPB 2.25 g  Status:  Discontinued     2.25 g 100 mL/hr over 30 Minutes Intravenous 3 times  per day 01/09/15 0908 01/10/15 0929   01/09/15 1100  cefTRIAXone (ROCEPHIN) 1 g in dextrose 5 % 50 mL IVPB  Status:  Discontinued     1 g 100 mL/hr over 30 Minutes Intravenous Every 24 hours 01/08/15 1133 01/08/15 1327   01/09/15 1100  azithromycin (ZITHROMAX) 500 mg in dextrose 5 % 250 mL IVPB  Status:  Discontinued     500 mg 250 mL/hr over 60 Minutes Intravenous Every 24 hours 01/08/15 1133 01/08/15 1327   01/08/15 2200  piperacillin-tazobactam (ZOSYN) IVPB 3.375 g  Status:  Discontinued     3.375 g 12.5 mL/hr over 240 Minutes Intravenous 3 times per day 01/08/15 1327 01/09/15 0908   01/08/15 1400  vancomycin (VANCOCIN) 2,500 mg in sodium chloride 0.9 % 500 mL IVPB     2,500 mg 250 mL/hr over 120 Minutes Intravenous  Once 01/08/15 1327 01/08/15 1747   01/08/15 1330  piperacillin-tazobactam (ZOSYN) IVPB 3.375 g  Status:  Discontinued     3.375 g 100 mL/hr over 30 Minutes Intravenous  Once 01/08/15 1316 01/10/15 0929   01/08/15 1130  cefTRIAXone (ROCEPHIN) 1 g in dextrose 5 % 50 mL IVPB     1 g 100 mL/hr over 30 Minutes Intravenous  Once 01/08/15 1126 01/08/15 1220   01/08/15 1130  azithromycin (ZITHROMAX) 500 mg in dextrose 5 % 250 mL IVPB     500 mg 250 mL/hr over 60 Minutes Intravenous  Once 01/08/15 1126 01/08/15 1322      Assessment/Plan: POD 6, 2 s/p ex lap with open abdominal VAC placement and then closure -Ileus-con't NGT possible TF to start in next 1-2d -Con't dressing changes BID MMP -per primary service and other specialties -cardiac cath today as it was cancelled yesterday DVT prophylaxis -SCDs/heparin drip     LOS: 6 days    Marigene Ehlers., Jed [place] 01/14/2015

## 2015-01-15 ENCOUNTER — Inpatient Hospital Stay (HOSPITAL_COMMUNITY): Payer: Medicare Other

## 2015-01-15 LAB — COMPREHENSIVE METABOLIC PANEL
ALBUMIN: 2 g/dL — AB (ref 3.5–5.0)
ALT: 573 U/L — ABNORMAL HIGH (ref 17–63)
ANION GAP: 13 (ref 5–15)
AST: 93 U/L — ABNORMAL HIGH (ref 15–41)
Alkaline Phosphatase: 65 U/L (ref 38–126)
BILIRUBIN TOTAL: 5.3 mg/dL — AB (ref 0.3–1.2)
BUN: 58 mg/dL — ABNORMAL HIGH (ref 6–20)
CO2: 21 mmol/L — ABNORMAL LOW (ref 22–32)
Calcium: 7.9 mg/dL — ABNORMAL LOW (ref 8.9–10.3)
Chloride: 102 mmol/L (ref 101–111)
Creatinine, Ser: 6.51 mg/dL — ABNORMAL HIGH (ref 0.61–1.24)
GFR calc Af Amer: 10 mL/min — ABNORMAL LOW (ref >60)
GFR calc non Af Amer: 8 mL/min — ABNORMAL LOW (ref >60)
GLUCOSE: 81 mg/dL (ref 65–99)
POTASSIUM: 3.9 mmol/L (ref 3.5–5.1)
Sodium: 136 mmol/L (ref 135–145)
TOTAL PROTEIN: 4.9 g/dL — AB (ref 6.5–8.1)

## 2015-01-15 LAB — HIV ANTIBODY (ROUTINE TESTING W REFLEX): HIV Screen 4th Generation wRfx: NONREACTIVE

## 2015-01-15 LAB — CBC WITH DIFFERENTIAL/PLATELET
BASOS PCT: 0 % (ref 0–1)
Basophils Absolute: 0 10*3/uL (ref 0.0–0.1)
EOS ABS: 0.5 10*3/uL (ref 0.0–0.7)
EOS PCT: 5 % (ref 0–5)
HCT: 33.9 % — ABNORMAL LOW (ref 39.0–52.0)
HEMOGLOBIN: 11.7 g/dL — AB (ref 13.0–17.0)
Lymphocytes Relative: 8 % — ABNORMAL LOW (ref 12–46)
Lymphs Abs: 0.8 10*3/uL (ref 0.7–4.0)
MCH: 30.6 pg (ref 26.0–34.0)
MCHC: 34.5 g/dL (ref 30.0–36.0)
MCV: 88.7 fL (ref 78.0–100.0)
MONOS PCT: 7 % (ref 3–12)
Monocytes Absolute: 0.7 10*3/uL (ref 0.1–1.0)
NEUTROS PCT: 81 % — AB (ref 43–77)
Neutro Abs: 8.3 10*3/uL — ABNORMAL HIGH (ref 1.7–7.7)
Platelets: 90 10*3/uL — ABNORMAL LOW (ref 150–400)
RBC: 3.82 MIL/uL — ABNORMAL LOW (ref 4.22–5.81)
RDW: 14.6 % (ref 11.5–15.5)
WBC: 10.2 10*3/uL (ref 4.0–10.5)

## 2015-01-15 LAB — RENAL FUNCTION PANEL
ALBUMIN: 1.9 g/dL — AB (ref 3.5–5.0)
ANION GAP: 12 (ref 5–15)
BUN: 58 mg/dL — ABNORMAL HIGH (ref 6–20)
CALCIUM: 8 mg/dL — AB (ref 8.9–10.3)
CO2: 22 mmol/L (ref 22–32)
CREATININE: 6.5 mg/dL — AB (ref 0.61–1.24)
Chloride: 103 mmol/L (ref 101–111)
GFR calc non Af Amer: 8 mL/min — ABNORMAL LOW (ref >60)
GFR, EST AFRICAN AMERICAN: 10 mL/min — AB (ref >60)
Glucose, Bld: 79 mg/dL (ref 65–99)
PHOSPHORUS: 4.3 mg/dL (ref 2.5–4.6)
Potassium: 3.9 mmol/L (ref 3.5–5.1)
SODIUM: 137 mmol/L (ref 135–145)

## 2015-01-15 LAB — PHOSPHORUS: PHOSPHORUS: 4.3 mg/dL (ref 2.5–4.6)

## 2015-01-15 LAB — CK TOTAL AND CKMB (NOT AT ARMC)
CK TOTAL: 717 U/L — AB (ref 49–397)
CK, MB: 11.9 ng/mL — AB (ref 0.5–5.0)
Relative Index: 1.7 (ref 0.0–2.5)

## 2015-01-15 LAB — GLUCOSE, CAPILLARY
GLUCOSE-CAPILLARY: 83 mg/dL (ref 65–99)
GLUCOSE-CAPILLARY: 79 mg/dL (ref 65–99)
GLUCOSE-CAPILLARY: 90 mg/dL (ref 65–99)
GLUCOSE-CAPILLARY: 82 mg/dL (ref 65–99)
GLUCOSE-CAPILLARY: 76 mg/dL (ref 65–99)
Glucose-Capillary: 89 mg/dL (ref 65–99)

## 2015-01-15 LAB — PREALBUMIN: PREALBUMIN: 12.8 mg/dL — AB (ref 18–38)

## 2015-01-15 LAB — LACTIC ACID, PLASMA: LACTIC ACID, VENOUS: 0.8 mmol/L (ref 0.5–2.0)

## 2015-01-15 LAB — ANTI-SCLERODERMA ANTIBODY: Scleroderma (Scl-70) (ENA) Antibody, IgG: <0.2 AI (ref 0.0–0.9)

## 2015-01-15 LAB — MPO/PR-3 (ANCA) ANTIBODIES

## 2015-01-15 LAB — TROPONIN I: Troponin I: 0.99 ng/mL (ref <0.031)

## 2015-01-15 LAB — RHEUMATOID FACTOR

## 2015-01-15 LAB — HEPARIN LEVEL (UNFRACTIONATED): Heparin Unfractionated: <0.1 IU/mL — ABNORMAL LOW (ref 0.30–0.70)

## 2015-01-15 LAB — MAGNESIUM: Magnesium: 2.9 mg/dL — ABNORMAL HIGH (ref 1.7–2.4)

## 2015-01-15 LAB — ANTI-DNA ANTIBODY, DOUBLE-STRANDED: ds DNA Ab: <1 IU/mL (ref 0–9)

## 2015-01-15 MED ORDER — HEPARIN (PORCINE) IN NACL 100-0.45 UNIT/ML-% IJ SOLN
2150.0000 [IU]/h | INTRAMUSCULAR | Status: DC
  Administered 2015-01-15: 1750 [IU]/h via INTRAVENOUS
  Administered 2015-01-16: 2150 [IU]/h via INTRAVENOUS
  Filled 2015-01-15 (×3): qty 250

## 2015-01-15 MED ORDER — MIDAZOLAM HCL 5 MG/ML IJ SOLN
0.0000 mg/h | INTRAMUSCULAR | Status: DC
  Administered 2015-01-15: 2 mg/h via INTRAVENOUS
  Administered 2015-01-15: 8 mg/h via INTRAVENOUS
  Administered 2015-01-16 (×3): 10 mg/h via INTRAVENOUS
  Filled 2015-01-15 (×5): qty 10

## 2015-01-15 MED ORDER — TRACE MINERALS CR-CU-MN-SE-ZN 10-1000-500-60 MCG/ML IV SOLN
INTRAVENOUS | Status: AC
  Administered 2015-01-15: 18:00:00 via INTRAVENOUS
  Filled 2015-01-15: qty 960

## 2015-01-15 MED ORDER — MIDAZOLAM BOLUS VIA INFUSION
1.0000 mg | INTRAVENOUS | Status: DC | PRN
  Administered 2015-01-15 – 2015-01-16 (×4): 2 mg via INTRAVENOUS
  Filled 2015-01-15 (×5): qty 2

## 2015-01-15 MED ORDER — HYDROMORPHONE BOLUS VIA INFUSION
0.5000 mg | INTRAVENOUS | Status: DC | PRN
  Administered 2015-01-15 (×2): 0.5 mg via INTRAVENOUS
  Administered 2015-01-16 – 2015-01-17 (×7): 1 mg via INTRAVENOUS
  Filled 2015-01-15 (×10): qty 1

## 2015-01-15 MED ORDER — PIPERACILLIN-TAZOBACTAM IN DEX 2-0.25 GM/50ML IV SOLN
2.2500 g | Freq: Three times a day (TID) | INTRAVENOUS | Status: DC
  Administered 2015-01-15 – 2015-01-17 (×5): 2.25 g via INTRAVENOUS
  Filled 2015-01-15 (×7): qty 50

## 2015-01-15 NOTE — Progress Notes (Signed)
PARENTERAL NUTRITION CONSULT NOTE - INITIAL  Pharmacy Consult for TPN Indication: Prolonged Ileus  Allergies  Allergen Reactions  . Morphine And Related Anaphylaxis and Shortness Of Breath  . Montelukast Other (See Comments)    unknown  . Neurontin [Gabapentin] Other (See Comments)    delusional  . Wellbutrin [Bupropion] Palpitations    Insomnia    Patient Measurements: Weight: 285 lb 4.4 oz (129.4 kg) Usual Weight: 138 kg on admission (8/20)  Vital Signs: Temp: 99.3 F (37.4 C) (08/25 0810) Temp Source: Oral (08/25 0810) BP: 125/67 mmHg (08/25 0824) Pulse Rate: 73 (08/25 0824) Intake/Output from previous day: 08/24 0701 - 08/25 0700 In: 1195 [I.V.:955; NG/GT:40; IV Piggyback:200] Out: 3393 [Urine:48; Emesis/NG output:800] Intake/Output from this shift:    Labs:  Recent Labs  01/13/15 0001 01/13/15 0509 01/13/15 1730 01/14/15 0328 01/15/15 0442  WBC  --  11.1*  --  10.9* 10.2  HGB  --  13.6  --  12.7* 11.7*  HCT  --  39.1  --  36.1* 33.9*  PLT  --  96*  --  86* 90*  APTT 60*  --  51* 59*  --      Recent Labs  01/13/15 0509  01/14/15 0328 01/14/15 1600 01/15/15 0442  NA 137  < > 134* 134* 136  137  K 4.1  < > 3.9 4.0 3.9  3.9  CL 102  < > 101 100* 102  103  CO2 22  < > 23 21* 21*  22  GLUCOSE 114*  < > 75 88 81  79  BUN 54*  < > 56* 54* 58*  58*  CREATININE 6.72*  < > 6.56* 6.00* 6.51*  6.50*  CALCIUM 7.6*  < > 7.8* 7.8* 7.9*  8.0*  MG 2.5*  --  2.6*  --  2.9*  PHOS 4.6  < > 4.0 3.8 4.3  4.3  PROT  --   --   --   --  4.9*  ALBUMIN 2.1*  < > 2.0* 2.0* 2.0*  1.9*  AST  --   --   --   --  93*  ALT  --   --   --   --  573*  ALKPHOS  --   --   --   --  65  BILITOT  --   --   --   --  5.3*  PREALBUMIN  --   --   --   --  12.8*  TRIG  --   --  380*  --   --   < > = values in this interval not displayed. Estimated Creatinine Clearance: 17.2 mL/min (by C-G formula based on Cr of 6.5).    Recent Labs  01/14/15 1957 01/15/15 0106  01/15/15 0808  GLUCAP 76 83 90    Medical History: Past Medical History  Diagnosis Date  . Chest pain   . HTN (hypertension)   . Hyperlipemia   . DJD (degenerative joint disease)   . BPH (benign prostatic hypertrophy)   . Obesity   . OSA (obstructive sleep apnea)   . Hypercholesteremia   . Chronic back pain   . Anxiety   . Depression     Medications:  Scheduled:  . antiseptic oral rinse  7 mL Mouth Rinse QID  . aspirin  300 mg Rectal Daily  . chlorhexidine gluconate  15 mL Mouth Rinse BID  . insulin aspart  0-9 Units Subcutaneous 6 times per day  .  pantoprazole (PROTONIX) IV  40 mg Intravenous QHS  . piperacillin-tazobactam (ZOSYN)  IV  2.25 g Intravenous 4 times per day  . sodium chloride  10 mL Intravenous Q12H    Insulin Requirements in the past 24 hours:  None  Current Nutrition:  NPO Propofol @ 63mcg/kg/min (~600 kcal per day)  Assessment: 59 yo M presents on 8/18 after being found down at home. Was responsive to Narcan and then found to be hypotensive with elevated WBC count in ED. Went to surgery on 8/18 for ex lap but everything looked ok. Being treated for acute resp failure, RV failure with presumed PE,  ATN with metabolic acidosis. Now day #7 post-op with prolonged ileus. Pharmacy consulted to start TPN.  Surgeries/Procedures: Ex lap on 8/18 where no ischemic bowel or perforation was found. 8/21 went back to OR for closure of abdominal fascia and placement of wound VAC.  GI: Prolonged Ileus. Surgery would like to start TFs soon but still having high output from NGT ( yesterday). PPI  Endo: CBGs controlled (70-80s) SSI  Lytes: WNL. Phos 4.3, Mg 2.9. (Goal K > 4.0 and Mg > 2.0)   Renal: Was on CRRT but stopped 8/24. Renal plans for iHD prn with plans to give first session on 8/26. UOP < 50ml yesterday.  Pulm: Intubated 8/18. FiO2 at 40%.  Cards: RV failure with presumed PE. 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 Now stopped due to  bleeding. CT on hold due to concerns for renal recovery. HIT panel negative. Hgb 11.7, plts low at 90. aspirin  Hepatobil: LFTs elevated but trending down. Albumin low at 1.9. Prealbumin low at 12.8. Tbili increasing to 5.3 (no jaundice)  Neuro: propofol @ 30 mcg/kg/min (Trig elevated at 380 on 8/24), dilaudid @ 4,g/hr  ID: Sepsis with presumed abdmonial source. Afebrile, WBC wnl.  8/18 Blood cx > 8/18 Urine cx >  8/18 Vancomycin >> 8/23 8/18 Zosyn >>  Best Practices: SCDs  TPN Access: 8/20 - CVC triple lumen  TPN start date: 8/25 >>  Nutritional Goals: per RD on 8/19 KCal: 1610-9604 Protein: 175 g Fluid: 2 L  Plan:  Inititate Clinimix 5/15 (w/o electrolytes) at 22ml/hr (This will provide 48g of protein and 682 kcal) Propofol @ 30 mcg/kg/min. (This provides ~600 kcal) TPN + Propofol provide 27% of protein and 85% of kcal needs Hold IVFE for first 7 days for ICU patients per ASPEN guidelines (Day #1) Continue MVI and trace elements in TPN (monitor for jaundice) Continue sensitive SSI and adjust as needed Monitor TPN labs, renal function panel, Tbili, Trig  Jared [name] J 01/15/2015,9:50 AM

## 2015-01-15 NOTE — Progress Notes (Signed)
Utilization review completed.  

## 2015-01-15 NOTE — Progress Notes (Signed)
CRITICAL VALUE ALERT  Critical value received:  Troponin 0.99  Date of notification:  01/15/2015  Time of notification:  06:05  Critical value read back:Yes.    Nurse who received alert:  Ron Agee  MD notified (1st page):  Joneen Roach, NP  Time of first page:  06:06  MD notified (2nd page):  Time of second page:  Responding MD:  Joneen Roach, NP  Time MD responded:  06:06

## 2015-01-15 NOTE — Progress Notes (Signed)
PULMONARY / CRITICAL CARE MEDICINE   Name: Jared [name] MRN: [id number] DOB: April 30, 1956    ADMISSION DATE:  01/08/2015 CONSULTATION DATE:  8/18  REFERRING MD :  EDP Dr Criss Alvine  CHIEF COMPLAINT:  Found down  INITIAL PRESENTATION:   59 yo male had severe abdominal pain, and then found unresponsive at home.  In ER he was in shock, cyanotic, with lactic acidosis, and hyperkalemia.  STUDIES:  8/18 Echo >> EF 45 to 50%, mod RV dilation, mod/severe RV dysfx, PAS 47 mmHg 8/18 Renal u/s >> b/l renal echogenicity  SIGNIFICANT EVENTS: 8/18 Admit, CCS, cardiology consulted; brief PEA arrest; laparotomy with placement of wound vac; started heparin gtt for presumed PE 8/19 Renal consulted. Duplex LE negative for DVT 8/20 CRRT  8/21 OR for abd wound closure    8/21 :  To OR this am with abd wound closure , did well . CRRT started yesterday , no change in scr or UOP.  Bradycardia overnight with Amio stopped. HR improved but remains in upper 40s. Remains on pressors with Levo and Vaso , decreased demands    01/12/15; For cath later 01/12/2015 per Dr Jomarie Longs. On pressors. Anuric. On CRRT. Off pressors. Improved Shock Liver. On fent gtt and heparing gtt. 40% fio2   01/13/15: Cath delayed to 01/13/2015. Off pressors. On CRRT. On fent gtt . Off heparin gtt -> due to dropping platelet. HITT panel sent. RN reports significant abd tenderness. CCS reports patient has ileys    01/14/15: CCS reports ongoing ileus but planning TF in 1-2days. Offpressors. Ongong CRRT. Cath relatively clean except occlusion of OM branch - not felt to be primary cause of current illness. This is per renal note. No cath note seen in EMR.  On dilaudid and diprivan gtt for sedation.    Primarhy dx stll unclear and is on empiric Bivalirudin (angiomax) for PE - HITT panel pending. DUplex LE - repeat negative for DVT . RN reports bleeding around oral cavity. Platelets at 86K    SUBJECTIVE/OVERNIGHT/INTERVAL HX 01/15/15 - ANgiomax  on hold x 24h due to oral bleeding. 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 Platelt around 90K. TNA being initiated 01/15/2015 due to ileus. Anuria with minimal urine otuput. CTA did not happen yesterday due to PIV issue. Today d/w renal - risk of long term anticoagulation for empiric PE better than long term HD. SEdated with diprivan and dilaudid 4mg   NOTED: CXR reports he is extubated but there are no bedside issues on vent  VITAL SIGNS: Temp:  [98.2 F (36.8 C)-99.3 F (37.4 C)] 99.3 F (37.4 C) (08/25 0810) Pulse Rate:  [34-81] 74 (08/25 1122) Resp:  [10-25] 10 (08/25 1122) BP: (94-140)/(53-74) 128/60 mmHg (08/25 1000) SpO2:  [82 %-100 %] 98 % (08/25 1122) Arterial Line BP: (87-166)/(40-85) 166/85 mmHg (08/25 1100) FiO2 (%):  [40 %] 40 % (08/25 1122) Weight:  [129.4 kg (285 lb 4.4 oz)] 129.4 kg (285 lb 4.4 oz) (08/25 0500) HEMODYNAMICS:   VENTILATOR SETTINGS: Vent Mode:  [-] PSV FiO2 (%):  [40 %] 40 % Set Rate:  [25 bmp] 25 bmp Vt Set:  [670 mL] 670 mL PEEP:  [5 cmH20] 5 cmH20 Pressure Support:  [10 cmH20] 10 cmH20 Plateau Pressure:  [22 cmH20-24 cmH20] 22 cmH20 INTAKE / OUTPUT:  Intake/Output Summary (Last 24 hours) at 01/15/15 1142 Last data filed at 01/15/15 1100  Gross per 24 hour  Intake 1115.08 ml  Output   2814 ml  Net -1698.92 ml    Total I/O In:  168 [I.V.:168] Out: 110 [Urine:10; Emesis/NG output:100]  I/O last 3 completed shifts: In: 1982.2 [I.V.:1602.2; NG/GT:80; IV Piggyback:300] Out: 5326 [Urine:83; Emesis/NG output:1100; Other:4143]   PHYSICAL EXAMINATION: General: critically ill appearing on vent  Neuro: sedated moves extremities. RASS -3 on dilaudid and diprivan - followed commands per RN during WUA HEENT: ETT in place Cardiovascular: SB , no murmur Lungs: faint basilar crackles Abdomen: mild distention, wound vac in place, tenderness diffusely + but improved siginifcantly Musculoskeletal: no edema  LABS:  PULMONARY  Recent Labs Lab  01/09/15 0008 01/09/15 0410 01/09/15 1901 01/10/15 0408 01/10/15 1615 01/11/15 1131  PHART 7.390 7.437 7.339* 7.348*  --  7.439  PCO2ART 35.6 32.4* 45.3* 41.9  --  30.4*  PO2ART 118* 99.8 88.0 106*  --  79.8*  HCO3 21.0 21.7 24.0 22.2  --  20.3  TCO2 22.1 22.7 25 23.4  --  21.2  O2SAT 97.3 96.7 95.0 96.7 76.4 95.1    CBC  Recent Labs Lab 01/13/15 0509 01/14/15 0328 01/15/15 0442  HGB 13.6 12.7* 11.7*  HCT 39.1 36.1* 33.9*  WBC 11.1* 10.9* 10.2  PLT 96* 86* 90*    COAGULATION  Recent Labs Lab 01/08/15 1350 01/09/15 1630 01/11/15 0413 01/12/15 0801  INR 1.54* 2.24* 1.68* 1.34    CARDIAC    Recent Labs Lab 01/09/15 0914 01/09/15 1512 01/09/15 2129 01/11/15 1215 01/15/15 0442  TROPONINI >65.00* >65.00* 49.60* 4.71* 0.99*   No results for input(s): PROBNP in the last 168 hours.   CHEMISTRY  Recent Labs Lab 01/11/15 0413 01/11/15 1215  01/13/15 0509 01/13/15 1550 01/14/15 0328 01/14/15 1600 01/15/15 0442  NA 133* 134*  < > 137 136 134* 134* 136  137  K 3.2* 3.4*  < > 4.1 4.1 3.9 4.0 3.9  3.9  CL 98* 99*  < > 102 104 101 100* 102  103  CO2 20* 20*  < > 22 22 23  21* 21*  22  GLUCOSE 143* 119*  < > 114* 77 75 88 81  79  BUN 67* 65*  < > 54* 58* 56* 54* 58*  58*  CREATININE 8.43* 8.55*  < > 6.72* 6.93* 6.56* 6.00* 6.51*  6.50*  CALCIUM 6.7* 6.7*  < > 7.6* 7.5* 7.8* 7.8* 7.9*  8.0*  MG 2.4 2.4  --  2.5*  --  2.6*  --  2.9*  PHOS 3.4 4.3  < > 4.6 4.0 4.0 3.8 4.3  4.3  < > = values in this interval not displayed. Estimated Creatinine Clearance: 17.2 mL/min (by C-G formula based on Cr of 6.5).   LIVER  Recent Labs Lab 01/08/15 1350  01/09/15 1630 01/10/15 0330 01/11/15 0413 01/12/15 0414 01/12/15 0801  01/13/15 0509 01/13/15 1550 01/14/15 0328 01/14/15 1600 01/15/15 0442  AST  --   --  4557* 2907* 1601* 686*  --   --   --   --   --   --  93*  ALT  --   --  4100* 3366* 2981* 2171*  --   --   --   --   --   --  573*  ALKPHOS  --    --  83 74 82 79  --   --   --   --   --   --  65  BILITOT  --   --  1.7* 1.7* 2.5* 3.5*  --   --   --   --   --   --  5.3*  PROT  --   --  4.1* 3.7* 3.9* 4.3*  --   --   --   --   --   --  4.9*  ALBUMIN  --   < > 2.4* 2.1* 2.0* 2.0*  --   < > 2.1* 1.9* 2.0* 2.0* 2.0*  1.9*  INR 1.54*  --  2.24*  --  1.68*  --  1.34  --   --   --   --   --   --   < > = values in this interval not displayed.   INFECTIOUS  Recent Labs Lab 01/09/15 0945 01/09/15 1630 01/10/15 0330 01/11/15 0413 01/15/15 0145  LATICACIDVEN  --  1.7 1.5  --  0.8  PROCALCITON 40.68  --  37.87 25.12  --      ENDOCRINE CBG (last 3)   Recent Labs  01/14/15 1957 01/15/15 0106 01/15/15 0808  GLUCAP 76 83 90         IMAGING x48h  - image(s) personally visualized  -   highlighted in bold Dg Chest Port 1 View  01/15/2015   CLINICAL DATA:  Extubation.  EXAM: PORTABLE CHEST - 1 VIEW  COMPARISON:  01/13/2015.  FINDINGS: Interim extubation and removal of NG tube and left IJ tube. Right IJ line noted with tip at cavoatrial junction. Cardiomegaly with continued improvement of pulmonary venous congestion and interstitial edema. Interval resolution of pleural effusions. No pneumothorax. Linear density along the right chest most likely represents a skin fold as lung markings are noted distal to this linear density. Mild gastric distention.  IMPRESSION: 1. Interim extubation and removal of NG tube and left IJ line. Right IJ line tip noted at cavoatrial junction. 2. Cardiomegaly with continued improvement of pulmonary vascular congestion and interstitial edema. Interim resolution of pleural effusions. 3. Gastric distention.   Electronically Signed   By: Maisie Fus  Register   On: 01/15/2015 07:02         ASSESSMENT / PLAN:  PULMONARY ETT 8/18 >>> A: Acute respiratory failure 2nd to presumed PE, lactic acidosis. Hx of OSA    - does not meet sbt criteria 01/15/2015. ? ET tube position high or false CXR  - HITT Ab negative  01/13/15. Anigomax on hold du to oral bleeding that resolved   Duplex LE negative for DVT 01/13/15  P:   Restart IV heparin gtt Cancel CTA chest - better risk proile with 1-2 year anticoagulation than higher risk of permanent  HD (wife says HD would be a nightmare for patient pschye) Full vent support VAP precautions     CARDIOVASCULAR Rt femoral CVL 8/18 >>8/20 Lt femoral A line 8/18 >> RIJ HD 8/20 >> LIJ 8/20 >>  A: Shock >> likely cardiogenic. RV failure with possible PE.-although venous doppler neg for DVT ? MI related  Hx of HTN, HLD. Atrial fib  Probable Acute MI (w/ ST elevation /elevation troponin )     - Off amio ? Date. Off pressors since 01/12/2015 AM.  Cath 8/06/25/14 OM branch occlusion per report.   P:  Hold outpt lipitor, bisoprolol, HCTZ, losartan, minoxidil  RENAL  #Baseline CKD>> baseline creatinine 1.14 from 12/17/14., BPH   #Admit   - ATN with metab acidosis and hyperkalemia - renal US neg  - CRRT started per renal 8/20  - stopped 01/15/15 and monitoring    -  Making very little urine. RF negative 01/14/15  P:   Await  auotimmune panel 01/14/15 Rest per renal  GASTROINTESTINAL A: Acute abdominal pain s/p laparotomy with wound vac 8/18.>wound vac change 8/19 d/t eviceration>to OR 8/21 with wound closure  Shock Liver >LFT tr down   - ABd tenderness + but decreased. 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      ->Now w/ wound vac will need to cont abx for now . Follow Cx and PCT ,    P:   Day 7 vanc completed 01/14/15 Day 8  Vancomycin to continue   ENDOCRINE A: Hyperglycemia  P:   SSI   NEUROLOGIC A: Acute metabolic encephalopathy >> improved 8/18.>follows commands off sedation  Hx of chronic back pain, anxiety, depression.   - on sedation gtt but has sginificant abdominal pain improved and follows commands  P:   RASS goal: -1  Wean sedation as able  Dilaudid gtt - ioncrease dose range DC Diprivan gtt (starting TNA) STart versed gtt Use versed prn Hold outpt xanax, celexa, oxycodone, lyrica   FAMILY Updates - wife updated at bedside 01/12/2015 with presence of RN Darel Hong  IDT meet: due on LOS 7 days which is 01/16/15 - done 01/15/15 with RN Donah Driver at bedside. Full code. TRach./LTAC likely needed. THye are ok wih it. Wife understands that this will be a high mort situation and long drawn out course    SUMMARY  = no clear etioogy for presentation. Sepsis and PE likely causes. On angiomax but having oral bleding so stopping. On CRRT. Will ask renal about getting CTA rule out pe depending on renal prognosis - if no PE then no need for anticoagulation. Checking autoimmune    The patient is critically ill with multiple organ systems failure and requires high complexity decision making for assessment and support, frequent evaluation and titration of therapies, application of advanced monitoring technologies and extensive interpretation of multiple databases.   Critical Care Time devoted to patient care services described in this note is  35  Minutes. This time reflects time of care of this signee Dr Kalman Shan. This critical care time does not reflect procedure time, or teaching time or supervisory time of PA/NP/Med student/Med Resident etc but could involve care discussion time    Dr. Kalman Shan, M.D., [facility].C.P Pulmonary and Critical Care Medicine Staff  Physician [facility] [place] [facility] Pager: [phone], If no answer or between  15:00h - 7:00h: call [phone]  01/15/2015 11:42 AM

## 2015-01-15 NOTE — Progress Notes (Addendum)
ANTICOAGULATION CONSULT NOTE - Initial Consult  Pharmacy Consult for Heparin Indication: pulmonary embolus  Allergies  Allergen Reactions  . Morphine And Related Anaphylaxis and Shortness Of Breath  . Montelukast Other (See Comments)    unknown  . Neurontin [Gabapentin] Other (See Comments)    delusional  . Wellbutrin [Bupropion] Palpitations    Insomnia    Patient Measurements: Weight: 285 lb 4.4 oz (129.4 kg) Heparin Dosing Weight: 111.75 kg  Vital Signs: Temp: 98.2 F (36.8 C) (08/25 1144) Temp Source: Oral (08/25 1144) BP: 128/60 mmHg (08/25 1000) Pulse Rate: 74 (08/25 1122)  Labs:  Recent Labs  01/12/15 1712 01/13/15 0001  01/13/15 0509  01/13/15 1730 01/14/15 0328 01/14/15 1600 01/15/15 0442  HGB  --   --   < > 13.6  --   --  12.7*  --  11.7*  HCT  --   --   --  39.1  --   --  36.1*  --  33.9*  PLT  --   --   --  96*  --   --  86*  --  90*  APTT  --  60*  --   --   --  51* 59*  --   --   HEPARINUNFRC 0.39  --   --  0.32  --   --   --   --   --   CREATININE  --   --   --  6.72*  < >  --  6.56* 6.00* 6.51*  6.50*  CKTOTAL  --   --   --   --   --   --   --   --  717*  CKMB  --   --   --   --   --   --   --   --  11.9*  TROPONINI  --   --   --   --   --   --   --   --  0.99*  < > = values in this interval not displayed.  Estimated Creatinine Clearance: 17.2 mL/min (by C-G formula based on Cr of 6.5).   Medical History: Past Medical History  Diagnosis Date  . Chest pain   . HTN (hypertension)   . Hyperlipemia   . DJD (degenerative joint disease)   . BPH (benign prostatic hypertrophy)   . Obesity   . OSA (obstructive sleep apnea)   . Hypercholesteremia   . Chronic back pain   . Anxiety   . Depression     Medications:  Prescriptions prior to admission  Medication Sig Dispense Refill Last Dose  . albuterol (PROVENTIL HFA;VENTOLIN HFA) 108 (90 BASE) MCG/ACT inhaler Inhale 2 puffs into the lungs every 2 (two) hours as needed for wheezing or  shortness of breath (cough). 1 Inhaler 2 2 weeks  . ALPRAZolam (XANAX) 1 MG tablet TAKE 1/2-1 TABLET BY MOUTH 3 TIMES DAILY 90 tablet 1 01/07/2015 at Unknown time  . atorvastatin (LIPITOR) 40 MG tablet TAKE 1 TABLET BY MOUTH DAILY OR AS DIRECTED FOR CHOLESTEROL (Patient taking differently: TAKE 1 TABLET BY MOUTH every evening OR AS DIRECTED FOR CHOLESTEROL) 90 tablet 3 01/07/2015 at Unknown time  . bisoprolol-hydrochlorothiazide (ZIAC) 10-6.25 MG per tablet Take 1 tablet by mouth daily.   01/07/2015 at Unknown time  . Cholecalciferol (VITAMIN D-3) 5000 UNITS TABS Take 2,000 Units by mouth daily.    01/07/2015 at Unknown time  . citalopram (CELEXA) 40 MG tablet TAKE 1 TABLET  BY MOUTH EVERY DAY FOR MOOD 90 tablet 0 01/07/2015 at Unknown time  . docusate sodium (COLACE) 100 MG capsule Take 100 mg by mouth daily as needed for mild constipation.    01/05/2015 at Unknown time  . Flaxseed, Linseed, (FLAXSEED OIL PO) Take 1 tablet by mouth daily.     01/07/2015 at Unknown time  . fluticasone (FLONASE) 50 MCG/ACT nasal spray USE 1 TO 2 SPRAYS IN EACHNOSTRIL ONCE DAILY (Patient taking differently: USE 1 TO 2 SPRAYS IN EACHNOSTRIL ONCE DAILY as needed) 16 g 99 Past Month at Unknown time  . losartan-hydrochlorothiazide (HYZAAR) 100-25 MG per tablet TAKE 1 TABLET BY MOUTH EVERY DAY 90 tablet 0 01/07/2015 at Unknown time  . minoxidil (LONITEN) 10 MG tablet Take 1 tablet daily or as directed for BP (Patient taking differently: Take 5 mg by mouth at bedtime. ) 90 tablet 99 01/07/2015 at Unknown time  . Omega-3 Fatty Acids (FISH OIL BURP-LESS PO) Take 1 tablet by mouth daily.     01/07/2015 at Unknown time  . oxycodone (ROXICODONE) 30 MG immediate release tablet Take 60 mg by mouth every 4 (four) hours as needed for pain.    01/07/2015 at Unknown time  . pregabalin (LYRICA) 75 MG capsule Take 1 to 2 capsules 1 hour before sleep 14 capsule 0 01/07/2015 at Unknown time  . senna (SENOKOT) 8.6 MG TABS tablet Take 4 tablets by mouth at  bedtime.   01/07/2015 at Unknown time  . azelastine (ASTELIN) 0.1 % nasal spray 1 to 2 sprays each nostril every 12 hours (Patient taking differently: Place 1-2 sprays into both nostrils 2 (two) times daily as needed for allergies. 1 to 2 sprays each nostril every 12 hours) 30 mL 99 Past Week at Unknown time  . ondansetron (ZOFRAN) 8 MG tablet 1/2 to 1 tablet up to 3 x daily as needed for nausea. 30 tablet 3 01/03/2015  . testosterone cypionate (DEPOTESTOTERONE CYPIONATE) 200 MG/ML injection Inject 2 cc IM every 2 weeks or as directed by a physician. (Patient taking differently: Inject into the muscle every 28 (twenty-eight) days. Inject 2 cc IM every monthly or as directed by a physician.) 10 mL 2 2 weeks  . [DISCONTINUED] pregabalin (LYRICA) 50 MG capsule Take 1 to 3 capsules 1 hour before sleep (Patient not taking: Reported on 01/08/2015) 21 capsule 0 Not Taking at Unknown time    Assessment: 59 y.o. male who presented with shock d/t abdominal source, now s/p abdominal closure w/ wound VAC. Pt also had elevated cardiac markers and evidence of R heart strain on ECHO, which was concerning for a possible PE. A CT angio has been unable to be performed d/t the pt's decline in renal fxn and inability to get proper access, so heparin was initiated for empiric treatment of a PE. The pt's renal fxn further declined and he was started on CRRT. He has now transitioned to IHD and is scheduled to receive dialysis on 8/26.  On 8/23, Heparin was stopped for a CATH procedure and concern of potential HIT d/t a decrease in platelets from 186 to 96 over 5 days. Potential other causes of thrombocytopenia include multiple wound VAC changes and consumption d/t CRRT machine. He was then initiated on bivaliruidin. The bivalirudin was stopped on 8/24 at noon d/t bleeding from the pt's mouth. The plan was for the pt to receive a CT angio to determine if he needs [facility] for a potential PE, but proper access was not able to be obtained.  The HIT AB has come back negative (0.212), so the patient will be re-initiated on heparin to empirically treat his potential PE.  Plt 90, Hgb 11.7 (slight drop from 12.7).  Today pt has some mild oozing from his abdomen wound, but no other bleeding from his foley/rectum/mouth.  Goal of Therapy:  Heparin level 0.3-0.7 units/ml Monitor platelets by anticoagulation protocol: Yes   Plan:  -Initiate heparin IV at 1750 units/hr (no bolus) -Check 6 hr HL -Daily CBC/HL -Monitor closely for bleeding; pt had oropharynx bleeding on 8/24 and also has an open abdomen -Follow platelets  Arcola Jansky, PharmD Clinical Pharmacy Resident Pager: [phone] 01/15/2015,12:31 PM  _________________________________________________________ ADDENDUM: Heparin level is subtherapeutic at < 0.1 units/mL while running at 1750 units/hr.  Spoke w/ RN, heparin still infusing and pt not experiencing any additional bleeding complications at this time.  Goal of Therapy:  Heparin level 0.3-0.7 units/ml Monitor platelets by anticoagulation protocol: Yes   Plan:  -Increase heparin IV to 2150 units/hr (no bolus) -Check 6 hr HL -Daily HL, CBC, closely monitor PLT -Monitor closely for bleeding; pt had oropharynx bleeding on 8/24 and also has an open abdomen  Waynette Buttery, PharmD Clinical Pharmacist Pager: [phone] 01/15/2015 7:45 PM

## 2015-01-15 NOTE — Progress Notes (Signed)
ETT advanced to 29 at lip per MD order.  Repeat xray ordered.

## 2015-01-15 NOTE — Progress Notes (Signed)
Patient Name: Jared [name] Date of Encounter: 01/15/2015 Active Problems:   Septic shock   Acute respiratory failure with hypoxia   Shock   Acute respiratory failure with hypoxemia   Cardiogenic shock   AKI (acute kidney injury)   Renal failure   NSTEMI (non-ST elevated myocardial infarction)  Length of Stay: 8  SUBJECTIVE  Sedated and intubated.  Switched to argatroban due to concern for HIT, but stopped due to bleeding. - On IV heparin per pharmacy for pulmonary embolus.  CURRENT MEDS . antiseptic oral rinse  7 mL Mouth Rinse QID  . aspirin  300 mg Rectal Daily  . chlorhexidine gluconate  15 mL Mouth Rinse BID  . insulin aspart  0-9 Units Subcutaneous 6 times per day  . pantoprazole (PROTONIX) IV  40 mg Intravenous QHS  . piperacillin-tazobactam (ZOSYN)  IV  2.25 g Intravenous 4 times per day  . sodium chloride  10 mL Intravenous Q12H    OBJECTIVE  Filed Vitals:   01/15/15 1000 01/15/15 1100 01/15/15 1122 01/15/15 1144  BP: 128/60     Pulse: 78 34 74   Temp:    98.2 F (36.8 C)  TempSrc:    Oral  Resp: 12 14 10    Weight:      SpO2: 97% 82% 98%     Intake/Output Summary (Last 24 hours) at 01/15/15 1313 Last data filed at 01/15/15 1100  Gross per 24 hour  Intake 1026.21 ml  Output   2445 ml  Net -1418.79 ml   Filed Weights   01/13/15 0400 01/14/15 0500 01/15/15 0500  Weight: 294 lb 8.6 oz (133.6 kg) 294 lb 5 oz (133.5 kg) 285 lb 4.4 oz (129.4 kg)    PHYSICAL EXAM  General: Intubated and sedated.  Neuro: Moves all extremities spontaneously. Psych: unable to assess HEENT: ETT tube  Neck: Supple without bruits or JVD. Lungs:  Resp regular and unlabored, CTA. Heart: RRR no s3, s4, or murmurs. Abdomen: Soft, non-tender, non-distended, BS + x 4.  Extremities: No clubbing, cyanosis or edema. DP/PT/Radials 2+ and equal bilaterally.  Accessory Clinical Findings  CBC  Recent Labs  01/14/15 0328 01/15/15 0442  WBC 10.9* 10.2  NEUTROABS  --  8.3*    HGB 12.7* 11.7*  HCT 36.1* 33.9*  MCV 87.0 88.7  PLT 86* 90*   Basic Metabolic Panel  Recent Labs  01/14/15 0328 01/14/15 1600 01/15/15 0442  NA 134* 134* 136  137  K 3.9 4.0 3.9  3.9  CL 101 100* 102  103  CO2 23 21* 21*  22  GLUCOSE 75 88 81  79  BUN 56* 54* 58*  58*  CREATININE 6.56* 6.00* 6.51*  6.50*  CALCIUM 7.8* 7.8* 7.9*  8.0*  MG 2.6*  --  2.9*  PHOS 4.0 3.8 4.3  4.3   Liver Function Tests  Recent Labs  01/14/15 1600 01/15/15 0442  AST  --  93*  ALT  --  573*  ALKPHOS  --  65  BILITOT  --  5.3*  PROT  --  4.9*  ALBUMIN 2.0* 2.0*  1.9*   No results for input(s): LIPASE, AMYLASE in the last 72 hours. Cardiac Enzymes  Recent Labs  01/15/15 0442  CKTOTAL 717*  CKMB 11.9*  TROPONINI 0.99*   Fasting Lipid Panel  Recent Labs  01/14/15 0328  TRIG 380*    TELE  NSR  Cath 01/13/15 Conclusion    1. Apparent occluded large branch of obtuse marginal. 2.  Right coronary artery appears patent but could only be visualized nonselectively. 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 3. Normal ascending aorta. 4. Normal LVEDP.  Continue supportive care for his shock physiology and respiratory failure. Left main, LAD were selectively engaged and appear patent. The RCA appears patent nonselectively. Ventriculogram not performed.     ASSESSMENT AND PLAN    (Cardiogenic?) shock - improved, off pressors. Cath showed occluded OM branch (culprit? Chronic?). RCA could not be selectively injected, but was seen to be patent. Possible pulmonary embolism, but note negative venous Dopplers.  NSTEMI with severe troponin elevation after resuscitation (global ischemia?). OM branch occlusion appears insufficient to cause such an elevation in troponin. Troponin of 0.99 today.   Bradycardia, resolved. Possibly due to amiodarone given for VT/VF  Acute renal failure on CRRT. Still essentially anuric. Planning to switch to intermittent HD. Plan  for HD tomorrow  S/P cardiorespiratory arrest - seems that a pulmonary event triggered this. Initially improved with narcan and BiPAP, developed ventricular arrhythmia after pressors were initiated for hypotension. May have subsequently had acute MI. Hard to put everything together.  Thrombocytopenia Slow decay in platelet count may be due to consumption in CRRT circuit, but raises concern for HIT. Argatroban stopped due to bleeding. Platelets improved slightly to 90 today. Plan to start IV heparin for PE, monitor closely.   Signed, Bhagat,Bhavinkumar PA-C   I have seen and examined the patient along with Bhagat,Bhavinkumar PA-C.  I have reviewed the chart, notes and new data.  I agree with PA's note.  Key new complaints: remains intubated and sedated, not on pressors Key examination changes: no active bleeding Key new findings / data: HIT -ve. Reviewed admission labs. Initial creatinine was 4.95, after being normal on 7/27, suggesting his renal failure began at least 3 days before.  PLAN: Continue supportive care, empirical therapy for possible pulmonary embolism. No new recommendations.  [name] Fair, MD, [facility] [phone] 01/15/2015, 3:33 PM

## 2015-01-15 NOTE — Progress Notes (Signed)
Patient ID: Jared [name], male   DOB: January 16, 1956, 59 y.o.   MRN: [id number] S:intubated and sedated.  Events noted O:BP 125/67 mmHg  Pulse 73  Temp(Src) 99.3 F (37.4 C) (Oral)  Resp 13  Wt 129.4 kg (285 lb 4.4 oz)  SpO2 96%  Intake/Output Summary (Last 24 hours) at 01/15/15 0841 Last data filed at 01/15/15 0700  Gross per 24 hour  Intake 1143.18 ml  Output   3174 ml  Net -2030.82 ml   Intake/Output: I/O last 3 completed shifts: In: 1982.2 [I.V.:1602.2; NG/GT:80; IV Piggyback:300] Out: 5326 [Urine:83; Emesis/NG output:1100; Other:4143]  Intake/Output this shift:    Weight change: -4.1 kg (-9 lb 0.6 oz) Gen:WD WM intubated  CVS:no rub Resp:scattered rhonchi Abd:+BS, distended Ext:tr edema   Recent Labs Lab 01/08/15 1100  01/09/15 1630 01/10/15 0330 01/11/15 0413 01/11/15 1215 01/12/15 0414 01/12/15 1515 01/13/15 0509 01/13/15 1550 01/14/15 0328 01/14/15 1600 01/15/15 0442  NA 133*  < > 137 135 133* 134* 137 137 137 136 134* 134* 136  137  K 6.4*  < > 3.6 3.8 3.2* 3.4* 4.0 4.3 4.1 4.1 3.9 4.0 3.9  3.9  CL 92*  < > 96* 96* 98* 99* 103 103 102 104 101 100* 102  103  CO2 20*  < > 24 21* 20* 20* 23 22 22 22 23  21* 21*  22  GLUCOSE 170*  < > 106* 127* 143* 119* 87 83 114* 77 75 88 81  79  BUN 44*  < > 61* 71* 67* 65* 59* 58* 54* 58* 56* 54* 58*  58*  CREATININE 4.95*  < > 7.58* 8.72* 8.43* 8.55* 8.03* 7.16* 6.72* 6.93* 6.56* 6.00* 6.51*  6.50*  ALBUMIN 4.1  --  2.4* 2.1* 2.0*  --  2.0* 2.0* 2.1* 1.9* 2.0* 2.0* 2.0*  1.9*  CALCIUM 8.8*  < > 7.1* 6.8* 6.7* 6.7* 7.0* 7.2* 7.6* 7.5* 7.8* 7.8* 7.9*  8.0*  PHOS  --   < >  --  4.6 3.4 4.3  --  4.2 4.6 4.0 4.0 3.8 4.3  4.3  AST 626*  --  4557* 2907* 1601*  --  686*  --   --   --   --   --  93*  ALT 729*  --  4100* 3366* 2981*  --  2171*  --   --   --   --   --  573*  < > = values in this interval not displayed. Liver Function Tests:  Recent Labs Lab 01/11/15 0413 01/12/15 0414  01/14/15 0328 01/14/15 1600  01/15/15 0442  AST 1601* 686*  --   --   --  93*  ALT 2981* 2171*  --   --   --  573*  ALKPHOS 82 79  --   --   --  65  BILITOT 2.5* 3.5*  --   --   --  5.3*  PROT 3.9* 4.3*  --   --   --  4.9*  ALBUMIN 2.0* 2.0*  < > 2.0* 2.0* 2.0*  1.9*  < > = values in this interval not displayed.  Recent Labs Lab 01/10/15 2342 01/12/15 0414  LIPASE 43 80*  AMYLASE 198* 218*   No results for input(s): AMMONIA in the last 168 hours. CBC:  Recent Labs Lab 01/08/15 1100  01/11/15 0413 01/12/15 0414 01/13/15 0509 01/14/15 0328 01/15/15 0442  WBC 20.5*  < > 19.9* 14.9* 11.1* 10.9* 10.2  NEUTROABS 19.2*  --   --   --   --   --  8.3*  HGB 17.1*  < > 14.5 14.0 13.6 12.7* 11.7*  HCT 48.8  < > 39.3 38.6* 39.1 36.1* 33.9*  MCV 89.9  < > 85.8 85.2 87.1 87.0 88.7  PLT 186  < > 121* 102* 96* 86* 90*  < > = values in this interval not displayed. Cardiac Enzymes:  Recent Labs Lab 01/09/15 0914 01/09/15 1512 01/09/15 2129 01/11/15 1215 01/15/15 0442  CKTOTAL  --   --   --   --  717*  CKMB  --   --   --   --  11.9*  TROPONINI >65.00* >65.00* 49.60* 4.71* 0.99*   CBG:  Recent Labs Lab 01/14/15 0752 01/14/15 1126 01/14/15 1547 01/14/15 1957 01/15/15 0106  GLUCAP 81 83 86 76 83    Iron Studies: No results for input(s): IRON, TIBC, TRANSFERRIN, FERRITIN in the last 72 hours. Studies/Results: Dg Chest Port 1 View  01/15/2015   CLINICAL DATA:  Extubation.  EXAM: PORTABLE CHEST - 1 VIEW  COMPARISON:  01/13/2015.  FINDINGS: Interim extubation and removal of NG tube and left IJ tube. Right IJ line noted with tip at cavoatrial junction. Cardiomegaly with continued improvement of pulmonary venous congestion and interstitial edema. Interval resolution of pleural effusions. No pneumothorax. Linear density along the right chest most likely represents a skin fold as lung markings are noted distal to this linear density. Mild gastric distention.  IMPRESSION: 1. Interim extubation and removal of NG  tube and left IJ line. Right IJ line tip noted at cavoatrial junction. 2. Cardiomegaly with continued improvement of pulmonary vascular congestion and interstitial edema. Interim resolution of pleural effusions. 3. Gastric distention.   Electronically Signed   By: Maisie Fus  Register   On: 01/15/2015 07:02   Dg Abd Portable 1v  01/13/2015   CLINICAL DATA:  Abdominal distension of uncertain etiology, septic shock, acute respiratory failure, acute renal injury.  EXAM: PORTABLE ABDOMEN - 1 VIEW  COMPARISON:  Portable abdominal film of January 09, 2015  FINDINGS: There is a relative paucity of bowel gas. Distended bowel loops are not observed. A nasogastric tube is in place with the tip in the region of the distal pylorus or proximal duodenum. There are no abnormal soft tissue calcifications. The patient has undergone a previous lumbosacral fusion posteriorly. The right-sided femoral catheter has been removed. The left-sided femoral catheter remains.  IMPRESSION: No acute intra-abdominal abnormality is demonstrated. The esophagogastric tube appears to be in appropriate position.   Electronically Signed   By: David  [place] M.D.   On: 01/13/2015 11:13   . antiseptic oral rinse  7 mL Mouth Rinse QID  . aspirin  300 mg Rectal Daily  . chlorhexidine gluconate  15 mL Mouth Rinse BID  . insulin aspart  0-9 Units Subcutaneous 6 times per day  . pantoprazole (PROTONIX) IV  40 mg Intravenous QHS  . piperacillin-tazobactam (ZOSYN)  IV  2.25 g Intravenous 4 times per day  . sodium chloride  10 mL Intravenous Q12H    BMET    Component Value Date/Time   NA 137 01/15/2015 0442   NA 136 01/15/2015 0442   K 3.9 01/15/2015 0442   K 3.9 01/15/2015 0442   CL 103 01/15/2015 0442   CL 102 01/15/2015 0442   CO2 22 01/15/2015 0442   CO2 21* 01/15/2015 0442   GLUCOSE 79 01/15/2015 0442   GLUCOSE 81 01/15/2015 0442   BUN 58* 01/15/2015 0442   BUN 58* 01/15/2015 0442   CREATININE 6.50* 01/15/2015 6962  CREATININE 6.51*  01/15/2015 0442   CREATININE 1.15 12/17/2014 1707   CALCIUM 8.0* 01/15/2015 0442   CALCIUM 7.9* 01/15/2015 0442   GFRNONAA 8* 01/15/2015 0442   GFRNONAA 8* 01/15/2015 0442   GFRNONAA 68 12/17/2014 1707   GFRAA 10* 01/15/2015 0442   GFRAA 10* 01/15/2015 0442   GFRAA 79 12/17/2014 1707   CBC    Component Value Date/Time   WBC 10.2 01/15/2015 0442   RBC 3.82* 01/15/2015 0442   HGB 11.7* 01/15/2015 0442   HCT 33.9* 01/15/2015 0442   PLT 90* 01/15/2015 0442   MCV 88.7 01/15/2015 0442   MCH 30.6 01/15/2015 0442   MCHC 34.5 01/15/2015 0442   RDW 14.6 01/15/2015 0442   LYMPHSABS 0.8 01/15/2015 0442   MONOABS 0.7 01/15/2015 0442   EOSABS 0.5 01/15/2015 0442   BASOSABS 0.0 01/15/2015 0442    Assessment/Plan:  1. AKI/CKD stage 2, oliguric presumably due to septic shock.  CVVHD stopped yesterday and will plan for IHD as needed 1. Plan for HD tomorrow 2. VDRF- per PCCM 3. SIRS- possible PE (RA and RV dilated, RV hypocontractile on ECHO).  CT angio on hold for now (concerns for renal recovery as well as IV access).  Currently off of pressors. 4. CAD- cath revealed occluded OM, cardiology following 5. Shock liver- starting to improve 6. S/p exploratory lap and fascia closure now s/p wound vac 7. Metabolic acidosis- improved with CVVHD 8. 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 9. Hypoalbuminemia- per primary svc  Shellia Hartl A

## 2015-01-15 NOTE — Care Management Note (Addendum)
Case Management Note  Patient Details  Name: Jared [name] MRN: [id number] Date of Birth: 06/10/1955  Subjective/Objective:    Pt found down at home by wife, admitted with septic shock, since admit s/p exp lap and on continued vent support                Action/Plan: PTA pt lived at home with spouse- NCM will follow for d/c needs and planning  Expected Discharge Date:                  Expected Discharge Plan:  Home/Self Care  In-House Referral:     Discharge planning Services  CM Consult  Post Acute Care Choice:    Choice offered to:     DME Arranged:    DME Agency:     HH Arranged:    HH Agency:     Status of Service:  In process, will continue to follow  Medicare Important Message Given:    Date Medicare IM Given:    Medicare IM give by:    Date Additional Medicare IM Given:    Additional Medicare Important Message give by:     If discussed at Long Length of Stay Meetings, dates discussed:  01/15/15  Additional Comments:  01/13/15- pt continues on full Vent support and CRRT,   Darrold Span, RN 01/15/2015, 10:11 AM

## 2015-01-15 NOTE — Progress Notes (Signed)
01/15/2015 00:30 CRRT filter clotted at 23:35, blood returned, and treatment ended. CCMD, Dr Deterding called and updated; Nephrology called, updated, and Dr Marisue Humble gave telephone order to leave off CRRT given plan to dc CRRT at 06:00 and attempt transition to IHD; also CT Angio w/ minimal contrast order on hold and deferred to Dr Arrie Aran for approval in a.m.

## 2015-01-15 NOTE — Progress Notes (Addendum)
Nutrition Follow-up / Consult  DOCUMENTATION CODES:   Obesity unspecified  INTERVENTION:    TPN dosing per Pharmacy. Will be very difficult to meet high protein needs without exceeding calorie needs due to propofol rate and premixed Clinimix solution.   NUTRITION DIAGNOSIS:   Inadequate oral intake related to inability to eat as evidenced by NPO status.  Ongoing  GOAL:   Provide needs based on ASPEN/SCCM guidelines  Unmet  MONITOR:   Vent status, Labs, Weight trends  REASON FOR ASSESSMENT:   Consult New TPN/TNA  ASSESSMENT:   59 yo male had severe abdominal pain, and then found unresponsive at home. In ER he was in shock, cyanotic, with lactic acidosis, and hyperkalemia. S/P ex lap with placement of wound VAC to abd wound.  S/P closure of abdominal wound and placement of wound VAC on 8/21. NGT in place with 800 ml bilious output. Increased protein needs to support healing.   Patient is currently intubated on ventilator support Temp (24hrs), Avg:98.7 F (37.1 C), Min:98.2 F (36.8 C), Max:99.3 F (37.4 C)  Propofol: 24 ml/hr providing 634 kcals per day.  TPN with Clinimix E 5/15 @ 40 ml/hr to start today. Doubt will be able to meet protein needs without exceeding calorie needs. Recommend ~1200 kcals from TPN, with remainder of calories being provided by Propofol.   Diet Order:  TPN (CLINIMIX) Adult without lytes  Skin:  Wound (see comment) (VAC to abdominal wound)  Last BM:  PTA  Height:   Ht Readings from Last 1 Encounters:  01/05/15 6' 2.5" (1.892 m)    Weight:   Wt Readings from Last 1 Encounters:  01/15/15 285 lb 4.4 oz (129.4 kg)    Ideal Body Weight:  87.7 kg  BMI:  Body mass index is 36.15 kg/(m^2).  Estimated Nutritional Needs:   Kcal:  5681-2751  Protein:  >/= 175 gm  Fluid:  2 L  EDUCATION NEEDS:   No education needs identified at this time  Joaquin Courts, RD, LDN, CNSC Pager [phone] After Hours Pager [phone]

## 2015-01-15 NOTE — Progress Notes (Signed)
Patient ID: Jared [name], male   DOB: 1955/07/15, 59 y.o.   MRN: [id number] 2 Days Post-Op  Subjective: Pt's pain control much better.  Still with high output from NGT.  Objective: Vital signs in last 24 hours: Temp:  [98.2 F (36.8 C)-99.3 F (37.4 C)] 99.3 F (37.4 C) (08/25 0810) Pulse Rate:  [58-81] 73 (08/25 0824) Resp:  [13-25] 13 (08/25 0824) BP: (94-140)/(53-74) 125/67 mmHg (08/25 0824) SpO2:  [95 %-100 %] 96 % (08/25 0824) Arterial Line BP: (87-146)/(40-73) 106/49 mmHg (08/25 0700) FiO2 (%):  [40 %] 40 % (08/25 0824) Weight:  [129.4 kg (285 lb 4.4 oz)] 129.4 kg (285 lb 4.4 oz) (08/25 0500)    Intake/Output from previous day: 08/24 0701 - 08/25 0700 In: 1195 [I.V.:955; NG/GT:40; IV Piggyback:200] Out: 3393 [Urine:48; Emesis/NG output:800] Intake/Output this shift:    PE: Abd: seems softer, but still 800cc bilious NGT output, minimal to absent BS, midline wound is clean and packed.  Lab Results:   Recent Labs  01/14/15 0328 01/15/15 0442  WBC 10.9* 10.2  HGB 12.7* 11.7*  HCT 36.1* 33.9*  PLT 86* 90*   BMET  Recent Labs  01/14/15 1600 01/15/15 0442  NA 134* 136  137  K 4.0 3.9  3.9  CL 100* 102  103  CO2 21* 21*  22  GLUCOSE 88 81  79  BUN 54* 58*  58*  CREATININE 6.00* 6.51*  6.50*  CALCIUM 7.8* 7.9*  8.0*   PT/INR No results for input(s): LABPROT, INR in the last 72 hours. CMP     Component Value Date/Time   NA 137 01/15/2015 0442   NA 136 01/15/2015 0442   K 3.9 01/15/2015 0442   K 3.9 01/15/2015 0442   CL 103 01/15/2015 0442   CL 102 01/15/2015 0442   CO2 22 01/15/2015 0442   CO2 21* 01/15/2015 0442   GLUCOSE 79 01/15/2015 0442   GLUCOSE 81 01/15/2015 0442   BUN 58* 01/15/2015 0442   BUN 58* 01/15/2015 0442   CREATININE 6.50* 01/15/2015 0442   CREATININE 6.51* 01/15/2015 0442   CREATININE 1.15 12/17/2014 1707   CALCIUM 8.0* 01/15/2015 0442   CALCIUM 7.9* 01/15/2015 0442   PROT 4.9* 01/15/2015 0442   ALBUMIN 1.9*  01/15/2015 0442   ALBUMIN 2.0* 01/15/2015 0442   AST 93* 01/15/2015 0442   ALT 573* 01/15/2015 0442   ALKPHOS 65 01/15/2015 0442   BILITOT 5.3* 01/15/2015 0442   GFRNONAA 8* 01/15/2015 0442   GFRNONAA 8* 01/15/2015 0442   GFRNONAA 68 12/17/2014 1707   GFRAA 10* 01/15/2015 0442   GFRAA 10* 01/15/2015 0442   GFRAA 79 12/17/2014 1707   Lipase     Component Value Date/Time   LIPASE 80* 01/12/2015 0414       Studies/Results: Dg Chest Port 1 View  01/15/2015   CLINICAL DATA:  Extubation.  EXAM: PORTABLE CHEST - 1 VIEW  COMPARISON:  01/13/2015.  FINDINGS: Interim extubation and removal of NG tube and left IJ tube. Right IJ line noted with tip at cavoatrial junction. Cardiomegaly with continued improvement of pulmonary venous congestion and interstitial edema. Interval resolution of pleural effusions. No pneumothorax. Linear density along the right chest most likely represents a skin fold as lung markings are noted distal to this linear density. Mild gastric distention.  IMPRESSION: 1. Interim extubation and removal of NG tube and left IJ line. Right IJ line tip noted at cavoatrial junction. 2. Cardiomegaly with continued improvement of pulmonary vascular congestion and  interstitial edema. Interim resolution of pleural effusions. 3. Gastric distention.   Electronically Signed   By: Maisie Fus  Register   On: 01/15/2015 07:02   Dg Abd Portable 1v  01/13/2015   CLINICAL DATA:  Abdominal distension of uncertain etiology, septic shock, acute respiratory failure, acute renal injury.  EXAM: PORTABLE ABDOMEN - 1 VIEW  COMPARISON:  Portable abdominal film of January 09, 2015  FINDINGS: There is a relative paucity of bowel gas. Distended bowel loops are not observed. A nasogastric tube is in place with the tip in the region of the distal pylorus or proximal duodenum. There are no abnormal soft tissue calcifications. The patient has undergone a previous lumbosacral fusion posteriorly. The right-sided femoral  catheter has been removed. The left-sided femoral catheter remains.  IMPRESSION: No acute intra-abdominal abnormality is demonstrated. The esophagogastric tube appears to be in appropriate position.   Electronically Signed   By: David  [place] M.D.   On: 01/13/2015 11:13    Anti-infectives: Anti-infectives    Start     Dose/Rate Route Frequency Ordered Stop   01/11/15 1630  vancomycin (VANCOCIN) 1,250 mg in sodium chloride 0.9 % 250 mL IVPB  Status:  Discontinued     1,250 mg 166.7 mL/hr over 90 Minutes Intravenous Every 24 hours 01/11/15 1029 01/14/15 1150   01/10/15 1300  vancomycin (VANCOCIN) 1,750 mg in sodium chloride 0.9 % 500 mL IVPB  Status:  Discontinued     1,750 mg 250 mL/hr over 120 Minutes Intravenous Every 48 hours 01/08/15 1327 01/09/15 1537   01/10/15 1300  vancomycin (VANCOCIN) 1,250 mg in sodium chloride 0.9 % 250 mL IVPB  Status:  Discontinued     1,250 mg 166.7 mL/hr over 90 Minutes Intravenous Every 24 hours 01/10/15 0947 01/11/15 1029   01/10/15 1200  piperacillin-tazobactam (ZOSYN) IVPB 2.25 g     2.25 g 100 mL/hr over 30 Minutes Intravenous 4 times per day 01/10/15 0929     01/09/15 1400  piperacillin-tazobactam (ZOSYN) IVPB 2.25 g  Status:  Discontinued     2.25 g 100 mL/hr over 30 Minutes Intravenous 3 times per day 01/09/15 0908 01/10/15 0929   01/09/15 1100  cefTRIAXone (ROCEPHIN) 1 g in dextrose 5 % 50 mL IVPB  Status:  Discontinued     1 g 100 mL/hr over 30 Minutes Intravenous Every 24 hours 01/08/15 1133 01/08/15 1327   01/09/15 1100  azithromycin (ZITHROMAX) 500 mg in dextrose 5 % 250 mL IVPB  Status:  Discontinued     500 mg 250 mL/hr over 60 Minutes Intravenous Every 24 hours 01/08/15 1133 01/08/15 1327   01/08/15 2200  piperacillin-tazobactam (ZOSYN) IVPB 3.375 g  Status:  Discontinued     3.375 g 12.5 mL/hr over 240 Minutes Intravenous 3 times per day 01/08/15 1327 01/09/15 0908   01/08/15 1400  vancomycin (VANCOCIN) 2,500 mg in sodium chloride 0.9 %  500 mL IVPB     2,500 mg 250 mL/hr over 120 Minutes Intravenous  Once 01/08/15 1327 01/08/15 1747   01/08/15 1330  piperacillin-tazobactam (ZOSYN) IVPB 3.375 g  Status:  Discontinued     3.375 g 100 mL/hr over 30 Minutes Intravenous  Once 01/08/15 1316 01/10/15 0929   01/08/15 1130  cefTRIAXone (ROCEPHIN) 1 g in dextrose 5 % 50 mL IVPB     1 g 100 mL/hr over 30 Minutes Intravenous  Once 01/08/15 1126 01/08/15 1220   01/08/15 1130  azithromycin (ZITHROMAX) 500 mg in dextrose 5 % 250 mL IVPB  500 mg 250 mL/hr over 60 Minutes Intravenous  Once 01/08/15 1126 01/08/15 1322       Assessment/Plan  POD 7, 4 s/p ex lap with open abdominal VAC placement and then closure -Ileus-con't NGT and await bowel function -TNA initiated -Con't dressing changes BID -TB increasing to 5.3, but other LFTs normalizing.  Suspect this is still secondary to his overwhelming shock MMP -per primary service and other specialties DVT prophylaxis -SCDs, heparin held due to concern for HIT    LOS: 7 days    Calianna Kim E 01/15/2015, 9:24 AM Pager: [phone]

## 2015-01-16 LAB — BASIC METABOLIC PANEL
ANION GAP: 10 (ref 5–15)
BUN: 49 mg/dL — ABNORMAL HIGH (ref 6–20)
CALCIUM: 7.8 mg/dL — AB (ref 8.9–10.3)
CHLORIDE: 104 mmol/L (ref 101–111)
CO2: 24 mmol/L (ref 22–32)
CREATININE: 6.65 mg/dL — AB (ref 0.61–1.24)
GFR calc non Af Amer: 8 mL/min — ABNORMAL LOW (ref >60)
GFR, EST AFRICAN AMERICAN: 9 mL/min — AB (ref >60)
Glucose, Bld: 125 mg/dL — ABNORMAL HIGH (ref 65–99)
Potassium: 4.1 mmol/L (ref 3.5–5.1)
SODIUM: 138 mmol/L (ref 135–145)

## 2015-01-16 LAB — RENAL FUNCTION PANEL
Albumin: 1.8 g/dL — ABNORMAL LOW (ref 3.5–5.0)
Anion gap: 13 (ref 5–15)
BUN: 79 mg/dL — AB (ref 6–20)
CHLORIDE: 101 mmol/L (ref 101–111)
CO2: 20 mmol/L — AB (ref 22–32)
CREATININE: 9.2 mg/dL — AB (ref 0.61–1.24)
Calcium: 7.8 mg/dL — ABNORMAL LOW (ref 8.9–10.3)
GFR calc Af Amer: 6 mL/min — ABNORMAL LOW (ref >60)
GFR, EST NON AFRICAN AMERICAN: 5 mL/min — AB (ref >60)
GLUCOSE: 109 mg/dL — AB (ref 65–99)
POTASSIUM: 3.7 mmol/L (ref 3.5–5.1)
Phosphorus: 5.4 mg/dL — ABNORMAL HIGH (ref 2.5–4.6)
Sodium: 134 mmol/L — ABNORMAL LOW (ref 135–145)

## 2015-01-16 LAB — GLUCOSE, CAPILLARY
GLUCOSE-CAPILLARY: 107 mg/dL — AB (ref 65–99)
Glucose-Capillary: 102 mg/dL — ABNORMAL HIGH (ref 65–99)
Glucose-Capillary: 104 mg/dL — ABNORMAL HIGH (ref 65–99)
Glucose-Capillary: 117 mg/dL — ABNORMAL HIGH (ref 65–99)
Glucose-Capillary: 151 mg/dL — ABNORMAL HIGH (ref 65–99)

## 2015-01-16 LAB — CBC WITH DIFFERENTIAL/PLATELET
Basophils Absolute: 0 10*3/uL (ref 0.0–0.1)
Basophils Relative: 0 % (ref 0–1)
EOS ABS: 0.6 10*3/uL (ref 0.0–0.7)
EOS PCT: 6 % — AB (ref 0–5)
HCT: 30.1 % — ABNORMAL LOW (ref 39.0–52.0)
Hemoglobin: 10.6 g/dL — ABNORMAL LOW (ref 13.0–17.0)
LYMPHS ABS: 1.1 10*3/uL (ref 0.7–4.0)
LYMPHS PCT: 11 % — AB (ref 12–46)
MCH: 30.5 pg (ref 26.0–34.0)
MCHC: 35.2 g/dL (ref 30.0–36.0)
MCV: 86.7 fL (ref 78.0–100.0)
MONOS PCT: 6 % (ref 3–12)
Monocytes Absolute: 0.6 10*3/uL (ref 0.1–1.0)
Neutro Abs: 7.9 10*3/uL — ABNORMAL HIGH (ref 1.7–7.7)
Neutrophils Relative %: 78 % — ABNORMAL HIGH (ref 43–77)
PLATELETS: 96 10*3/uL — AB (ref 150–400)
RBC: 3.47 MIL/uL — AB (ref 4.22–5.81)
RDW: 14.5 % (ref 11.5–15.5)
WBC: 10.2 10*3/uL (ref 4.0–10.5)

## 2015-01-16 LAB — MAGNESIUM: MAGNESIUM: 2.9 mg/dL — AB (ref 1.7–2.4)

## 2015-01-16 LAB — GLOMERULAR BASEMENT MEMBRANE ANTIBODIES: GBM AB: 6 U (ref 0–20)

## 2015-01-16 LAB — HEPARIN LEVEL (UNFRACTIONATED): Heparin Unfractionated: <0.1 IU/mL — ABNORMAL LOW (ref 0.30–0.70)

## 2015-01-16 MED ORDER — MIDAZOLAM HCL 2 MG/2ML IJ SOLN
INTRAMUSCULAR | Status: AC
  Administered 2015-01-16: 2 mg
  Filled 2015-01-16: qty 2

## 2015-01-16 MED ORDER — PENTAFLUOROPROP-TETRAFLUOROETH EX AERO: 1.0000 "application " | INHALATION_SPRAY | CUTANEOUS | Status: DC | PRN

## 2015-01-16 MED ORDER — HYDRALAZINE HCL 20 MG/ML IJ SOLN
10.0000 mg | INTRAMUSCULAR | Status: DC | PRN
  Administered 2015-01-16 – 2015-01-17 (×5): 10 mg via INTRAVENOUS
  Filled 2015-01-16 (×5): qty 1

## 2015-01-16 MED ORDER — TRACE MINERALS CR-CU-MN-SE-ZN 10-1000-500-60 MCG/ML IV SOLN
INTRAVENOUS | Status: AC
  Administered 2015-01-16: 17:00:00 via INTRAVENOUS
  Filled 2015-01-16: qty 1992

## 2015-01-16 MED ORDER — ALTEPLASE 2 MG IJ SOLR
2.0000 mg | Freq: Once | INTRAMUSCULAR | Status: DC | PRN
  Filled 2015-01-16: qty 2

## 2015-01-16 MED ORDER — LIDOCAINE-PRILOCAINE 2.5-2.5 % EX CREA
1.0000 "application " | TOPICAL_CREAM | CUTANEOUS | Status: DC | PRN
  Filled 2015-01-16: qty 5

## 2015-01-16 MED ORDER — LIDOCAINE HCL (PF) 1 % IJ SOLN: 5.0000 mL | INTRAMUSCULAR | Status: DC | PRN

## 2015-01-16 MED ORDER — POTASSIUM CHLORIDE 10 MEQ/50ML IV SOLN
10.0000 meq | INTRAVENOUS | Status: AC
  Filled 2015-01-16: qty 50

## 2015-01-16 MED ORDER — SODIUM CHLORIDE 0.9 % IV SOLN: 100.0000 mL | INTRAVENOUS | Status: DC | PRN

## 2015-01-16 MED ORDER — MIDAZOLAM HCL 2 MG/2ML IJ SOLN: 2.0000 mg | Freq: Once | INTRAMUSCULAR | Status: AC

## 2015-01-16 MED ORDER — NEPRO/CARBSTEADY PO LIQD
237.0000 mL | ORAL | Status: DC | PRN
  Filled 2015-01-16: qty 237

## 2015-01-16 MED ORDER — DEXMEDETOMIDINE HCL IN NACL 400 MCG/100ML IV SOLN
0.0000 ug/kg/h | INTRAVENOUS | Status: DC
  Administered 2015-01-16 (×2): 1.2 ug/kg/h via INTRAVENOUS
  Administered 2015-01-16: 0.8 ug/kg/h via INTRAVENOUS
  Administered 2015-01-16: 0.4 ug/kg/h via INTRAVENOUS
  Administered 2015-01-16 – 2015-01-17 (×2): 1.2 ug/kg/h via INTRAVENOUS
  Administered 2015-01-17 (×2): 1.1 ug/kg/h via INTRAVENOUS
  Administered 2015-01-17 (×2): 1.2 ug/kg/h via INTRAVENOUS
  Administered 2015-01-17 (×2): 1.1 ug/kg/h via INTRAVENOUS
  Administered 2015-01-17: 1.2 ug/kg/h via INTRAVENOUS
  Administered 2015-01-17: 1 ug/kg/h via INTRAVENOUS
  Administered 2015-01-18: 0.6 ug/kg/h via INTRAVENOUS
  Filled 2015-01-16: qty 50
  Filled 2015-01-16 (×2): qty 100
  Filled 2015-01-16: qty 300
  Filled 2015-01-16: qty 100
  Filled 2015-01-16: qty 50
  Filled 2015-01-16 (×2): qty 100
  Filled 2015-01-16: qty 50
  Filled 2015-01-16 (×2): qty 100
  Filled 2015-01-16: qty 200

## 2015-01-16 NOTE — Progress Notes (Signed)
Profuse bleeding noted from abdominal surgical site after 23:00 dressing change. Suspected bleeding from previous heparin restart on 8/25 and rate increase from 1750 units/hr to 2150 units/hr at 19:49; heparin stopped; [facility] MD Dr Janee Morn called and notified, order received to hold heparin for now; dressing changed and reinforced. Will continue to monitor and notify [facility] if bleeding persists.

## 2015-01-16 NOTE — Progress Notes (Signed)
ANTIBIOTIC CONSULT NOTE - Follow Up Pharmacy Consult for Zosyn Indication: Sepsis due to abdominal source  Allergies  Allergen Reactions  . Morphine And Related Anaphylaxis and Shortness Of Breath  . Montelukast Other (See Comments)    unknown  . Neurontin [Gabapentin] Other (See Comments)    delusional  . Wellbutrin [Bupropion] Palpitations    Insomnia    Labs:  Recent Labs  01/14/15 0328 01/14/15 1600 01/15/15 0442 01/16/15 0340  WBC 10.9*  --  10.2 10.2  HGB 12.7*  --  11.7* 10.6*  PLT 86*  --  90* 96*  CREATININE 6.56* 6.00* 6.51*  6.50* 9.20*   Estimated Creatinine Clearance: 12 mL/min (by C-G formula based on Cr of 9.2). No results for input(s): VANCOTROUGH, VANCOPEAK, VANCORANDOM, GENTTROUGH, GENTPEAK, GENTRANDOM, TOBRATROUGH, TOBRAPEAK, TOBRARND, AMIKACINPEAK, AMIKACINTROU, AMIKACIN in the last 72 hours.   Microbiology: Recent Results (from the past 720 hour(s))  Blood Culture (routine x 2)     Status: None   Collection Time: 01/08/15 11:31 AM  Result Value Ref Range Status   Specimen Description BLOOD A-LINE  Final   Special Requests BOTTLES DRAWN AEROBIC AND ANAEROBIC 10CC  Final   Culture NO GROWTH 5 DAYS  Final   Report Status 01/13/2015 FINAL  Final  Blood Culture (routine x 2)     Status: None   Collection Time: 01/08/15 12:11 PM  Result Value Ref Range Status   Specimen Description BLOOD LEFT ANTECUBITAL  Final   Special Requests BOTTLES DRAWN AEROBIC AND ANAEROBIC 5CC  Final   Culture NO GROWTH 5 DAYS  Final   Report Status 01/13/2015 FINAL  Final  MRSA PCR Screening     Status: None   Collection Time: 01/08/15  3:30 PM  Result Value Ref Range Status   MRSA by PCR NEGATIVE NEGATIVE Final    Comment:        The GeneXpert MRSA Assay (FDA approved for NASAL specimens only), is one component of a comprehensive MRSA colonization surveillance program. It is not intended to diagnose MRSA infection nor to guide or monitor treatment for MRSA  infections.   Urine culture     Status: None   Collection Time: 01/08/15  4:39 PM  Result Value Ref Range Status   Specimen Description URINE, CATHETERIZED  Final   Special Requests NONE  Final   Culture NO GROWTH 2 DAYS  Final   Report Status 01/10/2015 FINAL  Final    Assessment: 34 YOM with history of HTN, HLD, pre-diabetes, chronic pain and depression- [facility] was called for unresponsive agonal breathing- patient improved in the field with CPAP and narcan. Suspected sepsis s/p exploratory lap with application of wound VAC.  Currently on D#9 of Zosyn  WBC wnl, LA improved 9.3>1.5>>0.8, Afebrile, PCT down to 41>25.12   8/18 BCx > NG final 8/18 Urine > NG final  8/18 MRSA PCR > Neg HIV neg   Zosyn 8/18 > Vancomycin 8/18 >  Patient has been switched from CVVHD to IHD.   Goal of Therapy:  Appropriate dosing with CVVHD  Plan:  Zosyn changed to 2.25 g IV Q 8 hours Follow up LOT. Consider limiting antibiotic therapy to 10 days   Vinnie Level, PharmD., BCPS Clinical Pharmacist Pager [phone]

## 2015-01-16 NOTE — Procedures (Signed)
Patient was seen on dialysis and the procedure was supervised. BFR 400 Via temporary RIJ HD cath BP is 107/68.  Patient appears to be tolerating treatment well. Assessment/Plan:  1. AKI/CKD stage 2, oliguric presumably due to septic shock. CVVHD stopped 01/14/15 and first IHD today. 1. Plan for HD MWF while needed and if he can tolerate from hemodynamic standpoint 2. VDRF- per PCCM 3. SIRS- possible PE (RA and RV dilated, RV hypocontractile on ECHO). CT angio on hold for now (concerns for renal recovery as well as IV access). Currently off of pressors. 4. CAD- cath revealed occluded OM, cardiology following 5. Shock liver- starting to improve 6. S/p exploratory lap and fascia closure now s/p wound vac 7. Metabolic acidosis- improved with CVVHD 8. 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 9. Hypoalbuminemia- per primary svc

## 2015-01-16 NOTE — Progress Notes (Signed)
eLink Physician-Brief Progress Note Patient Name: Jared [name] DOB: 1956-03-09 MRN: [id number]   Date of Service  01/16/2015  HPI/Events of Note  Patient off versed gtt today remains on precedex and dilaudid.  Agitated when moved.  eICU Interventions  Plan: Versed 2 mg IV for abd dressing change     Intervention Category Major Interventions: Delirium, psychosis, severe agitation - evaluation and management  DETERDING,ELIZABETH 01/16/2015, 11:09 PM

## 2015-01-16 NOTE — Progress Notes (Signed)
Patient Name: Jared [name] Date of Encounter: 01/16/2015 Active Problems:   Septic shock   Acute respiratory failure with hypoxia   Shock   Acute respiratory failure with hypoxemia   Cardiogenic shock   AKI (acute kidney injury)   Renal failure   NSTEMI (non-ST elevated myocardial infarction)  Length of Stay: 8  SUBJECTIVE  Still intubated. Had bleeding from wound overnight and heparin gtt was stopped. Bleeding from mouth?   CURRENT MEDS . antiseptic oral rinse  7 mL Mouth Rinse QID  . aspirin  300 mg Rectal Daily  . chlorhexidine gluconate  15 mL Mouth Rinse BID  . insulin aspart  0-9 Units Subcutaneous 6 times per day  . pantoprazole (PROTONIX) IV  40 mg Intravenous QHS  . piperacillin-tazobactam (ZOSYN)  IV  2.25 g Intravenous 3 times per day  . potassium chloride  10 mEq Intravenous Q1 Hr x 2  . sodium chloride  10 mL Intravenous Q12H    OBJECTIVE  Filed Vitals:   01/16/15 1015 01/16/15 1030 01/16/15 1045 01/16/15 1100  BP: 123/58 109/51 101/46 94/43  Pulse: 74 69 68 66  Temp:      TempSrc:      Resp: Weight:      SpO2: 100% 99% 97% 97%    Intake/Output Summary (Last 24 hours) at 01/16/15 1118 Last data filed at 01/16/15 1000  Gross per 24 hour  Intake 1678.48 ml  Output    593 ml  Net 1085.48 ml   Filed Weights   01/15/15 0500 01/16/15 0400 01/16/15 0700  Weight: 285 lb 4.4 oz (129.4 kg) 283 lb 4.7 oz (128.5 kg) 282 lb 3 oz (128 kg)    PHYSICAL EXAM  General: Intubated and sedated.  Neuro: Moves all extremities spontaneously. Psych: unable to assess HEENT: ETT tube  Neck: Supple without bruits or JVD. Lungs:  Resp regular and unlabored, CTA. Heart: RRR no s3, s4, or murmurs. Abdomen: Soft, non-tender, non-distended, BS + x 4.  Extremities: No clubbing, cyanosis or edema. DP/PT/Radials 2+ and equal bilaterally. Compression stocking in place.   Accessory Clinical Findings  CBC  Recent Labs  01/15/15 0442 01/16/15 0340  WBC  10.2 10.2  NEUTROABS 8.3* 7.9*  HGB 11.7* 10.6*  HCT 33.9* 30.1*  MCV 88.7 86.7  PLT 90* 96*   Basic Metabolic Panel  Recent Labs  01/15/15 0442 01/16/15 0340  NA 136  137 134*  K 3.9  3.9 3.7  CL 102  103 101  CO2 21*  22 20*  GLUCOSE 81  79 109*  BUN 58*  58* 79*  CREATININE 6.51*  6.50* 9.20*  CALCIUM 7.9*  8.0* 7.8*  MG 2.9* 2.9*  PHOS 4.3  4.3 5.4*   Liver Function Tests  Recent Labs  01/15/15 0442 01/16/15 0340  AST 93*  --   ALT 573*  --   ALKPHOS 65  --   BILITOT 5.3*  --   PROT 4.9*  --   ALBUMIN 2.0*  1.9* 1.8*   No results for input(s): LIPASE, AMYLASE in the last 72 hours. Cardiac Enzymes  Recent Labs  01/15/15 0442  CKTOTAL 717*  CKMB 11.9*  TROPONINI 0.99*   Fasting Lipid Panel  Recent Labs  01/14/15 0328  TRIG 380*    TELE  NSR  Cath 01/13/15 Conclusion    1. Apparent occluded large branch of obtuse marginal. 2. Right coronary artery appears patent but could only be visualized nonselectively. 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 3. Normal ascending aorta. 4. Normal LVEDP.  Continue supportive care for his shock physiology and respiratory failure. Left main, LAD were selectively engaged and appear patent. The RCA appears patent nonselectively. Ventriculogram not performed.     ASSESSMENT AND PLAN    (Cardiogenic?) shock - improved, off pressors. Cath showed occluded OM branch (culprit? Chronic?). RCA could not be selectively injected, but was seen to be patent. Possible pulmonary embolism, but note negative venous Dopplers.  NSTEMI with severe troponin elevation after resuscitation (global ischemia?). OM branch occlusion appears insufficient to cause such an elevation in troponin.  Bradycardia, resolved. Possibly due to amiodarone given for VT/VF  Acute renal failure on CRRT. Still essentially anuric. Planning to switch to intermittent HD. Plan for HD tomorrow  S/P cardiorespiratory arrest -  seems that a pulmonary event triggered this. Initially improved with narcan and BiPAP, developed ventricular arrhythmia after pressors were initiated for hypotension. May have subsequently had acute MI. Hard to put everything together.  Thrombocytopenia Slow decay in platelet count may be due to consumption in CRRT circuit, but raises concern for HIT. Argatroban stopped due to bleeding. Platelets improved further today to 96. Now off heparin due to bleeding.  Plan: No further cardiac workup at this time. Will sign off. Call with a questions.   Signed, Bhagat,Bhavinkumar PA-C  I have seen and examined the patient along with Bhagat,Bhavinkumar PA-C.  I have reviewed the chart, notes and new data.  I agree with PA's note.  PLAN: Please call us if there are questions over the weekend  [name] Fair, MD, [facility] [phone] 01/16/2015, 11:32 AM

## 2015-01-16 NOTE — Progress Notes (Signed)
PARENTERAL NUTRITION CONSULT NOTE - Follow-up  Pharmacy Consult for TPN Indication: Prolonged Ileus  Allergies  Allergen Reactions  . Morphine And Related Anaphylaxis and Shortness Of Breath  . Montelukast Other (See Comments)    unknown  . Neurontin [Gabapentin] Other (See Comments)    delusional  . Wellbutrin [Bupropion] Palpitations    Insomnia    Patient Measurements: Weight: 282 lb 3 oz (128 kg) Usual Weight: 138 kg on admission (8/20)  Vital Signs: Temp: 98.4 F (36.9 C) (08/26 0900) Temp Source: Oral (08/26 0900) BP: 108/50 mmHg (08/26 0900) Pulse Rate: 66 (08/26 0900) Intake/Output from previous day: 08/25 0701 - 08/26 0700 In: 1636.5 [I.V.:959.8; NG/GT:40; IV Piggyback:100; TPN:536.7] Out: 688 [Urine:38; Emesis/NG output:650] Intake/Output from this shift: Total I/O In: 50 [I.V.:10; TPN:40] Out: 15 [Urine:15]  Labs:  Recent Labs  01/13/15 1730 01/14/15 0328 01/15/15 0442 01/16/15 0340  WBC  --  10.9* 10.2 10.2  HGB  --  12.7* 11.7* 10.6*  HCT  --  36.1* 33.9* 30.1*  PLT  --  86* 90* 96*  APTT 51* 59*  --   --      Recent Labs  01/14/15 0328 01/14/15 1600 01/15/15 0442 01/16/15 0340  NA 134* 134* 136  137 134*  K 3.9 4.0 3.9  3.9 3.7  CL 101 100* 102  103 101  CO2 23 21* 21*  22 20*  GLUCOSE 75 88 81  79 109*  BUN 56* 54* 58*  58* 79*  CREATININE 6.56* 6.00* 6.51*  6.50* 9.20*  CALCIUM 7.8* 7.8* 7.9*  8.0* 7.8*  MG 2.6*  --  2.9* 2.9*  PHOS 4.0 3.8 4.3  4.3 5.4*  PROT  --   --  4.9*  --   ALBUMIN 2.0* 2.0* 2.0*  1.9* 1.8*  AST  --   --  93*  --   ALT  --   --  573*  --   ALKPHOS  --   --  65  --   BILITOT  --   --  5.3*  --   PREALBUMIN  --   --  12.8*  --   TRIG 380*  --   --   --    Estimated Creatinine Clearance: 12.1 mL/min (by C-G formula based on Cr of 9.2).    Recent Labs  01/15/15 2005 01/16/15 0021 01/16/15 0822  GLUCAP 89 104* 102*   Insulin Requirements in the past 24 hours:  0 units SSI since TPN  start last night  Current Nutrition:  Clinimix 5/15 at 81ml/hr  Assessment: 59 yo M presents on 8/18 after being found down at home. Was responsive to Narcan and then found to be hypotensive with elevated WBC count in ED. Went to surgery on 8/18 for ex lap but everything looked ok. Being treated for acute resp failure, RV failure with presumed PE,  ATN with metabolic acidosis. Now day #8 post-op with prolonged ileus. Pharmacy consulted to start TPN.  Surgeries/Procedures: Ex lap on 8/18 where no ischemic bowel or perforation was found. 8/21 went back to OR for closure of abdominal fascia and placement of wound VAC. GI: Prolonged Ileus. Surgery would like to start TFs soon but still having high output from NGT ( yesterday). PPI Endo: CBGs well controlled, has not required SSI Lytes: Na 134, K 3.7, Phos 5.4, Mg 2.9, CoCa 9.56 (phos x Ca = 51.6). (Goal K > 4.0 and Mg > 2.0)  Renal: Was on CRRT but stopped 8/24. Renal plans  for iHD prn with plans to give first session on 8/26. UOP < 79ml yesterday. Pulm: Intubated 8/18. FiO2 at 40%. Cards: RV failure with presumed PE. 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 Now stopped due to bleeding. CT on hold due to concerns for renal recovery. HIT panel negative. Hgb 10.6, plts low at 96. aspirin Hepatobil: LFTs elevated but trending down. Albumin low at 1.8. Prealbumin low at 12.8. Tbili increasing to 5.3 (no jaundice), trigs 380 Neuro: Sedated on dilaudid/midazolam gtts  ID: Sepsis with presumed abdmonial source. Afebrile, WBC wnl.  8/18 Blood cx > 8/18 Urine cx >  8/18 Vancomycin >> 8/23 8/18 Zosyn >>  Best Practices: SCDs TPN Access: 8/20 - CVC triple lumen TPN start date: 8/25 >>  Nutritional Goals: per RD on 8/25 KCal: 3491-7915 Protein: >175 g Fluid: 2 L  Plan:  - Increase clinimix 5/15 (no electrolytes) to 55ml/hr - provides 100gm protein (57% goal) and 1414 kcal (99% goal) - will be unable to meet protein  requirements d/t limitations of pre-mixed Clinimix bags - Hold IVFE for first 7 days for ICU patients per ASPEN guidelines (Day #2) - Continue MVI and trace elements in TPN (monitor for jaundice) - Potassium chloride IV Q1H x 2 runs - Continue sensitive SSI and adjust as needed - may be able to DC once TPN at goal - F/u AM BMET, Mg, Phos  Andreea Arca, Drake Leach 01/16/2015,9:05 AM

## 2015-01-16 NOTE — Care Management Note (Signed)
Case Management Note  Patient Details  Name: Jared [name] MRN: [id number] Date of Birth: December 02, 1955  Subjective/Objective:    Pt found down at home by wife, admitted with septic shock, since admit s/p exp lap and on continued vent support                Action/Plan: PTA pt lived at home with spouse- NCM will follow for d/c needs and planning  Expected Discharge Date:                  Expected Discharge Plan:  Long Term Acute Care (LTAC)  In-House Referral:     Discharge planning Services  CM Consult  Post Acute Care Choice:    Choice offered to:     DME Arranged:    DME Agency:     HH Arranged:    HH Agency:     Status of Service:  In process, will continue to follow  Medicare Important Message Given:    Date Medicare IM Given:    Medicare IM give by:    Date Additional Medicare IM Given:    Additional Medicare Important Message give by:     If discussed at Long Length of Stay Meetings, dates discussed:  01/15/15  Additional Comments:  01/16/15- referral received for [facility]- call made to both [facility] and [facility] for [facility] referrals- they will assess for [facility] appropriateness - pt remains on VENT would need to be trached  Also had first HD tx today - would need HD bed at [facility]- CM to follow up on Monday regarding LTACH readiness.  01/13/15- pt continues on full Vent support and CRRT,   Darrold Span, RN 01/16/2015, 3:27 PM

## 2015-01-16 NOTE — Progress Notes (Signed)
PULMONARY / CRITICAL CARE MEDICINE   Name: Jared [name] MRN: [id number] DOB: 08/16/1955    ADMISSION DATE:  01/08/2015 CONSULTATION DATE:  8/18  REFERRING MD :  EDP Dr Criss Alvine  CHIEF COMPLAINT:  Found down  INITIAL PRESENTATION:   59 yo male had severe abdominal pain, and then found unresponsive at home.  In ER he was in shock, cyanotic, with lactic acidosis, and hyperkalemia.  STUDIES:  8/18 Echo >> EF 45 to 50%, mod RV dilation, mod/severe RV dysfx, PAS 47 mmHg 8/18 Renal u/s >> b/l renal echogenicity  SIGNIFICANT EVENTS: 8/18 Admit, CCS, cardiology consulted; brief PEA arrest; laparotomy with placement of wound vac; started heparin gtt for presumed PE 8/19 Renal consulted. Duplex LE negative for DVT 8/20 CRRT  8/21 OR for abd wound closure    8/21 :  To OR this am with abd wound closure , did well . CRRT started yesterday , no change in scr or UOP.  Bradycardia overnight with Amio stopped. HR improved but remains in upper 40s. Remains on pressors with Levo and Vaso , decreased demands    01/12/15; For cath later 01/12/2015 per Dr Jomarie Longs. On pressors. Anuric. On CRRT. Off pressors. Improved Shock Liver. On fent gtt and heparing gtt. 40% fio2   01/13/15: Cath delayed to 01/13/2015. Off pressors. On CRRT. On fent gtt . Off heparin gtt -> due to dropping platelet. HITT panel sent. RN reports significant abd tenderness. CCS reports patient has ileys    01/14/15: CCS reports ongoing ileus but planning TF in 1-2days. Offpressors. Ongong CRRT. Cath relatively clean except occlusion of OM branch - not felt to be primary cause of current illness. This is per renal note. No cath note seen in EMR.  On dilaudid and diprivan gtt for sedation.    Primarhy dx stll unclear and is on empiric Bivalirudin (angiomax) for PE - HITT panel pending. DUplex LE - repeat negative for DVT . RN reports bleeding around oral cavity. Platelets at 86K  01/15/15 - ANgiomax on hold x 24h due to oral bleeding.  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 Platelt around 90K. TNA being initiated 01/15/2015 due to ileus. Anuria with minimal urine otuput. CTA did not happen yesterday due to PIV issue. Today d/w renal - risk of long term anticoagulation for empiric PE better than long term HD. SEdated with diprivan and dilaudid 4mg   NOTED: CXR reports he is extubated but there are no bedside issues on vent   SUBJECTIVE/OVERNIGHT/INTERVAL HX 01/16/15: Bleeding around abd wound - surface bled and IV heparin held yesterday. Still hgih NG returns and on TNA. On dilaudid and versed gtt. Lot of pain and agitation. Tolerating intemittent HD - 2L volume removal. Making very little urine  VITAL SIGNS: Temp:  [97.8 F (36.6 C)-98.5 F (36.9 C)] 97.8 F (36.6 C) (08/26 1200) Pulse Rate:  [53-74] 65 (08/26 1200) Resp:  [22-29] 25 (08/26 1200) BP: (94-169)/(43-78) 114/59 mmHg (08/26 1200) SpO2:  [95 %-100 %] 97 % (08/26 1200) Arterial Line BP: (113-164)/(50-75) 148/69 mmHg (08/26 0800) FiO2 (%):  [40 %] 40 % (08/26 1200) Weight:  [126.5 kg (278 lb 14.1 oz)-128.5 kg (283 lb 4.7 oz)] 126.5 kg (278 lb 14.1 oz) (08/26 1107) HEMODYNAMICS:   VENTILATOR SETTINGS: Vent Mode:  [-] PRVC FiO2 (%):  [40 %] 40 % Set Rate:  [25 bmp] 25 bmp Vt Set:  [670 mL] 670 mL PEEP:  [5 cmH20] 5 cmH20 Plateau Pressure:  [20 cmH20-22 cmH20] 21 cmH20 INTAKE / OUTPUT:  Intake/Output Summary (Last 24 hours) at 01/16/15 1304 Last data filed at 01/16/15 1200  Gross per 24 hour  Intake 1780.75 ml  Output   2593 ml  Net -812.25 ml    Total I/O In: 350 [I.V.:150; TPN:200] Out: 2015 [Urine:15; Other:2000]  I/O last 3 completed shifts: In: 2245.7 [I.V.:1429; NG/GT:80; IV Piggyback:200] Out: 1752 [Urine:58; Emesis/NG output:1050; Other:644]   PHYSICAL EXAMINATION: General: critically ill appearing on vent  Neuro: sedated moves extremities. RASS -3 on dilaudid and versed gtt - followed commands and agitated per RN during WUA HEENT: ETT in  place Cardiovascular: SB , no murmur Lungs: faint basilar crackles Abdomen: mild distention, wound vac in place, tenderness diffusely + but improved siginifcantly Musculoskeletal: no edema  LABS:  PULMONARY  Recent Labs Lab 01/09/15 1901 01/10/15 0408 01/10/15 1615 01/11/15 1131  PHART 7.339* 7.348*  --  7.439  PCO2ART 45.3* 41.9  --  30.4*  PO2ART 88.0 106*  --  79.8*  HCO3 24.0 22.2  --  20.3  TCO2 25 23.4  --  21.2  O2SAT 95.0 96.7 76.4 95.1    CBC  Recent Labs Lab 01/14/15 0328 01/15/15 0442 01/16/15 0340  HGB 12.7* 11.7* 10.6*  HCT 36.1* 33.9* 30.1*  WBC 10.9* 10.2 10.2  PLT 86* 90* 96*    COAGULATION  Recent Labs Lab 01/09/15 1630 01/11/15 0413 01/12/15 0801  INR 2.24* 1.68* 1.34    CARDIAC    Recent Labs Lab 01/09/15 1512 01/09/15 2129 01/11/15 1215 01/15/15 0442  TROPONINI >65.00* 49.60* 4.71* 0.99*   No results for input(s): PROBNP in the last 168 hours.   CHEMISTRY  Recent Labs Lab 01/11/15 1215  01/13/15 0509 01/13/15 1550 01/14/15 0328 01/14/15 1600 01/15/15 0442 01/16/15 0340  NA 134*  < > 137 136 134* 134* 136  137 134*  K 3.4*  < > 4.1 4.1 3.9 4.0 3.9  3.9 3.7  CL 99*  < > 102 104 101 100* 102  103 101  CO2 20*  < > 22 22 23  21* 21*  22 20*  GLUCOSE 119*  < > 114* 77 75 88 81  79 109*  BUN 65*  < > 54* 58* 56* 54* 58*  58* 79*  CREATININE 8.55*  < > 6.72* 6.93* 6.56* 6.00* 6.51*  6.50* 9.20*  CALCIUM 6.7*  < > 7.6* 7.5* 7.8* 7.8* 7.9*  8.0* 7.8*  MG 2.4  --  2.5*  --  2.6*  --  2.9* 2.9*  PHOS 4.3  < > 4.6 4.0 4.0 3.8 4.3  4.3 5.4*  < > = values in this interval not displayed. Estimated Creatinine Clearance: 12 mL/min (by C-G formula based on Cr of 9.2).   LIVER  Recent Labs Lab 01/09/15 1630 01/10/15 0330 01/11/15 0413 01/12/15 0414 01/12/15 0801  01/13/15 1550 01/14/15 0328 01/14/15 1600 01/15/15 0442 01/16/15 0340  AST 4557* 2907* 1601* 686*  --   --   --   --   --  93*  --   ALT 4100*  3366* 2981* 2171*  --   --   --   --   --  573*  --   ALKPHOS 83 74 82 79  --   --   --   --   --  65  --   BILITOT 1.7* 1.7* 2.5* 3.5*  --   --   --   --   --  5.3*  --   PROT 4.1* 3.7* 3.9* 4.3*  --   --   --   --   --  4.9*  --   ALBUMIN 2.4* 2.1* 2.0* 2.0*  --   < > 1.9* 2.0* 2.0* 2.0*  1.9* 1.8*  INR 2.24*  --  1.68*  --  1.34  --   --   --   --   --   --   < > = values in this interval not displayed.   INFECTIOUS  Recent Labs Lab 01/09/15 1630 01/10/15 0330 01/11/15 0413 01/15/15 0145  LATICACIDVEN 1.7 1.5  --  0.8  PROCALCITON  --  37.87 25.12  --      ENDOCRINE CBG (last 3)   Recent Labs  01/16/15 0021 01/16/15 0822 01/16/15 1224  GLUCAP 104* 102* 117*         IMAGING x48h  - image(s) personally visualized  -   highlighted in bold Dg Chest Port 1 View  01/15/2015   CLINICAL DATA:  Repositioned endotracheal tube.  EXAM: PORTABLE CHEST - 1 VIEW  COMPARISON:  Earlier today.  FINDINGS: Stable enlarged cardiac silhouette. Endotracheal tube in satisfactory position. Bilateral jugular catheters are unchanged. Nasogastric tube extending into the stomach. Stable bibasilar opacities. Cervical spine fixation hardware is unchanged.  IMPRESSION: Stable cardiomegaly, bibasilar atelectasis, pneumonia and/or pleural fluid.   Electronically Signed   By: Beckie Salts M.D.   On: 01/15/2015 14:20   Dg Chest Port 1 View  01/15/2015   ADDENDUM REPORT: 01/15/2015 13:47  ADDENDUM: Please disregard this examination and report. They were created due to administrative error. Please refer to [id number] for the correct examination and report   Electronically Signed   By: Rad  Services   On: 01/15/2015 13:47   01/15/2015   CLINICAL DATA:  Extubation.  EXAM: PORTABLE CHEST - 1 VIEW  COMPARISON:  01/13/2015.  FINDINGS: Interim extubation and removal of NG tube and left IJ tube. Right IJ line noted with tip at cavoatrial junction. Cardiomegaly with continued improvement of pulmonary venous  congestion and interstitial edema. Interval resolution of pleural effusions. No pneumothorax. Linear density along the right chest most likely represents a skin fold as lung markings are noted distal to this linear density. Mild gastric distention.  IMPRESSION: 1. Interim extubation and removal of NG tube and left IJ line. Right IJ line tip noted at cavoatrial junction. 2. Cardiomegaly with continued improvement of pulmonary vascular congestion and interstitial edema. Interim resolution of pleural effusions. 3. Gastric distention.  Electronically Signed: By: Maisie Fus  Register On: 01/15/2015 07:02   Dg Chest Port 1 View  01/15/2015   CLINICAL DATA:  Respiratory distress.  EXAM: PORTABLE CHEST - 1 VIEW  COMPARISON:  Chest radiograph 01/13/2015  FINDINGS: ET tube terminates in the mid trachea. Internal jugular central venous catheter tips are stable in position. Enteric tube courses inferior to the diaphragm. Stable cardiomegaly. Layering moderate bilateral pleural effusions with underlying mid and lower lung hazy pulmonary opacities. No definite pneumothorax.  IMPRESSION: Moderate bilateral pleural effusions with underlying atelectasis and or edema.  Stable support apparatus.   Electronically Signed   By: Annia Belt M.D.   On: 01/15/2015 12:51         ASSESSMENT / PLAN:  PULMONARY ETT 8/18 >>> A: Acute respiratory failure 2nd to presumed PE, lactic acidosis. Hx of OSA    - does not meet sbt criteria 01/16/2015. ? ET tube position high or false CXR  - HITT Ab negative 01/13/15  Duplex LE negative for DVT 01/13/15  - Local abd wound bleeding 01/15/15 in setting of heparin rechallenge  P:  Hold  IV heparin gtt Hold off  CTA chest - better risk proile with 1-2 year anticoagulation than higher risk of permanent  HD (wife says HD would be a nightmare for patient pschye) Full vent support VAP precautions     CARDIOVASCULAR Rt femoral CVL 8/18 >>8/20 Lt femoral A line 8/18 >> RIJ HD 8/20 >> LIJ  8/20 >>  A: Shock >> likely cardiogenic. RV failure with possible PE.-although venous doppler neg for DVT ? MI related  Hx of HTN, HLD. Atrial fib  Probable Acute MI (w/ ST elevation /elevation troponin )     - Off amio ? Date. Off pressors since 01/12/2015 AM.  Cath 8/06/25/14 OM branch occlusion per report.   P:  Hold outpt lipitor, bisoprolol, HCTZ, losartan, minoxidil  RENAL  #Baseline CKD>> baseline creatinine 1.14 from 12/17/14., BPH   #Admit   - ATN with metab acidosis and hyperkalemia - renal US neg  - CRRT started per renal 8/20  - stopped 01/15/15 and monitoring    -  Making very little urine. RF negative 01/14/15  P:   Await  auotimmune panel 01/14/15 Rest per renal     GASTROINTESTINAL A: Acute abdominal pain s/p laparotomy with wound vac 8/18.>wound vac change 8/19 d/t eviceration>to OR 8/21 with wound closure  Shock Liver >LFT tr down   - ABd tenderness + but decreased. Still with ileus and high NG output. Oozing from abd dressing resolved after heparin hold  P:   Post-op care per surgery TNA 01/15/2015 Protonix for SUP NGT to LIS per CCS Wound VAC with changes tTS TF start decision per CCS   HEMATOLOGIC A: Coagulopathy with elevated INR probably secnodary to shock liver >improving with INR tr down  Thrombocytopenia -  HITT  Negative 01/13/15 s/p on angiomax 01/13/15 - 01/14/15   - bleeding around oral cavity 8./24/16 resolved but ahd abd surface bleed 01/15/15  P:  Hold  HIV HEPARIN F/u CBC and INR   INFECTIOUS Culture - negative HIV negative 01/14/15 A: Sepsis with presumed abdominal source.>exp lap was neg .     ->Now w/ wound vac will need to cont abx for now . Follow Cx and PCT ,    P:   Day 7 vanc completed 01/14/15 Day 9  Zosynto continue   ENDOCRINE A: Hyperglycemia  P:   SSI   NEUROLOGIC A: Acute metabolic encephalopathy >> improved 8/18.>follows commands off sedation  Hx of chronic back pain, anxiety, depression.   - on  sedation gtt but has sginificant abdominal pain improved and follows commands  P:   RASS goal: -1  Wean sedation as able  Dilaudid gtt - ioncrease dose range Taper  versed gtt Start precedex gtt Use versed prn Hold outpt xanax, celexa, oxycodone, lyrica   FAMILY Updates - wife updated at bedside 01/12/2015 with presence of RN Darel Hong.No family 01/16/2015   IDT meet: due on LOS 7 days which is 01/16/15 - done 01/15/15 with RN Donah Driver at bedside. Full code. TRach./LTAC likely needed. THye are ok wih it. Wife understands that this will be a high mort situation and long drawn out course    SUMMARY  = no clear etioogy for presentation. Sepsis and PE likely causes. On angiomax but having oral bleding so stopping. On CRRT. Will ask renal about getting CTA rule out pe depending on renal prognosis - if no PE then no need for anticoagulation. Checking autoimmune    The patient is critically ill with multiple organ systems  failure and requires high complexity decision making for assessment and support, frequent evaluation and titration of therapies, application of advanced monitoring technologies and extensive interpretation of multiple databases.   Critical Care Time devoted to patient care services described in this note is  35  Minutes. This time reflects time of care of this signee Dr Kalman Shan. This critical care time does not reflect procedure time, or teaching time or supervisory time of PA/NP/Med student/Med Resident etc but could involve care discussion time    Dr. Kalman Shan, M.D., [facility].C.P Pulmonary and Critical Care Medicine Staff Physician [facility] [place] [facility] Pager: [phone], If no answer or between  15:00h - 7:00h: call [phone]  01/16/2015 1:04 PM

## 2015-01-16 NOTE — Progress Notes (Signed)
Patient ID: Jared [name], male   DOB: [birth year], 59 y.o.   MRN: [id number] 3 Days Post-Op  Subjective: Pt still intubated.  On  dilaudid drip.  Had bleeding from wound overnight and heparin gtt was stopped.  Apparently some bleeding from his mouth as well?  Objective: Vital signs in last 24 hours: Temp:  [98 F (36.7 C)-98.5 F (36.9 C)] 98.4 F (36.9 C) (08/26 0900) Pulse Rate:  [34-74] 73 (08/26 1000) Resp:  [9-29] 23 (08/26 1000) BP: (102-169)/(48-78) 133/64 mmHg (08/26 1000) SpO2:  [82 %-100 %] 99 % (08/26 1000) Arterial Line BP: (101-166)/(50-85) 148/69 mmHg (08/26 0800) FiO2 (%):  [40 %] 40 % (08/26 0821) Weight:  [128 kg (282 lb 3 oz)-128.5 kg (283 lb 4.7 oz)] 128 kg (282 lb 3 oz) (08/26 0700)    Intake/Output from previous day: 08/25 0701 - 08/26 0700 In: 1636.5 [I.V.:959.8; NG/GT:40; IV Piggyback:100; TPN:536.7] Out: 688 [Urine:38; Emesis/NG output:650] Intake/Output this shift: Total I/O In: 50 [I.V.:10; TPN:40] Out: 15 [Urine:15]  PE: Abd: soft, midline wound is clean and no bleeding really this morning.  Minimal raw surface ooze.  NGT with slightly less output (650cc/day)  Lab Results:   Recent Labs  01/15/15 0442 01/16/15 0340  WBC 10.2 10.2  HGB 11.7* 10.6*  HCT 33.9* 30.1*  PLT 90* 96*   BMET  Recent Labs  01/15/15 0442 01/16/15 0340  NA 136  137 134*  K 3.9  3.9 3.7  CL 102  103 101  CO2 21*  22 20*  GLUCOSE 81  79 109*  BUN 58*  58* 79*  CREATININE 6.51*  6.50* 9.20*  CALCIUM 7.9*  8.0* 7.8*   PT/INR No results for input(s): LABPROT, INR in the last 72 hours. CMP     Component Value Date/Time   NA 134* 01/16/2015 0340   K 3.7 01/16/2015 0340   CL 101 01/16/2015 0340   CO2 20* 01/16/2015 0340   GLUCOSE 109* 01/16/2015 0340   BUN 79* 01/16/2015 0340   CREATININE 9.20* 01/16/2015 0340   CREATININE 1.15 12/17/2014 1707   CALCIUM 7.8* 01/16/2015 0340   PROT 4.9* 01/15/2015 0442   ALBUMIN 1.8* 01/16/2015 0340   AST 93*  01/15/2015 0442   ALT 573* 01/15/2015 0442   ALKPHOS 65 01/15/2015 0442   BILITOT 5.3* 01/15/2015 0442   GFRNONAA 5* 01/16/2015 0340   GFRNONAA 68 12/17/2014 1707   GFRAA 6* 01/16/2015 0340   GFRAA 79 12/17/2014 1707   Lipase     Component Value Date/Time   LIPASE 80* 01/12/2015 0414       Studies/Results: Dg Chest Port 1 View  01/15/2015   CLINICAL DATA:  Repositioned endotracheal tube.  EXAM: PORTABLE CHEST - 1 VIEW  COMPARISON:  Earlier today.  FINDINGS: Stable enlarged cardiac silhouette. Endotracheal tube in satisfactory position. Bilateral jugular catheters are unchanged. Nasogastric tube extending into the stomach. Stable bibasilar opacities. Cervical spine fixation hardware is unchanged.  IMPRESSION: Stable cardiomegaly, bibasilar atelectasis, pneumonia and/or pleural fluid.   Electronically Signed   By: Beckie Salts M.D.   On: 01/15/2015 14:20   Dg Chest Port 1 View  01/15/2015   ADDENDUM REPORT: 01/15/2015 13:47  ADDENDUM: Please disregard this examination and report. They were created due to administrative error. Please refer to [id number] for the correct examination and report   Electronically Signed   By: Rad  Services   On: 01/15/2015 13:47   01/15/2015   CLINICAL DATA:  Extubation.  EXAM: PORTABLE CHEST -  1 VIEW  COMPARISON:  01/13/2015.  FINDINGS: Interim extubation and removal of NG tube and left IJ tube. Right IJ line noted with tip at cavoatrial junction. Cardiomegaly with continued improvement of pulmonary venous congestion and interstitial edema. Interval resolution of pleural effusions. No pneumothorax. Linear density along the right chest most likely represents a skin fold as lung markings are noted distal to this linear density. Mild gastric distention.  IMPRESSION: 1. Interim extubation and removal of NG tube and left IJ line. Right IJ line tip noted at cavoatrial junction. 2. Cardiomegaly with continued improvement of pulmonary vascular congestion and  interstitial edema. Interim resolution of pleural effusions. 3. Gastric distention.  Electronically Signed: By: Maisie Fus  Register On: 01/15/2015 07:02   Dg Chest Port 1 View  01/15/2015   CLINICAL DATA:  Respiratory distress.  EXAM: PORTABLE CHEST - 1 VIEW  COMPARISON:  Chest radiograph 01/13/2015  FINDINGS: ET tube terminates in the mid trachea. Internal jugular central venous catheter tips are stable in position. Enteric tube courses inferior to the diaphragm. Stable cardiomegaly. Layering moderate bilateral pleural effusions with underlying mid and lower lung hazy pulmonary opacities. No definite pneumothorax.  IMPRESSION: Moderate bilateral pleural effusions with underlying atelectasis and or edema.  Stable support apparatus.   Electronically Signed   By: Annia Belt M.D.   On: 01/15/2015 12:51    Anti-infectives: Anti-infectives    Start     Dose/Rate Route Frequency Ordered Stop   01/15/15 2000  piperacillin-tazobactam (ZOSYN) IVPB 2.25 g     2.25 g 100 mL/hr over 30 Minutes Intravenous 3 times per day 01/15/15 1511     01/11/15 1630  vancomycin (VANCOCIN) 1,250 mg in sodium chloride 0.9 % 250 mL IVPB  Status:  Discontinued     1,250 mg 166.7 mL/hr over 90 Minutes Intravenous Every 24 hours 01/11/15 1029 01/14/15 1150   01/10/15 1300  vancomycin (VANCOCIN) 1,750 mg in sodium chloride 0.9 % 500 mL IVPB  Status:  Discontinued     1,750 mg 250 mL/hr over 120 Minutes Intravenous Every 48 hours 01/08/15 1327 01/09/15 1537   01/10/15 1300  vancomycin (VANCOCIN) 1,250 mg in sodium chloride 0.9 % 250 mL IVPB  Status:  Discontinued     1,250 mg 166.7 mL/hr over 90 Minutes Intravenous Every 24 hours 01/10/15 0947 01/11/15 1029   01/10/15 1200  piperacillin-tazobactam (ZOSYN) IVPB 2.25 g  Status:  Discontinued     2.25 g 100 mL/hr over 30 Minutes Intravenous 4 times per day 01/10/15 0929 01/15/15 1511   01/09/15 1400  piperacillin-tazobactam (ZOSYN) IVPB 2.25 g  Status:  Discontinued     2.25  g 100 mL/hr over 30 Minutes Intravenous 3 times per day 01/09/15 0908 01/10/15 0929   01/09/15 1100  cefTRIAXone (ROCEPHIN) 1 g in dextrose 5 % 50 mL IVPB  Status:  Discontinued     1 g 100 mL/hr over 30 Minutes Intravenous Every 24 hours 01/08/15 1133 01/08/15 1327   01/09/15 1100  azithromycin (ZITHROMAX) 500 mg in dextrose 5 % 250 mL IVPB  Status:  Discontinued     500 mg 250 mL/hr over 60 Minutes Intravenous Every 24 hours 01/08/15 1133 01/08/15 1327   01/08/15 2200  piperacillin-tazobactam (ZOSYN) IVPB 3.375 g  Status:  Discontinued     3.375 g 12.5 mL/hr over 240 Minutes Intravenous 3 times per day 01/08/15 1327 01/09/15 0908   01/08/15 1400  vancomycin (VANCOCIN) 2,500 mg in sodium chloride 0.9 % 500 mL IVPB  2,500 mg 250 mL/hr over 120 Minutes Intravenous  Once 01/08/15 1327 01/08/15 1747   01/08/15 1330  piperacillin-tazobactam (ZOSYN) IVPB 3.375 g  Status:  Discontinued     3.375 g 100 mL/hr over 30 Minutes Intravenous  Once 01/08/15 1316 01/10/15 0929   01/08/15 1130  cefTRIAXone (ROCEPHIN) 1 g in dextrose 5 % 50 mL IVPB     1 g 100 mL/hr over 30 Minutes Intravenous  Once 01/08/15 1126 01/08/15 1220   01/08/15 1130  azithromycin (ZITHROMAX) 500 mg in dextrose 5 % 250 mL IVPB     500 mg 250 mL/hr over 60 Minutes Intravenous  Once 01/08/15 1126 01/08/15 1322       Assessment/Plan   POD 8, 5 s/p ex lap with open abdominal VAC placement and then closure -Ileus-con't NGT and await bowel function -TNA initiated -Con't dressing changes BID -TB increasing to 5.3, but other LFTs normalizing. Suspect this is still secondary to his overwhelming shock -patient rebled from his wound overnight with reinitiation of heparin gtt.  This was held and bleeding stopped.  As long as heparin is on, the patient is going to ooze from his wound.  I have stitched a vessel 2 days ago, but what he has right now is just raw surface ooze that stitches aren't going to help. -no intraabdominal source  of infection.  abx not needed for abdomen MMP -per primary service and other specialties DVT prophylaxis -SCDs, heparin held again due to bleeding  LOS: 8 days    Adlai Nieblas E 01/16/2015, 10:13 AM Pager: [phone]

## 2015-01-16 NOTE — Progress Notes (Signed)
eLink Physician-Brief Progress Note Patient Name: Jared [name] DOB: [birth year] MRN: [id number]   Date of Service  01/16/2015  HPI/Events of Note  Hypertension - BP 171/80 by cuff and 203/85 by A-line.   eICU Interventions  Will order: 1. Hydralazine 10 mg IV Q 4 hours PRN SBP > 160 or DBP > 100.     Intervention Category Intermediate Interventions: Hypertension - evaluation and management  Krishna Heuer Eugene 01/16/2015, 7:52 PM

## 2015-01-17 ENCOUNTER — Inpatient Hospital Stay (HOSPITAL_COMMUNITY): Payer: Medicare Other

## 2015-01-17 LAB — TRIGLYCERIDES: Triglycerides: 323 mg/dL — ABNORMAL HIGH (ref <150)

## 2015-01-17 LAB — HEPATITIS B SURFACE ANTIGEN: HEP B S AG: NEGATIVE

## 2015-01-17 LAB — CBC WITH DIFFERENTIAL/PLATELET
Basophils Absolute: 0 10*3/uL (ref 0.0–0.1)
Basophils Relative: 0 % (ref 0–1)
Eosinophils Absolute: 0.6 10*3/uL (ref 0.0–0.7)
Eosinophils Relative: 6 % — ABNORMAL HIGH (ref 0–5)
HEMATOCRIT: 33 % — AB (ref 39.0–52.0)
HEMOGLOBIN: 11.4 g/dL — AB (ref 13.0–17.0)
LYMPHS ABS: 1.3 10*3/uL (ref 0.7–4.0)
LYMPHS PCT: 12 % (ref 12–46)
MCH: 30 pg (ref 26.0–34.0)
MCHC: 34.5 g/dL (ref 30.0–36.0)
MCV: 86.8 fL (ref 78.0–100.0)
MONOS PCT: 7 % (ref 3–12)
Monocytes Absolute: 0.7 10*3/uL (ref 0.1–1.0)
NEUTROS ABS: 7.8 10*3/uL — AB (ref 1.7–7.7)
NEUTROS PCT: 75 % (ref 43–77)
Platelets: 122 10*3/uL — ABNORMAL LOW (ref 150–400)
RBC: 3.8 MIL/uL — AB (ref 4.22–5.81)
RDW: 14.8 % (ref 11.5–15.5)
WBC: 10.5 10*3/uL (ref 4.0–10.5)

## 2015-01-17 LAB — HEPATITIS B CORE ANTIBODY, TOTAL: HEP B C TOTAL AB: NEGATIVE

## 2015-01-17 LAB — RENAL FUNCTION PANEL
ANION GAP: 11 (ref 5–15)
Albumin: 2 g/dL — ABNORMAL LOW (ref 3.5–5.0)
BUN: 67 mg/dL — ABNORMAL HIGH (ref 6–20)
CHLORIDE: 104 mmol/L (ref 101–111)
CO2: 22 mmol/L (ref 22–32)
CREATININE: 8.59 mg/dL — AB (ref 0.61–1.24)
Calcium: 7.9 mg/dL — ABNORMAL LOW (ref 8.9–10.3)
GFR, EST AFRICAN AMERICAN: 7 mL/min — AB (ref >60)
GFR, EST NON AFRICAN AMERICAN: 6 mL/min — AB (ref >60)
Glucose, Bld: 128 mg/dL — ABNORMAL HIGH (ref 65–99)
POTASSIUM: 4.1 mmol/L (ref 3.5–5.1)
Phosphorus: 4.1 mg/dL (ref 2.5–4.6)
Sodium: 137 mmol/L (ref 135–145)

## 2015-01-17 LAB — GLUCOSE, CAPILLARY
GLUCOSE-CAPILLARY: 112 mg/dL — AB (ref 65–99)
GLUCOSE-CAPILLARY: 121 mg/dL — AB (ref 65–99)
GLUCOSE-CAPILLARY: 120 mg/dL — AB (ref 65–99)
Glucose-Capillary: 139 mg/dL — ABNORMAL HIGH (ref 65–99)

## 2015-01-17 LAB — SJOGRENS SYNDROME-A EXTRACTABLE NUCLEAR ANTIBODY

## 2015-01-17 LAB — SJOGRENS SYNDROME-B EXTRACTABLE NUCLEAR ANTIBODY: SSB (La) (ENA) Antibody, IgG: <0.2 AI (ref 0.0–0.9)

## 2015-01-17 LAB — MAGNESIUM: MAGNESIUM: 2.4 mg/dL (ref 1.7–2.4)

## 2015-01-17 LAB — HEPATITIS B SURFACE ANTIBODY,QUALITATIVE: Hep B S Ab: NONREACTIVE

## 2015-01-17 MED ORDER — HEPARIN SODIUM (PORCINE) 5000 UNIT/ML IJ SOLN
5000.0000 [IU] | Freq: Three times a day (TID) | INTRAMUSCULAR | Status: DC
  Administered 2015-01-17 – 2015-01-18 (×3): 5000 [IU] via SUBCUTANEOUS
  Filled 2015-01-17 (×5): qty 1

## 2015-01-17 MED ORDER — TRACE MINERALS CR-CU-MN-SE-ZN 10-1000-500-60 MCG/ML IV SOLN
INTRAVENOUS | Status: AC
  Administered 2015-01-17: 17:00:00 via INTRAVENOUS
  Filled 2015-01-17: qty 2400

## 2015-01-17 MED ORDER — HYDROMORPHONE HCL 1 MG/ML IJ SOLN
1.0000 mg | INTRAMUSCULAR | Status: DC | PRN
  Administered 2015-01-17: 4 mg via INTRAVENOUS
  Administered 2015-01-17: 2 mg via INTRAVENOUS
  Administered 2015-01-18: 4 mg via INTRAVENOUS
  Administered 2015-01-18 – 2015-01-19 (×3): 2 mg via INTRAVENOUS
  Administered 2015-01-19: 3 mg via INTRAVENOUS
  Administered 2015-01-19 – 2015-01-20 (×2): 2 mg via INTRAVENOUS
  Administered 2015-01-21: 1 mg via INTRAVENOUS
  Administered 2015-01-21: 2 mg via INTRAVENOUS
  Administered 2015-01-21 – 2015-01-23 (×4): 1 mg via INTRAVENOUS
  Filled 2015-01-17: qty 4
  Filled 2015-01-17: qty 2
  Filled 2015-01-17: qty 1
  Filled 2015-01-17 (×2): qty 2
  Filled 2015-01-17: qty 4
  Filled 2015-01-17 (×2): qty 1
  Filled 2015-01-17 (×2): qty 2
  Filled 2015-01-17 (×2): qty 1
  Filled 2015-01-17 (×2): qty 2
  Filled 2015-01-17: qty 1
  Filled 2015-01-17: qty 2

## 2015-01-17 NOTE — Progress Notes (Signed)
27mL Versed gtt wasted in the sink. Shanda Bumps, RN witnessed.

## 2015-01-17 NOTE — Progress Notes (Signed)
Patient ID: Jared [name], male   DOB: Dec 13, 1955, 59 y.o.   MRN: [id number] S:intubated but responsive O:BP 100/51 mmHg  Pulse 51  Temp(Src) 98.4 F (36.9 C) (Oral)  Resp 20  Wt 125.1 kg (275 lb 12.7 oz)  SpO2 98%  Intake/Output Summary (Last 24 hours) at 01/17/15 0936 Last data filed at 01/17/15 0900  Gross per 24 hour  Intake 3024.59 ml  Output   3065 ml  Net -40.41 ml   Intake/Output: I/O last 3 completed shifts: In: 3876 [I.V.:1537; NG/GT:130; IV Piggyback:250] Out: 3648 [Urine:98; Emesis/NG output:1550; Other:2000]  Intake/Output this shift:  Total I/O In: 416.4 [I.V.:167.4; TPN:249] Out: 10 [Urine:10] Weight change: -2 kg (-4 lb 6.6 oz) WUJ:WJXBJYNWGN ill-appearing WM intubated CVS:no rub Resp:occ rhonchi Abd:+BS, tender FAO:ZHYQM presacral and upper ext edema, no lower ext edema   Recent Labs Lab 01/11/15 0413  01/12/15 0414  01/13/15 0509 01/13/15 1550 01/14/15 0328 01/14/15 1600 01/15/15 0442 01/16/15 0340 01/16/15 1240 01/17/15 0400  NA 133*  < > 137  < > 137 136 134* 134* 136  137 134* 138 137  K 3.2*  < > 4.0  < > 4.1 4.1 3.9 4.0 3.9  3.9 3.7 4.1 4.1  CL 98*  < > 103  < > 102 104 101 100* 102  103 101 104 104  CO2 20*  < > 23  < > 21* 21*  22 20* 24 22  GLUCOSE 143*  < > 87  < > 114* 77 75 88 81  79 109* 125* 128*  BUN 67*  < > 59*  < > 54* 58* 56* 54* 58*  58* 79* 49* 67*  CREATININE 8.43*  < > 8.03*  < > 6.72* 6.93* 6.56* 6.00* 6.51*  6.50* 9.20* 6.65* 8.59*  ALBUMIN 2.0*  --  2.0*  < > 2.1* 1.9* 2.0* 2.0* 2.0*  1.9* 1.8*  --  2.0*  CALCIUM 6.7*  < > 7.0*  < > 7.6* 7.5* 7.8* 7.8* 7.9*  8.0* 7.8* 7.8* 7.9*  PHOS 3.4  < >  --   < > 4.6 4.0 4.0 3.8 4.3  4.3 5.4*  --  4.1  AST 1601*  --  686*  --   --   --   --   --  93*  --   --   --   ALT 2981*  --  2171*  --   --   --   --   --  573*  --   --   --   < > = values in this interval not displayed. Liver Function Tests:  Recent Labs Lab 01/11/15 0413 01/12/15 0414   01/15/15 0442 01/16/15 0340 01/17/15 0400  AST 1601* 686*  --  93*  --   --   ALT 2981* 2171*  --  573*  --   --   ALKPHOS 82 79  --  65  --   --   BILITOT 2.5* 3.5*  --  5.3*  --   --   PROT 3.9* 4.3*  --  4.9*  --   --   ALBUMIN 2.0* 2.0*  < > 2.0*  1.9* 1.8* 2.0*  < > = values in this interval not displayed.  Recent Labs Lab 01/10/15 2342 01/12/15 0414  LIPASE 43 80*  AMYLASE 198* 218*   No results for input(s): AMMONIA in the last 168 hours. CBC:  Recent Labs Lab 01/13/15 0509 01/14/15 0328 01/15/15 0442 01/16/15  0340 01/17/15 0400  WBC 11.1* 10.9* 10.2 10.2 10.5  NEUTROABS  --   --  8.3* 7.9* 7.8*  HGB 13.6 12.7* 11.7* 10.6* 11.4*  HCT 39.1 36.1* 33.9* 30.1* 33.0*  MCV 87.1 87.0 88.7 86.7 86.8  PLT 96* 86* 90* 96* 122*   Cardiac Enzymes:  Recent Labs Lab 01/11/15 1215 01/15/15 0442  CKTOTAL  --  717*  CKMB  --  11.9*  TROPONINI 4.71* 0.99*   CBG:  Recent Labs Lab 01/16/15 0822 01/16/15 1224 01/16/15 1544 01/16/15 1924 01/17/15 0814  GLUCAP 102* 117* 107* 151* 112*    Iron Studies: No results for input(s): IRON, TIBC, TRANSFERRIN, FERRITIN in the last 72 hours. Studies/Results: Dg Chest Port 1 View  01/17/2015   CLINICAL DATA:  Intubated patient.  EXAM: PORTABLE CHEST - 1 VIEW  COMPARISON:  01/15/2015.  FINDINGS: 0538 hours. Right IJ central venous catheter, left IJ central venous catheter, endotracheal tube and nasogastric tube appear unchanged. The nasogastric tube tip is not visualized. Bibasilar airspace opacities and probable bilateral pleural effusions are unchanged. There is no pneumothorax or definite edema. The heart remains enlarged. Postsurgical changes are noted status post lower cervical fusion.  IMPRESSION: Stable chest with persistent bibasilar airspace opacities and probable pleural effusions.   Electronically Signed   By: Carey Bullocks M.D.   On: 01/17/2015 09:30   Dg Chest Port 1 View  01/15/2015   CLINICAL DATA:  Repositioned  endotracheal tube.  EXAM: PORTABLE CHEST - 1 VIEW  COMPARISON:  Earlier today.  FINDINGS: Stable enlarged cardiac silhouette. Endotracheal tube in satisfactory position. Bilateral jugular catheters are unchanged. Nasogastric tube extending into the stomach. Stable bibasilar opacities. Cervical spine fixation hardware is unchanged.  IMPRESSION: Stable cardiomegaly, bibasilar atelectasis, pneumonia and/or pleural fluid.   Electronically Signed   By: Beckie Salts M.D.   On: 01/15/2015 14:20   Dg Chest Port 1 View  01/15/2015   CLINICAL DATA:  Respiratory distress.  EXAM: PORTABLE CHEST - 1 VIEW  COMPARISON:  Chest radiograph 01/13/2015  FINDINGS: ET tube terminates in the mid trachea. Internal jugular central venous catheter tips are stable in position. Enteric tube courses inferior to the diaphragm. Stable cardiomegaly. Layering moderate bilateral pleural effusions with underlying mid and lower lung hazy pulmonary opacities. No definite pneumothorax.  IMPRESSION: Moderate bilateral pleural effusions with underlying atelectasis and or edema.  Stable support apparatus.   Electronically Signed   By: Annia Belt M.D.   On: 01/15/2015 12:51   . antiseptic oral rinse  7 mL Mouth Rinse QID  . aspirin  300 mg Rectal Daily  . chlorhexidine gluconate  15 mL Mouth Rinse BID  . insulin aspart  0-9 Units Subcutaneous 6 times per day  . pantoprazole (PROTONIX) IV  40 mg Intravenous QHS  . piperacillin-tazobactam (ZOSYN)  IV  2.25 g Intravenous 3 times per day  . sodium chloride  10 mL Intravenous Q12H    BMET    Component Value Date/Time   NA 137 01/17/2015 0400   K 4.1 01/17/2015 0400   CL 104 01/17/2015 0400   CO2 22 01/17/2015 0400   GLUCOSE 128* 01/17/2015 0400   BUN 67* 01/17/2015 0400   CREATININE 8.59* 01/17/2015 0400   CREATININE 1.15 12/17/2014 1707   CALCIUM 7.9* 01/17/2015 0400   GFRNONAA 6* 01/17/2015 0400   GFRNONAA 68 12/17/2014 1707   GFRAA 7* 01/17/2015 0400   GFRAA 79 12/17/2014 1707    CBC    Component Value Date/Time  WBC 10.5 01/17/2015 0400   RBC 3.80* 01/17/2015 0400   HGB 11.4* 01/17/2015 0400   HCT 33.0* 01/17/2015 0400   PLT 122* 01/17/2015 0400   MCV 86.8 01/17/2015 0400   MCH 30.0 01/17/2015 0400   MCHC 34.5 01/17/2015 0400   RDW 14.8 01/17/2015 0400   LYMPHSABS 1.3 01/17/2015 0400   MONOABS 0.7 01/17/2015 0400   EOSABS 0.6 01/17/2015 0400   BASOSABS 0.0 01/17/2015 0400     Assessment/Plan:  1. AKI/CKD stage 2, oliguric presumably due to septic shock. CVVHD stopped 01/14/15 and first IHD 01/16/15. 1. Plan for HD MWF while needed and if he can tolerate from hemodynamic standpoint 2. VDRF- per PCCM 3. SIRS- possible PE (RA and RV dilated, RV hypocontractile on ECHO). CT angio on hold for now (concerns for renal recovery as well as IV access). Currently off of pressors. 4. CAD- cath revealed occluded OM, cardiology following 5. Shock liver- starting to improve 6. S/p exploratory lap and fascia closure now s/p wound vac 7. Metabolic acidosis- improved with CVVHD 8. 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  Improving off heparin 9. Hypoalbuminemia- per primary svc  Erika Slaby A

## 2015-01-17 NOTE — Progress Notes (Signed)
PULMONARY / CRITICAL CARE MEDICINE   Name: Jared [name] MRN: [id number] DOB: Sep 23, 1955    ADMISSION DATE:  01/08/2015 CONSULTATION DATE:  8/18  REFERRING MD :  EDP Dr Criss Alvine  CHIEF COMPLAINT:  Found down  INITIAL PRESENTATION:   59 yo male had severe abdominal pain, and then found unresponsive at home.  In ER he was in shock, cyanotic, with lactic acidosis, and hyperkalemia.  STUDIES:  8/18 Echo >> EF 45 to 50%, mod RV dilation, mod/severe RV dysfx, PAS 47 mmHg 8/18 Renal u/s >> b/l renal echogenicity  SIGNIFICANT EVENTS: 8/18 Admit, CCS, cardiology consulted; brief PEA arrest; laparotomy with placement of wound vac; started heparin gtt for presumed PE 8/19 Renal consulted. Duplex LE negative for DVT 8/20 CRRT  8/21 OR for abd wound closure    8/21 :  To OR this am with abd wound closure , did well . CRRT started yesterday , no change in scr or UOP.  Bradycardia overnight with Amio stopped. HR improved but remains in upper 40s. Remains on pressors with Levo and Vaso , decreased demands    01/12/15; For cath later 01/12/2015 per Dr Jomarie Longs. On pressors. Anuric. On CRRT. Off pressors. Improved Shock Liver. On fent gtt and heparing gtt. 40% fio2   01/13/15: Cath delayed to 01/13/2015. Off pressors. On CRRT. On fent gtt . Off heparin gtt -> due to dropping platelet. HITT panel sent. RN reports significant abd tenderness. CCS reports patient has ileys    01/14/15: CCS reports ongoing ileus but planning TF in 1-2days. Offpressors. Ongong CRRT. Cath relatively clean except occlusion of OM branch - not felt to be primary cause of current illness. This is per renal note. No cath note seen in EMR.  On dilaudid and diprivan gtt for sedation.    Primarhy dx stll unclear and is on empiric Bivalirudin (angiomax) for PE - HITT panel pending. DUplex LE - repeat negative for DVT . RN reports bleeding around oral cavity. Platelets at 86K  01/15/15 - ANgiomax on hold x 24h due to oral bleeding.  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 Platelt around 90K. TNA being initiated 01/15/2015 due to ileus. Anuria with minimal urine otuput. CTA did not happen yesterday due to PIV issue. Today d/w renal - risk of long term anticoagulation for empiric PE better than long term HD. SEdated with diprivan and dilaudid 4mg   NOTED: CXR reports he is extubated but there are no bedside issues on vent   01/16/15: Bleeding around abd wound - surface bled and IV heparin held yesterday. Still hgih NG returns and on TNA. On dilaudid and versed gtt. Lot of pain and agitation. Tolerating intemittent HD - 2L volume removal. Making very little urine    SUBJECTIVE/OVERNIGHT/INTERVAL HX 01/17/15 - hypertensive - on hydralazine prn. Got HD . Anuric. Did get versed for agitation during dressing change. Being weaned off dilaudid gtt by RN. Doing SBT Following some commands. NG output improved - CCS plan continue TPN. Not on pressors  VITAL SIGNS: Temp:  [97.8 F (36.6 C)-98.9 F (37.2 C)] 98.4 F (36.9 C) (08/27 0812) Pulse Rate:  [51-74] 51 (08/27 0915) Resp:  [20-25] 20 (08/27 0915) BP: (92-200)/(42-112) 100/51 mmHg (08/27 0915) SpO2:  [95 %-100 %] 98 % (08/27 0915) Arterial Line BP: (93-203)/(40-85) 112/51 mmHg (08/27 0915) FiO2 (%):  [40 %] 40 % (08/27 0900) Weight:  [125.1 kg (275 lb 12.7 oz)-126.5 kg (278 lb 14.1 oz)] 125.1 kg (275 lb 12.7 oz) (08/27 0500) HEMODYNAMICS:   VENTILATOR  SETTINGS: Vent Mode:  [-] CPAP;PSV FiO2 (%):  [40 %] 40 % Set Rate:  [25 bmp] 25 bmp Vt Set:  [670 mL] 670 mL PEEP:  [5 cmH20] 5 cmH20 Pressure Support:  [5 cmH20] 5 cmH20 Plateau Pressure:  [21 cmH20-22 cmH20] 21 cmH20 INTAKE / OUTPUT:  Intake/Output Summary (Last 24 hours) at 01/17/15 0944 Last data filed at 01/17/15 0900  Gross per 24 hour  Intake 3024.59 ml  Output   3065 ml  Net -40.41 ml    Total I/O In: 416.4 [I.V.:167.4; TPN:249] Out: 10 [Urine:10]  I/O last 3 completed shifts: In: 3876 [I.V.:1537;  NG/GT:130; IV Piggyback:250] Out: 3648 [Urine:98; Emesis/NG output:1550; Other:2000]   PHYSICAL EXAMINATION: General: critically ill appearing on vent  Neuro: sedated moves extremities. RASS -3 on dilaudid and versed gtt - followed commands and agitated per RN during WUA HEENT: ETT in place Cardiovascular: SB , no murmur Lungs: faint basilar crackles Abdomen: mild distention, wound vac in place, tenderness diffusely + but improved siginifcantly Musculoskeletal: no edema  LABS:  PULMONARY  Recent Labs Lab 01/10/15 1615 01/11/15 1131  PHART  --  7.439  PCO2ART  --  30.4*  PO2ART  --  79.8*  HCO3  --  20.3  TCO2  --  21.2  O2SAT 76.4 95.1    CBC  Recent Labs Lab 01/15/15 0442 01/16/15 0340 01/17/15 0400  HGB 11.7* 10.6* 11.4*  HCT 33.9* 30.1* 33.0*  WBC 10.2 10.2 10.5  PLT 90* 96* 122*    COAGULATION  Recent Labs Lab 01/11/15 0413 01/12/15 0801  INR 1.68* 1.34    CARDIAC    Recent Labs Lab 01/11/15 1215 01/15/15 0442  TROPONINI 4.71* 0.99*   No results for input(s): PROBNP in the last 168 hours.   CHEMISTRY  Recent Labs Lab 01/13/15 0509  01/14/15 0328 01/14/15 1600 01/15/15 0442 01/16/15 0340 01/16/15 1240 01/17/15 0400  NA 137  < > 134* 134* 136  137 134* 138 137  K 4.1  < > 3.9 4.0 3.9  3.9 3.7 4.1 4.1  CL 102  < > 101 100* 102  103 101 104 104  CO2 22  < > 23 21* 21*  22 20* 24 22  GLUCOSE 114*  < > 75 88 81  79 109* 125* 128*  BUN 54*  < > 56* 54* 58*  58* 79* 49* 67*  CREATININE 6.72*  < > 6.56* 6.00* 6.51*  6.50* 9.20* 6.65* 8.59*  CALCIUM 7.6*  < > 7.8* 7.8* 7.9*  8.0* 7.8* 7.8* 7.9*  MG 2.5*  --  2.6*  --  2.9* 2.9*  --  2.4  PHOS 4.6  < > 4.0 3.8 4.3  4.3 5.4*  --  4.1  < > = values in this interval not displayed. Estimated Creatinine Clearance: 12.8 mL/min (by C-G formula based on Cr of 8.59).   LIVER  Recent Labs Lab 01/11/15 0413 01/12/15 0414 01/12/15 0801  01/14/15 0328 01/14/15 1600 01/15/15 0442  01/16/15 0340 01/17/15 0400  AST 1601* 686*  --   --   --   --  93*  --   --   ALT 2981* 2171*  --   --   --   --  573*  --   --   ALKPHOS 82 79  --   --   --   --  65  --   --   BILITOT 2.5* 3.5*  --   --   --   --  5.3*  --   --   PROT 3.9* 4.3*  --   --   --   --  4.9*  --   --   ALBUMIN 2.0* 2.0*  --   < > 2.0* 2.0* 2.0*  1.9* 1.8* 2.0*  INR 1.68*  --  1.34  --   --   --   --   --   --   < > = values in this interval not displayed.   INFECTIOUS  Recent Labs Lab 01/11/15 0413 01/15/15 0145  LATICACIDVEN  --  0.8  PROCALCITON 25.12  --      ENDOCRINE CBG (last 3)   Recent Labs  01/16/15 1544 01/16/15 1924 01/17/15 0814  GLUCAP 107* 151* 112*         IMAGING x48h  - image(s) personally visualized  -   highlighted in bold Dg Chest Port 1 View  01/17/2015   CLINICAL DATA:  Intubated patient.  EXAM: PORTABLE CHEST - 1 VIEW  COMPARISON:  01/15/2015.  FINDINGS: 0538 hours. Right IJ central venous catheter, left IJ central venous catheter, endotracheal tube and nasogastric tube appear unchanged. The nasogastric tube tip is not visualized. Bibasilar airspace opacities and probable bilateral pleural effusions are unchanged. There is no pneumothorax or definite edema. The heart remains enlarged. Postsurgical changes are noted status post lower cervical fusion.  IMPRESSION: Stable chest with persistent bibasilar airspace opacities and probable pleural effusions.   Electronically Signed   By: Carey Bullocks M.D.   On: 01/17/2015 09:30   Dg Chest Port 1 View  01/15/2015   CLINICAL DATA:  Repositioned endotracheal tube.  EXAM: PORTABLE CHEST - 1 VIEW  COMPARISON:  Earlier today.  FINDINGS: Stable enlarged cardiac silhouette. Endotracheal tube in satisfactory position. Bilateral jugular catheters are unchanged. Nasogastric tube extending into the stomach. Stable bibasilar opacities. Cervical spine fixation hardware is unchanged.  IMPRESSION: Stable cardiomegaly, bibasilar  atelectasis, pneumonia and/or pleural fluid.   Electronically Signed   By: Beckie Salts M.D.   On: 01/15/2015 14:20   Dg Chest Port 1 View  01/15/2015   CLINICAL DATA:  Respiratory distress.  EXAM: PORTABLE CHEST - 1 VIEW  COMPARISON:  Chest radiograph 01/13/2015  FINDINGS: ET tube terminates in the mid trachea. Internal jugular central venous catheter tips are stable in position. Enteric tube courses inferior to the diaphragm. Stable cardiomegaly. Layering moderate bilateral pleural effusions with underlying mid and lower lung hazy pulmonary opacities. No definite pneumothorax.  IMPRESSION: Moderate bilateral pleural effusions with underlying atelectasis and or edema.  Stable support apparatus.   Electronically Signed   By: Annia Belt M.D.   On: 01/15/2015 12:51         ASSESSMENT / PLAN:  PULMONARY ETT 8/18 >>> A: #hx of OSA  #At admit Acute respiratory failure 2nd to presumed PE, lactic acidosis.    - - HITT Ab negative 01/13/15  Duplex LE negative for DVT 01/09/15 and  01/13/15   - doing sbt but no extubation 01/17/2015 due to mental status   - Local abd wound bleeding 01/15/15 in setting of heparin rechallenge and heparin IV  For presumed PE on hold  P:   Hold  IV heparin gtt through 01/18/15 and then rechallenge. Till then SQ heparin proph diose Hold off  CTA chest - better risk proile with 1-2 year anticoagulation than higher risk of permanent  HD (wife says HD would be a nightmare for patient pschye) Full vent support - no extubation but ok  for SBT 01/17/2015 VAP precautions     CARDIOVASCULAR Rt femoral CVL 8/18 >>8/20 Lt femoral A line 8/18 >> RIJ HD 8/20 >> LIJ 8/20 >>  A: #Baseline -  Hx of HTN, HLD.  #Admit  Shock >> likely RV failure with possible PE.-although venous doppler neg for DVT  X 2   - Cath 8/06/25/14 OM branch occlusion per report and unlikely cause of admission   - resolve and off pressors sine 01/12/15  #Hospital course  -  Complicated by A Fib.  Resolved. Off amio since 01/11/15  #Current  -  Hypertensive an needing hydralazine prn    P:  Hold outpt lipitor, bisoprolol, HCTZ, losartan, minoxidil Hydralazine prn  RENAL  #Baseline CKD>> baseline creatinine 1.14 from 12/17/14., BPH   #Admit   - ATN with metab acidosis and hyperkalemia - renal US neg  - CRRT started per renal 8/20  - stopped 01/15/15   - on Int HD since 01/16/15 ; Making very little urine. RF and pan autpimmune negative 01/14/15  P:    per renal     GASTROINTESTINAL A: Acute abdominal pain s/p laparotomy with wound vac 8/18.>wound vac change 8/19 d/t eviceration>to OR 8/21 with wound closure  Shock Liver >LFT tr down   - ABd tenderness + but decreased significantly.  Still with ileus and high NG output but improved -  Oozing from abd dressing resolved after heparin hold 01/15/15  P:   Post-op care per surgery TNA  Since 01/15/2015 Protonix for SUP NGT to LIS per CCS Wound VAC with changes tTS TF start decision per CCS   HEMATOLOGIC A: Coagulopathy with elevated INR probably secnodary to shock liver >improving with INR tr down  Thrombocytopenia -  HITT  Negative 01/13/15 s/p on angiomax 01/13/15 - 01/14/15   - bleeding around oral cavity 8./24/16 resolved but ahd abd surface bleed 01/15/15 - also resolved after holding anticoag  P:  Hold  HIV HEPARIN through 01/18/15 F/u CBC and INR   INFECTIOUS Culture - negative HIV negative 01/14/15 A: Sepsis with presumed abdominal source.>exp lap was neg .     ->Now w/ wound vac will need to cont abx for now . Follow Cx and PCT ,    P:   Day 7 vanc completed 01/14/15 Day 10  Zosynto continue  - need surgery inpuyt about zosyn stop date  ENDOCRINE A: Hyperglycemia  P:   SSI   NEUROLOGIC A: Acute metabolic encephalopathy >> improved 8/18.>follows commands off sedation  Hx of chronic back pain, anxiety, depression.   - on sedation gtt but tolerating wean off dilaudid gtt   P:   RASS goal: -1   Wean sedation as able  Dc Dilaudid gtt  Do dilaudid prn Continue precedex gtt Use versed prn Hold outpt xanax, celexa, oxycodone, lyrica   FAMILY Updates - wife updated at bedside 01/12/2015 with presence of RN Darel Hong.No family 01/16/2015 and 01/17/2015    IDT meet: due on LOS 7 days which is 01/16/15 - done 01/15/15 with RN Donah Driver at bedside. Full code. TRach./LTAC likely needed. THye are ok wih it. Wife understands that this will be a high mort situation and long drawn out course    SUMMARY  = no clear etioogy for presentation. Sepsis and PE likely causes. On HD now. Slowly waking up. Will beo n dilaudid prn and precedex gtt.  Holding IV heparin through 01/18/15 SBT as tolerated , no extubation.     The patient is critically ill with  multiple organ systems failure and requires high complexity decision making for assessment and support, frequent evaluation and titration of therapies, application of advanced monitoring technologies and extensive interpretation of multiple databases.   Critical Care Time devoted to patient care services described in this note is  35  Minutes. This time reflects time of care of this signee Dr Kalman Shan. This critical care time does not reflect procedure time, or teaching time or supervisory time of PA/NP/Med student/Med Resident etc but could involve care discussion time    Dr. Kalman Shan, M.D., [facility].C.P Pulmonary and Critical Care Medicine Staff Physician [facility] Pager: [phone], If no answer or between  15:00h - 7:00h: call [phone]  01/17/2015 9:44 AM

## 2015-01-17 NOTE — Progress Notes (Signed)
4 Days Post-Op  Subjective: Awake on vent despite dilaudid and precedex drips. Seems more comfortable today  Objective: Vital signs in last 24 hours: Temp:  [97.8 F (36.6 C)-98.9 F (37.2 C)] 97.8 F (36.6 C) (08/27 0400) Pulse Rate:  [53-74] 56 (08/27 0600) Resp:  [21-26] 21 (08/27 0600) BP: (94-200)/(43-112) 129/62 mmHg (08/27 0600) SpO2:  [95 %-100 %] 97 % (08/27 0600) Arterial Line BP: (93-203)/(40-85) 178/73 mmHg (08/27 0630) FiO2 (%):  [40 %] 40 % (08/27 0752) Weight:  [125.1 kg (275 lb 12.7 oz)-126.5 kg (278 lb 14.1 oz)] 125.1 kg (275 lb 12.7 oz) (08/27 0500)    Intake/Output from previous day: 08/26 0701 - 08/27 0700 In: 2748.2 [I.V.:1029.2; NG/GT:90; IV Piggyback:150; TPN:1479] Out: 3070 [Urine:70; Emesis/NG output:1000] Intake/Output this shift:    Resp: clear to auscultation bilaterally and on vent Cardio: regular rate and rhythm GI: soft, tender near incision. dressing with small amout of drainage  Lab Results:   Recent Labs  01/16/15 0340 01/17/15 0400  WBC 10.2 10.5  HGB 10.6* 11.4*  HCT 30.1* 33.0*  PLT 96* 122*   BMET  Recent Labs  01/16/15 1240 01/17/15 0400  NA 138 137  K 4.1 4.1  CL 104 104  CO2 24 22  GLUCOSE 125* 128*  BUN 49* 67*  CREATININE 6.65* 8.59*  CALCIUM 7.8* 7.9*   PT/INR No results for input(s): LABPROT, INR in the last 72 hours. ABG No results for input(s): PHART, HCO3 in the last 72 hours.  Invalid input(s): PCO2, PO2  Studies/Results: Dg Chest Port 1 View  01/15/2015   CLINICAL DATA:  Repositioned endotracheal tube.  EXAM: PORTABLE CHEST - 1 VIEW  COMPARISON:  Earlier today.  FINDINGS: Stable enlarged cardiac silhouette. Endotracheal tube in satisfactory position. Bilateral jugular catheters are unchanged. Nasogastric tube extending into the stomach. Stable bibasilar opacities. Cervical spine fixation hardware is unchanged.  IMPRESSION: Stable cardiomegaly, bibasilar atelectasis, pneumonia and/or pleural fluid.    Electronically Signed   By: Beckie Salts M.D.   On: 01/15/2015 14:20   Dg Chest Port 1 View  01/15/2015   CLINICAL DATA:  Respiratory distress.  EXAM: PORTABLE CHEST - 1 VIEW  COMPARISON:  Chest radiograph 01/13/2015  FINDINGS: ET tube terminates in the mid trachea. Internal jugular central venous catheter tips are stable in position. Enteric tube courses inferior to the diaphragm. Stable cardiomegaly. Layering moderate bilateral pleural effusions with underlying mid and lower lung hazy pulmonary opacities. No definite pneumothorax.  IMPRESSION: Moderate bilateral pleural effusions with underlying atelectasis and or edema.  Stable support apparatus.   Electronically Signed   By: Annia Belt M.D.   On: 01/15/2015 12:51    Anti-infectives: Anti-infectives    Start     Dose/Rate Route Frequency Ordered Stop   01/15/15 2000  piperacillin-tazobactam (ZOSYN) IVPB 2.25 g     2.25 g 100 mL/hr over 30 Minutes Intravenous 3 times per day 01/15/15 1511     01/11/15 1630  vancomycin (VANCOCIN) 1,250 mg in sodium chloride 0.9 % 250 mL IVPB  Status:  Discontinued     1,250 mg 166.7 mL/hr over 90 Minutes Intravenous Every 24 hours 01/11/15 1029 01/14/15 1150   01/10/15 1300  vancomycin (VANCOCIN) 1,750 mg in sodium chloride 0.9 % 500 mL IVPB  Status:  Discontinued     1,750 mg 250 mL/hr over 120 Minutes Intravenous Every 48 hours 01/08/15 1327 01/09/15 1537   01/10/15 1300  vancomycin (VANCOCIN) 1,250 mg in sodium chloride 0.9 % 250 mL IVPB  Status:  Discontinued     1,250 mg 166.7 mL/hr over 90 Minutes Intravenous Every 24 hours 01/10/15 0947 01/11/15 1029   01/10/15 1200  piperacillin-tazobactam (ZOSYN) IVPB 2.25 g  Status:  Discontinued     2.25 g 100 mL/hr over 30 Minutes Intravenous 4 times per day 01/10/15 0929 01/15/15 1511   01/09/15 1400  piperacillin-tazobactam (ZOSYN) IVPB 2.25 g  Status:  Discontinued     2.25 g 100 mL/hr over 30 Minutes Intravenous 3 times per day 01/09/15 0908 01/10/15 0929    01/09/15 1100  cefTRIAXone (ROCEPHIN) 1 g in dextrose 5 % 50 mL IVPB  Status:  Discontinued     1 g 100 mL/hr over 30 Minutes Intravenous Every 24 hours 01/08/15 1133 01/08/15 1327   01/09/15 1100  azithromycin (ZITHROMAX) 500 mg in dextrose 5 % 250 mL IVPB  Status:  Discontinued     500 mg 250 mL/hr over 60 Minutes Intravenous Every 24 hours 01/08/15 1133 01/08/15 1327   01/08/15 2200  piperacillin-tazobactam (ZOSYN) IVPB 3.375 g  Status:  Discontinued     3.375 g 12.5 mL/hr over 240 Minutes Intravenous 3 times per day 01/08/15 1327 01/09/15 0908   01/08/15 1400  vancomycin (VANCOCIN) 2,500 mg in sodium chloride 0.9 % 500 mL IVPB     2,500 mg 250 mL/hr over 120 Minutes Intravenous  Once 01/08/15 1327 01/08/15 1747   01/08/15 1330  piperacillin-tazobactam (ZOSYN) IVPB 3.375 g  Status:  Discontinued     3.375 g 100 mL/hr over 30 Minutes Intravenous  Once 01/08/15 1316 01/10/15 0929   01/08/15 1130  cefTRIAXone (ROCEPHIN) 1 g in dextrose 5 % 50 mL IVPB     1 g 100 mL/hr over 30 Minutes Intravenous  Once 01/08/15 1126 01/08/15 1220   01/08/15 1130  azithromycin (ZITHROMAX) 500 mg in dextrose 5 % 250 mL IVPB     500 mg 250 mL/hr over 60 Minutes Intravenous  Once 01/08/15 1126 01/08/15 1322      Assessment/Plan: s/p Procedure(s): Left Heart Cath and Coronary Angiography (N/A) Continue tpn for nutrition support until ileus resolves  Continue dressing changes VDRF per CCM  LOS: 9 days    TOTH III,PAUL S 01/17/2015

## 2015-01-17 NOTE — Progress Notes (Signed)
PARENTERAL NUTRITION CONSULT NOTE - Follow-up  Pharmacy Consult for TPN Indication: Prolonged Ileus  Allergies  Allergen Reactions  . Morphine And Related Anaphylaxis and Shortness Of Breath  . Montelukast Other (See Comments)    unknown  . Neurontin [Gabapentin] Other (See Comments)    delusional  . Wellbutrin [Bupropion] Palpitations    Insomnia    Patient Measurements: Weight: 275 lb 12.7 oz (125.1 kg) Usual Weight: 138 kg on admission (8/20)  Vital Signs: Temp: 97.8 F (36.6 C) (08/27 0400) Temp Source: Axillary (08/27 0400) BP: 129/62 mmHg (08/27 0600) Pulse Rate: 56 (08/27 0600) Intake/Output from previous day: 08/26 0701 - 08/27 0700 In: 2748.2 [I.V.:1029.2; NG/GT:90; IV Piggyback:150; TPN:1479] Out: 3070 [Urine:70; Emesis/NG output:1000] Intake/Output from this shift:    Labs:  Recent Labs  01/15/15 0442 01/16/15 0340 01/17/15 0400  WBC 10.2 10.2 10.5  HGB 11.7* 10.6* 11.4*  HCT 33.9* 30.1* 33.0*  PLT 90* 96* 122*     Recent Labs  01/15/15 0442 01/16/15 0340 01/16/15 1240 01/17/15 0400  NA 136  137 134* 138 137  K 3.9  3.9 3.7 4.1 4.1  CL 102  103 101 104 104  CO2 21*  22 20* 24 22  GLUCOSE 81  79 109* 125* 128*  BUN 58*  58* 79* 49* 67*  CREATININE 6.51*  6.50* 9.20* 6.65* 8.59*  CALCIUM 7.9*  8.0* 7.8* 7.8* 7.9*  MG 2.9* 2.9*  --  2.4  PHOS 4.3  4.3 5.4*  --  4.1  PROT 4.9*  --   --   --   ALBUMIN 2.0*  1.9* 1.8*  --  2.0*  AST 93*  --   --   --   ALT 573*  --   --   --   ALKPHOS 65  --   --   --   BILITOT 5.3*  --   --   --   PREALBUMIN 12.8*  --   --   --   TRIG  --   --   --  323*   Estimated Creatinine Clearance: 12.8 mL/min (by C-G formula based on Cr of 8.59).    Recent Labs  01/16/15 1224 01/16/15 1544 01/16/15 1924  GLUCAP 117* 107* 151*   Insulin Requirements in the past 24 hours:  3 units SSI since TPN start last night  Current Nutrition:  Clinimix 5/15 at 11ml/hr  Assessment: 59 yo M presents on  8/18 after being found down at home. Was responsive to Narcan and then found to be hypotensive with elevated WBC count in ED. Went to surgery on 8/18 for ex lap but everything looked ok. Being treated for acute resp failure, RV failure with presumed PE,  ATN with metabolic acidosis. Now day #8 post-op with prolonged ileus. Pharmacy consulted to start TPN.  Surgeries/Procedures: Ex lap on 8/18 where no ischemic bowel or perforation was found. 8/21 went back to OR for closure of abdominal fascia and placement of wound VAC. GI: Prolonged Ileus. Surgery would like to start TFs soon but still having high output from NGT ( yesterday). PPI Endo: CBGs well controlled with minimal SSI Lytes: Na 137, K 4.1, Phos 4.1, Mg 2.4, CoCa 9.5 (phos x Ca = 39). (Goal K > 4.0 and Mg > 2.0)  Renal: Was on CRRT but stopped 8/24. Started on iHD 8/26 prn.  UOP < yesterday. Pulm: Intubated 8/18. FiO2 at 40%. Cards: RV failure with presumed PE. 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 Now stopped due to bleeding. CT on hold due to concerns for renal recovery. HIT panel negative. Hgb 11.4, plts low at 122. aspirin Hepatobil: LFTs elevated but trending down. Albumin low at 1.8. Prealbumin low at 12.8. Tbili increasing to 5.3 (no jaundice), trigs 323 Neuro: Sedated on dilaudid/precedex gtts  ID: Sepsis with presumed abdmonial source. Afebrile, WBC wnl.  8/18 Blood cx > 8/18 Urine cx >  8/18 Vancomycin >> 8/23 8/18 Zosyn >>  Best Practices: SCDs TPN Access: 8/20 - CVC triple lumen TPN start date: 8/25 >>  Nutritional Goals: per RD on 8/25 KCal: 1610-9604 Protein: >175 g Fluid: 2 L  Plan:  - Increase clinimix 5/15 (no electrolytes) to 168ml/hr - provides 120gm protein (69% goal) and 1414 kcal (100% goal) - will be unable to meet protein requirements d/t limitations of pre-mixed Clinimix bags - Hold IVFE for first 7 days for ICU patients per ASPEN guidelines (Day #3) - Continue MVI and  trace elements in TPN (monitor for jaundice) - Continue sensitive SSI  - F/u AM BMET, Mg, Phos  Emireth Cockerham, Drake Leach 01/17/2015,7:45 AM

## 2015-01-18 ENCOUNTER — Inpatient Hospital Stay (HOSPITAL_COMMUNITY): Payer: Medicare Other

## 2015-01-18 DIAGNOSIS — G9340 Encephalopathy, unspecified: Secondary | ICD-10-CM | POA: Diagnosis present

## 2015-01-18 DIAGNOSIS — G8929 Other chronic pain: Secondary | ICD-10-CM | POA: Diagnosis present

## 2015-01-18 DIAGNOSIS — M549 Dorsalgia, unspecified: Secondary | ICD-10-CM

## 2015-01-18 LAB — GLUCOSE, CAPILLARY
GLUCOSE-CAPILLARY: 96 mg/dL (ref 65–99)
GLUCOSE-CAPILLARY: 125 mg/dL — AB (ref 65–99)
GLUCOSE-CAPILLARY: 117 mg/dL — AB (ref 65–99)
Glucose-Capillary: 108 mg/dL — ABNORMAL HIGH (ref 65–99)
Glucose-Capillary: 110 mg/dL — ABNORMAL HIGH (ref 65–99)
Glucose-Capillary: 120 mg/dL — ABNORMAL HIGH (ref 65–99)

## 2015-01-18 LAB — RENAL FUNCTION PANEL
ALBUMIN: 1.8 g/dL — AB (ref 3.5–5.0)
ANION GAP: 12 (ref 5–15)
BUN: 91 mg/dL — AB (ref 6–20)
CHLORIDE: 103 mmol/L (ref 101–111)
CO2: 19 mmol/L — ABNORMAL LOW (ref 22–32)
Calcium: 8.2 mg/dL — ABNORMAL LOW (ref 8.9–10.3)
Creatinine, Ser: 11.19 mg/dL — ABNORMAL HIGH (ref 0.61–1.24)
GFR, EST AFRICAN AMERICAN: 5 mL/min — AB (ref >60)
GFR, EST NON AFRICAN AMERICAN: 4 mL/min — AB (ref >60)
Glucose, Bld: 124 mg/dL — ABNORMAL HIGH (ref 65–99)
PHOSPHORUS: 6.1 mg/dL — AB (ref 2.5–4.6)
POTASSIUM: 4.6 mmol/L (ref 3.5–5.1)
Sodium: 134 mmol/L — ABNORMAL LOW (ref 135–145)

## 2015-01-18 LAB — CBC WITH DIFFERENTIAL/PLATELET
BASOS ABS: 0 10*3/uL (ref 0.0–0.1)
Basophils Relative: 0 % (ref 0–1)
Eosinophils Absolute: 0.6 10*3/uL (ref 0.0–0.7)
Eosinophils Relative: 5 % (ref 0–5)
HEMATOCRIT: 31.4 % — AB (ref 39.0–52.0)
Hemoglobin: 10.9 g/dL — ABNORMAL LOW (ref 13.0–17.0)
LYMPHS ABS: 0.6 10*3/uL — AB (ref 0.7–4.0)
LYMPHS PCT: 5 % — AB (ref 12–46)
MCH: 31.1 pg (ref 26.0–34.0)
MCHC: 34.7 g/dL (ref 30.0–36.0)
MCV: 89.5 fL (ref 78.0–100.0)
MONO ABS: 0.8 10*3/uL (ref 0.1–1.0)
MONOS PCT: 7 % (ref 3–12)
NEUTROS ABS: 9.4 10*3/uL — AB (ref 1.7–7.7)
Neutrophils Relative %: 83 % — ABNORMAL HIGH (ref 43–77)
Platelets: 146 10*3/uL — ABNORMAL LOW (ref 150–400)
RBC: 3.51 MIL/uL — ABNORMAL LOW (ref 4.22–5.81)
RDW: 15.2 % (ref 11.5–15.5)
WBC: 11.5 10*3/uL — ABNORMAL HIGH (ref 4.0–10.5)

## 2015-01-18 LAB — MAGNESIUM: Magnesium: 2.4 mg/dL (ref 1.7–2.4)

## 2015-01-18 MED ORDER — HEPARIN (PORCINE) IN NACL 100-0.45 UNIT/ML-% IJ SOLN
2300.0000 [IU]/h | INTRAMUSCULAR | Status: DC
  Administered 2015-01-18: 2100 [IU]/h via INTRAVENOUS
  Administered 2015-01-19: 2300 [IU]/h via INTRAVENOUS
  Administered 2015-01-20 (×3): 2800 [IU]/h via INTRAVENOUS
  Administered 2015-01-21 – 2015-01-23 (×6): 2600 [IU]/h via INTRAVENOUS
  Administered 2015-01-23: 2400 [IU]/h via INTRAVENOUS
  Administered 2015-01-24 – 2015-01-26 (×4): 2150 [IU]/h via INTRAVENOUS
  Administered 2015-01-26 – 2015-01-27 (×3): 2100 [IU]/h via INTRAVENOUS
  Administered 2015-01-28 (×2): 2300 [IU]/h via INTRAVENOUS
  Filled 2015-01-18 (×26): qty 250

## 2015-01-18 MED ORDER — ONDANSETRON HCL 4 MG/2ML IJ SOLN
4.0000 mg | Freq: Four times a day (QID) | INTRAMUSCULAR | Status: DC | PRN
  Administered 2015-01-18 – 2015-01-24 (×7): 4 mg via INTRAVENOUS
  Filled 2015-01-18 (×7): qty 2

## 2015-01-18 MED ORDER — TRACE MINERALS CR-CU-MN-SE-ZN 10-1000-500-60 MCG/ML IV SOLN
INTRAVENOUS | Status: AC
  Administered 2015-01-18: 18:00:00 via INTRAVENOUS
  Filled 2015-01-18: qty 2400

## 2015-01-18 MED ORDER — FUROSEMIDE 10 MG/ML IJ SOLN
80.0000 mg | Freq: Two times a day (BID) | INTRAMUSCULAR | Status: DC
  Administered 2015-01-18 – 2015-01-23 (×11): 80 mg via INTRAVENOUS
  Filled 2015-01-18 (×12): qty 8

## 2015-01-18 MED ORDER — MIDAZOLAM HCL 2 MG/2ML IJ SOLN
INTRAMUSCULAR | Status: AC
  Filled 2015-01-18: qty 4

## 2015-01-18 NOTE — Progress Notes (Signed)
Spoke with [facility] Dr. Arsenio Loader and per conversation contacted [facility] to update them regarding patient status. Re: Wound previously oozing bright red blood, per day shift nurse.  Dr. Donell Beers requests Heparin remain off for 6 hours, so approximate time to restart is 0000 on 01/19/15.

## 2015-01-18 NOTE — Progress Notes (Signed)
eLink Physician-Brief Progress Note Patient Name: Jared [name] DOB: Apr 22, 1956 MRN: [id number]   Date of Service  01/18/2015  HPI/Events of Note  Oozing from wound. Patient is on a heparin IV infusion. Nurse contacted surgery who request that the heparin IV infusion be held for 6 hours prior to restarting. Follow wound oozing.   eICU Interventions  Plan to hold heparin IV infusion prior to restarting as above.     Intervention Category Minor Interventions: Routine modifications to care plan (e.g. PRN medications for pain, fever)  Vannia Pola Eugene 01/18/2015, 9:17 PM

## 2015-01-18 NOTE — Progress Notes (Signed)
eLink Physician-Brief Progress Note Patient Name: Jared [name] DOB: [birth year] MRN: [id number]   Date of Service  01/18/2015  HPI/Events of Note  Patient restless and uncomfortable.  He was stable from hemodynamic standpoint and on minimal vent settings.  Patient placed on SBT with cpap/ps.  RSBI around 48.  eICU Interventions  Patient extubated     Intervention Category Major Interventions: Respiratory failure - evaluation and management  Henry Russel, P 01/18/2015, 12:33 AM

## 2015-01-18 NOTE — Progress Notes (Signed)
SLP Cancellation Note  Patient Details Name: Jared [name] MRN: [id number] DOB: 1955/07/27   Cancelled treatment:       Reason Eval/Treat Not Completed: Medical issues which prohibited therapy RN reports pt not medically ready. ST to monitor for PO readiness.  Marcene Duos MA, CCC-SLP Acute Care Speech Language Pathologist     Kennieth Rad 01/18/2015, 12:02 PM

## 2015-01-18 NOTE — Progress Notes (Signed)
ANTICOAGULATION CONSULT NOTE - Initial Consult  Pharmacy Consult for Heparin Indication: pulmonary embolus  Allergies  Allergen Reactions  . Morphine And Related Anaphylaxis and Shortness Of Breath  . Montelukast Other (See Comments)    unknown  . Neurontin [Gabapentin] Other (See Comments)    delusional  . Wellbutrin [Bupropion] Palpitations    Insomnia    Patient Measurements: Weight: 284 lb 2.8 oz (128.9 kg) Heparin Dosing Weight: 111.75 kg  Vital Signs: Temp: 98.2 F (36.8 C) (08/28 0821) Temp Source: Oral (08/28 0900) BP: 134/56 mmHg (08/28 0900) Pulse Rate: 82 (08/28 0900)  Labs:  Recent Labs  01/15/15 1821  01/16/15 0340 01/16/15 1240 01/17/15 0400 01/18/15 0445  HGB  --   < > 10.6*  --  11.4* 10.9*  HCT  --   --  30.1*  --  33.0* 31.4*  PLT  --   --  96*  --  122* 146*  HEPARINUNFRC <0.10*  --  <0.10*  --   --   --   CREATININE  --   < > 9.20* 6.65* 8.59* 11.19*  < > = values in this interval not displayed.  Estimated Creatinine Clearance: 10 mL/min (by C-G formula based on Cr of 11.19).   Medical History: Past Medical History  Diagnosis Date  . Chest pain   . HTN (hypertension)   . Hyperlipemia   . DJD (degenerative joint disease)   . BPH (benign prostatic hypertrophy)   . Obesity   . OSA (obstructive sleep apnea)   . Hypercholesteremia   . Chronic back pain   . Anxiety   . Depression     Medications:  Prescriptions prior to admission  Medication Sig Dispense Refill Last Dose  . albuterol (PROVENTIL HFA;VENTOLIN HFA) 108 (90 BASE) MCG/ACT inhaler Inhale 2 puffs into the lungs every 2 (two) hours as needed for wheezing or shortness of breath (cough). 1 Inhaler 2 2 weeks  . ALPRAZolam (XANAX) 1 MG tablet TAKE 1/2-1 TABLET BY MOUTH 3 TIMES DAILY 90 tablet 1 01/07/2015 at Unknown time  . atorvastatin (LIPITOR) 40 MG tablet TAKE 1 TABLET BY MOUTH DAILY OR AS DIRECTED FOR CHOLESTEROL (Patient taking differently: TAKE 1 TABLET BY MOUTH every evening  OR AS DIRECTED FOR CHOLESTEROL) 90 tablet 3 01/07/2015 at Unknown time  . bisoprolol-hydrochlorothiazide (ZIAC) 10-6.25 MG per tablet Take 1 tablet by mouth daily.   01/07/2015 at Unknown time  . Cholecalciferol (VITAMIN D-3) 5000 UNITS TABS Take 2,000 Units by mouth daily.    01/07/2015 at Unknown time  . citalopram (CELEXA) 40 MG tablet TAKE 1 TABLET BY MOUTH EVERY DAY FOR MOOD 90 tablet 0 01/07/2015 at Unknown time  . docusate sodium (COLACE) 100 MG capsule Take 100 mg by mouth daily as needed for mild constipation.    01/05/2015 at Unknown time  . Flaxseed, Linseed, (FLAXSEED OIL PO) Take 1 tablet by mouth daily.     01/07/2015 at Unknown time  . fluticasone (FLONASE) 50 MCG/ACT nasal spray USE 1 TO 2 SPRAYS IN EACHNOSTRIL ONCE DAILY (Patient taking differently: USE 1 TO 2 SPRAYS IN EACHNOSTRIL ONCE DAILY as needed) 16 g 99 Past Month at Unknown time  . losartan-hydrochlorothiazide (HYZAAR) 100-25 MG per tablet TAKE 1 TABLET BY MOUTH EVERY DAY 90 tablet 0 01/07/2015 at Unknown time  . minoxidil (LONITEN) 10 MG tablet Take 1 tablet daily or as directed for BP (Patient taking differently: Take 5 mg by mouth at bedtime. ) 90 tablet 99 01/07/2015 at Unknown time  .  Omega-3 Fatty Acids (FISH OIL BURP-LESS PO) Take 1 tablet by mouth daily.     01/07/2015 at Unknown time  . oxycodone (ROXICODONE) 30 MG immediate release tablet Take 60 mg by mouth every 4 (four) hours as needed for pain.    01/07/2015 at Unknown time  . pregabalin (LYRICA) 75 MG capsule Take 1 to 2 capsules 1 hour before sleep 14 capsule 0 01/07/2015 at Unknown time  . senna (SENOKOT) 8.6 MG TABS tablet Take 4 tablets by mouth at bedtime.   01/07/2015 at Unknown time  . azelastine (ASTELIN) 0.1 % nasal spray 1 to 2 sprays each nostril every 12 hours (Patient taking differently: Place 1-2 sprays into both nostrils 2 (two) times daily as needed for allergies. 1 to 2 sprays each nostril every 12 hours) 30 mL 99 Past Week at Unknown time  . ondansetron  (ZOFRAN) 8 MG tablet 1/2 to 1 tablet up to 3 x daily as needed for nausea. 30 tablet 3 01/03/2015  . testosterone cypionate (DEPOTESTOTERONE CYPIONATE) 200 MG/ML injection Inject 2 cc IM every 2 weeks or as directed by a physician. (Patient taking differently: Inject into the muscle every 28 (twenty-eight) days. Inject 2 cc IM every monthly or as directed by a physician.) 10 mL 2 2 weeks  . [DISCONTINUED] pregabalin (LYRICA) 50 MG capsule Take 1 to 3 capsules 1 hour before sleep (Patient not taking: Reported on 01/08/2015) 21 capsule 0 Not Taking at Unknown time    Assessment: 59 y.o. male who presented with shock d/t abdominal source, now s/p abdominal closure w/ wound VAC. Pt also had elevated cardiac markers and evidence of R heart strain on ECHO, which was concerning for a possible PE. A CT angio has been unable to be performed d/t the pt's decline in renal fxn and inability to get proper access, so heparin was initiated for empiric treatment of a PE.   On 8/23, Heparin was stopped for a CATH procedure and concern of potential HIT d/t a decrease in platelets from 186 to 96 over 5 days. He was then initiated on bivaliruidin. The bivalirudin was stopped on 8/24 at noon d/t bleeding from the pt's mouth. The plan was for the pt to receive a CT angio to determine if he needs [facility] for a potential PE, but proper access was not able to be obtained.   The HIT AB has come back negative (0.212), so the patient was re-initiated on heparin to empirically treat his potential PE. The patient then experienced profuse bleeding from surgical site and IV heparin was stopped. This has now resolved and IV heparin is to be resumed.   H/H 10.9/31.4; Plt up to 146  Goal of Therapy:  Heparin level 0.3-0.7 units/ml Monitor platelets by anticoagulation protocol: Yes   Plan:  -Resume heparin IV at 2100 units/hr (no bolus) two hours after a-line is removed.  -Check 6 hr HL -Daily CBC/HL -Monitor closely for bleeding; pt  had oropharynx bleeding on 8/24 and also has an open abdomen  Vinnie Level, PharmD., BCPS Clinical Pharmacist Pager [phone]

## 2015-01-18 NOTE — Progress Notes (Signed)
Pt extubated with RT, RN, and MD at beside. No complications. Pt soft wrist restraints removed. Pt following commands and answering question approprietly. Vital signs stable. Resp status stable. RN will continue to monitor pt closely.

## 2015-01-18 NOTE — Progress Notes (Signed)
PULMONARY / CRITICAL CARE MEDICINE   Name: Jared [name] MRN: [id number] DOB: 04-Feb-1956    ADMISSION DATE:  01/08/2015 CONSULTATION DATE:  8/18  REFERRING MD :  EDP Dr Criss Alvine  CHIEF COMPLAINT:  Found down  INITIAL PRESENTATION:   59 yo male had severe abdominal pain, and then found unresponsive at home.  In ER he was in shock, cyanotic, with lactic acidosis, and hyperkalemia.  STUDIES:  8/18 Echo >> EF 45 to 50%, mod RV dilation, mod/severe RV dysfx, PAS 47 mmHg 8/18 Renal u/s >> b/l renal echogenicity  SIGNIFICANT EVENTS: 8/18 Admit, CCS, cardiology consulted; brief PEA arrest; laparotomy with placement of wound vac; started heparin gtt for presumed PE 8/19 Renal consulted. Duplex LE negative for DVT 8/20 CRRT  8/21 OR for abd wound closure    8/21 :  To OR this am with abd wound closure , did well . CRRT started yesterday , no change in scr or UOP.  Bradycardia overnight with Amio stopped. HR improved but remains in upper 40s. Remains on pressors with Levo and Vaso , decreased demands    01/12/15; For cath later 01/12/2015 per Dr Jomarie Longs. On pressors. Anuric. On CRRT. Off pressors. Improved Shock Liver. On fent gtt and heparing gtt. 40% fio2   01/13/15: Cath delayed to 01/13/2015. Off pressors. On CRRT. On fent gtt . Off heparin gtt -> due to dropping platelet. HITT panel sent. RN reports significant abd tenderness. CCS reports patient has ileys    01/14/15: CCS reports ongoing ileus but planning TF in 1-2days. Offpressors. Ongong CRRT. Cath relatively clean except occlusion of OM branch - not felt to be primary cause of current illness. This is per renal note. No cath note seen in EMR.  On dilaudid and diprivan gtt for sedation.    Primarhy dx stll unclear and is on empiric Bivalirudin (angiomax) for PE - HITT panel pending. DUplex LE - repeat negative for DVT . RN reports bleeding around oral cavity. Platelets at 86K  01/15/15 - ANgiomax on hold x 24h due to oral bleeding.  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 Platelt around 90K. TNA being initiated 01/15/2015 due to ileus. Anuria with minimal urine otuput. CTA did not happen yesterday due to PIV issue. Today d/w renal - risk of long term anticoagulation for empiric PE better than long term HD. SEdated with diprivan and dilaudid   NOTED: CXR reports he is extubated but there are no bedside issues on vent   01/16/15: Bleeding around abd wound - surface bled and IV heparin held yesterday. Still hgih NG returns and on TNA. On dilaudid and versed gtt. Lot of pain and agitation. Tolerating intemittent HD - 2L volume removal. Making very little urine  01/17/15 - hypertensive - on hydralazine prn. Got HD . Anuric. Did get versed for agitation during dressing change. Being weaned off dilaudid gtt by RN. Doing SBT Following some commands. NG output improved - CCS plan continue TPN. Not on pressors    SUBJECTIVE/OVERNIGHT/INTERVAL HX 01/18/15 -  Bedside round with RN and family:  Off dilaudid gtt. Off precedex gtt. Extubated  overnight by eMD. Colvin Caroli on TPN. CCS encouraging ice-chips. OG came out with ET tube. Still needing dilaidudi prn for chronic back pain (on opioids at home). Not on IV heparin yet - on Sq heparin x 24h. No bleeding anywhere that is obvious. Still with a-line. Wife and son at bedside.  VITAL SIGNS: Temp:  [97.4 F (36.3 C)-98.2 F (36.8 C)] 98.2 F (36.8 C) (08/28  1610) Pulse Rate:  [48-85] 82 (08/28 0900) Resp:  [16-26] 21 (08/28 0900) BP: (109-147)/(46-62) 134/56 mmHg (08/28 0900) SpO2:  [96 %-100 %] 99 % (08/28 0900) Arterial Line BP: (128-181)/(54-83) 144/60 mmHg (08/28 0900) FiO2 (%):  [40 %] 40 % (08/28 0013) Weight:  [128.9 kg (284 lb 2.8 oz)] 128.9 kg (284 lb 2.8 oz) (08/28 0500) HEMODYNAMICS:   VENTILATOR SETTINGS: Vent Mode:  [-] PSV FiO2 (%):  [40 %] 40 % Set Rate:  [25 bmp] 25 bmp Vt Set:  [670 mL] 670 mL PEEP:  [5 cmH20] 5 cmH20 Pressure Support:  [5 cmH20-8 cmH20] 5 cmH20 Plateau  Pressure:  [11 cmH20] 11 cmH20 INTAKE / OUTPUT:  Intake/Output Summary (Last 24 hours) at 01/18/15 0925 Last data filed at 01/18/15 0900  Gross per 24 hour  Intake 3264.69 ml  Output     75 ml  Net 3189.69 ml    Total I/O In: 220 [I.V.:20; TPN:200] Out: -   I/O last 3 completed shifts: In: 5282.1 [I.V.:1698.9; NG/GT:210; IV Piggyback:150] Out: 575 [Urine:125; Emesis/NG output:450]   PHYSICAL EXAMINATION: General: chronic critically ill appearing - looking much better Neuro: RASS 0. CAM-ICU negative for delirium. Slow deconditioned speech. Moves all 4s Psych: thankful,. HEENT: Hoarse voice, Lip blistser + Cardiovascular: SB , no murmur Lungs: faint basilar crackles Abdomen: mild distention, wound vac in place, tenderness diffusely + but improved siginifcantly - as of 01/18/2015 Musculoskeletal: no edema  LABS:  PULMONARY  Recent Labs Lab 01/11/15 1131  PHART 7.439  PCO2ART 30.4*  PO2ART 79.8*  HCO3 20.3  TCO2 21.2  O2SAT 95.1    CBC  Recent Labs Lab 01/16/15 0340 01/17/15 0400 01/18/15 0445  HGB 10.6* 11.4* 10.9*  HCT 30.1* 33.0* 31.4*  WBC 10.2 10.5 11.5*  PLT 96* 122* 146*    COAGULATION  Recent Labs Lab 01/12/15 0801  INR 1.34    CARDIAC    Recent Labs Lab 01/11/15 1215 01/15/15 0442  TROPONINI 4.71* 0.99*   No results for input(s): PROBNP in the last 168 hours.   CHEMISTRY  Recent Labs Lab 01/14/15 0328 01/14/15 1600 01/15/15 0442 01/16/15 0340 01/16/15 1240 01/17/15 0400 01/18/15 0445  NA 134* 134* 136  137 134* 138 137 134*  K 3.9 4.0 3.9  3.9 3.7 4.1 4.1 4.6  CL 101 100* 102  103 101 104 104 103  CO2 23 21* 21*  22 20* 24 22 19*  GLUCOSE 75 88 81  79 109* 125* 128* 124*  BUN 56* 54* 58*  58* 79* 49* 67* 91*  CREATININE 6.56* 6.00* 6.51*  6.50* 9.20* 6.65* 8.59* 11.19*  CALCIUM 7.8* 7.8* 7.9*  8.0* 7.8* 7.8* 7.9* 8.2*  MG 2.6*  --  2.9* 2.9*  --  2.4 2.4  PHOS 4.0 3.8 4.3  4.3 5.4*  --  4.1 6.1*    Estimated Creatinine Clearance: 10 mL/min (by C-G formula based on Cr of 11.19).   LIVER  Recent Labs Lab 01/12/15 0414 01/12/15 0801  01/14/15 1600 01/15/15 0442 01/16/15 0340 01/17/15 0400 01/18/15 0445  AST 686*  --   --   --  93*  --   --   --   ALT 2171*  --   --   --  573*  --   --   --   ALKPHOS 79  --   --   --  65  --   --   --   BILITOT 3.5*  --   --   --  5.3*  --   --   --   PROT 4.3*  --   --   --  4.9*  --   --   --   ALBUMIN 2.0*  --   < > 2.0* 2.0*  1.9* 1.8* 2.0* 1.8*  INR  --  1.34  --   --   --   --   --   --   < > = values in this interval not displayed.   INFECTIOUS  Recent Labs Lab 01/15/15 0145  LATICACIDVEN 0.8     ENDOCRINE CBG (last 3)   Recent Labs  01/18/15 0001 01/18/15 0353 01/18/15 0823  GLUCAP 120* 110* 96         IMAGING x48h  - image(s) personally visualized  -   highlighted in bold Dg Chest Port 1 View  01/17/2015   CLINICAL DATA:  Intubated patient.  EXAM: PORTABLE CHEST - 1 VIEW  COMPARISON:  01/15/2015.  FINDINGS: 0538 hours. Right IJ central venous catheter, left IJ central venous catheter, endotracheal tube and nasogastric tube appear unchanged. The nasogastric tube tip is not visualized. Bibasilar airspace opacities and probable bilateral pleural effusions are unchanged. There is no pneumothorax or definite edema. The heart remains enlarged. Postsurgical changes are noted status post lower cervical fusion.  IMPRESSION: Stable chest with persistent bibasilar airspace opacities and probable pleural effusions.   Electronically Signed   By: Carey Bullocks M.D.   On: 01/17/2015 09:30         ASSESSMENT / PLAN:  PULMONARY ETT 8/18 >>>8/28 A: #hx of OSA  #At admit Acute respiratory failure 2nd to presumed PE, lactic acidosis.   - - HITT Ab negative 01/13/15  Duplex LE negative for DVT 01/09/15 and  01/13/15   -- Local abd wound bleeding 01/15/15 in setting of heparin IV  For presumed PE  Is  on hold. No bleeding  as of 01/18/2015  - s/p extuibation 01/18/2015. On 4L [place]    P:   Restart  IV heparin gtt  Holding off  CTA chest - better risk proile with 1-2 year anticoagulation than higher risk of permanent  HD (wife says HD would be a nightmare for patient pschye) Pulmonary toilet O2 for pulse ox > 88%   CARDIOVASCULAR Rt femoral CVL 8/18 >>8/20 Lt femoral A line 8/18 >> RIJ HD 8/20 >> LIJ 8/20 >>  A: #Baseline -  Hx of HTN, HLD.  #Admit  Shock >> likely RV failure with possible PE.-although venous doppler neg for DVT  X 2   - Cath 8/06/25/14 OM branch occlusion per report and unlikely cause of admission   - resolve and off pressors sine 01/12/15  #Hospital course  -  Complicated by A Fib. Resolved. Off amio since 01/11/15  #Current  -  Hypertensive an needing hydralazine prn    P:  Hold outpt lipitor, bisoprolol, HCTZ, losartan, minoxidil Hydralazine prn  RENAL  #Baseline CKD>> baseline creatinine 1.14 from 12/17/14., BPH   #Admit   - ATN with metab acidosis and hyperkalemia - renal US neg  - CRRT started per renal 8/20  - stopped 01/15/15    - on Int HD since 01/16/15 - s/p 1 Rx only as of 01/18/2015. ; Making very little urine as of 01/18/2015  RF and pan autpimmune negative 01/14/15    P:    per renal     GASTROINTESTINAL A: Acute abdominal pain s/p laparotomy with wound vac 8/18.>wound vac change 8/19 d/t eviceration>to OR  8/21 with wound closure  Shock Liver >LFT tr down   - ABd tenderness + but decreased significantly. OG tube out as of 01/18/2015  -  Oozing from abd dressing resolved after heparin hold 01/15/15  P:  Wil get dysphagi eval 01/18/2015  Post-op care per surgery - encouraging oral sips 01/18/2015  TNA  Since 01/15/2015  Protonix for SUP  Wound VAC with changes tTS   HEMATOLOGIC A: Coagulopathy with elevated INR probably secnodary to shock liver >improving with INR tr down  Thrombocytopenia -  HITT  Negative 01/13/15 s/p on angiomax 01/13/15 -  01/14/15   - bleeding around oral cavity 8./24/16 resolved but ahd abd surface bleed 01/15/15 - also resolved after holding anticoag  P:  Restart   IV HEPARIN  01/18/15 F/u CBC and INR   INFECTIOUS Culture - negative HIV negative 01/14/15 A: Sepsis with presumed abdominal source.>exp lap was neg .     ->Now w/ wound vac will need to cont abx for now . Follow Cx and PCT ,    P:   Day 7 vanc completed 01/14/15 Day 10  Zosynto > stopped 01/17/15  ENDOCRINE A: Hyperglycemia  P:   SSI   NEUROLOGIC A: #[place]  - Hx of chronic back pain, anxiety, depression.  #Admit Acute metabolic encephalopathy >> improved 8/18.>follows commands off sedation   #curerntly   - intact mental status but has chronic back pain    - P:   RASS goal: +1 Do dilaudid prn DC precedex gtt from [facility] Use versed prn Hold outpt xanax, celexa, oxycodone, lyrica   FAMILY Updates - wife updated at bedside 01/12/2015 with presence of RN Darel Hong.No family 01/16/2015 and 01/17/2015 an wife/son again 01/18/2015    IDT meet: due on LOS 7 days which is 01/16/15 - done 01/15/15 with RN Donah Driver at bedside. Full code. TRach./LTAC likely needed. THye are ok wih it. Wife understands that this will be a high mort situation and long drawn out course. Updated again 01/18/2015 - now aim towards PT and moving to SDU. Likely needs long term anticoagulation     Dr. Kalman Shan, M.D., [facility].C.P Pulmonary and Critical Care Medicine Staff Physician [facility] Pager: [phone], If no answer or between  15:00h - 7:00h: call [phone]  01/18/2015 9:25 AM

## 2015-01-18 NOTE — Progress Notes (Signed)
Updated wife that pt has been extubated and requesting family.

## 2015-01-18 NOTE — Progress Notes (Signed)
PARENTERAL NUTRITION CONSULT NOTE - Follow-up  Pharmacy Consult for TPN Indication: Prolonged Ileus  Allergies  Allergen Reactions  . Morphine And Related Anaphylaxis and Shortness Of Breath  . Montelukast Other (See Comments)    unknown  . Neurontin [Gabapentin] Other (See Comments)    delusional  . Wellbutrin [Bupropion] Palpitations    Insomnia    Patient Measurements: Weight: 284 lb 2.8 oz (128.9 kg) Usual Weight: 138 kg on admission (8/20)  Vital Signs: Temp: 98 F (36.7 C) (08/28 0355) Temp Source: Oral (08/28 0355) BP: 141/54 mmHg (08/28 0700) Pulse Rate: 78 (08/28 0730) Intake/Output from previous day: 08/27 0701 - 08/28 0700 In: 3375.2 [I.V.:1045; NG/GT:120; TPN:2210.2] Out: 85 [Urine:85] Intake/Output from this shift:    Labs:  Recent Labs  01/16/15 0340 01/17/15 0400 01/18/15 0445  WBC 10.2 10.5 11.5*  HGB 10.6* 11.4* 10.9*  HCT 30.1* 33.0* 31.4*  PLT 96* 122* 146*     Recent Labs  01/16/15 0340 01/16/15 1240 01/17/15 0400 01/18/15 0445  NA 134* 138 137 134*  K 3.7 4.1 4.1 4.6  CL 101 104 104 103  CO2 20* 24 22 19*  GLUCOSE 109* 125* 128* 124*  BUN 79* 49* 67* 91*  CREATININE 9.20* 6.65* 8.59* 11.19*  CALCIUM 7.8* 7.8* 7.9* 8.2*  MG 2.9*  --  2.4 2.4  PHOS 5.4*  --  4.1 6.1*  ALBUMIN 1.8*  --  2.0* 1.8*  TRIG  --   --  323*  --    Estimated Creatinine Clearance: 10 mL/min (by C-G formula based on Cr of 11.19).    Recent Labs  01/17/15 1948 01/18/15 0001 01/18/15 0353  GLUCAP 139* 120* 110*   Insulin Requirements in the past 24 hours:  1 units SSI since 1800 last night  Current Nutrition:  Clinimix 5/15 at 136ml/hr  Assessment: 59 yo M presents on 8/18 after being found down at home. Was responsive to Narcan and then found to be hypotensive with elevated WBC count in ED. Went to surgery on 8/18 for ex lap but everything looked ok. Being treated for acute resp failure, RV failure with presumed PE,  ATN with metabolic  acidosis.   Surgeries/Procedures: Ex lap on 8/18 where no ischemic bowel or perforation was found. 8/21 went back to OR for closure of abdominal fascia and placement of wound VAC. GI: Prolonged Ileus, continuing TPN until ileus resolves. PPI IV Endo: CBGs well controlled with minimal SSI Lytes: Na 134, K 4.6, Phos elevated 6.1 (likely related to renal dysfunction since pt is not receiving any electrolytes in the TPN), Mg 2.4, CoCa 9.96 (phos x Ca > 55) - (Goal K > 4.0 and Mg > 2.0)  Renal: Was on CRRT but stopped 8/24. Started on iHD 8/26 prn.  UOP < yesterday. Pulm: Intubated 8/18>>8/28 - currently 100% 4L [place] Cards: RV failure with presumed PE. 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 Now stopped due to bleeding. CT on hold due to concerns for renal recovery. HIT panel negative. Hgb 10.9, plts low at 146 but increasing, ASA Hepatobil: LFTs elevated but trending down. Albumin low at 1.8. Prealbumin low at 12.8. Tbili increasing to 5.3 (no jaundice), trigs 323 Neuro: Sedated on dilaudid/precedex gtts  ID: Sepsis with presumed abdmonial source. Afebrile, WBC wnl.  8/18 Blood cx >NEG 8/18 Urine cx >NEG  8/18 Vancomycin >> 8/23 8/18 Zosyn >>8/27  Best Practices: SCDs, heparin SQ TPN Access: 8/20 - CVC triple lumen TPN start date:  8/25 >>  Nutritional Goals: per RD on 8/25 KCal: 7026-3785 Protein: >175 g Fluid: 2 L  Plan:  - Continue clinimix 5/15 (no electrolytes) at 151ml/hr - provides 120gm protein (69% goal) and 1704 kcal (100% goal) - will be unable to meet protein requirements d/t limitations of pre-mixed Clinimix bags - Hold IVFE for first 7 days for ICU patients per ASPEN guidelines (Day #4) - Continue MVI and trace elements in TPN (monitor for jaundice) - Continue sensitive SSI  - F/u AM labs  Ky Rumple, Drake Leach 01/18/2015,7:50 AM

## 2015-01-18 NOTE — Progress Notes (Signed)
eLink Physician-Brief Progress Note Patient Name: DENIEL BLAKEMAN DOB: Sep 16, 1955 MRN: [id number]   Date of Service  01/18/2015  HPI/Events of Note  C/o nausea.  eICU Interventions  Will order zofran prn..     Intervention Category Major Interventions: Other:  Seth Friedlander 01/18/2015, 11:25 PM

## 2015-01-18 NOTE — Progress Notes (Signed)
Patient ID: Jared [name], male   DOB: 1955/10/20, 59 y.o.   MRN: [id number] 5 Days Post-Op  Subjective: Extubated. Breathing comfortably.    Objective: Vital signs in last 24 hours: Temp:  [97.4 F (36.3 C)-98.2 F (36.8 C)] 98.2 F (36.8 C) (08/28 0821) Pulse Rate:  [48-78] 78 (08/28 0730) Resp:  [16-26] 16 (08/28 0730) BP: (100-147)/(46-62) 141/54 mmHg (08/28 0700) SpO2:  [96 %-100 %] 98 % (08/28 0730) Arterial Line BP: (112-181)/(51-83) 153/69 mmHg (08/28 0730) FiO2 (%):  [40 %] 40 % (08/28 0013) Weight:  [128.9 kg (284 lb 2.8 oz)] 128.9 kg (284 lb 2.8 oz) (08/28 0500)    Intake/Output from previous day: 08/27 0701 - 08/28 0700 In: 3375.2 [I.V.:1045; NG/GT:120; TPN:2210.2] Out: 85 [Urine:85] Intake/Output this shift:   Gen:  Looks weak, but well.  Alert and oriented.   Resp: breathing comfortably. Cardio: regular rate and rhythm GI: soft, approp tender.  Non distended.    Lab Results:   Recent Labs  01/17/15 0400 01/18/15 0445  WBC 10.5 11.5*  HGB 11.4* 10.9*  HCT 33.0* 31.4*  PLT 122* 146*   BMET  Recent Labs  01/17/15 0400 01/18/15 0445  NA 137 134*  K 4.1 4.6  CL 104 103  CO2 22 19*  GLUCOSE 128* 124*  BUN 67* 91*  CREATININE 8.59* 11.19*  CALCIUM 7.9* 8.2*   PT/INR No results for input(s): LABPROT, INR in the last 72 hours. ABG No results for input(s): PHART, HCO3 in the last 72 hours.  Invalid input(s): PCO2, PO2  Studies/Results: Dg Chest Port 1 View  01/17/2015   CLINICAL DATA:  Intubated patient.  EXAM: PORTABLE CHEST - 1 VIEW  COMPARISON:  01/15/2015.  FINDINGS: 0538 hours. Right IJ central venous catheter, left IJ central venous catheter, endotracheal tube and nasogastric tube appear unchanged. The nasogastric tube tip is not visualized. Bibasilar airspace opacities and probable bilateral pleural effusions are unchanged. There is no pneumothorax or definite edema. The heart remains enlarged. Postsurgical changes are noted status post lower  cervical fusion.  IMPRESSION: Stable chest with persistent bibasilar airspace opacities and probable pleural effusions.   Electronically Signed   By: Carey Bullocks M.D.   On: 01/17/2015 09:30    Anti-infectives: Anti-infectives    Start     Dose/Rate Route Frequency Ordered Stop   01/15/15 2000  piperacillin-tazobactam (ZOSYN) IVPB 2.25 g  Status:  Discontinued     2.25 g 100 mL/hr over 30 Minutes Intravenous 3 times per day 01/15/15 1511 01/17/15 0958   01/11/15 1630  vancomycin (VANCOCIN) 1,250 mg in sodium chloride 0.9 % 250 mL IVPB  Status:  Discontinued     1,250 mg 166.7 mL/hr over 90 Minutes Intravenous Every 24 hours 01/11/15 1029 01/14/15 1150   01/10/15 1300  vancomycin (VANCOCIN) 1,750 mg in sodium chloride 0.9 % 500 mL IVPB  Status:  Discontinued     1,750 mg 250 mL/hr over 120 Minutes Intravenous Every 48 hours 01/08/15 1327 01/09/15 1537   01/10/15 1300  vancomycin (VANCOCIN) 1,250 mg in sodium chloride 0.9 % 250 mL IVPB  Status:  Discontinued     1,250 mg 166.7 mL/hr over 90 Minutes Intravenous Every 24 hours 01/10/15 0947 01/11/15 1029   01/10/15 1200  piperacillin-tazobactam (ZOSYN) IVPB 2.25 g  Status:  Discontinued     2.25 g 100 mL/hr over 30 Minutes Intravenous 4 times per day 01/10/15 0929 01/15/15 1511   01/09/15 1400  piperacillin-tazobactam (ZOSYN) IVPB 2.25 g  Status:  Discontinued     2.25 g 100 mL/hr over 30 Minutes Intravenous 3 times per day 01/09/15 0908 01/10/15 0929   01/09/15 1100  cefTRIAXone (ROCEPHIN) 1 g in dextrose 5 % 50 mL IVPB  Status:  Discontinued     1 g 100 mL/hr over 30 Minutes Intravenous Every 24 hours 01/08/15 1133 01/08/15 1327   01/09/15 1100  azithromycin (ZITHROMAX) 500 mg in dextrose 5 % 250 mL IVPB  Status:  Discontinued     500 mg 250 mL/hr over 60 Minutes Intravenous Every 24 hours 01/08/15 1133 01/08/15 1327   01/08/15 2200  piperacillin-tazobactam (ZOSYN) IVPB 3.375 g  Status:  Discontinued     3.375 g 12.5 mL/hr over 240  Minutes Intravenous 3 times per day 01/08/15 1327 01/09/15 0908   01/08/15 1400  vancomycin (VANCOCIN) 2,500 mg in sodium chloride 0.9 % 500 mL IVPB     2,500 mg 250 mL/hr over 120 Minutes Intravenous  Once 01/08/15 1327 01/08/15 1747   01/08/15 1330  piperacillin-tazobactam (ZOSYN) IVPB 3.375 g  Status:  Discontinued     3.375 g 100 mL/hr over 30 Minutes Intravenous  Once 01/08/15 1316 01/10/15 0929   01/08/15 1130  cefTRIAXone (ROCEPHIN) 1 g in dextrose 5 % 50 mL IVPB     1 g 100 mL/hr over 30 Minutes Intravenous  Once 01/08/15 1126 01/08/15 1220   01/08/15 1130  azithromycin (ZITHROMAX) 500 mg in dextrose 5 % 250 mL IVPB     500 mg 250 mL/hr over 60 Minutes Intravenous  Once 01/08/15 1126 01/08/15 1322      Assessment/Plan: s/p Procedure(s): Left Heart Cath and Coronary Angiography (N/A) Continue tpn for nutrition support until ileus resolves  Continue dressing changes  OK for ice chips   LOS: 10 days    Skyelynn Rambeau 01/18/2015

## 2015-01-18 NOTE — Progress Notes (Signed)
During 1830 dressing change, copious sanguineous drainage noted from abdomen incision. Heparin gtt placed on hold, elink notified via virtual ICU camera. MD notified, heparin gtt held. Patient in no signs of distress, all VSS. Will continue to monitor patient appropriately and wait for further instructions.   Ree Edman, RN, Scientist, research (physical sciences), Surveyor, minerals.

## 2015-01-18 NOTE — Progress Notes (Signed)
Patient ID: Jared [name], male   DOB: [birth year], 59 y.o.   MRN: [id number] S:extubated yesterday, feels sore but otherwise good O:BP 141/54 mmHg  Pulse 78  Temp(Src) 98.2 F (36.8 C) (Oral)  Resp 16  Wt 128.9 kg (284 lb 2.8 oz)  SpO2 98%  Intake/Output Summary (Last 24 hours) at 01/18/15 0839 Last data filed at 01/18/15 0600  Gross per 24 hour  Intake 3063.16 ml  Output     75 ml  Net 2988.16 ml   Intake/Output: I/O last 3 completed shifts: In: 5166.2 [I.V.:1683; NG/GT:210; IV Piggyback:150] Out: 575 [Urine:125; Emesis/NG output:450]  Intake/Output this shift:    Weight change: 2.4 kg (5 lb 4.7 oz) Gen:WD WM who appears fatigued CVS:no rub Resp:bibasilar crackles Abd:+BS, binder in place, tender to palpation Ext:1+ edema of upper extremities   Recent Labs Lab 01/12/15 0414  01/13/15 1550 01/14/15 0328 01/14/15 1600 01/15/15 0442 01/16/15 0340 01/16/15 1240 01/17/15 0400 01/18/15 0445  NA 137  < > 136 134* 134* 136  137 134* 138 137 134*  K 4.0  < > 4.1 3.9 4.0 3.9  3.9 3.7 4.1 4.1 4.6  CL 103  < > 104 101 100* 102  103 101 104 104 103  CO2 23  < > 22 23 21* 21*  22 20* 24 22 19*  GLUCOSE 87  < > 77 75 88 81  79 109* 125* 128* 124*  BUN 59*  < > 58* 56* 54* 58*  58* 79* 49* 67* 91*  CREATININE 8.03*  < > 6.93* 6.56* 6.00* 6.51*  6.50* 9.20* 6.65* 8.59* 11.19*  ALBUMIN 2.0*  < > 1.9* 2.0* 2.0* 2.0*  1.9* 1.8*  --  2.0* 1.8*  CALCIUM 7.0*  < > 7.5* 7.8* 7.8* 7.9*  8.0* 7.8* 7.8* 7.9* 8.2*  PHOS  --   < > 4.0 4.0 3.8 4.3  4.3 5.4*  --  4.1 6.1*  AST 686*  --   --   --   --  93*  --   --   --   --   ALT 2171*  --   --   --   --  573*  --   --   --   --   < > = values in this interval not displayed. Liver Function Tests:  Recent Labs Lab 01/12/15 0414  01/15/15 0442 01/16/15 0340 01/17/15 0400 01/18/15 0445  AST 686*  --  93*  --   --   --   ALT 2171*  --  573*  --   --   --   ALKPHOS 79  --  65  --   --   --   BILITOT 3.5*  --  5.3*  --   --    --   PROT 4.3*  --  4.9*  --   --   --   ALBUMIN 2.0*  < > 2.0*  1.9* 1.8* 2.0* 1.8*  < > = values in this interval not displayed.  Recent Labs Lab 01/12/15 0414  LIPASE 80*  AMYLASE 218*   No results for input(s): AMMONIA in the last 168 hours. CBC:  Recent Labs Lab 01/14/15 0328  01/15/15 0442 01/16/15 0340 01/17/15 0400 01/18/15 0445  WBC 10.9*  --  10.2 10.2 10.5 11.5*  NEUTROABS  --   < > 8.3* 7.9* 7.8* 9.4*  HGB 12.7*  --  11.7* 10.6* 11.4* 10.9*  HCT 36.1*  --  33.9* 30.1* 33.0* 31.4*  MCV 87.0  --  88.7 86.7 86.8 89.5  PLT 86*  --  90* 96* 122* 146*  < > = values in this interval not displayed. Cardiac Enzymes:  Recent Labs Lab 01/11/15 1215 01/15/15 0442  CKTOTAL  --  717*  CKMB  --  11.9*  TROPONINI 4.71* 0.99*   CBG:  Recent Labs Lab 01/17/15 1221 01/17/15 1615 01/17/15 1948 01/18/15 0001 01/18/15 0353  GLUCAP 121* 120* 139* 120* 110*    Iron Studies: No results for input(s): IRON, TIBC, TRANSFERRIN, FERRITIN in the last 72 hours. Studies/Results: Dg Chest Port 1 View  01/17/2015   CLINICAL DATA:  Intubated patient.  EXAM: PORTABLE CHEST - 1 VIEW  COMPARISON:  01/15/2015.  FINDINGS: 0538 hours. Right IJ central venous catheter, left IJ central venous catheter, endotracheal tube and nasogastric tube appear unchanged. The nasogastric tube tip is not visualized. Bibasilar airspace opacities and probable bilateral pleural effusions are unchanged. There is no pneumothorax or definite edema. The heart remains enlarged. Postsurgical changes are noted status post lower cervical fusion.  IMPRESSION: Stable chest with persistent bibasilar airspace opacities and probable pleural effusions.   Electronically Signed   By: Carey Bullocks M.D.   On: 01/17/2015 09:30   . antiseptic oral rinse  7 mL Mouth Rinse QID  . aspirin  300 mg Rectal Daily  . chlorhexidine gluconate  15 mL Mouth Rinse BID  . heparin subcutaneous  5,000 Units Subcutaneous 3 times per day  .  insulin aspart  0-9 Units Subcutaneous 6 times per day  . pantoprazole (PROTONIX) IV  40 mg Intravenous QHS  . sodium chloride  10 mL Intravenous Q12H    BMET    Component Value Date/Time   NA 134* 01/18/2015 0445   K 4.6 01/18/2015 0445   CL 103 01/18/2015 0445   CO2 19* 01/18/2015 0445   GLUCOSE 124* 01/18/2015 0445   BUN 91* 01/18/2015 0445   CREATININE 11.19* 01/18/2015 0445   CREATININE 1.15 12/17/2014 1707   CALCIUM 8.2* 01/18/2015 0445   GFRNONAA 4* 01/18/2015 0445   GFRNONAA 68 12/17/2014 1707   GFRAA 5* 01/18/2015 0445   GFRAA 79 12/17/2014 1707   CBC    Component Value Date/Time   WBC 11.5* 01/18/2015 0445   RBC 3.51* 01/18/2015 0445   HGB 10.9* 01/18/2015 0445   HCT 31.4* 01/18/2015 0445   PLT 146* 01/18/2015 0445   MCV 89.5 01/18/2015 0445   MCH 31.1 01/18/2015 0445   MCHC 34.7 01/18/2015 0445   RDW 15.2 01/18/2015 0445   LYMPHSABS 0.6* 01/18/2015 0445   MONOABS 0.8 01/18/2015 0445   EOSABS 0.6 01/18/2015 0445   BASOSABS 0.0 01/18/2015 0445     Assessment/Plan:  1. AKI/CKD stage 2, oliguric presumably due to septic shock. CVVHD stopped 01/14/15 and first IHD 01/16/15.  Starting to make some urine on his own so hopefully he will start to show some renal recovery from his ischemic ATN 1. Plan for HD MWF while needed and if he can tolerate from hemodynamic standpoint 2. Will try IV lasix for now to see if he will respond for volume 2. VDRF- per PCCM 3. SIRS- possible PE (RA and RV dilated, RV hypocontractile on ECHO). CT angio on hold for now (concerns for renal recovery as well as IV access). Currently off of pressors. 4. CAD- cath revealed occluded OM, cardiology following 5. Shock liver- starting to improve 6. S/p exploratory lap and fascia closure now s/p wound vac 7. Metabolic acidosis- improved with  CVVHD 8. 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 Improving off heparin 9. Hypoalbuminemia- per primary svc 10. Vascular access-  trialysis catheter RIJ and will need tunneled HD cath +/- AVF/AVG if his renal function does not return  Justinn Welter A

## 2015-01-18 NOTE — Progress Notes (Signed)
RN called into room. Jared [name] trying to get out of bed. Jared [name] trying to hit RN's and NT. Jared [name] pulling at tube. [name] called and MD camera into room. Soft wrist restraints were applied. Dr. Katrinka Blazing requested RT switch vent to PS 5/5 and fellow called to assess Jared [name] ability to be extubated. Rn will monitor Jared [name] closely.

## 2015-01-18 NOTE — Procedures (Signed)
Extubation Procedure Note  Patient Details:   Name: Jared [name] DOB: 12/24/1955 MRN: [id number]   Airway Documentation:  Airway 7 mm (Active)  Secured at (cm) 27 cm 01/17/2015  7:47 PM  Measured From Lips 01/17/2015  7:47 PM  Secured Location Left 01/17/2015  7:47 PM  Secured By [employer] 01/17/2015  7:47 PM  Tube Holder Repositioned Yes 01/17/2015  7:47 PM  Cuff Pressure (cm H2O) 26 cm H2O 01/17/2015  3:58 PM  Site Condition Dry 01/17/2015  8:00 PM    Evaluation  O2 sats: stable throughout Complications: No apparent complications Patient did tolerate procedure well. Bilateral Breath Sounds: Clear, Diminished Suctioning: Oral, Airway Yes   MD at bedside.  Patient placed on 4L nasal cannula with sats 98%.  Patient able to vocalize freely.    Donnelly Angelica 01/18/2015, 12:28 AM

## 2015-01-19 DIAGNOSIS — G9341 Metabolic encephalopathy: Secondary | ICD-10-CM

## 2015-01-19 DIAGNOSIS — R5381 Other malaise: Secondary | ICD-10-CM

## 2015-01-19 LAB — BLOOD GAS, ARTERIAL
ACID-BASE DEFICIT: 8.8 mmol/L — AB (ref 0.0–2.0)
BICARBONATE: 16.4 meq/L — AB (ref 20.0–24.0)
Drawn by: 405301
FIO2: 0.21
O2 SAT: 95.9 %
PCO2 ART: 36.8 mmHg (ref 35.0–45.0)
PO2 ART: 96.2 mmHg (ref 80.0–100.0)
Patient temperature: 100.6
TCO2: 17.5 mmol/L (ref 0–100)
pH, Arterial: 7.279 — ABNORMAL LOW (ref 7.350–7.450)

## 2015-01-19 LAB — CBC
HCT: 30.6 % — ABNORMAL LOW (ref 39.0–52.0)
Hemoglobin: 10.6 g/dL — ABNORMAL LOW (ref 13.0–17.0)
MCH: 30.4 pg (ref 26.0–34.0)
MCHC: 34.6 g/dL (ref 30.0–36.0)
MCV: 87.7 fL (ref 78.0–100.0)
PLATELETS: 208 10*3/uL (ref 150–400)
RBC: 3.49 MIL/uL — ABNORMAL LOW (ref 4.22–5.81)
RDW: 15.6 % — AB (ref 11.5–15.5)
WBC: 12.3 10*3/uL — ABNORMAL HIGH (ref 4.0–10.5)

## 2015-01-19 LAB — PREALBUMIN: PREALBUMIN: 24.5 mg/dL (ref 18–38)

## 2015-01-19 LAB — COMPREHENSIVE METABOLIC PANEL
ALBUMIN: 1.9 g/dL — AB (ref 3.5–5.0)
ALK PHOS: 80 U/L (ref 38–126)
ALT: 183 U/L — AB (ref 17–63)
AST: 44 U/L — ABNORMAL HIGH (ref 15–41)
Anion gap: 16 — ABNORMAL HIGH (ref 5–15)
BILIRUBIN TOTAL: 5.2 mg/dL — AB (ref 0.3–1.2)
BUN: 110 mg/dL — AB (ref 6–20)
CALCIUM: 8.4 mg/dL — AB (ref 8.9–10.3)
CO2: 17 mmol/L — AB (ref 22–32)
CREATININE: 13.12 mg/dL — AB (ref 0.61–1.24)
Chloride: 99 mmol/L — ABNORMAL LOW (ref 101–111)
GFR calc Af Amer: 4 mL/min — ABNORMAL LOW (ref >60)
GFR calc non Af Amer: 4 mL/min — ABNORMAL LOW (ref >60)
GLUCOSE: 97 mg/dL (ref 65–99)
Potassium: 4.4 mmol/L (ref 3.5–5.1)
SODIUM: 132 mmol/L — AB (ref 135–145)
TOTAL PROTEIN: 5 g/dL — AB (ref 6.5–8.1)

## 2015-01-19 LAB — DIFFERENTIAL
BASOS PCT: 0 % (ref 0–1)
Basophils Absolute: 0 10*3/uL (ref 0.0–0.1)
EOS ABS: 0.2 10*3/uL (ref 0.0–0.7)
EOS PCT: 2 % (ref 0–5)
LYMPHS PCT: 5 % — AB (ref 12–46)
Lymphs Abs: 0.6 10*3/uL — ABNORMAL LOW (ref 0.7–4.0)
MONO ABS: 1 10*3/uL (ref 0.1–1.0)
Monocytes Relative: 8 % (ref 3–12)
Neutro Abs: 10.5 10*3/uL — ABNORMAL HIGH (ref 1.7–7.7)
Neutrophils Relative %: 85 % — ABNORMAL HIGH (ref 43–77)

## 2015-01-19 LAB — HEPARIN LEVEL (UNFRACTIONATED)
HEPARIN UNFRACTIONATED: 0.25 [IU]/mL — AB (ref 0.30–0.70)
Heparin Unfractionated: 0.21 IU/mL — ABNORMAL LOW (ref 0.30–0.70)

## 2015-01-19 LAB — PHOSPHORUS: Phosphorus: 7 mg/dL — ABNORMAL HIGH (ref 2.5–4.6)

## 2015-01-19 LAB — GLUCOSE, CAPILLARY
GLUCOSE-CAPILLARY: 120 mg/dL — AB (ref 65–99)
Glucose-Capillary: 108 mg/dL — ABNORMAL HIGH (ref 65–99)
Glucose-Capillary: 92 mg/dL (ref 65–99)

## 2015-01-19 LAB — MAGNESIUM: Magnesium: 2.3 mg/dL (ref 1.7–2.4)

## 2015-01-19 MED ORDER — ASPIRIN 81 MG PO CHEW
81.0000 mg | CHEWABLE_TABLET | Freq: Every day | ORAL | Status: DC
  Administered 2015-01-19 – 2015-02-02 (×15): 81 mg via ORAL
  Filled 2015-01-19 (×16): qty 1

## 2015-01-19 MED ORDER — TRACE MINERALS CR-CU-MN-SE-ZN 10-1000-500-60 MCG/ML IV SOLN
INTRAVENOUS | Status: AC
  Administered 2015-01-19: 19:00:00 via INTRAVENOUS
  Filled 2015-01-19: qty 2400

## 2015-01-19 NOTE — Progress Notes (Signed)
Rehab Admissions Coordinator Note:  Patient was screened by Trish Mage for appropriateness for an Inpatient Acute Rehab Consult.  At this time, an inpatient rehab consult has been ordered and is pending.  An admissions coordinator will follow up after consult is completed.  Trish Mage 01/19/2015, 2:43 PM  I can be reached at [phone].

## 2015-01-19 NOTE — Progress Notes (Signed)
Nutrition Follow-up  DOCUMENTATION CODES:   Obesity unspecified  INTERVENTION:    Nectar thick clear liquids, diet advancement per surgery team.   Continue TPN dosing per Pharmacy, now that patient has been extubated and starting on a clear liquid diet, can increase calorie provision. No longer need to hold IV lipids if needed to meet calorie needs. See re-estimated needs below.  NUTRITION DIAGNOSIS:   Inadequate oral intake related to altered GI function as evidenced by diet just advanced to clear liquids.  Ongoing  GOAL:   Patient will meet greater than or equal to 90% of their needs  Unmet, progressing  MONITOR:   Diet advancement, PO intake, Supplement acceptance, Skin, Weight trends, Labs  REASON FOR ASSESSMENT:   Consult New TPN/TNA  ASSESSMENT:   59 yo male had severe abdominal pain, and then found unresponsive at home. In ER he was in shock, cyanotic, with lactic acidosis, and hyperkalemia. S/P ex lap with placement of wound VAC to abd wound.  Patient was extubated on 8/28. No longer has VAC to abdominal wound or NGT.   S/P BSE with SLP this AM, recommends nectar-thick liquids. Diet advancing to clear liquids today per surgery team. Diet clarified by RD to nectar thick clear liquids. Discussed with CCM. TPN continues.  Patient is receiving TPN with Clinimix 5/15 @ 100 ml/hr providing 2400 ml, 1704 kcal, and 120 grams protein per day. Meets 71% minimum re-estimated energy needs and 80% minimum re-estimated protein needs.   Diet Order:  TPN (CLINIMIX) Adult without lytes TPN (CLINIMIX) Adult without lytes Diet clear liquid Room service appropriate?: Yes; Fluid consistency:: Honey Thick  Skin:  Wound (see comment) (closed abd incision)  Last BM:  PTA  Height:   Ht Readings from Last 1 Encounters:  01/05/15 6' 2.5" (1.892 m)    Weight:   Wt Readings from Last 1 Encounters:  01/19/15 292 lb 8.8 oz (132.7 kg)  01/15/15 275 lb 12.7 oz (125.1  kg) 01/10/15 305 lb 5.4 oz (138.5 kg)  Ideal Body Weight:  87.7 kg  BMI:  Body mass index is 37.07 kg/(m^2).  Estimated Nutritional Needs:   Kcal:  2400-2600  Protein:  150-175 gm  Fluid:  2.4-2.6 L  EDUCATION NEEDS:   No education needs identified at this time   Joaquin Courts, RD, LDN, CNSC Pager [phone] After Hours Pager [phone]

## 2015-01-19 NOTE — Progress Notes (Signed)
6 Days Post-Op  Subjective: Awake and alert Denies nausea Passing flatus  Objective: Vital signs in last 24 hours: Temp:  [98 F (36.7 C)-100.2 F (37.9 C)] 98 F (36.7 C) (08/29 0804) Pulse Rate:  [82-111] 101 (08/29 0800) Resp:  [17-29] 23 (08/29 0800) BP: (110-163)/(52-82) 161/77 mmHg (08/29 0800) SpO2:  [94 %-99 %] 97 % (08/29 0800) Arterial Line BP: (144-161)/(60-69) 149/63 mmHg (08/28 1100) Weight:  [132.7 kg (292 lb 8.8 oz)] 132.7 kg (292 lb 8.8 oz) (08/29 0500)    Intake/Output from previous day: 08/28 0701 - 08/29 0700 In: 2873.8 [I.V.:473.8; TPN:2400] Out: 340 [Urine:340] Intake/Output this shift:    Abdomen soft with mild fullness, minimally tender Wound clean  Lab Results:   Recent Labs  01/18/15 0445 01/19/15 0436  WBC 11.5* 12.3*  HGB 10.9* 10.6*  HCT 31.4* 30.6*  PLT 146* 208   BMET  Recent Labs  01/18/15 0445 01/19/15 0436  NA 134* 132*  K 4.6 4.4  CL 103 99*  CO2 19* 17*  GLUCOSE 124* 97  BUN 91* 110*  CREATININE 11.19* 13.12*  CALCIUM 8.2* 8.4*   PT/INR No results for input(s): LABPROT, INR in the last 72 hours. ABG No results for input(s): PHART, HCO3 in the last 72 hours.  Invalid input(s): PCO2, PO2  Studies/Results: Dg Chest Port 1 View  01/18/2015   CLINICAL DATA:  Endotracheal tube.  Shortness of breath.  EXAM: PORTABLE CHEST - 1 VIEW  COMPARISON:  1 day prior  FINDINGS: Right internal jugular Cordis sheath unchanged. Lower cervical spine fixation. Left internal jugular line tip at high SVC. Endotracheal and nasogastric tubes have been removed. Cardiomegaly accentuated by AP portable technique. Improved to resolved pleural effusions. No pneumothorax. No congestive failure. Improved bibasilar airspace disease.  IMPRESSION: Improved aeration with decreased to resolved pleural effusions and decreased bibasilar atelectasis.  Cardiomegaly without congestive failure.  Extubation and removal of nasogastric tube.   Electronically Signed    By: Jeronimo Greaves M.D.   On: 01/18/2015 09:35    Anti-infectives: Anti-infectives    Start     Dose/Rate Route Frequency Ordered Stop   01/15/15 2000  piperacillin-tazobactam (ZOSYN) IVPB 2.25 g  Status:  Discontinued     2.25 g 100 mL/hr over 30 Minutes Intravenous 3 times per day 01/15/15 1511 01/17/15 0958   01/11/15 1630  vancomycin (VANCOCIN) 1,250 mg in sodium chloride 0.9 % 250 mL IVPB  Status:  Discontinued     1,250 mg 166.7 mL/hr over 90 Minutes Intravenous Every 24 hours 01/11/15 1029 01/14/15 1150   01/10/15 1300  vancomycin (VANCOCIN) 1,750 mg in sodium chloride 0.9 % 500 mL IVPB  Status:  Discontinued     1,750 mg 250 mL/hr over 120 Minutes Intravenous Every 48 hours 01/08/15 1327 01/09/15 1537   01/10/15 1300  vancomycin (VANCOCIN) 1,250 mg in sodium chloride 0.9 % 250 mL IVPB  Status:  Discontinued     1,250 mg 166.7 mL/hr over 90 Minutes Intravenous Every 24 hours 01/10/15 0947 01/11/15 1029   01/10/15 1200  piperacillin-tazobactam (ZOSYN) IVPB 2.25 g  Status:  Discontinued     2.25 g 100 mL/hr over 30 Minutes Intravenous 4 times per day 01/10/15 0929 01/15/15 1511   01/09/15 1400  piperacillin-tazobactam (ZOSYN) IVPB 2.25 g  Status:  Discontinued     2.25 g 100 mL/hr over 30 Minutes Intravenous 3 times per day 01/09/15 0908 01/10/15 0929   01/09/15 1100  cefTRIAXone (ROCEPHIN) 1 g in dextrose 5 % 50  mL IVPB  Status:  Discontinued     1 g 100 mL/hr over 30 Minutes Intravenous Every 24 hours 01/08/15 1133 01/08/15 1327   01/09/15 1100  azithromycin (ZITHROMAX) 500 mg in dextrose 5 % 250 mL IVPB  Status:  Discontinued     500 mg 250 mL/hr over 60 Minutes Intravenous Every 24 hours 01/08/15 1133 01/08/15 1327   01/08/15 2200  piperacillin-tazobactam (ZOSYN) IVPB 3.375 g  Status:  Discontinued     3.375 g 12.5 mL/hr over 240 Minutes Intravenous 3 times per day 01/08/15 1327 01/09/15 0908   01/08/15 1400  vancomycin (VANCOCIN) 2,500 mg in sodium chloride 0.9 % 500 mL  IVPB     2,500 mg 250 mL/hr over 120 Minutes Intravenous  Once 01/08/15 1327 01/08/15 1747   01/08/15 1330  piperacillin-tazobactam (ZOSYN) IVPB 3.375 g  Status:  Discontinued     3.375 g 100 mL/hr over 30 Minutes Intravenous  Once 01/08/15 1316 01/10/15 0929   01/08/15 1130  cefTRIAXone (ROCEPHIN) 1 g in dextrose 5 % 50 mL IVPB     1 g 100 mL/hr over 30 Minutes Intravenous  Once 01/08/15 1126 01/08/15 1220   01/08/15 1130  azithromycin (ZITHROMAX) 500 mg in dextrose 5 % 250 mL IVPB     500 mg 250 mL/hr over 60 Minutes Intravenous  Once 01/08/15 1126 01/08/15 1322      Assessment/Plan: s/p Procedure(s): Left Heart Cath and Coronary Angiography (N/A) S/p exp lap  Continue wound care. Ok from surgery standpoint to try clear liquids Continuing TNA  LOS: 11 days    Daviana Haymaker A 01/19/2015

## 2015-01-19 NOTE — Progress Notes (Signed)
eLink Physician-Brief Progress Note Patient Name: Jared [name] DOB: 1955/10/28 MRN: [id number]   Date of Service  01/19/2015  HPI/Events of Note  Patient with decreased LOC and guppy type breathing.   Is HD stable with sats of 96% and RR of 18.   GCS earlier was documented at 15 and 14  eICU Interventions  For now check ABG Continue to monitor patient via [facility]     Intervention Category Intermediate Interventions: Respiratory distress - evaluation and management;Other:  Sherah Lund 01/19/2015, 11:10 PM

## 2015-01-19 NOTE — Progress Notes (Signed)
ANTICOAGULATION CONSULT NOTE - Follow-up Consult  Pharmacy Consult for Heparin Indication: pulmonary embolus  Allergies  Allergen Reactions  . Morphine And Related Anaphylaxis and Shortness Of Breath  . Montelukast Other (See Comments)    unknown  . Neurontin [Gabapentin] Other (See Comments)    delusional  . Wellbutrin [Bupropion] Palpitations    Insomnia    Patient Measurements: Weight: 285 lb 0.9 oz (129.3 kg) Heparin Dosing Weight: 111.75 kg  Vital Signs: Temp: 98.4 F (36.9 C) (08/29 1710) Temp Source: Oral (08/29 1710) BP: 117/63 mmHg (08/29 1710) Pulse Rate: 101 (08/29 1710)  Labs:  Recent Labs  01/17/15 0400 01/18/15 0445 01/19/15 0436 01/19/15 0634 01/19/15 1500  HGB 11.4* 10.9* 10.6*  --   --   HCT 33.0* 31.4* 30.6*  --   --   PLT 122* 146* 208  --   --   HEPARINUNFRC  --   --   --  0.21* 0.25*  CREATININE 8.59* 11.19* 13.12*  --   --     Estimated Creatinine Clearance: 8.5 mL/min (by C-G formula based on Cr of 13.12).   Assessment: 59 y.o. male on heparin for potential PE. Heparin to be discontinued if V/Q scan is negative. Heparin level subtherapeutic (0.25) on 2300 units/hr. No reported bleeding recently (of note, patient had oozing blood from wound so heparin was off for about 6 hours on 01/18/15)..   Goal of Therapy:  Heparin level 0.3-0.7 units/ml Monitor platelets by anticoagulation protocol: Yes   Plan:  -Increase heparin to 2500 units/hr -Check 8 hr HL -Monitor closely for further bleeding  Juanita Craver, PharmD, BCPS Clinical Pharmacist [phone]

## 2015-01-19 NOTE — Progress Notes (Signed)
Patient ID: Jared [name], male   DOB: 04/12/1956, 59 y.o.   MRN: [id number] S: awake, able to answer some questions, denies any complaint.  O:BP 155/75 mmHg  Pulse 98  Temp(Src) 98 F (36.7 C) (Oral)  Resp 23  Wt 292 lb 8.8 oz (132.7 kg)  SpO2 97%  Intake/Output Summary (Last 24 hours) at 01/19/15 0743 Last data filed at 01/19/15 0700  Gross per 24 hour  Intake 2873.8 ml  Output    340 ml  Net 2533.8 ml   Intake/Output: I/O last 3 completed shifts: In: 4555.2 [I.V.:925.2; NG/GT:30] Out: 415 [Urine:415]  Intake/Output this shift:    Weight change: 8 lb 6 oz (3.8 kg) Gen:WD WM who appears fatigued Heart: RRR, no m/r/g Resp:bibasilar crackles and wheezes.  Abd:+BS, binder in place, tender to palpation Ext:1+ edema of upper extremities   Recent Labs Lab 01/14/15 0328 01/14/15 1600 01/15/15 0442 01/16/15 0340 01/16/15 1240 01/17/15 0400 01/18/15 0445 01/19/15 0436  NA 134* 134* 136  137 134* 138 137 134* 132*  K 3.9 4.0 3.9  3.9 3.7 4.1 4.1 4.6 4.4  CL 101 100* 102  103 101 104 104 103 99*  CO2 23 21* 21*  22 20* 24 22 19* 17*  GLUCOSE 75 88 81  79 109* 125* 128* 124* 97  BUN 56* 54* 58*  58* 79* 49* 67* 91* 110*  CREATININE 6.56* 6.00* 6.51*  6.50* 9.20* 6.65* 8.59* 11.19* 13.12*  ALBUMIN 2.0* 2.0* 2.0*  1.9* 1.8*  --  2.0* 1.8* 1.9*  CALCIUM 7.8* 7.8* 7.9*  8.0* 7.8* 7.8* 7.9* 8.2* 8.4*  PHOS 4.0 3.8 4.3  4.3 5.4*  --  4.1 6.1* 7.0*  AST  --   --  93*  --   --   --   --  44*  ALT  --   --  573*  --   --   --   --  183*   Liver Function Tests:  Recent Labs Lab 01/15/15 0442  01/17/15 0400 01/18/15 0445 01/19/15 0436  AST 93*  --   --   --  44*  ALT 573*  --   --   --  183*  ALKPHOS 65  --   --   --  80  BILITOT 5.3*  --   --   --  5.2*  PROT 4.9*  --   --   --  5.0*  ALBUMIN 2.0*  1.9*  < > 2.0* 1.8* 1.9*  < > = values in this interval not displayed. No results for input(s): LIPASE, AMYLASE in the last 168 hours. No results for input(s):  AMMONIA in the last 168 hours. CBC:  Recent Labs Lab 01/15/15 0442 01/16/15 0340 01/17/15 0400 01/18/15 0445 01/19/15 0436  WBC 10.2 10.2 10.5 11.5* 12.3*  NEUTROABS 8.3* 7.9* 7.8* 9.4* 10.5*  HGB 11.7* 10.6* 11.4* 10.9* 10.6*  HCT 33.9* 30.1* 33.0* 31.4* 30.6*  MCV 88.7 86.7 86.8 89.5 87.7  PLT 90* 96* 122* 146* 208   Cardiac Enzymes:  Recent Labs Lab 01/15/15 0442  CKTOTAL 717*  CKMB 11.9*  TROPONINI 0.99*   CBG:  Recent Labs Lab 01/18/15 0823 01/18/15 1221 01/18/15 1629 01/18/15 1948 01/18/15 2350  GLUCAP 96 125* 117* 108* 92    Iron Studies: No results for input(s): IRON, TIBC, TRANSFERRIN, FERRITIN in the last 72 hours. Studies/Results: Dg Chest Port 1 View  01/18/2015   CLINICAL DATA:  Endotracheal tube.  Shortness of breath.  EXAM: PORTABLE  CHEST - 1 VIEW  COMPARISON:  1 day prior  FINDINGS: Right internal jugular Cordis sheath unchanged. Lower cervical spine fixation. Left internal jugular line tip at high SVC. Endotracheal and nasogastric tubes have been removed. Cardiomegaly accentuated by AP portable technique. Improved to resolved pleural effusions. No pneumothorax. No congestive failure. Improved bibasilar airspace disease.  IMPRESSION: Improved aeration with decreased to resolved pleural effusions and decreased bibasilar atelectasis.  Cardiomegaly without congestive failure.  Extubation and removal of nasogastric tube.   Electronically Signed   By: Jeronimo Greaves M.D.   On: 01/18/2015 09:35   . antiseptic oral rinse  7 mL Mouth Rinse QID  . aspirin  300 mg Rectal Daily  . chlorhexidine gluconate  15 mL Mouth Rinse BID  . furosemide  80 mg Intravenous BID  . insulin aspart  0-9 Units Subcutaneous 6 times per day  . pantoprazole (PROTONIX) IV  40 mg Intravenous QHS  . sodium chloride  10 mL Intravenous Q12H    BMET    Component Value Date/Time   NA 132* 01/19/2015 0436   K 4.4 01/19/2015 0436   CL 99* 01/19/2015 0436   CO2 17* 01/19/2015 0436    GLUCOSE 97 01/19/2015 0436   BUN 110* 01/19/2015 0436   CREATININE 13.12* 01/19/2015 0436   CREATININE 1.15 12/17/2014 1707   CALCIUM 8.4* 01/19/2015 0436   GFRNONAA 4* 01/19/2015 0436   GFRNONAA 68 12/17/2014 1707   GFRAA 4* 01/19/2015 0436   GFRAA 79 12/17/2014 1707   CBC    Component Value Date/Time   WBC 12.3* 01/19/2015 0436   RBC 3.49* 01/19/2015 0436   HGB 10.6* 01/19/2015 0436   HCT 30.6* 01/19/2015 0436   PLT 208 01/19/2015 0436   MCV 87.7 01/19/2015 0436   MCH 30.4 01/19/2015 0436   MCHC 34.6 01/19/2015 0436   RDW 15.6* 01/19/2015 0436   LYMPHSABS 0.6* 01/19/2015 0436   MONOABS 1.0 01/19/2015 0436   EOSABS 0.2 01/19/2015 0436   BASOSABS 0.0 01/19/2015 0436     Assessment/Plan:  1. AKI/CKD stage 2, oliguric presumably due to septic shock. CVVHD stopped 01/14/15 and first IHD 01/16/15.  Crt continues to rise. Starting to make some urine on his own but not much, hopefully will start to show some renal recovery from his ischemic ATN. 1. Will dialyze him today to help with uremia, acid status and volume. Plan for HD MWF while needed and if he can tolerate from hemodynamic standpoint 2. Cont IV lasix for now to help with volume. CXR continues to show improvement.   2. SIRS- possible PE (RA and RV dilated, RV hypocontractile on ECHO). CT angio on hold for now (concerns for renal recovery as well as IV access).plan is long term anticoagulation without CT angio Currently off of pressors and excubated. 3. CAD- cath revealed occluded OM, cardiology following 4. Shocked liver- improved.  5. S/p exploratory lap and fascia closure now s/p wound vac - with some bleeding, improved after holding anticoag, now back on anticoag.  6. Metabolic acidosis- improved with CVVHD, now worsening again as had no HD since 8/26. HD as above.  7. Thrombocytopenia- hit panel neg. Likely 2/2 to shocked liver. Now improved. Back on heparin - no bleeding currently.  8. Hypoalbuminemia- per primary  svc 9. Vascular access- trialysis catheter RIJ and will need tunneled HD cath +/- AVF/AVG if his renal function does not return 10. Nutrition: has TPN, surgery is ok with restarting liquid diet today.    Plan: - HD  todayTasia [name], Jared [name]

## 2015-01-19 NOTE — Evaluation (Signed)
Physical Therapy Evaluation Patient Details Name: Jared [name] MRN: [id number] DOB: [birth year] Today's Date: 01/19/2015   History of Present Illness  Pt is a 59 y/o male with a PMH of chronic back pain, chest pain, HTN, hyerlipidemia, obesity, sleep apnea, depression. Pt was found down at home by wife on 01/08/15. He was intubated due to acute respiratory failure. Pt had exploratory laparotomy, cardiac catheterization, and received dialysis treatment. Pt's primary issues are pulmonary embolism and difficulty with his abdominal wound. Pt's active problems are spetic shock, acute respiratory failure with hypoxia, acute respiratory failure with hypoxemia, cardigenic shock, acute kidney injury, and renal failure. Pt was extubated on 01/18/15 after a 10 day intubation period.  Clinical Impression  Pt admitted with above diagnosis. Pt currently with functional limitations due to the deficits listed below (see PT Problem List). At the time of PT eval pt was able to perform transfers with +2 to +3 assist for balance, safety, and support. Pt with decreased verbal responses but followed commands fairly well with tactile cueing in combination with verbal cues. Pt will benefit from skilled PT to increase their independence and safety with mobility to allow discharge to the venue listed below.       Follow Up Recommendations CIR;Supervision/Assistance - 24 hour    Equipment Recommendations  Rolling walker with 5" wheels;3in1 (PT)    Recommendations for Other Services       Precautions / Restrictions Precautions Precautions: Fall Restrictions Weight Bearing Restrictions: No      Mobility  Bed Mobility Overal bed mobility: Needs Assistance;+2 for physical assistance;+ 2 for safety/equipment Bed Mobility: Supine to Sit     Supine to sit: Total assist;+2 for physical assistance;HOB elevated     General bed mobility comments: Pt required total assist for all aspects of bed mobility. Bed pad used to  assist with achieving EOB. Hand-over-hand assist required for reaching and hand placement on seated surface for balance.   Transfers Overall transfer level: Needs assistance Equipment used: Rolling walker (2 wheeled) Transfers: Sit to/from [employer] Sit to Stand: Max assist;+2 physical assistance;From elevated surface Stand pivot transfers: Max assist;+2 physical assistance;+2 safety/equipment       General transfer comment: Pt required +2 assist to power-up to full standing position. Increased time for pt to improve posture and be able to grab the walker for support. Specific and direct verbal cues required for pt to follow commands. Pt with 2 episodes of knees buckling, however was able to take small pivotal steps around to the recliner. Pt had difficulty turning around fully and chair was brought up the rest of the way for pt to sit safely.  Ambulation/Gait             General Gait Details: Unable at this time.   Stairs            Wheelchair Mobility    Modified Rankin (Stroke Patients Only)       Balance Overall balance assessment: Needs assistance Sitting-balance support: Feet supported;Bilateral upper extremity supported Sitting balance-Leahy Scale: Poor Sitting balance - Comments: Several instances of trunk "falling" forward at EOB in which assist was required to return to upright position.    Standing balance support: Bilateral upper extremity supported;During functional activity Standing balance-Leahy Scale: Poor Standing balance comment: +2 assist required                             Pertinent Vitals/Pain Pain Assessment: Faces  Faces Pain Scale: Hurts whole lot Pain Location: Abdomen during mobility Pain Descriptors / Indicators: Grimacing;Operative site guarding Pain Intervention(s): Limited activity within patient's tolerance;Monitored during session;Repositioned    Home Living Family/patient expects to be discharged  to:: Inpatient rehab                 Additional Comments: Pt was unable to provide details of home environment. Only able to report that he lives with his wife.    Prior Function           Comments: Pt unable to report PLOF however it is likely he was independent.      Hand Dominance        Extremity/Trunk Assessment   Upper Extremity Assessment: Generalized weakness (BUE edema. Appeared painful with mobility. )           Lower Extremity Assessment: Generalized weakness      Cervical / Trunk Assessment: Normal  Communication   Communication: Expressive difficulties  Cognition Arousal/Alertness: Lethargic Behavior During Therapy: Flat affect Overall Cognitive Status: Impaired/Different from baseline Area of Impairment: Orientation;Attention;Following commands;Safety/judgement;Awareness;Problem solving   Current Attention Level: Focused Memory: Decreased short-term memory Following Commands: Follows one step commands with increased time;Follows one step commands inconsistently Safety/Judgement: Decreased awareness of safety;Decreased awareness of deficits Awareness: Intellectual Problem Solving: Slow processing;Decreased initiation;Difficulty sequencing;Requires verbal cues;Requires tactile cues General Comments: Pt answered questions inconsistently. When asked orientation questions, pt appeared to be thinking hard about what he was going to say, but then never actually answered and fell back asleep.    General Comments      Exercises        Assessment/Plan    PT Assessment Patient needs continued PT services  PT Diagnosis Difficulty walking;Generalized weakness   PT Problem List Decreased strength;Decreased range of motion;Decreased activity tolerance;Decreased balance;Decreased mobility;Decreased knowledge of use of DME;Decreased safety awareness;Decreased knowledge of precautions;Pain  PT Treatment Interventions DME instruction;Gait  training;Functional mobility training;Stair training;Therapeutic activities;Therapeutic exercise;Neuromuscular re-education;Patient/family education   PT Goals (Current goals can be found in the Care Plan section) Acute Rehab PT Goals PT Goal Formulation: Patient unable to participate in goal setting Time For Goal Achievement: 02/02/15 Potential to Achieve Goals: Good    Frequency Min 3X/week   Barriers to discharge        Co-evaluation               End of Session Equipment Utilized During Treatment: Gait belt;Oxygen Activity Tolerance: Patient limited by lethargy;Patient limited by fatigue Patient left: in chair;with call bell/phone within reach Nurse Communication: Mobility status         Time: 1610-9604 PT Time Calculation (min) (ACUTE ONLY): 32 min   Charges:   PT Evaluation $Initial PT Evaluation Tier I: 1 Procedure PT Treatments $Therapeutic Activity: 8-22 mins   PT G Codes:        Conni Slipper 2015-02-17, 12:05 PM   Conni Slipper, PT, DPT Acute Rehabilitation Services Pager: [phone]

## 2015-01-19 NOTE — Consult Note (Signed)
Physical Medicine and Rehabilitation Consult Reason for Consult: Debilitation related to acute respiratory failure Referring Physician: Critical care   HPI: Jared [name] is a 59 y.o. right handed male with hypertension, chronic back pain. Presented 01/08/2015 after being found down at home by his wife. In the ED he was profoundly hypotensive with elevated WBC. He was started on pressors in the ED. Complaints of severe abdominal pain. Chest x-ray with no pulmonary edema or pleural effusions. Troponin mildly elevated 1.13. Alcohol level negative. Findings of hyperkalemia 6.4 as well as elevated creatinine 4.95. He was found to be severely acidotic. His abdomen was distended diffusely tender. Underwent exploratory laparotomy for probable ischemic bowel placement of abdominal VAC 01/08/2015 per Dr.Tsuei. Renal ultrasound showed chronic medial renal disease. Patient was intubated. Renal service was consulted for acute renal insufficiency felt to be related to shock and hemodialysis initiated. Echocardiogram with ejection fraction of 45-50% moderate to severe RV dysfunction. Bilateral lower extremity Dopplers negative for DVT. Later underwent closure of open abdominal wound 01/11/2015. Cardiology follow-up for elevated troponin and underwent cardiac catheterization 01/13/2015 findings apparent occluded large branch of obtuse marginal comp care by A. fib placed on amiodarone.. TPN initiated a diet slowly advanced with honey liquids. Patient was extubated 01/18/2015 and physical therapy evaluation completed 01/19/2015 with recommendations of physical medicine rehabilitation consult   Review of Systems  Constitutional: Negative for fever and chills.  Respiratory: Negative for cough and shortness of breath.   Cardiovascular: Positive for chest pain, palpitations and leg swelling.  Gastrointestinal: Positive for nausea, abdominal pain and constipation.  Genitourinary: Positive for urgency and frequency.    Musculoskeletal: Positive for back pain.  Skin: Negative for rash.  Neurological: Positive for dizziness and weakness. Negative for seizures and headaches.  Psychiatric/Behavioral: Positive for depression.       Anxiety   Past Medical History  Diagnosis Date  . Chest pain   . HTN (hypertension)   . Hyperlipemia   . DJD (degenerative joint disease)   . BPH (benign prostatic hypertrophy)   . Obesity   . OSA (obstructive sleep apnea)   . Hypercholesteremia   . Chronic back pain   . Anxiety   . Depression    Past Surgical History  Procedure Laterality Date  . Back surgery    . Prostatectomy    . Cervical spine surgery    . Laparotomy N/A 01/08/2015    Procedure: EXPLORATORY LAPAROTOMY;  Surgeon: Manus Rudd, MD;  Location: [facility] OR;  Service: General;  Laterality: N/A;  . Application of wound vac N/A 01/08/2015    Procedure: APPLICATION OF WOUND VAC;  Surgeon: Manus Rudd, MD;  Location: [facility] OR;  Service: General;  Laterality: N/A;  . Wound debridement N/A 01/11/2015    Procedure: DEBRIDEMENT WOUND AND VAC DRESSING CHANGE;  Surgeon: Manus Rudd, MD;  Location: [facility] OR;  Service: General;  Laterality: N/A;  . Cardiac catheterization N/A 01/13/2015    Procedure: Left Heart Cath and Coronary Angiography;  Surgeon: Corky Crafts, MD;  Location: [facility] INVASIVE CV LAB;  Service: Cardiovascular;  Laterality: N/A;   History reviewed. No pertinent family history. Social History:  reports that he has quit smoking. He does not have any smokeless tobacco history on file. He reports that he does not drink alcohol or use illicit drugs. Allergies:  Allergies  Allergen Reactions  . Morphine And Related Anaphylaxis and Shortness Of Breath  . Montelukast Other (See Comments)    unknown  . Neurontin [Gabapentin]  Other (See Comments)    delusional  . Wellbutrin [Bupropion] Palpitations    Insomnia   Medications Prior to Admission  Medication Sig Dispense Refill  . albuterol (PROVENTIL  HFA;VENTOLIN HFA) 108 (90 BASE) MCG/ACT inhaler Inhale 2 puffs into the lungs every 2 (two) hours as needed for wheezing or shortness of breath (cough). 1 Inhaler 2  . ALPRAZolam (XANAX) 1 MG tablet TAKE 1/2-1 TABLET BY MOUTH 3 TIMES DAILY 90 tablet 1  . atorvastatin (LIPITOR) 40 MG tablet TAKE 1 TABLET BY MOUTH DAILY OR AS DIRECTED FOR CHOLESTEROL (Patient taking differently: TAKE 1 TABLET BY MOUTH every evening OR AS DIRECTED FOR CHOLESTEROL) 90 tablet 3  . bisoprolol-hydrochlorothiazide (ZIAC) 10-6.25 MG per tablet Take 1 tablet by mouth daily.    . Cholecalciferol (VITAMIN D-3) 5000 UNITS TABS Take 2,000 Units by mouth daily.     . citalopram (CELEXA) 40 MG tablet TAKE 1 TABLET BY MOUTH EVERY DAY FOR MOOD 90 tablet 0  . docusate sodium (COLACE) 100 MG capsule Take 100 mg by mouth daily as needed for mild constipation.     . Flaxseed, Linseed, (FLAXSEED OIL PO) Take 1 tablet by mouth daily.      . fluticasone (FLONASE) 50 MCG/ACT nasal spray USE 1 TO 2 SPRAYS IN EACHNOSTRIL ONCE DAILY (Patient taking differently: USE 1 TO 2 SPRAYS IN EACHNOSTRIL ONCE DAILY as needed) 16 g 99  . losartan-hydrochlorothiazide (HYZAAR) 100-25 MG per tablet TAKE 1 TABLET BY MOUTH EVERY DAY 90 tablet 0  . minoxidil (LONITEN) 10 MG tablet Take 1 tablet daily or as directed for BP (Patient taking differently: Take 5 mg by mouth at bedtime. ) 90 tablet 99  . Omega-3 Fatty Acids (FISH OIL BURP-LESS PO) Take 1 tablet by mouth daily.      Marland Kitchen oxycodone (ROXICODONE) 30 MG immediate release tablet Take 60 mg by mouth every 4 (four) hours as needed for pain.     . pregabalin (LYRICA) 75 MG capsule Take 1 to 2 capsules 1 hour before sleep 14 capsule 0  . senna (SENOKOT) 8.6 MG TABS tablet Take 4 tablets by mouth at bedtime.    Marland Kitchen azelastine (ASTELIN) 0.1 % nasal spray 1 to 2 sprays each nostril every 12 hours (Patient taking differently: Place 1-2 sprays into both nostrils 2 (two) times daily as needed for allergies. 1 to 2 sprays  each nostril every 12 hours) 30 mL 99  . ondansetron (ZOFRAN) 8 MG tablet 1/2 to 1 tablet up to 3 x daily as needed for nausea. 30 tablet 3  . testosterone cypionate (DEPOTESTOTERONE CYPIONATE) 200 MG/ML injection Inject 2 cc IM every 2 weeks or as directed by a physician. (Patient taking differently: Inject into the muscle every 28 (twenty-eight) days. Inject 2 cc IM every monthly or as directed by a physician.) 10 mL 2  . [DISCONTINUED] pregabalin (LYRICA) 50 MG capsule Take 1 to 3 capsules 1 hour before sleep (Patient not taking: Reported on 01/08/2015) 21 capsule 0    Home: Home Living Family/patient expects to be discharged to:: Inpatient rehab Additional Comments: Pt was unable to provide details of home environment. Only able to report that he lives with his wife.  Functional History: Prior Function Comments: Pt unable to report PLOF however it is likely he was independent.  Functional Status:  Mobility: Bed Mobility Overal bed mobility: Needs Assistance, +2 for physical assistance, + 2 for safety/equipment Bed Mobility: Supine to Sit Supine to sit: Total assist, +2 for physical assistance, HOB  elevated General bed mobility comments: Pt required total assist for all aspects of bed mobility. Bed pad used to assist with achieving EOB. Hand-over-hand assist required for reaching and hand placement on seated surface for balance.  Transfers Overall transfer level: Needs assistance Equipment used: Rolling walker (2 wheeled) Transfers: Sit to/from Stand, Stand Pivot Transfers Sit to Stand: Max assist, +2 physical assistance, From elevated surface Stand pivot transfers: Max assist, +2 physical assistance, +2 safety/equipment General transfer comment: Pt required +2 assist to power-up to full standing position. Increased time for pt to improve posture and be able to grab the walker for support. Specific and direct verbal cues required for pt to follow commands. Pt with 2 episodes of knees  buckling, however was able to take small pivotal steps around to the recliner. Pt had difficulty turning around fully and chair was brought up the rest of the way for pt to sit safely. Ambulation/Gait General Gait Details: Unable at this time.     ADL:    Cognition: Cognition Overall Cognitive Status: Impaired/Different from baseline Orientation Level: Oriented X4 Cognition Arousal/Alertness: Lethargic Behavior During Therapy: Flat affect Overall Cognitive Status: Impaired/Different from baseline Area of Impairment: Orientation, Attention, Following commands, Safety/judgement, Awareness, Problem solving Current Attention Level: Focused Memory: Decreased short-term memory Following Commands: Follows one step commands with increased time, Follows one step commands inconsistently Safety/Judgement: Decreased awareness of safety, Decreased awareness of deficits Awareness: Intellectual Problem Solving: Slow processing, Decreased initiation, Difficulty sequencing, Requires verbal cues, Requires tactile cues General Comments: Pt answered questions inconsistently. When asked orientation questions, pt appeared to be thinking hard about what he was going to say, but then never actually answered and fell back asleep.  Blood pressure 115/61, pulse 119, temperature 98.2 F (36.8 C), temperature source Oral, resp. rate 24, weight 130.6 kg (287 lb 14.7 oz), SpO2 96 %. Physical Exam  Constitutional:  59 year old right-handed obese Caucasian male  HENT:  Head: Normocephalic.  Eyes: EOM are normal.  Neck: Normal range of motion. Neck supple. No thyromegaly present.  Cardiovascular:  Cardiac rate control  Respiratory:  Decreased breath sounds at the bases with limited inspiratory effort  GI:  Abdomen is mildly distended and tender at surgical site with dry dressing in place positive bowel sounds  Neurological:  Alert. Oriented to self, hospital. Cannot tell me month or year even with choices  provided. Very distracted, needs extra time to process simple information. BUE: 3/5 deltoid, 3+ bicep,tricep, 4- wrist/fingers. LE: 2+ hf, 3-ke and 3 adf/apf. Marland Kitchen   Psychiatric:  Confused, cooperative, pleasant    Results for orders placed or performed during the hospital encounter of 01/08/15 (from the past 24 hour(s))  Glucose, capillary     Status: Abnormal   Collection Time: 01/18/15  4:29 PM  Result Value Ref Range   Glucose-Capillary 117 (H) 65 - 99 mg/dL  Glucose, capillary     Status: Abnormal   Collection Time: 01/18/15  7:48 PM  Result Value Ref Range   Glucose-Capillary 108 (H) 65 - 99 mg/dL  Glucose, capillary     Status: None   Collection Time: 01/18/15 11:50 PM  Result Value Ref Range   Glucose-Capillary 92 65 - 99 mg/dL   Comment 1 Notify RN   Magnesium     Status: None   Collection Time: 01/19/15  4:36 AM  Result Value Ref Range   Magnesium 2.3 1.7 - 2.4 mg/dL  Comprehensive metabolic panel     Status: Abnormal   Collection Time: 01/19/15  4:36 AM  Result Value Ref Range   Sodium 132 (L) 135 - 145 mmol/L   Potassium 4.4 3.5 - 5.1 mmol/L   Chloride 99 (L) 101 - 111 mmol/L   CO2 17 (L) 22 - 32 mmol/L   Glucose, Bld 97 65 - 99 mg/dL   BUN 161 (H) 6 - 20 mg/dL   Creatinine, Ser 09.60 (H) 0.61 - 1.24 mg/dL   Calcium 8.4 (L) 8.9 - 10.3 mg/dL   Total Protein 5.0 (L) 6.5 - 8.1 g/dL   Albumin 1.9 (L) 3.5 - 5.0 g/dL   AST 44 (H) 15 - 41 U/L   ALT 183 (H) 17 - 63 U/L   Alkaline Phosphatase 80 38 - 126 U/L   Total Bilirubin 5.2 (H) 0.3 - 1.2 mg/dL   GFR calc non Af Amer 4 (L) >60 mL/min   GFR calc Af Amer 4 (L) >60 mL/min   Anion gap 16 (H) 5 - 15  Phosphorus     Status: Abnormal   Collection Time: 01/19/15  4:36 AM  Result Value Ref Range   Phosphorus 7.0 (H) 2.5 - 4.6 mg/dL  CBC     Status: Abnormal   Collection Time: 01/19/15  4:36 AM  Result Value Ref Range   WBC 12.3 (H) 4.0 - 10.5 K/uL   RBC 3.49 (L) 4.22 - 5.81 MIL/uL   Hemoglobin 10.6 (L) 13.0 - 17.0 g/dL    HCT 45.4 (L) 09.8 - 52.0 %   MCV 87.7 78.0 - 100.0 fL   MCH 30.4 26.0 - 34.0 pg   MCHC 34.6 30.0 - 36.0 g/dL   RDW 11.9 (H) 14.7 - 82.9 %   Platelets 208 150 - 400 K/uL  Differential     Status: Abnormal   Collection Time: 01/19/15  4:36 AM  Result Value Ref Range   Neutrophils Relative % 85 (H) 43 - 77 %   Neutro Abs 10.5 (H) 1.7 - 7.7 K/uL   Lymphocytes Relative 5 (L) 12 - 46 %   Lymphs Abs 0.6 (L) 0.7 - 4.0 K/uL   Monocytes Relative 8 3 - 12 %   Monocytes Absolute 1.0 0.1 - 1.0 K/uL   Eosinophils Relative 2 0 - 5 %   Eosinophils Absolute 0.2 0.0 - 0.7 K/uL   Basophils Relative 0 0 - 1 %   Basophils Absolute 0.0 0.0 - 0.1 K/uL  Prealbumin     Status: None   Collection Time: 01/19/15  4:36 AM  Result Value Ref Range   Prealbumin 24.5 18 - 38 mg/dL  Heparin level (unfractionated)     Status: Abnormal   Collection Time: 01/19/15  6:34 AM  Result Value Ref Range   Heparin Unfractionated 0.21 (L) 0.30 - 0.70 IU/mL  Glucose, capillary     Status: Abnormal   Collection Time: 01/19/15 11:27 AM  Result Value Ref Range   Glucose-Capillary 108 (H) 65 - 99 mg/dL   Dg Chest Port 1 View  01/18/2015   CLINICAL DATA:  Endotracheal tube.  Shortness of breath.  EXAM: PORTABLE CHEST - 1 VIEW  COMPARISON:  1 day prior  FINDINGS: Right internal jugular Cordis sheath unchanged. Lower cervical spine fixation. Left internal jugular line tip at high SVC. Endotracheal and nasogastric tubes have been removed. Cardiomegaly accentuated by AP portable technique. Improved to resolved pleural effusions. No pneumothorax. No congestive failure. Improved bibasilar airspace disease.  IMPRESSION: Improved aeration with decreased to resolved pleural effusions and decreased bibasilar atelectasis.  Cardiomegaly without congestive  failure.  Extubation and removal of nasogastric tube.   Electronically Signed   By: Jeronimo Greaves M.D.   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 Does the need for close, 24 hr/day medical supervision in concert with the patient's rehab needs make it unreasonable for this patient to be served in a less intensive setting? Yes 2. Co-Morbidities requiring supervision/potential complications: ARF, AKI, htn 3. Due to bladder management, bowel management, safety, skin/wound care, disease management, medication administration, pain management and patient education, does the patient require 24 hr/day rehab nursing? Yes 4. Does the patient require coordinated care of a physician, rehab nurse, PT (1-2 hrs/day, 5 days/week), OT (1-2 hrs/day, 5 days/week) and SLP (1-2 hrs/day, 5 days/week) to address physical and functional deficits in the context of the above medical diagnosis(es)? Yes Addressing deficits in the following areas: balance, endurance, locomotion, strength, transferring, bowel/bladder control, bathing, dressing, feeding, grooming, toileting, cognition, language and psychosocial support 5. Can the patient actively participate in an intensive therapy program of at least 3 hrs of therapy per day at least 5 days per week? Yes 6. The potential for patient to make measurable gains while on inpatient rehab is excellent 7. Anticipated functional outcomes upon discharge from inpatient rehab are supervision  with PT, supervision with OT, supervision with SLP. 8. Estimated rehab length of stay to reach the above functional goals is: 14-17 days 9. Does the patient have adequate social supports and living environment to accommodate these discharge functional goals? Yes and Potentially 10. Anticipated D/C setting: Home 11. Anticipated post D/C treatments: HH therapy 12. Overall Rehab/Functional Prognosis: excellent  RECOMMENDATIONS: This patient's condition is appropriate for continued rehabilitative care in the following setting: CIR Patient has agreed to participate in recommended program. Potentially Note  that insurance prior authorization may be required for reimbursement for recommended care.  Comment: Rehab Admissions Coordinator to follow up.  Thanks,  Ranelle Oyster, MD, [place] [facility]     01/19/2015

## 2015-01-19 NOTE — Progress Notes (Signed)
ANTICOAGULATION CONSULT NOTE - Follow-up Consult  Pharmacy Consult for Heparin Indication: pulmonary embolus  Allergies  Allergen Reactions  . Morphine And Related Anaphylaxis and Shortness Of Breath  . Montelukast Other (See Comments)    unknown  . Neurontin [Gabapentin] Other (See Comments)    delusional  . Wellbutrin [Bupropion] Palpitations    Insomnia    Patient Measurements: Weight: 292 lb 8.8 oz (132.7 kg) Heparin Dosing Weight: 111.75 kg  Vital Signs: Temp: 98 F (36.7 C) (08/29 0500) Temp Source: Oral (08/29 0500) BP: 156/70 mmHg (08/29 0500) Pulse Rate: 93 (08/29 0500)  Labs:  Recent Labs  01/17/15 0400 01/18/15 0445 01/19/15 0436 01/19/15 0634  HGB 11.4* 10.9* 10.6*  --   HCT 33.0* 31.4* 30.6*  --   PLT 122* 146* 208  --   HEPARINUNFRC  --   --   --  0.21*  CREATININE 8.59* 11.19* 13.12*  --     Estimated Creatinine Clearance: 8.6 mL/min (by C-G formula based on Cr of 13.12).   Assessment: 59 y.o. male on heparin for potential PE. Heparin level subtherapeutic (0.21) on 2100 units/hr. RN states that heparin off from ~1800-2400 due to oozing blood from wound. Bleeding resolved as of now.   Goal of Therapy:  Heparin level 0.3-0.7 units/ml Monitor platelets by anticoagulation protocol: Yes   Plan:  -Increase heparin to 2300 units/hr -Check 8 hr HL -Monitor closely for further bleeding  Christoper Fabian, PharmD, BCPS Clinical pharmacist, pager [phone] 01/19/2015 7:00 AM

## 2015-01-19 NOTE — Evaluation (Signed)
Clinical/Bedside Swallow Evaluation Patient Details  Name: Jared [name] MRN: [id number] Date of Birth: 1956/04/25  Today's Date: 01/19/2015 Time: SLP Start Time (ACUTE ONLY): 0840 SLP Stop Time (ACUTE ONLY): 0905 SLP Time Calculation (min) (ACUTE ONLY): 25 min  Past Medical History:  Past Medical History  Diagnosis Date  . Chest pain   . HTN (hypertension)   . Hyperlipemia   . DJD (degenerative joint disease)   . BPH (benign prostatic hypertrophy)   . Obesity   . OSA (obstructive sleep apnea)   . Hypercholesteremia   . Chronic back pain   . Anxiety   . Depression    Past Surgical History:  Past Surgical History  Procedure Laterality Date  . Back surgery    . Prostatectomy    . Cervical spine surgery    . Laparotomy N/A 01/08/2015    Procedure: EXPLORATORY LAPAROTOMY;  Surgeon: Manus Rudd, MD;  Location: [facility] OR;  Service: General;  Laterality: N/A;  . Application of wound vac N/A 01/08/2015    Procedure: APPLICATION OF WOUND VAC;  Surgeon: Manus Rudd, MD;  Location: [facility] OR;  Service: General;  Laterality: N/A;  . Wound debridement N/A 01/11/2015    Procedure: DEBRIDEMENT WOUND AND VAC DRESSING CHANGE;  Surgeon: Manus Rudd, MD;  Location: [facility] OR;  Service: General;  Laterality: N/A;  . Cardiac catheterization N/A 01/13/2015    Procedure: Left Heart Cath and Coronary Angiography;  Surgeon: Corky Crafts, MD;  Location: [facility] INVASIVE CV LAB;  Service: Cardiovascular;  Laterality: N/A;   HPI:  Pt is a 59 year old male with a PMH of chronic back pain, chest pain, hypertension, hyperlipemia, obesity, obstructive sleep apnea, hypercholesteremia, and depression. Pt was found down at home by wife on 01/08/15. Pt was intubated due to acute respiratory failure. Pt had an exploratory laparotomy, cardiac catheterization, and received dialysis treatment. Pt's primary issue are pulmonary embolism and difficulty with his abdominal wound. Pt's active problems are spetic shock, acute  respiratory failure with hypoxia, acute respiratory failure with hypoxemia, cardigenic shock, acute kidney injury, and renal failure. Pt was extubated on 01/18/15 and was unable to be seen by SLP due to medical issues which prohibited therapy, RN reports pt not medically ready. Pt is currently NPO but can have ice chips.    Assessment / Plan / Recommendation Clinical Impression  Pt is alert and oriented to place but fatigued during assessment. Pt demonstrates a high risk of silent aspiration due to his 10 day intubation period. Pt tolerated ice chips, water, and apple sauce with no signs or symptoms of aspiration. Pt requires total assist to feed due to generalized weakness. BSE was discontinued when pt reported being tired. SLP recommends nectar-thick liquids, thin water, and ice chips with full supervision by RN to reduce risk of aspiration during acute recovery. Prognosis for recovery is good as patient's overall health improves.     Aspiration Risk  Moderate    Diet Recommendation Free water protocol after oral care;Ice chips PRN after oral care   Medication Administration: Crushed with puree    Other  Recommendations Oral Care Recommendations: Oral care QID   Follow Up Recommendations    CIR   Frequency and Duration min 2x/week  2 weeks   Pertinent Vitals/Pain NA    SLP Swallow Goals     Swallow Study Prior Functional Status       General Other Pertinent Information: Pt is a 59 year old male with a PMH of chronic back pain,  chest pain, hypertension, hyperlipemia, obesity, obstructive sleep apnea, hypercholesteremia, and depression. Pt was found down at home by wife on 01/08/15. Pt was intubated due to acute respiratory failure. Pt had an exploratory laparotomy, cardiac catheterization, and received dialysis treatment. Pt's primary issue are pulmonary embolism and difficulty with his abdominal wound. Pt's active problems are spetic shock, acute respiratory failure with hypoxia, acute  respiratory failure with hypoxemia, cardigenic shock, acute kidney injury, and renal failure. Pt was extubated on 01/18/15 and was unable to be seen by SLP due to medical issues which prohibited therapy, RN reports pt not medically ready. Pt is currently NPO but can have ice chips.  Type of Study: Bedside swallow evaluation Diet Prior to this Study: NPO (and ice chips) Temperature Spikes Noted: Yes Respiratory Status: Supplemental O2 delivered via (comment) History of Recent Intubation: Yes Length of Intubations (days): 10 days Date extubated: 01/18/15 Behavior/Cognition: Alert;Cooperative Oral Cavity - Dentition: Adequate natural dentition/normal for age Self-Feeding Abilities: Total assist Patient Positioning: Upright in bed Baseline Vocal Quality: Hoarse (mildly hoarse) Volitional Cough: Strong;Congested Volitional Swallow: Able to elicit    Oral/Motor/Sensory Function Overall Oral Motor/Sensory Function: Appears within functional limits for tasks assessed   Ice Chips Ice chips: Impaired Presentation: Spoon;Self Fed (total assist) Pharyngeal Phase Impairments: Cough - Immediate (1x)   Thin Liquid Thin Liquid: Within functional limits Presentation: Straw;Cup (total assist)    Nectar Thick Nectar Thick Liquid: Not tested   Honey Thick Honey Thick Liquid: Not tested   Puree Puree: Within functional limits Presentation: Spoon   Solid   GO   Riccardo Dubin, Student-SLP Solid: Not tested       Riccardo Dubin 01/19/2015,9:41 AM

## 2015-01-19 NOTE — Care Management Note (Signed)
Case Management Note  Patient Details  Name: BECKETT STALLMAN MRN: [id number] Date of Birth: [birth year]  Subjective/Objective:      Extubated yesterday.  Ltach referral placed last week.    [facility] with no dialysis, [facility], offering a bed.  Dr. Molli Knock updated.  For HD today then tx to intermediate care and triad services.  No perm cath yet for suspected long term dialysis.  Continues on TPN but did start clear liquids.              Action/Plan:   Expected Discharge Date:                  Expected Discharge Plan:  Long Term Acute Care (LTAC)  In-House Referral:     Discharge planning Services  CM Consult  Post Acute Care Choice:    Choice offered to:     DME Arranged:    DME Agency:     HH Arranged:    HH Agency:     Status of Service:  In process, will continue to follow  Medicare Important Message Given:    Date Medicare IM Given:    Medicare IM give by:    Date Additional Medicare IM Given:    Additional Medicare Important Message give by:     If discussed at Long Length of Stay Meetings, dates discussed:    Additional Comments:  Vangie Bicker, RN 01/19/2015, 2:41 PM

## 2015-01-19 NOTE — Progress Notes (Signed)
PARENTERAL NUTRITION CONSULT NOTE - Follow-up  Pharmacy Consult for TPN Indication: Prolonged Ileus  Allergies  Allergen Reactions  . Morphine And Related Anaphylaxis and Shortness Of Breath  . Montelukast Other (See Comments)    unknown  . Neurontin [Gabapentin] Other (See Comments)    delusional  . Wellbutrin [Bupropion] Palpitations    Insomnia    Patient Measurements: Weight: 292 lb 8.8 oz (132.7 kg) Usual Weight: 138 kg on admission (8/20)  Vital Signs: Temp: 98 F (36.7 C) (08/29 0804) Temp Source: Oral (08/29 0804) BP: 164/84 mmHg (08/29 0900) Pulse Rate: 105 (08/29 0900) Intake/Output from previous day: 08/28 0701 - 08/29 0700 In: 2873.8 [I.V.:473.8; TPN:2400] Out: 340 [Urine:340] Intake/Output from this shift: Total I/O In: 263.7 [I.V.:63.7; TPN:200] Out: 17 [Urine:17]  Labs:  Recent Labs  01/17/15 0400 01/18/15 0445 01/19/15 0436  WBC 10.5 11.5* 12.3*  HGB 11.4* 10.9* 10.6*  HCT 33.0* 31.4* 30.6*  PLT 122* 146* 208     Recent Labs  01/17/15 0400 01/18/15 0445 01/19/15 0436  NA 137 134* 132*  K 4.1 4.6 4.4  CL 104 103 99*  CO2 22 19* 17*  GLUCOSE 128* 124* 97  BUN 67* 91* 110*  CREATININE 8.59* 11.19* 13.12*  CALCIUM 7.9* 8.2* 8.4*  MG 2.4 2.4 2.3  PHOS 4.1 6.1* 7.0*  PROT  --   --  5.0*  ALBUMIN 2.0* 1.8* 1.9*  AST  --   --  44*  ALT  --   --  183*  ALKPHOS  --   --  80  BILITOT  --   --  5.2*  PREALBUMIN  --   --  24.5  TRIG 323*  --   --    Estimated Creatinine Clearance: 8.6 mL/min (by C-G formula based on Cr of 13.12).    Recent Labs  01/18/15 1629 01/18/15 1948 01/18/15 2350  GLUCAP 117* 108* 92   Insulin Requirements in the past 24 hours:  1 units SSI last 24h  Current Nutrition:  Clinimix 5/15 at 130ml/hr  Assessment: 59 yo M presents on 8/18 after being found down at home. Was responsive to Narcan and then found to be hypotensive with elevated WBC count in ED. Went to surgery on 8/18 for ex lap but everything  looked ok. Being treated for acute resp failure, RV failure with presumed PE, ATN with metabolic acidosis.   Surgeries/Procedures:  8/18: Ex lap: no ischemic bowel or perforation was found.  8/21 went back to OR for closure of abdominal fascia and placement of wound VAC.  GI: Prolonged Ileus, continuing TPN until ileus resolves. PPI IV. No stool noted. +flatus.  Prealbumin 12.8>24.5. Try clear liquids   Endo: CBGs well controlled with minimal SSI (<125)  Lytes: Na 132, K 4.4, Phos elevated 7 (likely related to renal dysfunction since pt is not receiving any electrolytes in the TPN), Mg 2.3, CoCa 10.08 (phos x Ca > 55) - (Goal K > 4.0 and Mg > 2.0).   Renal: AKI/CKD2, Was on CRRT but stopped 8/24. Started on iHD 8/26 prn.  UOP 340 ml yesterday. Still hoping for renal recovery. Worsening metabolic acidosis Gap 16 up. HD 8/29  Pulm: Intubated 8/18>>8/28 - currently 94% 2L [place]  Cards: RV failure with presumed PE. 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  HIT panel negative. Stopped due to bleeding. CT on hold due to concerns for renal recovery. Cath=occluded OM. Hgb 10.9, plts low at 146 but increasing, ASA supp.Heparin  drip resumed. Some oozing from wound noted last PM. IV Lasix  Hepatobil: LFTs elevated but trending down. Albumin low at 1.9. Tbili 5.2 remains elevated (no jaundice), trigs 323  Neuro: RASS 0  ID: Sepsis with presumed abdmonial source. Afebrile, WBC 12.3 up  8/18 Blood cx >NEG 8/18 Urine cx >NEG  8/18 Vancomycin >> 8/23 8/18 Zosyn >>8/27  Best Practices: SCDs, heparin IV  TPN Access: 8/20 - CVC triple lumen  TPN start date: 8/25 >>  Nutritional Goals: per RD on 8/25 KCal: 4481-8563 Protein: >175 g Fluid: 2 L  Plan:  - Continue clinimix 5/15 (no electrolytes) at 165ml/hr - provides 120gm protein (69% goal) and 1704 kcal (100% goal) - will be unable to meet protein requirements d/t limitations of pre-mixed Clinimix bags - Hold IVFE for first 7  days for ICU patients per ASPEN guidelines (Day #4) - Continue MVI and trace elements in TPN (monitor for jaundice) - Continue sensitive SSI  - Clear liquids today    Tiara Maultsby S. Merilynn Finland, PharmD, [facility] Clinical Staff Pharmacist Pager [phone]  Misty Stanley Stillinger 01/19/2015,9:50 AM

## 2015-01-19 NOTE — Progress Notes (Signed)
PULMONARY / CRITICAL CARE MEDICINE   Name: Jared [name] MRN: [id number] DOB: 06-23-1955    ADMISSION DATE:  01/08/2015 CONSULTATION DATE:  8/18  REFERRING MD :  EDP Dr Criss Alvine  CHIEF COMPLAINT:  Found down  INITIAL PRESENTATION:   59 yo male had severe abdominal pain, and then found unresponsive at home.  In ER he was in shock, cyanotic, with lactic acidosis, and hyperkalemia.  STUDIES:  8/18 Echo >> EF 45 to 50%, mod RV dilation, mod/severe RV dysfx, PAS 47 mmHg 8/18 Renal u/s >> b/l renal echogenicity  SIGNIFICANT EVENTS: 8/18 Admit, CCS, cardiology consulted; brief PEA arrest; laparotomy with placement of wound vac; started heparin gtt for presumed PE 8/19 Renal consulted. Duplex LE negative for DVT 8/20 CRRT  8/21 OR for abd wound closure    8/21 :  To OR this am with abd wound closure , did well . CRRT started yesterday , no change in scr or UOP.  Bradycardia overnight with Amio stopped. HR improved but remains in upper 40s. Remains on pressors with Levo and Vaso , decreased demands    01/12/15; For cath later 01/12/2015 per Dr Jomarie Longs. On pressors. Anuric. On CRRT. Off pressors. Improved Shock Liver. On fent gtt and heparing gtt. 40% fio2   01/13/15: Cath delayed to 01/13/2015. Off pressors. On CRRT. On fent gtt . Off heparin gtt -> due to dropping platelet. HITT panel sent. RN reports significant abd tenderness. CCS reports patient has ileys    01/14/15: CCS reports ongoing ileus but planning TF in 1-2days. Offpressors. Ongong CRRT. Cath relatively clean except occlusion of OM branch - not felt to be primary cause of current illness. This is per renal note. No cath note seen in EMR.  On dilaudid and diprivan gtt for sedation.    Primarhy dx stll unclear and is on empiric Bivalirudin (angiomax) for PE - HITT panel pending. DUplex LE - repeat negative for DVT . RN reports bleeding around oral cavity. Platelets at 86K  01/15/15 - ANgiomax on hold x 24h due to oral bleeding.  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 Platelt around 90K. TNA being initiated 01/15/2015 due to ileus. Anuria with minimal urine otuput. CTA did not happen yesterday due to PIV issue. Today d/w renal - risk of long term anticoagulation for empiric PE better than long term HD. SEdated with diprivan and dilaudid   01/16/15: Bleeding around abd wound - surface bled and IV heparin held yesterday. Still hgih NG returns and on TNA. On dilaudid and versed gtt. Lot of pain and agitation. Tolerating intemittent HD - 2L volume removal. Making very little urine  01/17/15 - hypertensive - on hydralazine prn. Got HD . Anuric. Did get versed for agitation during dressing change. Being weaned off dilaudid gtt by RN. Doing SBT Following some commands. NG output improved - CCS plan continue TPN. Not on pressors  01/18/15 -  Bedside round with RN and family:  Off dilaudid gtt. Off precedex gtt. Extubated  overnight by eMD. Colvin Caroli on TPN. CCS encouraging ice-chips. OG came out with ET tube. Still needing dilaidudi prn for chronic back pain (on opioids at home). Not on IV heparin yet - on Sq heparin x 24h. No bleeding anywhere that is obvious. Still with a-line. Wife and son at bedside.   SUBJECTIVE/OVERNIGHT/INTERVAL HX No events overnight.  VITAL SIGNS: Temp:  [98 F (36.7 C)-100.2 F (37.9 C)] 98 F (36.7 C) (08/29 0804) Pulse Rate:  [84-111] 105 (08/29 0900) Resp:  [17-29] 23 (08/29  0900) BP: (110-164)/(52-84) 164/84 mmHg (08/29 0900) SpO2:  [94 %-99 %] 94 % (08/29 0900) Weight:  [132.7 kg (292 lb 8.8 oz)] 132.7 kg (292 lb 8.8 oz) (08/29 0500) HEMODYNAMICS:   VENTILATOR SETTINGS:   INTAKE / OUTPUT:  Intake/Output Summary (Last 24 hours) at 01/19/15 1113 Last data filed at 01/19/15 0900  Gross per 24 hour  Intake 2697.53 ml  Output    337 ml  Net 2360.53 ml   Total I/O In: 263.7 [I.V.:63.7; TPN:200] Out: 17 [Urine:17]  I/O last 3 completed shifts: In: 4555.2 [I.V.:925.2; NG/GT:30] Out: 415  [Urine:415]  PHYSICAL EXAMINATION: General: chronic critically ill appearing - looking much better Neuro: RASS 0. CAM-ICU negative for delirium. Slow deconditioned speech. Moves all 4s Psych: thankful,. HEENT: Hoarse voice, Lip blistser + Cardiovascular: SB , no murmur Lungs: faint basilar crackles Abdomen: mild distention, wound vac in place, tenderness diffusely + but improved siginifcantly - as of 01/18/2015 Musculoskeletal: no edema  LABS:  PULMONARY No results for input(s): PHART, PCO2ART, PO2ART, HCO3, TCO2, O2SAT in the last 168 hours.  Invalid input(s): PCO2, PO2  CBC  Recent Labs Lab 01/17/15 0400 01/18/15 0445 01/19/15 0436  HGB 11.4* 10.9* 10.6*  HCT 33.0* 31.4* 30.6*  WBC 10.5 11.5* 12.3*  PLT 122* 146* 208   COAGULATION No results for input(s): INR in the last 168 hours.  CARDIAC   Recent Labs Lab 01/15/15 0442  TROPONINI 0.99*   No results for input(s): PROBNP in the last 168 hours.  CHEMISTRY  Recent Labs Lab 01/15/15 0442 01/16/15 0340 01/16/15 1240 01/17/15 0400 01/18/15 0445 01/19/15 0436  NA 136  137 134* 138 137 134* 132*  K 3.9  3.9 3.7 4.1 4.1 4.6 4.4  CL 102  103 101 104 104 103 99*  CO2 21*  22 20* 24 22 19* 17*  GLUCOSE 81  79 109* 125* 128* 124* 97  BUN 58*  58* 79* 49* 67* 91* 110*  CREATININE 6.51*  6.50* 9.20* 6.65* 8.59* 11.19* 13.12*  CALCIUM 7.9*  8.0* 7.8* 7.8* 7.9* 8.2* 8.4*  MG 2.9* 2.9*  --  2.4 2.4 2.3  PHOS 4.3  4.3 5.4*  --  4.1 6.1* 7.0*   Estimated Creatinine Clearance: 8.6 mL/min (by C-G formula based on Cr of 13.12).  LIVER  Recent Labs Lab 01/15/15 0442 01/16/15 0340 01/17/15 0400 01/18/15 0445 01/19/15 0436  AST 93*  --   --   --  44*  ALT 573*  --   --   --  183*  ALKPHOS 65  --   --   --  80  BILITOT 5.3*  --   --   --  5.2*  PROT 4.9*  --   --   --  5.0*  ALBUMIN 2.0*  1.9* 1.8* 2.0* 1.8* 1.9*   INFECTIOUS  Recent Labs Lab 01/15/15 0145  LATICACIDVEN 0.8   ENDOCRINE CBG  (last 3)   Recent Labs  01/18/15 1629 01/18/15 1948 01/18/15 2350  GLUCAP 117* 108* 92   IMAGING x48h  - image(s) personally visualized  -   highlighted in bold Dg Chest Port 1 View  01/18/2015   CLINICAL DATA:  Endotracheal tube.  Shortness of breath.  EXAM: PORTABLE CHEST - 1 VIEW  COMPARISON:  1 day prior  FINDINGS: Right internal jugular Cordis sheath unchanged. Lower cervical spine fixation. Left internal jugular line tip at high SVC. Endotracheal and nasogastric tubes have been removed. Cardiomegaly accentuated by AP portable technique. Improved to resolved  pleural effusions. No pneumothorax. No congestive failure. Improved bibasilar airspace disease.  IMPRESSION: Improved aeration with decreased to resolved pleural effusions and decreased bibasilar atelectasis.  Cardiomegaly without congestive failure.  Extubation and removal of nasogastric tube.   Electronically Signed   By: Jeronimo Greaves M.D.   On: 01/18/2015 09:35   CXR: I reviewed myself, HD catheter retracted but otherwise unchanged.  ASSESSMENT / PLAN:  PULMONARY ETT 8/18 >>>8/28 A: Hx of OSA  P:   - Continue IV heparin gtt - HIT panel negative. - Holding off  CTA chest - better risk proile with 1-2 year anticoagulation than higher risk of permanent  HD (wife says HD would be a nightmare for patient pschye) will order a VQ scan. - Pulmonary toilet - O2 for pulse ox > 88%  CARDIOVASCULAR Rt femoral CVL 8/18 >>8/20 Lt femoral A line 8/18 >>8/28 RIJ HD 8/20 >> LIJ 8/20 >>  A: No active issues at this time. Shock >> likely RV failure with possible PE.-although venous doppler neg for DVT  X 2  - Cath 8/06/25/14 OM branch occlusion per report and unlikely cause of admission  - Resolve and off pressors sine 01/12/15 Hospital course  -  Complicated by A Fib. Resolved. Off amio since 01/11/15 Current  -  Hypertensive an needing hydralazine prn    P:  - Hold outpt lipitor, bisoprolol, HCTZ, losartan, minoxidil for now. -  Hydralazine prn.  RENAL  Baseline CKD>> baseline creatinine 1.14 from 12/17/14., BPH   Admit   - ATN with metab acidosis and hyperkalemia - renal US neg  - CRRT started per renal 8/20  - stopped 01/15/15   - On Int HD since 01/16/15 - s/p 1 Rx only as of 01/18/2015. ; Making very little urine as of 01/18/2015  RF and pan autpimmune negative 01/14/15  P:   - HD in AM. - BMET in AM. - Replace electrolytes as indicated.   GASTROINTESTINAL A: Acute abdominal pain s/p laparotomy with wound vac 8/18.>wound vac change 8/19 d/t eviceration>to OR 8/21 with wound closure  Shock Liver >LFT tr down  P:  - Honey thick diet as ordered. 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 - F/u CBC and INR   INFECTIOUS Culture - negative HIV negative 01/14/15 A: Sepsis with presumed abdominal source.>exp lap was neg .   P:   Day 7 vanc completed 01/14/15 Day 10  Zosyn to be stopped 01/17/15  ENDOCRINE A: Hyperglycemia  P:   SSI   NEUROLOGIC A: Baseline - Hx of chronic back pain, anxiety, depression.  Admit Acute metabolic encephalopathy >> improved 8/18.>follows commands off sedation  P:   RASS goal: +1 Dilaudid prn DC precedex gtt from [facility] Use versed prn Hold outpt xanax, celexa, oxycodone, lyrica  FAMILY Updates - Son and daughter updated bedside.  Discussed with bedside RN.  Transfer to SDU and to [facility] service, PCCM off 8/30.  Alyson Reedy, M.D. [facility] Pulmonary/Critical Care Medicine. Pager: [phone]. After hours pager: [phone].  01/19/2015 11:13 AM

## 2015-01-20 DIAGNOSIS — N170 Acute kidney failure with tubular necrosis: Secondary | ICD-10-CM

## 2015-01-20 DIAGNOSIS — K759 Inflammatory liver disease, unspecified: Secondary | ICD-10-CM

## 2015-01-20 DIAGNOSIS — I480 Paroxysmal atrial fibrillation: Secondary | ICD-10-CM

## 2015-01-20 DIAGNOSIS — K7200 Acute and subacute hepatic failure without coma: Secondary | ICD-10-CM | POA: Diagnosis present

## 2015-01-20 DIAGNOSIS — R100 Acute abdomen: Secondary | ICD-10-CM

## 2015-01-20 DIAGNOSIS — I272 Pulmonary hypertension, unspecified: Secondary | ICD-10-CM | POA: Diagnosis present

## 2015-01-20 DIAGNOSIS — I270 Primary pulmonary hypertension: Secondary | ICD-10-CM

## 2015-01-20 DIAGNOSIS — E875 Hyperkalemia: Secondary | ICD-10-CM

## 2015-01-20 DIAGNOSIS — E872 Acidosis, unspecified: Secondary | ICD-10-CM | POA: Diagnosis present

## 2015-01-20 DIAGNOSIS — R109 Unspecified abdominal pain: Secondary | ICD-10-CM | POA: Diagnosis present

## 2015-01-20 DIAGNOSIS — G4733 Obstructive sleep apnea (adult) (pediatric): Secondary | ICD-10-CM

## 2015-01-20 DIAGNOSIS — IMO0001 Reserved for inherently not codable concepts without codable children: Secondary | ICD-10-CM | POA: Diagnosis present

## 2015-01-20 DIAGNOSIS — I5021 Acute systolic (congestive) heart failure: Secondary | ICD-10-CM

## 2015-01-20 DIAGNOSIS — D62 Acute posthemorrhagic anemia: Secondary | ICD-10-CM

## 2015-01-20 DIAGNOSIS — R739 Hyperglycemia, unspecified: Secondary | ICD-10-CM

## 2015-01-20 DIAGNOSIS — D696 Thrombocytopenia, unspecified: Secondary | ICD-10-CM | POA: Diagnosis present

## 2015-01-20 DIAGNOSIS — N189 Chronic kidney disease, unspecified: Secondary | ICD-10-CM

## 2015-01-20 LAB — BASIC METABOLIC PANEL
Anion gap: 15 (ref 5–15)
BUN: 112 mg/dL — AB (ref 6–20)
CALCIUM: 8 mg/dL — AB (ref 8.9–10.3)
CHLORIDE: 97 mmol/L — AB (ref 101–111)
CO2: 17 mmol/L — ABNORMAL LOW (ref 22–32)
CREATININE: 13.86 mg/dL — AB (ref 0.61–1.24)
GFR calc Af Amer: 4 mL/min — ABNORMAL LOW (ref >60)
GFR calc non Af Amer: 3 mL/min — ABNORMAL LOW (ref >60)
Glucose, Bld: 107 mg/dL — ABNORMAL HIGH (ref 65–99)
Potassium: 4.3 mmol/L (ref 3.5–5.1)
SODIUM: 129 mmol/L — AB (ref 135–145)

## 2015-01-20 LAB — HEPARIN LEVEL (UNFRACTIONATED)
HEPARIN UNFRACTIONATED: 0.25 [IU]/mL — AB (ref 0.30–0.70)
HEPARIN UNFRACTIONATED: 0.55 [IU]/mL (ref 0.30–0.70)
Heparin Unfractionated: 0.55 IU/mL (ref 0.30–0.70)

## 2015-01-20 LAB — CBC
HEMATOCRIT: 28.2 % — AB (ref 39.0–52.0)
HEMOGLOBIN: 9.8 g/dL — AB (ref 13.0–17.0)
MCH: 31.2 pg (ref 26.0–34.0)
MCHC: 34.8 g/dL (ref 30.0–36.0)
MCV: 89.8 fL (ref 78.0–100.0)
Platelets: 120 10*3/uL — ABNORMAL LOW (ref 150–400)
RBC: 3.14 MIL/uL — AB (ref 4.22–5.81)
RDW: 16.2 % — ABNORMAL HIGH (ref 11.5–15.5)
WBC: 13.3 10*3/uL — ABNORMAL HIGH (ref 4.0–10.5)

## 2015-01-20 LAB — GLUCOSE, CAPILLARY
GLUCOSE-CAPILLARY: 100 mg/dL — AB (ref 65–99)
GLUCOSE-CAPILLARY: 104 mg/dL — AB (ref 65–99)
GLUCOSE-CAPILLARY: 104 mg/dL — AB (ref 65–99)
GLUCOSE-CAPILLARY: 107 mg/dL — AB (ref 65–99)
GLUCOSE-CAPILLARY: 92 mg/dL (ref 65–99)
GLUCOSE-CAPILLARY: 94 mg/dL (ref 65–99)
Glucose-Capillary: 111 mg/dL — ABNORMAL HIGH (ref 65–99)
Glucose-Capillary: 118 mg/dL — ABNORMAL HIGH (ref 65–99)
Glucose-Capillary: 127 mg/dL — ABNORMAL HIGH (ref 65–99)
Glucose-Capillary: 97 mg/dL (ref 65–99)

## 2015-01-20 LAB — TRIGLYCERIDES: Triglycerides: 213 mg/dL — ABNORMAL HIGH (ref <150)

## 2015-01-20 LAB — PHOSPHORUS: Phosphorus: 7.6 mg/dL — ABNORMAL HIGH (ref 2.5–4.6)

## 2015-01-20 LAB — ALBUMIN: Albumin: 1.9 g/dL — ABNORMAL LOW (ref 3.5–5.0)

## 2015-01-20 LAB — MAGNESIUM: Magnesium: 2.2 mg/dL (ref 1.7–2.4)

## 2015-01-20 MED ORDER — TRACE MINERALS CR-CU-MN-SE-ZN 10-1000-500-60 MCG/ML IV SOLN
INTRAVENOUS | Status: AC
  Administered 2015-01-20: 18:00:00 via INTRAVENOUS
  Filled 2015-01-20: qty 2400

## 2015-01-20 MED ORDER — ACETAMINOPHEN 325 MG PO TABS
ORAL_TABLET | ORAL | Status: AC
  Filled 2015-01-20: qty 2

## 2015-01-20 MED ORDER — VANCOMYCIN HCL IN DEXTROSE 1-5 GM/200ML-% IV SOLN
1000.0000 mg | INTRAVENOUS | Status: DC
  Filled 2015-01-20 (×3): qty 200

## 2015-01-20 MED ORDER — VANCOMYCIN HCL 10 G IV SOLR
2000.0000 mg | Freq: Once | INTRAVENOUS | Status: AC
  Administered 2015-01-20: 2000 mg via INTRAVENOUS
  Filled 2015-01-20: qty 2000

## 2015-01-20 MED ORDER — ACETAMINOPHEN 325 MG PO TABS
650.0000 mg | ORAL_TABLET | Freq: Once | ORAL | Status: AC
  Administered 2015-01-20: 650 mg via ORAL

## 2015-01-20 NOTE — Progress Notes (Signed)
ANTICOAGULATION CONSULT NOTE - Follow Up Consult  Pharmacy Consult for heparin Indication: r/o PE   Labs:  Recent Labs  01/18/15 0445 01/19/15 0436 01/19/15 0634 01/19/15 1500 01/20/15 0350  HGB 10.9* 10.6*  --   --  9.8*  HCT 31.4* 30.6*  --   --  28.2*  PLT 146* 208  --   --  120*  HEPARINUNFRC  --   --  0.21* 0.25* 0.25*  CREATININE 11.19* 13.12*  --   --   --      Assessment: 59yo male remains subtherapeutic on heparin with no change in level despite rate increase.  Goal of Therapy:  Heparin level 0.3-0.7 units/ml   Plan:  Will increase heparin gtt by 10% to 2800 units/hr and check level in 8hr.  Jared [name], PharmD, BCPS  01/20/2015,4:40 AM

## 2015-01-20 NOTE — Progress Notes (Signed)
ANTICOAGULATION CONSULT NOTE - Follow Up Consult  Pharmacy Consult for heparin Indication: possible PE  Allergies  Allergen Reactions  . Morphine And Related Anaphylaxis and Shortness Of Breath  . Montelukast Other (See Comments)    unknown  . Neurontin [Gabapentin] Other (See Comments)    delusional  . Wellbutrin [Bupropion] Palpitations    Insomnia    Patient Measurements: Weight: 288 lb 12.8 oz (131 kg) Heparin Dosing Weight: 111.75 kg  Vital Signs: Temp: 98.5 F (36.9 C) (08/30 1930) Temp Source: Oral (08/30 1930) BP: 139/66 mmHg (08/30 2000) Pulse Rate: 88 (08/30 2000)  Labs:  Recent Labs  01/18/15 0445 01/19/15 0436  01/20/15 0350 01/20/15 0538 01/20/15 1320 01/20/15 2002  HGB 10.9* 10.6*  --  9.8*  --   --   --   HCT 31.4* 30.6*  --  28.2*  --   --   --   PLT 146* 208  --  120*  --   --   --   HEPARINUNFRC  --   --   < > 0.25*  --  0.55 0.55  CREATININE 11.19* 13.12*  --   --  13.86*  --   --   < > = values in this interval not displayed.  Estimated Creatinine Clearance: 8.1 mL/min (by C-G formula based on Cr of 13.86).   Medications:  Scheduled:  . acetaminophen      . antiseptic oral rinse  7 mL Mouth Rinse QID  . aspirin  81 mg Oral Daily  . chlorhexidine gluconate  15 mL Mouth Rinse BID  . furosemide  80 mg Intravenous BID  . insulin aspart  0-9 Units Subcutaneous 6 times per day  . pantoprazole (PROTONIX) IV  40 mg Intravenous QHS  . sodium chloride  10 mL Intravenous Q12H  . [START ON 01/21/2015] vancomycin  1,000 mg Intravenous Q M,W,F-HD   Infusions:  . heparin 2,800 Units/hr (01/20/15 1523)  . TPN (CLINIMIX) Adult without lytes 100 mL/hr at 01/20/15 1806    Assessment: 59 yo male with possible PE is currently on therapeutic heparin.  Heparin level is 0.55.  Goal of Therapy:  Heparin level 0.3-0.7 units/ml Monitor platelets by anticoagulation protocol: Yes   Plan:  - continue heparin at 2800 units/hr - heparin level in am  Jared [name],  Jared [name] 01/20/2015,8:47 PM

## 2015-01-20 NOTE — Progress Notes (Signed)
Pt with ARF requiring intermittent HD at this time, TNA and began clear liquids. I will follow his care to assist with determining if inpt rehab admission can be arranged when pt medically ready once determined to be ESRD or ARF resolving. Discussed with MD and RN CM. LTACH also being considered. [phone]

## 2015-01-20 NOTE — Progress Notes (Signed)
[facility] TEAM 1 - Stepdown/ICU TEAM Progress Note  VIVIN FLY GBM:[id number] DOB: 12/06/1955 DOA: 01/08/2015 PCP: Nadean Corwin, MD  Admit HPI / Brief Narrative: 59 year old WM PMHx Depression, Anxiety, HTN, HLD, OSA,Chronic Back Pain.  Presented 8/18 after being found down at home. Responsive to narcan via EMS. In ED was profoundly hypotensive with elevated WBC, refractory to volume. In ER noted to have lactate 8.5, hyperkalemia and new started on pressors in ED. PCCM to admit.    HPI/Subjective: 8/30 A/O 4, appropriately tender abdomen  Assessment/Plan: Sepsis with presumed abdominal source.>exp lap was neg .  Day 7 vanc completed 01/14/15 Day 10 Zosyn to be stopped 01/17/15  Acute abdominal pain s/p laparotomy -Wound VAC DC'd -Surgical incision covered and clean negative sign of infection  -RN to page attending to bedside in the a.m. during dressing change to do a full evaluation of surgical site -Patient passed swallow eval; nectar thick diet as ordered.  ATN with metab acidosis and hyperkalemia  - renal US neg  - CRRT started per renal 8/20 - stopped 01/15/15  - On Int HD since 01/16/15; scheduled for HD today  -RF and pan autpimmune negative 01/14/15  CKD(baseline creatinine 1.14 from 12/17/14)  OSA -CPAP per respiratory  Anemia/thrombocytopenia -Most likely multifactorial to include acute illness, recent surgery, and poor diet. - HIT panel negative.  Shock liver -Improving? Obtain labs in the a.m.  A- Fib.  -Resolved. Off amio since 01/11/15 -Currently in NSR  Systolic CHF/RV dilation -Daily a.m. Weight -Strict in and out; since admission + 8.9 L -Continue Lasix 80 mg BID per cardiology  Pulmonary hypertension -See systolic CHF  Hyperglycemia  -Hemoglobin A1c pending -Continue sensitive SSI     Code Status: FULL Family Communication: no family present at time of exam Disposition Plan: Per surgery    Consultants: Dr.Matthew Tsuei  ([facility])     Procedure/Significant Events: 8/18 Echo >> EF 45 to 50%, mod RV dilation, mod/severe RV dysfx, PAS 47 mmHg 8/18 Renal u/s >> b/l renal echogenicity  SIGNIFICANT EVENTS: 8/18 Admit, CCS, cardiology consulted; brief PEA arrest; laparotomy with placement of wound vac; started heparin gtt for presumed PE 8/20 CRRT  8/21 OR for abd wound closure  01/12/15; For cath later 01/12/2015 per Dr Jomarie Longs. On pressors. Anuric. On CRRT. Off pressors. Improved Shock Liver. On fent gtt and heparing gtt. 40% fio2 8/23: CCS diagnosis ileus 8/23 cardiac catheterization; left circumflex, second obtuse marginal branch,Ost 2d Mrg-2d Mrg lesion or percent stenosed 8/25 HIT Ab screen normal; TNA being initiated  01/16/15: Bleeding around abd wound - surface bled and IV heparin held yesterday.      Culture Culture - negative HIV negative 01/14/15  Antibiotics: Day 7 vanc completed 01/14/15 Vancomycin 8/31 with HD M/W/F>> Day 10 Zosyn to be stopped 01/17/15  DVT prophylaxis: Heparin drip   Devices    LINES / TUBES:  RIJ HD 8/20 >> LIJ 8/20 >>    Continuous Infusions: . heparin 2,800 Units/hr (01/20/15 1523)  . TPN (CLINIMIX) Adult without lytes 100 mL/hr at 01/20/15 1806    Objective: VITAL SIGNS: Temp: 98.5 F (36.9 C) (08/30 1930) Temp Source: Oral (08/30 1930) BP: 139/66 mmHg (08/30 2000) Pulse Rate: 88 (08/30 2000) SPO2; FIO2:   Intake/Output Summary (Last 24 hours) at 01/20/15 2204 Last data filed at 01/20/15 2000  Gross per 24 hour  Intake   2471 ml  Output   -140 ml  Net   2611 ml  Exam: General: A/O 4, NAD, No acute respiratory distress Eyes: Negative headache, eye pain, double vision,negative scleral hemorrhage ENT: Negative Runny nose, negative ear pain, negative tinnitus, negative gingival bleeding, Neck:  Negative scars, masses, torticollis, lymphadenopathy, JVD Lungs: Clear to auscultation bilaterally without wheezes or crackles Cardiovascular:  Regular rate and rhythm without murmur gallop or rub normal S1 and S2 Abdomen: Midline incision covered and clean, due for change in the a.m., wet-to-dry underneath; serosanguineous fluid, negative sign of infection, pink granulation tissue. Positive abdominal pain (appropriate for recent surgery), negative dysphagia, distended, positive soft, bowel sounds, no rebound, no ascites, no appreciable mass Extremities: No significant cyanosis, clubbing, or edema bilateral lower extremities Psychiatric:  Negative depression, negative anxiety, negative fatigue, negative mania Neurologic:  Cranial nerves II through XII intact, tongue/uvula midline, all extremities muscle strength 5/5, sensation intact throughout, negative dysarthria, negative expressive aphasia, negative receptive aphasia.      Data Reviewed: Basic Metabolic Panel:  Recent Labs Lab 01/16/15 0340 01/16/15 1240 01/17/15 0400 01/18/15 0445 01/19/15 0436 01/20/15 0538  NA 134* 138 137 134* 132* 129*  K 3.7 4.1 4.1 4.6 4.4 4.3  CL 101 104 104 103 99* 97*  CO2 20* 24 22 19* 17* 17*  GLUCOSE 109* 125* 128* 124* 97 107*  BUN 79* 49* 67* 91* 110* 112*  CREATININE 9.20* 6.65* 8.59* 11.19* 13.12* 13.86*  CALCIUM 7.8* 7.8* 7.9* 8.2* 8.4* 8.0*  MG 2.9*  --  2.4 2.4 2.3 2.2  PHOS 5.4*  --  4.1 6.1* 7.0* 7.6*   Liver Function Tests:  Recent Labs Lab 01/15/15 0442 01/16/15 0340 01/17/15 0400 01/18/15 0445 01/19/15 0436 01/20/15 0538  AST 93*  --   --   --  44*  --   ALT 573*  --   --   --  183*  --   ALKPHOS 65  --   --   --  80  --   BILITOT 5.3*  --   --   --  5.2*  --   PROT 4.9*  --   --   --  5.0*  --   ALBUMIN 2.0*  1.9* 1.8* 2.0* 1.8* 1.9* 1.9*   No results for input(s): LIPASE, AMYLASE in the last 168 hours. No results for input(s): AMMONIA in the last 168 hours. CBC:  Recent Labs Lab 01/15/15 0442 01/16/15 0340 01/17/15 0400 01/18/15 0445 01/19/15 0436 01/20/15 0350  WBC 10.2 10.2 10.5 11.5* 12.3* 13.3*    NEUTROABS 8.3* 7.9* 7.8* 9.4* 10.5*  --   HGB 11.7* 10.6* 11.4* 10.9* 10.6* 9.8*  HCT 33.9* 30.1* 33.0* 31.4* 30.6* 28.2*  MCV 88.7 86.7 86.8 89.5 87.7 89.8  PLT 90* 96* 122* 146* 208 120*   Cardiac Enzymes:  Recent Labs Lab 01/15/15 0442  CKTOTAL 717*  CKMB 11.9*  TROPONINI 0.99*   BNP (last 3 results)  Recent Labs  01/08/15 1100  BNP 495.3*    ProBNP (last 3 results) No results for input(s): PROBNP in the last 8760 hours.  CBG:  Recent Labs Lab 01/20/15 0342 01/20/15 0740 01/20/15 1140 01/20/15 1741 01/20/15 1929  GLUCAP 111* 107* 97 100* 104*    No results found for this or any previous visit (from the past 240 hour(s)).   Studies:  Recent x-ray studies have been reviewed in detail by the Attending Physician  Scheduled Meds:  Scheduled Meds: . acetaminophen      . antiseptic oral rinse  7 mL Mouth Rinse QID  . aspirin  81  mg Oral Daily  . chlorhexidine gluconate  15 mL Mouth Rinse BID  . furosemide  80 mg Intravenous BID  . insulin aspart  0-9 Units Subcutaneous 6 times per day  . pantoprazole (PROTONIX) IV  40 mg Intravenous QHS  . sodium chloride  10 mL Intravenous Q12H  . [START ON 01/21/2015] vancomycin  1,000 mg Intravenous Q M,W,F-HD    Time spent on care of this patient: 40 mins   WOODS, Roselind Messier , MD  [facility] Office  [phone] Pager - [phone]  On-Call/Text Page:      [email or web address]      password [email or web address]  If 7PM-7AM, please contact night-coverage [email or web address] Password [email or web address] 01/20/2015, 10:04 PM   LOS: 12 days   Care during the described time interval was provided by me .  I have reviewed this patient's available data, including medical history, events of note, physical examination, and all test results as part of my evaluation. I have personally reviewed and interpreted all radiology studies.   Carolyne Littles, MD [phone] Pager

## 2015-01-20 NOTE — Progress Notes (Signed)
Placed patient on CPAP for the night.  Auto mode 8-20cmH20 via a large full face mask.  Patient is tolerating well at this time.

## 2015-01-20 NOTE — Progress Notes (Signed)
Spoke w wife carmen. She is retired from [employer]. She realizes pt will need rehab. We discussed that rehab options maybe cir vs ltac vs other options as pt progresses. Passed swallow eval today. Will cont to follow and assist as pt progresses.

## 2015-01-20 NOTE — Progress Notes (Signed)
Speech Language Pathology Treatment: Dysphagia  Patient Details Name: Jared [name] MRN: [id number] DOB: 07-04-1955 Today's Date: 01/20/2015 Time: 1440-1450 SLP Time Calculation (min) (ACUTE ONLY): 10 min  Assessment / Plan / Recommendation Clinical Impression  Pt demonstrated ability to safely self feed with assistance, he did not show any signs or symptoms of aspiration during thin liquid trial. SLP recommended a clear thin liquid diet to the pt, pt's wife, and RN. SLP will sign off, pt to upgrade to solid textures when medically ready.   HPI Other Pertinent Information: Pt is a 59 year old male with a PMH of chronic back pain, chest pain, hypertension, hyperlipemia, obesity, obstructive sleep apnea, hypercholesteremia, and depression. Pt was found down at home by wife on 01/08/15. Pt was intubated due to acute respiratory failure. Pt had an exploratory laparotomy, cardiac catheterization, and received dialysis treatment. Pt's primary issue are pulmonary embolism and difficulty with his abdominal wound. Pt's active problems are spetic shock, acute respiratory failure with hypoxia, acute respiratory failure with hypoxemia, cardigenic shock, acute kidney injury, and renal failure. Pt was extubated on 01/18/15 and was unable to be seen by SLP due to medical issues which prohibited therapy, RN reports pt not medically ready. Pt is currently NPO but can have ice chips.    Pertinent Vitals Pain Assessment: Faces Faces Pain Scale: Hurts little more Pain Location: abdomen, pt complained of stomach hurting when drinking Pain Descriptors / Indicators: Grimacing Pain Intervention(s): Monitored during session  SLP Plan  Discharge SLP treatment due to (comment) (diet tolerance)    Recommendations Diet recommendations: Thin liquid (clear thin liquid, no solids) Liquids provided via: Cup;Straw Medication Administration: Crushed with puree Supervision: Staff to assist with self feeding              Oral  Care Recommendations: Oral care QID Plan: Discharge SLP treatment due to (comment) (diet tolerance)    GO    Riccardo Dubin, Student-SLP  Riccardo Dubin 01/20/2015, 3:07 PM

## 2015-01-20 NOTE — Progress Notes (Addendum)
ANTIBIOTIC CONSULT NOTE - INITIAL  Pharmacy Consult for vancomycin Indication: fever in HD pt  Allergies  Allergen Reactions  . Morphine And Related Anaphylaxis and Shortness Of Breath  . Montelukast Other (See Comments)    unknown  . Neurontin [Gabapentin] Other (See Comments)    delusional  . Wellbutrin [Bupropion] Palpitations    Insomnia    Patient Measurements: Weight: 291 lb 0.1 oz (132 kg) Adjusted Body Weight:   Vital Signs: Temp: 100 F (37.8 C) (08/30 1142) Temp Source: Oral (08/30 1142) BP: 141/75 mmHg (08/30 1142) Pulse Rate: 97 (08/30 1142) Intake/Output from previous day: 08/29 0701 - 08/30 0700 In: 1553.7 [I.V.:353.7; TPN:1200] Out: 1437 [Urine:237] Intake/Output from this shift: Total I/O In: 1014 [P.O.:120; I.V.:194; TPN:700] Out: -   Labs:  Recent Labs  01/18/15 0445 01/19/15 0436 01/20/15 0350 01/20/15 0538  WBC 11.5* 12.3* 13.3*  --   HGB 10.9* 10.6* 9.8*  --   PLT 146* 208 120*  --   CREATININE 11.19* 13.12*  --  13.86*    Medical History: Past Medical History  Diagnosis Date  . Chest pain   . HTN (hypertension)   . Hyperlipemia   . DJD (degenerative joint disease)   . BPH (benign prostatic hypertrophy)   . Obesity   . OSA (obstructive sleep apnea)   . Hypercholesteremia   . Chronic back pain   . Anxiety   . Depression     Medications:  Prescriptions prior to admission  Medication Sig Dispense Refill Last Dose  . albuterol (PROVENTIL HFA;VENTOLIN HFA) 108 (90 BASE) MCG/ACT inhaler Inhale 2 puffs into the lungs every 2 (two) hours as needed for wheezing or shortness of breath (cough). 1 Inhaler 2 2 weeks  . ALPRAZolam (XANAX) 1 MG tablet TAKE 1/2-1 TABLET BY MOUTH 3 TIMES DAILY 90 tablet 1 01/07/2015 at Unknown time  . atorvastatin (LIPITOR) 40 MG tablet TAKE 1 TABLET BY MOUTH DAILY OR AS DIRECTED FOR CHOLESTEROL (Patient taking differently: TAKE 1 TABLET BY MOUTH every evening OR AS DIRECTED FOR CHOLESTEROL) 90 tablet 3  01/07/2015 at Unknown time  . bisoprolol-hydrochlorothiazide (ZIAC) 10-6.25 MG per tablet Take 1 tablet by mouth daily.   01/07/2015 at Unknown time  . Cholecalciferol (VITAMIN D-3) 5000 UNITS TABS Take 2,000 Units by mouth daily.    01/07/2015 at Unknown time  . citalopram (CELEXA) 40 MG tablet TAKE 1 TABLET BY MOUTH EVERY DAY FOR MOOD 90 tablet 0 01/07/2015 at Unknown time  . docusate sodium (COLACE) 100 MG capsule Take 100 mg by mouth daily as needed for mild constipation.    01/05/2015 at Unknown time  . Flaxseed, Linseed, (FLAXSEED OIL PO) Take 1 tablet by mouth daily.     01/07/2015 at Unknown time  . fluticasone (FLONASE) 50 MCG/ACT nasal spray USE 1 TO 2 SPRAYS IN EACHNOSTRIL ONCE DAILY (Patient taking differently: USE 1 TO 2 SPRAYS IN EACHNOSTRIL ONCE DAILY as needed) 16 g 99 Past Month at Unknown time  . losartan-hydrochlorothiazide (HYZAAR) 100-25 MG per tablet TAKE 1 TABLET BY MOUTH EVERY DAY 90 tablet 0 01/07/2015 at Unknown time  . minoxidil (LONITEN) 10 MG tablet Take 1 tablet daily or as directed for BP (Patient taking differently: Take 5 mg by mouth at bedtime. ) 90 tablet 99 01/07/2015 at Unknown time  . Omega-3 Fatty Acids (FISH OIL BURP-LESS PO) Take 1 tablet by mouth daily.     01/07/2015 at Unknown time  . oxycodone (ROXICODONE) 30 MG immediate release tablet Take 60 mg  by mouth every 4 (four) hours as needed for pain.    01/07/2015 at Unknown time  . pregabalin (LYRICA) 75 MG capsule Take 1 to 2 capsules 1 hour before sleep 14 capsule 0 01/07/2015 at Unknown time  . senna (SENOKOT) 8.6 MG TABS tablet Take 4 tablets by mouth at bedtime.   01/07/2015 at Unknown time  . azelastine (ASTELIN) 0.1 % nasal spray 1 to 2 sprays each nostril every 12 hours (Patient taking differently: Place 1-2 sprays into both nostrils 2 (two) times daily as needed for allergies. 1 to 2 sprays each nostril every 12 hours) 30 mL 99 Past Week at Unknown time  . ondansetron (ZOFRAN) 8 MG tablet 1/2 to 1 tablet up to 3 x  daily as needed for nausea. 30 tablet 3 01/03/2015  . testosterone cypionate (DEPOTESTOTERONE CYPIONATE) 200 MG/ML injection Inject 2 cc IM every 2 weeks or as directed by a physician. (Patient taking differently: Inject into the muscle every 28 (twenty-eight) days. Inject 2 cc IM every monthly or as directed by a physician.) 10 mL 2 2 weeks  . [DISCONTINUED] pregabalin (LYRICA) 50 MG capsule Take 1 to 3 capsules 1 hour before sleep (Patient not taking: Reported on 01/08/2015) 21 capsule 0 Not Taking at Unknown time   Assessment: Pt recently completed course of vancomycin and zosyn. Abx were previously stopped due to negative cultures and no source identified. Pt is now febrile again.  Will re-initiate vancomycin, renal plans to change HD catheter and re-check blood cx. Slight trend up in WBC. Pt is off schedule with regards to HD, received HD yesterday, this was stopped prematurely due to clotting, and is receiving HD again today.  8/18 BCx x 2>>ngtd 8/18 UCx>> ngtd 8/18 MRSA PCR>> neg  8/18 zosyn >> 8/27 8/18 vancomycin >> 8/24  Goal of Therapy:  Vancomycin pre-HD level 15-25 mcg/ml  Plan:  -Vancomycin 2 g IV x1 then 1 g IV qHD-Mon/Wed/Fri  -Monitor HD tolerance and schedule  -F/u repeat blood cultures  -VR as needed   Agapito Games, PharmD, BCPS Clinical Pharmacist Pager: [phone] 01/20/2015 4:43 PM

## 2015-01-20 NOTE — Progress Notes (Signed)
Patient ID: Jared [name], male   DOB: Jun 14, 1955, 59 y.o.   MRN: [id number] 7 Days Post-Op  Subjective: Pt feels ok this morning.  No nausea. 3BMs documented  Objective: Vital signs in last 24 hours: Temp:  [98 F (36.7 C)-100.6 F (38.1 C)] 99.9 F (37.7 C) (08/30 0345) Pulse Rate:  [93-122] 93 (08/29 1934) Resp:  [16-24] 16 (08/30 0400) BP: (107-164)/(58-90) 147/69 mmHg (08/30 0400) SpO2:  [91 %-97 %] 97 % (08/30 0400) Weight:  [129.3 kg (285 lb 0.9 oz)-132 kg (291 lb 0.1 oz)] 132 kg (291 lb 0.1 oz) (08/30 0404) Last BM Date: 01/19/15  Intake/Output from previous day: 08/29 0701 - 08/30 0700 In: 1553.7 [I.V.:353.7; TPN:1200] Out: 1437 [Urine:237] Intake/Output this shift:    PE: Abd: soft, minimally tender, incision is clean with packing in place, +BS, ND  Lab Results:   Recent Labs  01/19/15 0436 01/20/15 0350  WBC 12.3* 13.3*  HGB 10.6* 9.8*  HCT 30.6* 28.2*  PLT 208 120*   BMET  Recent Labs  01/19/15 0436 01/20/15 0538  NA 132* 129*  K 4.4 4.3  CL 99* 97*  CO2 17* 17*  GLUCOSE 97 107*  BUN 110* 112*  CREATININE 13.12* 13.86*  CALCIUM 8.4* 8.0*   PT/INR No results for input(s): LABPROT, INR in the last 72 hours. CMP     Component Value Date/Time   NA 129* 01/20/2015 0538   K 4.3 01/20/2015 0538   CL 97* 01/20/2015 0538   CO2 17* 01/20/2015 0538   GLUCOSE 107* 01/20/2015 0538   BUN 112* 01/20/2015 0538   CREATININE 13.86* 01/20/2015 0538   CREATININE 1.15 12/17/2014 1707   CALCIUM 8.0* 01/20/2015 0538   PROT 5.0* 01/19/2015 0436   ALBUMIN 1.9* 01/20/2015 0538   AST 44* 01/19/2015 0436   ALT 183* 01/19/2015 0436   ALKPHOS 80 01/19/2015 0436   BILITOT 5.2* 01/19/2015 0436   GFRNONAA 3* 01/20/2015 0538   GFRNONAA 68 12/17/2014 1707   GFRAA 4* 01/20/2015 0538   GFRAA 79 12/17/2014 1707   Lipase     Component Value Date/Time   LIPASE 80* 01/12/2015 0414       Studies/Results: No results found.  Anti-infectives: Anti-infectives     Start     Dose/Rate Route Frequency Ordered Stop   01/15/15 2000  piperacillin-tazobactam (ZOSYN) IVPB 2.25 g  Status:  Discontinued     2.25 g 100 mL/hr over 30 Minutes Intravenous 3 times per day 01/15/15 1511 01/17/15 0958   01/11/15 1630  vancomycin (VANCOCIN) 1,250 mg in sodium chloride 0.9 % 250 mL IVPB  Status:  Discontinued     1,250 mg 166.7 mL/hr over 90 Minutes Intravenous Every 24 hours 01/11/15 1029 01/14/15 1150   01/10/15 1300  vancomycin (VANCOCIN) 1,750 mg in sodium chloride 0.9 % 500 mL IVPB  Status:  Discontinued     1,750 mg 250 mL/hr over 120 Minutes Intravenous Every 48 hours 01/08/15 1327 01/09/15 1537   01/10/15 1300  vancomycin (VANCOCIN) 1,250 mg in sodium chloride 0.9 % 250 mL IVPB  Status:  Discontinued     1,250 mg 166.7 mL/hr over 90 Minutes Intravenous Every 24 hours 01/10/15 0947 01/11/15 1029   01/10/15 1200  piperacillin-tazobactam (ZOSYN) IVPB 2.25 g  Status:  Discontinued     2.25 g 100 mL/hr over 30 Minutes Intravenous 4 times per day 01/10/15 0929 01/15/15 1511   01/09/15 1400  piperacillin-tazobactam (ZOSYN) IVPB 2.25 g  Status:  Discontinued  2.25 g 100 mL/hr over 30 Minutes Intravenous 3 times per day 01/09/15 0908 01/10/15 0929   01/09/15 1100  cefTRIAXone (ROCEPHIN) 1 g in dextrose 5 % 50 mL IVPB  Status:  Discontinued     1 g 100 mL/hr over 30 Minutes Intravenous Every 24 hours 01/08/15 1133 01/08/15 1327   01/09/15 1100  azithromycin (ZITHROMAX) 500 mg in dextrose 5 % 250 mL IVPB  Status:  Discontinued     500 mg 250 mL/hr over 60 Minutes Intravenous Every 24 hours 01/08/15 1133 01/08/15 1327   01/08/15 2200  piperacillin-tazobactam (ZOSYN) IVPB 3.375 g  Status:  Discontinued     3.375 g 12.5 mL/hr over 240 Minutes Intravenous 3 times per day 01/08/15 1327 01/09/15 0908   01/08/15 1400  vancomycin (VANCOCIN) 2,500 mg in sodium chloride 0.9 % 500 mL IVPB     2,500 mg 250 mL/hr over 120 Minutes Intravenous  Once 01/08/15 1327 01/08/15  1747   01/08/15 1330  piperacillin-tazobactam (ZOSYN) IVPB 3.375 g  Status:  Discontinued     3.375 g 100 mL/hr over 30 Minutes Intravenous  Once 01/08/15 1316 01/10/15 0929   01/08/15 1130  cefTRIAXone (ROCEPHIN) 1 g in dextrose 5 % 50 mL IVPB     1 g 100 mL/hr over 30 Minutes Intravenous  Once 01/08/15 1126 01/08/15 1220   01/08/15 1130  azithromycin (ZITHROMAX) 500 mg in dextrose 5 % 250 mL IVPB     500 mg 250 mL/hr over 60 Minutes Intravenous  Once 01/08/15 1126 01/08/15 1322       Assessment/Plan   POD 12, 9 s/p ex lap with open abdominal VAC placement and then closure -begin clear liquids -TNA going, but will be able to wean this as he tolerates oral diet, prealbumin 24.5 -Con't dressing changes BID MMP -per primary service and other specialties DVT prophylaxis -SCDs, heparin held again due to bleeding  LOS: 12 days    Kevyn Boquet E 01/20/2015, 7:38 AM Pager: [phone]

## 2015-01-20 NOTE — Care Management Important Message (Signed)
Important Message  Patient Details  Name: Jared [name] MRN: [id number] Date of Birth: [birth year]   Medicare Important Message Given:  Yes-second notification given    Yvonna Alanis 01/20/2015, 12:09 PM

## 2015-01-20 NOTE — Progress Notes (Addendum)
PARENTERAL NUTRITION CONSULT NOTE - Follow-up  Pharmacy Consult for TPN Indication: Prolonged Ileus  Allergies  Allergen Reactions  . Morphine And Related Anaphylaxis and Shortness Of Breath  . Montelukast Other (See Comments)    unknown  . Neurontin [Gabapentin] Other (See Comments)    delusional  . Wellbutrin [Bupropion] Palpitations    Insomnia    Patient Measurements: Weight: 291 lb 0.1 oz (132 kg) Usual Weight: 138 kg on admission (8/20)  Vital Signs: Temp: 99.4 F (37.4 C) (08/30 0742) Temp Source: Axillary (08/30 0742) BP: 124/59 mmHg (08/30 0742) Pulse Rate: 93 (08/30 0742) Intake/Output from previous day: 08/29 0701 - 08/30 0700 In: 1553.7 [I.V.:353.7; TPN:1200] Out: 1437 [Urine:237] Intake/Output from this shift:    Labs:  Recent Labs  01/18/15 0445 01/19/15 0436 01/20/15 0350  WBC 11.5* 12.3* 13.3*  HGB 10.9* 10.6* 9.8*  HCT 31.4* 30.6* 28.2*  PLT 146* 208 120*     Recent Labs  01/18/15 0445 01/19/15 0436 01/20/15 0538  NA 134* 132* 129*  K 4.6 4.4 4.3  CL 103 99* 97*  CO2 19* 17* 17*  GLUCOSE 124* 97 107*  BUN 91* 110* 112*  CREATININE 11.19* 13.12* 13.86*  CALCIUM 8.2* 8.4* 8.0*  MG 2.4 2.3 2.2  PHOS 6.1* 7.0* 7.6*  PROT  --  5.0*  --   ALBUMIN 1.8* 1.9* 1.9*  AST  --  44*  --   ALT  --  183*  --   ALKPHOS  --  80  --   BILITOT  --  5.2*  --   PREALBUMIN  --  24.5  --   TRIG  --   --  213*   Estimated Creatinine Clearance: 8.1 mL/min (by C-G formula based on Cr of 13.86).    Recent Labs  01/20/15 0010 01/20/15 0342 01/20/15 0740  GLUCAP 104* 111* 107*   Insulin Requirements in the past 24 hours:  0 units SSI last 24h  Current Nutrition:  Clinimix 5/15 at 132ml/hr  Assessment: 59 yo M presents on 8/18 after being found down at home. Was responsive to Narcan and then found to be hypotensive with elevated WBC count in ED. Went to surgery on 8/18 for ex lap but everything looked ok. Being treated for acute resp failure,  RV failure with presumed PE, ATN with metabolic acidosis.   Surgeries/Procedures:  8/18: Ex lap: no ischemic bowel or perforation was found.  8/21 went back to OR for closure of abdominal fascia and placement of wound VAC.  GI: Prolonged Ileus, continuing TPN until ileus resolves. PPI IV. No stool noted. +flatus.  Prealbumin 12.8>24.5. Try clear liquids--no noted intake. +BM.  Endo: CBGs well controlled with minimal SSI (<120)  Lytes: Na 129 down, K 4.3, Phos elevated 7.6 (likely related to renal dysfunction since pt is not receiving any electrolytes in the TPN), Mg 2.2, CoCa 9.68 (phos x Ca=73.5 with goal <55) - (Goal K > 4.0 and Mg > 2.0).   Renal: AKI/CKD2 (baseline Scr 1.14), Was on CRRT but stopped 8/24. Started on iHD 8/26 prn.  UOP 237 ml yesterday (down). Still hoping for renal recovery. Metabolic acidosis Gap 15. HD 8/29 (plan MWF)  Pulm: Intubated 8/18>>8/28 - RA  Cards: RV failure with presumed PE. 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 HIT panel negative. Stopped due to bleeding. CT on hold due to concerns for renal recovery (try VQ scan). Cath=occluded OM. Hgb 10.9>9.8, Plts 208>120 (dramatic drop again).Heparin drip resumed. Some  oozing from wound noted last PM. IV Lasix, ASA81. Heparin level 0.55  Hepatobil: Shock liver. LFTs trending down. Albumin low at 1.9. Tbili 5.2 remains elevated (no jaundice), trigs 323>213 today.  Neuro:chronic back pain, anxiety, depression (Holding xanax, celexa, oxycodone, lyrica)  ID: Sepsis with presumed abdmonial source. Afebrile, WBC 13.3 up 8/18 Blood cx >NEG 8/18 Urine cx >NEG  8/18 Vancomycin >> 8/23 8/18 Zosyn >>8/27  Best Practices: SCDs, heparin IV, MC  TPN Access: 8/20 - CVC triple lumen  TPN start date: 8/25 >>  Nutritional Goals: per RD on 8/29 (increased) KCal: 2400-2600 Protein: 150-175 g Fluid: 2.4-2.6 L  Plan:  - Continue clinimix 5/15 (no electrolytes) at 160ml/hr - provides 120gm protein (80%  goal) and 1704 kcal (71% goal) - will be unable to meet protein requirements d/t limitations of pre-mixed Clinimix bags - Hold IVFE for first 7 days for ICU patients per ASPEN guidelines (Day #5) (TG 213) - Continue MVI and trace elements in TPN (monitor for jaundice) - Continue sensitive SSI  - Clear liquids today--will not increase TPN due to plans to advance diet.    Myalynn Lingle S. Merilynn Finland, PharmD, BCPS Clinical Staff Pharmacist Pager [phone]  Misty Stanley Stillinger 01/20/2015,9:34 AM

## 2015-01-20 NOTE — Progress Notes (Addendum)
ANTICOAGULATION CONSULT NOTE - Follow Up Consult  Pharmacy Consult for Heparin Indication: possible PE  Allergies  Allergen Reactions  . Morphine And Related Anaphylaxis and Shortness Of Breath  . Montelukast Other (See Comments)    unknown  . Neurontin [Gabapentin] Other (See Comments)    delusional  . Wellbutrin [Bupropion] Palpitations    Insomnia    Patient Measurements: Weight: 291 lb 0.1 oz (132 kg) Heparin Dosing Weight: 111.75 kg  Vital Signs: Temp: 100 F (37.8 C) (08/30 1142) Temp Source: Oral (08/30 1142) BP: 141/75 mmHg (08/30 1142) Pulse Rate: 97 (08/30 1142)  Labs:  Recent Labs  01/18/15 0445 01/19/15 0436  01/19/15 1500 01/20/15 0350 01/20/15 0538 01/20/15 1320  HGB 10.9* 10.6*  --   --  9.8*  --   --   HCT 31.4* 30.6*  --   --  28.2*  --   --   PLT 146* 208  --   --  120*  --   --   HEPARINUNFRC  --   --   < > 0.25* 0.25*  --  0.55  CREATININE 11.19* 13.12*  --   --   --  13.86*  --   < > = values in this interval not displayed.  Estimated Creatinine Clearance: 8.1 mL/min (by C-G formula based on Cr of 13.86).  Assessment:  RV failure with presumed PE. 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 HIT panel negative. Stopped due to bleeding. CT on hold due to concerns for renal recovery (try VQ scan). Cath=occluded OM. Hgb 10.9>9.8, Plts 208>120 (dramatic drop again).Heparin drip resumed. Some oozing from wound noted last PM. Heparin level 0.55 in goal.  Goal of Therapy:  Heparin level 0.3-0.7 units/ml Monitor platelets by anticoagulation protocol: Yes   Plan:  Continue IV heparin at 2800 units/hr Reconfirm level in 6 hrs. F/u VQ scan   Gresia Isidoro S. Merilynn Finland, PharmD, BCPS Clinical Staff Pharmacist Pager [phone]  Misty Stanley Stillinger 01/20/2015,2:34 PM

## 2015-01-20 NOTE — Progress Notes (Signed)
Patient ID: Jared [name], male   DOB: 1956/01/08, 59 y.o.   MRN: [id number]  S: transferred out to step down yesterday. Awake, able to answer some questions, denies any complaint.  O:BP 124/59 mmHg  Pulse 93  Temp(Src) 99.4 F (37.4 C) (Axillary)  Resp 16  Wt 291 lb 0.1 oz (132 kg)  SpO2 96%  Intake/Output Summary (Last 24 hours) at 01/20/15 0756 Last data filed at 01/20/15 0500  Gross per 24 hour  Intake 1553.73 ml  Output   1437 ml  Net 116.73 ml   Intake/Output: I/O last 3 completed shifts: In: 3020.7 [I.V.:620.7] Out: 1677 [Urine:477; Other:1200]  Intake/Output this shift:    Weight change: -4 lb 10.1 oz (-2.1 kg) Gen: pleasant male, lying in bed, more interactive today.  Heart: RRR, no m/r/g Resp:bibasilar crackles and wheezes.  Abd:+BS, binder in place, tender to palpation Ext: no edema. Macular diffuse rash on right arm, no pruritic.  Neuro: awake today, more alert than before, oriented to self and place, not date.    Recent Labs Lab 01/14/15 1600 01/15/15 0442 01/16/15 0340 01/16/15 1240 01/17/15 0400 01/18/15 0445 01/19/15 0436 01/20/15 0538  NA 134* 136  137 134* 138 137 134* 132* 129*  K 4.0 3.9  3.9 3.7 4.1 4.1 4.6 4.4 4.3  CL 100* 102  103 101 104 104 103 99* 97*  CO2 21* 21*  22 20* 24 22 19* 17* 17*  GLUCOSE 88 81  79 109* 125* 128* 124* 97 107*  BUN 54* 58*  58* 79* 49* 67* 91* 110* 112*  CREATININE 6.00* 6.51*  6.50* 9.20* 6.65* 8.59* 11.19* 13.12* 13.86*  ALBUMIN 2.0* 2.0*  1.9* 1.8*  --  2.0* 1.8* 1.9* 1.9*  CALCIUM 7.8* 7.9*  8.0* 7.8* 7.8* 7.9* 8.2* 8.4* 8.0*  PHOS 3.8 4.3  4.3 5.4*  --  4.1 6.1* 7.0* 7.6*  AST  --  93*  --   --   --   --  44*  --   ALT  --  573*  --   --   --   --  183*  --    Liver Function Tests:  Recent Labs Lab 01/15/15 0442  01/18/15 0445 01/19/15 0436 01/20/15 0538  AST 93*  --   --  44*  --   ALT 573*  --   --  183*  --   ALKPHOS 65  --   --  80  --   BILITOT 5.3*  --   --  5.2*  --   PROT 4.9*   --   --  5.0*  --   ALBUMIN 2.0*  1.9*  < > 1.8* 1.9* 1.9*  < > = values in this interval not displayed. No results for input(s): LIPASE, AMYLASE in the last 168 hours. No results for input(s): AMMONIA in the last 168 hours. CBC:  Recent Labs Lab 01/16/15 0340 01/17/15 0400 01/18/15 0445 01/19/15 0436 01/20/15 0350  WBC 10.2 10.5 11.5* 12.3* 13.3*  NEUTROABS 7.9* 7.8* 9.4* 10.5*  --   HGB 10.6* 11.4* 10.9* 10.6* 9.8*  HCT 30.1* 33.0* 31.4* 30.6* 28.2*  MCV 86.7 86.8 89.5 87.7 89.8  PLT 96* 122* 146* 208 120*   Cardiac Enzymes:  Recent Labs Lab 01/15/15 0442  CKTOTAL 717*  CKMB 11.9*  TROPONINI 0.99*   CBG:  Recent Labs Lab 01/19/15 0741 01/19/15 1127 01/19/15 2040 01/20/15 0010 01/20/15 0342  GLUCAP 92 108* 120* 104* 111*    Iron Studies:  No results for input(s): IRON, TIBC, TRANSFERRIN, FERRITIN in the last 72 hours. Studies/Results: No results found. Marland Kitchen antiseptic oral rinse  7 mL Mouth Rinse QID  . aspirin  81 mg Oral Daily  . chlorhexidine gluconate  15 mL Mouth Rinse BID  . furosemide  80 mg Intravenous BID  . insulin aspart  0-9 Units Subcutaneous 6 times per day  . pantoprazole (PROTONIX) IV  40 mg Intravenous QHS  . sodium chloride  10 mL Intravenous Q12H    BMET    Component Value Date/Time   NA 129* 01/20/2015 0538   K 4.3 01/20/2015 0538   CL 97* 01/20/2015 0538   CO2 17* 01/20/2015 0538   GLUCOSE 107* 01/20/2015 0538   BUN 112* 01/20/2015 0538   CREATININE 13.86* 01/20/2015 0538   CREATININE 1.15 12/17/2014 1707   CALCIUM 8.0* 01/20/2015 0538   GFRNONAA 3* 01/20/2015 0538   GFRNONAA 68 12/17/2014 1707   GFRAA 4* 01/20/2015 0538   GFRAA 79 12/17/2014 1707   CBC    Component Value Date/Time   WBC 13.3* 01/20/2015 0350   RBC 3.14* 01/20/2015 0350   HGB 9.8* 01/20/2015 0350   HCT 28.2* 01/20/2015 0350   PLT 120* 01/20/2015 0350   MCV 89.8 01/20/2015 0350   MCH 31.2 01/20/2015 0350   MCHC 34.8 01/20/2015 0350   RDW 16.2*  01/20/2015 0350   LYMPHSABS 0.6* 01/19/2015 0436   MONOABS 1.0 01/19/2015 0436   EOSABS 0.2 01/19/2015 0436   BASOSABS 0.0 01/19/2015 0436     Assessment/Plan:  1. AKI/CKD stage 2, oliguric presumably due to septic shock. CVVHD stopped 01/14/15 and had first intermittent HD 01/16/15.  Crt continues to rise. UOP remains low 1. No renal improvement so far, prognosis is guarded. Will need intermittent HD to help with uremia, acid status and volume. Plan for HD MWF while needed and if he can tolerate from hemodynamic standpoint 2. Cont IV lasix for now to help with volume.  2. SIRS- possible PE (RA and RV dilated, RV hypocontractile on ECHO). CT angio on hold for now (concerns for renal recovery as well as IV access).plan is long term anticoagulation without CT angio Currently off of pressors and excubated. 3. CAD- cath revealed occluded OM, cardiology following 4. Shocked liver- improved.  5. S/p exploratory lap and fascia closure now s/p wound vac - with some bleeding, improved after holding anticoag, now back on anticoag.  6. Metabolic acidosis- improved with CVVHD, now worsening again as had no HD since 8/26. HD as above.  7. Hyponatremia - likely hypervolemic.  8. Thrombocytopenia- hit panel neg. Likely 2/2 to shocked liver. Now improved. Back on heparin - no bleeding currently. plt down again today, needs close monitoring.  9. Hypoalbuminemia- per primary svc 10. Vascular access- trialysis catheter RIJ currently - will need tunneled HD cath +/- AVF/AVG if his renal function does not return 11. Nutrition: has TPN, surgery is ok with restarting liquid diet today.   Plan: 1. Crt has not changed, HD yesterday was problematic due to cath clotting. Will retry HD today. May need tunneled Cath.  [place] Doig

## 2015-01-21 LAB — COMPREHENSIVE METABOLIC PANEL
ALK PHOS: 87 U/L (ref 38–126)
ALT: 115 U/L — AB (ref 17–63)
ANION GAP: 17 — AB (ref 5–15)
AST: 30 U/L (ref 15–41)
Albumin: 1.9 g/dL — ABNORMAL LOW (ref 3.5–5.0)
BILIRUBIN TOTAL: 3.1 mg/dL — AB (ref 0.3–1.2)
BUN: 131 mg/dL — ABNORMAL HIGH (ref 6–20)
CALCIUM: 8.1 mg/dL — AB (ref 8.9–10.3)
CO2: 15 mmol/L — AB (ref 22–32)
CREATININE: 15.16 mg/dL — AB (ref 0.61–1.24)
Chloride: 97 mmol/L — ABNORMAL LOW (ref 101–111)
GFR, EST AFRICAN AMERICAN: 3 mL/min — AB (ref >60)
GFR, EST NON AFRICAN AMERICAN: 3 mL/min — AB (ref >60)
Glucose, Bld: 103 mg/dL — ABNORMAL HIGH (ref 65–99)
Potassium: 4.2 mmol/L (ref 3.5–5.1)
SODIUM: 129 mmol/L — AB (ref 135–145)
TOTAL PROTEIN: 4.8 g/dL — AB (ref 6.5–8.1)

## 2015-01-21 LAB — CBC WITH DIFFERENTIAL/PLATELET
BASOS ABS: 0.1 10*3/uL (ref 0.0–0.1)
BASOS PCT: 1 % (ref 0–1)
EOS ABS: 1 10*3/uL — AB (ref 0.0–0.7)
Eosinophils Relative: 8 % — ABNORMAL HIGH (ref 0–5)
HCT: 26.9 % — ABNORMAL LOW (ref 39.0–52.0)
Hemoglobin: 9.6 g/dL — ABNORMAL LOW (ref 13.0–17.0)
LYMPHS PCT: 8 % — AB (ref 12–46)
Lymphs Abs: 1 10*3/uL (ref 0.7–4.0)
MCH: 31.2 pg (ref 26.0–34.0)
MCHC: 35.7 g/dL (ref 30.0–36.0)
MCV: 87.3 fL (ref 78.0–100.0)
MONO ABS: 1.1 10*3/uL — AB (ref 0.1–1.0)
Monocytes Relative: 9 % (ref 3–12)
NEUTROS ABS: 9.1 10*3/uL — AB (ref 1.7–7.7)
NEUTROS PCT: 74 % (ref 43–77)
PLATELETS: 145 10*3/uL — AB (ref 150–400)
RBC: 3.08 MIL/uL — ABNORMAL LOW (ref 4.22–5.81)
RDW: 15.8 % — ABNORMAL HIGH (ref 11.5–15.5)
WBC: 12.3 10*3/uL — ABNORMAL HIGH (ref 4.0–10.5)

## 2015-01-21 LAB — GLUCOSE, CAPILLARY
GLUCOSE-CAPILLARY: 105 mg/dL — AB (ref 65–99)
GLUCOSE-CAPILLARY: 96 mg/dL (ref 65–99)
Glucose-Capillary: 94 mg/dL (ref 65–99)
Glucose-Capillary: 98 mg/dL (ref 65–99)
Glucose-Capillary: 99 mg/dL (ref 65–99)
Glucose-Capillary: 106 mg/dL — ABNORMAL HIGH (ref 65–99)

## 2015-01-21 LAB — HEPARIN LEVEL (UNFRACTIONATED)
Heparin Unfractionated: 0.74 IU/mL — ABNORMAL HIGH (ref 0.30–0.70)
Heparin Unfractionated: 0.46 IU/mL (ref 0.30–0.70)
Heparin Unfractionated: >2.2 IU/mL — ABNORMAL HIGH (ref 0.30–0.70)

## 2015-01-21 LAB — MAGNESIUM: Magnesium: 2.2 mg/dL (ref 1.7–2.4)

## 2015-01-21 MED ORDER — HEPARIN SOD (PORK) LOCK FLUSH 10 UNIT/ML IV SOLN
20.0000 [IU] | INTRAVENOUS | Status: DC | PRN
  Administered 2015-01-21: 20 [IU]
  Filled 2015-01-21: qty 2

## 2015-01-21 MED ORDER — HEPARIN SOD (PORK) LOCK FLUSH 10 UNIT/ML IV SOLN
20.0000 [IU] | Freq: Two times a day (BID) | INTRAVENOUS | Status: DC
  Administered 2015-01-23: 20 [IU]
  Filled 2015-01-21 (×16): qty 2

## 2015-01-21 MED ORDER — INSULIN ASPART 100 UNIT/ML ~~LOC~~ SOLN: 0.0000 [IU] | Freq: Three times a day (TID) | SUBCUTANEOUS | Status: DC

## 2015-01-21 MED ORDER — M.V.I. ADULT IV INJ
INJECTION | INTRAVENOUS | Status: AC
  Administered 2015-01-21: 18:00:00 via INTRAVENOUS
  Filled 2015-01-21: qty 2400

## 2015-01-21 NOTE — Progress Notes (Signed)
ANTICOAGULATION CONSULT NOTE - Follow Up Consult  Pharmacy Consult for heparin Indication: pulmonary embolus  Labs:  Recent Labs  01/19/15 0436  01/20/15 0350 01/20/15 0538  01/21/15 0312 01/21/15 1500 01/21/15 2324  HGB 10.6*  --  9.8*  --   --  9.6*  --   --   HCT 30.6*  --  28.2*  --   --  26.9*  --   --   PLT 208  --  120*  --   --  145*  --   --   HEPARINUNFRC  --   < > 0.25*  --   < > 0.74* 0.46 >2.20*  CREATININE 13.12*  --   --  13.86*  --  15.16*  --   --   < > = values in this interval not displayed.   Assessment/Plan:  59yo male now supratherapeutic on heparin after one level at goal though pt is currently in HD and suspect this result is in error. Will continue gtt at current rate and confirm stable with am labs.   Vernard Gambles, PharmD, BCPS  01/21/2015,11:55 PM

## 2015-01-21 NOTE — Progress Notes (Signed)
ANTICOAGULATION CONSULT NOTE - Follow Up Consult  Pharmacy Consult for Heparin Indication: pulmonary embolus  Allergies  Allergen Reactions  . Morphine And Related Anaphylaxis and Shortness Of Breath  . Montelukast Other (See Comments)    unknown  . Neurontin [Gabapentin] Other (See Comments)    delusional  . Wellbutrin [Bupropion] Palpitations    Insomnia   Ideal body weight: 83.351 kg (183 lb 12.1 oz) Adjusted ideal body weight: 103.451 kg (228 lb 1.1 oz)   Patient Measurements: Weight: 294 lb 8.6 oz (133.6 kg)  Ideal body weight: 83.4 kg Heparin Dosing Weight: 113 kg  Vital Signs: Temp: 99.8 F (37.7 C) (08/31 1331) Temp Source: Oral (08/31 1331) BP: 138/59 mmHg (08/31 1200) Pulse Rate: 90 (08/31 1200)  Labs:  Recent Labs  01/19/15 0436  01/20/15 0350 01/20/15 0538  01/20/15 2002 01/21/15 0312 01/21/15 1500  HGB 10.6*  --  9.8*  --   --   --  9.6*  --   HCT 30.6*  --  28.2*  --   --   --  26.9*  --   PLT 208  --  120*  --   --   --  145*  --   HEPARINUNFRC  --   < > 0.25*  --   < > 0.55 0.74* 0.46  CREATININE 13.12*  --   --  13.86*  --   --  15.16*  --   < > = values in this interval not displayed.  Estimated Creatinine Clearance: 7.5 mL/min (by C-G formula based on Cr of 15.16).    Assessment: 59yo male continues on heparin drip for PE.  Pt experienced some bleeding/oozing from incision on abdomen on 8/28 >> heparin temporarily held >> now resolved.  Heparin level now therapeutic at 0.46 after rate decrease this morning.  Per RN, heparin still infusing w/o problem, no bleeding, no issues s/p HD cath insertion this afternoon.  Goal of Therapy:  Heparin level 0.3-0.7 units/ml Monitor platelets by anticoagulation protocol: Yes   Plan:  -Continue heparin infusion at 2600 units/hr -Repeat 8hr HL -Monitor daily HL, CBC, s/sx of bleeding  Waynette Buttery, PharmD Clinical Pharmacist Pager: [phone] 01/21/2015 4:25 PM

## 2015-01-21 NOTE — Progress Notes (Addendum)
PARENTERAL NUTRITION CONSULT NOTE - Follow-up  Pharmacy Consult for TPN Indication: Prolonged Ileus  Allergies  Allergen Reactions  . Morphine And Related Anaphylaxis and Shortness Of Breath  . Montelukast Other (See Comments)    unknown  . Neurontin [Gabapentin] Other (See Comments)    delusional  . Wellbutrin [Bupropion] Palpitations    Insomnia    Patient Measurements: Weight: 294 lb 8.6 oz (133.6 kg) Usual Weight: 138 kg on admission (8/20)  Vital Signs: Temp: 98.5 F (36.9 C) (08/31 0747) Temp Source: Oral (08/31 0747) BP: 146/61 mmHg (08/31 0747) Pulse Rate: 90 (08/31 0747) Intake/Output from previous day: 08/30 0701 - 08/31 0700 In: 3244.7 [P.O.:180; I.V.:664.7; TPN:2400] Out: -230 [Urine:210] Intake/Output from this shift:    Labs:  Recent Labs  01/19/15 0436 01/20/15 0350 01/21/15 0312  WBC 12.3* 13.3* 12.3*  HGB 10.6* 9.8* 9.6*  HCT 30.6* 28.2* 26.9*  PLT 208 120* 145*     Recent Labs  01/19/15 0436 01/20/15 0538 01/21/15 0312  NA 132* 129* 129*  K 4.4 4.3 4.2  CL 99* 97* 97*  CO2 17* 17* 15*  GLUCOSE 97 107* 103*  BUN 110* 112* 131*  CREATININE 13.12* 13.86* 15.16*  CALCIUM 8.4* 8.0* 8.1*  MG 2.3 2.2 2.2  PHOS 7.0* 7.6*  --   PROT 5.0*  --  4.8*  ALBUMIN 1.9* 1.9* 1.9*  AST 44*  --  30  ALT 183*  --  115*  ALKPHOS 80  --  87  BILITOT 5.2*  --  3.1*  PREALBUMIN 24.5  --   --   TRIG  --  213*  --    Estimated Creatinine Clearance: 7.5 mL/min (by C-G formula based on Cr of 15.16).    Recent Labs  01/20/15 1929 01/20/15 2304 01/21/15 0347  GLUCAP 104* 105* 96   Insulin Requirements in the past 24 hours:  0 units SSI last 24h  Current Nutrition:  Clinimix 5/15 at 133ml/hr  Assessment: 59 yo M presents on 8/18 after being found down at home. Was responsive to Narcan and then found to be hypotensive with elevated WBC count in ED. Went to surgery on 8/18 for ex lap but everything looked ok. Being treated for acute resp  failure, RV failure with presumed PE, ATN with metabolic acidosis.   Surgeries/Procedures:  8/18: Ex lap: no ischemic bowel or perforation was found.  8/21 went back to OR for closure of abdominal fascia and placement of wound VAC.  GI: Prolonged Ileus, continuing TPN until ileus resolves. PPI IV. +flatus Prealbumin 12.8>24.5. Try clear liquids--no noted intake. +BM 8/30.  --8/31: Nauseated, States he didn't tolerate his liquids well yesterday  Endo: CBGs well controlled with minimal SSI (<107)  Lytes: Na 129 remains low, K 4.2, Phos elevated 7.6 (likely related to renal dysfunction since pt is not receiving any electrolytes in the TPN), Mg 2.2, CoCa 9.78 (phos x Ca=74 with goal <55) - (Goal K > 4.0 and Mg > 2.0).   Renal: AKI/CKD2 (baseline Scr 1.14), Was on CRRT but stopped 8/24. Started on iHD 8/26 prn. UOP 210 ml yesterday (down). Metabolic acidosis Gap 17. Bicarb low 15. Some HD 8/29 (catheter clotting), HD 8/30 also inadequate. Plan to replace temp HD catheter 8/31 for HD.  Pulm: Intubated 8/18>>8/28 - now RA  Cards: RV failure with presumed PE. 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 HIT panel negative. Stopped due to bleeding. CT on hold due to concerns for renal function (try  VQ scan). Cath=occluded OM. Hgb 10.9>9.6, Plts 208>120>145 (dramatic drop again).Heparin drip resumed. Some oozing from wound noted 8/29 PM. IV Lasix, ASA81. Heparin level 0.74 slightly high.  Hepatobil: Shock liver. LFTs trending down. Albumin low at 1.9. Tbili 5.2>3.1 remains elevated (no jaundice), trigs 323>213.  Neuro:chronic back pain, anxiety, depression (Holding xanax, celexa, oxycodone, lyrica)  ID: Sepsis with presumed abdmonial source. Afebrile, WBC 12.3. 8/30 MD noted generalized erythematous papules. Looksl ike drug rash? Mild temp after HD 8/30--started Vanco and removed HD catheter. F/u HD schedule now.  8/30 BC x 2>> 8/18 Blood cx >NEG 8/18 Urine cx >NEG  8/18 Vancomycin  >> 8/23, 8/30>> 8/18 Zosyn >>8/27  Best Practices: SCDs, heparin IV, MC  TPN Access: 8/20 - CVC triple lumen  TPN start date: 8/25 >>  Nutritional Goals: per RD on 8/29 (increased) KCal: 2400-2600 Protein: 150-175 g Fluid: 2.4-2.6 L  Plan:  - Continue clinimix 5/15 (no electrolytes) at 164ml/hr - provides 120gm protein (80% goal) and 1704 kcal (71% goal) - will be unable to meet protein requirements d/t limitations of pre-mixed Clinimix bags/fluid - Hold IVFE for first 7 days for ICU patients per ASPEN guidelines (Day #6) (TG 213) - Continue MVI and trace elements in TPN (monitor for jaundice) - Continue Clear liquids today--will not increase TPN due to hopes to advance diet. - Decrease CBGs to q8h and SSI to TID   Jared Sefcik S. Jared [name], PharmD, BCPS Clinical Staff Pharmacist Pager [phone]  Misty Stanley Stillinger 01/21/2015,8:27 AM

## 2015-01-21 NOTE — Progress Notes (Signed)
PT Cancellation Note  Patient Details Name: Jared [name] MRN: [id number] DOB: 24-May-1955   Cancelled Treatment:    Reason Eval/Treat Not Completed: Medical issues which prohibited therapy (Pt just got HD line place and IV team on way to start heparin.  Nursing asked PT to HOLD today.)   Berline Lopes 01/21/2015, 1:25 PM  [facility] [phone] (pager)

## 2015-01-21 NOTE — Progress Notes (Signed)
Patient ID: Jared [name], male   DOB: 1956-01-27, 59 y.o.   MRN: [id number]  S:  Unable to have adequate HD for 2nd day in row yesterday; temp HD cath not functional Low grade fever with diffuse erythematous rash during that time Temp cath pulled Started on Vanc BUN still quite elevated Defervesced No evidence of Inc UOP so far  O:BP 146/61 mmHg  Pulse 90  Temp(Src) 98.5 F (36.9 C) (Oral)  Resp 15  Wt 133.6 kg (294 lb 8.6 oz)  SpO2 97%  Intake/Output Summary (Last 24 hours) at 01/21/15 1122 Last data filed at 01/21/15 1058  Gross per 24 hour  Intake 2368.67 ml  Output   -230 ml  Net 2598.67 ml   Intake/Output: I/O last 3 completed shifts: In: 4534.7 [P.O.:180; I.V.:954.7] Out: -30 [Urine:410]  Intake/Output this shift:  Total I/O In: 10 [I.V.:10] Out: -  Weight change: 0.4 kg (14.1 oz) Gen: pleasant male, lying in bed, Heart: RRR, no m/r/g Resp:bibasilar crackles and wheezes.  Abd:+BS, binder in place, tender to palpation Ext: no edema. Macular diffuse rash on right arm, no pruritic.  Neuro: awake today,   Recent Labs Lab 01/14/15 1600 01/15/15 0442 01/16/15 0340 01/16/15 1240 01/17/15 0400 01/18/15 0445 01/19/15 0436 01/20/15 0538 01/21/15 0312  NA 134* 136  137 134* 138 137 134* 132* 129* 129*  K 4.0 3.9  3.9 3.7 4.1 4.1 4.6 4.4 4.3 4.2  CL 100* 102  103 101 104 104 103 99* 97* 97*  CO2 21* 21*  22 20* 24 22 19* 17* 17* 15*  GLUCOSE 88 81  79 109* 125* 128* 124* 97 107* 103*  BUN 54* 58*  58* 79* 49* 67* 91* 110* 112* 131*  CREATININE 6.00* 6.51*  6.50* 9.20* 6.65* 8.59* 11.19* 13.12* 13.86* 15.16*  ALBUMIN 2.0* 2.0*  1.9* 1.8*  --  2.0* 1.8* 1.9* 1.9* 1.9*  CALCIUM 7.8* 7.9*  8.0* 7.8* 7.8* 7.9* 8.2* 8.4* 8.0* 8.1*  PHOS 3.8 4.3  4.3 5.4*  --  4.1 6.1* 7.0* 7.6*  --   AST  --  93*  --   --   --   --  44*  --  30  ALT  --  573*  --   --   --   --  183*  --  115*   Liver Function Tests:  Recent Labs Lab 01/15/15 0442  01/19/15 0436  01/20/15 0538 01/21/15 0312  AST 93*  --  44*  --  30  ALT 573*  --  183*  --  115*  ALKPHOS 65  --  80  --  87  BILITOT 5.3*  --  5.2*  --  3.1*  PROT 4.9*  --  5.0*  --  4.8*  ALBUMIN 2.0*  1.9*  < > 1.9* 1.9* 1.9*  < > = values in this interval not displayed. No results for input(s): LIPASE, AMYLASE in the last 168 hours. No results for input(s): AMMONIA in the last 168 hours. CBC:  Recent Labs Lab 01/17/15 0400 01/18/15 0445 01/19/15 0436 01/20/15 0350 01/21/15 0312  WBC 10.5 11.5* 12.3* 13.3* 12.3*  NEUTROABS 7.8* 9.4* 10.5*  --  9.1*  HGB 11.4* 10.9* 10.6* 9.8* 9.6*  HCT 33.0* 31.4* 30.6* 28.2* 26.9*  MCV 86.8 89.5 87.7 89.8 87.3  PLT 122* 146* 208 120* 145*   Cardiac Enzymes:  Recent Labs Lab 01/15/15 0442  CKTOTAL 717*  CKMB 11.9*  TROPONINI 0.99*   CBG:  Recent  Labs Lab 01/20/15 1741 01/20/15 1929 01/20/15 2304 01/21/15 0347 01/21/15 0745  GLUCAP 100* 104* 105* 96 94    Iron Studies: No results for input(s): IRON, TIBC, TRANSFERRIN, FERRITIN in the last 72 hours. Studies/Results: No results found. Marland Kitchen antiseptic oral rinse  7 mL Mouth Rinse QID  . aspirin  81 mg Oral Daily  . chlorhexidine gluconate  15 mL Mouth Rinse BID  . furosemide  80 mg Intravenous BID  . insulin aspart  0-9 Units Subcutaneous 3 times per day  . pantoprazole (PROTONIX) IV  40 mg Intravenous QHS  . sodium chloride  10 mL Intravenous Q12H  . vancomycin  1,000 mg Intravenous Q M,W,F-HD    BMET    Component Value Date/Time   NA 129* 01/21/2015 0312   K 4.2 01/21/2015 0312   CL 97* 01/21/2015 0312   CO2 15* 01/21/2015 0312   GLUCOSE 103* 01/21/2015 0312   BUN 131* 01/21/2015 0312   CREATININE 15.16* 01/21/2015 0312   CREATININE 1.15 12/17/2014 1707   CALCIUM 8.1* 01/21/2015 0312   GFRNONAA 3* 01/21/2015 0312   GFRNONAA 68 12/17/2014 1707   GFRAA 3* 01/21/2015 0312   GFRAA 79 12/17/2014 1707   CBC    Component Value Date/Time   WBC 12.3* 01/21/2015 0312   RBC  3.08* 01/21/2015 0312   HGB 9.6* 01/21/2015 0312   HCT 26.9* 01/21/2015 0312   PLT 145* 01/21/2015 0312   MCV 87.3 01/21/2015 0312   MCH 31.2 01/21/2015 0312   MCHC 35.7 01/21/2015 0312   RDW 15.8* 01/21/2015 0312   LYMPHSABS 1.0 01/21/2015 0312   MONOABS 1.1* 01/21/2015 0312   EOSABS 1.0* 01/21/2015 0312   BASOSABS 0.1 01/21/2015 0312     Assessment/Plan:  1. AoCKD, oliguric presumably due to septic shock related ATN 1. CVVHD stopped 01/14/15 and had first intermittent HD 01/16/15.  C 2. No evidenc of recovered GFR, remains iHD dependent 3. Needs HD today 4. Will replace temp HD cath, appreciate PCCM help 5. Cont IV lasix for now to help with volume. 2. SIRS- possible PE (RA and RV dilated, RV hypocontractile on ECHO). CT angio on hold for now (concerns for renal recovery as well as IV access).plan is long term anticoagulation without CT angio Currently off of pressors and excubated. 3. CAD- cath revealed occluded OM, cardiology following 4. Shock liver- improved.  5. S/p exploratory lap and fascia closure now s/p wound vac - with some bleeding, improved after holding anticoag, now back on anticoag.  6. Metabolic acidosis 7. Hyponatremia.  8. Thrombocytopenia- hit panel neg. Likely 2/2 to shocked liver. Now improved. Back on heparin - no bleeding currently. plt down again today, needs close monitoring.  9. Hypoalbuminemia- per primary svc 10. Vascular access- trialysis catheter RIJ currently - will need tunneled HD cath +/- AVF/AVG if his renal function does not return 11. Nutrition: has TPN, surgery is ok with restarting liquid diet today.  12. Fever 8/30: temp HD cath removed.  Started Vanc.  B Cx x2 pending   Ludwin Flahive B

## 2015-01-21 NOTE — Procedures (Signed)
Central Venous HD Catheter Insertion Procedure Note Jared [name] [id number] [birth year]  Procedure: Insertion of Central Venous Catheter Indications: Dialysis  Procedure Details Consent: Risks of procedure as well as the alternatives and risks of each were explained to the (patient/caregiver).  Consent for procedure obtained. Time Out: Verified patient identification, verified procedure, site/side was marked, verified correct patient position, special equipment/implants available, medications/allergies/relevent history reviewed, required imaging and test results available.  Performed  Maximum sterile technique was used including antiseptics, cap, gloves, gown, hand hygiene, mask and sheet. Skin prep: Chlorhexidine; local anesthetic administered A antimicrobial bonded/coated triple lumen catheter was placed in the right femoral vein due to patient being a dialysis patient using the Seldinger technique.  Evaluation Blood flow good Complications: No apparent complications Patient did tolerate procedure well.  Jared [name],Jared [name] 01/21/2015, 12:02 PM

## 2015-01-21 NOTE — Progress Notes (Signed)
[place] TEAM 1 - Stepdown/ICU TEAM Progress Note  Jared [name] UDJ:[id number] DOB: Oct 10, 1955 DOA: 01/08/2015 PCP: Nadean Corwin, MD  Admit HPI / Brief Narrative: 59 year old M Hx Depression, Anxiety, HTN, HLD, OSA, and Chronic Back Pain who presented 8/18 after being found down at home. Responsive to narcan via EMS. In ED was profoundly hypotensive with elevated WBC, refractory to volume. In ER noted to have lactate 8.5, hyperkalemia and started on pressors. PCCM admitted the pt.    Significant Events: 8/18 Admit - CCS, Cardiology consulted; brief PEA arrest; laparotomy with placement of wound vac; started heparin gtt for presumed PE 8/20 CRRT  8/21 OR for abd wound closure  8/22 For cath later 01/12/2015 per Dr Jomarie Longs. On pressors. Anuric. On CRRT. Off pressors. Improved Shock Liver. On fent gtt and heparing gtt. 40% fio2 8/23 CCS diagnosis ileus 8/23 Cardiac catheterization left circumflex, second obtuse marginal branch,Ost 2d Mrg-2d Mrg lesion or percent stenosed 8/25 HIT Ab screen normal - TNA initiated  8/26 Bleeding around abd wound - surface bled and IV heparin held   HPI/Subjective: The pt is alert but confused.  He is not able to provide a history.  He "just wants to get the hell outa this damn place."     Assessment/Plan:  Sepsis with presumed abdominal source.>exp lap was neg .  Vanc stopped 8/23 - Zosyn stopped 8/26 - temp up to 101 yesterday - Vanc resumed - HD catheter removed - to have new temp cath placed today   Acute abdominal pain s/p laparotomy -care per Gen Surgery  -Patient passed swallow eval; nectar thick diet as ordered.  ATN with metab acidosis and hyperkalemia in CKD  - renal US neg  - CRRT started per renal 8/20 - stopped 01/15/15  - On HD since 01/16/15 - RF and pan autoimmune negative 01/14/15 - Nephrology following - will need eventual tunneled HD cath once infection risk/status more clear  - baseline creatinine 1.14 from  12/17/14  OSA -CPAP per respiratory  Anemia/thrombocytopenia - Most likely multifactorial to include acute illness, recent surgery, and poor diet - HIT panel negative.  Shock liver -LFTs slowly improving   A- Fib -Currently in NSR - off amio since 01/11/15  Systolic CHF/RV dilation -volume management per HD   Pulmonary hypertension  Hyperglycemia  -insulin while on TNA as needed   Code Status: FULL Family Communication: spoke w/ wife at bedside  Disposition Plan: SDU  Consultants: PCCM Gen Surgery   Procedure/Significant Events: 8/18 Echo >> EF 45 to 50%, mod RV dilation, mod/severe RV dysfx, PAS 47 mmHg 8/18 Renal u/s >> b/l renal echogenicity  DVT prophylaxis: Heparin drip  Objective: Blood pressure 138/59, pulse 90, temperature 99.8 F (37.7 C), temperature source Oral, resp. rate 16, weight 133.6 kg (294 lb 8.6 oz), SpO2 100 %.  Intake/Output Summary (Last 24 hours) at 01/21/15 1345 Last data filed at 01/21/15 1058  Gross per 24 hour  Intake 2240.67 ml  Output   -230 ml  Net 2470.67 ml   Exam: General: No acute respiratory distress - alert but delirious Lungs: Clear to auscultation bilaterally without wheezes or crackles Cardiovascular: Regular rate and rhythm without murmur gallop or rub normal S1 and S2 Abdomen: Large midline wound dressed and presently trying, no appreciable bowel sounds, soft Extremities: No significant cyanosis or clubbing  Data Reviewed: Basic Metabolic Panel:  Recent Labs Lab 01/16/15 0340  01/17/15 0400 01/18/15 0445 01/19/15 0436 01/20/15 0538 01/21/15 0312  NA 134*  < >  137 134* 132* 129* 129*  K 3.7  < > 4.1 4.6 4.4 4.3 4.2  CL 101  < > 104 103 99* 97* 97*  CO2 20*  < > 22 19* 17* 17* 15*  GLUCOSE 109*  < > 128* 124* 97 107* 103*  BUN 79*  < > 67* 91* 110* 112* 131*  CREATININE 9.20*  < > 8.59* 11.19* 13.12* 13.86* 15.16*  CALCIUM 7.8*  < > 7.9* 8.2* 8.4* 8.0* 8.1*  MG 2.9*  --  2.4 2.4 2.3 2.2 2.2  PHOS 5.4*   --  4.1 6.1* 7.0* 7.6*  --   < > = values in this interval not displayed.   Liver Function Tests:  Recent Labs Lab 01/15/15 0442  01/17/15 0400 01/18/15 0445 01/19/15 0436 01/20/15 0538 01/21/15 0312  AST 93*  --   --   --  44*  --  30  ALT 573*  --   --   --  183*  --  115*  ALKPHOS 65  --   --   --  80  --  87  BILITOT 5.3*  --   --   --  5.2*  --  3.1*  PROT 4.9*  --   --   --  5.0*  --  4.8*  ALBUMIN 2.0*  1.9*  < > 2.0* 1.8* 1.9* 1.9* 1.9*  < > = values in this interval not displayed.  CBC:  Recent Labs Lab 01/16/15 0340 01/17/15 0400 01/18/15 0445 01/19/15 0436 01/20/15 0350 01/21/15 0312  WBC 10.2 10.5 11.5* 12.3* 13.3* 12.3*  NEUTROABS 7.9* 7.8* 9.4* 10.5*  --  9.1*  HGB 10.6* 11.4* 10.9* 10.6* 9.8* 9.6*  HCT 30.1* 33.0* 31.4* 30.6* 28.2* 26.9*  MCV 86.7 86.8 89.5 87.7 89.8 87.3  PLT 96* 122* 146* 208 120* 145*   Cardiac Enzymes:  Recent Labs Lab 01/15/15 0442  CKTOTAL 717*  CKMB 11.9*  TROPONINI 0.99*   CBG:  Recent Labs Lab 01/20/15 1929 01/20/15 2304 01/21/15 0347 01/21/15 0745 01/21/15 1329  GLUCAP 104* 105* 96 94 98    Recent Results (from the past 240 hour(s))  Culture, blood (routine x 2)     Status: None (Preliminary result)   Collection Time: 01/20/15  3:48 PM  Result Value Ref Range Status   Specimen Description BLOOD PORTA CATH  Final   Special Requests BOTTLES DRAWN AEROBIC AND ANAEROBIC  Final   Culture NO GROWTH < 24 HOURS  Final   Report Status PENDING  Incomplete  Culture, blood (routine x 2)     Status: None (Preliminary result)   Collection Time: 01/20/15  4:25 PM  Result Value Ref Range Status   Specimen Description BLOOD PORTA CATH  Final   Special Requests BOTTLES DRAWN AEROBIC AND ANAEROBIC  Final   Culture NO GROWTH < 24 HOURS  Final   Report Status PENDING  Incomplete     Studies:  Recent x-ray studies have been reviewed in detail by the Attending Physician  Scheduled Meds:  Scheduled Meds: .  antiseptic oral rinse  7 mL Mouth Rinse QID  . aspirin  81 mg Oral Daily  . chlorhexidine gluconate  15 mL Mouth Rinse BID  . furosemide  80 mg Intravenous BID  . insulin aspart  0-9 Units Subcutaneous 3 times per day  . pantoprazole (PROTONIX) IV  40 mg Intravenous QHS  . sodium chloride  10 mL Intravenous Q12H  . vancomycin  1,000 mg Intravenous Q  M,W,F-HD    Time spent on care of this patient: 25 mins  Lonia Blood, MD [facility] For Consults/Admissions - Flow Manager - [phone] Office  [phone]  Contact MD directly via text page:      [email or web address]      password [facility]  01/21/2015, 1:45 PM   LOS: 13 days

## 2015-01-21 NOTE — Progress Notes (Signed)
Patient ID: Jared [name], male   DOB: 09/20/1955, 59 y.o.   MRN: [id number] 8 Days Post-Op  Subjective: Pt feels a little nauseated this morning.  States he didn't tolerate his liquids well yesterday.  Still having BMs  Objective: Vital signs in last 24 hours: Temp:  [97.9 F (36.6 C)-101 F (38.3 C)] 98.9 F (37.2 C) (08/31 0349) Pulse Rate:  [83-97] 92 (08/31 0600) Resp:  [14-24] 22 (08/31 0600) BP: (104-155)/(46-75) 147/71 mmHg (08/31 0600) SpO2:  [96 %-100 %] 99 % (08/31 0600) FiO2 (%):  [21 %] 21 % (08/30 2244) Weight:  [131 kg (288 lb 12.8 oz)-133.6 kg (294 lb 8.6 oz)] 133.6 kg (294 lb 8.6 oz) (08/31 0500) Last BM Date: 01/20/15  Intake/Output from previous day: 08/30 0701 - 08/31 0700 In: 3118.7 [P.O.:180; I.V.:638.7; TPN:2300] Out: -230 [Urine:210] Intake/Output this shift:    PE: Abd: soft, wound is stable and packed, some BS, appropriately tender  Lab Results:   Recent Labs  01/20/15 0350 01/21/15 0312  WBC 13.3* 12.3*  HGB 9.8* 9.6*  HCT 28.2* 26.9*  PLT 120* 145*   BMET  Recent Labs  01/20/15 0538 01/21/15 0312  NA 129* 129*  K 4.3 4.2  CL 97* 97*  CO2 17* 15*  GLUCOSE 107* 103*  BUN 112* 131*  CREATININE 13.86* 15.16*  CALCIUM 8.0* 8.1*   PT/INR No results for input(s): LABPROT, INR in the last 72 hours. CMP     Component Value Date/Time   NA 129* 01/21/2015 0312   K 4.2 01/21/2015 0312   CL 97* 01/21/2015 0312   CO2 15* 01/21/2015 0312   GLUCOSE 103* 01/21/2015 0312   BUN 131* 01/21/2015 0312   CREATININE 15.16* 01/21/2015 0312   CREATININE 1.15 12/17/2014 1707   CALCIUM 8.1* 01/21/2015 0312   PROT 4.8* 01/21/2015 0312   ALBUMIN 1.9* 01/21/2015 0312   AST 30 01/21/2015 0312   ALT 115* 01/21/2015 0312   ALKPHOS 87 01/21/2015 0312   BILITOT 3.1* 01/21/2015 0312   GFRNONAA 3* 01/21/2015 0312   GFRNONAA 68 12/17/2014 1707   GFRAA 3* 01/21/2015 0312   GFRAA 79 12/17/2014 1707   Lipase     Component Value Date/Time   LIPASE 80*  01/12/2015 0414       Studies/Results: No results found.  Anti-infectives: Anti-infectives    Start     Dose/Rate Route Frequency Ordered Stop   01/21/15 1200  vancomycin (VANCOCIN) IVPB 1000 mg/200 mL premix     1,000 mg 200 mL/hr over 60 Minutes Intravenous Every M-W-F (Hemodialysis) 01/20/15 1703     01/20/15 1730  vancomycin (VANCOCIN) 2,000 mg in sodium chloride 0.9 % 500 mL IVPB     2,000 mg 250 mL/hr over 120 Minutes Intravenous  Once 01/20/15 1641 01/20/15 1930   01/15/15 2000  piperacillin-tazobactam (ZOSYN) IVPB 2.25 g  Status:  Discontinued     2.25 g 100 mL/hr over 30 Minutes Intravenous 3 times per day 01/15/15 1511 01/17/15 0958   01/11/15 1630  vancomycin (VANCOCIN) 1,250 mg in sodium chloride 0.9 % 250 mL IVPB  Status:  Discontinued     1,250 mg 166.7 mL/hr over 90 Minutes Intravenous Every 24 hours 01/11/15 1029 01/14/15 1150   01/10/15 1300  vancomycin (VANCOCIN) 1,750 mg in sodium chloride 0.9 % 500 mL IVPB  Status:  Discontinued     1,750 mg 250 mL/hr over 120 Minutes Intravenous Every 48 hours 01/08/15 1327 01/09/15 1537   01/10/15 1300  vancomycin (VANCOCIN) 1,250  mg in sodium chloride 0.9 % 250 mL IVPB  Status:  Discontinued     1,250 mg 166.7 mL/hr over 90 Minutes Intravenous Every 24 hours 01/10/15 0947 01/11/15 1029   01/10/15 1200  piperacillin-tazobactam (ZOSYN) IVPB 2.25 g  Status:  Discontinued     2.25 g 100 mL/hr over 30 Minutes Intravenous 4 times per day 01/10/15 0929 01/15/15 1511   01/09/15 1400  piperacillin-tazobactam (ZOSYN) IVPB 2.25 g  Status:  Discontinued     2.25 g 100 mL/hr over 30 Minutes Intravenous 3 times per day 01/09/15 0908 01/10/15 0929   01/09/15 1100  cefTRIAXone (ROCEPHIN) 1 g in dextrose 5 % 50 mL IVPB  Status:  Discontinued     1 g 100 mL/hr over 30 Minutes Intravenous Every 24 hours 01/08/15 1133 01/08/15 1327   01/09/15 1100  azithromycin (ZITHROMAX) 500 mg in dextrose 5 % 250 mL IVPB  Status:  Discontinued     500  mg 250 mL/hr over 60 Minutes Intravenous Every 24 hours 01/08/15 1133 01/08/15 1327   01/08/15 2200  piperacillin-tazobactam (ZOSYN) IVPB 3.375 g  Status:  Discontinued     3.375 g 12.5 mL/hr over 240 Minutes Intravenous 3 times per day 01/08/15 1327 01/09/15 0908   01/08/15 1400  vancomycin (VANCOCIN) 2,500 mg in sodium chloride 0.9 % 500 mL IVPB     2,500 mg 250 mL/hr over 120 Minutes Intravenous  Once 01/08/15 1327 01/08/15 1747   01/08/15 1330  piperacillin-tazobactam (ZOSYN) IVPB 3.375 g  Status:  Discontinued     3.375 g 100 mL/hr over 30 Minutes Intravenous  Once 01/08/15 1316 01/10/15 0929   01/08/15 1130  cefTRIAXone (ROCEPHIN) 1 g in dextrose 5 % 50 mL IVPB     1 g 100 mL/hr over 30 Minutes Intravenous  Once 01/08/15 1126 01/08/15 1220   01/08/15 1130  azithromycin (ZITHROMAX) 500 mg in dextrose 5 % 250 mL IVPB     500 mg 250 mL/hr over 60 Minutes Intravenous  Once 01/08/15 1126 01/08/15 1322       Assessment/Plan   POD 13, 10 s/p ex lap with open abdominal VAC placement and then closure -cont clear liquids for today due to nausea -TNA going, but will be able to wean this as he tolerates oral diet, prealbumin 24.5 -Con't dressing changes BID MMP -per primary service and other specialties DVT prophylaxis -SCDs, heparin drip held due to bleeding  LOS: 13 days    Dell Briner E 01/21/2015, 7:36 AM Pager: [phone]

## 2015-01-21 NOTE — Procedures (Signed)
Hemodialysis Catheter Insertion Procedure Note Jared [name] [id number] 11-12-1955  Procedure: Insertion of Central Venous Catheter Indications: HD  Procedure Details Consent: Risks of procedure as well as the alternatives and risks of each were explained to the (patient/caregiver).  Consent for procedure obtained. Time Out: Verified patient identification, verified procedure, site/side was marked, verified correct patient position, special equipment/implants available, medications/allergies/relevent history reviewed, required imaging and test results available.  Performed  Maximum sterile technique was used including antiseptics, cap, gloves, gown, hand hygiene, mask and sheet. Skin prep: Chlorhexidine; local anesthetic administered A antimicrobial bonded/coated triple lumen HD catheter was placed in the right femoral vein due to multiple attempts, no other available access using the Seldinger technique.  Evaluation Blood flow good Complications: No apparent complications Patient did tolerate procedure well. Chest X-ray ordered to verify placement.  CXR: n/a.   Performed under direct MD supervision.  Performed using ultrasound guidance.  Wire visualized in vessel under ultrasound.   Dirk Dress, NP 01/21/2015  12:06 PM Pager: [phone] or [phone] '

## 2015-01-21 NOTE — Progress Notes (Signed)
ANTICOAGULATION CONSULT NOTE - Follow Up Consult  Pharmacy Consult for Heparin  Indication: Possible PE  Allergies  Allergen Reactions  . Morphine And Related Anaphylaxis and Shortness Of Breath  . Montelukast Other (See Comments)    unknown  . Neurontin [Gabapentin] Other (See Comments)    delusional  . Wellbutrin [Bupropion] Palpitations    Insomnia    Patient Measurements: Weight: 288 lb 12.8 oz (131 kg) Vital Signs: Temp: 98.9 F (37.2 C) (08/31 0349) Temp Source: Axillary (08/31 0349) BP: 140/57 mmHg (08/31 0349) Pulse Rate: 83 (08/31 0349)  Labs:  Recent Labs  01/18/15 0445 01/19/15 0436  01/20/15 0350 01/20/15 0538 01/20/15 1320 01/20/15 2002 01/21/15 0312  HGB 10.9* 10.6*  --  9.8*  --   --   --  9.6*  HCT 31.4* 30.6*  --  28.2*  --   --   --  26.9*  PLT 146* 208  --  120*  --   --   --  145*  HEPARINUNFRC  --   --   < > 0.25*  --  0.55 0.55 0.74*  CREATININE 11.19* 13.12*  --   --  13.86*  --   --   --   < > = values in this interval not displayed.  Estimated Creatinine Clearance: 8.1 mL/min (by C-G formula based on Cr of 13.86).  Assessment: Supra-therapeutic heparin level, minimal oozing noted by RN from previously mentioned wound.   Goal of Therapy:  Heparin level 0.3-0.7 units/ml Monitor platelets by anticoagulation protocol: Yes   Plan:  -Decrease heparin drip to 2600 units/hr -1200 HL -Daily CBC/HL -Monitor for bleeding  Abran Duke 01/21/2015,4:08 AM

## 2015-01-22 ENCOUNTER — Inpatient Hospital Stay (HOSPITAL_COMMUNITY): Payer: Medicare Other

## 2015-01-22 DIAGNOSIS — R410 Disorientation, unspecified: Secondary | ICD-10-CM

## 2015-01-22 DIAGNOSIS — G4733 Obstructive sleep apnea (adult) (pediatric): Secondary | ICD-10-CM | POA: Diagnosis present

## 2015-01-22 DIAGNOSIS — I482 Chronic atrial fibrillation, unspecified: Secondary | ICD-10-CM | POA: Diagnosis present

## 2015-01-22 DIAGNOSIS — I5022 Chronic systolic (congestive) heart failure: Secondary | ICD-10-CM

## 2015-01-22 LAB — CBC
HEMATOCRIT: 24.5 % — AB (ref 39.0–52.0)
Hemoglobin: 8.8 g/dL — ABNORMAL LOW (ref 13.0–17.0)
MCH: 31.1 pg (ref 26.0–34.0)
MCHC: 35.9 g/dL (ref 30.0–36.0)
MCV: 86.6 fL (ref 78.0–100.0)
PLATELETS: 120 10*3/uL — AB (ref 150–400)
RBC: 2.83 MIL/uL — AB (ref 4.22–5.81)
RDW: 15.3 % (ref 11.5–15.5)
WBC: 8.8 10*3/uL (ref 4.0–10.5)

## 2015-01-22 LAB — COMPREHENSIVE METABOLIC PANEL
ALK PHOS: 104 U/L (ref 38–126)
ALT: 99 U/L — ABNORMAL HIGH (ref 17–63)
ANION GAP: 11 (ref 5–15)
AST: 33 U/L (ref 15–41)
Albumin: 1.9 g/dL — ABNORMAL LOW (ref 3.5–5.0)
BUN: 80 mg/dL — ABNORMAL HIGH (ref 6–20)
CALCIUM: 7.9 mg/dL — AB (ref 8.9–10.3)
CO2: 23 mmol/L (ref 22–32)
CREATININE: 9.7 mg/dL — AB (ref 0.61–1.24)
Chloride: 96 mmol/L — ABNORMAL LOW (ref 101–111)
GFR, EST AFRICAN AMERICAN: 6 mL/min — AB (ref >60)
GFR, EST NON AFRICAN AMERICAN: 5 mL/min — AB (ref >60)
Glucose, Bld: 116 mg/dL — ABNORMAL HIGH (ref 65–99)
Potassium: 3.2 mmol/L — ABNORMAL LOW (ref 3.5–5.1)
SODIUM: 130 mmol/L — AB (ref 135–145)
Total Bilirubin: 2.6 mg/dL — ABNORMAL HIGH (ref 0.3–1.2)
Total Protein: 5 g/dL — ABNORMAL LOW (ref 6.5–8.1)

## 2015-01-22 LAB — GLUCOSE, CAPILLARY
GLUCOSE-CAPILLARY: 120 mg/dL — AB (ref 65–99)
GLUCOSE-CAPILLARY: 102 mg/dL — AB (ref 65–99)

## 2015-01-22 LAB — MAGNESIUM: MAGNESIUM: 1.8 mg/dL (ref 1.7–2.4)

## 2015-01-22 LAB — AMMONIA: AMMONIA: 33 umol/L (ref 9–35)

## 2015-01-22 LAB — PHOSPHORUS: PHOSPHORUS: 4.7 mg/dL — AB (ref 2.5–4.6)

## 2015-01-22 LAB — HEPARIN LEVEL (UNFRACTIONATED): Heparin Unfractionated: 0.52 IU/mL (ref 0.30–0.70)

## 2015-01-22 MED ORDER — PANTOPRAZOLE SODIUM 40 MG PO TBEC
40.0000 mg | DELAYED_RELEASE_TABLET | Freq: Every day | ORAL | Status: DC
  Administered 2015-01-23 – 2015-02-02 (×8): 40 mg via ORAL
  Filled 2015-01-22 (×10): qty 1

## 2015-01-22 MED ORDER — ACETAMINOPHEN 325 MG PO TABS
650.0000 mg | ORAL_TABLET | Freq: Three times a day (TID) | ORAL | Status: DC | PRN
  Administered 2015-01-22 – 2015-02-02 (×4): 650 mg via ORAL
  Filled 2015-01-22 (×4): qty 2

## 2015-01-22 MED ORDER — HEPARIN SODIUM (PORCINE) 1000 UNIT/ML DIALYSIS: 20.0000 [IU]/kg | INTRAMUSCULAR | Status: DC | PRN

## 2015-01-22 MED ORDER — VANCOMYCIN HCL IN DEXTROSE 1-5 GM/200ML-% IV SOLN
1000.0000 mg | Freq: Once | INTRAVENOUS | Status: AC
  Administered 2015-01-22: 1000 mg via INTRAVENOUS
  Filled 2015-01-22: qty 200

## 2015-01-22 MED ORDER — ADULT MULTIVITAMIN W/MINERALS CH
1.0000 | ORAL_TABLET | Freq: Every day | ORAL | Status: DC
  Administered 2015-01-22 – 2015-02-11 (×19): 1 via ORAL
  Filled 2015-01-22 (×20): qty 1

## 2015-01-22 NOTE — Progress Notes (Signed)
Pt states he is not ready for cpap and may not wear it tonight; encouraged patient to call if he changed his mind

## 2015-01-22 NOTE — Progress Notes (Signed)
Patient ID: Jared [name], male   DOB: 08/28/1955, 59 y.o.   MRN: [id number] 9 Days Post-Op  Subjective: Pt is incredibly confused this morning.  He doesn't know where he is, what year it is, his DOB, and can only manage to get his first name out, but does not even know what his last name is. Says he tolerated clears well yesterday  Objective: Vital signs in last 24 hours: Temp:  [98.5 F (36.9 C)-100.3 F (37.9 C)] 100.3 F (37.9 C) (09/01 0400) Pulse Rate:  [84-101] 89 (09/01 0430) Resp:  [12-20] 14 (09/01 0430) BP: (115-176)/(44-83) 133/60 mmHg (09/01 0430) SpO2:  [95 %-100 %] 95 % (09/01 0430) Weight:  [132.7 kg (292 lb 8.8 oz)-135.7 kg (299 lb 2.6 oz)] 132.7 kg (292 lb 8.8 oz) (09/01 0325) Last BM Date: 01/21/15  Intake/Output from previous day: 08/31 0701 - 09/01 0700 In: 2204 [I.V.:574; TPN:1630] Out: 3175 [Urine:175] Intake/Output this shift:    PE: HEENT: disconjugate gaze  Abd: soft, +BS, ND, incision is clean and packed Heart: regular   Lab Results:   Recent Labs  01/21/15 0312 01/22/15 0411  WBC 12.3* 8.8  HGB 9.6* 8.8*  HCT 26.9* 24.5*  PLT 145* 120*   BMET  Recent Labs  01/21/15 0312 01/22/15 0411  NA 129* 130*  K 4.2 3.2*  CL 97* 96*  CO2 15* 23  GLUCOSE 103* 116*  BUN 131* 80*  CREATININE 15.16* 9.70*  CALCIUM 8.1* 7.9*   PT/INR No results for input(s): LABPROT, INR in the last 72 hours. CMP     Component Value Date/Time   NA 130* 01/22/2015 0411   K 3.2* 01/22/2015 0411   CL 96* 01/22/2015 0411   CO2 23 01/22/2015 0411   GLUCOSE 116* 01/22/2015 0411   BUN 80* 01/22/2015 0411   CREATININE 9.70* 01/22/2015 0411   CREATININE 1.15 12/17/2014 1707   CALCIUM 7.9* 01/22/2015 0411   PROT 5.0* 01/22/2015 0411   ALBUMIN 1.9* 01/22/2015 0411   AST 33 01/22/2015 0411   ALT 99* 01/22/2015 0411   ALKPHOS 104 01/22/2015 0411   BILITOT 2.6* 01/22/2015 0411   GFRNONAA 5* 01/22/2015 0411   GFRNONAA 68 12/17/2014 1707   GFRAA 6* 01/22/2015  0411   GFRAA 79 12/17/2014 1707   Lipase     Component Value Date/Time   LIPASE 80* 01/12/2015 0414       Studies/Results: No results found.  Anti-infectives: Anti-infectives    Start     Dose/Rate Route Frequency Ordered Stop   01/21/15 1200  vancomycin (VANCOCIN) IVPB 1000 mg/200 mL premix     1,000 mg 200 mL/hr over 60 Minutes Intravenous Every M-W-F (Hemodialysis) 01/20/15 1703     01/20/15 1730  vancomycin (VANCOCIN) 2,000 mg in sodium chloride 0.9 % 500 mL IVPB     2,000 mg 250 mL/hr over 120 Minutes Intravenous  Once 01/20/15 1641 01/20/15 1930   01/15/15 2000  piperacillin-tazobactam (ZOSYN) IVPB 2.25 g  Status:  Discontinued     2.25 g 100 mL/hr over 30 Minutes Intravenous 3 times per day 01/15/15 1511 01/17/15 0958   01/11/15 1630  vancomycin (VANCOCIN) 1,250 mg in sodium chloride 0.9 % 250 mL IVPB  Status:  Discontinued     1,250 mg 166.7 mL/hr over 90 Minutes Intravenous Every 24 hours 01/11/15 1029 01/14/15 1150   01/10/15 1300  vancomycin (VANCOCIN) 1,750 mg in sodium chloride 0.9 % 500 mL IVPB  Status:  Discontinued     1,750 mg 250  mL/hr over 120 Minutes Intravenous Every 48 hours 01/08/15 1327 01/09/15 1537   01/10/15 1300  vancomycin (VANCOCIN) 1,250 mg in sodium chloride 0.9 % 250 mL IVPB  Status:  Discontinued     1,250 mg 166.7 mL/hr over 90 Minutes Intravenous Every 24 hours 01/10/15 0947 01/11/15 1029   01/10/15 1200  piperacillin-tazobactam (ZOSYN) IVPB 2.25 g  Status:  Discontinued     2.25 g 100 mL/hr over 30 Minutes Intravenous 4 times per day 01/10/15 0929 01/15/15 1511   01/09/15 1400  piperacillin-tazobactam (ZOSYN) IVPB 2.25 g  Status:  Discontinued     2.25 g 100 mL/hr over 30 Minutes Intravenous 3 times per day 01/09/15 0908 01/10/15 0929   01/09/15 1100  cefTRIAXone (ROCEPHIN) 1 g in dextrose 5 % 50 mL IVPB  Status:  Discontinued     1 g 100 mL/hr over 30 Minutes Intravenous Every 24 hours 01/08/15 1133 01/08/15 1327   01/09/15 1100   azithromycin (ZITHROMAX) 500 mg in dextrose 5 % 250 mL IVPB  Status:  Discontinued     500 mg 250 mL/hr over 60 Minutes Intravenous Every 24 hours 01/08/15 1133 01/08/15 1327   01/08/15 2200  piperacillin-tazobactam (ZOSYN) IVPB 3.375 g  Status:  Discontinued     3.375 g 12.5 mL/hr over 240 Minutes Intravenous 3 times per day 01/08/15 1327 01/09/15 0908   01/08/15 1400  vancomycin (VANCOCIN) 2,500 mg in sodium chloride 0.9 % 500 mL IVPB     2,500 mg 250 mL/hr over 120 Minutes Intravenous  Once 01/08/15 1327 01/08/15 1747   01/08/15 1330  piperacillin-tazobactam (ZOSYN) IVPB 3.375 g  Status:  Discontinued     3.375 g 100 mL/hr over 30 Minutes Intravenous  Once 01/08/15 1316 01/10/15 0929   01/08/15 1130  cefTRIAXone (ROCEPHIN) 1 g in dextrose 5 % 50 mL IVPB     1 g 100 mL/hr over 30 Minutes Intravenous  Once 01/08/15 1126 01/08/15 1220   01/08/15 1130  azithromycin (ZITHROMAX) 500 mg in dextrose 5 % 250 mL IVPB     500 mg 250 mL/hr over 60 Minutes Intravenous  Once 01/08/15 1126 01/08/15 1322       Assessment/Plan   POD 14, 11 s/p ex lap with open abdominal VAC placement and then closure -advance to full liquids and then advance diet as tolerates  -will start weaning TNA -Con't dressing changes BID MMP -per primary service and other specialties -called primary service about confusion. Will defer work up or evaluation to them DVT prophylaxis -SCDs, heparin drip held due to bleeding  LOS: 14 days    Kaelene Elliston E 01/22/2015, 7:46 AM Pager: [phone]

## 2015-01-22 NOTE — Progress Notes (Signed)
Physical Therapy Treatment Patient Details Name: Jared [name] MRN: [id number] DOB: [birth year] Today's Date: 01/22/2015    History of Present Illness Pt is a 59 y/o male with a PMH of chronic back pain, chest pain, HTN, hyerlipidemia, obesity, sleep apnea, depression. Pt was found down at home by wife on 01/08/15. He was intubated due to acute respiratory failure. Pt had exploratory laparotomy, cardiac catheterization, and received dialysis treatment. Pt's primary issues are pulmonary embolism and difficulty with his abdominal wound. Pt's active problems are spetic shock, acute respiratory failure with hypoxia, acute respiratory failure with hypoxemia, cardigenic shock, acute kidney injury, and renal failure. Pt was extubated on 01/18/15 after a 10 day intubation period.    PT Comments    Patient in bed, agreeable to participate in PT today. Patient pleasantly confused, but awake throughout. Patient was limited to performing bed exercise as described below due to R LE temporary HD cath. Patient will benefit from continued PT to address strength and ROM deficits as well as progress mobility as medically allowed.  Follow Up Recommendations  CIR;Supervision/Assistance - 24 hour     Equipment Recommendations  Other (comment) (TBA)    Recommendations for Other Services       Precautions / Restrictions Precautions Precautions: Fall;Other (comment) (R LE temporary HD cath) Precaution Comments: No movement of R LE Restrictions Weight Bearing Restrictions: No    Mobility  Bed Mobility               General bed mobility comments: Bed mobility deferred due to temporary HD cath in R LE.  Transfers                    Ambulation/Gait                 Stairs            Wheelchair Mobility    Modified Rankin (Stroke Patients Only)       Balance                                    Cognition Arousal/Alertness: Lethargic Behavior During Therapy:  WFL for tasks assessed/performed Overall Cognitive Status: No family/caregiver present to determine baseline cognitive functioning Area of Impairment: Orientation;Following commands;Problem solving Orientation Level: Disoriented to;Place;Situation;Time Current Attention Level: Focused   Following Commands: Follows one step commands inconsistently;Follows one step commands with increased time     Problem Solving: Slow processing;Requires verbal cues;Requires tactile cues;Decreased initiation General Comments: Patient awake throughout session. Pleasantly confused, able to tell me his name once but not another time. Understands he is in the hospital but not sure which one. Responses to questions often have little to do with the questions themselves. I.e. "can you move your arm?" "Well we need to go to the club."    Exercises General Exercises - Upper Extremity Shoulder Flexion: PROM;Both;10 reps;Supine Shoulder ABduction: PROM;Both;10 reps;Supine Shoulder ADduction: PROM;Both;10 reps;Supine Shoulder Horizontal ABduction: AAROM;10 reps;Supine Shoulder Horizontal ADduction: AAROM;10 reps;Both;Supine Elbow Flexion: AAROM;Both;10 reps;Supine Elbow Extension: PROM;Both;10 reps;Supine Digit Composite Flexion: AROM;Both;5 reps;Supine (squeeze therapist fingers) General Exercises - Lower Extremity Ankle Circles/Pumps: PROM;Left;10 reps;Supine Short Arc Quad: Left;PROM;10 reps;Supine    General Comments        Pertinent Vitals/Pain Pain Assessment: No/denies pain    Home Living                      Prior Function  PT Goals (current goals can now be found in the care plan section) Acute Rehab PT Goals Patient Stated Goal: Get out of here. PT Goal Formulation: With patient Time For Goal Achievement: 02/02/15 Potential to Achieve Goals: Fair Progress towards PT goals: Not progressing toward goals - comment (Due to medical complications - temporary HD cath)     Frequency  Min 3X/week    PT Plan Current plan remains appropriate    Co-evaluation             End of Session   Activity Tolerance: Treatment limited secondary to medical complications (Comment);Patient limited by fatigue (temporary HD cath in R LE) Patient left: in bed;with bed alarm set;with nursing/sitter in room;with SCD's reapplied     Time: 1610-9604 PT Time Calculation (min) (ACUTE ONLY): 23 min  Charges:  $Therapeutic Exercise: 23-37 mins                    G CodesMichele Rockers, SPT [phone] 01/22/2015, 10:36 AM  I have read, reviewed and agree with student's note.   [facility] Acute Rehabilitation [phone] (pager)

## 2015-01-22 NOTE — Progress Notes (Signed)
Patient ID: Jared [name], male   DOB: Feb 09, 1956, 59 y.o.   MRN: [id number]  S:  New HD femoral Cath yesterday with PCCM, grateful for assistance HD overnight 4h, 3L UF, tolerated well   O:BP 146/71 mmHg  Pulse 90  Temp(Src) 101.4 F (38.6 C) (Oral)  Resp 17  Wt 132.7 kg (292 lb 8.8 oz)  SpO2 99%  Intake/Output Summary (Last 24 hours) at 01/22/15 1121 Last data filed at 01/22/15 0700  Gross per 24 hour  Intake 2662.53 ml  Output   3225 ml  Net -562.47 ml   Intake/Output: I/O last 3 completed shifts: In: 5062.7 [I.V.:1432.7] Out: 3435 [Urine:435; Other:3000]  Intake/Output this shift:    Weight change: 4.7 kg (10 lb 5.8 oz) Gen: pleasant male, lying in bed, Heart: RRR, no m/r/g Resp:bibasilar crackles.  Abd:+BS, binder in place, tender to palpation EXT: trace - 1+ edema Neuro: awake today, oriented to self only, tangential   Recent Labs Lab 01/16/15 0340 01/16/15 1240 01/17/15 0400 01/18/15 0445 01/19/15 0436 01/20/15 0538 01/21/15 0312 01/22/15 0411  NA 134* 138 137 134* 132* 129* 129* 130*  K 3.7 4.1 4.1 4.6 4.4 4.3 4.2 3.2*  CL 101 104 104 103 99* 97* 97* 96*  CO2 20* 24 22 19* 17* 17* 15* 23  GLUCOSE 109* 125* 128* 124* 97 107* 103* 116*  BUN 79* 49* 67* 91* 110* 112* 131* 80*  CREATININE 9.20* 6.65* 8.59* 11.19* 13.12* 13.86* 15.16* 9.70*  ALBUMIN 1.8*  --  2.0* 1.8* 1.9* 1.9* 1.9* 1.9*  CALCIUM 7.8* 7.8* 7.9* 8.2* 8.4* 8.0* 8.1* 7.9*  PHOS 5.4*  --  4.1 6.1* 7.0* 7.6*  --  4.7*  AST  --   --   --   --  44*  --  30 33  ALT  --   --   --   --  183*  --  115* 99*   Liver Function Tests:  Recent Labs Lab 01/19/15 0436 01/20/15 0538 01/21/15 0312 01/22/15 0411  AST 44*  --  30 33  ALT 183*  --  115* 99*  ALKPHOS 80  --  87 104  BILITOT 5.2*  --  3.1* 2.6*  PROT 5.0*  --  4.8* 5.0*  ALBUMIN 1.9* 1.9* 1.9* 1.9*   No results for input(s): LIPASE, AMYLASE in the last 168 hours. No results for input(s): AMMONIA in the last 168  hours. CBC:  Recent Labs Lab 01/18/15 0445 01/19/15 0436 01/20/15 0350 01/21/15 0312 01/22/15 0411  WBC 11.5* 12.3* 13.3* 12.3* 8.8  NEUTROABS 9.4* 10.5*  --  9.1*  --   HGB 10.9* 10.6* 9.8* 9.6* 8.8*  HCT 31.4* 30.6* 28.2* 26.9* 24.5*  MCV 89.5 87.7 89.8 87.3 86.6  PLT 146* 208 120* 145* 120*   Cardiac Enzymes: No results for input(s): CKTOTAL, CKMB, CKMBINDEX, TROPONINI in the last 168 hours. CBG:  Recent Labs Lab 01/21/15 0745 01/21/15 1329 01/21/15 1622 01/21/15 2250 01/22/15 0611  GLUCAP 94 98 99 106* 120*    Iron Studies: No results for input(s): IRON, TIBC, TRANSFERRIN, FERRITIN in the last 72 hours. Studies/Results: No results found. Marland Kitchen antiseptic oral rinse  7 mL Mouth Rinse QID  . aspirin  81 mg Oral Daily  . chlorhexidine gluconate  15 mL Mouth Rinse BID  . furosemide  80 mg Intravenous BID  . heparin flush  20 Units Intracatheter Q12H  . multivitamin with minerals  1 tablet Oral Daily  . pantoprazole  40 mg Oral  QHS  . sodium chloride  10 mL Intravenous Q12H  . vancomycin  1,000 mg Intravenous Q M,W,F-HD  . vancomycin  1,000 mg Intravenous Once    BMET    Component Value Date/Time   NA 130* 01/22/2015 0411   K 3.2* 01/22/2015 0411   CL 96* 01/22/2015 0411   CO2 23 01/22/2015 0411   GLUCOSE 116* 01/22/2015 0411   BUN 80* 01/22/2015 0411   CREATININE 9.70* 01/22/2015 0411   CREATININE 1.15 12/17/2014 1707   CALCIUM 7.9* 01/22/2015 0411   GFRNONAA 5* 01/22/2015 0411   GFRNONAA 68 12/17/2014 1707   GFRAA 6* 01/22/2015 0411   GFRAA 79 12/17/2014 1707   CBC    Component Value Date/Time   WBC 8.8 01/22/2015 0411   RBC 2.83* 01/22/2015 0411   HGB 8.8* 01/22/2015 0411   HCT 24.5* 01/22/2015 0411   PLT 120* 01/22/2015 0411   MCV 86.6 01/22/2015 0411   MCH 31.1 01/22/2015 0411   MCHC 35.9 01/22/2015 0411   RDW 15.3 01/22/2015 0411   LYMPHSABS 1.0 01/21/2015 0312   MONOABS 1.1* 01/21/2015 0312   EOSABS 1.0* 01/21/2015 0312   BASOSABS 0.1  01/21/2015 0312     Assessment/Plan:  1. AoCKD, oliguric presumably due to septic shock related ATN 1. CVVHD stopped 01/14/15 and had first intermittent HD 01/16/15.   2. No evidence of recovered GFR, remains iHD dependent 3. Next HD 9/2, using temp HD cath 4. Once afebrile and not clear infection, need TDC 5. Cont IV lasix for now to help with volume -- unsure if effective 2. SIRS- possible PE (RA and RV dilated, RV hypocontractile on ECHO). CT angio on hold for now (concerns for renal recovery as well as IV access).plan is long term anticoagulation without CT angio Currently off of pressors and excubated. 3. CAD- cath revealed occluded OM, cardiology following 4. Shock liver- improved.  5. S/p exploratory lap and fascia closure now s/p wound vac - with some bleeding, improved after holding anticoag, now back on anticoag.  6. Metabolic acidosis 7. Hyponatremia.  8. Thrombocytopenia- hit panel neg. Likely 2/2 to shocked liver. Now improved. Back on heparin - no bleeding currently. plt down again today, needs close monitoring.  9. Hypoalbuminemia- per primary svc 10. Nutrition: has TPN followed by [facility] 11. Fever 8/30: temp HD cath removed.  Started Vanc.  B Cx x2 NGTD   Naudia Crosley B

## 2015-01-22 NOTE — Progress Notes (Signed)
I continue to follow pt's progress with new HD femoral catheter which limits therapy progress. Await medical stability to assist with dispo planning. [phone]

## 2015-01-22 NOTE — Care Management Note (Signed)
Case Management Note  Patient Details  Name: Jared [name] MRN: [id number] Date of Birth: [birth year]  Subjective/Objective:      Spoke with pt and wife @ bedside.  Inpatient Rehab Admissions Coordinator is following but pt currently unable to participate in therapy because of femoral HD cath.  Discussed options for pt when he is medically stable for transfer.            Action/Plan: CM will continue to follow.               Expected Discharge Plan:  IP Rehab Facility  Discharge planning Services  CM Consult  Status of Service:  In process, will continue to follow  Medicare Important Message Given:  Yes-second notification given  Additional Comments:  Magdalene River, RN 01/22/2015, 3:10 PM

## 2015-01-22 NOTE — Progress Notes (Signed)
PARENTERAL NUTRITION/HEPARIN/VANCOMYCIN CONSULT NOTE - Follow-up  Pharmacy Consult for TPN, VANCOMYCIN, HEPARIN Indication: Prolonged Ileus, FEVER, PE  Allergies  Allergen Reactions  . Morphine And Related Anaphylaxis and Shortness Of Breath  . Montelukast Other (See Comments)    unknown  . Neurontin [Gabapentin] Other (See Comments)    delusional  . Wellbutrin [Bupropion] Palpitations    Insomnia    Patient Measurements: Weight: 292 lb 8.8 oz (132.7 kg) Usual Weight: 138 kg on admission (8/20)  Vital Signs: Temp: 101.4 F (38.6 C) (09/01 0751) Temp Source: Oral (09/01 0751) BP: 145/69 mmHg (09/01 0800) Pulse Rate: 90 (09/01 0800) Intake/Output from previous day: 08/31 0701 - 09/01 0700 In: 3212 [I.V.:782; TPN:2430] Out: 3225 [Urine:225] Intake/Output from this shift:    Labs:  Recent Labs  01/20/15 0350 01/21/15 0312 01/22/15 0411  WBC 13.3* 12.3* 8.8  HGB 9.8* 9.6* 8.8*  HCT 28.2* 26.9* 24.5*  PLT 120* 145* 120*     Recent Labs  01/20/15 0538 01/21/15 0312 01/22/15 0411  NA 129* 129* 130*  K 4.3 4.2 3.2*  CL 97* 97* 96*  CO2 17* 15* 23  GLUCOSE 107* 103* 116*  BUN 112* 131* 80*  CREATININE 13.86* 15.16* 9.70*  CALCIUM 8.0* 8.1* 7.9*  MG 2.2 2.2 1.8  PHOS 7.6*  --  4.7*  PROT  --  4.8* 5.0*  ALBUMIN 1.9* 1.9* 1.9*  AST  --  30 33  ALT  --  115* 99*  ALKPHOS  --  87 104  BILITOT  --  3.1* 2.6*  TRIG 213*  --   --    Estimated Creatinine Clearance: 11.7 mL/min (by C-G formula based on Cr of 9.7).    Recent Labs  01/21/15 1622 01/21/15 2250 01/22/15 0611  GLUCAP 99 106* 120*   Insulin Requirements in the past 24 hours:  0 units SSI last 24h  Current Nutrition:  Clinimix 5/15 at 123ml/hr  Assessment: 59 yo M presents on 8/18 after being found down at home. Was responsive to Narcan and then found to be hypotensive with elevated WBC count in ED. Went to surgery on 8/18 for ex lap but everything looked ok. Being treated for acute resp  failure, RV failure with presumed PE, ATN with metabolic acidosis.   Surgeries/Procedures:  8/18: Ex lap: no ischemic bowel or perforation was found.  8/21 went back to OR for closure of abdominal fascia and placement of wound VAC.  GI:  PPI IV. Prealbumin 12.8>24.5.  D/w Barnetta Chapel, [facility] - decrease TPN to 1/2 rate then DC today  Endo: CBGs well controlled  Lytes: Na 130 remains low, K 3.2, Phos 4.8, mag 1.8 - no lytes in TPN  Renal: AKI/CKD2 (baseline Scr 1.14), Was on CRRT but stopped 8/24. Started on iHD 8/26 prn. eplace temp HD catheter 8/31, got HD 8/31  Pulm: Intubated 8/18>>8/28 - now RA  Cards: RV failure with presumed PE. 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 HIT panel negative. Stopped due to bleeding. CT on hold due to concerns for renal function (try VQ scan). Cath=occluded OM. Hgb 10.9>9.6>8.8, Plts 208>120>145>120 (dramatic drop again).Heparin drip resumed 8/29 . Some oozing from wound noted 8/29 PM. ZOX09. Heparin level 0.52 is therapeutic on 2600 units/hr.  Hepatobil: Shock liver. LFTs trending down. Albumin low at 1.9. Tbili 5.2>3.1> 2.6  remains elevated (no jaundice), trigs 323>213.  Neuro:chronic back pain, anxiety, depression (Holding xanax, celexa, oxycodone, lyrica), confusion 9/1  ID: T max 101.4, WBC 12.3>  8.8. 8/30 MD noted generalized erythematous papules. Looksl ike drug rash? Mild temp after HD 8/30--started Vanco and removed HD catheter. Got HD 8/31 but vanc 1 gm not given after HD as ordered..  8/30 BC x 2>>ngtd 8/30 PAC>ngtd 8/18 Blood cx >NEG 8/18 Urine cx >NEG  8/18 Vancomycin >> 8/23, 8/30>> 8/18 Zosyn >>8/27  Best Practices: SCDs, heparin IV, MC  TPN Access: 8/20 - CVC triple lumen  TPN start date: 8/25 >>9/1  Plan:  - decrease TPN to 1/2 rate now then off at 1800 - change IV PPI to PO and add PO MVI - DC SSI/CBGs - continue heparin drip at 2600 units/hr -daily HL and CBC -vancomycin 1 mg IV now since dose not  given after HD last night, then continue vanc 1 gm IV after HD MWF - f/u HD schedule closely -electrolyte replacement per renal service  Herby Abraham, Pharm.D. [phone] 01/22/2015 9:14 AM

## 2015-01-22 NOTE — Progress Notes (Signed)
TEAM 1 - Stepdown/ICU TEAM Progress Note  Jared [name] RUE:[id number] DOB: [birth year] DOA: 01/08/2015 PCP: Nadean Corwin, MD  Admit HPI / Brief Narrative: 59 year old WM PMHx Depression, Anxiety, HTN, HLD, OSA,Chronic Back Pain.  Presented 8/18 after being found down at home. Responsive to narcan via EMS. In ED was profoundly hypotensive with elevated WBC, refractory to volume. In ER noted to have lactate 8.5, hyperkalemia and new started on pressors in ED. PCCM to admit.    HPI/Subjective: 9/1  received a call from PA [employer] ([facility]) this a.m. secondary to patient having altered mental status. Review of overnight events reveals that patient received Dilaudid IV 2 mg, with the most recent dose~0645. However patient has also spiked a fever 2 MAXIMUM TEMPERATURE 38.6C. Patient is A/O 1, (does not know where, when, why)  Assessment/Plan: Sepsis with presumed abdominal source.>exp lap was neg .  -Day 7 vanc completed 01/14/15 -Day 10 Zosyn to be stopped 01/17/15 -Altered mental status with patient spiking fevers obtain blood culture 2, urine culture, PCXR  Acute abdominal pain s/p laparotomy -Wound VAC DC'd -Surgical incision covered and clean negative sign of infection  -RN to page attending to bedside in the a.m. during dressing change to do a full evaluation of surgical site -Patient passed swallow eval; nectar thick diet as ordered.  ATN with metab acidosis and hyperkalemia  - renal US neg  - CRRT started per renal 8/20 - stopped 01/15/15  - On Int HD since 01/16/15; HD today  -RF and autoimmune panel negative 01/14/15  CKD(baseline creatinine 1.14 from 12/17/14)   OSA -CPAP per respiratory  Anemia/thrombocytopenia -Most likely multifactorial to include acute illness, recent surgery, and poor diet. - HIT panel negative.  Shock liver -Improving? Obtain labs in the a.m.  A- Fib.  -Resolved. Off amio since 01/11/15 -Currently in NSR  Systolic  CHF/RV dilation -Daily a.m. Weight -Strict in and out; since admission + 10.6 L -Continue Lasix 80 mg BID per cardiology  Pulmonary hypertension -See systolic CHF  Prediabetes  -7/27 Hemoglobin A1c= 5.7 -Continue sensitive SSI   Altered mental status -Possibly multifactorial to include uremia (less likely as BUN has decreased), new abdominal infection (surgery believes patient's abdomen now benign), pneumonia, bacteremia, UTI, elevated ammonia level, CVA. -Review of Dr. Vassie Moment note from 8/31 reveals that patient was also confused yesterday. Patient may be having periods of waxing and waning Hospital Delirium  -Patient's cognitive level has significantly changed from Tuesday. Patient A/O 1 (does not know where, when, why) spiking fevers. -Obtain PCXR, blood cultures, urine cultures     Code Status: FULL Family Communication: no family present at time of exam Disposition Plan: Per surgery    Consultants: Dr.Matthew Tsuei ([facility])     Procedure/Significant Events: 8/18 Echo >> EF 45 to 50%, mod RV dilation, mod/severe RV dysfx, PAS 47 mmHg 8/18 Renal u/s >> b/l renal echogenicity  SIGNIFICANT EVENTS: 8/18 Admit, [facility], cardiology consulted; brief PEA arrest; laparotomy with placement of wound vac; started heparin gtt for presumed PE 8/20 CRRT  8/21 OR for abd wound closure  01/12/15; For cath later 01/12/2015 per Dr Jomarie Longs. On pressors. Anuric. On CRRT. Off pressors. Improved Shock Liver. On fent gtt and heparing gtt. 40% fio2 8/23: [facility] diagnosis ileus 8/23 cardiac catheterization; left circumflex, second obtuse marginal branch,Ost 2d Mrg-2d Mrg lesion or percent stenosed 8/25 HIT Ab screen normal; TNA being initiated  01/16/15: Bleeding around abd wound - surface bled and IV heparin held yesterday.  Culture Culture - negative HIV negative 01/14/15  Antibiotics: Day 7 vanc completed 01/14/15 Vancomycin 8/31 with HD M/W/F>> Day 10 Zosyn to be stopped  01/17/15  DVT prophylaxis: Heparin drip   Devices    LINES / TUBES:  RIJ HD 8/20 >> LIJ 8/20 >>    Continuous Infusions: . heparin 2,600 Units/hr (01/22/15 1545)    Objective: VITAL SIGNS: Temp: 98.9 F (37.2 C) (09/01 2000) Temp Source: Oral (09/01 2000) BP: 142/51 mmHg (09/01 2000) Pulse Rate: 84 (09/01 1500) SPO2; FIO2:   Intake/Output Summary (Last 24 hours) at 01/22/15 2349 Last data filed at 01/22/15 2000  Gross per 24 hour  Intake   1034 ml  Output   3200 ml  Net  -2166 ml     Exam: General:  A/O 1, (does not know where, when, why), NAD, No acute respiratory distress Eyes: Negative headache, eye pain, double vision,negative scleral hemorrhage ENT: Negative Runny nose, negative ear pain, negative tinnitus, negative gingival bleeding, Neck:  Negative scars, masses, torticollis, lymphadenopathy, JVD Lungs: Clear to auscultation bilaterally without wheezes or crackles Cardiovascular: Regular rate and rhythm without murmur gallop or rub normal S1 and S2 Abdomen: Midline incision covered and clean, some serosanguineous fluid on dressing, negative sign of infection, pink granulation tissue.Negative abdominal pain (appropriate for recent surgery), negative dysphagia, distended, positive soft, bowel sounds, no rebound, no ascites, no appreciable mass Extremities: No significant cyanosis, clubbing, or edema bilateral lower extremities Psychiatric:  Negative depression, negative anxiety, negative fatigue, negative mania Neurologic:  Cranial nerves II through XII intact, tongue/uvula midline, all extremities muscle strength 5/5, sensation intact throughout, negative dysarthria, negative expressive aphasia, negative receptive aphasia.      Data Reviewed: Basic Metabolic Panel:  Recent Labs Lab 01/17/15 0400 01/18/15 0445 01/19/15 0436 01/20/15 0538 01/21/15 0312 01/22/15 0411  NA 137 134* 132* 129* 129* 130*  K 4.1 4.6 4.4 4.3 4.2 3.2*  CL 104 103 99* 97*  97* 96*  CO2 22 19* 17* 17* 15* 23  GLUCOSE 128* 124* 97 107* 103* 116*  BUN 67* 91* 110* 112* 131* 80*  CREATININE 8.59* 11.19* 13.12* 13.86* 15.16* 9.70*  CALCIUM 7.9* 8.2* 8.4* 8.0* 8.1* 7.9*  MG 2.4 2.4 2.3 2.2 2.2 1.8  PHOS 4.1 6.1* 7.0* 7.6*  --  4.7*   Liver Function Tests:  Recent Labs Lab 01/18/15 0445 01/19/15 0436 01/20/15 0538 01/21/15 0312 01/22/15 0411  AST  --  44*  --  30 33  ALT  --  183*  --  115* 99*  ALKPHOS  --  80  --  87 104  BILITOT  --  5.2*  --  3.1* 2.6*  PROT  --  5.0*  --  4.8* 5.0*  ALBUMIN 1.8* 1.9* 1.9* 1.9* 1.9*   No results for input(s): LIPASE, AMYLASE in the last 168 hours.  Recent Labs Lab 01/22/15 1743  AMMONIA 33   CBC:  Recent Labs Lab 01/16/15 0340 01/17/15 0400 01/18/15 0445 01/19/15 0436 01/20/15 0350 01/21/15 0312 01/22/15 0411  WBC 10.2 10.5 11.5* 12.3* 13.3* 12.3* 8.8  NEUTROABS 7.9* 7.8* 9.4* 10.5*  --  9.1*  --   HGB 10.6* 11.4* 10.9* 10.6* 9.8* 9.6* 8.8*  HCT 30.1* 33.0* 31.4* 30.6* 28.2* 26.9* 24.5*  MCV 86.7 86.8 89.5 87.7 89.8 87.3 86.6  PLT 96* 122* 146* 208 120* 145* 120*   Cardiac Enzymes: No results for input(s): CKTOTAL, CKMB, CKMBINDEX, TROPONINI in the last 168 hours. BNP (last 3 results)  Recent Labs  01/08/15 1100  BNP 495.3*    ProBNP (last 3 results) No results for input(s): PROBNP in the last 8760 hours.  CBG:  Recent Labs Lab 01/21/15 1329 01/21/15 1622 01/21/15 2250 01/22/15 0611 01/22/15 1646  GLUCAP 98 99 106* 120* 102*    Recent Results (from the past 240 hour(s))  Culture, blood (routine x 2)     Status: None (Preliminary result)   Collection Time: 01/20/15  3:48 PM  Result Value Ref Range Status   Specimen Description BLOOD PORTA CATH  Final   Special Requests BOTTLES DRAWN AEROBIC AND ANAEROBIC  Final   Culture NO GROWTH 2 DAYS  Final   Report Status PENDING  Incomplete  Culture, blood (routine x 2)     Status: None (Preliminary result)   Collection Time:  01/20/15  4:25 PM  Result Value Ref Range Status   Specimen Description BLOOD PORTA CATH  Final   Special Requests BOTTLES DRAWN AEROBIC AND ANAEROBIC  Final   Culture NO GROWTH 2 DAYS  Final   Report Status PENDING  Incomplete     Studies:  Recent x-ray studies have been reviewed in detail by the Attending Physician  Scheduled Meds:  Scheduled Meds: . antiseptic oral rinse  7 mL Mouth Rinse QID  . aspirin  81 mg Oral Daily  . chlorhexidine gluconate  15 mL Mouth Rinse BID  . furosemide  80 mg Intravenous BID  . heparin flush  20 Units Intracatheter Q12H  . multivitamin with minerals  1 tablet Oral Daily  . pantoprazole  40 mg Oral QHS  . sodium chloride  10 mL Intravenous Q12H  . vancomycin  1,000 mg Intravenous Q M,W,F-HD    Time spent on care of this patient: 40 mins   WOODS, Roselind Messier , MD  [facility] Office  [phone] Pager - [phone]  On-Call/Text Page:      [email or web address]      password [email or web address]  If 7PM-7AM, please contact night-coverage [email or web address] Password [email or web address] 01/22/2015, 11:49 PM   LOS: 14 days   Care during the described time interval was provided by me .  I have reviewed this patient's available data, including medical history, events of note, physical examination, and all test results as part of my evaluation. I have personally reviewed and interpreted all radiology studies.   Carolyne Littles, MD [phone] Pager

## 2015-01-23 LAB — CBC
HEMATOCRIT: 23.4 % — AB (ref 39.0–52.0)
HEMOGLOBIN: 8.1 g/dL — AB (ref 13.0–17.0)
MCH: 30.2 pg (ref 26.0–34.0)
MCHC: 34.6 g/dL (ref 30.0–36.0)
MCV: 87.3 fL (ref 78.0–100.0)
Platelets: 158 10*3/uL (ref 150–400)
RBC: 2.68 MIL/uL — AB (ref 4.22–5.81)
RDW: 15.4 % (ref 11.5–15.5)
WBC: 5.2 10*3/uL (ref 4.0–10.5)

## 2015-01-23 LAB — RENAL FUNCTION PANEL
ALBUMIN: 1.8 g/dL — AB (ref 3.5–5.0)
ANION GAP: 14 (ref 5–15)
BUN: 114 mg/dL — ABNORMAL HIGH (ref 6–20)
CHLORIDE: 97 mmol/L — AB (ref 101–111)
CO2: 18 mmol/L — ABNORMAL LOW (ref 22–32)
Calcium: 8.2 mg/dL — ABNORMAL LOW (ref 8.9–10.3)
Creatinine, Ser: 14.49 mg/dL — ABNORMAL HIGH (ref 0.61–1.24)
GFR calc Af Amer: 4 mL/min — ABNORMAL LOW (ref >60)
GFR, EST NON AFRICAN AMERICAN: 3 mL/min — AB (ref >60)
Glucose, Bld: 78 mg/dL (ref 65–99)
PHOSPHORUS: 7 mg/dL — AB (ref 2.5–4.6)
POTASSIUM: 3.6 mmol/L (ref 3.5–5.1)
Sodium: 129 mmol/L — ABNORMAL LOW (ref 135–145)

## 2015-01-23 LAB — MAGNESIUM: MAGNESIUM: 2 mg/dL (ref 1.7–2.4)

## 2015-01-23 LAB — GLUCOSE, CAPILLARY
GLUCOSE-CAPILLARY: 78 mg/dL (ref 65–99)
GLUCOSE-CAPILLARY: 76 mg/dL (ref 65–99)

## 2015-01-23 LAB — URINE CULTURE: Culture: NO GROWTH

## 2015-01-23 LAB — HEPARIN LEVEL (UNFRACTIONATED)
HEPARIN UNFRACTIONATED: 0.89 [IU]/mL — AB (ref 0.30–0.70)
Heparin Unfractionated: 0.86 IU/mL — ABNORMAL HIGH (ref 0.30–0.70)

## 2015-01-23 LAB — VANCOMYCIN, RANDOM: VANCOMYCIN RM: 38 ug/mL

## 2015-01-23 MED ORDER — RESOURCE THICKENUP CLEAR PO POWD
ORAL | Status: DC | PRN
  Filled 2015-01-23: qty 125

## 2015-01-23 MED ORDER — STARCH (THICKENING) PO POWD: ORAL | Status: DC | PRN

## 2015-01-23 NOTE — Progress Notes (Signed)
Patient ID: Jared [name], male   DOB: November 15, 1955, 59 y.o.   MRN: [id number]  S:  No new events  Afebrile, B Cx NGTD on Vanc Pt remains confused Wife present, updated  O:BP 124/54 mmHg  Pulse 78  Temp(Src) 99 F (37.2 C) (Oral)  Resp 18  Wt 130.8 kg (288 lb 5.8 oz)  SpO2 96%  Intake/Output Summary (Last 24 hours) at 01/23/15 1057 Last data filed at 01/23/15 0600  Gross per 24 hour  Intake    286 ml  Output    250 ml  Net     36 ml   Intake/Output: I/O last 3 completed shifts: In: 1818 [I.V.:618] Out: 3475 [Urine:475; Other:3000]  Intake/Output this shift:    Weight change: -4.9 kg (-10 lb 12.8 oz) Gen: pleasant male, lying in bed, Heart: RRR, no m/r/g Resp:bibasilar crackles.  Abd:+BS, binder in place, tender to palpation EXT: trace - 1+ edema Neuro: awake today, oriented to self only, tangential   Recent Labs Lab 01/17/15 0400 01/18/15 0445 01/19/15 0436 01/20/15 0538 01/21/15 0312 01/22/15 0411 01/23/15 0716  NA 137 134* 132* 129* 129* 130* 129*  K 4.1 4.6 4.4 4.3 4.2 3.2* 3.6  CL 104 103 99* 97* 97* 96* 97*  CO2 22 19* 17* 17* 15* 23 18*  GLUCOSE 128* 124* 97 107* 103* 116* 78  BUN 67* 91* 110* 112* 131* 80* 114*  CREATININE 8.59* 11.19* 13.12* 13.86* 15.16* 9.70* 14.49*  ALBUMIN 2.0* 1.8* 1.9* 1.9* 1.9* 1.9* 1.8*  CALCIUM 7.9* 8.2* 8.4* 8.0* 8.1* 7.9* 8.2*  PHOS 4.1 6.1* 7.0* 7.6*  --  4.7* 7.0*  AST  --   --  44*  --  30 33  --   ALT  --   --  183*  --  115* 99*  --    Liver Function Tests:  Recent Labs Lab 01/19/15 0436  01/21/15 0312 01/22/15 0411 01/23/15 0716  AST 44*  --  30 33  --   ALT 183*  --  115* 99*  --   ALKPHOS 80  --  87 104  --   BILITOT 5.2*  --  3.1* 2.6*  --   PROT 5.0*  --  4.8* 5.0*  --   ALBUMIN 1.9*  < > 1.9* 1.9* 1.8*  < > = values in this interval not displayed. No results for input(s): LIPASE, AMYLASE in the last 168 hours.  Recent Labs Lab 01/22/15 1743  AMMONIA 33   CBC:  Recent Labs Lab 01/18/15 0445  01/19/15 0436 01/20/15 0350 01/21/15 0312 01/22/15 0411 01/23/15 0715  WBC 11.5* 12.3* 13.3* 12.3* 8.8 5.2  NEUTROABS 9.4* 10.5*  --  9.1*  --   --   HGB 10.9* 10.6* 9.8* 9.6* 8.8* 8.1*  HCT 31.4* 30.6* 28.2* 26.9* 24.5* 23.4*  MCV 89.5 87.7 89.8 87.3 86.6 87.3  PLT 146* 208 120* 145* 120* 158   Cardiac Enzymes: No results for input(s): CKTOTAL, CKMB, CKMBINDEX, TROPONINI in the last 168 hours. CBG:  Recent Labs Lab 01/21/15 1622 01/21/15 2250 01/22/15 0611 01/22/15 1646 01/23/15 0856  GLUCAP 99 106* 120* 102* 78    Iron Studies: No results for input(s): IRON, TIBC, TRANSFERRIN, FERRITIN in the last 72 hours. Studies/Results: Dg Chest Port 1 View  01/22/2015   CLINICAL DATA:  Altered mental status and fever evaluate for pneumonia.  EXAM: PORTABLE CHEST - 1 VIEW  COMPARISON:  01/18/2015  FINDINGS: There is a left IJ central line, tip at the  upper SVC. Right IJ central line has been removed.  Stable cardiopericardial enlargement and vascular pedicle widening.  Hypoventilation with indistinct bibasilar opacities. In general, basilar aeration has improved. No edema, effusion or air leak.  IMPRESSION: Continued improvement in lung aeration since extubation but persistent bibasilar opacity. Cannot exclude pneumonia as cause of fever.   Electronically Signed   By: Marnee Spring M.D.   On: 01/22/2015 15:49   . antiseptic oral rinse  7 mL Mouth Rinse QID  . aspirin  81 mg Oral Daily  . chlorhexidine gluconate  15 mL Mouth Rinse BID  . furosemide  80 mg Intravenous BID  . heparin flush  20 Units Intracatheter Q12H  . multivitamin with minerals  1 tablet Oral Daily  . pantoprazole  40 mg Oral QHS  . sodium chloride  10 mL Intravenous Q12H  . vancomycin  1,000 mg Intravenous Q M,W,F-HD    BMET    Component Value Date/Time   NA 129* 01/23/2015 0716   K 3.6 01/23/2015 0716   CL 97* 01/23/2015 0716   CO2 18* 01/23/2015 0716   GLUCOSE 78 01/23/2015 0716   BUN 114* 01/23/2015 0716    CREATININE 14.49* 01/23/2015 0716   CREATININE 1.15 12/17/2014 1707   CALCIUM 8.2* 01/23/2015 0716   GFRNONAA 3* 01/23/2015 0716   GFRNONAA 68 12/17/2014 1707   GFRAA 4* 01/23/2015 0716   GFRAA 79 12/17/2014 1707   CBC    Component Value Date/Time   WBC 5.2 01/23/2015 0715   RBC 2.68* 01/23/2015 0715   HGB 8.1* 01/23/2015 0715   HCT 23.4* 01/23/2015 0715   PLT 158 01/23/2015 0715   MCV 87.3 01/23/2015 0715   MCH 30.2 01/23/2015 0715   MCHC 34.6 01/23/2015 0715   RDW 15.4 01/23/2015 0715   LYMPHSABS 1.0 01/21/2015 0312   MONOABS 1.1* 01/21/2015 0312   EOSABS 1.0* 01/21/2015 0312   BASOSABS 0.1 01/21/2015 0312     Assessment/Plan:  1. AoCKD, oliguric presumably due to septic shock related ATN 1. CVVHD stopped 01/14/15 and transitioned to intermittent HD 01/16/15.   2. No evidence of recovered GFR, remains iHD dependent 3. Next HD 9/2, using temp HD cath 4. Once afebrile and not clear infection, need TDC 5. Hold lasix now, not responsive 2. SIRS- possible PE (RA and RV dilated, RV hypocontractile on ECHO). CT angio on hold for now (concerns for renal recovery as well as IV access).plan is long term anticoagulation without CT angio Currently off of pressors and excubated. 3. CAD- cath revealed occluded OM, cardiology following 4. Shock liver- improved.  5. S/p exploratory lap and fascia closure now s/p wound vac - with some bleeding, improved after holding anticoag, now back on anticoag.  6. Metabolic acidosis 7. Hyponatremia.  8. Thrombocytopenia- hit panel neg. Likely 2/2 to shocked liver. Now improved. Back on heparin - no bleeding currently.  9. Hypoalbuminemia- per primary svc 10. Nutrition: off TPN, taking some PO 11. Persistent Fevers: 8/30: temp HD cath removed.  Started Vanc.  B Cx x2 NGTD   Rian Koon B

## 2015-01-23 NOTE — Discharge Instructions (Signed)
[facility]      [facility] [phone]  OPEN ABDOMINAL SURGERY: POST OP INSTRUCTIONS  Always review your discharge instruction sheet given to you by the facility where your surgery was performed.  IF YOU HAVE DISABILITY OR FAMILY LEAVE FORMS, YOU MUST BRING THEM TO THE OFFICE FOR PROCESSING.  PLEASE DO NOT GIVE THEM TO YOUR DOCTOR.  1. A prescription for pain medication may be given to you upon discharge.  Take your pain medication as prescribed, if needed.  If narcotic pain medicine is not needed, then you may take acetaminophen (Tylenol) or ibuprofen (Advil) as needed. 2. Take your usually prescribed medications unless otherwise directed. 3. If you need a refill on your pain medication, please contact your pharmacy. They will contact our office to request authorization.  Prescriptions will not be filled after 5pm or on week-ends. 4. You should follow a light diet the first few days after arrival home, such as soup and crackers, pudding, etc.unless your doctor has advised otherwise. A high-fiber, low fat diet can be resumed as tolerated.   Be sure to include lots of fluids daily. Most patients will experience some swelling and bruising on the chest and neck area.  Ice packs will help.  Swelling and bruising can take several days to resolve 5. Most patients will experience some swelling and bruising in the area of the incision. Ice pack will help. Swelling and bruising can take several days to resolve..  6. It is common to experience some constipation if taking pain medication after surgery.  Increasing fluid intake and taking a stool softener will usually help or prevent this problem from occurring.  A mild laxative (Milk of Magnesia or Miralax) should be taken according to package directions if there are no bowel movements after 48 hours. 7.  You may have steri-strips (small skin tapes) in place directly over the incision.  These strips should be left on the skin for 7-10 days.  If your  surgeon used skin glue on the incision, you may shower in 24 hours.  The glue will flake off over the next 2-3 weeks.  Any sutures or staples will be removed at the office during your follow-up visit. You may find that a light gauze bandage over your incision may keep your staples from being rubbed or pulled. You may shower and replace the bandage daily. 8. ACTIVITIES:  You may resume regular (light) daily activities beginning the next day--such as daily self-care, walking, climbing stairs--gradually increasing activities as tolerated.  You may have sexual intercourse when it is comfortable.  Refrain from any heavy lifting or straining until approved by your doctor. a. You may drive when you no longer are taking prescription pain medication, you can comfortably wear a seatbelt, and you can safely maneuver your car and apply brakes b. Return to Work: ___________________________________ 9. You should see your doctor in the office for a follow-up appointment approximately two weeks after your surgery.  Make sure that you call for this appointment within a day or two after you arrive home to insure a convenient appointment time. OTHER INSTRUCTIONS:  _____________________________________________________________ _____________________________________________________________  WHEN TO CALL YOUR DOCTOR: 1. Fever over 101.0 2. Inability to urinate 3. Nausea and/or vomiting 4. Extreme swelling or bruising 5. Continued bleeding from incision. 6. Increased pain, redness, or drainage from the incision. 7. Difficulty swallowing or breathing 8. Muscle cramping or spasms. 9. Numbness or tingling in hands or feet or around lips.  The clinic staff is available to   answer your questions during regular business hours.  Please don't hesitate to call and ask to speak to one of the nurses if you have concerns.  For further questions, please visit [email or web address]   

## 2015-01-23 NOTE — Progress Notes (Signed)
[place] TEAM 1 - Stepdown/ICU TEAM Progress Note  HAZE FERRUFINO WIO:[id number] DOB: 04-03-1956 DOA: 01/08/2015 PCP: Nadean Corwin, MD  Admit HPI / Brief Narrative: 59 year old M Hx Depression, Anxiety, HTN, HLD, OSA, and Chronic Back Pain who presented 8/18 after being found down at home. Responsive to narcan via EMS. In ED was profoundly hypotensive with elevated WBC, refractory to volume. In ER noted to have lactate 8.5, hyperkalemia and started on pressors. PCCM admitted the pt.    Significant Events: 8/18 Admit - CCS, Cardiology consulted; brief PEA arrest; exploratory laparotomy with placement of wound vac; started heparin gtt for presumed PE 8/20 CRRT  8/21 OR for abd wound closure  8/22 Anuric. On CRRT. Off pressors. Improved Shock Liver. On fent gtt and heparing gtt. 40% fio2 8/23 ileus 8/23 Cardiac catheterization left circumflex, second obtuse marginal branch,Ost 2d Mrg-2d Mrg lesion or percent stenosed 8/25 HIT Ab screen normal - TNA initiated  8/26 Bleeding around abd wound - surface bled and IV heparin held   HPI/Subjective: The patient is alert but remains quite confused.  He cannot accurately tell me where he is or why he is here.  He yells out randomly during my visit and during my time on the nursing unit.  There is no family present at time of my exam today.   Assessment/Plan:  Sepsis with presumed abdominal source > exp lap unrevealing  Vanc stopped 8/23 - Zosyn stopped 8/26 - temp up to 101 8/30 - Vanc resumed 8/30 - HD catheter removed 8/30 - new temp cath placed 8/31 - WBC has now normalized the patient is afebrile  Clinically diagnosed PE RA and RV dliated w/ R heart failure on TTE - no CTa due to renal failure - long term anticoag to continue - venous duplex w/o evidence of DVT   CAD Cardiac cath has noted an occluded OM - Cardiology following   Acute abdominal pain s/p laparotomy -wound VAC now discontinued -nectar thick diet as per SLP exam    Altered mental status  -Appears to be multifactorial - BUN of 114 certainly contributing - mental status appears stable to my exam though not normal  ATN with metab acidosis and hyperkalemia in CKD  - renal US neg  - CRRT started per renal 8/20 - stopped 01/15/15  - On HD since 01/16/15 - RF and pan autoimmune negative 01/14/15 - Nephrology following - will need eventual tunneled HD cath once infection risk/status more clear  - baseline creatinine 1.14 from 12/17/14  OSA -CPAP per respiratory  Anemia/thrombocytopenia - Most likely multifactorial to include acute illness, recent surgery, and poor diet - HIT panel negative.  Shock liver -LFTs slowly improving   A- Fib -Currently in NSR - off amio since 01/11/15  Systolic CHF/RV dilation -volume management per HD   Pulmonary hypertension  Hyperglycemia  -insulin while on TNA as needed   Code Status: FULL Family Communication: spoke w/ wife at bedside  Disposition Plan: SDU  Consultants: PCCM Gen Surgery   Procedure/Significant Events: 8/18 Echo >> EF 45 to 50%, mod RV dilation, mod/severe RV dysfx, PAS 47 mmHg 8/18 Renal u/s >> b/l renal echogenicity  DVT prophylaxis: Heparin drip  Objective: Blood pressure 133/64, pulse 79, temperature 98 F (36.7 C), temperature source Oral, resp. rate 17, weight 130.8 kg (288 lb 5.8 oz), SpO2 97 %.  Intake/Output Summary (Last 24 hours) at 01/23/15 1337 Last data filed at 01/23/15 0600  Gross per 24 hour  Intake  286 ml  Output    250 ml  Net     36 ml   Exam: General: No acute respiratory distress - alert but confused  Lungs: Clear to auscultation bilaterally - distant bs Cardiovascular: Regular rate and rhythm without murmur gallop or rub  Abdomen: Large midline wound dressed and dry, no appreciable bowel sounds, soft Extremities: No significant cyanosis or clubbing  Data Reviewed: Basic Metabolic Panel:  Recent Labs Lab 01/18/15 0445 01/19/15 0436  01/20/15 0538 01/21/15 0312 01/22/15 0411 01/23/15 0715 01/23/15 0716  NA 134* 132* 129* 129* 130*  --  129*  K 4.6 4.4 4.3 4.2 3.2*  --  3.6  CL 103 99* 97* 97* 96*  --  97*  CO2 19* 17* 17* 15* 23  --  18*  GLUCOSE 124* 97 107* 103* 116*  --  78  BUN 91* 110* 112* 131* 80*  --  114*  CREATININE 11.19* 13.12* 13.86* 15.16* 9.70*  --  14.49*  CALCIUM 8.2* 8.4* 8.0* 8.1* 7.9*  --  8.2*  MG 2.4 2.3 2.2 2.2 1.8 2.0  --   PHOS 6.1* 7.0* 7.6*  --  4.7*  --  7.0*     Liver Function Tests:  Recent Labs Lab 01/19/15 0436 01/20/15 0538 01/21/15 0312 01/22/15 0411 01/23/15 0716  AST 44*  --  30 33  --   ALT 183*  --  115* 99*  --   ALKPHOS 80  --  87 104  --   BILITOT 5.2*  --  3.1* 2.6*  --   PROT 5.0*  --  4.8* 5.0*  --   ALBUMIN 1.9* 1.9* 1.9* 1.9* 1.8*    CBC:  Recent Labs Lab 01/17/15 0400 01/18/15 0445 01/19/15 0436 01/20/15 0350 01/21/15 0312 01/22/15 0411 01/23/15 0715  WBC 10.5 11.5* 12.3* 13.3* 12.3* 8.8 5.2  NEUTROABS 7.8* 9.4* 10.5*  --  9.1*  --   --   HGB 11.4* 10.9* 10.6* 9.8* 9.6* 8.8* 8.1*  HCT 33.0* 31.4* 30.6* 28.2* 26.9* 24.5* 23.4*  MCV 86.8 89.5 87.7 89.8 87.3 86.6 87.3  PLT 122* 146* 208 120* 145* 120* 158   Cardiac Enzymes: No results for input(s): CKTOTAL, CKMB, CKMBINDEX, TROPONINI in the last 168 hours.   CBG:  Recent Labs Lab 01/21/15 1622 01/21/15 2250 01/22/15 0611 01/22/15 1646 01/23/15 0856  GLUCAP 99 106* 120* 102* 78    Recent Results (from the past 240 hour(s))  Culture, blood (routine x 2)     Status: None (Preliminary result)   Collection Time: 01/20/15  3:48 PM  Result Value Ref Range Status   Specimen Description BLOOD PORTA CATH  Final   Special Requests BOTTLES DRAWN AEROBIC AND ANAEROBIC  Final   Culture NO GROWTH 2 DAYS  Final   Report Status PENDING  Incomplete  Culture, blood (routine x 2)     Status: None (Preliminary result)   Collection Time: 01/20/15  4:25 PM  Result Value Ref Range Status    Specimen Description BLOOD PORTA CATH  Final   Special Requests BOTTLES DRAWN AEROBIC AND ANAEROBIC  Final   Culture NO GROWTH 2 DAYS  Final   Report Status PENDING  Incomplete  Culture, Urine     Status: None (Preliminary result)   Collection Time: 01/22/15  6:03 PM  Result Value Ref Range Status   Specimen Description URINE, CATHETERIZED  Final   Special Requests NONE  Final   Culture NO GROWTH < 24 HOURS  Final   Report Status PENDING  Incomplete     Studies:  Recent x-ray studies have been reviewed in detail by the Attending Physician  Scheduled Meds:  Scheduled Meds: . antiseptic oral rinse  7 mL Mouth Rinse QID  . aspirin  81 mg Oral Daily  . chlorhexidine gluconate  15 mL Mouth Rinse BID  . heparin flush  20 Units Intracatheter Q12H  . multivitamin with minerals  1 tablet Oral Daily  . pantoprazole  40 mg Oral QHS  . sodium chloride  10 mL Intravenous Q12H  . vancomycin  1,000 mg Intravenous Q M,W,F-HD    Time spent on care of this patient: 35 mins  Lonia Blood, MD [facility] For Consults/Admissions - Flow Manager - [phone] Office  [phone]  Contact MD directly via text page:      [email or web address]      password [facility]  01/23/2015, 1:37 PM   LOS: 15 days

## 2015-01-23 NOTE — Progress Notes (Signed)
ANTICOAGULATIONCONSULT NOTE - Follow Up Consult  Pharmacy Consult for Heparin   Indication: pulmonary embolus   Allergies  Allergen Reactions  . Morphine And Related Anaphylaxis and Shortness Of Breath  . Montelukast Other (See Comments)    unknown  . Neurontin [Gabapentin] Other (See Comments)    delusional  . Wellbutrin [Bupropion] Palpitations    Insomnia    Patient Measurements: Weight: 288 lb 5.8 oz (130.8 kg)  IBW: 83 kg Heparin Dosing Weight: 111.8 kg  Vital Signs: Temp: 99.2 F (37.3 C) (09/02 1926) Temp Source: Axillary (09/02 1926) BP: 136/82 mmHg (09/02 2040) Pulse Rate: 88 (09/02 2040)  Labs:  Recent Labs  01/21/15 0312  01/22/15 0411 01/22/15 0412 01/23/15 0715 01/23/15 0716 01/23/15 2055  HGB 9.6*  --  8.8*  --  8.1*  --   --   HCT 26.9*  --  24.5*  --  23.4*  --   --   PLT 145*  --  120*  --  158  --   --   HEPARINUNFRC 0.74*  < >  --  0.52 0.89*  --  0.86*  CREATININE 15.16*  --  9.70*  --   --  14.49*  --   < > = values in this interval not displayed.  Estimated Creatinine Clearance: 7.8 mL/min (by C-G formula based on Cr of 14.49).   Medications:  Heparin @ 2400 units/hr (24 ml/hr)  Assessment: 59 YOM with RV failure and presumed PE on heparin for anticoagulation. Heparin level is elevated (HL= 0.86) after decrease to 2400 units/hr.  Goal of Therapy:  Heparin level 0.3-0.7 units/ml Monitor platelets by anticoagulation protocol: Yes   Plan:  -Decrease heparin to 2150 units/hr -Heparin level in 8 hours and daily wth CBC daily  Harland German, Pharm D 01/23/2015 9:40 PM

## 2015-01-23 NOTE — Care Management Important Message (Signed)
Important Message  Patient Details  Name: Jared [name] MRN: [id number] Date of Birth: 1956-03-18   Medicare Important Message Given:  Yes-third notification given    Yvonna Alanis 01/23/2015, 12:26 PM

## 2015-01-23 NOTE — Progress Notes (Addendum)
ANTICOAGULATION & ANTIBIOTIC CONSULT NOTE - Follow Up Consult  Pharmacy Consult for Heparin & Vancomycin  Indication: pulmonary embolus & concern for HD cath infection   Allergies  Allergen Reactions  . Morphine And Related Anaphylaxis and Shortness Of Breath  . Montelukast Other (See Comments)    unknown  . Neurontin [Gabapentin] Other (See Comments)    delusional  . Wellbutrin [Bupropion] Palpitations    Insomnia    Patient Measurements: Weight: 288 lb 5.8 oz (130.8 kg)  IBW: 83 kg Heparin Dosing Weight: 111.8 kg  Vital Signs: Temp: 99 F (37.2 C) (09/02 0857) Temp Source: Oral (09/02 0857) BP: 124/54 mmHg (09/02 0857) Pulse Rate: 78 (09/02 0857)  Labs:  Recent Labs  01/21/15 6389  01/21/15 2324 01/22/15 0411 01/22/15 0412 01/23/15 0715 01/23/15 0716  HGB 9.6*  --   --  8.8*  --  8.1*  --   HCT 26.9*  --   --  24.5*  --  23.4*  --   PLT 145*  --   --  120*  --  158  --   HEPARINUNFRC 0.74*  < > >2.20*  --  0.52 0.89*  --   CREATININE 15.16*  --   --  9.70*  --   --  14.49*  < > = values in this interval not displayed.  Estimated Creatinine Clearance: 7.8 mL/min (by C-G formula based on Cr of 14.49).   Medications:  Heparin @ 2600 units/hr (26 ml/hr)  Assessment: 59 YOM with RV failure and presumed PE on heparin for anticoagulation. Heparin level this morning is SUPRAtherapeutic (HL 0.89 << 0.52, goal of 0.3-0.7). Hgb/Hct relatively stable - no overt s/sx of bleeding noted. No issues noted per RN report.   A pre-HD Vancomycin level this afternoon resulted as SUPRAtherapeutic (VR 38 mcg/ml, goal of 15-25 mcg/ml). The HD dose was not charted as given on 8/31 and so an extra dose was given on 9/1. The patient was also noted to be on Vanc previously this admission and could potentially have some residual added from that. Will plan to hold Vancomycin after today's HD session.   Goal of Therapy:  Heparin level 0.3-0.7 units/ml Monitor platelets by anticoagulation  protocol: Yes   Plan:  1. Decrease Heparin to 2400 units/hr (24 ml/hr) 2. Hold Vancomycin dose after HD today 3. Resume Vancomycin 1g/HD-MWF starting on Monday, 9/5 2. Will continue to monitor for any signs/symptoms of bleeding and will follow up with heparin level in 8 hours   Georgina Pillion, PharmD, BCPS Clinical Pharmacist Pager: [phone] 01/23/2015 9:06 AM

## 2015-01-23 NOTE — Progress Notes (Signed)
Patient ID: Jared [name], male   DOB: [birth year], 59 y.o.   MRN: [id number] 10 Days Post-Op  Subjective: Pt still confused but alert.  He does know his DOB and name today, but doesn't know year or where he is.  Taking some full liquids.  Objective: Vital signs in last 24 hours: Temp:  [98.2 F (36.8 C)-101.4 F (38.6 C)] 98.2 F (36.8 C) (09/02 0300) Pulse Rate:  [84-95] 84 (09/01 1500) Resp:  [12-20] 15 (09/02 0300) BP: (96-159)/(27-90) 146/71 mmHg (09/02 0300) SpO2:  [92 %-99 %] 97 % (09/02 0300) Weight:  [130.8 kg (288 lb 5.8 oz)] 130.8 kg (288 lb 5.8 oz) (09/02 0500) Last BM Date: 01/22/15  Intake/Output from previous day: 09/01 0701 - 09/02 0700 In: 286 [I.V.:286] Out: 250 [Urine:250] Intake/Output this shift:    PE: Abd: soft, appropriately tender, wound is clean and packed, +BS, ND  Lab Results:   Recent Labs  01/21/15 0312 01/22/15 0411  WBC 12.3* 8.8  HGB 9.6* 8.8*  HCT 26.9* 24.5*  PLT 145* 120*   BMET  Recent Labs  01/21/15 0312 01/22/15 0411  NA 129* 130*  K 4.2 3.2*  CL 97* 96*  CO2 15* 23  GLUCOSE 103* 116*  BUN 131* 80*  CREATININE 15.16* 9.70*  CALCIUM 8.1* 7.9*   PT/INR No results for input(s): LABPROT, INR in the last 72 hours. CMP     Component Value Date/Time   NA 130* 01/22/2015 0411   K 3.2* 01/22/2015 0411   CL 96* 01/22/2015 0411   CO2 23 01/22/2015 0411   GLUCOSE 116* 01/22/2015 0411   BUN 80* 01/22/2015 0411   CREATININE 9.70* 01/22/2015 0411   CREATININE 1.15 12/17/2014 1707   CALCIUM 7.9* 01/22/2015 0411   PROT 5.0* 01/22/2015 0411   ALBUMIN 1.9* 01/22/2015 0411   AST 33 01/22/2015 0411   ALT 99* 01/22/2015 0411   ALKPHOS 104 01/22/2015 0411   BILITOT 2.6* 01/22/2015 0411   GFRNONAA 5* 01/22/2015 0411   GFRNONAA 68 12/17/2014 1707   GFRAA 6* 01/22/2015 0411   GFRAA 79 12/17/2014 1707   Lipase     Component Value Date/Time   LIPASE 80* 01/12/2015 0414       Studies/Results: Dg Chest Port 1  View  01/22/2015   CLINICAL DATA:  Altered mental status and fever evaluate for pneumonia.  EXAM: PORTABLE CHEST - 1 VIEW  COMPARISON:  01/18/2015  FINDINGS: There is a left IJ central line, tip at the upper SVC. Right IJ central line has been removed.  Stable cardiopericardial enlargement and vascular pedicle widening.  Hypoventilation with indistinct bibasilar opacities. In general, basilar aeration has improved. No edema, effusion or air leak.  IMPRESSION: Continued improvement in lung aeration since extubation but persistent bibasilar opacity. Cannot exclude pneumonia as cause of fever.   Electronically Signed   By: Marnee Spring M.D.   On: 01/22/2015 15:49    Anti-infectives: Anti-infectives    Start     Dose/Rate Route Frequency Ordered Stop   01/22/15 1000  vancomycin (VANCOCIN) IVPB 1000 mg/200 mL premix     1,000 mg 200 mL/hr over 60 Minutes Intravenous  Once 01/22/15 0903 01/22/15 1302   01/21/15 1200  vancomycin (VANCOCIN) IVPB 1000 mg/200 mL premix     1,000 mg 200 mL/hr over 60 Minutes Intravenous Every M-W-F (Hemodialysis) 01/20/15 1703     01/20/15 1730  vancomycin (VANCOCIN) 2,000 mg in sodium chloride 0.9 % 500 mL IVPB     2,000 mg  250 mL/hr over 120 Minutes Intravenous  Once 01/20/15 1641 01/20/15 1930   01/15/15 2000  piperacillin-tazobactam (ZOSYN) IVPB 2.25 g  Status:  Discontinued     2.25 g 100 mL/hr over 30 Minutes Intravenous 3 times per day 01/15/15 1511 01/17/15 0958   01/11/15 1630  vancomycin (VANCOCIN) 1,250 mg in sodium chloride 0.9 % 250 mL IVPB  Status:  Discontinued     1,250 mg 166.7 mL/hr over 90 Minutes Intravenous Every 24 hours 01/11/15 1029 01/14/15 1150   01/10/15 1300  vancomycin (VANCOCIN) 1,750 mg in sodium chloride 0.9 % 500 mL IVPB  Status:  Discontinued     1,750 mg 250 mL/hr over 120 Minutes Intravenous Every 48 hours 01/08/15 1327 01/09/15 1537   01/10/15 1300  vancomycin (VANCOCIN) 1,250 mg in sodium chloride 0.9 % 250 mL IVPB  Status:   Discontinued     1,250 mg 166.7 mL/hr over 90 Minutes Intravenous Every 24 hours 01/10/15 0947 01/11/15 1029   01/10/15 1200  piperacillin-tazobactam (ZOSYN) IVPB 2.25 g  Status:  Discontinued     2.25 g 100 mL/hr over 30 Minutes Intravenous 4 times per day 01/10/15 0929 01/15/15 1511   01/09/15 1400  piperacillin-tazobactam (ZOSYN) IVPB 2.25 g  Status:  Discontinued     2.25 g 100 mL/hr over 30 Minutes Intravenous 3 times per day 01/09/15 0908 01/10/15 0929   01/09/15 1100  cefTRIAXone (ROCEPHIN) 1 g in dextrose 5 % 50 mL IVPB  Status:  Discontinued     1 g 100 mL/hr over 30 Minutes Intravenous Every 24 hours 01/08/15 1133 01/08/15 1327   01/09/15 1100  azithromycin (ZITHROMAX) 500 mg in dextrose 5 % 250 mL IVPB  Status:  Discontinued     500 mg 250 mL/hr over 60 Minutes Intravenous Every 24 hours 01/08/15 1133 01/08/15 1327   01/08/15 2200  piperacillin-tazobactam (ZOSYN) IVPB 3.375 g  Status:  Discontinued     3.375 g 12.5 mL/hr over 240 Minutes Intravenous 3 times per day 01/08/15 1327 01/09/15 0908   01/08/15 1400  vancomycin (VANCOCIN) 2,500 mg in sodium chloride 0.9 % 500 mL IVPB     2,500 mg 250 mL/hr over 120 Minutes Intravenous  Once 01/08/15 1327 01/08/15 1747   01/08/15 1330  piperacillin-tazobactam (ZOSYN) IVPB 3.375 g  Status:  Discontinued     3.375 g 100 mL/hr over 30 Minutes Intravenous  Once 01/08/15 1316 01/10/15 0929   01/08/15 1130  cefTRIAXone (ROCEPHIN) 1 g in dextrose 5 % 50 mL IVPB     1 g 100 mL/hr over 30 Minutes Intravenous  Once 01/08/15 1126 01/08/15 1220   01/08/15 1130  azithromycin (ZITHROMAX) 500 mg in dextrose 5 % 250 mL IVPB     500 mg 250 mL/hr over 60 Minutes Intravenous  Once 01/08/15 1126 01/08/15 1322       Assessment/Plan   POD 15, 12 s/p ex lap with open abdominal VAC placement and then closure -diet as tolerates -Con't dressing changes BID -original sepsis not secondary to any abdominal source as his ex lap was negative MMP -per  primary service and other specialties DVT prophylaxis -SCDs, heparin drip  Patient is surgically stable.  We will sign off.  He will need to follow up with Dr. Corliss Skains in 3-4 weeks s/p discharge from the hospital   LOS: 15 days    Tyeshia Cornforth E 01/23/2015, 7:28 AM Pager: [phone]

## 2015-01-24 ENCOUNTER — Inpatient Hospital Stay (HOSPITAL_COMMUNITY): Payer: Medicare Other

## 2015-01-24 LAB — CBC
HEMATOCRIT: 23.7 % — AB (ref 39.0–52.0)
Hemoglobin: 8.6 g/dL — ABNORMAL LOW (ref 13.0–17.0)
MCH: 31.5 pg (ref 26.0–34.0)
MCHC: 36.3 g/dL — AB (ref 30.0–36.0)
MCV: 86.8 fL (ref 78.0–100.0)
PLATELETS: 142 10*3/uL — AB (ref 150–400)
RBC: 2.73 MIL/uL — ABNORMAL LOW (ref 4.22–5.81)
RDW: 15.4 % (ref 11.5–15.5)
WBC: 6.6 10*3/uL (ref 4.0–10.5)

## 2015-01-24 LAB — RENAL FUNCTION PANEL
ALBUMIN: 2 g/dL — AB (ref 3.5–5.0)
Anion gap: 12 (ref 5–15)
BUN: 61 mg/dL — AB (ref 6–20)
CHLORIDE: 97 mmol/L — AB (ref 101–111)
CO2: 25 mmol/L (ref 22–32)
CREATININE: 10.09 mg/dL — AB (ref 0.61–1.24)
Calcium: 8 mg/dL — ABNORMAL LOW (ref 8.9–10.3)
GFR calc Af Amer: 6 mL/min — ABNORMAL LOW (ref >60)
GFR, EST NON AFRICAN AMERICAN: 5 mL/min — AB (ref >60)
GLUCOSE: 86 mg/dL (ref 65–99)
POTASSIUM: 3.6 mmol/L (ref 3.5–5.1)
Phosphorus: 4.7 mg/dL — ABNORMAL HIGH (ref 2.5–4.6)
Sodium: 134 mmol/L — ABNORMAL LOW (ref 135–145)

## 2015-01-24 LAB — HEPATIC FUNCTION PANEL
ALT: 84 U/L — ABNORMAL HIGH (ref 17–63)
AST: 54 U/L — ABNORMAL HIGH (ref 15–41)
Albumin: 2 g/dL — ABNORMAL LOW (ref 3.5–5.0)
Alkaline Phosphatase: 148 U/L — ABNORMAL HIGH (ref 38–126)
BILIRUBIN DIRECT: 1.1 mg/dL — AB (ref 0.1–0.5)
BILIRUBIN INDIRECT: 1 mg/dL — AB (ref 0.3–0.9)
BILIRUBIN TOTAL: 2.1 mg/dL — AB (ref 0.3–1.2)
Total Protein: 5.1 g/dL — ABNORMAL LOW (ref 6.5–8.1)

## 2015-01-24 LAB — MAGNESIUM: MAGNESIUM: 2 mg/dL (ref 1.7–2.4)

## 2015-01-24 LAB — GLUCOSE, CAPILLARY: Glucose-Capillary: 97 mg/dL (ref 65–99)

## 2015-01-24 LAB — HEPARIN LEVEL (UNFRACTIONATED)
HEPARIN UNFRACTIONATED: 0.61 [IU]/mL (ref 0.30–0.70)
HEPARIN UNFRACTIONATED: 0.49 [IU]/mL (ref 0.30–0.70)

## 2015-01-24 MED ORDER — PROMETHAZINE HCL 25 MG/ML IJ SOLN
12.5000 mg | Freq: Four times a day (QID) | INTRAMUSCULAR | Status: DC | PRN
  Administered 2015-01-24 – 2015-02-03 (×11): 12.5 mg via INTRAVENOUS
  Filled 2015-01-24 (×11): qty 1

## 2015-01-24 MED ORDER — ONDANSETRON HCL 4 MG/2ML IJ SOLN
4.0000 mg | INTRAMUSCULAR | Status: DC | PRN
  Administered 2015-01-26 – 2015-01-30 (×8): 4 mg via INTRAVENOUS
  Filled 2015-01-24 (×8): qty 2

## 2015-01-24 MED ORDER — HYDROMORPHONE HCL 1 MG/ML IJ SOLN
1.0000 mg | INTRAMUSCULAR | Status: DC | PRN
  Administered 2015-01-24 – 2015-01-25 (×4): 1 mg via INTRAVENOUS
  Filled 2015-01-24: qty 1
  Filled 2015-01-24: qty 2
  Filled 2015-01-24: qty 1

## 2015-01-24 NOTE — Progress Notes (Signed)
Patient ID: Jared [name], male   DOB: [birth year], 59 y.o.   MRN: [id number]  S:  HD yesterday, 4h, 3L UF, tolerated NO new events Afebrile past 24h, BCx NGTD on Vanc No inc in UOP   O:BP 129/74 mmHg  Pulse 85  Temp(Src) 99.1 F (37.3 C) (Oral)  Resp 16  Wt 127.1 kg (280 lb 3.3 oz)  SpO2 92%  Intake/Output Summary (Last 24 hours) at 01/24/15 0716 Last data filed at 01/24/15 0600  Gross per 24 hour  Intake 1112.06 ml  Output   3200 ml  Net -2087.94 ml   Intake/Output: I/O last 3 completed shifts: In: 1398.1 [I.V.:1398.1] Out: 3450 [Urine:450; Other:3000]  Intake/Output this shift:    Weight change: 0.4 kg (14.1 oz) Gen: pleasant male, lying in bed, Heart: RRR, no m/r/g Resp:bibasilar crackles.  Abd:+BS, binder in place, tender to palpation EXT: trace - 1+ edema Neuro: awake today, oriented to self only, tangential   Recent Labs Lab 01/18/15 0445 01/19/15 0436 01/20/15 0538 01/21/15 0312 01/22/15 0411 01/23/15 0716 01/24/15 0445  NA 134* 132* 129* 129* 130* 129* 134*  K 4.6 4.4 4.3 4.2 3.2* 3.6 3.6  CL 103 99* 97* 97* 96* 97* 97*  CO2 19* 17* 17* 15* 23 18* 25  GLUCOSE 124* 97 107* 103* 116* 78 86  BUN 91* 110* 112* 131* 80* 114* 61*  CREATININE 11.19* 13.12* 13.86* 15.16* 9.70* 14.49* 10.09*  ALBUMIN 1.8* 1.9* 1.9* 1.9* 1.9* 1.8* 2.0*  2.0*  CALCIUM 8.2* 8.4* 8.0* 8.1* 7.9* 8.2* 8.0*  PHOS 6.1* 7.0* 7.6*  --  4.7* 7.0* 4.7*  AST  --  44*  --  30 33  --  54*  ALT  --  183*  --  115* 99*  --  84*   Liver Function Tests:  Recent Labs Lab 01/21/15 0312 01/22/15 0411 01/23/15 0716 01/24/15 0445  AST 30 33  --  54*  ALT 115* 99*  --  84*  ALKPHOS 87 104  --  148*  BILITOT 3.1* 2.6*  --  2.1*  PROT 4.8* 5.0*  --  5.1*  ALBUMIN 1.9* 1.9* 1.8* 2.0*  2.0*   No results for input(s): LIPASE, AMYLASE in the last 168 hours.  Recent Labs Lab 01/22/15 1743  AMMONIA 33   CBC:  Recent Labs Lab 01/18/15 0445 01/19/15 0436 01/20/15 0350  01/21/15 0312 01/22/15 0411 01/23/15 0715 01/24/15 0445  WBC 11.5* 12.3* 13.3* 12.3* 8.8 5.2 6.6  NEUTROABS 9.4* 10.5*  --  9.1*  --   --   --   HGB 10.9* 10.6* 9.8* 9.6* 8.8* 8.1* 8.6*  HCT 31.4* 30.6* 28.2* 26.9* 24.5* 23.4* 23.7*  MCV 89.5 87.7 89.8 87.3 86.6 87.3 86.8  PLT 146* 208 120* 145* 120* 158 142*   Cardiac Enzymes: No results for input(s): CKTOTAL, CKMB, CKMBINDEX, TROPONINI in the last 168 hours. CBG:  Recent Labs Lab 01/21/15 2250 01/22/15 0611 01/22/15 1646 01/23/15 0856 01/23/15 1622  GLUCAP 106* 120* 102* 78 76    Iron Studies: No results for input(s): IRON, TIBC, TRANSFERRIN, FERRITIN in the last 72 hours. Studies/Results: Dg Chest Port 1 View  01/22/2015   CLINICAL DATA:  Altered mental status and fever evaluate for pneumonia.  EXAM: PORTABLE CHEST - 1 VIEW  COMPARISON:  01/18/2015  FINDINGS: There is a left IJ central line, tip at the upper SVC. Right IJ central line has been removed.  Stable cardiopericardial enlargement and vascular pedicle widening.  Hypoventilation with indistinct bibasilar  opacities. In general, basilar aeration has improved. No edema, effusion or air leak.  IMPRESSION: Continued improvement in lung aeration since extubation but persistent bibasilar opacity. Cannot exclude pneumonia as cause of fever.   Electronically Signed   By: Marnee Spring M.D.   On: 01/22/2015 15:49   . antiseptic oral rinse  7 mL Mouth Rinse QID  . aspirin  81 mg Oral Daily  . chlorhexidine gluconate  15 mL Mouth Rinse BID  . heparin flush  20 Units Intracatheter Q12H  . multivitamin with minerals  1 tablet Oral Daily  . pantoprazole  40 mg Oral QHS  . sodium chloride  10 mL Intravenous Q12H  . vancomycin  1,000 mg Intravenous Q M,W,F-HD    BMET    Component Value Date/Time   NA 134* 01/24/2015 0445   K 3.6 01/24/2015 0445   CL 97* 01/24/2015 0445   CO2 25 01/24/2015 0445   GLUCOSE 86 01/24/2015 0445   BUN 61* 01/24/2015 0445   CREATININE 10.09*  01/24/2015 0445   CREATININE 1.15 12/17/2014 1707   CALCIUM 8.0* 01/24/2015 0445   GFRNONAA 5* 01/24/2015 0445   GFRNONAA 68 12/17/2014 1707   GFRAA 6* 01/24/2015 0445   GFRAA 79 12/17/2014 1707   CBC    Component Value Date/Time   WBC 6.6 01/24/2015 0445   RBC 2.73* 01/24/2015 0445   HGB 8.6* 01/24/2015 0445   HCT 23.7* 01/24/2015 0445   PLT 142* 01/24/2015 0445   MCV 86.8 01/24/2015 0445   MCH 31.5 01/24/2015 0445   MCHC 36.3* 01/24/2015 0445   RDW 15.4 01/24/2015 0445   LYMPHSABS 1.0 01/21/2015 0312   MONOABS 1.1* 01/21/2015 0312   EOSABS 1.0* 01/21/2015 0312   BASOSABS 0.1 01/21/2015 0312     Assessment/Plan:  1. AoCKD, oliguric presumably due to septic shock related ATN 1. CVVHD stopped 01/14/15 and transitioned to intermittent HD 01/16/15.   2. No evidence of recovered GFR, remains iHD dependent 3. Next HD tentatively 9/5, using temp HD cath 4. If remains afebrile over weekend will plan for transition to [facility] on 9/5 5. Hold lasix now, not responsive 2. SIRS- possible PE (RA and RV dilated, RV hypocontractile on ECHO). CT angio on hold for now (concerns for renal recovery as well as IV access).plan is long term anticoagulation without CT angio Currently off of pressors and excubated. 3. CAD- cath revealed occluded OM, cardiology following 4. Shock liver- improved.  5. S/p exploratory lap and fascia closure now s/p wound vac - with some bleeding, improved after holding anticoag, now back on anticoag.  6. Metabolic acidosis 7. Hyponatremia.  8. Thrombocytopenia- hit panel neg. Likely 2/2 to shocked liver. Now improved. Back on heparin - no bleeding currently.  9. Hypoalbuminemia- per primary svc 10. Nutrition: off TPN, taking some PO 11. Persistent Fevers: 8/30: temp HD cath removed.  Started Vanc.  B Cx x2 NGTD   Clementine Soulliere B

## 2015-01-24 NOTE — Progress Notes (Addendum)
ANTICOAGULATION CONSULT NOTE - Follow Up Consult  Pharmacy Consult for Heparin Indication: pulmonary embolus   Allergies  Allergen Reactions  . Morphine And Related Anaphylaxis and Shortness Of Breath  . Montelukast Other (See Comments)    unknown  . Neurontin [Gabapentin] Other (See Comments)    delusional  . Wellbutrin [Bupropion] Palpitations    Insomnia    Patient Measurements: Weight: 280 lb 3.3 oz (127.1 kg)  IBW: 83 kg Heparin Dosing Weight: 111.8 kg  Vital Signs: Temp: 99.1 F (37.3 C) (09/03 0740) Temp Source: Oral (09/03 0740) BP: 132/82 mmHg (09/03 0740) Pulse Rate: 83 (09/03 0740)  Labs:  Recent Labs  01/22/15 0411  01/23/15 0715 01/23/15 0716 01/23/15 2055 01/24/15 0445  HGB 8.8*  --  8.1*  --   --  8.6*  HCT 24.5*  --  23.4*  --   --  23.7*  PLT 120*  --  158  --   --  142*  HEPARINUNFRC  --   < > 0.89*  --  0.86* 0.61  CREATININE 9.70*  --   --  14.49*  --  10.09*  < > = values in this interval not displayed.  Estimated Creatinine Clearance: 11 mL/min (by C-G formula based on Cr of 10.09).   Medications:  Heparin @ 2150 units/hr (21.5 ml/hr)  Assessment: 59 YOM with RV failure and presumed PE on heparin for anticoagulation. Heparin level this morning is therapeutic after two recent rate reductions (HL 0.61 << 0.86, goal of 0.3-0.7). CBC stable - no overt s/sx of bleeding noted.   Goal of Therapy:  Heparin level 0.3-0.7 units/ml Monitor platelets by anticoagulation protocol: Yes   Plan:  1. Continue heparin at the current rate of 2150 units/hr (21.5 ml/hr) 2. Will continue to monitor for any signs/symptoms of bleeding and will follow up with heparin level in 8 hours to confirm therapeutic   Georgina Pillion, PharmD, BCPS Clinical Pharmacist Pager: [phone] 01/24/2015 8:17 AM    ------------------------------------------------------------------------------------------------------------------------- Addendum:   Heparin level this  afternoon to confirm remains therapeutic (HL 0.49 << 0.62, goal of 0.3-0.7). Some bleeding around the HD catheter site noted - required a dressing change. Will watch.  Plan 1. Continue Heparin at 2150 units/hr (21.5 ml/hr) 2. Will continue to monitor for any signs/symptoms of bleeding and will follow up with heparin level in the a.m.   Georgina Pillion, PharmD, BCPS Clinical Pharmacist Pager: [phone] 01/24/2015 3:09 PM

## 2015-01-24 NOTE — Progress Notes (Signed)
Pt took CPAP mask off within of couple minutes of RT placing mask on. RN placed pt on [place]. No distress noted at this time.

## 2015-01-24 NOTE — Progress Notes (Signed)
Pt had large projectile vomiting approx 500 to 700 cc estimate of yellow gold liquid. Text page to Dr Sharon Seller to make aware of vomiting

## 2015-01-24 NOTE — Progress Notes (Signed)
Informed pt of MD order for NGT . Pt flat out refused told me to get the [facility] out of his room. Told pt why it was ordered Given phenergran earlier says he has some relief from medicine

## 2015-01-24 NOTE — Progress Notes (Signed)
[place] TEAM 1 - Stepdown/ICU TEAM Progress Note  Jared [name] MVH:[id number] DOB: Jun 17, 1955 DOA: 01/08/2015 PCP: Nadean Corwin, MD  Admit HPI / Brief Narrative: 59 year old M Hx Depression, Anxiety, HTN, HLD, OSA, and Chronic Back Pain who presented 8/18 after being found down at home. Responsive to narcan via EMS. In ED was profoundly hypotensive with elevated WBC, refractory to volume. In ER noted to have lactate 8.5, hyperkalemia and started on pressors. PCCM admitted the pt.    Significant Events: 8/18 Admit - CCS, Cardiology consulted; brief PEA arrest; exploratory laparotomy with placement of wound vac; started heparin gtt for presumed PE 8/20 CRRT  8/21 OR for abd wound closure  8/22 Anuric. On CRRT. Off pressors. Improved Shock Liver. On fent gtt and heparing gtt. 40% fio2 8/23 ileus 8/23 Cardiac catheterization left circumflex, second obtuse marginal branch,Ost 2d Mrg-2d Mrg lesion or percent stenosed 8/25 HIT Ab screen normal - TNA initiated  8/26 Bleeding around abd wound - surface bled and IV heparin held   HPI/Subjective: The patient is much more calm today.  He can answer some directed questions.  He remains somewhat somnolent.  He states he "just feels bad in general" but denies any specific complaints.  The nurse reports that he is oozing a very small amount of blood from his femoral hemodialysis catheter.   Assessment/Plan:  Sepsis with presumed abdominal source > exp lap unrevealing  Vanc stopped 8/23 - Zosyn stopped 8/26 - temp up to 101 8/30 - Vanc resumed 8/30 - HD catheter removed 8/30 - new temp cath placed 8/31 right femoral - WBC has normalized - the patient is afebrile  Clinically diagnosed PE RA and RV dliated w/ R heart failure on TTE - no CTa due to renal failure - long term anticoag to continue - venous duplex w/o evidence of DVT - follow hemoglobin with slight oozing of blood at femoral catheter site  CAD Cardiac cath noted an occluded OM -  Cardiology following   Acute abdominal pain s/p laparotomy -wound VAC now discontinued - continue with wound care per general surgery recommendations -nectar thick diet as per SLP exam   Altered mental status  -Appears to be multifactorial - BUN of 114 certainly contributing - mental status is slowly improving  ATN with metab acidosis and hyperkalemia in CKD  - renal US neg  - CRRT started per renal 8/20 - stopped 01/15/15  - On HD since 01/16/15 - RF and pan autoimmune negative 01/14/15 - Nephrology following - will need eventual tunneled HD cath once infection risk/status more clear (hopefully Tuesday) - baseline creatinine 1.14 from 12/17/14  OSA -CPAP per respiratory  Anemia/thrombocytopenia - Most likely multifactorial to include acute illness, recent surgery, and poor diet - HIT panel negative  Shock liver -LFTs slowly improving   A- Fib -Currently in NSR - off amio since 01/11/15  Systolic CHF/RV dilation -volume management per HD   Pulmonary hypertension  Hyperglycemia  -CBG currently normal   Code Status: FULL Family Communication: No family present at time of exam today  Disposition Plan: SDU  Consultants: PCCM Gen Surgery  [facility] Cardiology   Procedure/Significant Events: 8/18 Echo - EF 45 to 50%, mod RV dilation, mod/severe RV dysfx, PAS 47 mmHg 8/18 Renal u/s - b/l renal echogenicity  DVT prophylaxis: Heparin drip  Objective: Blood pressure 132/82, pulse 83, temperature 99.1 F (37.3 C), temperature source Oral, resp. rate 22, weight 127.1 kg (280 lb 3.3 oz), SpO2 100 %.  Intake/Output  Summary (Last 24 hours) at 01/24/15 1151 Last data filed at 01/24/15 0942  Gross per 24 hour  Intake 1712.06 ml  Output   3400 ml  Net -1687.94 ml   Exam: General: No acute respiratory distress - alert / less confused today Lungs: Clear to auscultation bilaterally - no wheeze  Cardiovascular: Regular rate and rhythm without murmur   Abdomen: Large midline  wound dressed and dry, faint bowel sounds, soft, no rebound  Extremities: No significant cyanosis or clubbing - 1+ B LE edema   Data Reviewed: Basic Metabolic Panel:  Recent Labs Lab 01/19/15 0436 01/20/15 0538 01/21/15 0312 01/22/15 0411 01/23/15 0715 01/23/15 0716 01/24/15 0445  NA 132* 129* 129* 130*  --  129* 134*  K 4.4 4.3 4.2 3.2*  --  3.6 3.6  CL 99* 97* 97* 96*  --  97* 97*  CO2 17* 17* 15* 23  --  18* 25  GLUCOSE 97 107* 103* 116*  --  78 86  BUN 110* 112* 131* 80*  --  114* 61*  CREATININE 13.12* 13.86* 15.16* 9.70*  --  14.49* 10.09*  CALCIUM 8.4* 8.0* 8.1* 7.9*  --  8.2* 8.0*  MG 2.3 2.2 2.2 1.8 2.0  --  2.0  PHOS 7.0* 7.6*  --  4.7*  --  7.0* 4.7*     Liver Function Tests:  Recent Labs Lab 01/19/15 0436 01/20/15 0538 01/21/15 0312 01/22/15 0411 01/23/15 0716 01/24/15 0445  AST 44*  --  30 33  --  54*  ALT 183*  --  115* 99*  --  84*  ALKPHOS 80  --  87 104  --  148*  BILITOT 5.2*  --  3.1* 2.6*  --  2.1*  PROT 5.0*  --  4.8* 5.0*  --  5.1*  ALBUMIN 1.9* 1.9* 1.9* 1.9* 1.8* 2.0*  2.0*    CBC:  Recent Labs Lab 01/18/15 0445 01/19/15 0436 01/20/15 0350 01/21/15 0312 01/22/15 0411 01/23/15 0715 01/24/15 0445  WBC 11.5* 12.3* 13.3* 12.3* 8.8 5.2 6.6  NEUTROABS 9.4* 10.5*  --  9.1*  --   --   --   HGB 10.9* 10.6* 9.8* 9.6* 8.8* 8.1* 8.6*  HCT 31.4* 30.6* 28.2* 26.9* 24.5* 23.4* 23.7*  MCV 89.5 87.7 89.8 87.3 86.6 87.3 86.8  PLT 146* 208 120* 145* 120* 158 142*    CBG:  Recent Labs Lab 01/21/15 2250 01/22/15 0611 01/22/15 1646 01/23/15 0856 01/23/15 1622  GLUCAP 106* 120* 102* 78 76    Recent Results (from the past 240 hour(s))  Culture, blood (routine x 2)     Status: None (Preliminary result)   Collection Time: 01/20/15  3:48 PM  Result Value Ref Range Status   Specimen Description BLOOD PORTA CATH  Final   Special Requests BOTTLES DRAWN AEROBIC AND ANAEROBIC  Final   Culture NO GROWTH 4 DAYS  Final   Report Status  PENDING  Incomplete  Culture, blood (routine x 2)     Status: None (Preliminary result)   Collection Time: 01/20/15  4:25 PM  Result Value Ref Range Status   Specimen Description BLOOD PORTA CATH  Final   Special Requests BOTTLES DRAWN AEROBIC AND ANAEROBIC  Final   Culture NO GROWTH 4 DAYS  Final   Report Status PENDING  Incomplete  Culture, blood (routine x 2)     Status: None (Preliminary result)   Collection Time: 01/22/15  3:25 PM  Result Value Ref Range Status  Specimen Description BLOOD LEFT ARM  Final   Special Requests BOTTLES DRAWN AEROBIC ONLY 5CC  Final   Culture NO GROWTH 2 DAYS  Final   Report Status PENDING  Incomplete  Culture, blood (routine x 2)     Status: None (Preliminary result)   Collection Time: 01/22/15  3:30 PM  Result Value Ref Range Status   Specimen Description BLOOD RIGHT ARM  Final   Special Requests BOTTLES DRAWN AEROBIC ONLY 2CC  Final   Culture NO GROWTH 2 DAYS  Final   Report Status PENDING  Incomplete  Culture, Urine     Status: None   Collection Time: 01/22/15  6:03 PM  Result Value Ref Range Status   Specimen Description URINE, CATHETERIZED  Final   Special Requests NONE  Final   Culture NO GROWTH 1 DAY  Final   Report Status 01/23/2015 FINAL  Final     Studies:  Recent x-ray studies have been reviewed in detail by the Attending Physician  Scheduled Meds:  Scheduled Meds: . antiseptic oral rinse  7 mL Mouth Rinse QID  . aspirin  81 mg Oral Daily  . chlorhexidine gluconate  15 mL Mouth Rinse BID  . heparin flush  20 Units Intracatheter Q12H  . multivitamin with minerals  1 tablet Oral Daily  . pantoprazole  40 mg Oral QHS  . sodium chloride  10 mL Intravenous Q12H  . vancomycin  1,000 mg Intravenous Q M,W,F-HD    Time spent on care of this patient: 25 mins  Lonia Blood, MD [facility] For Consults/Admissions - Flow Manager - [phone] Office  [phone]  Contact MD directly via text page:       [email or web address]      password [facility]  01/24/2015, 11:51 AM   LOS: 16 days

## 2015-01-25 DIAGNOSIS — M549 Dorsalgia, unspecified: Secondary | ICD-10-CM

## 2015-01-25 DIAGNOSIS — G8929 Other chronic pain: Secondary | ICD-10-CM

## 2015-01-25 LAB — CULTURE, BLOOD (ROUTINE X 2)
CULTURE: NO GROWTH
CULTURE: NO GROWTH

## 2015-01-25 LAB — RENAL FUNCTION PANEL
ALBUMIN: 2 g/dL — AB (ref 3.5–5.0)
ANION GAP: 15 (ref 5–15)
BUN: 77 mg/dL — AB (ref 6–20)
CO2: 21 mmol/L — ABNORMAL LOW (ref 22–32)
Calcium: 8 mg/dL — ABNORMAL LOW (ref 8.9–10.3)
Chloride: 98 mmol/L — ABNORMAL LOW (ref 101–111)
Creatinine, Ser: 12.61 mg/dL — ABNORMAL HIGH (ref 0.61–1.24)
GFR, EST AFRICAN AMERICAN: 4 mL/min — AB (ref >60)
GFR, EST NON AFRICAN AMERICAN: 4 mL/min — AB (ref >60)
Glucose, Bld: 80 mg/dL (ref 65–99)
PHOSPHORUS: 6.8 mg/dL — AB (ref 2.5–4.6)
POTASSIUM: 3.7 mmol/L (ref 3.5–5.1)
Sodium: 134 mmol/L — ABNORMAL LOW (ref 135–145)

## 2015-01-25 LAB — CBC
HEMATOCRIT: 23.5 % — AB (ref 39.0–52.0)
HEMOGLOBIN: 8.2 g/dL — AB (ref 13.0–17.0)
MCH: 30.5 pg (ref 26.0–34.0)
MCHC: 34.9 g/dL (ref 30.0–36.0)
MCV: 87.4 fL (ref 78.0–100.0)
Platelets: 170 10*3/uL (ref 150–400)
RBC: 2.69 MIL/uL — AB (ref 4.22–5.81)
RDW: 15.4 % (ref 11.5–15.5)
WBC: 6.9 10*3/uL (ref 4.0–10.5)

## 2015-01-25 LAB — HEPATIC FUNCTION PANEL
ALBUMIN: 2 g/dL — AB (ref 3.5–5.0)
ALK PHOS: 138 U/L — AB (ref 38–126)
ALT: 70 U/L — ABNORMAL HIGH (ref 17–63)
AST: 45 U/L — AB (ref 15–41)
Bilirubin, Direct: 1 mg/dL — ABNORMAL HIGH (ref 0.1–0.5)
Indirect Bilirubin: 1 mg/dL — ABNORMAL HIGH (ref 0.3–0.9)
TOTAL PROTEIN: 5.1 g/dL — AB (ref 6.5–8.1)
Total Bilirubin: 2 mg/dL — ABNORMAL HIGH (ref 0.3–1.2)

## 2015-01-25 LAB — HEPARIN LEVEL (UNFRACTIONATED): Heparin Unfractionated: 0.37 IU/mL (ref 0.30–0.70)

## 2015-01-25 MED ORDER — FUROSEMIDE 10 MG/ML IJ SOLN
160.0000 mg | Freq: Once | INTRAVENOUS | Status: AC
  Administered 2015-01-25: 160 mg via INTRAVENOUS
  Filled 2015-01-25: qty 16

## 2015-01-25 MED ORDER — HYDROMORPHONE HCL 1 MG/ML IJ SOLN
1.0000 mg | INTRAMUSCULAR | Status: DC | PRN
  Administered 2015-01-26: 1 mg via INTRAVENOUS
  Administered 2015-01-26 (×3): 2 mg via INTRAVENOUS
  Administered 2015-01-27: 1 mg via INTRAVENOUS
  Administered 2015-01-27: 2 mg via INTRAVENOUS
  Administered 2015-01-27: 1 mg via INTRAVENOUS
  Administered 2015-01-27: 2 mg via INTRAVENOUS
  Administered 2015-01-28 (×3): 1 mg via INTRAVENOUS
  Administered 2015-01-28: 2 mg via INTRAVENOUS
  Administered 2015-01-28: 1 mg via INTRAVENOUS
  Administered 2015-01-28 – 2015-01-29 (×2): 2 mg via INTRAVENOUS
  Administered 2015-01-29 (×2): 1 mg via INTRAVENOUS
  Administered 2015-01-29 – 2015-01-31 (×6): 2 mg via INTRAVENOUS
  Administered 2015-01-31: 1 mg via INTRAVENOUS
  Administered 2015-01-31 – 2015-02-02 (×10): 2 mg via INTRAVENOUS
  Administered 2015-02-03: 1 mg via INTRAVENOUS
  Filled 2015-01-25: qty 2
  Filled 2015-01-25: qty 1
  Filled 2015-01-25 (×6): qty 2
  Filled 2015-01-25: qty 1
  Filled 2015-01-25 (×4): qty 2
  Filled 2015-01-25: qty 1
  Filled 2015-01-25: qty 2
  Filled 2015-01-25 (×2): qty 1
  Filled 2015-01-25 (×4): qty 2
  Filled 2015-01-25: qty 1
  Filled 2015-01-25 (×4): qty 2
  Filled 2015-01-25 (×3): qty 1
  Filled 2015-01-25: qty 2
  Filled 2015-01-25: qty 1
  Filled 2015-01-25: qty 2
  Filled 2015-01-25: qty 1
  Filled 2015-01-25 (×2): qty 2
  Filled 2015-01-25: qty 1

## 2015-01-25 NOTE — Progress Notes (Signed)
[place] TEAM 1 - Stepdown/ICU TEAM Progress Note  DEEGAN MABEN SAY:[id number] DOB: 1955/05/30 DOA: 01/08/2015 PCP: Nadean Corwin, MD  Admit HPI / Brief Narrative: 59 year old M Hx Depression, Anxiety, HTN, HLD, OSA, and Chronic Back Pain who presented 8/18 after being found down at home. Responsive to narcan via EMS. In ED was profoundly hypotensive with elevated WBC, refractory to volume. In ER noted to have lactate 8.5, hyperkalemia and started on pressors. PCCM admitted the pt.    Significant Events: 8/18 Admit - CCS, Cardiology consulted; brief PEA arrest; exploratory laparotomy with placement of wound vac; started heparin gtt for presumed PE 8/20 CRRT  8/21 OR for abd wound closure  8/22 Anuric. On CRRT. Off pressors. Improved Shock Liver. On fent gtt and heparing gtt. 40% fio2 8/23 ileus 8/23 Cardiac catheterization left circumflex, second obtuse marginal branch,Ost 2d Mrg-2d Mrg lesion or percent stenosed 8/25 HIT Ab screen normal - TNA initiated  8/26 Bleeding around abd wound - surface bled and IV heparin held   HPI/Subjective: The patient experienced projectile vomiting yesterday.  This was reportedly large volume per the RN.  The plan was to place an NG tube for suction but the patient refused this.  A KUB was obtained and did not reveal evidence of an ileus/bowel obstruction.  He remains alert but confused today.  His history is of questionable reliability due to his confusion.   Assessment/Plan:  Sepsis with presumed abdominal source > exp lap unrevealing  Vanc stopped 8/23 - Zosyn stopped 8/26 - temp up to 101 8/30 - Vanc resumed 8/30 - HD catheter removed 8/30 - new temp cath placed 8/31 right femoral - WBC has normalized - the patient is afebrile - follow up blood cultures negative 4 thus far  Clinically diagnosed PE RA and RV dliated w/ R heart failure on TTE - no CTa due to renal failure - long term anticoag to continue - venous duplex w/o evidence of DVT -  follow hemoglobin with slight oozing of blood at femoral catheter site  Projectile vomiting Appears to have been an episodic issue - KUB without evidence of obstruction/ileus - clinical exam reveals bowel sounds present - attempt to advance diet again today  CAD Cardiac cath noted an occluded OM - Cardiology following   Acute abdominal pain s/p laparotomy -wound VAC now discontinued - continue with wound care per general surgery recommendations -nectar thick diet as per SLP exam - asking SLP to re-evaluate swallowing ability   Altered mental status  -Appears to be multifactorial - BUN of 114 certainly contributing - mental status is slowly improving, but appears to have stalled at his current level - complete metabolic evaluation to search for reversible etiologies apart from his uremia  Acute renal failure/ATN with metab acidosis and hyperkalemia in CKD  - renal US neg  - CRRT started per renal 8/20 - stopped 01/15/15  - On HD since 01/16/15 - RF and pan autoimmune negative 01/14/15 - Nephrology following - will need eventual tunneled HD cath once infection risk/status more clear (hopefully Tuesday) - baseline creatinine 1.14 from 12/17/14  OSA -CPAP per respiratory, though patient is frequently refusing  Anemia/thrombocytopenia - Most likely multifactorial to include acute illness, recent surgery, and poor diet - HIT panel negative - platelet count is climbing  Shock liver -LFTs have nearly normalized  A- Fib -Remains in NSR - off amio since 01/11/15  Systolic CHF/RV dilation -volume management per HD   Pulmonary hypertension  Hyperglycemia  -CBG  currently normal   Code Status: FULL Family Communication: No family present at time of exam today  Disposition Plan: SDU  Consultants: PCCM Gen Surgery  [facility] Cardiology   Procedure/Significant Events: 8/18 Echo - EF 45 to 50%, mod RV dilation, mod/severe RV dysfx, PAS 47 mmHg 8/18 Renal u/s - b/l renal  echogenicity  DVT prophylaxis: Heparin drip  Objective: Blood pressure 131/46, pulse 70, temperature 98.3 F (36.8 C), temperature source Oral, resp. rate 15, weight 127.1 kg (280 lb 3.3 oz), SpO2 95 %.  Intake/Output Summary (Last 24 hours) at 01/25/15 1120 Last data filed at 01/25/15 1000  Gross per 24 hour  Intake    830 ml  Output    226 ml  Net    604 ml   Exam: General: No acute respiratory distress - alert  Lungs: Clear to auscultation bilaterally without wheeze  Cardiovascular: Regular rate and rhythm without murmur  or rub Abdomen: Large midline wound dressed and dry, faint bowel sounds, soft, no rebound  Extremities: No significant cyanosis or clubbing - 1+ B LE edema - right femoral catheter insertion site clean and dry at present time  Data Reviewed: Basic Metabolic Panel:  Recent Labs Lab 01/20/15 0538 01/21/15 0312 01/22/15 0411 01/23/15 0715 01/23/15 0716 01/24/15 0445 01/25/15 0550  NA 129* 129* 130*  --  129* 134* 134*  K 4.3 4.2 3.2*  --  3.6 3.6 3.7  CL 97* 97* 96*  --  97* 97* 98*  CO2 17* 15* 23  --  18* 25 21*  GLUCOSE 107* 103* 116*  --  78 86 80  BUN 112* 131* 80*  --  114* 61* 77*  CREATININE 13.86* 15.16* 9.70*  --  14.49* 10.09* 12.61*  CALCIUM 8.0* 8.1* 7.9*  --  8.2* 8.0* 8.0*  MG 2.2 2.2 1.8 2.0  --  2.0  --   PHOS 7.6*  --  4.7*  --  7.0* 4.7* 6.8*     Liver Function Tests:  Recent Labs Lab 01/19/15 0436  01/21/15 0312 01/22/15 0411 01/23/15 0716 01/24/15 0445 01/25/15 0550  AST 44*  --  30 33  --  54* 45*  ALT 183*  --  115* 99*  --  84* 70*  ALKPHOS 80  --  87 104  --  148* 138*  BILITOT 5.2*  --  3.1* 2.6*  --  2.1* 2.0*  PROT 5.0*  --  4.8* 5.0*  --  5.1* 5.1*  ALBUMIN 1.9*  < > 1.9* 1.9* 1.8* 2.0*  2.0* 2.0*  2.0*  < > = values in this interval not displayed.  CBC:  Recent Labs Lab 01/19/15 0436  01/21/15 0312 01/22/15 0411 01/23/15 0715 01/24/15 0445 01/25/15 0550  WBC 12.3*  < > 12.3* 8.8 5.2 6.6 6.9   NEUTROABS 10.5*  --  9.1*  --   --   --   --   HGB 10.6*  < > 9.6* 8.8* 8.1* 8.6* 8.2*  HCT 30.6*  < > 26.9* 24.5* 23.4* 23.7* 23.5*  MCV 87.7  < > 87.3 86.6 87.3 86.8 87.4  PLT 208  < > 145* 120* 158 142* 170  < > = values in this interval not displayed.  CBG:  Recent Labs Lab 01/22/15 0611 01/22/15 1646 01/23/15 0856 01/23/15 1622 01/24/15 1544  GLUCAP 120* 102* 78 76 97    Recent Results (from the past 240 hour(s))  Culture, blood (routine x 2)     Status: None (Preliminary result)  Collection Time: 01/20/15  3:48 PM  Result Value Ref Range Status   Specimen Description BLOOD PORTA CATH  Final   Special Requests BOTTLES DRAWN AEROBIC AND ANAEROBIC  Final   Culture NO GROWTH 4 DAYS  Final   Report Status PENDING  Incomplete  Culture, blood (routine x 2)     Status: None (Preliminary result)   Collection Time: 01/20/15  4:25 PM  Result Value Ref Range Status   Specimen Description BLOOD PORTA CATH  Final   Special Requests BOTTLES DRAWN AEROBIC AND ANAEROBIC  Final   Culture NO GROWTH 4 DAYS  Final   Report Status PENDING  Incomplete  Culture, blood (routine x 2)     Status: None (Preliminary result)   Collection Time: 01/22/15  3:25 PM  Result Value Ref Range Status   Specimen Description BLOOD LEFT ARM  Final   Special Requests BOTTLES DRAWN AEROBIC ONLY 5CC  Final   Culture NO GROWTH 2 DAYS  Final   Report Status PENDING  Incomplete  Culture, blood (routine x 2)     Status: None (Preliminary result)   Collection Time: 01/22/15  3:30 PM  Result Value Ref Range Status   Specimen Description BLOOD RIGHT ARM  Final   Special Requests BOTTLES DRAWN AEROBIC ONLY 2CC  Final   Culture NO GROWTH 2 DAYS  Final   Report Status PENDING  Incomplete  Culture, Urine     Status: None   Collection Time: 01/22/15  6:03 PM  Result Value Ref Range Status   Specimen Description URINE, CATHETERIZED  Final   Special Requests NONE  Final   Culture NO GROWTH 1 DAY  Final    Report Status 01/23/2015 FINAL  Final     Studies:  Recent x-ray studies have been reviewed in detail by the Attending Physician  Scheduled Meds:  Scheduled Meds: . antiseptic oral rinse  7 mL Mouth Rinse QID  . aspirin  81 mg Oral Daily  . chlorhexidine gluconate  15 mL Mouth Rinse BID  . heparin flush  20 Units Intracatheter Q12H  . multivitamin with minerals  1 tablet Oral Daily  . pantoprazole  40 mg Oral QHS  . sodium chloride  10 mL Intravenous Q12H  . vancomycin  1,000 mg Intravenous Q M,W,F-HD    Time spent on care of this patient: 35 mins  Lonia Blood, MD [facility] For Consults/Admissions - Flow Manager - [phone] Office  [phone]  Contact MD directly via text page:      [email or web address]      password [facility]  01/25/2015, 11:20 AM   LOS: 17 days

## 2015-01-25 NOTE — Progress Notes (Signed)
Pt refusing cpap at this time 

## 2015-01-25 NOTE — Progress Notes (Signed)
Patient ID: Jared [name], male   DOB: 1956-03-29, 59 y.o.   MRN: [id number]  S:  No new events Maybe inc UOP? Only small inc in BUN/SCr over past 24h Still unable to fully participate in converstaion -- circumstantial   O:BP 130/69 mmHg  Pulse 71  Temp(Src) 98.1 F (36.7 C) (Oral)  Resp 16  Wt 127.1 kg (280 lb 3.3 oz)  SpO2 96%  Intake/Output Summary (Last 24 hours) at 01/25/15 0803 Last data filed at 01/25/15 0700  Gross per 24 hour  Intake 1209.5 ml  Output    226 ml  Net  983.5 ml   Intake/Output: I/O last 3 completed shifts: In: 2321.6 [P.O.:870; I.V.:1451.6] Out: 3426 [Urine:425; Emesis/NG output:1; Other:3000]  Intake/Output this shift:    Weight change: -4.1 kg (-9 lb 0.6 oz) Gen: pleasant male, lying in bed, Heart: RRR, no m/r/g Resp:bibasilar crackles.  Abd:+BS, binder in place, tender to palpation EXT: trace - 1+ edema Neuro: awake today, oriented to self only, tangential   Recent Labs Lab 01/19/15 0436 01/20/15 0538 01/21/15 0312 01/22/15 0411 01/23/15 0716 01/24/15 0445 01/25/15 0550  NA 132* 129* 129* 130* 129* 134* 134*  K 4.4 4.3 4.2 3.2* 3.6 3.6 3.7  CL 99* 97* 97* 96* 97* 97* 98*  CO2 17* 17* 15* 23 18* 25 21*  GLUCOSE 97 107* 103* 116* 78 86 80  BUN 110* 112* 131* 80* 114* 61* 77*  CREATININE 13.12* 13.86* 15.16* 9.70* 14.49* 10.09* 12.61*  ALBUMIN 1.9* 1.9* 1.9* 1.9* 1.8* 2.0*  2.0* 2.0*  2.0*  CALCIUM 8.4* 8.0* 8.1* 7.9* 8.2* 8.0* 8.0*  PHOS 7.0* 7.6*  --  4.7* 7.0* 4.7* 6.8*  AST 44*  --  30 33  --  54* 45*  ALT 183*  --  115* 99*  --  84* 70*   Liver Function Tests:  Recent Labs Lab 01/22/15 0411 01/23/15 0716 01/24/15 0445 01/25/15 0550  AST 33  --  54* 45*  ALT 99*  --  84* 70*  ALKPHOS 104  --  148* 138*  BILITOT 2.6*  --  2.1* 2.0*  PROT 5.0*  --  5.1* 5.1*  ALBUMIN 1.9* 1.8* 2.0*  2.0* 2.0*  2.0*   No results for input(s): LIPASE, AMYLASE in the last 168 hours.  Recent Labs Lab 01/22/15 1743  AMMONIA 33    CBC:  Recent Labs Lab 01/19/15 0436  01/21/15 0312 01/22/15 0411 01/23/15 0715 01/24/15 0445 01/25/15 0550  WBC 12.3*  < > 12.3* 8.8 5.2 6.6 6.9  NEUTROABS 10.5*  --  9.1*  --   --   --   --   HGB 10.6*  < > 9.6* 8.8* 8.1* 8.6* 8.2*  HCT 30.6*  < > 26.9* 24.5* 23.4* 23.7* 23.5*  MCV 87.7  < > 87.3 86.6 87.3 86.8 87.4  PLT 208  < > 145* 120* 158 142* 170  < > = values in this interval not displayed. Cardiac Enzymes: No results for input(s): CKTOTAL, CKMB, CKMBINDEX, TROPONINI in the last 168 hours. CBG:  Recent Labs Lab 01/22/15 0611 01/22/15 1646 01/23/15 0856 01/23/15 1622 01/24/15 1544  GLUCAP 120* 102* 78 76 97    Iron Studies: No results for input(s): IRON, TIBC, TRANSFERRIN, FERRITIN in the last 72 hours. Studies/Results: Dg Abd Portable 1v  01/24/2015   CLINICAL DATA:  Projectile vomiting.  EXAM: PORTABLE ABDOMEN - 1 VIEW  COMPARISON:  01/13/2015.  FINDINGS: Normal bowel gas pattern. Lumbosacral laminectomy defects and fixation hardware.  IMPRESSION: No acute abnormality.   Electronically Signed   By: Beckie Salts M.D.   On: 01/24/2015 19:47   . antiseptic oral rinse  7 mL Mouth Rinse QID  . aspirin  81 mg Oral Daily  . chlorhexidine gluconate  15 mL Mouth Rinse BID  . furosemide  160 mg Intravenous Once  . heparin flush  20 Units Intracatheter Q12H  . multivitamin with minerals  1 tablet Oral Daily  . pantoprazole  40 mg Oral QHS  . sodium chloride  10 mL Intravenous Q12H  . vancomycin  1,000 mg Intravenous Q M,W,F-HD    BMET    Component Value Date/Time   NA 134* 01/25/2015 0550   K 3.7 01/25/2015 0550   CL 98* 01/25/2015 0550   CO2 21* 01/25/2015 0550   GLUCOSE 80 01/25/2015 0550   BUN 77* 01/25/2015 0550   CREATININE 12.61* 01/25/2015 0550   CREATININE 1.15 12/17/2014 1707   CALCIUM 8.0* 01/25/2015 0550   GFRNONAA 4* 01/25/2015 0550   GFRNONAA 68 12/17/2014 1707   GFRAA 4* 01/25/2015 0550   GFRAA 79 12/17/2014 1707   CBC    Component  Value Date/Time   WBC 6.9 01/25/2015 0550   RBC 2.69* 01/25/2015 0550   HGB 8.2* 01/25/2015 0550   HCT 23.5* 01/25/2015 0550   PLT 170 01/25/2015 0550   MCV 87.4 01/25/2015 0550   MCH 30.5 01/25/2015 0550   MCHC 34.9 01/25/2015 0550   RDW 15.4 01/25/2015 0550   LYMPHSABS 1.0 01/21/2015 0312   MONOABS 1.1* 01/21/2015 0312   EOSABS 1.0* 01/21/2015 0312   BASOSABS 0.1 01/21/2015 0312     Assessment/Plan:  1. AoCKD, presumably due to septic shock related ATN 1. CVVHD stopped 01/14/15 and transitioned to intermittent HD 01/16/15.   2. ? Inc UOP and less inc in BUN/SCr suggestive of recovery 3. Next HD tentatively 9/5 -- BUT hold on orders until see UOP and Labs in AM, using temp HD cath -- Femoral 4. If remains afebrile over weekend and clearly needs more HD will plan for transition to [facility] on 9/5 5. Reattempt high dose lasix this AM 2. SIRS- possible PE (RA and RV dilated, RV hypocontractile on ECHO). CT angio on hold for now (concerns for renal recovery as well as IV access).plan is long term anticoagulation without CT angio Currently off of pressors and excubated. 3. CAD- cath revealed occluded OM, cardiology following 4. Shock liver- improved.  5. S/p exploratory lap and fascia closure now s/p wound vac - with some bleeding, improved after holding anticoag, now back on anticoag.  6. Metabolic acidosis 7. Hyponatremia.  8. Thrombocytopenia- hit panel neg. Likely 2/2 to shocked liver. Now improved. Back on heparin - no bleeding currently.  9. Hypoalbuminemia- per primary svc 10. Nutrition: off TPN, taking some PO 11. Persistent Fevers: 8/30: temp HD cath removed.  Started Vanc.  B Cx x2 NGTD   Jared [name] B

## 2015-01-25 NOTE — Evaluation (Addendum)
Clinical/Bedside Swallow Evaluation Patient Details  Name: HAIM KILPELA MRN: [id number] Date of Birth: [birth year]  Today's Date: 01/25/2015 Time: SLP Start Time (ACUTE ONLY): 1405 SLP Stop Time (ACUTE ONLY): 1428 SLP Time Calculation (min) (ACUTE ONLY): 23 min  Past Medical History:  Past Medical History  Diagnosis Date  . Chest pain   . HTN (hypertension)   . Hyperlipemia   . DJD (degenerative joint disease)   . BPH (benign prostatic hypertrophy)   . Obesity   . OSA (obstructive sleep apnea)   . Hypercholesteremia   . Chronic back pain   . Anxiety   . Depression    Past Surgical History:  Past Surgical History  Procedure Laterality Date  . Back surgery    . Prostatectomy    . Cervical spine surgery    . Laparotomy N/A 01/08/2015    Procedure: EXPLORATORY LAPAROTOMY;  Surgeon: Manus Rudd, MD;  Location: [facility] OR;  Service: General;  Laterality: N/A;  . Application of wound vac N/A 01/08/2015    Procedure: APPLICATION OF WOUND VAC;  Surgeon: Manus Rudd, MD;  Location: [facility] OR;  Service: General;  Laterality: N/A;  . Wound debridement N/A 01/11/2015    Procedure: DEBRIDEMENT WOUND AND VAC DRESSING CHANGE;  Surgeon: Manus Rudd, MD;  Location: [facility] OR;  Service: General;  Laterality: N/A;  . Cardiac catheterization N/A 01/13/2015    Procedure: Left Heart Cath and Coronary Angiography;  Surgeon: Corky Crafts, MD;  Location: [facility] INVASIVE CV LAB;  Service: Cardiovascular;  Laterality: N/A;   HPI:  Pt is a 59 year old male with a PMH of chronic back pain, chest pain, hypertension, hyperlipemia, obesity, obstructive sleep apnea, hypercholesteremia, and depression. Amitted after being found down at home. Intubated 8/18-8/28. Per MD note hospital course includes exploratory laparotomy, cardiac catheterization, and  dialysist. Found to have pulmonary embolism, difficulty with his abdominal wound, septic shock, acute respiratory failure with hypoxia, cardigenic shock, acute kidney  injury, and renal failure. Initial BSE 8/29 with recommendation of liquid diet thickened to nectar and ice chips PRN after oral care. The following day, after tx, ST recommended upgrade to thin liquids, upgrade to regular textures when medically ready and signed off. Currently on a full liquid diet thickened to nectar consistency. ST reassessment ordered. Pt with projectile vomiting 9/3. CXR 9/1 Continued improvement in lung aeration since extubation but persistent bibasilar opacity. Cannot exclude pneumonia as cause of fever.   Assessment / Plan / Recommendation Clinical Impression  Pt swallow function reassessed for possible upgrade from clears thickened to nectar consistency. Pt initially uncooperative and needed mod-max verbal encouragement to consume 3 bites cracker and 3-4 sips thin water. Vocal quality remained clear and strong, no cough or throat clear during limited evaluation. Functional mastication with solid texture. Pt continues to be at increased aspiration risk given cognitive deficits and overall complicated hospital course. Received order for Dys 3 texture and thin liquids (from a GI standpoint), with small sips, straws allowed, sit upright, full supervision due to cognitive impairments. ST will continue to follow.       Aspiration Risk  Moderate    Diet Recommendation Dysphagia 3 (Mech soft);Thin   Medication Administration: Whole meds with puree Compensations: Slow rate;Small sips/bites;Check for pocketing    Other  Recommendations Oral Care Recommendations: Oral care QID   Follow Up Recommendations       Frequency and Duration min 2x/week  2 weeks   Pertinent Vitals/Pain none  Swallow Study           Oral/Motor/Sensory Function Overall Oral Motor/Sensory Function: Appears within functional limits for tasks assessed   Ice Chips Ice chips: Not tested   Thin Liquid Thin Liquid: Within functional limits Presentation: Straw;Cup    Nectar Thick Nectar Thick  Liquid: Not tested   Honey Thick Honey Thick Liquid: Not tested   Puree Puree: Not tested   Solid   GO    Solid: Within functional limits       Royce Macadamia 01/25/2015,2:53 PM  Breck Coons Lonell Face.Ed [employer] [phone]

## 2015-01-25 NOTE — Progress Notes (Addendum)
ANTICOAGULATION CONSULT NOTE - Follow Up Consult  Pharmacy Consult for Heparin Indication: pulmonary embolus   Allergies  Allergen Reactions  . Morphine And Related Anaphylaxis and Shortness Of Breath  . Montelukast Other (See Comments)    unknown  . Neurontin [Gabapentin] Other (See Comments)    delusional  . Wellbutrin [Bupropion] Palpitations    Insomnia    Patient Measurements: Weight: 280 lb 3.3 oz (127.1 kg)  IBW: 83 kg Heparin Dosing Weight: 111.8 kg  Vital Signs: Temp: 98.1 F (36.7 C) (09/04 0317) Temp Source: Oral (09/04 0317) BP: 130/69 mmHg (09/04 0317) Pulse Rate: 71 (09/04 0317)  Labs:  Recent Labs  01/23/15 0715 01/23/15 0716  01/24/15 0445 01/24/15 1427 01/25/15 0550  HGB 8.1*  --   --  8.6*  --  8.2*  HCT 23.4*  --   --  23.7*  --  23.5*  PLT 158  --   --  142*  --  170  HEPARINUNFRC 0.89*  --   < > 0.61 0.49 0.37  CREATININE  --  14.49*  --  10.09*  --  12.61*  < > = values in this interval not displayed.  Estimated Creatinine Clearance: 8.8 mL/min (by C-G formula based on Cr of 12.61).   Medications:  Heparin @ 2150 units/hr (21.5 ml/hr)  Assessment: 59 YOM with RV failure and presumed PE on heparin for anticoagulation. Heparin level this morning remains therapeutic (HL 0.37 << 0.49, goal of 0.3-0.7). CBC stable. RN reports some bleeding around the HD catheter site that is requiring some care but is manageable. Will watch closely.   Goal of Therapy:  Heparin level 0.3-0.7 units/ml Monitor platelets by anticoagulation protocol: Yes   Plan:  1. Continue heparin at the current rate of 2150 units/hr (21.5 ml/hr) 2. Will follow-up with plans to transition the patient to an oral anticoagulant option 3. Will continue to monitor for any signs/symptoms of bleeding and will follow up with heparin level in 8 hours to confirm therapeutic   Georgina Pillion, PharmD, BCPS Clinical Pharmacist Pager: [phone] 01/25/2015 7:51 AM

## 2015-01-25 NOTE — Progress Notes (Addendum)
Pt OOB assist X 1 to chair for 2 hour period to alleviate back pain. Pt confused, high fall risk. Wife upset as pt blames her for allowing medical interventions. RN working to improve pt's intake. Diet advanced to DYS 3.

## 2015-01-26 LAB — RPR: RPR: NONREACTIVE

## 2015-01-26 LAB — CBC
HEMATOCRIT: 23.2 % — AB (ref 39.0–52.0)
Hemoglobin: 7.9 g/dL — ABNORMAL LOW (ref 13.0–17.0)
MCH: 30 pg (ref 26.0–34.0)
MCHC: 34.1 g/dL (ref 30.0–36.0)
MCV: 88.2 fL (ref 78.0–100.0)
PLATELETS: 246 10*3/uL (ref 150–400)
RBC: 2.63 MIL/uL — ABNORMAL LOW (ref 4.22–5.81)
RDW: 15.4 % (ref 11.5–15.5)
WBC: 8 10*3/uL (ref 4.0–10.5)

## 2015-01-26 LAB — RENAL FUNCTION PANEL
ALBUMIN: 2 g/dL — AB (ref 3.5–5.0)
Anion gap: 14 (ref 5–15)
BUN: 87 mg/dL — AB (ref 6–20)
CALCIUM: 8.1 mg/dL — AB (ref 8.9–10.3)
CO2: 23 mmol/L (ref 22–32)
CREATININE: 14.55 mg/dL — AB (ref 0.61–1.24)
Chloride: 97 mmol/L — ABNORMAL LOW (ref 101–111)
GFR calc Af Amer: 4 mL/min — ABNORMAL LOW (ref >60)
GFR, EST NON AFRICAN AMERICAN: 3 mL/min — AB (ref >60)
GLUCOSE: 99 mg/dL (ref 65–99)
PHOSPHORUS: 7.3 mg/dL — AB (ref 2.5–4.6)
POTASSIUM: 3.7 mmol/L (ref 3.5–5.1)
SODIUM: 134 mmol/L — AB (ref 135–145)

## 2015-01-26 LAB — VITAMIN B12: VITAMIN B 12: 822 pg/mL (ref 180–914)

## 2015-01-26 LAB — AMMONIA: Ammonia: 38 umol/L — ABNORMAL HIGH (ref 9–35)

## 2015-01-26 LAB — FOLATE: Folate: 11.4 ng/mL (ref >5.9)

## 2015-01-26 LAB — IRON AND TIBC
Iron: 26 ug/dL — ABNORMAL LOW (ref 45–182)
SATURATION RATIOS: 12 % — AB (ref 17.9–39.5)
TIBC: 209 ug/dL — ABNORMAL LOW (ref 250–450)
UIBC: 183 ug/dL

## 2015-01-26 LAB — RETICULOCYTES
RBC.: 2.63 MIL/uL — AB (ref 4.22–5.81)
RETIC COUNT ABSOLUTE: 34.2 10*3/uL (ref 19.0–186.0)
RETIC CT PCT: 1.3 % (ref 0.4–3.1)

## 2015-01-26 LAB — HEPARIN LEVEL (UNFRACTIONATED): Heparin Unfractionated: 0.53 IU/mL (ref 0.30–0.70)

## 2015-01-26 LAB — FERRITIN: Ferritin: 1097 ng/mL — ABNORMAL HIGH (ref 24–336)

## 2015-01-26 LAB — HIV ANTIBODY (ROUTINE TESTING W REFLEX): HIV Screen 4th Generation wRfx: NONREACTIVE

## 2015-01-26 MED ORDER — HYDROMORPHONE HCL 1 MG/ML IJ SOLN
INTRAMUSCULAR | Status: AC
  Filled 2015-01-26: qty 1

## 2015-01-26 MED ORDER — HEPARIN SODIUM (PORCINE) 1000 UNIT/ML DIALYSIS: 20.0000 [IU]/kg | INTRAMUSCULAR | Status: DC | PRN

## 2015-01-26 MED ORDER — DARBEPOETIN ALFA 100 MCG/0.5ML IJ SOSY
100.0000 ug | PREFILLED_SYRINGE | INTRAMUSCULAR | Status: DC
  Administered 2015-01-26: 100 ug via INTRAVENOUS
  Filled 2015-01-26 (×3): qty 0.5

## 2015-01-26 MED ORDER — DARBEPOETIN ALFA 100 MCG/0.5ML IJ SOSY
PREFILLED_SYRINGE | INTRAMUSCULAR | Status: AC
  Filled 2015-01-26: qty 0.5

## 2015-01-26 NOTE — Progress Notes (Signed)
[facility] TEAM 1 - Stepdown/ICU TEAM Progress Note  Jared [name] ZOX:[id number] DOB: 30-Oct-1955 DOA: 01/08/2015 PCP: Nadean Corwin, MD  Admit HPI / Brief Narrative: 59 year old M Hx Depression, Anxiety, HTN, HLD, OSA, and Chronic Back Pain who presented 8/18 after being found down at home. Responsive to narcan via EMS. In ED was profoundly hypotensive with elevated WBC, refractory to volume. In ER noted to have lactate 8.5, hyperkalemia and started on pressors. PCCM admitted the pt.    Significant Events: 8/18 Admit - CCS, Cardiology consulted; brief PEA arrest; exploratory laparotomy with placement of wound vac; started heparin gtt for presumed PE 8/20 CRRT  8/21 OR for abd wound closure  8/22 Anuric. On CRRT. Off pressors. Improved Shock Liver. On fent gtt and heparing gtt. 40% fio2 8/23 ileus 8/23 Cardiac catheterization left circumflex, second obtuse marginal branch,Ost 2d Mrg-2d Mrg lesion or percent stenosed 8/25 HIT Ab screen normal - TNA initiated  8/26 Bleeding around abd wound - surface bled and IV heparin held   HPI/Subjective: There has been no recurrence of the patient's recent vomiting.  He remains alert and conversant but confused.  He denies any complaints whatsoever and is fixated on "going home soon".  He clearly does not grasp the serious nature of his illness.   Assessment/Plan:  Sepsis with presumed abdominal source > exp lap unrevealing  Vanc stopped 8/23 - Zosyn stopped 8/26 - temp up to 101 8/30 - Vanc resumed 8/30 - HD catheter removed 8/30 - new temp cath placed 8/31 right femoral - WBC has normalized - the patient is afebrile - follow up blood cultures negative 4 thus far - thank course now completed - nephrology to consider placement of tunneled catheter early this week  Clinically diagnosed PE RA and RV dliated w/ R heart failure on TTE - no CTa due to renal failure - long term anticoag to continue - venous duplex w/o evidence of DVT - follow  hemoglobin - no oozing at femoral cath appreciated today  Projectile vomiting Appears to have been an episodic issue - KUB without evidence of obstruction/ileus - clinical exam reveals bowel sounds present - currently tolerating diet without difficulty  CAD Cardiac cath noted an occluded OM - Cardiology has evaluated and plans no further cardiac workup  Acute abdominal pain s/p laparotomy -wound VAC now discontinued - continue with wound care per general surgery recommendations  Altered mental status  -Appears to be multifactorial - mental status is slowly improving, but appears to have stalled at his current level - metabolic evaluation unrevealing  Acute renal failure/ATN with metab acidosis and hyperkalemia in CKD  - renal US neg  - CRRT started per renal 8/20 - stopped 01/15/15  - On HD since 01/16/15 - RF and pan autoimmune negative 01/14/15 - Nephrology following - will need eventual tunneled HD cath once infection risk/status more clear (hopefully Tuesday) - baseline creatinine 1.14 from 12/17/14  OSA -CPAP per respiratory, though patient is frequently refusing  Anemia/thrombocytopenia - Most likely multifactorial to include acute illness, recent surgery, and poor diet - HIT panel negative - platelet count is climbing  Shock liver -LFTs have nearly normalized  A- Fib -Remains in NSR - off amio since 01/11/15  Systolic CHF/RV dilation -volume management per HD   Pulmonary hypertension  Hyperglycemia  -CBG currently normal   Code Status: FULL Family Communication: Spoke with wife at bedside  Disposition Plan: Transfer to nephrology floor - continue PT/OT - await determination of chronicity of  hemodialysis with possible need to place tunneled dialysis catheter  Consultants: PCCM Gen Surgery  [facility] Cardiology  Nephrology  Procedure/Significant Events: 8/18 Echo - EF 45 to 50%, mod RV dilation, mod/severe RV dysfx, PAS 47 mmHg 8/18 Renal u/s - b/l renal  echogenicity  DVT prophylaxis: Heparin drip  Objective: Blood pressure 134/63, pulse 63, temperature 98.4 F (36.9 C), temperature source Oral, resp. rate 14, weight 127.1 kg (280 lb 3.3 oz), SpO2 100 %.  Intake/Output Summary (Last 24 hours) at 01/26/15 1128 Last data filed at 01/26/15 0923  Gross per 24 hour  Intake 745.01 ml  Output    350 ml  Net 395.01 ml   Exam: General: No acute respiratory distress - alert but confused Lungs: Clear to auscultation bilaterally without wheeze  Cardiovascular: Regular rate and rhythm without murmur  Abdomen: Large midline wound dressed and dry, faint bowel sounds, soft, no rebound  Extremities: No significant cyanosis or clubbing - 1+ B LE edema  Data Reviewed: Basic Metabolic Panel:  Recent Labs Lab 01/20/15 0538 01/21/15 0312 01/22/15 0411 01/23/15 0715 01/23/15 0716 01/24/15 0445 01/25/15 0550 01/26/15 0333  NA 129* 129* 130*  --  129* 134* 134* 134*  K 4.3 4.2 3.2*  --  3.6 3.6 3.7 3.7  CL 97* 97* 96*  --  97* 97* 98* 97*  CO2 17* 15* 23  --  18* 25 21* 23  GLUCOSE 107* 103* 116*  --  78 86 80 99  BUN 112* 131* 80*  --  114* 61* 77* 87*  CREATININE 13.86* 15.16* 9.70*  --  14.49* 10.09* 12.61* 14.55*  CALCIUM 8.0* 8.1* 7.9*  --  8.2* 8.0* 8.0* 8.1*  MG 2.2 2.2 1.8 2.0  --  2.0  --   --   PHOS 7.6*  --  4.7*  --  7.0* 4.7* 6.8* 7.3*     Liver Function Tests:  Recent Labs Lab 01/21/15 0312 01/22/15 0411 01/23/15 0716 01/24/15 0445 01/25/15 0550 01/26/15 0333  AST 30 33  --  54* 45*  --   ALT 115* 99*  --  84* 70*  --   ALKPHOS 87 104  --  148* 138*  --   BILITOT 3.1* 2.6*  --  2.1* 2.0*  --   PROT 4.8* 5.0*  --  5.1* 5.1*  --   ALBUMIN 1.9* 1.9* 1.8* 2.0*  2.0* 2.0*  2.0* 2.0*    CBC:  Recent Labs Lab 01/21/15 0312 01/22/15 0411 01/23/15 0715 01/24/15 0445 01/25/15 0550 01/26/15 0333  WBC 12.3* 8.8 5.2 6.6 6.9 8.0  NEUTROABS 9.1*  --   --   --   --   --   HGB 9.6* 8.8* 8.1* 8.6* 8.2* 7.9*  HCT  26.9* 24.5* 23.4* 23.7* 23.5* 23.2*  MCV 87.3 86.6 87.3 86.8 87.4 88.2  PLT 145* 120* 158 142* 170 246    CBG:  Recent Labs Lab 01/22/15 0611 01/22/15 1646 01/23/15 0856 01/23/15 1622 01/24/15 1544  GLUCAP 120* 102* 78 76 97    Recent Results (from the past 240 hour(s))  Culture, blood (routine x 2)     Status: None   Collection Time: 01/20/15  3:48 PM  Result Value Ref Range Status   Specimen Description BLOOD PORTA CATH  Final   Special Requests BOTTLES DRAWN AEROBIC AND ANAEROBIC  Final   Culture NO GROWTH 5 DAYS  Final   Report Status 01/25/2015 FINAL  Final  Culture, blood (routine x 2)  Status: None   Collection Time: 01/20/15  4:25 PM  Result Value Ref Range Status   Specimen Description BLOOD PORTA CATH  Final   Special Requests BOTTLES DRAWN AEROBIC AND ANAEROBIC  Final   Culture NO GROWTH 5 DAYS  Final   Report Status 01/25/2015 FINAL  Final  Culture, blood (routine x 2)     Status: None (Preliminary result)   Collection Time: 01/22/15  3:25 PM  Result Value Ref Range Status   Specimen Description BLOOD LEFT ARM  Final   Special Requests BOTTLES DRAWN AEROBIC ONLY 5CC  Final   Culture NO GROWTH 3 DAYS  Final   Report Status PENDING  Incomplete  Culture, blood (routine x 2)     Status: None (Preliminary result)   Collection Time: 01/22/15  3:30 PM  Result Value Ref Range Status   Specimen Description BLOOD RIGHT ARM  Final   Special Requests BOTTLES DRAWN AEROBIC ONLY 2CC  Final   Culture NO GROWTH 3 DAYS  Final   Report Status PENDING  Incomplete  Culture, Urine     Status: None   Collection Time: 01/22/15  6:03 PM  Result Value Ref Range Status   Specimen Description URINE, CATHETERIZED  Final   Special Requests NONE  Final   Culture NO GROWTH 1 DAY  Final   Report Status 01/23/2015 FINAL  Final     Studies:  Recent x-ray studies have been reviewed in detail by the Attending Physician  Scheduled Meds:  Scheduled Meds: . antiseptic  oral rinse  7 mL Mouth Rinse QID  . aspirin  81 mg Oral Daily  . chlorhexidine gluconate  15 mL Mouth Rinse BID  . darbepoetin (ARANESP) injection - DIALYSIS  100 mcg Intravenous Q Mon-HD  . heparin flush  20 Units Intracatheter Q12H  . multivitamin with minerals  1 tablet Oral Daily  . pantoprazole  40 mg Oral QHS  . sodium chloride  10 mL Intravenous Q12H  . vancomycin  1,000 mg Intravenous Q M,W,F-HD    Time spent on care of this patient: 25 mins  Lonia Blood, MD [facility] For Consults/Admissions - Flow Manager - [phone] Office  [phone]  Contact MD directly via text page:      [email or web address]      password [facility]  01/26/2015, 11:28 AM   LOS: 18 days

## 2015-01-26 NOTE — Progress Notes (Signed)
Speech Language Pathology Treatment: Dysphagia  Patient Details Name: Jared [name] MRN: [id number] DOB: [birth year] Today's Date: 01/26/2015 Time: 1740-8144 SLP Time Calculation (min) (ACUTE ONLY): 13 min  Assessment / Plan / Recommendation Clinical Impression  Pt observed with thin liquids after upgrade yesterday. No s/s aspiration. Min verbal reminders to use caution with straw. Recommend upgrade to regular texture and thin liquids. No follow up needed.   HPI Other Pertinent Information: Pt is a 59 year old male with a PMH of chronic back pain, chest pain, hypertension, hyperlipemia, obesity, obstructive sleep apnea, hypercholesteremia, and depression. Amitted after being found down at home. Intubated 8/18-8/28. Per MD note hospital course includes exploratory laparotomy, cardiac catheterization, and  dialysist. Found to have pulmonary embolism, difficulty with his abdominal wound, septic shock, acute respiratory failure with hypoxia, cardigenic shock, acute kidney injury, and renal failure. Initial BSE 8/29 with recommendation of liquid diet thickened to nectar and ice chips PRN after oral care. The following day, after tx, ST recommended upgrade to thin liquids, upgrade to regular textures when medically ready and signed off. Currently on a full liquid diet thickened to nectar consistency. ST reassessment ordered. Pt with projectile vomiting 9/3. CXR 9/1 Continued improvement in lung aeration since extubation but persistent bibasilar opacity. Cannot exclude pneumonia as cause of fever.   Pertinent Vitals Pain Assessment: Faces Faces Pain Scale: Hurts whole lot Pain Location: Low back during leg movement Pain Descriptors / Indicators: Cramping;Discomfort Pain Intervention(s): Monitored during session;Relaxation;Utilized relaxation techniques  SLP Plan  Discharge SLP treatment due to (comment)    Recommendations Diet recommendations: Dysphagia 3 (mechanical soft);Thin liquid Liquids provided  via: Cup;Straw Medication Administration: Whole meds with liquid Supervision: Patient able to self feed;Intermittent supervision to cue for compensatory strategies Compensations: Slow rate;Small sips/bites Postural Changes and/or Swallow Maneuvers: Seated upright 90 degrees              Oral Care Recommendations: Oral care BID Follow up Recommendations: None Plan: Discharge SLP treatment due to (comment)    GO     Royce Macadamia 01/26/2015, 1:23 PM   Breck Coons Lonell Face.Ed [employer] [phone]

## 2015-01-26 NOTE — Procedures (Signed)
Pt refuses CPAP for this evening.   

## 2015-01-26 NOTE — Progress Notes (Signed)
Pt refused to have his abdominal dressing changed stating it is not going to be changed tonight because I don't feel like it. Pt was explained the importance of the dressing changed and stated he just want to rest. Will f/u.

## 2015-01-26 NOTE — Progress Notes (Addendum)
ANTICOAGULATION & ANTIBIOTIC CONSULT NOTE - Follow Up Consult  Pharmacy Consult for Heparin & Vancomycin  Indication: pulmonary embolus & concern for HD cath infection  Allergies  Allergen Reactions  . Morphine And Related Anaphylaxis and Shortness Of Breath  . Montelukast Other (See Comments)    unknown  . Neurontin [Gabapentin] Other (See Comments)    delusional  . Wellbutrin [Bupropion] Palpitations    Insomnia    Patient Measurements: Weight: 280 lb 3.3 oz (127.1 kg)  IBW: 83 kg Heparin Dosing Weight: 111.8 kg  Vital Signs: Temp: 98.4 F (36.9 C) (09/05 0700) Temp Source: Oral (09/05 0700) BP: 134/63 mmHg (09/05 0700) Pulse Rate: 63 (09/05 0700)  Labs:  Recent Labs  01/24/15 0445 01/24/15 1427 01/25/15 0550 01/26/15 0333  HGB 8.6*  --  8.2* 7.9*  HCT 23.7*  --  23.5* 23.2*  PLT 142*  --  170 246  HEPARINUNFRC 0.61 0.49 0.37 0.53  CREATININE 10.09*  --  12.61* 14.55*    Estimated Creatinine Clearance: 7.6 mL/min (by C-G formula based on Cr of 14.55).   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 Heparin level this morning remains therapeutic (HL 0.38 << 0.53, goal of 0.3-0.7). Hgb slow drop over the past several days with some bleeding around the HD catheter site that is requiring some care but is manageable. Appears to be stabilizing today, Hgb 8.3 << 7.9. Will continue to watch closely - no changes in heparin drip rate today.  Goal of Therapy:  Heparin level 0.3-0.7 units/ml Monitor platelets by anticoagulation protocol: Yes   Plan:  1. Continue Heparin at 2100 units/hr (21 ml/hr) 2. Will continue to monitor for any signs/symptoms of bleeding and will follow up with heparin level in the a.m.   Georgina Pillion, PharmD, BCPS Clinical Pharmacist Pager: [phone] 01/26/2015 7:42 AM

## 2015-01-26 NOTE — Progress Notes (Signed)
Physical Therapy Treatment Patient Details Name: Jared [name] MRN: [id number] DOB: [birth year] Today's Date: 01/26/2015    History of Present Illness Pt is a 59 y/o male with a PMH of chronic back pain, chest pain, HTN, hyerlipidemia, obesity, sleep apnea, depression. Pt was found down at home by wife on 01/08/15. He was intubated due to acute respiratory failure. Pt had exploratory laparotomy, cardiac catheterization, and received dialysis treatment. Pt's primary issues are pulmonary embolism and difficulty with his abdominal wound. Pt's active problems are spetic shock, acute respiratory failure with hypoxia, acute respiratory failure with hypoxemia, cardigenic shock, acute kidney injury, and renal failure. Pt was extubated on 01/18/15 after a 10 day intubation period.    PT Comments    Patient in bed, agreeable to participate in PT today after convincing due to patient's lethargy. .Patient participated in exercise as described below. Bed mobility and transfers still deferred due to temporary R LE HD cath placement. Patient will benefit from continued PT to maintain exercise program while cath is still in place, and to progress mobility when cath is removed or changed.   Follow Up Recommendations  CIR;Supervision/Assistance - 24 hour     Equipment Recommendations  Other (comment) (TBA)    Recommendations for Other Services       Precautions / Restrictions Precautions Precautions: Fall;Other (comment) (R LE temporary HD cath) Restrictions Weight Bearing Restrictions: No    Mobility  Bed Mobility               General bed mobility comments: Bed mobility deferred due to temporary HD cath in R LE.  Transfers                    Ambulation/Gait                 Stairs            Wheelchair Mobility    Modified Rankin (Stroke Patients Only)       Balance                                    Cognition Arousal/Alertness:  Lethargic Behavior During Therapy: WFL for tasks assessed/performed Overall Cognitive Status: No family/caregiver present to determine baseline cognitive functioning Area of Impairment: Following commands;Problem solving;Memory     Memory: Decreased short-term memory;Decreased recall of precautions Following Commands: Follows one step commands inconsistently;Follows one step commands with increased time Safety/Judgement: Decreased awareness of safety   Problem Solving: Slow processing;Requires verbal cues;Requires tactile cues General Comments: Patient was very tired throughout session, required verbal and tactile cues throughout to participate in exercise. Remained awake and conversative, but constantly complaining about tiredness.    Exercises General Exercises - Upper Extremity Shoulder Flexion: AROM;Both;10 reps;Supine Elbow Flexion: AROM;Both;10 reps;Supine Elbow Extension: AROM;Both;10 reps;Supine Wrist Flexion: AROM;Both;10 reps;Supine Wrist Extension: AROM;Both;10 reps;Supine General Exercises - Lower Extremity Ankle Circles/Pumps: Left;20 reps;Supine;AROM Heel Slides: AAROM;PROM;Left;10 reps;Supine Straight Leg Raises: AROM;Left;5 reps;Supine    General Comments        Pertinent Vitals/Pain Pain Assessment: Faces Faces Pain Scale: Hurts whole lot Pain Location: Low back during leg movement Pain Descriptors / Indicators: Cramping;Discomfort Pain Intervention(s): Monitored during session;Relaxation;Utilized relaxation techniques    Home Living                      Prior Function            PT  Goals (current goals can now be found in the care plan section) Acute Rehab PT Goals Patient Stated Goal: Get out of here. PT Goal Formulation: With patient Time For Goal Achievement: 02/02/15 Potential to Achieve Goals: Fair Progress towards PT goals: Not progressing toward goals - comment (Due to temporary HD cath in R LE)    Frequency  Min 3X/week    PT  Plan Current plan remains appropriate    Co-evaluation             End of Session   Activity Tolerance: Patient limited by fatigue;Patient limited by lethargy Patient left: in bed;with bed alarm set;with family/visitor present;with call bell/phone within reach     Time: 1052-1108 PT Time Calculation (min) (ACUTE ONLY): 16 min  Charges:  $Therapeutic Exercise: 8-22 mins                    G CodesMichele Rockers, SPT [phone] 01/26/2015, 1:19 PM  I have read, reviewed and agree with student's note.   [facility] Acute Rehabilitation [phone] (pager)

## 2015-01-26 NOTE — Progress Notes (Signed)
Patient ID: Jared [name], male   DOB: [birth year], 59 y.o.   MRN: [id number]  S:  No new events Maybe inc UOP? Still unable to fully participate in converstaion -- circumstantial- tells me that he had AKI several years ago at [facility] hospital but did not require HD   O:BP 134/63 mmHg  Pulse 63  Temp(Src) 98.4 F (36.9 C) (Oral)  Resp 14  Wt 127.1 kg (280 lb 3.3 oz)  SpO2 100%  Intake/Output Summary (Last 24 hours) at 01/26/15 0920 Last data filed at 01/26/15 0700  Gross per 24 hour  Intake 679.51 ml  Output    350 ml  Net 329.51 ml   Intake/Output: I/O last 3 completed shifts: In: 1355 [P.O.:450; I.V.:839; IV Piggyback:66] Out: 475 [Urine:475]  Intake/Output this shift:    Weight change: 0 kg (0 lb) Gen: pleasant male, lying in bed, Heart: RRR, no m/r/g Resp:bibasilar crackles.  Abd:+BS, binder in place, tender to palpation EXT: trace - 1+ edema Neuro: awake today, oriented to self only, tangential   Recent Labs Lab 01/20/15 0538 01/21/15 0312 01/22/15 0411 01/23/15 0716 01/24/15 0445 01/25/15 0550 01/26/15 0333  NA 129* 129* 130* 129* 134* 134* 134*  K 4.3 4.2 3.2* 3.6 3.6 3.7 3.7  CL 97* 97* 96* 97* 97* 98* 97*  CO2 17* 15* 23 18* 25 21* 23  GLUCOSE 107* 103* 116* 78 86 80 99  BUN 112* 131* 80* 114* 61* 77* 87*  CREATININE 13.86* 15.16* 9.70* 14.49* 10.09* 12.61* 14.55*  ALBUMIN 1.9* 1.9* 1.9* 1.8* 2.0*  2.0* 2.0*  2.0* 2.0*  CALCIUM 8.0* 8.1* 7.9* 8.2* 8.0* 8.0* 8.1*  PHOS 7.6*  --  4.7* 7.0* 4.7* 6.8* 7.3*  AST  --  30 33  --  54* 45*  --   ALT  --  115* 99*  --  84* 70*  --    Liver Function Tests:  Recent Labs Lab 01/22/15 0411  01/24/15 0445 01/25/15 0550 01/26/15 0333  AST 33  --  54* 45*  --   ALT 99*  --  84* 70*  --   ALKPHOS 104  --  148* 138*  --   BILITOT 2.6*  --  2.1* 2.0*  --   PROT 5.0*  --  5.1* 5.1*  --   ALBUMIN 1.9*  < > 2.0*  2.0* 2.0*  2.0* 2.0*  < > = values in this interval not displayed. No results for input(s):  LIPASE, AMYLASE in the last 168 hours.  Recent Labs Lab 01/22/15 1743 01/26/15 0333  AMMONIA 33 38*   CBC:  Recent Labs Lab 01/21/15 0312 01/22/15 0411 01/23/15 0715 01/24/15 0445 01/25/15 0550 01/26/15 0333  WBC 12.3* 8.8 5.2 6.6 6.9 8.0  NEUTROABS 9.1*  --   --   --   --   --   HGB 9.6* 8.8* 8.1* 8.6* 8.2* 7.9*  HCT 26.9* 24.5* 23.4* 23.7* 23.5* 23.2*  MCV 87.3 86.6 87.3 86.8 87.4 88.2  PLT 145* 120* 158 142* 170 246   Cardiac Enzymes: No results for input(s): CKTOTAL, CKMB, CKMBINDEX, TROPONINI in the last 168 hours. CBG:  Recent Labs Lab 01/22/15 0611 01/22/15 1646 01/23/15 0856 01/23/15 1622 01/24/15 1544  GLUCAP 120* 102* 78 76 97    Iron Studies:   Recent Labs  01/26/15 0333  IRON 26*  TIBC 209*  FERRITIN 1097*   Studies/Results: Dg Abd Portable 1v  01/24/2015   CLINICAL DATA:  Projectile vomiting.  EXAM: PORTABLE ABDOMEN -  1 VIEW  COMPARISON:  01/13/2015.  FINDINGS: Normal bowel gas pattern. Lumbosacral laminectomy defects and fixation hardware.  IMPRESSION: No acute abnormality.   Electronically Signed   By: Beckie Salts M.D.   On: 01/24/2015 19:47   . antiseptic oral rinse  7 mL Mouth Rinse QID  . aspirin  81 mg Oral Daily  . chlorhexidine gluconate  15 mL Mouth Rinse BID  . heparin flush  20 Units Intracatheter Q12H  . multivitamin with minerals  1 tablet Oral Daily  . pantoprazole  40 mg Oral QHS  . sodium chloride  10 mL Intravenous Q12H  . vancomycin  1,000 mg Intravenous Q M,W,F-HD    BMET    Component Value Date/Time   NA 134* 01/26/2015 0333   K 3.7 01/26/2015 0333   CL 97* 01/26/2015 0333   CO2 23 01/26/2015 0333   GLUCOSE 99 01/26/2015 0333   BUN 87* 01/26/2015 0333   CREATININE 14.55* 01/26/2015 0333   CREATININE 1.15 12/17/2014 1707   CALCIUM 8.1* 01/26/2015 0333   GFRNONAA 3* 01/26/2015 0333   GFRNONAA 68 12/17/2014 1707   GFRAA 4* 01/26/2015 0333   GFRAA 79 12/17/2014 1707   CBC    Component Value Date/Time   WBC  8.0 01/26/2015 0333   RBC 2.63* 01/26/2015 0333   RBC 2.63* 01/26/2015 0333   HGB 7.9* 01/26/2015 0333   HCT 23.2* 01/26/2015 0333   PLT 246 01/26/2015 0333   MCV 88.2 01/26/2015 0333   MCH 30.0 01/26/2015 0333   MCHC 34.1 01/26/2015 0333   RDW 15.4 01/26/2015 0333   LYMPHSABS 1.0 01/21/2015 0312   MONOABS 1.1* 01/21/2015 0312   EOSABS 1.0* 01/21/2015 0312   BASOSABS 0.1 01/21/2015 0312     Assessment/Plan:  1. AoCKD, presumably due to septic shock related ATN- creatinine 1.15 on July 27 1. CVVHD stopped 01/14/15 and transitioned to intermittent HD 01/16/15- done 8/29 ,  8/31 and 9/2.   2. ? Inc UOP and less inc in BUN/SCr suggestive of recovery 3. Next HD today-- using temp HD cath -- Femoral placed 8/31 4. If remains afebrile over weekend and clearly needs more HD will plan for transition to [facility] this week  2. SIRS- possible PE (RA and RV dilated, RV hypocontractile on ECHO). CT angio on hold for now (concerns for renal recovery as well as IV access).plan is long term anticoagulation without CT angio Currently off of pressors and excubated. 3. CAD- cath revealed occluded OM, cardiology following 4. Shock liver- improved.  5. S/p exploratory lap and fascia closure now s/p wound vac - with some bleeding, improved after holding anticoag, now back on anticoag.  6. Metabolic acidosis- improved 7. Hyponatremia. - improved 8. Thrombocytopenia- hit panel neg. Likely 2/2 to shocked liver. Now improved. Back on heparin - no bleeding currently.  9. Hypoalbuminemia- per primary svc 10. Nutrition: off TPN, taking some PO 11. Persistent Fevers: 8/30: temp HD cath removed.  Started Vanc.  B Cx x2 NGTD 12. Anemia- will add ESA - ferr over 1000 no iron    Jared [name]

## 2015-01-27 ENCOUNTER — Ambulatory Visit: Payer: Self-pay | Admitting: Internal Medicine

## 2015-01-27 LAB — RENAL FUNCTION PANEL
Albumin: 2.1 g/dL — ABNORMAL LOW (ref 3.5–5.0)
Anion gap: 10 (ref 5–15)
BUN: 39 mg/dL — ABNORMAL HIGH (ref 6–20)
CALCIUM: 7.9 mg/dL — AB (ref 8.9–10.3)
CHLORIDE: 100 mmol/L — AB (ref 101–111)
CO2: 27 mmol/L (ref 22–32)
CREATININE: 9.33 mg/dL — AB (ref 0.61–1.24)
GFR, EST AFRICAN AMERICAN: 6 mL/min — AB (ref >60)
GFR, EST NON AFRICAN AMERICAN: 5 mL/min — AB (ref >60)
Glucose, Bld: 105 mg/dL — ABNORMAL HIGH (ref 65–99)
Phosphorus: 4.5 mg/dL (ref 2.5–4.6)
Potassium: 4.1 mmol/L (ref 3.5–5.1)
Sodium: 137 mmol/L (ref 135–145)

## 2015-01-27 LAB — CULTURE, BLOOD (ROUTINE X 2)
CULTURE: NO GROWTH
CULTURE: NO GROWTH

## 2015-01-27 LAB — HEPATIC FUNCTION PANEL
ALBUMIN: 2 g/dL — AB (ref 3.5–5.0)
ALT: 65 U/L — ABNORMAL HIGH (ref 17–63)
AST: 50 U/L — AB (ref 15–41)
Alkaline Phosphatase: 145 U/L — ABNORMAL HIGH (ref 38–126)
Bilirubin, Direct: 0.8 mg/dL — ABNORMAL HIGH (ref 0.1–0.5)
Indirect Bilirubin: 0.8 mg/dL (ref 0.3–0.9)
Total Bilirubin: 1.6 mg/dL — ABNORMAL HIGH (ref 0.3–1.2)
Total Protein: 5.3 g/dL — ABNORMAL LOW (ref 6.5–8.1)

## 2015-01-27 LAB — CBC
HCT: 24.7 % — ABNORMAL LOW (ref 39.0–52.0)
Hemoglobin: 8.3 g/dL — ABNORMAL LOW (ref 13.0–17.0)
MCH: 30.1 pg (ref 26.0–34.0)
MCHC: 33.6 g/dL (ref 30.0–36.0)
MCV: 89.5 fL (ref 78.0–100.0)
PLATELETS: 258 10*3/uL (ref 150–400)
RBC: 2.76 MIL/uL — AB (ref 4.22–5.81)
RDW: 15.5 % (ref 11.5–15.5)
WBC: 8 10*3/uL (ref 4.0–10.5)

## 2015-01-27 LAB — HEPARIN LEVEL (UNFRACTIONATED): HEPARIN UNFRACTIONATED: 0.38 [IU]/mL (ref 0.30–0.70)

## 2015-01-27 MED ORDER — SODIUM CHLORIDE 0.9 % IJ SOLN: 10.0000 mL | INTRAMUSCULAR | Status: DC | PRN

## 2015-01-27 MED ORDER — NEPRO/CARBSTEADY PO LIQD
237.0000 mL | Freq: Two times a day (BID) | ORAL | Status: DC
  Administered 2015-01-28 – 2015-02-04 (×9): 237 mL via ORAL
  Filled 2015-01-27 (×6): qty 237

## 2015-01-27 NOTE — Progress Notes (Signed)
Nutrition Follow-up  DOCUMENTATION CODES:   Obesity unspecified  INTERVENTION:   Provide Nepro Shake po BID, each supplement provides 425 kcal and 19 grams protein.  Encourage adequate PO intake.   NUTRITION DIAGNOSIS:   Inadequate oral intake related to altered GI function as evidenced by other (see comment) (diet just advanced to clear liquids); advanced to regular diet; ongoing  GOAL:   Patient will meet greater than or equal to 90% of their needs; not met  MONITOR:   PO intake, Supplement acceptance, Weight trends, Labs, I & O's  REASON FOR ASSESSMENT:   Consult New TPN/TNA  ASSESSMENT:   59 yo male had severe abdominal pain, and then found unresponsive at home. In ER he was in shock, cyanotic, with lactic acidosis, and hyperkalemia. S/P ex lap with placement of wound VAC to abd wound.  Pt is now off TPN. Diet has been advanced to a regular diet. Meal completion has been 5%. Pt reports his appetite and intake has been improving.  RD to order Nepro Shake to aid in caloric and protein needs. Pt was encouraged to eat his food at meals.   Diet Order:  Diet regular Room service appropriate?: Yes; Fluid consistency:: Thin  Skin:  Wound (see comment) (Closed abd incision, +1 UE, +2 LE edema)  Last BM:  9/5  Height:   Ht Readings from Last 1 Encounters:  01/05/15 6' 2.5" (1.892 m)    Weight:   Wt Readings from Last 1 Encounters:  01/27/15 271 lb 13.2 oz (123.3 kg)    Ideal Body Weight:  87.7 kg  BMI:  Body mass index is 34.44 kg/(m^2).  Estimated Nutritional Needs:   Kcal:  2400-2600  Protein:  150-175 gm  Fluid:  2.4-2.6 L  EDUCATION NEEDS:   No education needs identified at this time  Corrin Parker, MS, RD, LDN Pager # [phone] After hours/ weekend pager # [phone]

## 2015-01-27 NOTE — Progress Notes (Signed)
Patient ID: Jared [name], male   DOB: 1955/09/09, 59 y.o.   MRN: [id number]  S:  Had HD yesterday- removed 2 liters tolerated well UOP not recorded but seems better better converstaion -- tells me that he had AKI several years ago at [facility] hospital but did not require HD   O:BP 157/68 mmHg  Pulse 67  Temp(Src) 98.5 F (36.9 C) (Oral)  Resp 16  Wt 123.3 kg (271 lb 13.2 oz)  SpO2 100%  Intake/Output Summary (Last 24 hours) at 01/27/15 0936 Last data filed at 01/27/15 0221  Gross per 24 hour  Intake    240 ml  Output   2100 ml  Net  -1860 ml   Intake/Output: I/O last 3 completed shifts: In: 688 [P.O.:360; I.V.:328] Out: 2450 [Urine:450; Other:2000]  Intake/Output this shift:    Weight change: 2.1 kg (4 lb 10.1 oz) Gen: pleasant male, lying in bed, Heart: RRR, no m/r/g Resp:bibasilar crackles.  Abd:+BS, binder in place, tender to palpation EXT: trace - 1+ edema Neuro: awake today, oriented to self only, tangential   Recent Labs Lab 01/21/15 0312 01/22/15 0411 01/23/15 0716 01/24/15 0445 01/25/15 0550 01/26/15 0333 01/27/15 0525  NA 129* 130* 129* 134* 134* 134* 137  K 4.2 3.2* 3.6 3.6 3.7 3.7 4.1  CL 97* 96* 97* 97* 98* 97* 100*  CO2 15* 23 18* 25 21* 23 27  GLUCOSE 103* 116* 78 86 80 99 105*  BUN 131* 80* 114* 61* 77* 87* 39*  CREATININE 15.16* 9.70* 14.49* 10.09* 12.61* 14.55* 9.33*  ALBUMIN 1.9* 1.9* 1.8* 2.0*  2.0* 2.0*  2.0* 2.0* 2.0*  2.1*  CALCIUM 8.1* 7.9* 8.2* 8.0* 8.0* 8.1* 7.9*  PHOS  --  4.7* 7.0* 4.7* 6.8* 7.3* 4.5  AST 30 33  --  54* 45*  --  50*  ALT 115* 99*  --  84* 70*  --  65*   Liver Function Tests:  Recent Labs Lab 01/24/15 0445 01/25/15 0550 01/26/15 0333 01/27/15 0525  AST 54* 45*  --  50*  ALT 84* 70*  --  65*  ALKPHOS 148* 138*  --  145*  BILITOT 2.1* 2.0*  --  1.6*  PROT 5.1* 5.1*  --  5.3*  ALBUMIN 2.0*  2.0* 2.0*  2.0* 2.0* 2.0*  2.1*   No results for input(s): LIPASE, AMYLASE in the last 168 hours.  Recent  Labs Lab 01/22/15 1743 01/26/15 0333  AMMONIA 33 38*   CBC:  Recent Labs Lab 01/21/15 0312  01/23/15 0715 01/24/15 0445 01/25/15 0550 01/26/15 0333 01/27/15 0525  WBC 12.3*  < > 5.2 6.6 6.9 8.0 8.0  NEUTROABS 9.1*  --   --   --   --   --   --   HGB 9.6*  < > 8.1* 8.6* 8.2* 7.9* 8.3*  HCT 26.9*  < > 23.4* 23.7* 23.5* 23.2* 24.7*  MCV 87.3  < > 87.3 86.8 87.4 88.2 89.5  PLT 145*  < > 158 142* 170 246 258  < > = values in this interval not displayed. Cardiac Enzymes: No results for input(s): CKTOTAL, CKMB, CKMBINDEX, TROPONINI in the last 168 hours. CBG:  Recent Labs Lab 01/22/15 0611 01/22/15 1646 01/23/15 0856 01/23/15 1622 01/24/15 1544  GLUCAP 120* 102* 78 76 97    Iron Studies:   Recent Labs  01/26/15 0333  IRON 26*  TIBC 209*  FERRITIN 1097*   Studies/Results: No results found. Marland Kitchen antiseptic oral rinse  7 mL Mouth  Rinse QID  . aspirin  81 mg Oral Daily  . chlorhexidine gluconate  15 mL Mouth Rinse BID  . darbepoetin (ARANESP) injection - DIALYSIS  100 mcg Intravenous Q Mon-HD  . heparin flush  20 Units Intracatheter Q12H  . multivitamin with minerals  1 tablet Oral Daily  . pantoprazole  40 mg Oral QHS  . sodium chloride  10 mL Intravenous Q12H    BMET    Component Value Date/Time   NA 137 01/27/2015 0525   K 4.1 01/27/2015 0525   CL 100* 01/27/2015 0525   CO2 27 01/27/2015 0525   GLUCOSE 105* 01/27/2015 0525   BUN 39* 01/27/2015 0525   CREATININE 9.33* 01/27/2015 0525   CREATININE 1.15 12/17/2014 1707   CALCIUM 7.9* 01/27/2015 0525   GFRNONAA 5* 01/27/2015 0525   GFRNONAA 68 12/17/2014 1707   GFRAA 6* 01/27/2015 0525   GFRAA 79 12/17/2014 1707   CBC    Component Value Date/Time   WBC 8.0 01/27/2015 0525   RBC 2.76* 01/27/2015 0525   RBC 2.63* 01/26/2015 0333   HGB 8.3* 01/27/2015 0525   HCT 24.7* 01/27/2015 0525   PLT 258 01/27/2015 0525   MCV 89.5 01/27/2015 0525   MCH 30.1 01/27/2015 0525   MCHC 33.6 01/27/2015 0525   RDW  15.5 01/27/2015 0525   LYMPHSABS 1.0 01/21/2015 0312   MONOABS 1.1* 01/21/2015 0312   EOSABS 1.0* 01/21/2015 0312   BASOSABS 0.1 01/21/2015 0312     Assessment/Plan:  1. AoCKD, presumably due to septic shock related ATN- creatinine 1.15 on July 27 1. CVVHD from 8/20-  01/14/15 and transitioned to intermittent HD 01/16/15- done basically TIW- last on 9/5 2.  using temp HD cath -- Femoral placed 8/31 3. If remains afebrile over weekend and clearly needs more HD will plan for transition to [facility] later this week- continue to watch and follow UOP and labs  2. SIRS- clinical PE (RA and RV dilated, RV hypocontractile on ECHO). CT angio on hold for now (concerns for renal recovery as well as IV access).plan is long term anticoagulation  3. CAD- cath revealed occluded OM, cardiology following 4. Shock liver- improved.  5. S/p exploratory lap and fascia closure now s/p wound vac - with some bleeding, improved after holding anticoag, now back on anticoag.  6. Metabolic acidosis- improved 7. Hyponatremia. - improved 8. Thrombocytopenia- hit panel neg. Likely 2/2 to shocked liver. Now improved. Back on heparin - no bleeding currently.  9. Hypoalbuminemia- per primary svc 10. Nutrition: off TPN, taking some PO 11. Persistent Fevers: 8/30: temp HD cath removed.  Started Vanc.  B Cx x2 NGTD 12. Anemia- have added ESA - ferr over 1000 no iron    Lunette Tapp A

## 2015-01-27 NOTE — Progress Notes (Signed)
Jared [name] TEAM 1 - Stepdown/ICU TEAM Progress Note  Jared [name]:[id number] DOB: 1955-12-04 DOA: 01/08/2015 PCP: Nadean Corwin, MD  Admit HPI / Brief Narrative: 59 year old M Hx Depression, Anxiety, HTN, HLD, OSA, and Chronic Back Pain who presented 8/18 after being found down at home. Responsive to narcan via EMS. In ED was profoundly hypotensive with elevated WBC, refractory to volume. In ER noted to have lactate 8.5, hyperkalemia and started on pressors. PCCM admitted the pt.    Significant Events: 8/18 Admit - CCS, Cardiology consulted; brief PEA arrest; exploratory laparotomy with placement of wound vac; started heparin gtt for presumed PE 8/20 CRRT  8/21 OR for abd wound closure  8/22 Anuric. On CRRT. Off pressors. Improved Shock Liver. On fent gtt and heparing gtt. 40% fio2 8/23 ileus 8/23 Cardiac catheterization left circumflex, second obtuse marginal branch,Ost 2d Mrg-2d Mrg lesion or percent stenosed 8/25 HIT Ab screen normal - TNA initiated  8/26 Bleeding around abd wound - surface bled and IV heparin held  9/5 patient was transferred to telemetry floor , received HD on 9/5  HPI/Subjective: There has been no  vomiting. Complains of lower back pain.  Assessment/Plan:  Sepsis with presumed abdominal source > exp lap unrevealing  Vanc stopped 8/23 - Zosyn stopped 8/26 - temp up to 101 8/30 - Vanc resumed 8/30 - HD catheter removed 8/30 - new temp cath placed 8/31 right femoral - WBC has normalized - the patient is afebrile - follow up blood cultures negative 4 thus far - Vancomycin course now completed - nephrology to consider placement of tunneled catheter early this week  Clinically diagnosed PE RA and RV dliated w/ R heart failure on TTE - no CTa due to renal failure - long term anticoag to continue - venous duplex w/o evidence of DVT - follow hemoglobin - no further oozing at femoral cath .  Projectile vomiting Appears to have been an episodic issue - KUB  without evidence of obstruction/ileus - clinical exam reveals bowel sounds present - currently tolerating diet without difficulty  CAD Cardiac cath noted an occluded OM - Cardiology has evaluated and plans no further cardiac workup  Acute abdominal pain s/p laparotomy -wound VAC now discontinued - continue with wound care per general surgery recommendations  Altered mental status  -Appears to be multifactorial - mental status is slowly improving, but appears to have stalled at his current level - metabolic evaluation unrevealing  Acute renal failure/ATN with metab acidosis and hyperkalemia in CKD  - renal US neg  - CRRT started per renal 8/20 - stopped 01/15/15  - On HD since 01/16/15 - RF and pan autoimmune negative 01/14/15 - baseline creatinine 1.14 from 12/17/14 - Nephrology is following, no decision  about chronicity of hemodialysis yet,  plan is to continue watch and follow urine output and labs, decision regarding tunnelled dialysis catheter depends on depends on UOP and labs.  OSA -CPAP per respiratory, though patient is frequently refusing  Anemia/thrombocytopenia - Most likely multifactorial to include acute illness, recent surgery, and poor diet - HIT panel negative - platelet count is climbing  Shock liver -LFTs have nearly normalized  A- Fib -Remains in NSR - off amio since 01/11/15  Systolic CHF/RV dilation -volume management per HD   Pulmonary hypertension  Hyperglycemia  -CBG currently normal   Code Status: FULL Family Communication: None at bedside Disposition Plan: - continue PT/OT - await determination of chronicity of hemodialysis with possible need to place tunneled dialysis catheter  Consultants: PCCM Gen Surgery  [facility] Cardiology  Nephrology  Procedure/Significant Events: 8/18 Echo - EF 45 to 50%, mod RV dilation, mod/severe RV dysfx, PAS 47 mmHg 8/18 Renal u/s - b/l renal echogenicity  DVT prophylaxis: Heparin drip  Objective: Blood  pressure 157/72, pulse 71, temperature 98.8 F (37.1 C), temperature source Oral, resp. rate 16, weight 123.3 kg (271 lb 13.2 oz), SpO2 99 %.  Intake/Output Summary (Last 24 hours) at 01/27/15 1656 Last data filed at 01/27/15 0221  Gross per 24 hour  Intake    240 ml  Output   2000 ml  Net  -1760 ml   Exam: General: No acute respiratory distress - alert  Lungs: Clear to auscultation bilaterally without wheeze  Cardiovascular: Regular rate and rhythm without murmur  Abdomen: Large midline wound dressed and dry, faint bowel sounds, soft, no rebound  Extremities: No significant cyanosis or clubbing - 1+ B LE edema  Data Reviewed: Basic Metabolic Panel:  Recent Labs Lab 01/21/15 0312  01/22/15 0411 01/23/15 0715 01/23/15 0716 01/24/15 0445 01/25/15 0550 01/26/15 0333 01/27/15 0525  NA 129*  --  130*  --  129* 134* 134* 134* 137  K 4.2  --  3.2*  --  3.6 3.6 3.7 3.7 4.1  CL 97*  --  96*  --  97* 97* 98* 97* 100*  CO2 15*  --  23  --  18* 25 21* 23 27  GLUCOSE 103*  --  116*  --  78 86 80 99 105*  BUN 131*  --  80*  --  114* 61* 77* 87* 39*  CREATININE 15.16*  --  9.70*  --  14.49* 10.09* 12.61* 14.55* 9.33*  CALCIUM 8.1*  --  7.9*  --  8.2* 8.0* 8.0* 8.1* 7.9*  MG 2.2  --  1.8 2.0  --  2.0  --   --   --   PHOS  --   < > 4.7*  --  7.0* 4.7* 6.8* 7.3* 4.5  < > = values in this interval not displayed.   Liver Function Tests:  Recent Labs Lab 01/21/15 0312 01/22/15 0411 01/23/15 0716 01/24/15 0445 01/25/15 0550 01/26/15 0333 01/27/15 0525  AST 30 33  --  54* 45*  --  50*  ALT 115* 99*  --  84* 70*  --  65*  ALKPHOS 87 104  --  148* 138*  --  145*  BILITOT 3.1* 2.6*  --  2.1* 2.0*  --  1.6*  PROT 4.8* 5.0*  --  5.1* 5.1*  --  5.3*  ALBUMIN 1.9* 1.9* 1.8* 2.0*  2.0* 2.0*  2.0* 2.0* 2.0*  2.1*    CBC:  Recent Labs Lab 01/21/15 0312  01/23/15 0715 01/24/15 0445 01/25/15 0550 01/26/15 0333 01/27/15 0525  WBC 12.3*  < > 5.2 6.6 6.9 8.0 8.0  NEUTROABS 9.1*   --   --   --   --   --   --   HGB 9.6*  < > 8.1* 8.6* 8.2* 7.9* 8.3*  HCT 26.9*  < > 23.4* 23.7* 23.5* 23.2* 24.7*  MCV 87.3  < > 87.3 86.8 87.4 88.2 89.5  PLT 145*  < > 158 142* 170 246 258  < > = values in this interval not displayed.  CBG:  Recent Labs Lab 01/22/15 0611 01/22/15 1646 01/23/15 0856 01/23/15 1622 01/24/15 1544  GLUCAP 120* 102* 78 76 97    Recent Results (from the past 240 hour(s))  Culture,  blood (routine x 2)     Status: None   Collection Time: 01/20/15  3:48 PM  Result Value Ref Range Status   Specimen Description BLOOD PORTA CATH  Final   Special Requests BOTTLES DRAWN AEROBIC AND ANAEROBIC  Final   Culture NO GROWTH 5 DAYS  Final   Report Status 01/25/2015 FINAL  Final  Culture, blood (routine x 2)     Status: None   Collection Time: 01/20/15  4:25 PM  Result Value Ref Range Status   Specimen Description BLOOD PORTA CATH  Final   Special Requests BOTTLES DRAWN AEROBIC AND ANAEROBIC  Final   Culture NO GROWTH 5 DAYS  Final   Report Status 01/25/2015 FINAL  Final  Culture, blood (routine x 2)     Status: None   Collection Time: 01/22/15  3:25 PM  Result Value Ref Range Status   Specimen Description BLOOD LEFT ARM  Final   Special Requests BOTTLES DRAWN AEROBIC ONLY 5CC  Final   Culture NO GROWTH 5 DAYS  Final   Report Status 01/27/2015 FINAL  Final  Culture, blood (routine x 2)     Status: None   Collection Time: 01/22/15  3:30 PM  Result Value Ref Range Status   Specimen Description BLOOD RIGHT ARM  Final   Special Requests BOTTLES DRAWN AEROBIC ONLY 2CC  Final   Culture NO GROWTH 5 DAYS  Final   Report Status 01/27/2015 FINAL  Final  Culture, Urine     Status: None   Collection Time: 01/22/15  6:03 PM  Result Value Ref Range Status   Specimen Description URINE, CATHETERIZED  Final   Special Requests NONE  Final   Culture NO GROWTH 1 DAY  Final   Report Status 01/23/2015 FINAL  Final       Scheduled Meds:  Scheduled Meds: .  antiseptic oral rinse  7 mL Mouth Rinse QID  . aspirin  81 mg Oral Daily  . chlorhexidine gluconate  15 mL Mouth Rinse BID  . darbepoetin (ARANESP) injection - DIALYSIS  100 mcg Intravenous Q Mon-HD  . feeding supplement (NEPRO CARB STEADY)  237 mL Oral BID BM  . heparin flush  20 Units Intracatheter Q12H  . multivitamin with minerals  1 tablet Oral Daily  . pantoprazole  40 mg Oral QHS  . sodium chloride  10 mL Intravenous Q12H    Time spent on care of this patient: 30 mins  Huey Bienenstock, MD [phone] [facility] For Consults/Admissions - Flow Manager - [phone] Office  [phone]  Contact MD directly via text page:      [email or web address]      password [facility]  01/27/2015, 4:56 PM   LOS: 19 days

## 2015-01-27 NOTE — Care Management Important Message (Signed)
Important Message  Patient Details  Name: Jared [name] MRN: [id number] Date of Birth: 1956/04/29   Medicare Important Message Given:  Yes-fourth notification given    Orson Aloe 01/27/2015, 12:09 PM

## 2015-01-28 ENCOUNTER — Inpatient Hospital Stay (HOSPITAL_COMMUNITY): Payer: Medicare Other

## 2015-01-28 LAB — HEPARIN LEVEL (UNFRACTIONATED)
HEPARIN UNFRACTIONATED: 0.34 [IU]/mL (ref 0.30–0.70)
Heparin Unfractionated: 0.19 IU/mL — ABNORMAL LOW (ref 0.30–0.70)
Heparin Unfractionated: 0.5 IU/mL (ref 0.30–0.70)

## 2015-01-28 LAB — CBC
HEMATOCRIT: 23.4 % — AB (ref 39.0–52.0)
HEMOGLOBIN: 7.7 g/dL — AB (ref 13.0–17.0)
MCH: 29.8 pg (ref 26.0–34.0)
MCHC: 32.9 g/dL (ref 30.0–36.0)
MCV: 90.7 fL (ref 78.0–100.0)
Platelets: 284 10*3/uL (ref 150–400)
RBC: 2.58 MIL/uL — ABNORMAL LOW (ref 4.22–5.81)
RDW: 15.6 % — ABNORMAL HIGH (ref 11.5–15.5)
WBC: 7.8 10*3/uL (ref 4.0–10.5)

## 2015-01-28 LAB — RENAL FUNCTION PANEL
ALBUMIN: 1.9 g/dL — AB (ref 3.5–5.0)
ANION GAP: 9 (ref 5–15)
BUN: 51 mg/dL — ABNORMAL HIGH (ref 6–20)
CO2: 26 mmol/L (ref 22–32)
Calcium: 7.8 mg/dL — ABNORMAL LOW (ref 8.9–10.3)
Chloride: 98 mmol/L — ABNORMAL LOW (ref 101–111)
Creatinine, Ser: 11.42 mg/dL — ABNORMAL HIGH (ref 0.61–1.24)
GFR calc Af Amer: 5 mL/min — ABNORMAL LOW (ref >60)
GFR, EST NON AFRICAN AMERICAN: 4 mL/min — AB (ref >60)
Glucose, Bld: 103 mg/dL — ABNORMAL HIGH (ref 65–99)
PHOSPHORUS: 5 mg/dL — AB (ref 2.5–4.6)
POTASSIUM: 3.8 mmol/L (ref 3.5–5.1)
Sodium: 133 mmol/L — ABNORMAL LOW (ref 135–145)

## 2015-01-28 MED ORDER — TECHNETIUM TO 99M ALBUMIN AGGREGATED
6.0000 | Freq: Once | INTRAVENOUS | Status: AC | PRN
  Administered 2015-01-28: 6 via INTRAVENOUS

## 2015-01-28 MED ORDER — GUAIFENESIN ER 600 MG PO TB12
600.0000 mg | ORAL_TABLET | Freq: Two times a day (BID) | ORAL | Status: DC | PRN
  Administered 2015-01-28 – 2015-01-29 (×2): 600 mg via ORAL
  Filled 2015-01-28 (×3): qty 1

## 2015-01-28 MED ORDER — TECHNETIUM TC 99M DIETHYLENETRIAME-PENTAACETIC ACID: 41.0000 | Freq: Once | INTRAVENOUS | Status: DC | PRN

## 2015-01-28 NOTE — Progress Notes (Addendum)
PCCM and [place] TEAM 1 TRANSFER Progress Note  Jared [name]:[id number] DOB: December 11, 1955 DOA: 01/08/2015 PCP: Nadean Corwin, MD  Admit HPI / Brief Narrative: 59 year old M Hx Depression, Anxiety, HTN, HLD, OSA, and Chronic Back Pain who presented 8/18 after being found down at home. Responsive to narcan via EMS. In ED was profoundly hypotensive with elevated WBC, refractory to volume. In ER noted to have lactate 8.5, hyperkalemia and started on pressors. PCCM admitted the pt.    Significant Events: 8/18 Admit - CCS, Cardiology consulted; brief PEA arrest; exploratory laparotomy with placement of wound vac; started heparin gtt for presumed PE 8/20 CRRT  8/21 OR for abd wound closure  8/22 Anuric. On CRRT. Off pressors. Improved Shock Liver. On fent gtt and heparing gtt. 40% fio2 8/23 ileus 8/23 Cardiac catheterization left circumflex, second obtuse marginal branch,Ost 2d Mrg-2d Mrg lesion or percent stenosed 8/25 HIT Ab screen normal - TNA initiated  8/26 Bleeding around abd wound - surface bled and IV heparin held  9/5 patient was transferred to telemetry floor , received HD on 9/5  HPI/Subjective: There has been no  vomiting. Complains of lower back pain.  Assessment/Plan:  Sepsis with presumed abdominal source > exp lap unrevealing -improved, source unclear  Vanc stopped 8/23 - Zosyn stopped 8/26 - temp up to 101 8/30 - Vanc resumed 8/30 - HD catheter removed 8/30 - new temp cath placed 8/31 right femoral  - WBC has normalized - the patient is afebrile  - follow up blood cultures negative 4 thus far - nephrology considering placement of tunneled catheter tomorrow  Suspected PE RA and RV dliated w/ R heart failure on TTE  - no CTA due to renal failure - - venous duplex w/o evidence of DVT  -on IV heparin -check VQ scan.  Projectile vomiting - KUB without evidence of obstruction/ileus - clinical exam reveals bowel sounds present - currently tolerating diet  without difficulty  CAD Cardiac cath noted an occluded OM - Cardiology has evaluated and plans no further cardiac workup  Acute abdominal pain s/p laparotomy -wound VAC now discontinued - continue with wound care per general surgery recommendations  Altered mental status  -Appears to be multifactorial  -resolved  Acute renal failure/ATN with metab acidosis and hyperkalemia in CKD  -ATN from sepsis - renal US neg  - CRRT started per renal 8/20 - stopped 01/15/15  - On HD since 01/16/15, last HD 9/5 - RF and pan autoimmune negative 01/14/15 - baseline creatinine 1.14 from 12/17/14 - per Nephrology, plan for [facility] tomorrow and resume HD  OSA -CPAP per respiratory, though patient is frequently refusing  Anemia/thrombocytopenia - Most likely multifactorial to include acute illness, sepsis - HIT panel negative - platelet count is climbing  Shock liver -LFTs have nearly normalized  A- Fib -Remains in NSR - off amio since 01/11/15  Systolic CHF/RV dilation -volume management per HD   Pulmonary hypertension  Hyperglycemia  -CBG currently normal   Code Status: FULL Family Communication: None at bedside Disposition Plan: - continue PT/OT - await determination of chronicity of hemodialysis with possible need to place tunneled dialysis catheter  Consultants: PCCM Gen Surgery  [facility] Cardiology  Nephrology  Procedure/Significant Events: 8/18 Echo - EF 45 to 50%, mod RV dilation, mod/severe RV dysfx, PAS 47 mmHg 8/18 Renal u/s - b/l renal echogenicity  DVT prophylaxis: Heparin drip  Objective: Blood pressure 154/70, pulse 64, temperature 98.8 F (37.1 C), temperature source Oral, resp. rate 16, weight  Laurey Morale[place] Viviann Spare EXTTAG>Viviann Spare  Final   Special Requests BOTTLES DRAWN AEROBIC AND ANAEROBIC  Final   Culture NO GROWTH 5 DAYS  Final   Report Status 01/25/2015 FINAL  Final  Culture, blood (routine x 2)     Status: None   Collection Time: 01/22/15  3:25 PM  Result Value Ref Range Status   Specimen Description BLOOD LEFT ARM  Final   Special Requests BOTTLES DRAWN AEROBIC ONLY 5CC  Final   Culture NO GROWTH 5 DAYS  Final   Report Status 01/27/2015 FINAL  Final  Culture, blood (routine x 2)     Status: None   Collection Time: 01/22/15  3:30 PM  Result Value Ref Range Status   Specimen Description BLOOD RIGHT ARM  Final   Special Requests BOTTLES DRAWN AEROBIC ONLY 2CC  Final   Culture NO GROWTH 5 DAYS  Final   Report Status 01/27/2015 FINAL  Final  Culture, Urine     Status: None   Collection Time: 01/22/15  6:03 PM  Result Value Ref Range Status   Specimen Description URINE, CATHETERIZED  Final   Special Requests NONE  Final   Culture NO GROWTH 1 DAY  Final   Report Status 01/23/2015 FINAL  Final       Scheduled Meds:  Scheduled Meds: . antiseptic oral rinse  7 mL Mouth Rinse QID  . aspirin  81 mg Oral Daily  . chlorhexidine gluconate  15 mL Mouth Rinse BID  . darbepoetin (ARANESP) injection - DIALYSIS  100 mcg Intravenous Q Mon-HD  . feeding supplement (NEPRO CARB STEADY)  237 mL Oral BID BM  . heparin flush  20 Units Intracatheter Q12H  . multivitamin with minerals  1 tablet Oral Daily  . pantoprazole  40 mg Oral QHS  . sodium chloride  10 mL Intravenous Q12H    Time spent on care of this patient: 30 mins  Zannie Cove [phone] [facility] For Consults/Admissions - Flow Manager - [phone] Office  [phone]  Contact MD directly via text page:      [email or web address]      password [facility]  01/28/2015, 11:17 AM   LOS: 20 days

## 2015-01-28 NOTE — Progress Notes (Signed)
ANTICOAGULATION  - Follow Up Consult  Pharmacy Consult for Heparin Indication: pulmonary embolus  Allergies  Allergen Reactions  . Morphine And Related Anaphylaxis and Shortness Of Breath  . Montelukast Other (See Comments)    unknown  . Neurontin [Gabapentin] Other (See Comments)    delusional  . Wellbutrin [Bupropion] Palpitations    Insomnia    Patient Measurements: Weight: 272 lb 1.5 oz (123.42 kg)  IBW: 83 kg Heparin Dosing Weight: 111.8 kg  Vital Signs: Temp: 98.8 F (37.1 C) (09/07 1000) Temp Source: Oral (09/07 1000) BP: 154/70 mmHg (09/07 1000) Pulse Rate: 64 (09/07 1000)  Labs:  Recent Labs  01/26/15 0333 01/27/15 0525 01/28/15 0445 01/28/15 1315  HGB 7.9* 8.3* 7.7*  --   HCT 23.2* 24.7* 23.4*  --   PLT 246 258 284  --   HEPARINUNFRC 0.53 0.38 0.19* 0.34  CREATININE 14.55* 9.33* 11.42*  --     Estimated Creatinine Clearance: 9.6 mL/min (by C-G formula based on Cr of 11.42).   Medications:  Heparin @ 2300 units/hr  Assessment: 59 YOM with RV failure and presumed PE on heparin for anticoagulation. Heparin level this afternoon is therapeutic at 0.34 units/mL. Hgb continues to be low but relatively stable- he has been started on Aranesp with HD. No bleeding noted.  Patient to have VQ scan to determine probability of PE this afternoon.  Tunneled cath to be placed by IR tomorrow- per notes, heparin to be turned off 3h prior to procedure.  Goal of Therapy:  Heparin level 0.3-0.7 units/ml Monitor platelets by anticoagulation protocol: Yes   Plan:  - continue heparin at 2300 units/hr (23 ml/hr) - confirmatory level in 8 hours - daily HL and CBC - follow up results of VQ scan - follow timing of IR procedure and appropriate stopping of IV heparin - follow for s/s bleeding  Dannica Bickham D. Anshi Jalloh, PharmD, BCPS Clinical Pharmacist Pager: [phone] 01/28/2015 2:27 PM

## 2015-01-28 NOTE — Progress Notes (Signed)
Patient ID: Jared [name], male   DOB: 11-16-1955, 59 y.o.   MRN: [id number]  S:  Had 600 UOP recorded in one shift but BUN and creatinine up convincingly without HD better converstaion -- tells me that he had AKI several years ago at [facility] hospital but did not require HD   O:BP 159/69 mmHg  Pulse 70  Temp(Src) 99.2 F (37.3 C) (Oral)  Resp 20  Wt 123.42 kg (272 lb 1.5 oz)  SpO2 97%  Intake/Output Summary (Last 24 hours) at 01/28/15 1020 Last data filed at 01/28/15 0846  Gross per 24 hour  Intake   1488 ml  Output    600 ml  Net    888 ml   Intake/Output: I/O last 3 completed shifts: In: 1708 [P.O.:720; I.V.:988] Out: 2600 [Urine:600; Other:2000]  Intake/Output this shift:  Total I/O In: 20 [I.V.:20] Out: -  Weight change: -5.78 kg (-12 lb 11.9 oz) Gen: pleasant male, lying in bed, Heart: RRR, no m/r/g Resp:bibasilar crackles.  Abd:+BS, binder in place, tender to palpation EXT: trace - 1+ edema Neuro: awake today,  Clearer by the day   Recent Labs Lab 01/22/15 0411 01/23/15 0716 01/24/15 0445 01/25/15 0550 01/26/15 0333 01/27/15 0525 01/28/15 0445  NA 130* 129* 134* 134* 134* 137 133*  K 3.2* 3.6 3.6 3.7 3.7 4.1 3.8  CL 96* 97* 97* 98* 97* 100* 98*  CO2 23 18* 25 21* GLUCOSE 116* 78 86 80 99 105* 103*  BUN 80* 114* 61* 77* 87* 39* 51*  CREATININE 9.70* 14.49* 10.09* 12.61* 14.55* 9.33* 11.42*  ALBUMIN 1.9* 1.8* 2.0*  2.0* 2.0*  2.0* 2.0* 2.0*  2.1* 1.9*  CALCIUM 7.9* 8.2* 8.0* 8.0* 8.1* 7.9* 7.8*  PHOS 4.7* 7.0* 4.7* 6.8* 7.3* 4.5 5.0*  AST 33  --  54* 45*  --  50*  --   ALT 99*  --  84* 70*  --  65*  --    Liver Function Tests:  Recent Labs Lab 01/24/15 0445 01/25/15 0550 01/26/15 0333 01/27/15 0525 01/28/15 0445  AST 54* 45*  --  50*  --   ALT 84* 70*  --  65*  --   ALKPHOS 148* 138*  --  145*  --   BILITOT 2.1* 2.0*  --  1.6*  --   PROT 5.1* 5.1*  --  5.3*  --   ALBUMIN 2.0*  2.0* 2.0*  2.0* 2.0* 2.0*  2.1* 1.9*   No results  for input(s): LIPASE, AMYLASE in the last 168 hours.  Recent Labs Lab 01/22/15 1743 01/26/15 0333  AMMONIA 33 38*   CBC:  Recent Labs Lab 01/24/15 0445 01/25/15 0550 01/26/15 0333 01/27/15 0525 01/28/15 0445  WBC 6.6 6.9 8.0 8.0 7.8  HGB 8.6* 8.2* 7.9* 8.3* 7.7*  HCT 23.7* 23.5* 23.2* 24.7* 23.4*  MCV 86.8 87.4 88.2 89.5 90.7  PLT 142* 170 246 258 284   Cardiac Enzymes: No results for input(s): CKTOTAL, CKMB, CKMBINDEX, TROPONINI in the last 168 hours. CBG:  Recent Labs Lab 01/22/15 0611 01/22/15 1646 01/23/15 0856 01/23/15 1622 01/24/15 1544  GLUCAP 120* 102* 78 76 97    Iron Studies:   Recent Labs  01/26/15 0333  IRON 26*  TIBC 209*  FERRITIN 1097*   Studies/Results: No results found. Marland Kitchen antiseptic oral rinse  7 mL Mouth Rinse QID  . aspirin  81 mg Oral Daily  . chlorhexidine gluconate  15 mL Mouth Rinse BID  . darbepoetin (  ARANESP) injection - DIALYSIS  100 mcg Intravenous Q Mon-HD  . feeding supplement (NEPRO CARB STEADY)  237 mL Oral BID BM  . heparin flush  20 Units Intracatheter Q12H  . multivitamin with minerals  1 tablet Oral Daily  . pantoprazole  40 mg Oral QHS  . sodium chloride  10 mL Intravenous Q12H    BMET    Component Value Date/Time   NA 133* 01/28/2015 0445   K 3.8 01/28/2015 0445   CL 98* 01/28/2015 0445   CO2 26 01/28/2015 0445   GLUCOSE 103* 01/28/2015 0445   BUN 51* 01/28/2015 0445   CREATININE 11.42* 01/28/2015 0445   CREATININE 1.15 12/17/2014 1707   CALCIUM 7.8* 01/28/2015 0445   GFRNONAA 4* 01/28/2015 0445   GFRNONAA 68 12/17/2014 1707   GFRAA 5* 01/28/2015 0445   GFRAA 79 12/17/2014 1707   CBC    Component Value Date/Time   WBC 7.8 01/28/2015 0445   RBC 2.58* 01/28/2015 0445   RBC 2.63* 01/26/2015 0333   HGB 7.7* 01/28/2015 0445   HCT 23.4* 01/28/2015 0445   PLT 284 01/28/2015 0445   MCV 90.7 01/28/2015 0445   MCH 29.8 01/28/2015 0445   MCHC 32.9 01/28/2015 0445   RDW 15.6* 01/28/2015 0445   LYMPHSABS  1.0 01/21/2015 0312   MONOABS 1.1* 01/21/2015 0312   EOSABS 1.0* 01/21/2015 0312   BASOSABS 0.1 01/21/2015 0312     Assessment/Plan:  1. AoCKD, presumably due to septic shock related ATN- creatinine 1.15 on July 27 1. CVVHD from 8/20-  01/14/15 and transitioned to intermittent HD 01/16/15- done basically TIW- last on 9/5 2.  using temp HD cath -- Femoral placed 8/31 3. I think it seems he will clearly need more HD - HD dependent since 8/20 almost 3 weeks- will ask for for [facility] maybe tomorrow also will likely need HD tomorrow-  Will also need to CLIP to OP unit- ? Hold off on perm access given normal renal function in late July   2. SIRS- clinical PE (RA and RV dilated, RV hypocontractile on ECHO). CT angio on hold for now (concerns for renal recovery as well as IV access).plan is long term anticoagulation - once gets [facility] can be started on coumadin 3. CAD- cath revealed occluded OM, cardiology following 4. Shock liver- improved.  5. S/p exploratory lap and fascia closure now s/p wound vac - with some bleeding, improved after holding anticoag, now back on anticoag.  6. Metabolic acidosis- improved 7. Hyponatremia. - improved 8. Thrombocytopenia- hit panel neg. Likely 2/2 to shocked liver. Now improved. Back on heparin - no bleeding currently.  9. Hypoalbuminemia- per primary svc 10. Nutrition: off TPN, taking some PO 11. Persistent Fevers: 8/30: temp HD cath removed.  Started Vanc.  B Cx x2 NGTD- resolved  12. Anemia- have added ESA - ferr over 1000 no iron - will give blood with HD tomorrow as well    Lakshmi Sundeen A

## 2015-01-28 NOTE — Progress Notes (Signed)
PT Cancellation Note  Patient Details Name: Jared [name] MRN: [id number] DOB: [birth year]   Cancelled Treatment:    Reason Eval/Treat Not Completed: Patient at procedure or test/unavailable   Noted also for tunneled HD catheter placement tomorrow;   Will plan to Re-eval for mobility after perm cath placement, and will update dc recommendations as needed;  Thanks,   Van Clines, PT  Acute Rehabilitation Services Pager [phone] Office [phone]    Van Clines [facility] 01/28/2015, 3:58 PM

## 2015-01-28 NOTE — Progress Notes (Signed)
ANTICOAGULATION  - Follow Up Consult  Pharmacy Consult for Heparin Indication: pulmonary embolus  Allergies  Allergen Reactions  . Morphine And Related Anaphylaxis and Shortness Of Breath  . Montelukast Other (See Comments)    unknown  . Neurontin [Gabapentin] Other (See Comments)    delusional  . Wellbutrin [Bupropion] Palpitations    Insomnia    Patient Measurements: Weight: 275 lb 11.3 oz (125.06 kg)  IBW: 83 kg Heparin Dosing Weight: 111.8 kg  Vital Signs: Temp: 99.7 F (37.6 C) (09/07 2040) Temp Source: Oral (09/07 2040) BP: 161/68 mmHg (09/07 2040) Pulse Rate: 72 (09/07 2040)  Labs:  Recent Labs  01/26/15 0333 01/27/15 0525 01/28/15 0445 01/28/15 1315 01/28/15 2240  HGB 7.9* 8.3* 7.7*  --   --   HCT 23.2* 24.7* 23.4*  --   --   PLT 246 258 284  --   --   HEPARINUNFRC 0.53 0.38 0.19* 0.34 0.50  CREATININE 14.55* 9.33* 11.42*  --   --     Estimated Creatinine Clearance: 9.6 mL/min (by C-G formula based on Cr of 11.42).   Medications:  Heparin @ 2300 units/hr  Assessment: 59 YOM with RV failure and presumed PE on heparin for anticoagulation. Heparin level remains therapeutic on 2300 units/hr. Hgb continues to be low but relatively stable- he has been started on Aranesp with HD. No bleeding noted.  VQ scan shows "No ventilation-perfusion mismatch to suggest pulmonary embolism."  Tunneled cath to be placed by IR tomorrow- per notes, heparin to be turned off 3h prior to procedure.  Goal of Therapy:  Heparin level 0.3-0.7 units/ml Monitor platelets by anticoagulation protocol: Yes   Plan:  - Continue heparin at 2300 units/hr (23 ml/hr) - Daily HL and CBC - Will f/u with MD tomorrow to see if heparin still warranted as VQ scan does not suggest PE  Christoper Fabian, PharmD, BCPS Clinical pharmacist, pager [phone] 01/28/2015 11:34 PM

## 2015-01-28 NOTE — Progress Notes (Signed)
Noted continued Acute Renal issues and determining need for perm access and monitoring. Pt unable to do therapy at this time. We will sign off. Please reconsult  [place] if pt becomes appropriate for considering an inpt rehab admission. [phone]

## 2015-01-28 NOTE — Progress Notes (Signed)
ANTICOAGULATION  - Follow Up Consult  Pharmacy Consult for Heparin Indication: pulmonary embolus  Allergies  Allergen Reactions  . Morphine And Related Anaphylaxis and Shortness Of Breath  . Montelukast Other (See Comments)    unknown  . Neurontin [Gabapentin] Other (See Comments)    delusional  . Wellbutrin [Bupropion] Palpitations    Insomnia    Patient Measurements: Weight: 272 lb 1.5 oz (123.42 kg)  IBW: 83 kg Heparin Dosing Weight: 111.8 kg  Vital Signs: Temp: 99.2 F (37.3 C) (09/07 0433) Temp Source: Oral (09/07 0433) BP: 159/69 mmHg (09/07 0433) Pulse Rate: 70 (09/07 0433)  Labs:  Recent Labs  01/25/15 0550 01/26/15 0333 01/27/15 0525 01/28/15 0445  HGB 8.2* 7.9* 8.3* 7.7*  HCT 23.5* 23.2* 24.7* 23.4*  PLT 170 246 258 284  HEPARINUNFRC 0.37 0.53 0.38 0.19*  CREATININE 12.61* 14.55* 9.33*  --     Estimated Creatinine Clearance: 11.7 mL/min (by C-G formula based on Cr of 9.33).   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 Heparin level this morning subtherapeutic (0.19). Hgb continues to be low but relatively stable. No bleeding noted.  Goal of Therapy:  Heparin level 0.3-0.7 units/ml Monitor platelets by anticoagulation protocol: Yes   Plan:  1. Increase heparin to 2300 units/hr (23 ml/hr) 2. Will continue to monitor for any signs/symptoms of bleeding and will follow up with heparin level in 8 hours  Christoper Fabian, PharmD, BCPS Clinical pharmacist, pager [phone] 01/28/2015 5:37 AM

## 2015-01-28 NOTE — Consult Note (Signed)
Chief Complaint: Patient was seen in consultation today for worsening renal function Chief Complaint  Patient presents with  . Respiratory Distress   at the request of Dr Kathrene Bongo  Referring Physician(s): Dr Kathrene Bongo  History of Present Illness: Jared [name] is a 59 y.o. male   Pt was found down at home 8/18 Sepsis; cardiac arrest Hyperkalemia --- temp Rt femoral HD cath was placed in ED (Removed 8/30 and new one placed) Exp lap for what appeared to be ischemic bowel No bowel ischemia---wound vac placed Acute tubular necrosis secondary sepsis Worsening renal function Request now for tunneled hemodialysis catheter placement I have seen and examined pt  Past Medical History  Diagnosis Date  . Chest pain   . HTN (hypertension)   . Hyperlipemia   . DJD (degenerative joint disease)   . BPH (benign prostatic hypertrophy)   . Obesity   . OSA (obstructive sleep apnea)   . Hypercholesteremia   . Chronic back pain   . Anxiety   . Depression     Past Surgical History  Procedure Laterality Date  . Back surgery    . Prostatectomy    . Cervical spine surgery    . Laparotomy N/A 01/08/2015    Procedure: EXPLORATORY LAPAROTOMY;  Surgeon: Manus Rudd, MD;  Location: [facility] OR;  Service: General;  Laterality: N/A;  . Application of wound vac N/A 01/08/2015    Procedure: APPLICATION OF WOUND VAC;  Surgeon: Manus Rudd, MD;  Location: [facility] OR;  Service: General;  Laterality: N/A;  . Wound debridement N/A 01/11/2015    Procedure: DEBRIDEMENT WOUND AND VAC DRESSING CHANGE;  Surgeon: Manus Rudd, MD;  Location: [facility] OR;  Service: General;  Laterality: N/A;  . Cardiac catheterization N/A 01/13/2015    Procedure: Left Heart Cath and Coronary Angiography;  Surgeon: Corky Crafts, MD;  Location: [facility] INVASIVE CV LAB;  Service: Cardiovascular;  Laterality: N/A;    Allergies: Morphine and related; Montelukast; Neurontin; and Wellbutrin  Medications: Prior to Admission  medications   Medication Sig Start Date End Date Taking? Authorizing Provider  albuterol (PROVENTIL HFA;VENTOLIN HFA) 108 (90 BASE) MCG/ACT inhaler Inhale 2 puffs into the lungs every 2 (two) hours as needed for wheezing or shortness of breath (cough). 12/17/14  Yes Courtney Forcucci, PA-C  ALPRAZolam (XANAX) 1 MG tablet TAKE 1/2-1 TABLET BY MOUTH 3 TIMES DAILY 01/02/15  Yes Quentin Mulling, PA-C  atorvastatin (LIPITOR) 40 MG tablet TAKE 1 TABLET BY MOUTH DAILY OR AS DIRECTED FOR CHOLESTEROL Patient taking differently: TAKE 1 TABLET BY MOUTH every evening OR AS DIRECTED FOR CHOLESTEROL 10/08/14  Yes Lucky Cowboy, MD  bisoprolol-hydrochlorothiazide [facility]) 10-6.25 MG per tablet Take 1 tablet by mouth daily.   Yes Historical Provider, MD  Cholecalciferol (VITAMIN D-3) 5000 UNITS TABS Take 2,000 Units by mouth daily.    Yes Historical Provider, MD  citalopram (CELEXA) 40 MG tablet TAKE 1 TABLET BY MOUTH EVERY DAY FOR MOOD 09/11/14  Yes Lucky Cowboy, MD  docusate sodium (COLACE) 100 MG capsule Take 100 mg by mouth daily as needed for mild constipation.    Yes Historical Provider, MD  Flaxseed, Linseed, (FLAXSEED OIL PO) Take 1 tablet by mouth daily.     Yes Historical Provider, MD  fluticasone (FLONASE) 50 MCG/ACT nasal spray USE 1 TO 2 SPRAYS IN EACHNOSTRIL ONCE DAILY Patient taking differently: USE 1 TO 2 SPRAYS IN EACHNOSTRIL ONCE DAILY as needed 10/21/14  Yes Lucky Cowboy, MD  losartan-hydrochlorothiazide (HYZAAR) 100-25 MG per tablet TAKE 1  TABLET BY MOUTH EVERY DAY 09/11/14  Yes Lucky Cowboy, MD  minoxidil (LONITEN) 10 MG tablet Take 1 tablet daily or as directed for BP Patient taking differently: Take 5 mg by mouth at bedtime.  03/23/14  Yes Lucky Cowboy, MD  Omega-3 Fatty Acids (FISH OIL BURP-LESS PO) Take 1 tablet by mouth daily.     Yes Historical Provider, MD  oxycodone (ROXICODONE) 30 MG immediate release tablet Take 60 mg by mouth every 4 (four) hours as needed for pain.  01/06/15  02/05/15 Yes Historical Provider, MD  pregabalin (LYRICA) 75 MG capsule Take 1 to 2 capsules 1 hour before sleep 01/05/15 02/05/15 Yes Lucky Cowboy, MD  senna (SENOKOT) 8.6 MG TABS tablet Take 4 tablets by mouth at bedtime.   Yes Historical Provider, MD  azelastine (ASTELIN) 0.1 % nasal spray 1 to 2 sprays each nostril every 12 hours Patient taking differently: Place 1-2 sprays into both nostrils 2 (two) times daily as needed for allergies. 1 to 2 sprays each nostril every 12 hours 10/07/13 10/08/14  Lucky Cowboy, MD  ondansetron (ZOFRAN) 8 MG tablet 1/2 to 1 tablet up to 3 x daily as needed for nausea. 07/15/13   Lucky Cowboy, MD  testosterone cypionate (DEPOTESTOTERONE CYPIONATE) 200 MG/ML injection Inject 2 cc IM every 2 weeks or as directed by a physician. Patient taking differently: Inject into the muscle every 28 (twenty-eight) days. Inject 2 cc IM every monthly or as directed by a physician. 08/25/14   Lucky Cowboy, MD     History reviewed. No pertinent family history.  Social History   Social History  . Marital Status: Married    Spouse Name: N/A  . Number of Children: N/A  . Years of Education: N/A   Social History Main Topics  . Smoking status: Former Games developer  . Smokeless tobacco: None  . Alcohol Use: No     Comment: rarely  . Drug Use: No  . Sexual Activity: Not Asked   Other Topics Concern  . None   Social History Narrative    Review of Systems: A 12 point ROS discussed and pertinent positives are indicated in the HPI above.  All other systems are negative.  Review of Systems  Constitutional: Positive for activity change and appetite change.  Respiratory: Negative for cough and shortness of breath.   Gastrointestinal: Positive for abdominal pain.  Neurological: Positive for weakness.  Psychiatric/Behavioral: Negative for behavioral problems and confusion.    Vital Signs: BP 154/70 mmHg  Pulse 64  Temp(Src) 98.8 F (37.1 C) (Oral)  Resp 16  Wt 272 lb  1.5 oz (123.42 kg)  SpO2 98%  Physical Exam  Constitutional: He is oriented to person, place, and time. He appears well-nourished.  Cardiovascular: Normal rate and regular rhythm.   Pulmonary/Chest: Effort normal and breath sounds normal.  Abdominal: Soft. There is tenderness.  Musculoskeletal: Normal range of motion.  Neurological: He is alert and oriented to person, place, and time.  Skin: Skin is warm and dry.  Psychiatric: He has a normal mood and affect. His behavior is normal. Judgment and thought content normal.  Nursing note and vitals reviewed.   Mallampati Score:  MD Evaluation Airway: WNL Airway comments: Intubated Heart: WNL Abdomen: WNL Abdomen comments: Wound vac Chest/ Lungs: WNL ASA  Classification: 3 Mallampati/Airway Score: One  Imaging: US Renal Port  01/08/2015   CLINICAL DATA:  Acute renal failure.  EXAM: RENAL / URINARY TRACT ULTRASOUND COMPLETE  COMPARISON:  05/16/2012  FINDINGS: Right Kidney:  Length: 12.3 cm. The right kidney is more echogenic than the adjacent liver. Poor corticomedullary differentiation. No stones, mass, or hydronephrosis.  Left Kidney:  Length: 12.3 cm. Mildly echogenic left kidney. Poor corticomedullary differentiation. No stones, mass, or hydronephrosis identified. Poor delineation of the upper pole of the kidney due to poor sonic window.  Bladder:  Empty; Foley catheter in place.  IMPRESSION: 1. Bilateral renal echogenicity with relatively normal renal size, and size not appreciably less than on 05/16/2012. Appearance could be from chronic medical renal disease, or late acute or chronic glomerulonephritis. Renal biopsy can help in further workup if clinically warranted.   Electronically Signed   By: Gaylyn Rong M.D.   On: 01/08/2015 17:12   Dg Chest Port 1 View  01/22/2015   CLINICAL DATA:  Altered mental status and fever evaluate for pneumonia.  EXAM: PORTABLE CHEST - 1 VIEW  COMPARISON:  01/18/2015  FINDINGS: There is a left IJ  central line, tip at the upper SVC. Right IJ central line has been removed.  Stable cardiopericardial enlargement and vascular pedicle widening.  Hypoventilation with indistinct bibasilar opacities. In general, basilar aeration has improved. No edema, effusion or air leak.  IMPRESSION: Continued improvement in lung aeration since extubation but persistent bibasilar opacity. Cannot exclude pneumonia as cause of fever.   Electronically Signed   By: Marnee Spring M.D.   On: 01/22/2015 15:49   Dg Chest Port 1 View  01/18/2015   CLINICAL DATA:  Endotracheal tube.  Shortness of breath.  EXAM: PORTABLE CHEST - 1 VIEW  COMPARISON:  1 day prior  FINDINGS: Right internal jugular Cordis sheath unchanged. Lower cervical spine fixation. Left internal jugular line tip at high SVC. Endotracheal and nasogastric tubes have been removed. Cardiomegaly accentuated by AP portable technique. Improved to resolved pleural effusions. No pneumothorax. No congestive failure. Improved bibasilar airspace disease.  IMPRESSION: Improved aeration with decreased to resolved pleural effusions and decreased bibasilar atelectasis.  Cardiomegaly without congestive failure.  Extubation and removal of nasogastric tube.   Electronically Signed   By: Jeronimo Greaves M.D.   On: 01/18/2015 09:35   Dg Chest Port 1 View  01/17/2015   CLINICAL DATA:  Intubated patient.  EXAM: PORTABLE CHEST - 1 VIEW  COMPARISON:  01/15/2015.  FINDINGS: 0538 hours. Right IJ central venous catheter, left IJ central venous catheter, endotracheal tube and nasogastric tube appear unchanged. The nasogastric tube tip is not visualized. Bibasilar airspace opacities and probable bilateral pleural effusions are unchanged. There is no pneumothorax or definite edema. The heart remains enlarged. Postsurgical changes are noted status post lower cervical fusion.  IMPRESSION: Stable chest with persistent bibasilar airspace opacities and probable pleural effusions.   Electronically Signed    By: Carey Bullocks M.D.   On: 01/17/2015 09:30   Dg Chest Port 1 View  01/15/2015   CLINICAL DATA:  Repositioned endotracheal tube.  EXAM: PORTABLE CHEST - 1 VIEW  COMPARISON:  Earlier today.  FINDINGS: Stable enlarged cardiac silhouette. Endotracheal tube in satisfactory position. Bilateral jugular catheters are unchanged. Nasogastric tube extending into the stomach. Stable bibasilar opacities. Cervical spine fixation hardware is unchanged.  IMPRESSION: Stable cardiomegaly, bibasilar atelectasis, pneumonia and/or pleural fluid.   Electronically Signed   By: Beckie Salts M.D.   On: 01/15/2015 14:20   Dg Chest Port 1 View  01/15/2015   ADDENDUM REPORT: 01/15/2015 13:47  ADDENDUM: Please disregard this examination and report. They were created due to administrative error. Please refer to [id number] for the correct examination  and report   Electronically Signed   By: [facility]   On: 01/15/2015 13:47   01/15/2015   CLINICAL DATA:  Extubation.  EXAM: PORTABLE CHEST - 1 VIEW  COMPARISON:  01/13/2015.  FINDINGS: Interim extubation and removal of NG tube and left IJ tube. Right IJ line noted with tip at cavoatrial junction. Cardiomegaly with continued improvement of pulmonary venous congestion and interstitial edema. Interval resolution of pleural effusions. No pneumothorax. Linear density along the right chest most likely represents a skin fold as lung markings are noted distal to this linear density. Mild gastric distention.  IMPRESSION: 1. Interim extubation and removal of NG tube and left IJ line. Right IJ line tip noted at cavoatrial junction. 2. Cardiomegaly with continued improvement of pulmonary vascular congestion and interstitial edema. Interim resolution of pleural effusions. 3. Gastric distention.  Electronically Signed: By: Maisie Fus  Register On: 01/15/2015 07:02   Dg Chest Port 1 View  01/15/2015   CLINICAL DATA:  Respiratory distress.  EXAM: PORTABLE CHEST - 1 VIEW  COMPARISON:  Chest  radiograph 01/13/2015  FINDINGS: ET tube terminates in the mid trachea. Internal jugular central venous catheter tips are stable in position. Enteric tube courses inferior to the diaphragm. Stable cardiomegaly. Layering moderate bilateral pleural effusions with underlying mid and lower lung hazy pulmonary opacities. No definite pneumothorax.  IMPRESSION: Moderate bilateral pleural effusions with underlying atelectasis and or edema.  Stable support apparatus.   Electronically Signed   By: Annia Belt M.D.   On: 01/15/2015 12:51   Dg Chest Port 1 View  01/13/2015   CLINICAL DATA:  Intubation .  EXAM: PORTABLE CHEST - 1 VIEW  COMPARISON:  01/12/2015.  FINDINGS: Endotracheal tube, bilateral IJ lines, NG tube in stable position. Cardiomegaly with pulmonary vascular prominence and bilateral interstitial prominence with pleural effusions noted. Findings consistent congestive heart failure. Mild interim improvement. No pneumothorax . Prior cervical spine fusion.  IMPRESSION: 1. Lines and tubes in stable position. 2. Congestive heart failure with pulmonary interstitial edema and bilateral pleural effusions. Mild interim improvement from prior exam.   Electronically Signed   By: Maisie Fus  Register   On: 01/13/2015 07:32   Dg Chest Port 1 View  01/12/2015   CLINICAL DATA:  Respiratory failure.  EXAM: PORTABLE CHEST - 1 VIEW  COMPARISON:  01/11/2015.  FINDINGS: Endotracheal tube, NG tube, bilateral IJ lines in stable position. Cardiomegaly with pulmonary vascular prominence and bilateral pulmonary alveolar infiltrates consistent with bilateral pulmonary edema. Bilateral pleural effusions are noted. No pneumothorax. Prior cervical spine fusion.  IMPRESSION: 1. Lines and tubes in stable position. 2. Cardiomegaly with bilateral pulmonary alveolar infiltrates and pleural effusions consistent with congestive heart failure. Bilateral pneumonia cannot be excluded . No and improvement from prior exam.   Electronically Signed   By:  Maisie Fus  Register   On: 01/12/2015 07:13   Dg Chest Port 1 View  01/11/2015   CLINICAL DATA:  Acute respiratory failure with hypoxia. On ventilator. Septic shock.  EXAM: PORTABLE CHEST - 1 VIEW  COMPARISON:  01/10/2015  FINDINGS: Support lines and tubes in appropriate position. Cardiomegaly stable. Bibasilar pulmonary opacity is seen likely representing combination of pleural effusions and infiltrate or atelectasis. These findings are unchanged. No evidence of pneumothorax.  IMPRESSION: No significant change compared with prior exam.   Electronically Signed   By: Myles Rosenthal M.D.   On: 01/11/2015 07:50   Dg Chest Port 1 View  01/10/2015   CLINICAL DATA:  Acute respiratory failure with hypoxemia  EXAM: PORTABLE CHEST - 1 VIEW  COMPARISON:  01/10/2015  FINDINGS: Cardiomegaly is again identified. An endotracheal tube and nasogastric catheter are again identified and stable. A new left jugular central line is noted with the tip near the proximal superior vena cava. A right jugular line is noted within the right innominate vein. No pneumothorax is noted. Bilateral pleural effusions right greater than left are seen. There is likely some underlying atelectasis present.  IMPRESSION: Bilateral pleural effusions.  Tubes and lines as described above without pneumothorax.   Electronically Signed   By: Alcide Clever M.D.   On: 01/10/2015 14:10   Dg Chest Port 1 View  01/10/2015   CLINICAL DATA:  Respiratory failure  EXAM: PORTABLE CHEST - 1 VIEW  COMPARISON:  01/09/2015  FINDINGS: Cardiac shadow remains enlarged. A nasogastric catheter and endotracheal tube are again seen and stable. Bibasilar atelectasis is noted with changes suggestive of small effusions bilaterally.  IMPRESSION: Increasing bibasilar atelectasis and small effusions.   Electronically Signed   By: Alcide Clever M.D.   On: 01/10/2015 07:35   Dg Chest Port 1 View  01/09/2015   CLINICAL DATA:  Respiratory failure.  EXAM: PORTABLE CHEST - 1 VIEW   COMPARISON:  01/08/2015.  FINDINGS: Endotracheal tube and NG tube in good anatomic position. Cardiomegaly with pulmonary vascular prominence and diffuse interstitial prominence with bilateral effusions noted. Findings consistent with congestive heart failure. No pneumothorax. Prior cervical spine fusion.  IMPRESSION: 1. Lines and tubes in stable position. 2. Cardiomegaly with pulmonary venous congestion, bilateral interstitial prominence, and left-sided pleural effusion consistent with congestive heart failure.   Electronically Signed   By: Maisie Fus  Register   On: 01/09/2015 07:16   Dg Chest Portable 1 View  01/08/2015   CLINICAL DATA:  Intubation.  EXAM: PORTABLE CHEST - 1 VIEW  COMPARISON:  Earlier film, same date.  FINDINGS: The endotracheal to is 4 cm above the carina. The NG tube is coursing down the esophagus and into the stomach. The heart is enlarged but stable. Prominent mediastinal and hilar contours are unchanged. External pacer paddles are noted. The lungs are grossly clear and stable. Persistent bibasilar atelectasis.  IMPRESSION: Support apparatus in good position without complicating features.  Stable cardiac enlargement. No significant pulmonary findings. Persistent bibasilar atelectasis.   Electronically Signed   By: Rudie Meyer M.D.   On: 01/08/2015 17:05   Dg Chest Port 1 View  01/08/2015   CLINICAL DATA:  Shortness of breath, cyanosis and abdominal pain. Femoral line placement.  EXAM: PORTABLE CHEST - 1 VIEW  COMPARISON:  01/08/2015 and 05/16/2012.  FINDINGS: 1328 hr. Two views obtained semi erect. Cardiomegaly appears unchanged. There is mild atelectasis at both lung bases. No evidence of pneumoperitoneum, pneumothorax or significant pleural effusion. Postsurgical changes are present status post lower cervical fusion. Telemetry leads overlie the chest.  IMPRESSION: Stable cardiomegaly and bibasilar atelectasis.  No new findings.   Electronically Signed   By: Carey Bullocks M.D.   On:  01/08/2015 13:50   Dg Chest Port 1 View  01/08/2015   CLINICAL DATA:  Confusion and dyspnea since this morning.  EXAM: PORTABLE CHEST - 1 VIEW  COMPARISON:  05/16/2012.  FINDINGS: The heart is mildly enlarged but stable. The mediastinal and hilar contours are slightly prominent but unchanged. Mild vascular congestion but no edema, infiltrates or effusions. The bony thorax is intact.  IMPRESSION: Stable cardiac enlargement and mild vascular congestion. No pulmonary edema or pleural effusions.   Electronically Signed  By: Rudie Meyer M.D.   On: 01/08/2015 11:42   Dg Abd Portable 1v  01/24/2015   CLINICAL DATA:  Projectile vomiting.  EXAM: PORTABLE ABDOMEN - 1 VIEW  COMPARISON:  01/13/2015.  FINDINGS: Normal bowel gas pattern. Lumbosacral laminectomy defects and fixation hardware.  IMPRESSION: No acute abnormality.   Electronically Signed   By: Beckie Salts M.D.   On: 01/24/2015 19:47   Dg Abd Portable 1v  01/13/2015   CLINICAL DATA:  Abdominal distension of uncertain etiology, septic shock, acute respiratory failure, acute renal injury.  EXAM: PORTABLE ABDOMEN - 1 VIEW  COMPARISON:  Portable abdominal film of January 09, 2015  FINDINGS: There is a relative paucity of bowel gas. Distended bowel loops are not observed. A nasogastric tube is in place with the tip in the region of the distal pylorus or proximal duodenum. There are no abnormal soft tissue calcifications. The patient has undergone a previous lumbosacral fusion posteriorly. The right-sided femoral catheter has been removed. The left-sided femoral catheter remains.  IMPRESSION: No acute intra-abdominal abnormality is demonstrated. The esophagogastric tube appears to be in appropriate position.   Electronically Signed   By: David  [place] M.D.   On: 01/13/2015 11:13   Dg Abd Portable 1v  01/09/2015   CLINICAL DATA:  Distended abdomen.  EXAM: PORTABLE ABDOMEN - 1 VIEW  COMPARISON:  01/08/2015  FINDINGS: Partial imaging of the abdomen (the upper and  right abdomen is excluded from view) shows a nonobstructive bowel gas pattern. Nasogastric tube tip is at the distal stomach.  Right femoral line with tip overlying the right sacral ala.  Hazy appearance of the abdomen is likely from patient size and motion, ascites not excluded. No gross intra-abdominal mass effect.  Lumbosacral fusion.  IMPRESSION: 1. Orogastric tube tip at the distal stomach. 2. Stable appearance of right femoral line. 3. Nonobstructive bowel gas pattern.   Electronically Signed   By: Marnee Spring M.D.   On: 01/09/2015 22:00   Dg Abd Portable 1v  01/08/2015   CLINICAL DATA:  Abdominal pain  EXAM: PORTABLE ABDOMEN - 1 VIEW  COMPARISON:  CT abdomen and pelvis April 25, 2012  FINDINGS: There is moderate stool in the colon. No obstruction is appreciable on this supine examination. No free air is seen on this supine examination. There is postoperative change at L5 and S1. There is a right femoral catheter with the tip overlying the mid sacrum. Catheter tip is likely in the right external iliac vein region.  IMPRESSION: Right frontal region catheter with tip overlying the mid right sacrum. No obstruction or free air is seen on this supine examination. Note that evaluation for free air is limited on supine only examination.   Electronically Signed   By: Bretta Bang III M.D.   On: 01/08/2015 13:42    Labs:  CBC:  Recent Labs  01/25/15 0550 01/26/15 0333 01/27/15 0525 01/28/15 0445  WBC 6.9 8.0 8.0 7.8  HGB 8.2* 7.9* 8.3* 7.7*  HCT 23.5* 23.2* 24.7* 23.4*  PLT 170 246 258 284    COAGS:  Recent Labs  01/08/15 1350 01/09/15 1630 01/11/15 0413 01/12/15 0801 01/13/15 0001 01/13/15 1730 01/14/15 0328  INR 1.54* 2.24* 1.68* 1.34  --   --   --   APTT  --   --   --   --  60* 51* 59*    BMP:  Recent Labs  01/25/15 0550 01/26/15 0333 01/27/15 0525 01/28/15 0445  NA 134* 134* 137 133*  K 3.7 3.7 4.1 3.8  CL 98* 97* 100* 98*  CO2 21* 23 27 26   GLUCOSE 80 99  105* 103*  BUN 77* 87* 39* 51*  CALCIUM 8.0* 8.1* 7.9* 7.8*  CREATININE 12.61* 14.55* 9.33* 11.42*  GFRNONAA 4* 3* 5* 4*  GFRAA 4* 4* 6* 5*    LIVER FUNCTION TESTS:  Recent Labs  01/22/15 0411  01/24/15 0445 01/25/15 0550 01/26/15 0333 01/27/15 0525 01/28/15 0445  BILITOT 2.6*  --  2.1* 2.0*  --  1.6*  --   AST 33  --  54* 45*  --  50*  --   ALT 99*  --  84* 70*  --  65*  --   ALKPHOS 104  --  148* 138*  --  145*  --   PROT 5.0*  --  5.1* 5.1*  --  5.3*  --   ALBUMIN 1.9*  < > 2.0*  2.0* 2.0*  2.0* 2.0* 2.0*  2.1* 1.9*  < > = values in this interval not displayed.  TUMOR MARKERS: No results for input(s): AFPTM, CEA, CA199, CHROMGRNA in the last 8760 hours.  Assessment and Plan:  Acute renal failure ATN/sepsis Worsening function Temp cath in Rt femoral now Need for tunneled catheter for ongoing treatment Scheduled now for same 9/8 Risks and Benefits discussed with the patient including, but not limited to bleeding, infection, vascular injury, pneumothorax which may require chest tube placement, air embolism or even death All of the patient's questions were answered, patient is agreeable to proceed. Consent signed and in chart.  Will HOLD Hep 3 hrs pre procedure   Thank you for this interesting consult.  I greatly enjoyed meeting ROWIN BAYRON and look forward to participating in their care.  A copy of this report was sent to the requesting provider on this date.  Signed: Anjani Feuerborn A 01/28/2015, 12:39 PM   I spent a total of 40 Minutes    in face to face in clinical consultation, greater than 50% of which was counseling/coordinating care for tunneled HD cath

## 2015-01-29 ENCOUNTER — Inpatient Hospital Stay (HOSPITAL_COMMUNITY): Payer: Medicare Other

## 2015-01-29 LAB — CBC
HEMATOCRIT: 23.5 % — AB (ref 39.0–52.0)
HEMOGLOBIN: 7.8 g/dL — AB (ref 13.0–17.0)
MCH: 30.2 pg (ref 26.0–34.0)
MCHC: 33.2 g/dL (ref 30.0–36.0)
MCV: 91.1 fL (ref 78.0–100.0)
Platelets: 284 10*3/uL (ref 150–400)
RBC: 2.58 MIL/uL — ABNORMAL LOW (ref 4.22–5.81)
RDW: 15.6 % — ABNORMAL HIGH (ref 11.5–15.5)
WBC: 9 10*3/uL (ref 4.0–10.5)

## 2015-01-29 LAB — RENAL FUNCTION PANEL
ANION GAP: 12 (ref 5–15)
Albumin: 1.9 g/dL — ABNORMAL LOW (ref 3.5–5.0)
BUN: 58 mg/dL — ABNORMAL HIGH (ref 6–20)
CO2: 24 mmol/L (ref 22–32)
Calcium: 7.9 mg/dL — ABNORMAL LOW (ref 8.9–10.3)
Chloride: 98 mmol/L — ABNORMAL LOW (ref 101–111)
Creatinine, Ser: 13.24 mg/dL — ABNORMAL HIGH (ref 0.61–1.24)
GFR calc Af Amer: 4 mL/min — ABNORMAL LOW (ref >60)
GFR calc non Af Amer: 3 mL/min — ABNORMAL LOW (ref >60)
GLUCOSE: 92 mg/dL (ref 65–99)
POTASSIUM: 4.3 mmol/L (ref 3.5–5.1)
Phosphorus: 5 mg/dL — ABNORMAL HIGH (ref 2.5–4.6)
Sodium: 134 mmol/L — ABNORMAL LOW (ref 135–145)

## 2015-01-29 LAB — PROTIME-INR
INR: 1.31 (ref 0.00–1.49)
Prothrombin Time: 16.4 seconds — ABNORMAL HIGH (ref 11.6–15.2)

## 2015-01-29 LAB — HEPARIN LEVEL (UNFRACTIONATED): Heparin Unfractionated: 0.24 IU/mL — ABNORMAL LOW (ref 0.30–0.70)

## 2015-01-29 MED ORDER — HEPARIN SODIUM (PORCINE) 1000 UNIT/ML IJ SOLN
INTRAMUSCULAR | Status: AC
  Filled 2015-01-29: qty 1

## 2015-01-29 MED ORDER — MIDAZOLAM HCL 2 MG/2ML IJ SOLN
INTRAMUSCULAR | Status: AC
  Filled 2015-01-29: qty 2

## 2015-01-29 MED ORDER — DEXTROSE 5 % IV SOLN
3.0000 g | INTRAVENOUS | Status: AC
  Administered 2015-01-29: 3 g via INTRAVENOUS
  Filled 2015-01-29: qty 3000

## 2015-01-29 MED ORDER — LIDOCAINE-EPINEPHRINE (PF) 1 %-1:200000 IJ SOLN
INTRAMUSCULAR | Status: AC
  Filled 2015-01-29: qty 30

## 2015-01-29 MED ORDER — HEPARIN SODIUM (PORCINE) 5000 UNIT/ML IJ SOLN
5000.0000 [IU] | Freq: Three times a day (TID) | INTRAMUSCULAR | Status: DC
  Administered 2015-01-29 – 2015-02-11 (×28): 5000 [IU] via SUBCUTANEOUS
  Filled 2015-01-29 (×37): qty 1

## 2015-01-29 MED ORDER — FENTANYL CITRATE (PF) 100 MCG/2ML IJ SOLN
INTRAMUSCULAR | Status: AC
  Filled 2015-01-29: qty 4

## 2015-01-29 MED ORDER — MIDAZOLAM HCL 2 MG/2ML IJ SOLN
INTRAMUSCULAR | Status: AC
  Filled 2015-01-29: qty 4

## 2015-01-29 NOTE — Progress Notes (Signed)
Patient refused CPAP.  Patient aware to ask RN to call RT if he changes his mind.

## 2015-01-29 NOTE — Progress Notes (Signed)
Patient was approached couple of times to change the dressing but patient refused and wanted to be done later, currently patient doesn't want to be disturbed and wants to take some rest.  Will try to do it later.

## 2015-01-29 NOTE — Procedures (Signed)
Interventional Radiology Procedure Note  Procedure: Placement RIGHT IJ 28 cm Palindrome tunneled HD catheter.  Tip in the mid RA and ready for use.   Complications: None  Estimated Blood Loss: 0  Recommendations: - May use for dialysis - Routine line care  Signed,  Sterling Big, MD

## 2015-01-29 NOTE — Progress Notes (Signed)
PT Cancellation Note  Patient Details Name: Jared [name] MRN: [id number] DOB: 05/25/1955   Cancelled Treatment:    Reason Eval/Treat Not Completed: Patient at procedure or test/unavailable.  Patient had tunneled HD catheter placed today in IR.  Will return tomorrow for PT session.   Vena Austria 01/29/2015, 8:32 PM Durenda Hurt. Renaldo Fiddler, [facility] Acute Rehab Services Pager [phone]

## 2015-01-29 NOTE — Progress Notes (Signed)
PCCM and [place] TEAM 1 TRANSFER Progress Note  Jared [name] MBW:[id number] DOB: [birth year] DOA: 01/08/2015 PCP: Nadean Corwin, MD  Admit HPI / Brief Narrative: 59 year old M Hx Depression, Anxiety, HTN, HLD, OSA, and Chronic Back Pain who presented 8/18 after being found down at home. Responsive to narcan via EMS. In ED was profoundly hypotensive with elevated WBC, refractory to volume. In ER noted to have lactate 8.5, hyperkalemia and started on pressors. PCCM admitted the pt.    Significant Events: 8/18 Admit - CCS, Cardiology consulted; brief PEA arrest; exploratory laparotomy with placement of wound vac; started heparin gtt for presumed PE 8/20 CRRT  8/21 OR for abd wound closure  8/22 Anuric. On CRRT. Off pressors. Improved Shock Liver. On fent gtt and heparing gtt. 40% fio2 8/23 ileus 8/23 Cardiac catheterization left circumflex, second obtuse marginal branch,Ost 2d Mrg-2d Mrg lesion or percent stenosed 8/25 HIT Ab screen normal - TNA initiated  8/26 Bleeding around abd wound - surface bled and IV heparin held  9/5 patient was transferred to telemetry floor , received HD on 9/5  HPI/Subjective: Feels well, breathing ok  Assessment/Plan:  Sepsis with presumed abdominal source > exp lap unrevealing -improved, source unclear  -Vanc stopped 8/23 - Zosyn stopped 8/26 - temp up to 101 8/30 - Vanc resumed 8/30 - HD catheter removed 8/30 - new temp cath placed 8/31 right femoral - WBC has normalized - the patient is afebrile  - follow up blood cultures negative 4 thus far - placement of tunneled catheter today  RV failure -RA and RV dliated w/ R heart failure on TTE  -no CTA due to renal failure , venous duplex w/o evidence of DVT  -was on IV heparin, VQ scan with low probability of PE and hence stopped IV heparin  Projectile vomiting - KUB without evidence of obstruction/ileus - clinical exam reveals bowel sounds present - currently tolerating diet without  difficulty  CAD Cardiac cath noted an occluded OM - Cardiology has evaluated and plans no further cardiac workup  Acute abdominal pain s/p laparotomy -wound VAC now discontinued - continue with wound care per general surgery recommendations  Altered mental status  -Appears to be multifactorial  -resolved  Acute renal failure/ATN with metab acidosis and hyperkalemia in CKD  -ATN from sepsis - renal US neg  - CRRT started per renal 8/20 - stopped 01/15/15  - On HD since 01/16/15, last HD 9/5 - RF and pan autoimmune negative 01/14/15 - baseline creatinine 1.14 from 12/17/14 - per Nephrology, plan for [facility] today and resumed HD  OSA -CPAP per respiratory, though patient is frequently refusing  Anemia/thrombocytopenia - Most likely multifactorial to include acute illness, sepsis - HIT panel negative - platelet count is climbing  Shock liver -LFTs have nearly normalized  A- Fib -Remains in NSR - off amio since 01/11/15  Systolic CHF/RV dilation -volume management per HD   Pulmonary hypertension  Hyperglycemia  -CBG currently normal   Code Status: FULL Family Communication: None at bedside Disposition Plan: - continue PT/OT - await determination of chronicity of hemodialysis   Consultants: PCCM Gen Surgery  [facility] Cardiology  Nephrology  Procedure/Significant Events: 8/18 Echo - EF 45 to 50%, mod RV dilation, mod/severe RV dysfx, PAS 47 mmHg 8/18 Renal u/s - b/l renal echogenicity  DVT prophylaxis: Heparin drip  Objective: Blood pressure 158/72, pulse 76, temperature 98 F (36.7 C), temperature source Oral, resp. rate 16, weight 130.4 kg (287 lb 7.7 oz), SpO2 98 %.  Intake/Output Summary (Last 24 hours) at 01/29/15 1306 Last data filed at 01/29/15 0900  Gross per 24 hour  Intake   3529 ml  Output      0 ml  Net   3529 ml   Exam: General: No acute respiratory distress - alert  HEENt: TLC in neck Lungs: Clear to auscultation bilaterally without wheeze    Cardiovascular: Regular rate and rhythm without murmur  Abdomen: Large midline wound dressed and dry, faint bowel sounds, soft, no rebound  Extremities: No significant cyanosis or clubbing - 1+ B LE edema, HD cath in R groin  Data Reviewed: Basic Metabolic Panel:  Recent Labs Lab 01/23/15 0715  01/24/15 0445 01/25/15 0550 01/26/15 0333 01/27/15 0525 01/28/15 0445 01/29/15 0505  NA  --   < > 134* 134* 134* 137 133* 134*  K  --   < > 3.6 3.7 3.7 4.1 3.8 4.3  CL  --   < > 97* 98* 97* 100* 98* 98*  CO2  --   < > 25 21* GLUCOSE  --   < > 86 80 99 105* 103* 92  BUN  --   < > 61* 77* 87* 39* 51* 58*  CREATININE  --   < > 10.09* 12.61* 14.55* 9.33* 11.42* 13.24*  CALCIUM  --   < > 8.0* 8.0* 8.1* 7.9* 7.8* 7.9*  MG 2.0  --  2.0  --   --   --   --   --   PHOS  --   < > 4.7* 6.8* 7.3* 4.5 5.0* 5.0*  < > = values in this interval not displayed.   Liver Function Tests:  Recent Labs Lab 01/24/15 0445 01/25/15 0550 01/26/15 0333 01/27/15 0525 01/28/15 0445 01/29/15 0505  AST 54* 45*  --  50*  --   --   ALT 84* 70*  --  65*  --   --   ALKPHOS 148* 138*  --  145*  --   --   BILITOT 2.1* 2.0*  --  1.6*  --   --   PROT 5.1* 5.1*  --  5.3*  --   --   ALBUMIN 2.0*  2.0* 2.0*  2.0* 2.0* 2.0*  2.1* 1.9* 1.9*    CBC:  Recent Labs Lab 01/25/15 0550 01/26/15 0333 01/27/15 0525 01/28/15 0445 01/29/15 0505  WBC 6.9 8.0 8.0 7.8 9.0  HGB 8.2* 7.9* 8.3* 7.7* 7.8*  HCT 23.5* 23.2* 24.7* 23.4* 23.5*  MCV 87.4 88.2 89.5 90.7 91.1  PLT 170 246 258 284 284    CBG:  Recent Labs Lab 01/22/15 1646 01/23/15 0856 01/23/15 1622 01/24/15 1544  GLUCAP 102* 78 76 97    Recent Results (from the past 240 hour(s))  Culture, blood (routine x 2)     Status: None   Collection Time: 01/20/15  3:48 PM  Result Value Ref Range Status   Specimen Description BLOOD PORTA CATH  Final   Special Requests BOTTLES DRAWN AEROBIC AND ANAEROBIC  Final   Culture NO GROWTH 5 DAYS   Final   Report Status 01/25/2015 FINAL  Final  Culture, blood (routine x 2)     Status: None   Collection Time: 01/20/15  4:25 PM  Result Value Ref Range Status   Specimen Description BLOOD PORTA CATH  Final   Special Requests BOTTLES DRAWN AEROBIC AND ANAEROBIC  Final   Culture NO GROWTH 5 DAYS  Final   Report  Status 01/25/2015 FINAL  Final  Culture, blood (routine x 2)     Status: None   Collection Time: 01/22/15  3:25 PM  Result Value Ref Range Status   Specimen Description BLOOD LEFT ARM  Final   Special Requests BOTTLES DRAWN AEROBIC ONLY 5CC  Final   Culture NO GROWTH 5 DAYS  Final   Report Status 01/27/2015 FINAL  Final  Culture, blood (routine x 2)     Status: None   Collection Time: 01/22/15  3:30 PM  Result Value Ref Range Status   Specimen Description BLOOD RIGHT ARM  Final   Special Requests BOTTLES DRAWN AEROBIC ONLY 2CC  Final   Culture NO GROWTH 5 DAYS  Final   Report Status 01/27/2015 FINAL  Final  Culture, Urine     Status: None   Collection Time: 01/22/15  6:03 PM  Result Value Ref Range Status   Specimen Description URINE, CATHETERIZED  Final   Special Requests NONE  Final   Culture NO GROWTH 1 DAY  Final   Report Status 01/23/2015 FINAL  Final       Scheduled Meds:  Scheduled Meds: . antiseptic oral rinse  7 mL Mouth Rinse QID  . aspirin  81 mg Oral Daily  . chlorhexidine gluconate  15 mL Mouth Rinse BID  . darbepoetin (ARANESP) injection - DIALYSIS  100 mcg Intravenous Q Mon-HD  . feeding supplement (NEPRO CARB STEADY)  237 mL Oral BID BM  . fentaNYL      . heparin      . heparin subcutaneous  5,000 Units Subcutaneous 3 times per day  . lidocaine-EPINEPHrine      . midazolam      . midazolam      . multivitamin with minerals  1 tablet Oral Daily  . pantoprazole  40 mg Oral QHS  . sodium chloride  10 mL Intravenous Q12H    Time spent on care of this patient: 30 mins  Zannie Cove [phone] [facility] For Consults/Admissions -  Flow Manager - [phone] Office  [phone]  Contact MD directly via text page:      [email or web address]      password [email or web address]  01/29/2015, 1:06 PM   LOS: 21 days

## 2015-01-29 NOTE — Progress Notes (Signed)
Mr. Jared [name] was referred for CKD education today by the acute dialysis nursing staff. Introduction was made and reason for my visit. Mr. Jared [name] prefers that I contact his wife and arrange for education with her present. TOPs material (treatment options program) left at bedside with my business card. A message has been left for Mrs. Jared [name].

## 2015-01-29 NOTE — Progress Notes (Signed)
Pt continuing to refuse cpap for the night

## 2015-01-29 NOTE — Progress Notes (Signed)
Patient ID: Jared [name], male   DOB: 1955-11-12, 59 y.o.   MRN: [id number]  S:  Had 250 UOP recorded in one shift but BUN and creatinine up convincingly again without HD He is sedated from the PC this AM   O:BP 162/76 mmHg  Pulse 87  Temp(Src) 98.7 F (37.1 C) (Oral)  Resp 18  Wt 130.4 kg (287 lb 7.7 oz)  SpO2 96%  Intake/Output Summary (Last 24 hours) at 01/29/15 1148 Last data filed at 01/29/15 0900  Gross per 24 hour  Intake   3529 ml  Output    100 ml  Net   3429 ml   Intake/Output: I/O last 3 completed shifts: In: 4869 [P.O.:600; I.V.:4269] Out: 850 [Urine:850]  Intake/Output this shift:    Weight change: 1.64 kg (3 lb 9.9 oz) Gen: sedated from PC this AM Heart: RRR, no m/r/g Resp:bibasilar crackles.  Abd:+BS, binder in place, tender to palpation EXT: trace - 1+ edema Neuro: awake today,  Clearer by the day   Recent Labs Lab 01/23/15 0716 01/24/15 0445 01/25/15 0550 01/26/15 0333 01/27/15 0525 01/28/15 0445 01/29/15 0505  NA 129* 134* 134* 134* 137 133* 134*  K 3.6 3.6 3.7 3.7 4.1 3.8 4.3  CL 97* 97* 98* 97* 100* 98* 98*  CO2 18* 25 21* 23 27 26 24   GLUCOSE 78 86 80 99 105* 103* 92  BUN 114* 61* 77* 87* 39* 51* 58*  CREATININE 14.49* 10.09* 12.61* 14.55* 9.33* 11.42* 13.24*  ALBUMIN 1.8* 2.0*  2.0* 2.0*  2.0* 2.0* 2.0*  2.1* 1.9* 1.9*  CALCIUM 8.2* 8.0* 8.0* 8.1* 7.9* 7.8* 7.9*  PHOS 7.0* 4.7* 6.8* 7.3* 4.5 5.0* 5.0*  AST  --  54* 45*  --  50*  --   --   ALT  --  84* 70*  --  65*  --   --    Liver Function Tests:  Recent Labs Lab 01/24/15 0445 01/25/15 0550  01/27/15 0525 01/28/15 0445 01/29/15 0505  AST 54* 45*  --  50*  --   --   ALT 84* 70*  --  65*  --   --   ALKPHOS 148* 138*  --  145*  --   --   BILITOT 2.1* 2.0*  --  1.6*  --   --   PROT 5.1* 5.1*  --  5.3*  --   --   ALBUMIN 2.0*  2.0* 2.0*  2.0*  < > 2.0*  2.1* 1.9* 1.9*  < > = values in this interval not displayed. No results for input(s): LIPASE, AMYLASE in the last 168  hours.  Recent Labs Lab 01/22/15 1743 01/26/15 0333  AMMONIA 33 38*   CBC:  Recent Labs Lab 01/25/15 0550 01/26/15 0333 01/27/15 0525 01/28/15 0445 01/29/15 0505  WBC 6.9 8.0 8.0 7.8 9.0  HGB 8.2* 7.9* 8.3* 7.7* 7.8*  HCT 23.5* 23.2* 24.7* 23.4* 23.5*  MCV 87.4 88.2 89.5 90.7 91.1  PLT 170 246 258 284 284   Cardiac Enzymes: No results for input(s): CKTOTAL, CKMB, CKMBINDEX, TROPONINI in the last 168 hours. CBG:  Recent Labs Lab 01/22/15 1646 01/23/15 0856 01/23/15 1622 01/24/15 1544  GLUCAP 102* 78 76 97    Iron Studies:  No results for input(s): IRON, TIBC, TRANSFERRIN, FERRITIN in the last 72 hours. Studies/Results: Nm Pulmonary Perf And Vent  01/28/2015   CLINICAL DATA:  Hypoxemia and elevated pulmonary arterial pressure  EXAM: NUCLEAR MEDICINE VENTILATION - PERFUSION LUNG SCAN  TECHNIQUE:  Ventilation images were obtained in multiple projections using inhaled aerosol Tc-86m DTPA. Perfusion images were obtained in multiple projections after intravenous injection of Tc-34m MAA.  RADIOPHARMACEUTICALS:  41 mCi Technetium-61m DTPA aerosol inhalation and 6 mCi Technetium-46m MAA IV  COMPARISON:  01/22/2015  FINDINGS: Ventilation: No focal ventilation defect. Cardiomegaly contributes to a large central defect as due to the enlarged pulmonary arteries.  Perfusion: No wedge shaped peripheral perfusion defects to suggest acute pulmonary embolism. Large central defects are noted related to cardiomegaly and enlarged pulmonary arteries.  IMPRESSION: No ventilation-perfusion mismatch to suggest pulmonary embolism.   Electronically Signed   By: Alcide Clever M.D.   On: 01/28/2015 15:51   . antiseptic oral rinse  7 mL Mouth Rinse QID  . aspirin  81 mg Oral Daily  . chlorhexidine gluconate  15 mL Mouth Rinse BID  . darbepoetin (ARANESP) injection - DIALYSIS  100 mcg Intravenous Q Mon-HD  . feeding supplement (NEPRO CARB STEADY)  237 mL Oral BID BM  . fentaNYL      . heparin      .  heparin subcutaneous  5,000 Units Subcutaneous 3 times per day  . lidocaine-EPINEPHrine      . midazolam      . midazolam      . multivitamin with minerals  1 tablet Oral Daily  . pantoprazole  40 mg Oral QHS  . sodium chloride  10 mL Intravenous Q12H    BMET    Component Value Date/Time   NA 134* 01/29/2015 0505   K 4.3 01/29/2015 0505   CL 98* 01/29/2015 0505   CO2 24 01/29/2015 0505   GLUCOSE 92 01/29/2015 0505   BUN 58* 01/29/2015 0505   CREATININE 13.24* 01/29/2015 0505   CREATININE 1.15 12/17/2014 1707   CALCIUM 7.9* 01/29/2015 0505   GFRNONAA 3* 01/29/2015 0505   GFRNONAA 68 12/17/2014 1707   GFRAA 4* 01/29/2015 0505   GFRAA 79 12/17/2014 1707   CBC    Component Value Date/Time   WBC 9.0 01/29/2015 0505   RBC 2.58* 01/29/2015 0505   RBC 2.63* 01/26/2015 0333   HGB 7.8* 01/29/2015 0505   HCT 23.5* 01/29/2015 0505   PLT 284 01/29/2015 0505   MCV 91.1 01/29/2015 0505   MCH 30.2 01/29/2015 0505   MCHC 33.2 01/29/2015 0505   RDW 15.6* 01/29/2015 0505   LYMPHSABS 1.0 01/21/2015 0312   MONOABS 1.1* 01/21/2015 0312   EOSABS 1.0* 01/21/2015 0312   BASOSABS 0.1 01/21/2015 0312     Assessment/Plan:  1. AoCKD, presumably due to septic shock related ATN- creatinine 1.15 on July 27 1. CVVHD from 8/20-  01/14/15 and transitioned to intermittent HD 01/16/15- done basically TIW- last on 9/5 2.  using temp HD cath -- Femoral placed 8/31- will be able to remove after HD today  3. It seems he will clearly need more HD - HD dependent since 8/20 almost 3 weeks- s/p [facility] also needs HD today-  Will also need to CLIP to OP unit- ? Hold off on perm access given normal renal function in late July   2. SIRS- clinical PE (RA and RV dilated, RV hypocontractile on ECHO). CT angio on hold for now (concerns for renal recovery as well as IV access).plan is long term anticoagulation - once gets [facility] can be started on coumadin- per primary  3. CAD- cath revealed occluded OM, cardiology  following 4. Shock liver- improved.  5. S/p exploratory lap and fascia closure now s/p wound vac -  with some bleeding, improved after holding anticoag, now back on anticoag.  6. Metabolic acidosis- improved 7. Hyponatremia. - improved 8. Thrombocytopenia- hit panel neg. Likely 2/2 to shocked liver. Now improved. Back on heparin - no bleeding currently.  9. Hypoalbuminemia- per primary svc 10. Nutrition: off TPN, taking some PO 11. Persistent Fevers: 8/30: temp HD cath removed.  Started Vanc.  B Cx x2 NGTD- resolved  12. Anemia- have added ESA - ferr over 1000 no iron - will give blood with HD today   Joelly Bolanos A

## 2015-01-30 DIAGNOSIS — I482 Chronic atrial fibrillation: Secondary | ICD-10-CM

## 2015-01-30 LAB — URINALYSIS, ROUTINE W REFLEX MICROSCOPIC
Bilirubin Urine: NEGATIVE
Glucose, UA: NEGATIVE mg/dL
Ketones, ur: NEGATIVE mg/dL
Leukocytes, UA: NEGATIVE
Nitrite: NEGATIVE
Protein, ur: 100 mg/dL — AB
Specific Gravity, Urine: 1.007 (ref 1.005–1.030)
Urobilinogen, UA: 0.2 mg/dL (ref 0.0–1.0)
pH: 7 (ref 5.0–8.0)

## 2015-01-30 LAB — RENAL FUNCTION PANEL
ALBUMIN: 2 g/dL — AB (ref 3.5–5.0)
ANION GAP: 12 (ref 5–15)
BUN: 67 mg/dL — ABNORMAL HIGH (ref 6–20)
CO2: 24 mmol/L (ref 22–32)
Calcium: 8.1 mg/dL — ABNORMAL LOW (ref 8.9–10.3)
Chloride: 100 mmol/L — ABNORMAL LOW (ref 101–111)
Creatinine, Ser: 14.94 mg/dL — ABNORMAL HIGH (ref 0.61–1.24)
GFR, EST AFRICAN AMERICAN: 3 mL/min — AB (ref >60)
GFR, EST NON AFRICAN AMERICAN: 3 mL/min — AB (ref >60)
Glucose, Bld: 93 mg/dL (ref 65–99)
PHOSPHORUS: 6.4 mg/dL — AB (ref 2.5–4.6)
POTASSIUM: 4.3 mmol/L (ref 3.5–5.1)
Sodium: 136 mmol/L (ref 135–145)

## 2015-01-30 LAB — PREPARE RBC (CROSSMATCH)

## 2015-01-30 LAB — URINE MICROSCOPIC-ADD ON

## 2015-01-30 MED ORDER — ACETAMINOPHEN 325 MG PO TABS
ORAL_TABLET | ORAL | Status: AC
  Filled 2015-01-30: qty 2

## 2015-01-30 MED ORDER — HYDROMORPHONE HCL 1 MG/ML IJ SOLN
INTRAMUSCULAR | Status: AC
  Filled 2015-01-30: qty 2

## 2015-01-30 MED ORDER — ALPRAZOLAM 0.5 MG PO TABS
0.5000 mg | ORAL_TABLET | Freq: Three times a day (TID) | ORAL | Status: DC | PRN
  Administered 2015-01-30 – 2015-02-02 (×7): 0.5 mg via ORAL
  Filled 2015-01-30 (×7): qty 1

## 2015-01-30 MED ORDER — HEPARIN SODIUM (PORCINE) 1000 UNIT/ML DIALYSIS: 20.0000 [IU]/kg | INTRAMUSCULAR | Status: DC | PRN

## 2015-01-30 MED ORDER — SODIUM CHLORIDE 0.9 % IV SOLN: Freq: Once | INTRAVENOUS | Status: DC

## 2015-01-30 NOTE — Progress Notes (Signed)
PT Cancellation Note  Patient Details Name: Jared [name] MRN: [id number] DOB: 1955/08/07   Cancelled Treatment:    Reason Eval/Treat Not Completed: Patient at procedure or test/unavailable;Fatigue/lethargy limiting ability to participate.  Attempted to see patient x3 today - at procedures x2 and declined PT on 3rd attempt.  Will return at later time for PT session.   Vena Austria 01/30/2015, 4:37 PM Durenda Hurt. Renaldo Fiddler, [facility] Acute Rehab Services Pager [phone]

## 2015-01-30 NOTE — Progress Notes (Signed)
PCCM and [place] TEAM 1 TRANSFER Progress Note  Jared [name] ZOX:[id number] DOB: 1955-10-12 DOA: 01/08/2015 PCP: Nadean Corwin, MD  Admit HPI / Brief Narrative: 59 year old M Hx Depression, Anxiety, HTN, HLD, OSA, and Chronic Back Pain who presented 8/18 after being found down at home. Responsive to narcan via EMS. In ED was profoundly hypotensive with elevated WBC, refractory to volume. In ER noted to have lactate 8.5, hyperkalemia and started on pressors. PCCM admitted the pt.    Significant Events: 8/18 Admit - CCS, Cardiology consulted; brief PEA arrest; exploratory laparotomy with placement of wound vac; started heparin gtt for presumed PE 8/20 CRRT  8/21 OR for abd wound closure  8/22 Anuric. On CRRT. Off pressors. Improved Shock Liver. On fent gtt and heparing gtt. 40% fio2 8/23 ileus 8/23 Cardiac catheterization left circumflex, second obtuse marginal branch,Ost 2d Mrg-2d Mrg lesion or percent stenosed 8/25 HIT Ab screen normal - TNA initiated  8/26 Bleeding around abd wound - surface bled and IV heparin held  9/5 patient was transferred to telemetry floor , received HD on 9/5  HPI/Subjective: Feels well, breathing ok  Assessment/Plan:  Sepsis with presumed abdominal source > exp lap unrevealing -improved, source unclear  -Vanc stopped 8/23 - Zosyn stopped 8/26 - temp up to 101 8/30 - Vanc resumed 8/30 - HD catheter removed 8/30 - new temp cath placed 8/31 right femoral - WBC has normalized - the patient is afebrile  - follow up blood cultures negative 4 thus far  Acute renal failure/ATN with metab acidosis and hyperkalemia in CKD  - ATN from sepsis, Renal following - CRRT started per renal 8/20 - stopped 01/15/15  - On HD since 01/16/15, last HD 9/5 - RF and pan autoimmune negative 01/14/15 - baseline creatinine 1.14 from 12/17/14 - per Nephrology, s/p Tunneled HD cath 9/8   RV failure -RA and RV dliated w/ R heart failure on TTE  -no CTA due to renal  failure , venous duplex w/o evidence of DVT  -was on IV heparin, VQ scan with low probability of PE and hence stopped IV heparin  Anemia  -due to acute illness/critical illness -1 unit PRBC transfused 9/9  CAD Cardiac cath noted an occluded OM - Cardiology has evaluated and plans no further cardiac workup  Acute abdominal pain s/p laparotomy -wound VAC now discontinued - continue with wound care per general surgery recommendations  Metabolic encephalopathy -Appears to be multifactorial  -resolved  OSA -CPAP per respiratory, though patient is frequently refusing  Anemia/thrombocytopenia - Most likely multifactorial to include acute illness, sepsis - HIT panel negative - platelet count is climbing  Shock liver -LFTs have nearly normalized  A- Fib -Remains in NSR - off amio since 01/11/15  Systolic CHF/RV dilation -volume management per HD   Pulmonary hypertension  Hyperglycemia  -CBG currently normal   DVT prophylaxis: Heparin SQ  Code Status: FULL Family Communication: None at bedside Disposition Plan: - home early next week  Consultants: PCCM Gen Surgery  [facility] Cardiology  Nephrology  Procedure/Significant Events: 8/18 Echo - EF 45 to 50%, mod RV dilation, mod/severe RV dysfx, PAS 47 mmHg 8/18 Renal u/s - b/l renal echogenicity  Objective: Blood pressure 158/78, pulse 66, temperature 98.9 F (37.2 C), temperature source Oral, resp. rate 20, weight 125 kg (275 lb 9.2 oz), SpO2 98 %.  Intake/Output Summary (Last 24 hours) at 01/30/15 1508 Last data filed at 01/30/15 1153  Gross per 24 hour  Intake    657  ml  Output    900 ml  Net   -243 ml   Exam: General: No acute respiratory distress - alert  HEENt: TLC in neck Lungs: Clear to auscultation bilaterally without wheeze  Cardiovascular: Regular rate and rhythm without murmur  Abdomen: Large midline wound dressed and dry, faint bowel sounds, soft, no rebound  Extremities: No significant cyanosis or  clubbing - 1+ B LE edema, HD cath in R groin  Data Reviewed: Basic Metabolic Panel:  Recent Labs Lab 01/24/15 0445  01/26/15 0333 01/27/15 0525 01/28/15 0445 01/29/15 0505 01/30/15 0457  NA 134*  < > 134* 137 133* 134* 136  K 3.6  < > 3.7 4.1 3.8 4.3 4.3  CL 97*  < > 97* 100* 98* 98* 100*  CO2 25  < > 23 27 26 24 24   GLUCOSE 86  < > 99 105* 103* 92 93  BUN 61*  < > 87* 39* 51* 58* 67*  CREATININE 10.09*  < > 14.55* 9.33* 11.42* 13.24* 14.94*  CALCIUM 8.0*  < > 8.1* 7.9* 7.8* 7.9* 8.1*  MG 2.0  --   --   --   --   --   --   PHOS 4.7*  < > 7.3* 4.5 5.0* 5.0* 6.4*  < > = values in this interval not displayed.   Liver Function Tests:  Recent Labs Lab 01/24/15 0445 01/25/15 0550 01/26/15 0333 01/27/15 0525 01/28/15 0445 01/29/15 0505 01/30/15 0457  AST 54* 45*  --  50*  --   --   --   ALT 84* 70*  --  65*  --   --   --   ALKPHOS 148* 138*  --  145*  --   --   --   BILITOT 2.1* 2.0*  --  1.6*  --   --   --   PROT 5.1* 5.1*  --  5.3*  --   --   --   ALBUMIN 2.0*  2.0* 2.0*  2.0* 2.0* 2.0*  2.1* 1.9* 1.9* 2.0*    CBC:  Recent Labs Lab 01/25/15 0550 01/26/15 0333 01/27/15 0525 01/28/15 0445 01/29/15 0505  WBC 6.9 8.0 8.0 7.8 9.0  HGB 8.2* 7.9* 8.3* 7.7* 7.8*  HCT 23.5* 23.2* 24.7* 23.4* 23.5*  MCV 87.4 88.2 89.5 90.7 91.1  PLT 170 246 258 284 284    CBG:  Recent Labs Lab 01/23/15 1622 01/24/15 1544  GLUCAP 76 97    Recent Results (from the past 240 hour(s))  Culture, blood (routine x 2)     Status: None   Collection Time: 01/20/15  3:48 PM  Result Value Ref Range Status   Specimen Description BLOOD PORTA CATH  Final   Special Requests BOTTLES DRAWN AEROBIC AND ANAEROBIC  Final   Culture NO GROWTH 5 DAYS  Final   Report Status 01/25/2015 FINAL  Final  Culture, blood (routine x 2)     Status: None   Collection Time: 01/20/15  4:25 PM  Result Value Ref Range Status   Specimen Description BLOOD PORTA CATH  Final   Special Requests BOTTLES  DRAWN AEROBIC AND ANAEROBIC  Final   Culture NO GROWTH 5 DAYS  Final   Report Status 01/25/2015 FINAL  Final  Culture, blood (routine x 2)     Status: None   Collection Time: 01/22/15  3:25 PM  Result Value Ref Range Status   Specimen Description BLOOD LEFT ARM  Final   Special Requests BOTTLES  DRAWN AEROBIC ONLY 5CC  Final   Culture NO GROWTH 5 DAYS  Final   Report Status 01/27/2015 FINAL  Final  Culture, blood (routine x 2)     Status: None   Collection Time: 01/22/15  3:30 PM  Result Value Ref Range Status   Specimen Description BLOOD RIGHT ARM  Final   Special Requests BOTTLES DRAWN AEROBIC ONLY 2CC  Final   Culture NO GROWTH 5 DAYS  Final   Report Status 01/27/2015 FINAL  Final  Culture, Urine     Status: None   Collection Time: 01/22/15  6:03 PM  Result Value Ref Range Status   Specimen Description URINE, CATHETERIZED  Final   Special Requests NONE  Final   Culture NO GROWTH 1 DAY  Final   Report Status 01/23/2015 FINAL  Final  Culture, blood (routine x 2)     Status: None (Preliminary result)   Collection Time: 01/29/15 10:45 PM  Result Value Ref Range Status   Specimen Description BLOOD RIGHT ANTECUBITAL  Final   Special Requests   Final    BOTTLES DRAWN AEROBIC AND ANAEROBIC 10CC EACH ANCEF   Culture NO GROWTH < 24 HOURS  Final   Report Status PENDING  Incomplete  Culture, blood (routine x 2)     Status: None (Preliminary result)   Collection Time: 01/29/15 11:00 PM  Result Value Ref Range Status   Specimen Description BLOOD LEFT ANTECUBITAL  Final   Special Requests   Final    BOTTLES DRAWN AEROBIC AND ANAEROBIC 10CC EACH ANCEF   Culture NO GROWTH < 24 HOURS  Final   Report Status PENDING  Incomplete       Scheduled Meds:  Scheduled Meds: . sodium chloride   Intravenous Once  . sodium chloride   Intravenous Once  . antiseptic oral rinse  7 mL Mouth Rinse QID  . aspirin  81 mg Oral Daily  . chlorhexidine gluconate  15 mL Mouth Rinse BID  . darbepoetin  (ARANESP) injection - DIALYSIS  100 mcg Intravenous Q Mon-HD  . feeding supplement (NEPRO CARB STEADY)  237 mL Oral BID BM  . heparin subcutaneous  5,000 Units Subcutaneous 3 times per day  . multivitamin with minerals  1 tablet Oral Daily  . pantoprazole  40 mg Oral QHS  . sodium chloride  10 mL Intravenous Q12H    Time spent on care of this patient: 30 mins  Zannie Cove [phone] [facility] For Consults/Admissions - Flow Manager - [phone] Office  [phone]  Contact MD directly via text page:      [email or web address]      password [facility]  01/30/2015, 3:08 PM   LOS: 22 days

## 2015-01-30 NOTE — Care Management Important Message (Signed)
Important Message  Patient Details  Name: Jared [name] MRN: [id number] Date of Birth: [birth year]   Medicare Important Message Given:  Yes-third notification given    Orson Aloe 01/30/2015, 11:56 AM

## 2015-01-30 NOTE — Progress Notes (Signed)
Patient ID: BRAIDON CHERMAK, male   DOB: 12-May-1956, 59 y.o.   MRN: [id number]  S:  Had no UOP recorded- he says he is making some-  but BUN and creatinine going up without HD- got bumped from schedule- is on HD this AM    O:BP 170/80 mmHg  Pulse 70  Temp(Src) 99.1 F (37.3 C) (Oral)  Resp 18  Wt 126 kg (277 lb 12.5 oz)  SpO2 96%  Intake/Output Summary (Last 24 hours) at 01/30/15 0803 Last data filed at 01/29/15 1948  Gross per 24 hour  Intake    222 ml  Output      0 ml  Net    222 ml   Intake/Output: I/O last 3 completed shifts: In: 2923 [P.O.:462; I.V.:2461] Out: -   Intake/Output this shift:    Weight change: 8.751 kg (19 lb 4.7 oz) Gen: alert, seen on HD Heart: RRR, no m/r/g Resp:bibasilar crackles.  Abd:+BS, binder in place, tender to palpation EXT: trace - 1+ edema Neuro: awake today,  Clearer by the day   Recent Labs Lab 01/24/15 0445 01/25/15 0550 01/26/15 0333 01/27/15 0525 01/28/15 0445 01/29/15 0505 01/30/15 0457  NA 134* 134* 134* 137 133* 134* 136  K 3.6 3.7 3.7 4.1 3.8 4.3 4.3  CL 97* 98* 97* 100* 98* 98* 100*  CO2 25 21* $Remo'23 27 26 24 24  'IKNyN$ GLUCOSE 86 80 99 105* 103* 92 93  BUN 61* 77* 87* 39* 51* 58* 67*  CREATININE 10.09* 12.61* 14.55* 9.33* 11.42* 13.24* 14.94*  ALBUMIN 2.0*  2.0* 2.0*  2.0* 2.0* 2.0*  2.1* 1.9* 1.9* 2.0*  CALCIUM 8.0* 8.0* 8.1* 7.9* 7.8* 7.9* 8.1*  PHOS 4.7* 6.8* 7.3* 4.5 5.0* 5.0* 6.4*  AST 54* 45*  --  50*  --   --   --   ALT 84* 70*  --  65*  --   --   --    Liver Function Tests:  Recent Labs Lab 01/24/15 0445 01/25/15 0550  01/27/15 0525 01/28/15 0445 01/29/15 0505 01/30/15 0457  AST 54* 45*  --  50*  --   --   --   ALT 84* 70*  --  65*  --   --   --   ALKPHOS 148* 138*  --  145*  --   --   --   BILITOT 2.1* 2.0*  --  1.6*  --   --   --   PROT 5.1* 5.1*  --  5.3*  --   --   --   ALBUMIN 2.0*  2.0* 2.0*  2.0*  < > 2.0*  2.1* 1.9* 1.9* 2.0*  < > = values in this interval not displayed. No results for  input(s): LIPASE, AMYLASE in the last 168 hours.  Recent Labs Lab 01/26/15 0333  AMMONIA 38*   CBC:  Recent Labs Lab 01/25/15 0550 01/26/15 0333 01/27/15 0525 01/28/15 0445 01/29/15 0505  WBC 6.9 8.0 8.0 7.8 9.0  HGB 8.2* 7.9* 8.3* 7.7* 7.8*  HCT 23.5* 23.2* 24.7* 23.4* 23.5*  MCV 87.4 88.2 89.5 90.7 91.1  PLT 170 246 258 284 284   Cardiac Enzymes: No results for input(s): CKTOTAL, CKMB, CKMBINDEX, TROPONINI in the last 168 hours. CBG:  Recent Labs Lab 01/23/15 0856 01/23/15 1622 01/24/15 1544  GLUCAP 78 76 97    Iron Studies:  No results for input(s): IRON, TIBC, TRANSFERRIN, FERRITIN in the last 72 hours. Studies/Results: Nm Pulmonary Perf And Vent  01/28/2015  CLINICAL DATA:  Hypoxemia and elevated pulmonary arterial pressure  EXAM: NUCLEAR MEDICINE VENTILATION - PERFUSION LUNG SCAN  TECHNIQUE: Ventilation images were obtained in multiple projections using inhaled aerosol Tc-29m DTPA. Perfusion images were obtained in multiple projections after intravenous injection of Tc-65m MAA.  RADIOPHARMACEUTICALS:  41 mCi Technetium-32m DTPA aerosol inhalation and 6 mCi Technetium-53m MAA IV  COMPARISON:  01/22/2015  FINDINGS: Ventilation: No focal ventilation defect. Cardiomegaly contributes to a large central defect as due to the enlarged pulmonary arteries.  Perfusion: No wedge shaped peripheral perfusion defects to suggest acute pulmonary embolism. Large central defects are noted related to cardiomegaly and enlarged pulmonary arteries.  IMPRESSION: No ventilation-perfusion mismatch to suggest pulmonary embolism.   Electronically Signed   By: Inez Catalina M.D.   On: 01/28/2015 15:51   Ir Fluoro Guide Cv Line Right  01/29/2015   INDICATION: 59 year old male with acute renal failure in need of hemodialysis.  EXAM: TUNNELED CENTRAL VENOUS HEMODIALYSIS CATHETER PLACEMENT WITH ULTRASOUND AND FLUOROSCOPIC GUIDANCE  MEDICATIONS: Ancef 3 gm IV; The IV antibiotic was given in an appropriate  time interval prior to skin puncture.  CONTRAST:  None  ANESTHESIA/SEDATION: Versed 6 mg IV; Fentanyl 200 mcg IV  Total Moderate Sedation Time  30 minutes.  FLUOROSCOPY TIME:  2 minutes 0 seconds  16.1 mGy  COMPLICATIONS: None immediate  PROCEDURE: Informed written consent was obtained from the patient after a discussion of the risks, benefits, and alternatives to treatment. Questions regarding the procedure were encouraged and answered. The right neck and chest were prepped with chlorhexidine in a sterile fashion, and a sterile drape was applied covering the operative field. Maximum barrier sterile technique with sterile gowns and gloves were used for the procedure. A timeout was performed prior to the initiation of the procedure.  After creating a small venotomy incision, a micropuncture kit was utilized to access the right internal jugular vein under direct, real-time ultrasound guidance after the overlying soft tissues were anesthetized with 1% lidocaine with epinephrine. Ultrasound image documentation was performed. The microwire was kinked to measure appropriate catheter length. A stiff glidewire was advanced to the level of the IVC and the micropuncture sheath was exchanged for a peel-away sheath. A Palindrome tunneled hemodialysis catheter measuring 28 cm from tip to cuff was tunneled in a retrograde fashion from the anterior chest wall to the venotomy incision.  The catheter was then placed through the peel-away sheath with tips ultimately positioned within the superior aspect of the right atrium. Final catheter positioning was confirmed and documented with a spot radiographic image. The catheter aspirates and flushes normally. The catheter was flushed with appropriate volume heparin dwells.  The catheter exit site was secured with a 0-Prolene retention suture. The venotomy incision was closed with Dermabond. Dressings were applied. The patient tolerated the procedure well without immediate post procedural  complication.  IMPRESSION: Successful placement of 28 cm tip to cuff tunneled hemodialysis catheter via the right internal jugular vein with tips terminating within the mid right atrium. The catheter is ready for immediate use.  Signed,  Criselda Peaches, MD  [facility]  [facility]   Electronically Signed   By: Jacqulynn Cadet M.D.   On: 01/29/2015 13:18   Ir US Guide Vasc Access Right  01/29/2015   INDICATION: 59 year old male with acute renal failure in need of hemodialysis.  EXAM: TUNNELED CENTRAL VENOUS HEMODIALYSIS CATHETER PLACEMENT WITH ULTRASOUND AND FLUOROSCOPIC GUIDANCE  MEDICATIONS: Ancef 3 gm IV; The IV antibiotic was given  in an appropriate time interval prior to skin puncture.  CONTRAST:  None  ANESTHESIA/SEDATION: Versed 6 mg IV; Fentanyl 200 mcg IV  Total Moderate Sedation Time  30 minutes.  FLUOROSCOPY TIME:  2 minutes 0 seconds  42.8 mGy  COMPLICATIONS: None immediate  PROCEDURE: Informed written consent was obtained from the patient after a discussion of the risks, benefits, and alternatives to treatment. Questions regarding the procedure were encouraged and answered. The right neck and chest were prepped with chlorhexidine in a sterile fashion, and a sterile drape was applied covering the operative field. Maximum barrier sterile technique with sterile gowns and gloves were used for the procedure. A timeout was performed prior to the initiation of the procedure.  After creating a small venotomy incision, a micropuncture kit was utilized to access the right internal jugular vein under direct, real-time ultrasound guidance after the overlying soft tissues were anesthetized with 1% lidocaine with epinephrine. Ultrasound image documentation was performed. The microwire was kinked to measure appropriate catheter length. A stiff glidewire was advanced to the level of the IVC and the micropuncture sheath was exchanged for a peel-away sheath. A  Palindrome tunneled hemodialysis catheter measuring 28 cm from tip to cuff was tunneled in a retrograde fashion from the anterior chest wall to the venotomy incision.  The catheter was then placed through the peel-away sheath with tips ultimately positioned within the superior aspect of the right atrium. Final catheter positioning was confirmed and documented with a spot radiographic image. The catheter aspirates and flushes normally. The catheter was flushed with appropriate volume heparin dwells.  The catheter exit site was secured with a 0-Prolene retention suture. The venotomy incision was closed with Dermabond. Dressings were applied. The patient tolerated the procedure well without immediate post procedural complication.  IMPRESSION: Successful placement of 28 cm tip to cuff tunneled hemodialysis catheter via the right internal jugular vein with tips terminating within the mid right atrium. The catheter is ready for immediate use.  Signed,  Sterling Big, MD  [facility]  [facility]   Electronically Signed   By: Malachy Moan M.D.   On: 01/29/2015 13:18   Dg Chest Port 1 View  01/29/2015   CLINICAL DATA:  Fever today.  EXAM: PORTABLE CHEST - 1 VIEW  COMPARISON:  01/22/2015  FINDINGS: Lung bases are excluded from the field of view. Right central venous catheter with tip over the right atrial region. Left central venous catheter with tip at the confluence of the brachiocephalic and superior vena cava. Postoperative changes in the cervical spine. Shallow inspiration. Cardiac enlargement without vascular congestion or edema. No focal consolidation in the lungs. No pneumothorax.  IMPRESSION: Shallow inspiration. Cardiac enlargement. Appliances appear in satisfactory position.   Electronically Signed   By: Burman Nieves M.D.   On: 01/29/2015 22:39   . sodium chloride   Intravenous Once  . sodium chloride   Intravenous Once  . antiseptic oral rinse  7  mL Mouth Rinse QID  . aspirin  81 mg Oral Daily  . chlorhexidine gluconate  15 mL Mouth Rinse BID  . darbepoetin (ARANESP) injection - DIALYSIS  100 mcg Intravenous Q Mon-HD  . feeding supplement (NEPRO CARB STEADY)  237 mL Oral BID BM  . heparin subcutaneous  5,000 Units Subcutaneous 3 times per day  . HYDROmorphone      . multivitamin with minerals  1 tablet Oral Daily  . pantoprazole  40 mg Oral QHS  . sodium chloride  10 mL Intravenous Q12H    BMET    Component Value Date/Time   NA 136 01/30/2015 0457   K 4.3 01/30/2015 0457   CL 100* 01/30/2015 0457   CO2 24 01/30/2015 0457   GLUCOSE 93 01/30/2015 0457   BUN 67* 01/30/2015 0457   CREATININE 14.94* 01/30/2015 0457   CREATININE 1.15 12/17/2014 1707   CALCIUM 8.1* 01/30/2015 0457   GFRNONAA 3* 01/30/2015 0457   GFRNONAA 68 12/17/2014 1707   GFRAA 3* 01/30/2015 0457   GFRAA 79 12/17/2014 1707   CBC    Component Value Date/Time   WBC 9.0 01/29/2015 0505   RBC 2.58* 01/29/2015 0505   RBC 2.63* 01/26/2015 0333   HGB 7.8* 01/29/2015 0505   HCT 23.5* 01/29/2015 0505   PLT 284 01/29/2015 0505   MCV 91.1 01/29/2015 0505   MCH 30.2 01/29/2015 0505   MCHC 33.2 01/29/2015 0505   RDW 15.6* 01/29/2015 0505   LYMPHSABS 1.0 01/21/2015 0312   MONOABS 1.1* 01/21/2015 0312   EOSABS 1.0* 01/21/2015 0312   BASOSABS 0.1 01/21/2015 0312     Assessment/Plan:  1. AoCKD,- now calling ESRD-  presumably due to septic shock related ATN- creatinine 1.15 on July 27 1. CVVHD from 8/20-  01/14/15 and transitioned to intermittent HD 01/16/15- done basically TIW- last today 2.   temp HD cath removed --using PC placed 9/8 3. It seems he will clearly need more HD at this time - HD dependent since 8/20 almost 3 weeks- s/p [facility]- Will also need to CLIP to OP unit- ? Hold off on perm access given normal renal function in late July.  Anticipate will need to stay through weekend but hopeful for discharge early next week  2. SIRS- clinical PE (RA and  RV dilated, RV hypocontractile on ECHO). CT angio on hold for now (concerns for renal recovery as well as IV access).plan is long term anticoagulation - once gets [facility] can be started on coumadin- per primary  3. CAD- cath revealed occluded OM, cardiology following 4. Shock liver- improved.  5. S/p exploratory lap and fascia closure now s/p wound vac - with some bleeding, improved after holding anticoag, now back on anticoag.  6. Metabolic acidosis- resolved 7. Hyponatremia. - resolved 8. Thrombocytopenia- hit panel neg. Likely 2/2 to shocked liver. Now improved. Back on heparin - no bleeding currently.  9. Hypoalbuminemia- per primary svc 10. Nutrition: off TPN, taking some PO 11. Persistent Fevers: 8/30: temp HD cath removed.  Started Vanc.  B Cx x2 NGTD- resolved  12. Anemia- have added ESA - ferr over 1000 no iron - will give blood with HD today as well for hgb less than 8   Leeland Lovelady A

## 2015-01-30 NOTE — Progress Notes (Signed)
Pt continuing to refuse cpap at this time.  

## 2015-01-30 NOTE — Procedures (Signed)
Patient was seen on dialysis and the procedure was supervised.  BFR 400  Via PC BP is  160/79.   Patient appears to be tolerating treatment well  Jared [name] A 01/30/2015

## 2015-01-31 ENCOUNTER — Other Ambulatory Visit: Payer: Self-pay | Admitting: Internal Medicine

## 2015-01-31 LAB — TYPE AND SCREEN
ABO/RH(D): A POS
Antibody Screen: NEGATIVE
Unit division: 0
Unit division: 0

## 2015-01-31 LAB — RENAL FUNCTION PANEL
ALBUMIN: 2 g/dL — AB (ref 3.5–5.0)
ANION GAP: 10 (ref 5–15)
BUN: 39 mg/dL — ABNORMAL HIGH (ref 6–20)
CALCIUM: 8.1 mg/dL — AB (ref 8.9–10.3)
CO2: 29 mmol/L (ref 22–32)
CREATININE: 10.29 mg/dL — AB (ref 0.61–1.24)
Chloride: 99 mmol/L — ABNORMAL LOW (ref 101–111)
GFR calc non Af Amer: 5 mL/min — ABNORMAL LOW (ref >60)
GFR, EST AFRICAN AMERICAN: 5 mL/min — AB (ref >60)
GLUCOSE: 93 mg/dL (ref 65–99)
PHOSPHORUS: 5.1 mg/dL — AB (ref 2.5–4.6)
Potassium: 3.8 mmol/L (ref 3.5–5.1)
SODIUM: 138 mmol/L (ref 135–145)

## 2015-01-31 NOTE — Progress Notes (Signed)
Patient ID: Jared [name], male   DOB: [birth year], 59 y.o.   MRN: [id number]  S:  S/p HD early AM 9/9- removed 400 - 54 UOP recorded    O:BP 158/75 mmHg  Pulse 72  Temp(Src) 98.2 F (36.8 C) (Oral)  Resp 18  Wt 127.143 kg (280 lb 4.8 oz)  SpO2 97%  Intake/Output Summary (Last 24 hours) at 01/31/15 1029 Last data filed at 01/30/15 2207  Gross per 24 hour  Intake    462 ml  Output    925 ml  Net   -463 ml   Intake/Output: I/O last 3 completed shifts: In: 1119 [P.O.:684; Blood:435] Out: 4098 [Urine:925; Other:400]  Intake/Output this shift:    Weight change: -8.811 kg (-19 lb 6.8 oz) Gen: alert Heart: RRR, no m/r/g Resp:bibasilar crackles.  Abd:+BS, binder in place, tender to palpation EXT: trace - 1+ edema Neuro: awake today,  Clearer by the day   Recent Labs Lab 01/25/15 0550 01/26/15 0333 01/27/15 0525 01/28/15 0445 01/29/15 0505 01/30/15 0457 01/31/15 0529  NA 134* 134* 137 133* 134* 136 138  K 3.7 3.7 4.1 3.8 4.3 4.3 3.8  CL 98* 97* 100* 98* 98* 100* 99*  CO2 21* $Remov'23 27 26 24 24 29  'ZASNGk$ GLUCOSE 80 99 105* 103* 92 93 93  BUN 77* 87* 39* 51* 58* 67* 39*  CREATININE 12.61* 14.55* 9.33* 11.42* 13.24* 14.94* 10.29*  ALBUMIN 2.0*  2.0* 2.0* 2.0*  2.1* 1.9* 1.9* 2.0* 2.0*  CALCIUM 8.0* 8.1* 7.9* 7.8* 7.9* 8.1* 8.1*  PHOS 6.8* 7.3* 4.5 5.0* 5.0* 6.4* 5.1*  AST 45*  --  50*  --   --   --   --   ALT 70*  --  65*  --   --   --   --    Liver Function Tests:  Recent Labs Lab 01/25/15 0550  01/27/15 0525  01/29/15 0505 01/30/15 0457 01/31/15 0529  AST 45*  --  50*  --   --   --   --   ALT 70*  --  65*  --   --   --   --   ALKPHOS 138*  --  145*  --   --   --   --   BILITOT 2.0*  --  1.6*  --   --   --   --   PROT 5.1*  --  5.3*  --   --   --   --   ALBUMIN 2.0*  2.0*  < > 2.0*  2.1*  < > 1.9* 2.0* 2.0*  < > = values in this interval not displayed. No results for input(s): LIPASE, AMYLASE in the last 168 hours.  Recent Labs Lab 01/26/15 0333  AMMONIA  38*   CBC:  Recent Labs Lab 01/25/15 0550 01/26/15 0333 01/27/15 0525 01/28/15 0445 01/29/15 0505  WBC 6.9 8.0 8.0 7.8 9.0  HGB 8.2* 7.9* 8.3* 7.7* 7.8*  HCT 23.5* 23.2* 24.7* 23.4* 23.5*  MCV 87.4 88.2 89.5 90.7 91.1  PLT 170 246 258 284 284   Cardiac Enzymes: No results for input(s): CKTOTAL, CKMB, CKMBINDEX, TROPONINI in the last 168 hours. CBG:  Recent Labs Lab 01/24/15 1544  GLUCAP 97    Iron Studies:  No results for input(s): IRON, TIBC, TRANSFERRIN, FERRITIN in the last 72 hours. Studies/Results: Ir Fluoro Guide Cv Line Right  01/29/2015   INDICATION: 59 year old male with acute renal failure in need of hemodialysis.  EXAM: TUNNELED CENTRAL VENOUS  HEMODIALYSIS CATHETER PLACEMENT WITH ULTRASOUND AND FLUOROSCOPIC GUIDANCE  MEDICATIONS: Ancef 3 gm IV; The IV antibiotic was given in an appropriate time interval prior to skin puncture.  CONTRAST:  None  ANESTHESIA/SEDATION: Versed 6 mg IV; Fentanyl 200 mcg IV  Total Moderate Sedation Time  30 minutes.  FLUOROSCOPY TIME:  2 minutes 0 seconds  14.4 mGy  COMPLICATIONS: None immediate  PROCEDURE: Informed written consent was obtained from the patient after a discussion of the risks, benefits, and alternatives to treatment. Questions regarding the procedure were encouraged and answered. The right neck and chest were prepped with chlorhexidine in a sterile fashion, and a sterile drape was applied covering the operative field. Maximum barrier sterile technique with sterile gowns and gloves were used for the procedure. A timeout was performed prior to the initiation of the procedure.  After creating a small venotomy incision, a micropuncture kit was utilized to access the right internal jugular vein under direct, real-time ultrasound guidance after the overlying soft tissues were anesthetized with 1% lidocaine with epinephrine. Ultrasound image documentation was performed. The microwire was kinked to measure appropriate catheter length. A  stiff glidewire was advanced to the level of the IVC and the micropuncture sheath was exchanged for a peel-away sheath. A Palindrome tunneled hemodialysis catheter measuring 28 cm from tip to cuff was tunneled in a retrograde fashion from the anterior chest wall to the venotomy incision.  The catheter was then placed through the peel-away sheath with tips ultimately positioned within the superior aspect of the right atrium. Final catheter positioning was confirmed and documented with a spot radiographic image. The catheter aspirates and flushes normally. The catheter was flushed with appropriate volume heparin dwells.  The catheter exit site was secured with a 0-Prolene retention suture. The venotomy incision was closed with Dermabond. Dressings were applied. The patient tolerated the procedure well without immediate post procedural complication.  IMPRESSION: Successful placement of 28 cm tip to cuff tunneled hemodialysis catheter via the right internal jugular vein with tips terminating within the mid right atrium. The catheter is ready for immediate use.  Signed,  Criselda Peaches, MD  [facility]  [facility]   Electronically Signed   By: Jacqulynn Cadet M.D.   On: 01/29/2015 13:18   Ir US Guide Vasc Access Right  01/29/2015   INDICATION: 59 year old male with acute renal failure in need of hemodialysis.  EXAM: TUNNELED CENTRAL VENOUS HEMODIALYSIS CATHETER PLACEMENT WITH ULTRASOUND AND FLUOROSCOPIC GUIDANCE  MEDICATIONS: Ancef 3 gm IV; The IV antibiotic was given in an appropriate time interval prior to skin puncture.  CONTRAST:  None  ANESTHESIA/SEDATION: Versed 6 mg IV; Fentanyl 200 mcg IV  Total Moderate Sedation Time  30 minutes.  FLUOROSCOPY TIME:  2 minutes 0 seconds  31.5 mGy  COMPLICATIONS: None immediate  PROCEDURE: Informed written consent was obtained from the patient after a discussion of the risks, benefits, and alternatives to treatment.  Questions regarding the procedure were encouraged and answered. The right neck and chest were prepped with chlorhexidine in a sterile fashion, and a sterile drape was applied covering the operative field. Maximum barrier sterile technique with sterile gowns and gloves were used for the procedure. A timeout was performed prior to the initiation of the procedure.  After creating a small venotomy incision, a micropuncture kit was utilized to access the right internal jugular vein under direct, real-time ultrasound guidance after the overlying soft tissues were anesthetized with 1% lidocaine with epinephrine. Ultrasound image documentation was  performed. The microwire was kinked to measure appropriate catheter length. A stiff glidewire was advanced to the level of the IVC and the micropuncture sheath was exchanged for a peel-away sheath. A Palindrome tunneled hemodialysis catheter measuring 28 cm from tip to cuff was tunneled in a retrograde fashion from the anterior chest wall to the venotomy incision.  The catheter was then placed through the peel-away sheath with tips ultimately positioned within the superior aspect of the right atrium. Final catheter positioning was confirmed and documented with a spot radiographic image. The catheter aspirates and flushes normally. The catheter was flushed with appropriate volume heparin dwells.  The catheter exit site was secured with a 0-Prolene retention suture. The venotomy incision was closed with Dermabond. Dressings were applied. The patient tolerated the procedure well without immediate post procedural complication.  IMPRESSION: Successful placement of 28 cm tip to cuff tunneled hemodialysis catheter via the right internal jugular vein with tips terminating within the mid right atrium. The catheter is ready for immediate use.  Signed,  Criselda Peaches, MD  [facility]  [facility]   Electronically Signed   By: Jacqulynn Cadet M.D.   On: 01/29/2015 13:18   Dg Chest Port 1 View  01/29/2015   CLINICAL DATA:  Fever today.  EXAM: PORTABLE CHEST - 1 VIEW  COMPARISON:  01/22/2015  FINDINGS: Lung bases are excluded from the field of view. Right central venous catheter with tip over the right atrial region. Left central venous catheter with tip at the confluence of the brachiocephalic and superior vena cava. Postoperative changes in the cervical spine. Shallow inspiration. Cardiac enlargement without vascular congestion or edema. No focal consolidation in the lungs. No pneumothorax.  IMPRESSION: Shallow inspiration. Cardiac enlargement. Appliances appear in satisfactory position.   Electronically Signed   By: Lucienne Capers M.D.   On: 01/29/2015 22:39   . antiseptic oral rinse  7 mL Mouth Rinse QID  . aspirin  81 mg Oral Daily  . chlorhexidine gluconate  15 mL Mouth Rinse BID  . darbepoetin (ARANESP) injection - DIALYSIS  100 mcg Intravenous Q Mon-HD  . feeding supplement (NEPRO CARB STEADY)  237 mL Oral BID BM  . heparin subcutaneous  5,000 Units Subcutaneous 3 times per day  . multivitamin with minerals  1 tablet Oral Daily  . pantoprazole  40 mg Oral QHS  . sodium chloride  10 mL Intravenous Q12H    BMET    Component Value Date/Time   NA 138 01/31/2015 0529   K 3.8 01/31/2015 0529   CL 99* 01/31/2015 0529   CO2 29 01/31/2015 0529   GLUCOSE 93 01/31/2015 0529   BUN 39* 01/31/2015 0529   CREATININE 10.29* 01/31/2015 0529   CREATININE 1.15 12/17/2014 1707   CALCIUM 8.1* 01/31/2015 0529   GFRNONAA 5* 01/31/2015 0529   GFRNONAA 68 12/17/2014 1707   GFRAA 5* 01/31/2015 0529   GFRAA 79 12/17/2014 1707   CBC    Component Value Date/Time   WBC 9.0 01/29/2015 0505   RBC 2.58* 01/29/2015 0505   RBC 2.63* 01/26/2015 0333   HGB 7.8* 01/29/2015 0505   HCT 23.5* 01/29/2015 0505   PLT 284 01/29/2015 0505   MCV 91.1 01/29/2015 0505   MCH 30.2 01/29/2015 0505   MCHC 33.2 01/29/2015 0505   RDW 15.6*  01/29/2015 0505   LYMPHSABS 1.0 01/21/2015 0312   MONOABS 1.1* 01/21/2015 0312   EOSABS 1.0* 01/21/2015 0312   BASOSABS 0.1 01/21/2015 8088  Assessment/Plan:  1. AoCKD,- now calling ESRD-  presumably due to septic shock related ATN- creatinine 1.15 on July 27 1. CVVHD from 8/20-  01/14/15 and transitioned to intermittent HD 01/16/15- done basically TIW- last 9/9 2.   temp HD cath removed --using PC placed 9/8 3. It seems he will clearly need more HD at this time - HD dependent since 8/20 almost 3 weeks- s/p [facility]- Anticipate will need HD again on Monday but will watch labs over weekend.  Will also need to CLIP to OP unit- ? Hold off on perm access given normal renal function in late July.  Anticipate will need to stay through weekend but hopeful for discharge early next week once OP spot arranged  2. SIRS- clinical PE (RA and RV dilated, RV hypocontractile on ECHO). plan was long term anticoagulation - vq low prob so heparin stopped 3. CAD- cath revealed occluded OM, cardiology following 4. Shock liver- improved.  5. S/p exploratory lap and fascia closure now s/p wound vac - with some bleeding, improved after holding anticoag, now back on anticoag.  6. Metabolic acidosis- resolved 7. Hyponatremia. - resolved 8. Thrombocytopenia- hit panel neg. Likely 2/2 to shocked liver. Now improved. Back on heparin - no bleeding currently.  9. Hypoalbuminemia- per primary svc 10. Nutrition: off TPN, taking some PO 11. Persistent Fevers: 8/30: temp HD cath removed.  Started Vanc.  B Cx x2 NGTD- resolved  12. Anemia- have added ESA - ferr over 1000 no iron - given blood with HD 9/9 as well for hgb less than 8 13. Bones- phos is 5.1 no binder- will check intact PTH    Dustine Bertini A

## 2015-01-31 NOTE — Progress Notes (Signed)
[facility] and [facility] TEAM 1 TRANSFER Progress Note  Jared [name] ZRA:[id number] DOB: May 29, 1955 DOA: 01/08/2015 PCP: Nadean Corwin, MD  Admit HPI / Brief Narrative: 59 year old M Hx Depression, Anxiety, HTN, HLD, OSA, and Chronic Back Pain who presented 8/18 after being found down at home. Responsive to narcan via EMS. In ED was profoundly hypotensive with elevated WBC, refractory to volume. In ER noted to have lactate 8.5, hyperkalemia and started on pressors. PCCM admitted the pt.    Significant Events: 8/18 Admit - [facility], Cardiology consulted; brief PEA arrest; exploratory laparotomy with placement of wound vac; started heparin gtt for presumed PE 8/20 CRRT  8/21 OR for abd wound closure  8/22 Anuric. On CRRT. Off pressors. Improved Shock Liver. On fent gtt and heparing gtt. 40% fio2 8/23 ileus 8/23 Cardiac catheterization left circumflex, second obtuse marginal branch,Ost 2d Mrg-2d Mrg lesion or percent stenosed 8/25 HIT Ab screen normal - TNA initiated  8/26 Bleeding around abd wound - surface bled and IV heparin held  9/5 patient was transferred to telemetry floor , received HD on 9/5  HPI/Subjective: Feels well, breathing ok, eager to work with PT  Assessment/Plan:  Sepsis with presumed abdominal source > exp lap unrevealing -improved, source unclear  -Vanc stopped 8/23 - Zosyn stopped 8/26 - temp up to 101 8/30 - Vanc resumed 8/30 - HD catheter removed 8/30 - new temp cath placed 8/31 right femoral - WBC has normalized - the patient is afebrile  - follow up blood cultures negative 4 thus far  Acute renal failure/ATN with metab acidosis and hyperkalemia in CKD  - ATN from sepsis, Renal following - CRRT started per renal 8/20 - stopped 01/15/15  - On HD since 01/16/15, last HD 9/5 - RF and pan autoimmune negative 01/14/15 - baseline creatinine 1.14 from 12/17/14 - per Nephrology, s/p Tunneled HD cath 9/8  -on HD per Renal, being clipped for Outpatient HD  RV  failure -RA and RV dliated w/ R heart failure on TTE  -no CTA due to renal failure , venous duplex w/o evidence of DVT  -was on IV heparin, VQ scan with low probability of PE and hence stopped IV heparin  Anemia  -due to acute illness/critical illness -1 unit PRBC transfused 9/9  CAD Cardiac cath noted an occluded OM - Cardiology has evaluated and plans no further cardiac workup  Acute abdominal pain s/p laparotomy -wound VAC now discontinued - continue with wound care per general surgery recommendations  Metabolic encephalopathy -Appears to be multifactorial  -resolved  OSA -CPAP per respiratory, though patient is frequently refusing  Anemia/thrombocytopenia - Most likely multifactorial to include acute illness, sepsis - HIT panel negative - platelet count is climbing  Shock liver -LFTs have nearly normalized  A- Fib -Remains in NSR - off amio since 01/11/15  Systolic CHF/RV dilation -volume management per HD   Pulmonary hypertension  Hyperglycemia  -CBG currently normal   DVT prophylaxis: Heparin SQ  Code Status: FULL Family Communication: None at bedside Disposition Plan: - home early next week, s/p clipping for HD  Consultants: PCCM Gen Surgery  [facility]  Nephrology  Procedure/Significant Events: 8/18 Echo - EF 45 to 50%, mod RV dilation, mod/severe RV dysfx, PAS 47 mmHg 8/18 Renal u/s - b/l renal echogenicity  Objective: Blood pressure 158/75, pulse 72, temperature 98.2 F (36.8 C), temperature source Oral, resp. rate 18, weight 127.143 kg (280 lb 4.8 oz), SpO2 97 %.  Intake/Output Summary (Last 24 hours) at 01/31/15  1406 Last data filed at 01/30/15 2207  Gross per 24 hour  Intake    342 ml  Output    425 ml  Net    -83 ml   Exam: General: No acute respiratory distress - alert  HEENt: TLC in neck Lungs: Clear to auscultation bilaterally without wheeze  Cardiovascular: Regular rate and rhythm without murmur  Abdomen: Large midline  wound dressed and dry, faint bowel sounds, soft, no rebound  Extremities: No significant cyanosis or clubbing - 1+ B LE edema, HD cath in R groin  Data Reviewed: Basic Metabolic Panel:  Recent Labs Lab 01/27/15 0525 01/28/15 0445 01/29/15 0505 01/30/15 0457 01/31/15 0529  NA 137 133* 134* 136 138  K 4.1 3.8 4.3 4.3 3.8  CL 100* 98* 98* 100* 99*  CO2 GLUCOSE 105* 103* 92 93 93  BUN 39* 51* 58* 67* 39*  CREATININE 9.33* 11.42* 13.24* 14.94* 10.29*  CALCIUM 7.9* 7.8* 7.9* 8.1* 8.1*  PHOS 4.5 5.0* 5.0* 6.4* 5.1*     Liver Function Tests:  Recent Labs Lab 01/25/15 0550  01/27/15 0525 01/28/15 0445 01/29/15 0505 01/30/15 0457 01/31/15 0529  AST 45*  --  50*  --   --   --   --   ALT 70*  --  65*  --   --   --   --   ALKPHOS 138*  --  145*  --   --   --   --   BILITOT 2.0*  --  1.6*  --   --   --   --   PROT 5.1*  --  5.3*  --   --   --   --   ALBUMIN 2.0*  2.0*  < > 2.0*  2.1* 1.9* 1.9* 2.0* 2.0*  < > = values in this interval not displayed.  CBC:  Recent Labs Lab 01/25/15 0550 01/26/15 0333 01/27/15 0525 01/28/15 0445 01/29/15 0505  WBC 6.9 8.0 8.0 7.8 9.0  HGB 8.2* 7.9* 8.3* 7.7* 7.8*  HCT 23.5* 23.2* 24.7* 23.4* 23.5*  MCV 87.4 88.2 89.5 90.7 91.1  PLT 170 246 258 284 284    CBG:  Recent Labs Lab 01/24/15 1544  GLUCAP 97    Recent Results (from the past 240 hour(s))  Culture, blood (routine x 2)     Status: None   Collection Time: 01/22/15  3:25 PM  Result Value Ref Range Status   Specimen Description BLOOD LEFT ARM  Final   Special Requests BOTTLES DRAWN AEROBIC ONLY 5CC  Final   Culture NO GROWTH 5 DAYS  Final   Report Status 01/27/2015 FINAL  Final  Culture, blood (routine x 2)     Status: None   Collection Time: 01/22/15  3:30 PM  Result Value Ref Range Status   Specimen Description BLOOD RIGHT ARM  Final   Special Requests BOTTLES DRAWN AEROBIC ONLY 2CC  Final   Culture NO GROWTH 5 DAYS  Final   Report Status  01/27/2015 FINAL  Final  Culture, Urine     Status: None   Collection Time: 01/22/15  6:03 PM  Result Value Ref Range Status   Specimen Description URINE, CATHETERIZED  Final   Special Requests NONE  Final   Culture NO GROWTH 1 DAY  Final   Report Status 01/23/2015 FINAL  Final  Culture, blood (routine x 2)     Status: None (Preliminary result)   Collection Time: 01/29/15 10:45 PM  Result Value Ref Range Status   Specimen Description BLOOD RIGHT ANTECUBITAL  Final   Special Requests   Final    BOTTLES DRAWN AEROBIC AND ANAEROBIC 10CC EACH ANCEF   Culture NO GROWTH 2 DAYS  Final   Report Status PENDING  Incomplete  Culture, blood (routine x 2)     Status: None (Preliminary result)   Collection Time: 01/29/15 11:00 PM  Result Value Ref Range Status   Specimen Description BLOOD LEFT ANTECUBITAL  Final   Special Requests   Final    BOTTLES DRAWN AEROBIC AND ANAEROBIC 10CC EACH ANCEF   Culture NO GROWTH 2 DAYS  Final   Report Status PENDING  Incomplete       Scheduled Meds:  Scheduled Meds: . antiseptic oral rinse  7 mL Mouth Rinse QID  . aspirin  81 mg Oral Daily  . chlorhexidine gluconate  15 mL Mouth Rinse BID  . darbepoetin (ARANESP) injection - DIALYSIS  100 mcg Intravenous Q Mon-HD  . feeding supplement (NEPRO CARB STEADY)  237 mL Oral BID BM  . heparin subcutaneous  5,000 Units Subcutaneous 3 times per day  . multivitamin with minerals  1 tablet Oral Daily  . pantoprazole  40 mg Oral QHS  . sodium chloride  10 mL Intravenous Q12H    Time spent on care of this patient: 30 mins  Zannie Cove [phone] [facility] For Consults/Admissions - Flow Manager - [phone] Office  [phone]  Contact MD directly via text page:      [email or web address]      password [facility]  01/31/2015, 2:06 PM   LOS: 23 days

## 2015-02-01 LAB — RENAL FUNCTION PANEL
Albumin: 2 g/dL — ABNORMAL LOW (ref 3.5–5.0)
Anion gap: 12 (ref 5–15)
BUN: 51 mg/dL — AB (ref 6–20)
CHLORIDE: 100 mmol/L — AB (ref 101–111)
CO2: 26 mmol/L (ref 22–32)
CREATININE: 11.59 mg/dL — AB (ref 0.61–1.24)
Calcium: 8.1 mg/dL — ABNORMAL LOW (ref 8.9–10.3)
GFR calc Af Amer: 5 mL/min — ABNORMAL LOW (ref >60)
GFR calc non Af Amer: 4 mL/min — ABNORMAL LOW (ref >60)
GLUCOSE: 91 mg/dL (ref 65–99)
POTASSIUM: 4.1 mmol/L (ref 3.5–5.1)
Phosphorus: 5.7 mg/dL — ABNORMAL HIGH (ref 2.5–4.6)
Sodium: 138 mmol/L (ref 135–145)

## 2015-02-01 NOTE — Progress Notes (Signed)
PCCM and [place] TEAM 1 TRANSFER Progress Note  Jared [name] WGN:[id number] DOB: 01/30/1956 DOA: 01/08/2015 PCP: Nadean Corwin, MD  Admit HPI / Brief Narrative: 59 year old M Hx Depression, Anxiety, HTN, HLD, OSA, and Chronic Back Pain who presented 8/18 after being found down at home. Responsive to narcan via EMS. In ED was profoundly hypotensive with elevated WBC, refractory to volume. In ER noted to have lactate 8.5, hyperkalemia and started on pressors. PCCM admitted the pt.    Significant Events: 8/18 Admit - CCS, Cardiology consulted; brief PEA arrest; exploratory laparotomy with placement of wound vac; started heparin gtt for presumed PE 8/20 CRRT  8/21 OR for abd wound closure  8/22 Anuric. On CRRT. Off pressors. Improved Shock Liver. On fent gtt and heparing gtt. 40% fio2 8/23 ileus 8/23 Cardiac catheterization left circumflex, second obtuse marginal branch,Ost 2d Mrg-2d Mrg lesion or percent stenosed 8/25 HIT Ab screen normal - TNA initiated  8/26 Bleeding around abd wound - surface bled and IV heparin held  9/5 patient was transferred to telemetry floor , received HD on 9/5  HPI/Subjective: Feels well, breathing ok, abd wound healing well, ambulating with family  Assessment/Plan:  Sepsis with presumed abdominal source > exp lap unrevealing -improved, source unclear  -Vanc stopped 8/23 - Zosyn stopped 8/26 - temp up to 101 8/30 - Vanc resumed 8/30 - HD catheter removed 8/30 - new temp cath placed 8/31 right femoral - WBC has normalized - the patient is afebrile  - follow up blood cultures negative 4 thus far  Acute renal failure/ATN with metab acidosis and hyperkalemia in CKD  - ATN from sepsis, Renal following - CRRT started per renal 8/20 - stopped 01/15/15  - On HD since 01/16/15, last HD 9/5 - RF and pan autoimmune negative 01/14/15 - baseline creatinine 1.14 from 12/17/14 - per Nephrology, s/p Tunneled HD cath 9/8  -on HD per Renal, being clipped for  Outpatient HD  RV failure -RA and RV dliated w/ R heart failure on TTE  -no CTA due to renal failure , venous duplex w/o evidence of DVT  -was on IV heparin, VQ scan with low probability of PE and hence stopped IV heparin  Anemia  -due to acute illness/critical illness -1 unit PRBC transfused 9/9  CAD Cardiac cath noted an occluded OM - Cardiology has evaluated and plans no further cardiac workup  Acute abdominal pain s/p laparotomy -wound VAC now discontinued - continue with wound care per general surgery recommendations -healing well  Metabolic encephalopathy -Appears to be multifactorial  -resolved  OSA -CPAP per respiratory, though patient is frequently refusing  Anemia/thrombocytopenia - Most likely multifactorial to include acute illness, sepsis - HIT panel negative - platelet count is climbing  Shock liver -LFTs have nearly normalized  A- Fib -Remains in NSR - off amio since 01/11/15  Systolic CHF/RV dilation -volume management per HD   Pulmonary hypertension  Hyperglycemia  -CBG currently normal   DVT prophylaxis: Heparin SQ  Code Status: FULL Family Communication: None at bedside Disposition Plan: - home in next 1-2days, s/p clipping for HD  Consultants: PCCM Gen Surgery  [facility] Cardiology  Nephrology  Procedure/Significant Events: 8/18 Echo - EF 45 to 50%, mod RV dilation, mod/severe RV dysfx, PAS 47 mmHg 8/18 Renal u/s - b/l renal echogenicity  Objective: Blood pressure 162/65, pulse 70, temperature 97.8 F (36.6 C), temperature source Oral, resp. rate 20, weight 126.916 kg (279 lb 12.8 oz), SpO2 97 %.  Intake/Output Summary (Last  24 hours) at 02/01/15 1455 Last data filed at 02/01/15 0900  Gross per 24 hour  Intake    240 ml  Output      0 ml  Net    240 ml   Exam: General: No acute respiratory distress - alert  HEENt: TLC in neck Lungs: Clear to auscultation bilaterally without wheeze  Cardiovascular: Regular rate and rhythm  without murmur  Abdomen: Large midline wound dressed and dry, faint bowel sounds, soft, no rebound  Extremities: No significant cyanosis or clubbing - 1+ B LE edema, HD cath in R groin  Data Reviewed: Basic Metabolic Panel:  Recent Labs Lab 01/28/15 0445 01/29/15 0505 01/30/15 0457 01/31/15 0529 02/01/15 0616  NA 133* 134* 136 138 138  K 3.8 4.3 4.3 3.8 4.1  CL 98* 98* 100* 99* 100*  CO2 26 24 24 29 26   GLUCOSE 103* 92 93 93 91  BUN 51* 58* 67* 39* 51*  CREATININE 11.42* 13.24* 14.94* 10.29* 11.59*  CALCIUM 7.8* 7.9* 8.1* 8.1* 8.1*  PHOS 5.0* 5.0* 6.4* 5.1* 5.7*     Liver Function Tests:  Recent Labs Lab 01/27/15 0525 01/28/15 0445 01/29/15 0505 01/30/15 0457 01/31/15 0529 02/01/15 0616  AST 50*  --   --   --   --   --   ALT 65*  --   --   --   --   --   ALKPHOS 145*  --   --   --   --   --   BILITOT 1.6*  --   --   --   --   --   PROT 5.3*  --   --   --   --   --   ALBUMIN 2.0*  2.1* 1.9* 1.9* 2.0* 2.0* 2.0*    CBC:  Recent Labs Lab 01/26/15 0333 01/27/15 0525 01/28/15 0445 01/29/15 0505  WBC 8.0 8.0 7.8 9.0  HGB 7.9* 8.3* 7.7* 7.8*  HCT 23.2* 24.7* 23.4* 23.5*  MCV 88.2 89.5 90.7 91.1  PLT 246 258 284 284    CBG: No results for input(s): GLUCAP in the last 168 hours.  Recent Results (from the past 240 hour(s))  Culture, blood (routine x 2)     Status: None   Collection Time: 01/22/15  3:25 PM  Result Value Ref Range Status   Specimen Description BLOOD LEFT ARM  Final   Special Requests BOTTLES DRAWN AEROBIC ONLY 5CC  Final   Culture NO GROWTH 5 DAYS  Final   Report Status 01/27/2015 FINAL  Final  Culture, blood (routine x 2)     Status: None   Collection Time: 01/22/15  3:30 PM  Result Value Ref Range Status   Specimen Description BLOOD RIGHT ARM  Final   Special Requests BOTTLES DRAWN AEROBIC ONLY 2CC  Final   Culture NO GROWTH 5 DAYS  Final   Report Status 01/27/2015 FINAL  Final  Culture, Urine     Status: None   Collection Time:  01/22/15  6:03 PM  Result Value Ref Range Status   Specimen Description URINE, CATHETERIZED  Final   Special Requests NONE  Final   Culture NO GROWTH 1 DAY  Final   Report Status 01/23/2015 FINAL  Final  Culture, blood (routine x 2)     Status: None (Preliminary result)   Collection Time: 01/29/15 10:45 PM  Result Value Ref Range Status   Specimen Description BLOOD RIGHT ANTECUBITAL  Final   Special Requests   Final  BOTTLES DRAWN AEROBIC AND ANAEROBIC 10CC EACH ANCEF   Culture NO GROWTH 2 DAYS  Final   Report Status PENDING  Incomplete  Culture, blood (routine x 2)     Status: None (Preliminary result)   Collection Time: 01/29/15 11:00 PM  Result Value Ref Range Status   Specimen Description BLOOD LEFT ANTECUBITAL  Final   Special Requests   Final    BOTTLES DRAWN AEROBIC AND ANAEROBIC 10CC EACH ANCEF   Culture NO GROWTH 2 DAYS  Final   Report Status PENDING  Incomplete       Scheduled Meds:  Scheduled Meds: . antiseptic oral rinse  7 mL Mouth Rinse QID  . aspirin  81 mg Oral Daily  . chlorhexidine gluconate  15 mL Mouth Rinse BID  . darbepoetin (ARANESP) injection - DIALYSIS  100 mcg Intravenous Q Mon-HD  . feeding supplement (NEPRO CARB STEADY)  237 mL Oral BID BM  . heparin subcutaneous  5,000 Units Subcutaneous 3 times per day  . multivitamin with minerals  1 tablet Oral Daily  . pantoprazole  40 mg Oral QHS  . sodium chloride  10 mL Intravenous Q12H    Time spent on care of this patient: 30 mins  Zannie Cove [phone] [facility] For Consults/Admissions - Flow Manager - [phone] Office  [phone]  Contact MD directly via text page:      [email or web address]      password [facility]  02/01/2015, 2:55 PM   LOS: 24 days

## 2015-02-01 NOTE — Progress Notes (Signed)
Patient refused dressing change x2 (@22 :00 &05:45am). Patient states he prefers dressing change later in the morning. Will update morning RN.

## 2015-02-01 NOTE — Progress Notes (Signed)
Patient ID: Jared [name], male   DOB: May 23, 1956, 59 y.o.   MRN: [id number]  S:  Now no UOP recorded ? He says he is going well.   BUN and creatinine up from yest   O:BP 162/65 mmHg  Pulse 70  Temp(Src) 97.8 F (36.6 C) (Oral)  Resp 20  Wt 126.916 kg (279 lb 12.8 oz)  SpO2 97%  Intake/Output Summary (Last 24 hours) at 02/01/15 1002 Last data filed at 02/01/15 0900  Gross per 24 hour  Intake    480 ml  Output      0 ml  Net    480 ml   Intake/Output: I/O last 3 completed shifts: In: 462 [P.O.:462] Out: 175 [Urine:175]  Intake/Output this shift:  Total I/O In: 240 [P.O.:240] Out: -  Weight change: 1.916 kg (4 lb 3.6 oz) Gen: alert Heart: RRR, no m/r/g Resp:bibasilar crackles.  Abd:+BS, binder in place, tender to palpation EXT: trace - 1+ edema Neuro: awake today,  Clearer by the day   Recent Labs Lab 01/26/15 0333 01/27/15 0525 01/28/15 0445 01/29/15 0505 01/30/15 0457 01/31/15 0529 02/01/15 0616  NA 134* 137 133* 134* 136 138 138  K 3.7 4.1 3.8 4.3 4.3 3.8 4.1  CL 97* 100* 98* 98* 100* 99* 100*  CO2 GLUCOSE 99 105* 103* 92 93 93 91  BUN 87* 39* 51* 58* 67* 39* 51*  CREATININE 14.55* 9.33* 11.42* 13.24* 14.94* 10.29* 11.59*  ALBUMIN 2.0* 2.0*  2.1* 1.9* 1.9* 2.0* 2.0* 2.0*  CALCIUM 8.1* 7.9* 7.8* 7.9* 8.1* 8.1* 8.1*  PHOS 7.3* 4.5 5.0* 5.0* 6.4* 5.1* 5.7*  AST  --  50*  --   --   --   --   --   ALT  --  65*  --   --   --   --   --    Liver Function Tests:  Recent Labs Lab 01/27/15 0525  01/30/15 0457 01/31/15 0529 02/01/15 0616  AST 50*  --   --   --   --   ALT 65*  --   --   --   --   ALKPHOS 145*  --   --   --   --   BILITOT 1.6*  --   --   --   --   PROT 5.3*  --   --   --   --   ALBUMIN 2.0*  2.1*  < > 2.0* 2.0* 2.0*  < > = values in this interval not displayed. No results for input(s): LIPASE, AMYLASE in the last 168 hours.  Recent Labs Lab 01/26/15 0333  AMMONIA 38*   CBC:  Recent Labs Lab 01/26/15 0333  01/27/15 0525 01/28/15 0445 01/29/15 0505  WBC 8.0 8.0 7.8 9.0  HGB 7.9* 8.3* 7.7* 7.8*  HCT 23.2* 24.7* 23.4* 23.5*  MCV 88.2 89.5 90.7 91.1  PLT 246 258 284 284   Cardiac Enzymes: No results for input(s): CKTOTAL, CKMB, CKMBINDEX, TROPONINI in the last 168 hours. CBG: No results for input(s): GLUCAP in the last 168 hours.  Iron Studies:  No results for input(s): IRON, TIBC, TRANSFERRIN, FERRITIN in the last 72 hours. Studies/Results: No results found. Marland Kitchen antiseptic oral rinse  7 mL Mouth Rinse QID  . aspirin  81 mg Oral Daily  . chlorhexidine gluconate  15 mL Mouth Rinse BID  . darbepoetin (ARANESP) injection - DIALYSIS  100 mcg Intravenous Q Mon-HD  . feeding  supplement (NEPRO CARB STEADY)  237 mL Oral BID BM  . heparin subcutaneous  5,000 Units Subcutaneous 3 times per day  . multivitamin with minerals  1 tablet Oral Daily  . pantoprazole  40 mg Oral QHS  . sodium chloride  10 mL Intravenous Q12H    BMET    Component Value Date/Time   NA 138 02/01/2015 0616   K 4.1 02/01/2015 0616   CL 100* 02/01/2015 0616   CO2 26 02/01/2015 0616   GLUCOSE 91 02/01/2015 0616   BUN 51* 02/01/2015 0616   CREATININE 11.59* 02/01/2015 0616   CREATININE 1.15 12/17/2014 1707   CALCIUM 8.1* 02/01/2015 0616   GFRNONAA 4* 02/01/2015 0616   GFRNONAA 68 12/17/2014 1707   GFRAA 5* 02/01/2015 0616   GFRAA 79 12/17/2014 1707   CBC    Component Value Date/Time   WBC 9.0 01/29/2015 0505   RBC 2.58* 01/29/2015 0505   RBC 2.63* 01/26/2015 0333   HGB 7.8* 01/29/2015 0505   HCT 23.5* 01/29/2015 0505   PLT 284 01/29/2015 0505   MCV 91.1 01/29/2015 0505   MCH 30.2 01/29/2015 0505   MCHC 33.2 01/29/2015 0505   RDW 15.6* 01/29/2015 0505   LYMPHSABS 1.0 01/21/2015 0312   MONOABS 1.1* 01/21/2015 0312   EOSABS 1.0* 01/21/2015 0312   BASOSABS 0.1 01/21/2015 0312     Assessment/Plan:  1. AoCKD,- now calling ESRD-  presumably due to septic shock related ATN- creatinine 1.15 on July  27 1. CVVHD from 8/20-  01/14/15 and transitioned to intermittent HD 01/16/15- done last 9/5 and 9/9 2.   temp HD cath removed --using PC placed 9/8 3. It seems he will clearly need more HD at this time - HD dependent since 8/20 now 3 weeks- s/p [facility]- Anticipate will need HD again on Monday but will get labs in AM.  CLIP to OP unit in process-  Hold off on perm access given normal renal function in late July and good UOP hopefully signifying reasonable chance for renal recovery. Hopeful for discharge early next week once OP spot arranged  2. SIRS- clinical PE (RA and RV dilated, RV hypocontractile on ECHO). plan was long term anticoagulation - vq low prob so heparin stopped 3. CAD- cath revealed occluded OM, cardiology following 4. Shock liver- improved.  5. S/p exploratory lap and fascia closure now s/p wound vac - with some bleeding, improved after holding anticoag, now back on anticoag.  6. Metabolic acidosis- resolved 7. Hyponatremia. - resolved 8. Thrombocytopenia- hit panel neg. Likely 2/2 to shocked liver. Now improved. Back on heparin - no bleeding currently.  9. Hypoalbuminemia- per primary svc 10. Nutrition: off TPN, taking some PO 11. Persistent Fevers: 8/30: temp HD cath removed.  Started Vanc.  B Cx x2 NGTD- resolved  12. Anemia- have added ESA - ferr over 1000 no iron - given blood with HD 9/9 as well for hgb less than 8 13. Bones- phos is 5.1 no binder- PTH pending   Karon Cotterill A

## 2015-02-02 LAB — CBC
HCT: 29.2 % — ABNORMAL LOW (ref 39.0–52.0)
HEMOGLOBIN: 9.5 g/dL — AB (ref 13.0–17.0)
MCH: 29.1 pg (ref 26.0–34.0)
MCHC: 32.5 g/dL (ref 30.0–36.0)
MCV: 89.6 fL (ref 78.0–100.0)
Platelets: 275 10*3/uL (ref 150–400)
RBC: 3.26 MIL/uL — AB (ref 4.22–5.81)
RDW: 15.4 % (ref 11.5–15.5)
WBC: 7.4 10*3/uL (ref 4.0–10.5)

## 2015-02-02 LAB — RENAL FUNCTION PANEL
ANION GAP: 11 (ref 5–15)
Albumin: 2 g/dL — ABNORMAL LOW (ref 3.5–5.0)
BUN: 58 mg/dL — ABNORMAL HIGH (ref 6–20)
CALCIUM: 7.9 mg/dL — AB (ref 8.9–10.3)
CO2: 27 mmol/L (ref 22–32)
Chloride: 98 mmol/L — ABNORMAL LOW (ref 101–111)
Creatinine, Ser: 12.21 mg/dL — ABNORMAL HIGH (ref 0.61–1.24)
GFR calc non Af Amer: 4 mL/min — ABNORMAL LOW (ref >60)
GFR, EST AFRICAN AMERICAN: 4 mL/min — AB (ref >60)
Glucose, Bld: 96 mg/dL (ref 65–99)
Phosphorus: 6.1 mg/dL — ABNORMAL HIGH (ref 2.5–4.6)
Potassium: 3.9 mmol/L (ref 3.5–5.1)
SODIUM: 136 mmol/L (ref 135–145)

## 2015-02-02 LAB — PARATHYROID HORMONE, INTACT (NO CA): PTH: 83 pg/mL — ABNORMAL HIGH (ref 15–65)

## 2015-02-02 MED ORDER — SENNA 8.6 MG PO TABS: 2.0000 | ORAL_TABLET | Freq: Every day | ORAL | Status: DC

## 2015-02-02 MED ORDER — AMLODIPINE BESYLATE 5 MG PO TABS: 5.0000 mg | ORAL_TABLET | Freq: Every day | ORAL | Status: DC

## 2015-02-02 MED ORDER — OXYCODONE HCL 5 MG PO TABS: 5.0000 mg | ORAL_TABLET | ORAL | Status: DC | PRN

## 2015-02-02 MED ORDER — ASPIRIN 81 MG PO CHEW: 81.0000 mg | CHEWABLE_TABLET | Freq: Every day | ORAL | Status: DC

## 2015-02-02 MED ORDER — NEBIVOLOL HCL 5 MG PO TABS
5.0000 mg | ORAL_TABLET | Freq: Every day | ORAL | Status: DC
  Administered 2015-02-02: 5 mg via ORAL
  Filled 2015-02-02 (×3): qty 1

## 2015-02-02 MED ORDER — LANTHANUM CARBONATE 500 MG PO CHEW
500.0000 mg | CHEWABLE_TABLET | Freq: Three times a day (TID) | ORAL | Status: DC
  Administered 2015-02-02 – 2015-02-11 (×15): 500 mg via ORAL
  Filled 2015-02-02 (×31): qty 1

## 2015-02-02 MED ORDER — ALPRAZOLAM 1 MG PO TABS: 0.5000 mg | ORAL_TABLET | Freq: Three times a day (TID) | ORAL | Status: DC | PRN

## 2015-02-02 MED ORDER — NEBIVOLOL HCL 5 MG PO TABS: 5.0000 mg | ORAL_TABLET | Freq: Every day | ORAL | Status: DC

## 2015-02-02 MED ORDER — AMLODIPINE BESYLATE 5 MG PO TABS
5.0000 mg | ORAL_TABLET | Freq: Every day | ORAL | Status: DC
  Administered 2015-02-02: 5 mg via ORAL
  Filled 2015-02-02: qty 1

## 2015-02-02 NOTE — Progress Notes (Signed)
Mr and Mrs Kempner were visited today to discuss outpatient dialysis. 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 I reviewed the environment of the outpatient facility and the different team members that they would meet. They did not have any additional questions, my card has been provided for any questions,

## 2015-02-02 NOTE — Progress Notes (Signed)
Patient ID: Jared [name], male   DOB: 24-Jun-1955, 59 y.o.   MRN: [id number]  [facility] Progress Note    Assessment/ Plan:   1. Acute on chronic kidney disease stage III- now likely progressed to ESRD- presumably due to septic shock related ATN- (creatinine 1.15 on July 27)-previously on CRRT and then transitioned over to intermittent hemodialysis; he has been on renal replacement therapy for the last 3 weeks or so. He has good urine output but no evidence of renal recovery is yet-unclear whether this will follow. No acute hemodialysis needs at this time and will order this for tomorrow. Attempting outpatient dialysis unit spot at [facility] 2. SIRS- clinical PE (RA and RV dilated, RV hypocontractile on ECHO) for which she was transiently on anticoagulation - VQ low prob so heparin stopped 3. CAD- cath revealed occluded OM, cardiology following 4. S/p exploratory lap and fascia closure now s/p wound vac - with some bleeding, improved after holding anticoag, now back on anticoag.  5. Nutrition: off TPN, taking some PO, continue oral nutritional supplementation 6. 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, will start on Fosrenol 500 mg 3 times a day with meals- PTH pending  Subjective:   Reports to be feeling fair-anxious about renal recovery and slow healing of his abdominal wounds    Objective:   BP 163/76 mmHg  Pulse 70  Temp(Src) 98.3 F (36.8 C) (Oral)  Resp 18  Wt 126.871 kg (279 lb 11.2 oz)  SpO2 98%  Intake/Output Summary (Last 24 hours) at 02/02/15 1103 Last data filed at 02/02/15 0857  Gross per 24 hour  Intake    720 ml  Output    600 ml  Net    120 ml   Weight change: -0.045 kg (-1.6 oz)  Physical Exam: Gen: Comfortably sleeping in bed-easy to awaken engage in conversation CVS: Pulse regular in rate and rhythm, S1 and S2 normal Resp: Decreased breath sounds over bases  otherwise clear Abd: Soft, obese, abdominal binder in situ Ext: 1+ lower extremity edema  Imaging: No results found.  Labs: BMET  Recent Labs Lab 01/27/15 0525 01/28/15 0445 01/29/15 0505 01/30/15 0457 01/31/15 0529 02/01/15 0616 02/02/15 0406  NA 137 133* 134* 136 138 138 136  K 4.1 3.8 4.3 4.3 3.8 4.1 3.9  CL 100* 98* 98* 100* 99* 100* 98*  CO2 27 26 24 24 29 26 27   GLUCOSE 105* 103* 92 93 93 91 96  BUN 39* 51* 58* 67* 39* 51* 58*  CREATININE 9.33* 11.42* 13.24* 14.94* 10.29* 11.59* 12.21*  CALCIUM 7.9* 7.8* 7.9* 8.1* 8.1* 8.1* 7.9*  PHOS 4.5 5.0* 5.0* 6.4* 5.1* 5.7* 6.1*   CBC  Recent Labs Lab 01/27/15 0525 01/28/15 0445 01/29/15 0505 02/02/15 0406  WBC 8.0 7.8 9.0 7.4  HGB 8.3* 7.7* 7.8* 9.5*  HCT 24.7* 23.4* 23.5* 29.2*  MCV 89.5 90.7 91.1 89.6  PLT 258 284 284 275    Medications:    . antiseptic oral rinse  7 mL Mouth Rinse QID  . aspirin  81 mg Oral Daily  . chlorhexidine gluconate  15 mL Mouth Rinse BID  . darbepoetin (ARANESP) injection - DIALYSIS  100 mcg Intravenous Q Mon-HD  . feeding supplement (NEPRO CARB STEADY)  237 mL Oral BID BM  . heparin subcutaneous  5,000 Units Subcutaneous 3 times per day  . multivitamin with minerals  1 tablet Oral Daily  . pantoprazole  40 mg Oral QHS  . sodium chloride  10 mL Intravenous Q12H   Zetta Bills, MD 02/02/2015, 11:03 AM

## 2015-02-02 NOTE — Evaluation (Signed)
Physical Therapy Re-Evaluation Patient Details Name: Jared [name] MRN: [id number] DOB: [birth year] Today's Date: 02/02/2015   History of Present Illness  Pt is a 59 y/o male with a PMH of chronic back pain, chest pain, HTN, hyerlipidemia, obesity, sleep apnea, depression. Pt was found down at home by wife on 01/08/15. He was intubated due to acute respiratory failure. Pt had exploratory laparotomy, cardiac catheterization, and received dialysis treatment. Pt's primary issues are pulmonary embolism and difficulty with his abdominal wound. Pt's active problems are spetic shock, acute respiratory failure with hypoxia, acute respiratory failure with hypoxemia, cardigenic shock, acute kidney injury, and renal failure. Pt was extubated on 01/18/15 after a 10 day intubation period. Had been on bedrest due to temporary femoral HD catheter, with tunneled HD catheter placed on 9/8  Clinical Impression   Seen for Re-evaluation as he is off bedrest with temporary femoral HD catheter out; Able to ambulate hallways though unsteady; recommending RW for safety with amb at this time, and he may be able to progress to using a cane; he is impulsive and somewhat resistant to safety recommendations; Pt admitted with above. Pt currently with functional limitations due to the deficits listed below (see PT Problem List).  Pt will benefit from skilled PT to increase their independence and safety with mobility to allow discharge to the venue listed below.       Follow Up Recommendations Home health PT;Supervision for mobility/OOB  (not sure how open he will be to HHPT)    Equipment Recommendations  3in1 (PT)    Recommendations for Other Services OT consult     Precautions / Restrictions Precautions Precautions: Fall      Mobility  Bed Mobility Overal bed mobility: Needs Assistance Bed Mobility: Supine to Sit     Supine to sit: Min guard (without physical contact)     General bed mobility comments: Used  bedrails; Cues to initiate, but not needing physical assist to get up  Transfers Overall transfer level: Needs assistance Equipment used: None Transfers: Sit to/from Stand Sit to Stand: Min assist         General transfer comment: Stood impulsively, noted use of UEs to help with push off; minguard for safety; almost immediately sat back down to bed due to dizziness and posterior lean; Pt states he "got up too fast"  Ambulation/Gait Ambulation/Gait assistance: Min guard Ambulation Distance (Feet): 120 Feet Assistive device: Rolling walker (2 wheeled);None Gait Pattern/deviations: Step-through pattern (erratic step width, indicative of higher fall risk)     General Gait Details: Hesitant to use RW, then agreed with some provision of rationale for using it (larger BOS, can press down into it to take pressure off of painful back, decr risk of losing balance); Cues to keep RW close, and increased height of RW for more optimal fit; Walked back to room without RW, and pt required close guard due to unsteadiness with amb; erratic step width; left RW in room for him to use while here  Stairs            Wheelchair Mobility    Modified Rankin (Stroke Patients Only)       Balance             Standing balance-Leahy Scale: Fair                               Pertinent Vitals/Pain Pain Assessment: 0-10 Pain Score: 8  Pain Location: Back  pain Pain Descriptors / Indicators: Aching Pain Intervention(s): Monitored during session;Repositioned;Patient requesting pain meds-RN notified    Home Living Family/patient expects to be discharged to:: Private residence Living Arrangements: Spouse/significant other Available Help at Discharge: Available PRN/intermittently Type of Home: House Home Access: Stairs to enter Entrance Stairs-Rails: Doctor, general practice of Steps: 3 Home Layout: One level Home Equipment: Environmental consultant - 2 wheels Additional Comments: Mr.  Cordner is not very forthcoming with information in session today    Prior Function Level of Independence: Independent         Comments: reports he had used a RW in the past after back surgeries (not sure when this was, but he still has the RW)     Hand Dominance        Extremity/Trunk Assessment   Upper Extremity Assessment: Generalized weakness;Defer to OT evaluation (Will request OT evaluation)           Lower Extremity Assessment: Generalized weakness         Communication   Communication: No difficulties  Cognition Arousal/Alertness: Awake/alert Behavior During Therapy: Agitated;Flat affect;Impulsive Overall Cognitive Status: Impaired/Different from baseline Area of Impairment: Safety/judgement         Safety/Judgement: Decreased awareness of safety;Decreased awareness of deficits     General Comments: Mr. [place] needed encouragement to participate, he initially did not want to get up and walk, stating he was too tired; He did ultimately agree to get up, the fact that he needs to get up and walk to be able to get home was helpful in persuading him; He was somewhat resistant to suggestions for increasing activity and incresing safety with amb by using a RW    General Comments      Exercises        Assessment/Plan    PT Assessment Patient needs continued PT services  PT Diagnosis Difficulty walking;Generalized weakness   PT Problem List Decreased strength;Decreased range of motion;Decreased activity tolerance;Decreased balance;Decreased mobility;Decreased knowledge of use of DME;Decreased safety awareness;Decreased knowledge of precautions;Pain  PT Treatment Interventions DME instruction;Gait training;Functional mobility training;Stair training;Therapeutic activities;Therapeutic exercise;Neuromuscular re-education;Patient/family education   PT Goals (Current goals can be found in the Care Plan section) Acute Rehab PT Goals Patient Stated Goal: Get out of  here. PT Goal Formulation: With patient/family (Goals set 8/29 continue to be appropriate; adding stair goal) Time For Goal Achievement: 02/16/15 Potential to Achieve Goals: Fair    Frequency Min 3X/week   Barriers to discharge   Will need to find out how much assist wife can give pt    Co-evaluation               End of Session Equipment Utilized During Treatment: Gait belt Activity Tolerance: Patient tolerated treatment well Patient left: in bed;with call bell/phone within reach;with family/visitor present Nurse Communication: Mobility status         Time: 1610-9604 PT Time Calculation (min) (ACUTE ONLY): 20 min   Charges:   PT Evaluation $PT Re-evaluation: 1 Procedure     PT G Codes:        Olen Pel 02/02/2015, 12:35 PM  Van Clines, PT  Acute Rehabilitation Services Pager [phone] Office [phone]

## 2015-02-02 NOTE — Progress Notes (Addendum)
PCCM and [place] TEAM 1 TRANSFER Progress Note  Jared [name] ZOX:[id number] DOB: 1955-12-18 DOA: 01/08/2015 PCP: Nadean Corwin, MD  Admit HPI / Brief Narrative: 59 year old M Hx Depression, Anxiety, HTN, HLD, OSA, and Chronic Back Pain who presented 8/18 after being found down at home. Responsive to narcan via EMS. In ED was profoundly hypotensive with elevated WBC, refractory to volume. In ER noted to have lactate 8.5, hyperkalemia and started on pressors. PCCM admitted the pt.    Significant Events: 8/18 Admit - CCS, Cardiology consulted; brief PEA arrest; exploratory laparotomy with placement of wound vac; started heparin gtt for presumed PE 8/20 CRRT  8/21 OR for abd wound closure  8/22 Anuric. On CRRT. Off pressors. Improved Shock Liver. On fent gtt and heparing gtt. 40% fio2 8/23 ileus 8/23 Cardiac catheterization left circumflex, second obtuse marginal branch,Ost 2d Mrg-2d Mrg lesion or percent stenosed 8/25 HIT Ab screen normal - TNA initiated  8/26 Bleeding around abd wound - surface bled and IV heparin held  9/5 patient was transferred to telemetry floor , received HD on 9/5  HPI/Subjective: Sleepy this am, headache overnight given phenergan, toradol and benadryl Also got IV dilaudid earlier this am  Assessment/Plan:  Sepsis with presumed abdominal source > exp lap unrevealing -improved, source unclear  -Vanc stopped 8/23 - Zosyn stopped 8/26 - temp up to 101 8/30 - Vanc resumed 8/30 - HD catheter removed 8/30 - new temp cath placed 8/31 right femoral - WBC has normalized - the patient is afebrile  - follow up blood cultures negative 4 thus far - afebrile and WBC normalized now  Acute renal failure/ATN with metab acidosis and hyperkalemia in CKD  - ATN from sepsis, Renal following - CRRT started per renal 8/20 - stopped 01/15/15  - On HD since 01/16/15 - RF and pan autoimmune negative 01/14/15 - baseline creatinine 1.14 from 12/17/14 - per Nephrology, s/p  Tunneled HD cath 9/8  - HD dependent now per Renal, being clipped for Outpatient HD -urine output still good  RV failure -RA and RV dliated w/ R heart failure on TTE  -no CTA due to renal failure , venous duplex w/o evidence of DVT  -was on IV heparin, VQ scan with low probability of PE and hence stopped IV heparin  Anemia  -stable, now -due to acute illness/critical illness -1 unit PRBC transfused 9/9  CAD Cardiac cath noted an occluded OM - Cardiology has evaluated and plans no further cardiac workup  Acute abdominal pain s/p laparotomy -wound VAC now discontinued - continue with wound care per general surgery recommendations -healing well  Metabolic encephalopathy -Appears to be multifactorial  -resolved  OSA -CPAP per respiratory, though patient is frequently refusing  Anemia/thrombocytopenia - Most likely multifactorial to include acute illness, sepsis - HIT panel negative - platelet count is climbing  Shock liver -LFTs have nearly normalized  A- Fib -Remains in NSR - off amio since 01/11/15  Systolic CHF/RV dilation -volume management per HD   Pulmonary hypertension  HTN -BP on higher side, start Amlodipine  Hyperglycemia  -CBG currently normal   DVT prophylaxis: Heparin SQ  Code Status: FULL Family Communication: wife at bedside Disposition Plan: - home when clipping for HD completed  Consultants: PCCM Gen Surgery  [facility] Cardiology  Nephrology  Procedure/Significant Events: 8/18 Echo - EF 45 to 50%, mod RV dilation, mod/severe RV dysfx, PAS 47 mmHg 8/18 Renal u/s - b/l renal echogenicity  Objective: Blood pressure 163/76, pulse 70, temperature 98.3  F (36.8 C), temperature source Oral, resp. rate 18, weight 126.871 kg (279 lb 11.2 oz), SpO2 98 %.  Intake/Output Summary (Last 24 hours) at 02/02/15 1440 Last data filed at 02/02/15 1146  Gross per 24 hour  Intake    480 ml  Output    650 ml  Net   -170 ml   Exam: General: AAOx3 HEENt:  TLC in neck Lungs: Clear to auscultation bilaterally without wheeze  Cardiovascular: Regular rate and rhythm without murmur  Abdomen: Large midline wound, healing well with pink granulation tissue,  dressed and dry, + bowel sounds, soft, no rebound  Extremities: No significant cyanosis or clubbing - 1+ B LE edema, HD cath in R groin  Data Reviewed: Basic Metabolic Panel:  Recent Labs Lab 01/29/15 0505 01/30/15 0457 01/31/15 0529 02/01/15 0616 02/02/15 0406  NA 134* 136 138 138 136  K 4.3 4.3 3.8 4.1 3.9  CL 98* 100* 99* 100* 98*  CO2 24 24 29 26 27   GLUCOSE 92 93 93 91 96  BUN 58* 67* 39* 51* 58*  CREATININE 13.24* 14.94* 10.29* 11.59* 12.21*  CALCIUM 7.9* 8.1* 8.1* 8.1* 7.9*  PHOS 5.0* 6.4* 5.1* 5.7* 6.1*     Liver Function Tests:  Recent Labs Lab 01/27/15 0525  01/29/15 0505 01/30/15 0457 01/31/15 0529 02/01/15 0616 02/02/15 0406  AST 50*  --   --   --   --   --   --   ALT 65*  --   --   --   --   --   --   ALKPHOS 145*  --   --   --   --   --   --   BILITOT 1.6*  --   --   --   --   --   --   PROT 5.3*  --   --   --   --   --   --   ALBUMIN 2.0*  2.1*  < > 1.9* 2.0* 2.0* 2.0* 2.0*  < > = values in this interval not displayed.  CBC:  Recent Labs Lab 01/27/15 0525 01/28/15 0445 01/29/15 0505 02/02/15 0406  WBC 8.0 7.8 9.0 7.4  HGB 8.3* 7.7* 7.8* 9.5*  HCT 24.7* 23.4* 23.5* 29.2*  MCV 89.5 90.7 91.1 89.6  PLT 258 284 284 275    CBG: No results for input(s): GLUCAP in the last 168 hours.  Recent Results (from the past 240 hour(s))  Culture, blood (routine x 2)     Status: None (Preliminary result)   Collection Time: 01/29/15 10:45 PM  Result Value Ref Range Status   Specimen Description BLOOD RIGHT ANTECUBITAL  Final   Special Requests   Final    BOTTLES DRAWN AEROBIC AND ANAEROBIC 10CC POF ON ANCEF   Culture NO GROWTH 4 DAYS  Final   Report Status PENDING  Incomplete  Culture, blood (routine x 2)     Status: None (Preliminary result)    Collection Time: 01/29/15 11:00 PM  Result Value Ref Range Status   Specimen Description BLOOD LEFT ANTECUBITAL  Final   Special Requests   Final    BOTTLES DRAWN AEROBIC AND ANAEROBIC 10CC POF ON ANCEF   Culture NO GROWTH 4 DAYS  Final   Report Status PENDING  Incomplete       Scheduled Meds:  Scheduled Meds: . antiseptic oral rinse  7 mL Mouth Rinse QID  . aspirin  81 mg Oral Daily  . chlorhexidine gluconate  15 mL Mouth Rinse BID  . darbepoetin (ARANESP) injection - DIALYSIS  100 mcg Intravenous Q Mon-HD  . feeding supplement (NEPRO CARB STEADY)  237 mL Oral BID BM  . heparin subcutaneous  5,000 Units Subcutaneous 3 times per day  . lanthanum  500 mg Oral TID WC  . multivitamin with minerals  1 tablet Oral Daily  . pantoprazole  40 mg Oral QHS  . sodium chloride  10 mL Intravenous Q12H    Time spent on care of this patient: 30 mins  Zannie Cove [phone] [facility] For Consults/Admissions - Flow Manager - [phone] Office  [phone]  Contact MD directly via text page:      [email or web address]      password [facility]  02/02/2015, 2:40 PM   LOS: 25 days

## 2015-02-02 NOTE — Progress Notes (Signed)
Patient walked down the hallway of unit with assistance from nurse Tech.

## 2015-02-02 NOTE — Progress Notes (Signed)
Pt. Refused cpap. 

## 2015-02-02 NOTE — Care Management Important Message (Signed)
Important Message  Patient Details  Name: Jared [name] MRN: [id number] Date of Birth: Dec 18, 1955   Medicare Important Message Given:  Yes-second notification given    Bernadette Hoit 02/02/2015, 12:39 PM

## 2015-02-03 ENCOUNTER — Inpatient Hospital Stay (HOSPITAL_COMMUNITY): Payer: Medicare Other

## 2015-02-03 ENCOUNTER — Inpatient Hospital Stay (HOSPITAL_COMMUNITY): Payer: Medicare Other | Admitting: Anesthesiology

## 2015-02-03 ENCOUNTER — Encounter (HOSPITAL_COMMUNITY): Payer: Self-pay | Admitting: Neurology

## 2015-02-03 DIAGNOSIS — N185 Chronic kidney disease, stage 5: Secondary | ICD-10-CM

## 2015-02-03 DIAGNOSIS — D72829 Elevated white blood cell count, unspecified: Secondary | ICD-10-CM

## 2015-02-03 DIAGNOSIS — G40301 Generalized idiopathic epilepsy and epileptic syndromes, not intractable, with status epilepticus: Secondary | ICD-10-CM

## 2015-02-03 DIAGNOSIS — R569 Unspecified convulsions: Secondary | ICD-10-CM | POA: Insufficient documentation

## 2015-02-03 LAB — RENAL FUNCTION PANEL
ALBUMIN: 2.1 g/dL — AB (ref 3.5–5.0)
Anion gap: 10 (ref 5–15)
BUN: 62 mg/dL — AB (ref 6–20)
CHLORIDE: 102 mmol/L (ref 101–111)
CO2: 26 mmol/L (ref 22–32)
CREATININE: 12.45 mg/dL — AB (ref 0.61–1.24)
Calcium: 8.2 mg/dL — ABNORMAL LOW (ref 8.9–10.3)
GFR calc Af Amer: 4 mL/min — ABNORMAL LOW (ref >60)
GFR, EST NON AFRICAN AMERICAN: 4 mL/min — AB (ref >60)
GLUCOSE: 97 mg/dL (ref 65–99)
POTASSIUM: 4 mmol/L (ref 3.5–5.1)
Phosphorus: 6.2 mg/dL — ABNORMAL HIGH (ref 2.5–4.6)
Sodium: 138 mmol/L (ref 135–145)

## 2015-02-03 LAB — URINALYSIS, ROUTINE W REFLEX MICROSCOPIC
Bilirubin Urine: NEGATIVE
Glucose, UA: NEGATIVE mg/dL
KETONES UR: NEGATIVE mg/dL
NITRITE: NEGATIVE
PH: 6 (ref 5.0–8.0)
Protein, ur: 100 mg/dL — AB
Specific Gravity, Urine: 1.012 (ref 1.005–1.030)
UROBILINOGEN UA: 0.2 mg/dL (ref 0.0–1.0)

## 2015-02-03 LAB — COMPREHENSIVE METABOLIC PANEL
ALT: 12 U/L — ABNORMAL LOW (ref 17–63)
AST: 48 U/L — ABNORMAL HIGH (ref 15–41)
Albumin: 2.1 g/dL — ABNORMAL LOW (ref 3.5–5.0)
Alkaline Phosphatase: 108 U/L (ref 38–126)
Anion gap: 17 — ABNORMAL HIGH (ref 5–15)
BUN: 67 mg/dL — ABNORMAL HIGH (ref 6–20)
CO2: 19 mmol/L — ABNORMAL LOW (ref 22–32)
Calcium: 8.1 mg/dL — ABNORMAL LOW (ref 8.9–10.3)
Chloride: 101 mmol/L (ref 101–111)
Creatinine, Ser: 13.21 mg/dL — ABNORMAL HIGH (ref 0.61–1.24)
GFR calc Af Amer: 4 mL/min — ABNORMAL LOW (ref >60)
GFR calc non Af Amer: 3 mL/min — ABNORMAL LOW (ref >60)
Glucose, Bld: 162 mg/dL — ABNORMAL HIGH (ref 65–99)
Potassium: 4.6 mmol/L (ref 3.5–5.1)
Sodium: 137 mmol/L (ref 135–145)
Total Bilirubin: 1 mg/dL (ref 0.3–1.2)
Total Protein: 5.6 g/dL — ABNORMAL LOW (ref 6.5–8.1)

## 2015-02-03 LAB — CBC
HCT: 31.1 % — ABNORMAL LOW (ref 39.0–52.0)
HEMATOCRIT: 11.7 % — AB (ref 39.0–52.0)
HEMOGLOBIN: 3.5 g/dL — AB (ref 13.0–17.0)
HEMOGLOBIN: 10.4 g/dL — AB (ref 13.0–17.0)
MCH: 28.9 pg (ref 26.0–34.0)
MCH: 29.4 pg (ref 26.0–34.0)
MCHC: 29.9 g/dL — ABNORMAL LOW (ref 30.0–36.0)
MCHC: 33.4 g/dL (ref 30.0–36.0)
MCV: 96.7 fL (ref 78.0–100.0)
MCV: 87.9 fL (ref 78.0–100.0)
PLATELETS: 305 10*3/uL (ref 150–400)
Platelets: 112 10*3/uL — ABNORMAL LOW (ref 150–400)
RBC: 1.21 MIL/uL — ABNORMAL LOW (ref 4.22–5.81)
RBC: 3.54 MIL/uL — AB (ref 4.22–5.81)
RDW: 15.6 % — AB (ref 11.5–15.5)
RDW: 14.9 % (ref 11.5–15.5)
WBC: 7.2 10*3/uL (ref 4.0–10.5)
WBC: 20.9 10*3/uL — AB (ref 4.0–10.5)

## 2015-02-03 LAB — URINE MICROSCOPIC-ADD ON

## 2015-02-03 LAB — LACTIC ACID, PLASMA
LACTIC ACID, VENOUS: 1.4 mmol/L (ref 0.5–2.0)
Lactic Acid, Venous: 4.5 mmol/L (ref 0.5–2.0)

## 2015-02-03 LAB — CULTURE, BLOOD (ROUTINE X 2)
Culture: NO GROWTH
Culture: NO GROWTH

## 2015-02-03 LAB — GLUCOSE, CAPILLARY
GLUCOSE-CAPILLARY: 168 mg/dL — AB (ref 65–99)
Glucose-Capillary: 168 mg/dL — ABNORMAL HIGH (ref 65–99)
Glucose-Capillary: 107 mg/dL — ABNORMAL HIGH (ref 65–99)

## 2015-02-03 LAB — TROPONIN I
TROPONIN I: 1.99 ng/mL — AB (ref <0.031)
Troponin I: 1.12 ng/mL (ref <0.031)

## 2015-02-03 LAB — PROCALCITONIN: PROCALCITONIN: 11.75 ng/mL

## 2015-02-03 LAB — TRIGLYCERIDES: TRIGLYCERIDES: 63 mg/dL (ref <150)

## 2015-02-03 MED ORDER — FUROSEMIDE 10 MG/ML IJ SOLN
INTRAMUSCULAR | Status: AC
  Filled 2015-02-03: qty 4

## 2015-02-03 MED ORDER — MIDAZOLAM HCL 2 MG/2ML IJ SOLN
INTRAMUSCULAR | Status: AC
  Administered 2015-02-03: 2 mg via INTRAVENOUS
  Filled 2015-02-03: qty 2

## 2015-02-03 MED ORDER — SUCCINYLCHOLINE CHLORIDE 20 MG/ML IJ SOLN
INTRAMUSCULAR | Status: DC | PRN
  Administered 2015-02-03: 60 mg via INTRAVENOUS

## 2015-02-03 MED ORDER — KETOROLAC TROMETHAMINE 30 MG/ML IJ SOLN
30.0000 mg | Freq: Once | INTRAMUSCULAR | Status: AC
  Administered 2015-02-03: 30 mg via INTRAVENOUS
  Filled 2015-02-03: qty 1

## 2015-02-03 MED ORDER — MIDAZOLAM HCL 2 MG/2ML IJ SOLN
2.0000 mg | INTRAMUSCULAR | Status: DC | PRN
  Administered 2015-02-03: 2 mg via INTRAVENOUS

## 2015-02-03 MED ORDER — MIDAZOLAM HCL 2 MG/2ML IJ SOLN
2.0000 mg | Freq: Once | INTRAMUSCULAR | Status: AC
  Administered 2015-02-03: 2 mg via INTRAVENOUS

## 2015-02-03 MED ORDER — ASPIRIN 81 MG PO CHEW
81.0000 mg | CHEWABLE_TABLET | Freq: Every day | ORAL | Status: DC
  Administered 2015-02-04 – 2015-02-11 (×8): 81 mg
  Filled 2015-02-03 (×8): qty 1

## 2015-02-03 MED ORDER — FENTANYL CITRATE (PF) 100 MCG/2ML IJ SOLN: 100.0000 ug | INTRAMUSCULAR | Status: DC | PRN

## 2015-02-03 MED ORDER — HEPARIN SODIUM (PORCINE) 1000 UNIT/ML DIALYSIS: 3000.0000 [IU] | INTRAMUSCULAR | Status: DC | PRN

## 2015-02-03 MED ORDER — SODIUM CHLORIDE 0.9 % IV SOLN
500.0000 mg | Freq: Two times a day (BID) | INTRAVENOUS | Status: DC
  Filled 2015-02-03: qty 5

## 2015-02-03 MED ORDER — MIDAZOLAM HCL 2 MG/2ML IJ SOLN
2.0000 mg | INTRAMUSCULAR | Status: DC | PRN
  Administered 2015-02-05 – 2015-02-07 (×9): 2 mg via INTRAVENOUS
  Filled 2015-02-03 (×11): qty 2

## 2015-02-03 MED ORDER — SODIUM CHLORIDE 0.9 % IV SOLN
1000.0000 mg | Freq: Two times a day (BID) | INTRAVENOUS | Status: DC
  Administered 2015-02-03 – 2015-02-09 (×13): 1000 mg via INTRAVENOUS
  Filled 2015-02-03 (×15): qty 10

## 2015-02-03 MED ORDER — DIPHENHYDRAMINE HCL 50 MG/ML IJ SOLN
25.0000 mg | Freq: Once | INTRAMUSCULAR | Status: AC
  Administered 2015-02-03: 25 mg via INTRAVENOUS
  Filled 2015-02-03: qty 1

## 2015-02-03 MED ORDER — FENTANYL CITRATE (PF) 100 MCG/2ML IJ SOLN
100.0000 ug | INTRAMUSCULAR | Status: AC | PRN
  Administered 2015-02-05 (×3): 100 ug via INTRAVENOUS
  Filled 2015-02-03 (×3): qty 2

## 2015-02-03 MED ORDER — ETOMIDATE 2 MG/ML IV SOLN
INTRAVENOUS | Status: DC | PRN
  Administered 2015-02-03: 6 mg via INTRAVENOUS

## 2015-02-03 MED ORDER — HYDRALAZINE HCL 20 MG/ML IJ SOLN
10.0000 mg | INTRAMUSCULAR | Status: DC | PRN
  Administered 2015-02-07 – 2015-02-09 (×4): 10 mg via INTRAVENOUS
  Administered 2015-02-09 (×2): 20 mg via INTRAVENOUS
  Administered 2015-02-11: 10 mg via INTRAVENOUS
  Filled 2015-02-03 (×3): qty 1
  Filled 2015-02-03: qty 2
  Filled 2015-02-03 (×2): qty 1

## 2015-02-03 MED ORDER — MIDAZOLAM HCL 2 MG/2ML IJ SOLN
2.0000 mg | INTRAMUSCULAR | Status: AC | PRN
  Administered 2015-02-06 (×3): 2 mg via INTRAVENOUS
  Filled 2015-02-03: qty 2

## 2015-02-03 MED ORDER — PANTOPRAZOLE SODIUM 40 MG PO PACK
40.0000 mg | PACK | Freq: Every day | ORAL | Status: DC
  Administered 2015-02-03 – 2015-02-07 (×5): 40 mg
  Filled 2015-02-03 (×6): qty 20

## 2015-02-03 MED ORDER — MIDAZOLAM HCL 2 MG/2ML IJ SOLN
INTRAMUSCULAR | Status: AC
Start: 2015-02-03 — End: 2015-02-04
  Filled 2015-02-03: qty 2

## 2015-02-03 MED ORDER — AMLODIPINE BESYLATE 10 MG PO TABS: 10.0000 mg | ORAL_TABLET | Freq: Every day | ORAL | Status: DC

## 2015-02-03 MED ORDER — METOCLOPRAMIDE HCL 5 MG/ML IJ SOLN
10.0000 mg | Freq: Once | INTRAMUSCULAR | Status: AC
  Administered 2015-02-03: 10 mg via INTRAVENOUS
  Filled 2015-02-03: qty 2

## 2015-02-03 MED ORDER — SODIUM CHLORIDE 0.9 % IV SOLN
1000.0000 mg | Freq: Once | INTRAVENOUS | Status: AC
  Administered 2015-02-03: 1000 mg via INTRAVENOUS
  Filled 2015-02-03: qty 10

## 2015-02-03 MED ORDER — SODIUM CHLORIDE 0.9 % IV SOLN
200.0000 mg | INTRAVENOUS | Status: AC
  Administered 2015-02-03: 200 mg via INTRAVENOUS
  Filled 2015-02-03: qty 20

## 2015-02-03 MED ORDER — PROPOFOL 1000 MG/100ML IV EMUL
INTRAVENOUS | Status: AC
  Administered 2015-02-03: 15 ug/kg/min via INTRAVENOUS
  Filled 2015-02-03: qty 100

## 2015-02-03 MED ORDER — SODIUM CHLORIDE 0.9 % IV SOLN
100.0000 mg | Freq: Two times a day (BID) | INTRAVENOUS | Status: DC
  Administered 2015-02-03 – 2015-02-10 (×13): 100 mg via INTRAVENOUS
  Filled 2015-02-03 (×26): qty 10

## 2015-02-03 MED ORDER — NEBIVOLOL HCL 5 MG PO TABS: 5.0000 mg | ORAL_TABLET | Freq: Every day | ORAL | Status: DC

## 2015-02-03 MED ORDER — MIDAZOLAM HCL 2 MG/2ML IJ SOLN
2.0000 mg | Freq: Once | INTRAMUSCULAR | Status: AC
  Administered 2015-02-03: 2 mg via INTRAVENOUS
  Filled 2015-02-03: qty 2

## 2015-02-03 MED ORDER — PROPOFOL 1000 MG/100ML IV EMUL
5.0000 ug/kg/min | INTRAVENOUS | Status: DC
  Administered 2015-02-03: 15 ug/kg/min via INTRAVENOUS
  Administered 2015-02-03 (×2): 80 ug/kg/min via INTRAVENOUS
  Administered 2015-02-03: 75 ug/kg/min via INTRAVENOUS
  Administered 2015-02-04: 80 ug/kg/min via INTRAVENOUS
  Administered 2015-02-04: 50 ug/kg/min via INTRAVENOUS
  Administered 2015-02-04 (×3): 70 ug/kg/min via INTRAVENOUS
  Administered 2015-02-04 (×5): 80 ug/kg/min via INTRAVENOUS
  Administered 2015-02-04: 30 ug/kg/min via INTRAVENOUS
  Administered 2015-02-04 – 2015-02-05 (×2): 80 ug/kg/min via INTRAVENOUS
  Administered 2015-02-05: 60 ug/kg/min via INTRAVENOUS
  Administered 2015-02-05: 80 ug/kg/min via INTRAVENOUS
  Administered 2015-02-05 (×5): 60 ug/kg/min via INTRAVENOUS
  Administered 2015-02-05: 80 ug/kg/min via INTRAVENOUS
  Administered 2015-02-05 (×2): 60 ug/kg/min via INTRAVENOUS
  Administered 2015-02-05 – 2015-02-07 (×16): 80 ug/kg/min via INTRAVENOUS
  Administered 2015-02-07 (×3): 75 ug/kg/min via INTRAVENOUS
  Administered 2015-02-07: 80 ug/kg/min via INTRAVENOUS
  Administered 2015-02-07: 40 ug/kg/min via INTRAVENOUS
  Administered 2015-02-07: 80 ug/kg/min via INTRAVENOUS
  Administered 2015-02-07: 75 ug/kg/min via INTRAVENOUS
  Filled 2015-02-03 (×12): qty 100
  Filled 2015-02-03 (×2): qty 200
  Filled 2015-02-03 (×4): qty 100
  Filled 2015-02-03: qty 300
  Filled 2015-02-03 (×2): qty 100
  Filled 2015-02-03 (×2): qty 200
  Filled 2015-02-03: qty 100
  Filled 2015-02-03: qty 200
  Filled 2015-02-03: qty 100
  Filled 2015-02-03: qty 200
  Filled 2015-02-03: qty 100
  Filled 2015-02-03: qty 300
  Filled 2015-02-03: qty 100
  Filled 2015-02-03 (×2): qty 200
  Filled 2015-02-03: qty 100
  Filled 2015-02-03: qty 200
  Filled 2015-02-03 (×3): qty 100
  Filled 2015-02-03: qty 200
  Filled 2015-02-03: qty 100

## 2015-02-03 MED ORDER — FENTANYL CITRATE (PF) 100 MCG/2ML IJ SOLN
25.0000 ug | INTRAMUSCULAR | Status: DC | PRN
  Administered 2015-02-04 – 2015-02-06 (×8): 100 ug via INTRAVENOUS
  Filled 2015-02-03 (×8): qty 2

## 2015-02-03 MED ORDER — MIDAZOLAM HCL 2 MG/2ML IJ SOLN: 2.0000 mg | INTRAMUSCULAR | Status: DC | PRN

## 2015-02-03 NOTE — Progress Notes (Signed)
Called by RN, patient apparently just had a seizure While i arrived at bedside, he was less responsive and roncherous lung sounds throughout lung fields, asked RN to suction and lay patient to side, subsequently had another seizure and started frothing and we activated code blue -patient was bagged at bedside after suctioning, white frothy secretions -never lost pulse, remained in NSR with normal BP -Intubated by anesthesia was given meds -suspect seizure could be related to meds, CT head ordered -PCCM at bedside, was taken to ICU post intubation, case d/w PCCM team -updated wife  Zannie Cove, MD [phone]

## 2015-02-03 NOTE — Progress Notes (Signed)
OT Cancellation Note/ discharge due to rapid response called  Patient Details Name: Jared [name] MRN: [id number] DOB: 09-18-1955   Cancelled Treatment:    Reason Eval/Treat Not Completed: Patient not medically ready (rapid response code called in progress currently)  Please reorder OT as appropriate. OT to sign off due to medical decline.   Felecia Shelling   OTR/L Pager: [phone] Office: [phone] .  02/03/2015, 2:17 PM

## 2015-02-03 NOTE — Progress Notes (Signed)
Called to room by patient's wife. Wife stated patient was shaking. Entered patient room. Patient with tremor, responding verbally to questions.Vital signs taken. BP elevated 200's systolic. Suction set up. Patient then began violently shaking entire body,posturing and drooling clear,frothy sputum.Lasted approx. 1 minute.Dr.Joseph called, head CT ordered. Rapid response called and assessed patient rhonci heard to clavicle, felt patient respiratory status too compromised for transport. Non re-breather placed, telemetry placed. Pt with repeat episode of violent shaking,drooling of clear,frothy sputum.Patient turned to right side for duration of shaking episode.Respiratory Therapy arrived, patient with agonal breathing, Rapid Response initiated Code Blue. Code Team arrived, defib pads placed, patient intubated. Report given to 45M, patient transferred. Wife outside of room. Updated by Dr.Joseph.

## 2015-02-03 NOTE — Progress Notes (Signed)
EEG completed; results pending.    

## 2015-02-03 NOTE — Progress Notes (Signed)
Rt called to pt's room following call to Rapid Response RN for possible Bipap. Pt having audible crackles and rales. Pt orally suctioned then Pt began coughing. Pt appeared Blue in the face and arms. Code Blue called. RT began to manually ventilate pt. CRNA to bedside to intubate Pt. Pt intubated with 7.5 ett secured with white paper tape at 24 to the Right. Pt transported to 73M 14 from 6E 26. Pt ett secured with commercial tube holder at beside at 24 at lip. Critical ABG results given to P. Hoffman NP CCM at bedside. RT will continue to monitor.

## 2015-02-03 NOTE — Procedures (Signed)
Intubation Procedure Note MARTHA GUERRY [id number] 06-Jun-1955  Procedure: Intubation Indications: Respiratory insufficiency  Procedure Details Consent: Unable to obtain consent because of emergent medical necessity. Time Out: Verified patient identification, verified procedure, site/side was marked, verified correct patient position, special equipment/implants available, medications/allergies/relevent history reviewed, required imaging and test results available.  Performed  Maximum sterile technique was used including cap, gloves, gown, hand hygiene and mask.  Miller and 3    Evaluation Hemodynamic Status: BP stable throughout; O2 sats: stable throughout Patient's Current Condition: unstable Complications: No apparent complications Patient did tolerate procedure well. Chest X-ray ordered to verify placement.  CXR: pending.   Pt intubated by CRNA at bedside during Code Blue. Pt intubated using Miller # 3 with 7.5 ETT on 1st attempt. Ett secured at 24 at the lip with White paper tape. Pt had bilateral BS, Positive color change on etco2, ETCO2 plugged into Zoll reading at 51, CXR pending.  RT will continue to monitor.   Carolan Shiver 02/03/2015

## 2015-02-03 NOTE — Progress Notes (Signed)
Patient ID: NILE PRISK, male   DOB: March 23, 1956, 59 y.o.   MRN: [id number]  [facility] Progress Note    Assessment/ Plan:   1. 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 He has good urine output but no evidence of renal recovery is yet-unclear whether this will follow. Continue hemodialysis on a Tuesday/Thursday and Saturday schedule. Order vein mapping today and consult vascular surgery for placement of permanent dialysis access. The patient has refused dialysis earlier today and plans on undertaking it later in the day. 2. SIRS- clinical PE (RA and RV dilated, RV hypocontractile on ECHO) for which she was transiently on anticoagulation - VQ low prob so heparin stopped 3. CAD- cath revealed occluded OM, cardiology following 4. S/p exploratory lap and fascia closure now s/p wound vac - with some bleeding, improved after holding anticoag, now back on anticoag.  5. Nutrition: off TPN, taking some PO, continue oral nutritional supplementation 6. 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 Also expressed some concerns about his treatment so far.    Objective:   BP 165/75 mmHg  Pulse 67  Temp(Src) 98.9 F (37.2 C) (Oral)  Resp 18  Wt 126.69 kg (279 lb 4.8 oz)  SpO2 96%  Intake/Output Summary (Last 24 hours) at 02/03/15 1047 Last data filed at 02/03/15 4098  Gross per 24 hour  Intake    480 ml  Output   2000 ml  Net  -1520 ml   Weight change: -0.181 kg (-6.4 oz)  Physical Exam: Gen: Comfortably sleeping  in bed- arousable with difficulty CVS: Pulse regular in rate and rhythm, S1 and S2 normal Resp: Decreased breath sounds over bases otherwise clear Abd: Soft, obese, abdominal binder in situ Ext: Trace lower extremity edema  Imaging: No results found.  Labs: BMET  Recent Labs Lab 01/28/15 0445 01/29/15 0505 01/30/15 0457 01/31/15 0529 02/01/15 0616 02/02/15 0406 02/03/15 0448  NA 133* 134* 136 138 138 136 138  K 3.8 4.3 4.3 3.8 4.1 3.9 4.0  CL 98* 98* 100* 99* 100* 98* 102  CO2 GLUCOSE 103* 92 93 93 91 96 97  BUN 51* 58* 67* 39* 51* 58* 62*  CREATININE 11.42* 13.24* 14.94* 10.29* 11.59* 12.21* 12.45*  CALCIUM 7.8* 7.9* 8.1* 8.1* 8.1* 7.9* 8.2*  PHOS 5.0* 5.0* 6.4* 5.1* 5.7* 6.1* 6.2*   CBC  Recent Labs Lab 01/28/15 0445 01/29/15 0505 02/02/15 0406  WBC 7.8 9.0 7.4  HGB 7.7* 7.8* 9.5*  HCT 23.4* 23.5* 29.2*  MCV 90.7 91.1 89.6  PLT 284 284 275   Medications:    . [START ON 02/04/2015] amLODipine  10 mg Oral Daily  . antiseptic oral rinse  7 mL Mouth Rinse QID  . aspirin  81 mg Oral Daily  . chlorhexidine gluconate  15 mL Mouth Rinse BID  . darbepoetin (ARANESP) injection - DIALYSIS  100 mcg Intravenous Q Mon-HD  . feeding supplement (NEPRO CARB STEADY)  237 mL Oral BID BM  . heparin subcutaneous  5,000 Units Subcutaneous  3 times per day  . lanthanum  500 mg Oral TID WC  . multivitamin with minerals  1 tablet Oral Daily  . nebivolol  5 mg Oral Daily  . pantoprazole  40 mg Oral QHS  . sodium chloride  10 mL Intravenous Q12H   Zetta Bills, MD 02/03/2015, 10:47 AM

## 2015-02-03 NOTE — Progress Notes (Signed)
PULMONARY / CRITICAL CARE MEDICINE   Name: Jared [name] MRN: [id number] DOB: [birth year]    ADMISSION DATE:  01/08/2015 CONSULTATION DATE:  8/18  REFERRING MD :  EDP Dr Criss Alvine  CHIEF COMPLAINT:  Found down  BRIEF DESCRIPTION:   59 yo male had severe abdominal pain, and then found unresponsive at home.  In ER he was in shock, cyanotic, with lactic acidosis, and hyperkalemia. Initial suspicion was for PE, however VQ was low prob. Etiology for such acute deterioration remains unclear, but is presumed to be occult septic shock by exclusion of PE, MI, bowel ischemia. Has been recovering, however has required continued dialysis. 9/13 suffered respiratory arrest and seizures requiring intubation and ICU transfer. PCCM to see.   STUDIES:  8/18 Echo > EF 45 to 50%, mod RV dilation, mod/severe RV dysfx, PAS 47 mmHg 8/18 Renal u/s >> b/l renal echogenicity 8/23 LHC - left circumflex, second obtuse marginal branch 100% occlusion   SIGNIFICANT EVENTS: 8/18 Admit, CCS, cardiology consulted; brief PEA arrest; laparotomy with placement of wound vac; started heparin gtt for presumed PE 8/19 Renal consulted. Duplex LE negative for DVT 8/20 CRRT  8/21 OR for abd wound closure  8/21  To OR this am with abd wound closure , did well. 8/23  Ileus, vanc stopped 8/26  Tolerating intemittent HD - oliguric. Vanc Stopped 8/27  hypertensive - on hydralazine prn. Anuric. Being weaned off dilaudid.  8/28  Extubated  TPN.  9/5 to tele 9/13 resp arrest > intubated > to ICU   SUBJECTIVE/OVERNIGHT/INTERVAL HX 59 year old male with description as above. He was had been slowly recovering to the degree that he was transferred to telemetry floor 9/5. Since that time he had still been requiring HD, and has give little hope for renal recovery. He was doing well and even ambulating with his family on the unit. Most of his acute issues for his hospitalization had mostly resolved or were close to resolving. 9/12 he  developed headache and had gotten some phenergan, Toradol. and benadryl. Also got IV dilaudid a little later. 9/13 patient has drowsy, and family reports that he was complaining of nausea, headache, and face tingling. He then had a witnessed grand mal seizure. Rapid response was called and upon the RN's arrival he was seizing with dilated pupils and R lateral gaze preference. He was in acute respiratory distress with pale, diaphoretic skin. Breath sounds were coarse with frothy white secretions. He was hypertensive. He was given supplemental O2. He then had another seizure and bit tongue. He was given ativan 2mg , code blue was called. He never lost pulses, or relative hemodynamic stability.  And he was eventually intubated by CRNA. He was transferred to ICU. Once RSI meds wore off he began seizing again, which was refractory to an additional 8 mg of versed. He was started on propofol and clinical seizures seemingly have stopped. PCCM to assume care.   VITAL SIGNS: Temp:  [98.1 F (36.7 C)-99.8 F (37.7 C)] 98.9 F (37.2 C) (09/13 0525) Pulse Rate:  [67-80] 67 (09/13 0525) Resp:  [17-32] 32 (09/13 1343) BP: (148-231)/(72-151) 231/72 mmHg (09/13 1343) SpO2:  [92 %-99 %] 99 % (09/13 1345) FiO2 (%):  [100 %] 100 % (09/13 1415) Weight:  [126 kg (277 lb 12.5 oz)-126.69 kg (279 lb 4.8 oz)] 126 kg (277 lb 12.5 oz) (09/13 1415) HEMODYNAMICS:   VENTILATOR SETTINGS: Vent Mode:  [-] PRVC FiO2 (%):  [100 %] 100 % Set Rate:  [18 bmp-25 bmp]  25 bmp Vt Set:  [161 mL] 670 mL PEEP:  [5 cmH20] 5 cmH20 INTAKE / OUTPUT:  Intake/Output Summary (Last 24 hours) at 02/03/15 1437 Last data filed at 02/03/15 0634  Gross per 24 hour  Intake    480 ml  Output   1600 ml  Net  -1120 ml      I/O last 3 completed shifts: In: 720 [P.O.:720] Out: 2250 [Urine:2250]  PHYSICAL EXAMINATION: General: obese male in NAD on vent. Neuro: RASS -4. Sedated on propofol. Wife states that during seizures he was moving all.    HEENT: ETT, [place]/AT, no JVD noted. Cardiovascular: RRR, no MRG. Lungs: Coarse breath sounds. Abdomen: Obese, soft, surgical dressing in place.  Musculoskeletal: No acute deformity.  LABS:  PULMONARY No results for input(s): PHART, PCO2ART, PO2ART, HCO3, TCO2, O2SAT in the last 168 hours.  Invalid input(s): PCO2, PO2  CBC  Recent Labs Lab 01/28/15 0445 01/29/15 0505 02/02/15 0406  HGB 7.7* 7.8* 9.5*  HCT 23.4* 23.5* 29.2*  WBC 7.8 9.0 7.4  PLT 284 284 275   COAGULATION  Recent Labs Lab 01/29/15 0505  INR 1.31    CARDIAC  No results for input(s): TROPONINI in the last 168 hours. No results for input(s): PROBNP in the last 168 hours.  CHEMISTRY  Recent Labs Lab 01/30/15 0457 01/31/15 0529 02/01/15 0616 02/02/15 0406 02/03/15 0448  NA 136 138 138 136 138  K 4.3 3.8 4.1 3.9 4.0  CL 100* 99* 100* 98* 102  CO2 GLUCOSE 93 93 91 96 97  BUN 67* 39* 51* 58* 62*  CREATININE 14.94* 10.29* 11.59* 12.21* 12.45*  CALCIUM 8.1* 8.1* 8.1* 7.9* 8.2*  PHOS 6.4* 5.1* 5.7* 6.1* 6.2*   Estimated Creatinine Clearance: 8.8 mL/min (by C-G formula based on Cr of 12.45).  LIVER  Recent Labs Lab 01/29/15 0505 01/30/15 0457 01/31/15 0529 02/01/15 0616 02/02/15 0406 02/03/15 0448  ALBUMIN 1.9* 2.0* 2.0* 2.0* 2.0* 2.1*  INR 1.31  --   --   --   --   --    INFECTIOUS No results for input(s): LATICACIDVEN, PROCALCITON in the last 168 hours. ENDOCRINE CBG (last 3)   Recent Labs  02/03/15 1354  GLUCAP 168*   IMAGING x48h No results found.   ASSESSMENT / PLAN:  PULMONARY ETT 8/18 >8/28, 9/13 >>> A: Acute respiratory failure in setting seizure vs aspiration OSA ? PE essentailly ruled out with neg DVT and low prob VQ prior in admit  P:   Full vent support ABG CXR VAP bundle  CARDIOVASCULAR Rt femoral CVL 8/18 >>8/20 Lt femoral A line 8/18 >>8/28 RIJ HD 8/20 >? LIJ 8/20 >? RIJ perm cath >>>  A: Hypertenson Elevated lactic acid >  suspect due to siezure Shock > Resolved. Likely RV failure with possible PE.-although venous doppler neg for DVT  X Cath 8/06/25/14 OM branch occlusion per report and unlikely cause of admission RV failure AFRVR - resolved  P:  Telemetry monitoring Holding PO amlodipine, hydralazine, nebivolol Trend lactic Trend troponin Scheduled IV metoprolol  RENAL A: ESRD new onset . Was on CRRT now intermittent HD dependent, - RF and pan autoimmune negative 01/14/15  P:   Nephrology following Follow Bmet Replace electrolytes as indicated.   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 F/u CBC and INR   INFECTIOUS Culture - negative HIV negative 01/14/15 A: Sepsis with presumed abdominal source.>exp lap was neg .   P:   Day 7 vanc completed 01/14/15 Day 10  Zosyn to be stopped 01/17/15  ENDOCRINE A: Hyperglycemia   P:   CBG and SSI  NEUROLOGIC A: Status epilepticus Acute encephalopathy. Metabolic + seizure component Hx  chronic back pain, anxiety, depression.  P:   RASS goal: -1 Propofol gtt for sedation and seizure control. Neurology consulted EEG MRI brain per neuro CT head Holding outpt xanax, celexa, oxycodone, lyrica  Updates: wife, son, daughter updated by myself and Dr. Jamison Neighbor.   Joneen Roach, AGACNP-BC [facility] Pulmonology/Critical Care Pager [phone] or [phone]  02/03/2015 4:02 PM

## 2015-02-03 NOTE — Consult Note (Signed)
[facility] Consult   Reason for consult: permanent dialysis access Consulting physician : Dr. Allena Katz (nephrology)  History of Present Illness  Jared [name] is a 59 y.o. (August 27, 1955) male who presents for evaluation for permanent access.  The patient is left hand dominant.  The patient is currently dialyzing via a right IJ tunneled dialysis catheter. He does not have a pacemaker.   The patient presented to the hospital on 01/08/15 after being found unresponsive at home. He was responsive to narcan via EMS. Upon presentation to the ED, he was found be profoundly hypotensive with leukocytosis, an elevated lactate of 8.5 and hyperkalemia. He was started on pressors. He reported severe abdominal pain that morning. General surgery was consulted and the patient was to proceed with emergent exploratory laparotomy. He suffered a brief PEA arrest prior to surgery.  Surgery revealed no ischemic bowel, perforation or obvious source of shock. He was started on heparin for presumed PE. He was started on CRRT on 01/10/15. He underwent cardiac catheterization on 01/13/15 that revealed an occluded OM. He had some bleeding around his abdominal wound and heparin was held. His HIT panel was negative. He was transferred to the telemetry floor on 01/26/15.   He currently endorses nausea, vomiting and some shortness of breath. He denies any chest pain. He denied dialysis this morning and will undergo dialysis later today.   Past Medical History  Diagnosis Date  . Chest pain   . HTN (hypertension)   . Hyperlipemia   . DJD (degenerative joint disease)   . BPH (benign prostatic hypertrophy)   . Obesity   . OSA (obstructive sleep apnea)   . Hypercholesteremia   . Chronic back pain   . Anxiety   . Depression     Past Surgical History  Procedure Laterality Date  . Back surgery    . Prostatectomy    . Cervical spine surgery    . Laparotomy N/A 01/08/2015    Procedure: EXPLORATORY  LAPAROTOMY;  Surgeon: Manus Rudd, MD;  Location: [facility] OR;  Service: General;  Laterality: N/A;  . Application of wound vac N/A 01/08/2015    Procedure: APPLICATION OF WOUND VAC;  Surgeon: Manus Rudd, MD;  Location: [facility] OR;  Service: General;  Laterality: N/A;  . Wound debridement N/A 01/11/2015    Procedure: DEBRIDEMENT WOUND AND VAC DRESSING CHANGE;  Surgeon: Manus Rudd, MD;  Location: [facility] OR;  Service: General;  Laterality: N/A;  . Cardiac catheterization N/A 01/13/2015    Procedure: Left Heart Cath and Coronary Angiography;  Surgeon: Corky Crafts, MD;  Location: [facility] INVASIVE CV LAB;  Service: Cardiovascular;  Laterality: N/A;    Social History   Social History  . Marital Status: Married    Spouse Name: N/A  . Number of Children: N/A  . Years of Education: N/A   Occupational History  . Not on file.   Social History Main Topics  . Smoking status: Former Games developer  . Smokeless tobacco: Not on file  . Alcohol Use: No     Comment: rarely  . Drug Use: No  . Sexual Activity: Not on file   Other Topics Concern  . Not on file   Social History Narrative    History reviewed. No pertinent family history.   No current facility-administered medications on file prior to encounter.   Current Outpatient Prescriptions on File Prior to Encounter  Medication Sig Dispense Refill  . albuterol (PROVENTIL HFA;VENTOLIN HFA) 108 (90 BASE) MCG/ACT inhaler Inhale 2  puffs into the lungs every 2 (two) hours as needed for wheezing or shortness of breath (cough). 1 Inhaler 2  . atorvastatin (LIPITOR) 40 MG tablet TAKE 1 TABLET BY MOUTH DAILY OR AS DIRECTED FOR CHOLESTEROL (Patient taking differently: TAKE 1 TABLET BY MOUTH every evening OR AS DIRECTED FOR CHOLESTEROL) 90 tablet 3  . bisoprolol-hydrochlorothiazide (ZIAC) 10-6.25 MG per tablet Take 1 tablet by mouth daily.    . Cholecalciferol (VITAMIN D-3) 5000 UNITS TABS Take 2,000 Units by mouth daily.     . citalopram (CELEXA) 40 MG tablet  TAKE 1 TABLET BY MOUTH EVERY DAY FOR MOOD 90 tablet 0  . docusate sodium (COLACE) 100 MG capsule Take 100 mg by mouth daily as needed for mild constipation.     . Flaxseed, Linseed, (FLAXSEED OIL PO) Take 1 tablet by mouth daily.      . fluticasone (FLONASE) 50 MCG/ACT nasal spray USE 1 TO 2 SPRAYS IN EACHNOSTRIL ONCE DAILY (Patient taking differently: USE 1 TO 2 SPRAYS IN EACHNOSTRIL ONCE DAILY as needed) 16 g 99  . losartan-hydrochlorothiazide (HYZAAR) 100-25 MG per tablet TAKE 1 TABLET BY MOUTH EVERY DAY 90 tablet 0  . minoxidil (LONITEN) 10 MG tablet Take 1 tablet daily or as directed for BP (Patient taking differently: Take 5 mg by mouth at bedtime. ) 90 tablet 99  . Omega-3 Fatty Acids (FISH OIL BURP-LESS PO) Take 1 tablet by mouth daily.      . pregabalin (LYRICA) 75 MG capsule Take 1 to 2 capsules 1 hour before sleep 14 capsule 0  . azelastine (ASTELIN) 0.1 % nasal spray 1 to 2 sprays each nostril every 12 hours (Patient taking differently: Place 1-2 sprays into both nostrils 2 (two) times daily as needed for allergies. 1 to 2 sprays each nostril every 12 hours) 30 mL 99  . ondansetron (ZOFRAN) 8 MG tablet 1/2 to 1 tablet up to 3 x daily as needed for nausea. 30 tablet 3  . testosterone cypionate (DEPOTESTOTERONE CYPIONATE) 200 MG/ML injection Inject 2 cc IM every 2 weeks or as directed by a physician. (Patient taking differently: Inject into the muscle every 28 (twenty-eight) days. Inject 2 cc IM every monthly or as directed by a physician.) 10 mL 2    Allergies  Allergen Reactions  . Morphine And Related Anaphylaxis and Shortness Of Breath  . Montelukast Other (See Comments)    unknown  . Neurontin [Gabapentin] Other (See Comments)    delusional  . Wellbutrin [Bupropion] Palpitations    Insomnia    REVIEW OF SYSTEMS:  (Positives checked otherwise negative)  CARDIOVASCULAR:  []  chest pain, []  chest pressure, []  palpitations, [x]  shortness of breath when laying flat, []  shortness of  breath with exertion,  []  pain in feet when walking, []  pain in feet when laying flat, []  history of blood clot in veins (DVT), []  history of phlebitis, []  swelling in legs, []  varicose veins  PULMONARY:  []  productive cough, []  asthma, []  wheezing  NEUROLOGIC:  [x]  weakness in arms or legs, []  numbness in arms or legs, []  difficulty speaking or slurred speech, []  temporary loss of vision in one eye, []  dizziness  HEMATOLOGIC:  []  bleeding problems, []  problems with blood clotting too easily  MUSCULOSKEL:  []  joint pain, []  joint swelling  GASTROINTEST:  []  vomiting blood, []  blood in stool     GENITOURINARY:  []  burning with urination, []  blood in urine  PSYCHIATRIC:  []  history of major depression  INTEGUMENTARY:  []   rashes, []  ulcers  CONSTITUTIONAL:  []  fever, []  chills  Physical Examination  Filed Vitals:   02/02/15 1605 02/02/15 2042 02/03/15 0525 02/03/15 1326  BP: 163/80 175/77 165/75 200/151  Pulse: 73 80 67   Temp: 98.1 F (36.7 C) 99.8 F (37.7 C) 98.9 F (37.2 C)   TempSrc: Oral Oral Oral   Resp: 18 17 18    Weight:  279 lb 4.8 oz (126.69 kg)    SpO2: 98% 96% 96%    Body mass index is 35.39 kg/(m^2).  General: A&O x 3 in NAD  Neck: Supple  Pulmonary: Sym exp, good air movt, course breath sounds anterior  Cardiac: RRR, Nl S1, S2, no Murmurs, rubs or gallops  Vascular: palpable 2+ radial pulses and dorsalis pedis pulses b/l, mild swelling of upper and lower extremities bilaterally   Gastrointestinal: Midline wound dressing clean   Neurologic:  Left upper extremity tremor   Psychiatric: Judgment intact, Mood & affect appropriate for pt's clinical situation  Dermatologic: See M/S exam for extremity exam, no rashes otherwise noted  Non-Invasive Vascular Imaging  Vein Mapping   pending  Medical Decision Making  Jared [name] is a 59 y.o. male who presents with new ESRD requiring hemodialysis. The patient is left handed. He is currently dialyzing  through a right IJ tunneled dialysis catheter.   Prior to completion of this note, a code blue was called. Will need to await medical stability prior to proceeding with surgery.   Vein mapping pending.   Dr. Myra Gianotti to see patient.   Maris Berger, PA-C [facility] Office: [phone] Pager: [phone]  02/03/2015, 1:28 PM

## 2015-02-03 NOTE — Procedures (Addendum)
VIDEO-EEG (24 hour) REPORT   PATIENT: Jared [name] MRN:  [id number] DOS:   02/04/2015-02/05/2015   BRIEF HISTORY: The patient is a 59 year old male to the hospital in sepsis, had a major convulsion, he was subsequently intubated and sedated. EEG revealed right lateralized periodic epileptiform discharges and he was placed on continuous video-EEG monitoring which subsequently revealed intermittent electrographic seizures arising from the right temporal region, c/w partial NCSE.  MEDICATIONS: Include Keppra and propofol  TECHNICAL DESCRIPTION: The recording consists of a continuous bedside video-EEG recording with 21 electrodes placed according to the International 10-20 system. Additional leads not specified but include one channel for EKG recording.  ELECTROGRAPHIC DESCRIPTION: The background is characterized by persistent slowing, consisting of low voltage polymorphic delta/theta with superimposed low voltage beta and recurrent spindle-like activity over vertex. In addition, there are periodic epileptiform discharges, with a sharp-slow-wave morphology, noted over the right hemisphere, maximal over the temporal chain with the sharp wave typically phase-reversing over F8. These very in frequency from 0.5-1 Hz.   In the first 8 hrs of the recording, up until about 4 PM (9/14), these periodic discharges frequently give way to an evolving rhythmic activity lasting 10-30 seconds, maximal over the right temporal chain, often concluding in a short 5-10 second run of right temporal periodic sharp wave complexes, consistent with brief electrographic seizures.   In the second 8 hrs of the recording, up to about midnight, there remain intermittent runs of rhythmic right temporal slowing as well as frequent short runs of periodic right temporal discharges, but these runs do not generally evolve into electrographic seizures.   In the last 8 hrs, the runs of rhythmic slowing become much less frequent and the  right temporal discharges become less periodic and more sporadic; the asymmetric slowing becomes less pronounced as well.   INTERPRETATION: This is an abnormal continuous video-EEG recording. Findings include: 1.  Right temporal seizures, non-convulsive, resolved over the course of the recording 2.  Right frontotemporal epileptiform discharges, often periodic in the initial portion of the recording, but largely isolated discharges by the end of the recording 3.  Diffuse slowing, polymorphic delta/theta, starting to resolved by the end of the recording 4.  Persistent asymmetry, with greater slowing on the right, but also resolving over the course of the recording  CLINICAL CORRELATION: The findings indicate a severe diffuse encephalopathy with focal features suggesting greater dysfunction over the right frontotemporal regions. In addition, the initial recording confirmed ongoing  partial non-convulsive seizures (NCSE) arising from the right frontotemporal region. By the end of the recording, the electrographic seizures had resolved; and the right temporal sharp waves, though still frequent, were generally sporadic and isolated. The background remained diffusely slow, but the asymmetry between the two sides was rapidly lessening. This suggests that much of the asymmetric slowing was the result of intermittent seizure activity and not primarily due to an underlying structural pathology. Of course, acute focal neuronal injury is certainly a plausible explanation for the flurry of seizure activity.   INTERPRETING MD:  Ellen Henri, MD [facility] - Neurology, [facility])   --------------------------------------------------------  VIDEO-EEG (24 hour) REPORT   PATIENT:  Jared [name] MRN:   [id number] DOS:   02/03/2015-02/04/2015   BRIEF HISTORY: The patient is a 59 year old male to the hospital in sepsis, had a major convulsion, he was subsequently intubated and sedated.  MEDICATIONS:  Include Keppra and propofol  TECHNICAL DESCRIPTION: The recording consists of a continuous bedside video-EEG recording with 21 electrodes placed according  to the International 10-20 system. Additional leads not specified but include one channel for EKG recording.  ELECTROGRAPHIC DESCRIPTION: The background is characterized by persistent slowing, consisting of low voltage polymorphic delta/theta with superimposed low voltage beta and recurrent spindle-like activity over vertex. In addition, there are periodic epileptiform discharges, with a sharp-slow-wave morphology, noted over the right hemisphere, maximal over the temporal chain with the sharp wave typically phase-reversing over F8. These very in frequency from 0.5-1 Hz. These periodic discharges frequently give way to an evolving rhythmic activity lasting 20-30 seconds, also maximal over the right temporal chain, consistent with brief electrographic seizures. These are very frequent in the first half of the recording, but become less frequent in the second half and are largely absent from the last quarter of the recording (from about 3-4 AM onward).   INTERPRETATION: this is an abnormal continuous video-EEG recording. Findings include: 1.  Right lateralized (frontotemporal) periodic epileptiform discharges, frequently evolving into electrographic seizures initially 2.  Diffuse slowing, polymorphic delta/theta with superimposed spindles over the vertex 3.  Diffuse beta 4.  Persistent asymmetry with higher amplitude delta note over the right hemisphere, esp. the right temporal region  CLINICAL CORRELATION: The findings indicate a severe diffuse encephalopathy with focal features suggesting greater dysfunction over the right frontotemporal regions. In addition, the findings document  partial non-convulsive epileptic status arising from the right frontotemporal region. The periodic discharges could reflect an end-state of partial status epilepticus or  they could reflect acute focal neuronal injury. Clinical correlation advised.   INTERPRETING MD:  Ellen Henri, MD [facility] - Neurology, [facility])  ------------------ EEG report.  Brief clinical history: [name] presenting to hospital with sepsis and no source Identified. Today patient was noted to have a TC seizure and not returning to baseline. After receiving 10 mg Versed, 1 gram Keppra the Propofol was increased to point no clinical seizure noted. Currently he is on Keppra 1 Gram BID  Technique: this is a 17 channel routine scalp EEG performed at the bedside in the ICU, with bipolar and monopolar montages arranged in accordance to the international 10/20 system of electrode placement. One channel was dedicated to EKG recording.  The patient is intubated on the vent. No activating procedures performed.  Description: as the study begins and throughout the entire recording, there is a diffuse and continuous admixture of theta and alpha activity, with the faster frequencies seen over the right hemisphere. Notably, there is evidence of rhythmic trains of repetitive sharp waves at 1-2 Hz  emanating from the C4, P4 electrodes and lasting up to 2 or 3 seconds that at times are follow by periods of suppression x 1 second. For the most part this activity remains confined to the C4,P4 electrodes although at times can not exclude propagation to the T4, T6 electrodes.   EKG showed sinus rhythm.  Impression: this is an abnormal EEG because of the aforementioned findings. 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 Nonetheless, I can not exclude partial status originating from the right central region  Clinical correlation is advised.   Wyatt Portela, MD

## 2015-02-03 NOTE — Consult Note (Signed)
NEURO HOSPITALIST CONSULT NOTE   Referring physician: PCCM   Reason for Consult: Seizure  HPI:                                                                                                                                          Jared [name] is an 59 y.o. male who presented to the hospital on 01/08/15 after being found unresponsive at home. He was responsive to narcan via EMS. Upon presentation to the ED, he was found be profoundly hypotensive with leukocytosis, an elevated lactate of 8.5 and hyperkalemia. He was started on pressors. He reported severe abdominal pain that morning. General surgery was consulted and the patient was to proceed with emergent exploratory laparotomy. He suffered a brief PEA arrest prior to surgery. Surgery revealed no ischemic bowel, perforation or obvious source of shock. He was started on heparin for presumed PE. He was started on CRRT on 01/10/15. He underwent cardiac catheterization on 01/13/15 that revealed an occluded OM. He had some bleeding around his abdominal wound and heparin was held. He was transferred to the telemetry floor on 01/26/15. Patietn this AM was noted to have a TC seizure and then has not returned to baseline.  Due to concern of NCSE neurology was called.  Currently he intubated, received 10 mg Versed, 1000 mg Keppra bolus and is on Propofol.  PCCM increased propofol until no clinical seizure activity was seen.   Per wife he had noted he had decreased sensation on the right prior to event.   Past Medical History  Diagnosis Date  . Chest pain   . HTN (hypertension)   . Hyperlipemia   . DJD (degenerative joint disease)   . BPH (benign prostatic hypertrophy)   . Obesity   . OSA (obstructive sleep apnea)   . Hypercholesteremia   . Chronic back pain   . Anxiety   . Depression     Past Surgical History  Procedure Laterality Date  . Back surgery    . Prostatectomy    . Cervical spine surgery    . Laparotomy N/A  01/08/2015    Procedure: EXPLORATORY LAPAROTOMY;  Surgeon: Manus Rudd, MD;  Location: [facility] OR;  Service: General;  Laterality: N/A;  . Application of wound vac N/A 01/08/2015    Procedure: APPLICATION OF WOUND VAC;  Surgeon: Manus Rudd, MD;  Location: [facility] OR;  Service: General;  Laterality: N/A;  . Wound debridement N/A 01/11/2015    Procedure: DEBRIDEMENT WOUND AND VAC DRESSING CHANGE;  Surgeon: Manus Rudd, MD;  Location: [facility] OR;  Service: General;  Laterality: N/A;  . Cardiac catheterization N/A 01/13/2015    Procedure: Left Heart Cath and Coronary Angiography;  Surgeon: Corky Crafts, MD;  Location: [facility] INVASIVE CV LAB;  Service: Cardiovascular;  Laterality:  N/A;    Family History  Problem Relation Age of Onset  . Hypertension Mother   . Hypertension Father      Social History:  reports that he has quit smoking. He does not have any smokeless tobacco history on file. He reports that he does not drink alcohol or use illicit drugs.  Allergies  Allergen Reactions  . Morphine And Related Anaphylaxis and Shortness Of Breath  . Montelukast Other (See Comments)    unknown  . Neurontin [Gabapentin] Other (See Comments)    delusional  . Wellbutrin [Bupropion] Palpitations    Insomnia    MEDICATIONS:                                                                                                                     Prior to Admission:  Prescriptions prior to admission  Medication Sig Dispense Refill Last Dose  . albuterol (PROVENTIL HFA;VENTOLIN HFA) 108 (90 BASE) MCG/ACT inhaler Inhale 2 puffs into the lungs every 2 (two) hours as needed for wheezing or shortness of breath (cough). 1 Inhaler 2 2 weeks  . atorvastatin (LIPITOR) 40 MG tablet TAKE 1 TABLET BY MOUTH DAILY OR AS DIRECTED FOR CHOLESTEROL (Patient taking differently: TAKE 1 TABLET BY MOUTH every evening OR AS DIRECTED FOR CHOLESTEROL) 90 tablet 3 01/07/2015 at Unknown time  . bisoprolol-hydrochlorothiazide (ZIAC) 10-6.25  MG per tablet Take 1 tablet by mouth daily.   01/07/2015 at Unknown time  . Cholecalciferol (VITAMIN D-3) 5000 UNITS TABS Take 2,000 Units by mouth daily.    01/07/2015 at Unknown time  . citalopram (CELEXA) 40 MG tablet TAKE 1 TABLET BY MOUTH EVERY DAY FOR MOOD 90 tablet 0 01/07/2015 at Unknown time  . docusate sodium (COLACE) 100 MG capsule Take 100 mg by mouth daily as needed for mild constipation.    01/05/2015 at Unknown time  . Flaxseed, Linseed, (FLAXSEED OIL PO) Take 1 tablet by mouth daily.     01/07/2015 at Unknown time  . fluticasone (FLONASE) 50 MCG/ACT nasal spray USE 1 TO 2 SPRAYS IN EACHNOSTRIL ONCE DAILY (Patient taking differently: USE 1 TO 2 SPRAYS IN EACHNOSTRIL ONCE DAILY as needed) 16 g 99 Past Month at Unknown time  . losartan-hydrochlorothiazide (HYZAAR) 100-25 MG per tablet TAKE 1 TABLET BY MOUTH EVERY DAY 90 tablet 0 01/07/2015 at Unknown time  . minoxidil (LONITEN) 10 MG tablet Take 1 tablet daily or as directed for BP (Patient taking differently: Take 5 mg by mouth at bedtime. ) 90 tablet 99 01/07/2015 at Unknown time  . Omega-3 Fatty Acids (FISH OIL BURP-LESS PO) Take 1 tablet by mouth daily.     01/07/2015 at Unknown time  . pregabalin (LYRICA) 75 MG capsule Take 1 to 2 capsules 1 hour before sleep 14 capsule 0 01/07/2015 at Unknown time  . [DISCONTINUED] ALPRAZolam (XANAX) 1 MG tablet TAKE 1/2-1 TABLET BY MOUTH 3 TIMES DAILY 90 tablet 1 01/07/2015 at Unknown time  . [DISCONTINUED] oxycodone (ROXICODONE) 30 MG immediate release tablet Take 60  mg by mouth every 4 (four) hours as needed for pain.    01/07/2015 at Unknown time  . [DISCONTINUED] senna (SENOKOT) 8.6 MG TABS tablet Take 4 tablets by mouth at bedtime.   01/07/2015 at Unknown time  . azelastine (ASTELIN) 0.1 % nasal spray 1 to 2 sprays each nostril every 12 hours (Patient taking differently: Place 1-2 sprays into both nostrils 2 (two) times daily as needed for allergies. 1 to 2 sprays each nostril every 12 hours) 30 mL 99 Past  Week at Unknown time  . ondansetron (ZOFRAN) 8 MG tablet 1/2 to 1 tablet up to 3 x daily as needed for nausea. 30 tablet 3 01/03/2015  . testosterone cypionate (DEPOTESTOTERONE CYPIONATE) 200 MG/ML injection Inject 2 cc IM every 2 weeks or as directed by a physician. (Patient taking differently: Inject into the muscle every 28 (twenty-eight) days. Inject 2 cc IM every monthly or as directed by a physician.) 10 mL 2 2 weeks  . [DISCONTINUED] pregabalin (LYRICA) 50 MG capsule Take 1 to 3 capsules 1 hour before sleep (Patient not taking: Reported on 01/08/2015) 21 capsule 0 Not Taking at Unknown time   Scheduled: . antiseptic oral rinse  7 mL Mouth Rinse QID  . [START ON 02/04/2015] aspirin  81 mg Per Tube Daily  . chlorhexidine gluconate  15 mL Mouth Rinse BID  . darbepoetin (ARANESP) injection - DIALYSIS  100 mcg Intravenous Q Mon-HD  . feeding supplement (NEPRO CARB STEADY)  237 mL Oral BID BM  . furosemide      . heparin subcutaneous  5,000 Units Subcutaneous 3 times per day  . lanthanum  500 mg Oral TID WC  . levETIRAcetam  500 mg Intravenous Q12H  . midazolam      . multivitamin with minerals  1 tablet Oral Daily  . pantoprazole sodium  40 mg Per Tube Daily  . sodium chloride  10 mL Intravenous Q12H   Continuous: . propofol (DIPRIVAN) infusion 15 mcg/kg/min (02/03/15 1431)   ZOX:WRUEAV chloride, fentaNYL (SUBLIMAZE) injection, fentaNYL (SUBLIMAZE) injection, midazolam, midazolam, sodium chloride, sodium chloride, technetium TC 69M diethylenetriame-pentaacetic acid   ROS:                                                                                                                                       History obtained from unobtainable from patient due to mental status     Blood pressure 231/72, pulse 67, temperature 98.9 F (37.2 C), temperature source Oral, resp. rate 32, weight 126 kg (277 lb 12.5 oz), SpO2 99 %.   Neurologic Examination:  HEENT-  Normocephalic, no lesions, without obvious abnormality.  Normal external eye and conjunctiva.  Normal TM's bilaterally.  Normal auditory canals and external ears. Normal external nose, mucus membranes and septum.  Normal pharynx. Cardiovascular- S1, S2 normal, pulses palpable throughout   Lungs- chest clear, no wheezing, rales, normal symmetric air entry Abdomen- normal findings: bowel sounds normal Extremities- no edema Lymph-no adenopathy palpable Musculoskeletal-no joint tenderness, deformity or swelling Skin-warm and dry, no hyperpigmentation, vitiligo, or suspicious lesions  Mental Status: Patient does not respond to verbal stimuli. Intubated. Does not respond to deep sternal rub.  Does not follow commands.  No verbalizations noted.  Cranial Nerves: II: patient does not respond confrontation bilaterally, pupils right 3 mm, left 3 mm,and reactive bilaterally III,IV,VI: eyes deviated to the right V,VII: corneal reflex present bilaterally  VIII: patient does not respond to verbal stimuli IX,X: gag reflex absent, XI: trapezius strength unable to test bilaterally XII: tongue strength unable to test Motor: Extremities flaccid throughout.  No spontaneous movement noted.  No purposeful movements noted. Sensory: Does not respond to noxious stimuli in any extremity. Deep Tendon Reflexes:  Absent throughout. Plantars: equivocal bilaterally Cerebellar: Unable to perform       Lab Results: Basic Metabolic Panel:  Recent Labs Lab 01/30/15 0457 01/31/15 0529 02/01/15 0616 02/02/15 0406 02/03/15 0448 02/03/15 1415  NA 136 138 138 136 138 QUESTIONABLE RESULTS, RECOMMEND RECOLLECT TO VERIFY  K 4.3 3.8 4.1 3.9 4.0  --   CL 100* 99* 100* 98* 102  --   CO2 24 29 26 27 26   --   GLUCOSE 93 93 91 96 97  --   BUN 67* 39* 51* 58* 62*  --   CREATININE 14.94* 10.29* 11.59* 12.21* 12.45*  --   CALCIUM 8.1* 8.1* 8.1*  7.9* 8.2*  --   PHOS 6.4* 5.1* 5.7* 6.1* 6.2*  --     Liver Function Tests:  Recent Labs Lab 01/30/15 0457 01/31/15 0529 02/01/15 0616 02/02/15 0406 02/03/15 0448  ALBUMIN 2.0* 2.0* 2.0* 2.0* 2.1*   No results for input(s): LIPASE, AMYLASE in the last 168 hours. No results for input(s): AMMONIA in the last 168 hours.  CBC:  Recent Labs Lab 01/28/15 0445 01/29/15 0505 02/02/15 0406 02/03/15 1415  WBC 7.8 9.0 7.4 7.2  HGB 7.7* 7.8* 9.5* 3.5*  HCT 23.4* 23.5* 29.2* 11.7*  MCV 90.7 91.1 89.6 96.7  PLT 284 284 275 112*    Cardiac Enzymes: No results for input(s): CKTOTAL, CKMB, CKMBINDEX, TROPONINI in the last 168 hours.  Lipid Panel:  Recent Labs Lab 02/03/15 1415  TRIG 63    CBG:  Recent Labs Lab 02/03/15 1354  GLUCAP 168*    Microbiology: Results for orders placed or performed during the hospital encounter of 01/08/15  Blood Culture (routine x 2)     Status: None   Collection Time: 01/08/15 11:31 AM  Result Value Ref Range Status   Specimen Description BLOOD A-LINE  Final   Special Requests BOTTLES DRAWN AEROBIC AND ANAEROBIC 10CC  Final   Culture NO GROWTH 5 DAYS  Final   Report Status 01/13/2015 FINAL  Final  Blood Culture (routine x 2)     Status: None   Collection Time: 01/08/15 12:11 PM  Result Value Ref Range Status   Specimen Description BLOOD LEFT ANTECUBITAL  Final   Special Requests BOTTLES DRAWN AEROBIC AND ANAEROBIC 5CC  Final   Culture NO GROWTH 5 DAYS  Final   Report Status 01/13/2015 FINAL  Final  MRSA PCR Screening     Status: None   Collection Time: 01/08/15  3:30 PM  Result Value Ref Range Status   MRSA by PCR NEGATIVE NEGATIVE Final    Comment:        The GeneXpert MRSA Assay (FDA approved for NASAL specimens only), is one component of a comprehensive MRSA colonization surveillance program. It is not intended to diagnose MRSA infection nor to guide or monitor treatment for MRSA infections.   Urine culture     Status:  None   Collection Time: 01/08/15  4:39 PM  Result Value Ref Range Status   Specimen Description URINE, CATHETERIZED  Final   Special Requests NONE  Final   Culture NO GROWTH 2 DAYS  Final   Report Status 01/10/2015 FINAL  Final  Culture, blood (routine x 2)     Status: None   Collection Time: 01/20/15  3:48 PM  Result Value Ref Range Status   Specimen Description BLOOD PORTA CATH  Final   Special Requests BOTTLES DRAWN AEROBIC AND ANAEROBIC  Final   Culture NO GROWTH 5 DAYS  Final   Report Status 01/25/2015 FINAL  Final  Culture, blood (routine x 2)     Status: None   Collection Time: 01/20/15  4:25 PM  Result Value Ref Range Status   Specimen Description BLOOD PORTA CATH  Final   Special Requests BOTTLES DRAWN AEROBIC AND ANAEROBIC  Final   Culture NO GROWTH 5 DAYS  Final   Report Status 01/25/2015 FINAL  Final  Culture, blood (routine x 2)     Status: None   Collection Time: 01/22/15  3:25 PM  Result Value Ref Range Status   Specimen Description BLOOD LEFT ARM  Final   Special Requests BOTTLES DRAWN AEROBIC ONLY 5CC  Final   Culture NO GROWTH 5 DAYS  Final   Report Status 01/27/2015 FINAL  Final  Culture, blood (routine x 2)     Status: None   Collection Time: 01/22/15  3:30 PM  Result Value Ref Range Status   Specimen Description BLOOD RIGHT ARM  Final   Special Requests BOTTLES DRAWN AEROBIC ONLY 2CC  Final   Culture NO GROWTH 5 DAYS  Final   Report Status 01/27/2015 FINAL  Final  Culture, Urine     Status: None   Collection Time: 01/22/15  6:03 PM  Result Value Ref Range Status   Specimen Description URINE, CATHETERIZED  Final   Special Requests NONE  Final   Culture NO GROWTH 1 DAY  Final   Report Status 01/23/2015 FINAL  Final  Culture, blood (routine x 2)     Status: None   Collection Time: 01/29/15 10:45 PM  Result Value Ref Range Status   Specimen Description BLOOD RIGHT ANTECUBITAL  Final   Special Requests   Final    BOTTLES DRAWN AEROBIC AND  ANAEROBIC 10CC POF ON ANCEF   Culture NO GROWTH 5 DAYS  Final   Report Status 02/03/2015 FINAL  Final  Culture, blood (routine x 2)     Status: None   Collection Time: 01/29/15 11:00 PM  Result Value Ref Range Status   Specimen Description BLOOD LEFT ANTECUBITAL  Final   Special Requests   Final    BOTTLES DRAWN AEROBIC AND ANAEROBIC 10CC POF ON ANCEF   Culture NO GROWTH 5 DAYS  Final   Report Status 02/03/2015 FINAL  Final    Coagulation Studies: No results for input(s): LABPROT, INR in the last  72 hours.  Imaging: Dg Chest Port 1 View  02/03/2015   CLINICAL DATA:  Endotracheal and orogastric tubes. Seizure. Question aspiration.  EXAM: PORTABLE CHEST - 1 VIEW  COMPARISON:  01/29/2015  FINDINGS: Endotracheal tube terminates 7.5 cm above carina. Orogastric tube extends beyond the inferior aspect of the film. Right internal jugular line terminates at the mid right atrium. Cervical spine fixation. Midline trachea. Cardiomegaly accentuated by AP portable technique. No pleural effusion or pneumothorax. Right lung is clear. The left lung base is suboptimally evaluated secondary to overlying external pacer/ defibrillator. No congestive failure.  IMPRESSION: Cardiomegaly without congestive failure.  Suboptimal evaluation of the left lung base, secondary to overlying support apparatus. Otherwise, no evidence of aspiration.  Endotracheal tube appropriately positioned.   Electronically Signed   By: Jeronimo Greaves M.D.   On: 02/03/2015 14:55    Felicie Morn PA-C [facility] [phone]  02/03/2015, 4:12 PM   Assessment/Plan: [name] presenting to hospital with sepsis and no source Identified. Today patient was noted to have a TC seizure and not returning to baseline.  After receiving 10 mg Versed, 1 gram Keppra the Propofol was increased to point no clinical seizure noted. Currently he is on Keppra 1 Gram BID.  EEG obtained and shows right central sharp wave activity, in addition to  patient having conjugate deviation of eyes to the right side indicating likely ongoing seizure activity.  Additional 200 mg Vimpat stat ordered with maintenance of 100 mg BID.    Recommend: 1) LTM EEG monitoring for management of status epilepticus 2) Keppra 1 gram BID and Vimpat 100 mg BID 3) Stat CT scan of the head without contrast  We will continue to follow this patient with you.  Personally participated in this patient's evaluation and management, including clinical examination, as well as formulating above clinical impression and management recommendations. CT scan showed findings indicative of possible PRES. No acute hemorrhage was seen.  This patient is critically ill and at significant risk of neurological worsening or death, and care requires constant monitoring of vital signs, hemodynamics,respiratory and cardiac monitoring, neurological assessment, discussion with family, other specialists and medical decision making of high complexity. Total critical care time was 60 minutes.  Venetia Maxon M.D. [facility] [phone]

## 2015-02-03 NOTE — Progress Notes (Signed)
ETT advanced 2.5cm per MD order.

## 2015-02-03 NOTE — Progress Notes (Signed)
LTM video EEG started.

## 2015-02-03 NOTE — Progress Notes (Signed)
PCCM and [facility] TEAM 1 TRANSFER Progress Note  Jared [name] TMA:[id number] DOB: 13-Feb-1956 DOA: 01/08/2015 PCP: Nadean Corwin, MD  Admit HPI / Brief Narrative: 59 year old M Hx Depression, Anxiety, HTN, HLD, OSA, and Chronic Back Pain who presented 8/18 after being found down at home. Responsive to narcan via EMS. In ED was profoundly hypotensive with elevated WBC, refractory to volume. In ER noted to have lactate 8.5, hyperkalemia and started on pressors. PCCM admitted the pt.    Significant Events: 8/18 Admit - [facility], Cardiology consulted; brief PEA arrest; exploratory laparotomy with placement of wound vac; started heparin gtt for presumed PE 8/20 CRRT  8/21 OR for abd wound closure  8/22 Anuric. On CRRT. Off pressors. Improved Shock Liver. On fent gtt and heparing gtt. 40% fio2 8/23 ileus 8/23 Cardiac catheterization left circumflex, second obtuse marginal branch,Ost 2d Mrg-2d Mrg lesion or percent stenosed 8/25 HIT Ab screen normal - TNA initiated  8/26 Bleeding around abd wound - surface bled and IV heparin held  9/5 patient was transferred to telemetry floor , received HD on 9/5  HPI/Subjective: Sleepy this am, headache overnight given phenergan, toradol and benadryl Also got IV dilaudid earlier this am  Assessment/Plan:  Sepsis with presumed abdominal source > exp lap unrevealing -improved, source unclear  -Vanc stopped 8/23 - Zosyn stopped 8/26 - temp up to 101 8/30 - Vanc resumed 8/30 - HD catheter removed 8/30 - new temp cath placed 8/31 right femoral - WBC has normalized - the patient is afebrile  - follow up blood cultures negative 4 thus far - afebrile and WBC normalized now  Acute renal failure/ATN with metab acidosis and hyperkalemia in CKD  - ATN from sepsis, Renal following - CRRT started per renal 8/20 - stopped 01/15/15  - On HD since 01/16/15 - RF and pan autoimmune negative 01/14/15 - baseline creatinine 1.14 from 12/17/14 - per Nephrology, s/p  Tunneled HD cath 9/8  - HD dependent now per Renal, being clipped for Outpatient HD -urine output still good  RV failure -RA and RV dliated w/ R heart failure on TTE  -no CTA due to renal failure , venous duplex w/o evidence of DVT  -was on IV heparin, VQ scan with low probability of PE and hence stopped IV heparin  Anemia  -stable, now -due to acute illness/critical illness -1 unit PRBC transfused 9/9  CAD Cardiac cath noted an occluded OM - Cardiology has evaluated and plans no further cardiac workup  Acute abdominal pain s/p laparotomy -wound VAC now discontinued - continue with wound care per general surgery recommendations -healing well  Metabolic encephalopathy -Appears to be multifactorial  -resolved -drowsy from early am meds, if doesn't improve will check CT head  OSA -CPAP per respiratory, though patient is frequently refusing  Anemia/thrombocytopenia - Most likely multifactorial to include acute illness, sepsis - HIT panel negative - platelet count is climbing  Shock liver -LFTs have nearly normalized  A- Fib -Remains in NSR - off amio since 01/11/15  Systolic CHF/RV dilation -volume management per HD   Pulmonary hypertension  HTN -BP on higher side, start Amlodipine  Hyperglycemia  -CBG currently normal   DVT prophylaxis: Heparin SQ  Code Status: FULL Family Communication: wife at bedside Disposition Plan: - home when clipping for HD completed  Consultants: PCCM Gen Surgery  [facility] Cardiology  Nephrology  Procedure/Significant Events: 8/18 Echo - EF 45 to 50%, mod RV dilation, mod/severe RV dysfx, PAS 47 mmHg 8/18 Renal u/s -  b/l renal echogenicity  Objective: Blood pressure 148/80, pulse 67, temperature 98.9 F (37.2 C), temperature source Oral, resp. rate 18, weight 126.69 kg (279 lb 4.8 oz), SpO2 96 %.  Intake/Output Summary (Last 24 hours) at 02/03/15 1411 Last data filed at 02/03/15 4782  Gross per 24 hour  Intake    480 ml    Output   1600 ml  Net  -1120 ml   Exam: General: AAOx3 HEENt: TLC in neck Lungs: Clear to auscultation bilaterally without wheeze  Cardiovascular: Regular rate and rhythm without murmur  Abdomen: Large midline wound, healing well with pink granulation tissue,  dressed and dry, + bowel sounds, soft, no rebound  Extremities: No significant cyanosis or clubbing - 1+ B LE edema, HD cath in R groin  Data Reviewed: Basic Metabolic Panel:  Recent Labs Lab 01/30/15 0457 01/31/15 0529 02/01/15 0616 02/02/15 0406 02/03/15 0448  NA 136 138 138 136 138  K 4.3 3.8 4.1 3.9 4.0  CL 100* 99* 100* 98* 102  CO2 GLUCOSE 93 93 91 96 97  BUN 67* 39* 51* 58* 62*  CREATININE 14.94* 10.29* 11.59* 12.21* 12.45*  CALCIUM 8.1* 8.1* 8.1* 7.9* 8.2*  PHOS 6.4* 5.1* 5.7* 6.1* 6.2*     Liver Function Tests:  Recent Labs Lab 01/30/15 0457 01/31/15 0529 02/01/15 0616 02/02/15 0406 02/03/15 0448  ALBUMIN 2.0* 2.0* 2.0* 2.0* 2.1*    CBC:  Recent Labs Lab 01/28/15 0445 01/29/15 0505 02/02/15 0406  WBC 7.8 9.0 7.4  HGB 7.7* 7.8* 9.5*  HCT 23.4* 23.5* 29.2*  MCV 90.7 91.1 89.6  PLT 284 284 275    CBG:  Recent Labs Lab 02/03/15 1354  GLUCAP 168*    Recent Results (from the past 240 hour(s))  Culture, blood (routine x 2)     Status: None (Preliminary result)   Collection Time: 01/29/15 10:45 PM  Result Value Ref Range Status   Specimen Description BLOOD RIGHT ANTECUBITAL  Final   Special Requests   Final    BOTTLES DRAWN AEROBIC AND ANAEROBIC 10CC POF ON ANCEF   Culture NO GROWTH 4 DAYS  Final   Report Status PENDING  Incomplete  Culture, blood (routine x 2)     Status: None (Preliminary result)   Collection Time: 01/29/15 11:00 PM  Result Value Ref Range Status   Specimen Description BLOOD LEFT ANTECUBITAL  Final   Special Requests   Final    BOTTLES DRAWN AEROBIC AND ANAEROBIC 10CC POF ON ANCEF   Culture NO GROWTH 4 DAYS  Final   Report Status PENDING   Incomplete       Scheduled Meds:  Scheduled Meds: . [START ON 02/04/2015] amLODipine  10 mg Oral Daily  . antiseptic oral rinse  7 mL Mouth Rinse QID  . aspirin  81 mg Oral Daily  . chlorhexidine gluconate  15 mL Mouth Rinse BID  . darbepoetin (ARANESP) injection - DIALYSIS  100 mcg Intravenous Q Mon-HD  . feeding supplement (NEPRO CARB STEADY)  237 mL Oral BID BM  . furosemide      . heparin subcutaneous  5,000 Units Subcutaneous 3 times per day  . lanthanum  500 mg Oral TID WC  . multivitamin with minerals  1 tablet Oral Daily  . nebivolol  5 mg Oral Daily  . pantoprazole  40 mg Oral QHS  . sodium chloride  10 mL Intravenous Q12H    Time spent on care of this patient: 30 mins  Zannie Cove [phone] [facility] For Consults/Admissions - Flow Manager - [phone] Office  [phone]  Contact MD directly via text page:      [email or web address]      password [email or web address]  02/03/2015, 2:11 PM   LOS: 26 days

## 2015-02-03 NOTE — Progress Notes (Signed)
ABG results given to P. Mikey Bussing, NP CCM. Increased RR to 25 per verbal order. RT will continue to monitor.

## 2015-02-03 NOTE — Significant Event (Signed)
Rapid Response Event Note  Overview: Time Called: 1332 Arrival Time: 1337 Event Type: Neurologic  Initial Focused Assessment: Called to patients bedside after staff witnessed Grandmal seizure. Upon my arrival patient unresponsive staring to the right.  Pupils dilated. Acute respiratory distress, skin pale and diaphoretic.  Lung sounds with rhonchi up to clavicle.  Frothy white secretions.  BP 231/72  HR 110  RR 32  O2 sat 92% on .    Interventions: Placed on NRB sats 99% MD and RT at bedside Patient had a 2nd grand mal seizure, bit tongue. 1mg  Ativan given IV. Agonal respirations, no loss of pulse. Code Blue called Patient intubated by CRNA. Transported to 2B63.  Patient with focal seizures upon arrival.  2mg  Versed given.   Event Summary: Name of Physician Notified: Dr Jomarie Longs at 1340  Name of Consulting Physician Notified: PCCM, Joneen Roach at 1350  Outcome: Coded and survived  Event End Time: 1444  Jared [name]

## 2015-02-04 ENCOUNTER — Inpatient Hospital Stay (HOSPITAL_COMMUNITY): Payer: Medicare Other

## 2015-02-04 DIAGNOSIS — R569 Unspecified convulsions: Secondary | ICD-10-CM | POA: Diagnosis not present

## 2015-02-04 DIAGNOSIS — G9340 Encephalopathy, unspecified: Secondary | ICD-10-CM

## 2015-02-04 DIAGNOSIS — N186 End stage renal disease: Secondary | ICD-10-CM

## 2015-02-04 DIAGNOSIS — Z992 Dependence on renal dialysis: Secondary | ICD-10-CM

## 2015-02-04 LAB — POCT I-STAT 3, ART BLOOD GAS (G3+)
ACID-BASE DEFICIT: 19 mmol/L — AB (ref 0.0–2.0)
ACID-BASE EXCESS: 2 mmol/L (ref 0.0–2.0)
Acid-Base Excess: 2 mmol/L (ref 0.0–2.0)
BICARBONATE: 23.9 meq/L (ref 20.0–24.0)
Bicarbonate: 10.4 mEq/L — ABNORMAL LOW (ref 20.0–24.0)
Bicarbonate: 26.2 mEq/L — ABNORMAL HIGH (ref 20.0–24.0)
O2 SAT: 98 %
O2 SAT: 90 %
O2 Saturation: 98 %
PCO2 ART: 26.4 mmHg — AB (ref 35.0–45.0)
PH ART: 7.069 — AB (ref 7.350–7.450)
PH ART: 7.453 — AB (ref 7.350–7.450)
PO2 ART: 141 mmHg — AB (ref 80.0–100.0)
PO2 ART: 82 mmHg (ref 80.0–100.0)
PO2 ART: 57 mmHg — AB (ref 80.0–100.0)
Patient temperature: 98.9
TCO2: 12 mmol/L (ref 0–100)
TCO2: 25 mmol/L (ref 0–100)
TCO2: 27 mmol/L (ref 0–100)
pCO2 arterial: 36.2 mmHg (ref 35.0–45.0)
pCO2 arterial: 37.5 mmHg (ref 35.0–45.0)
pH, Arterial: 7.565 — ABNORMAL HIGH (ref 7.350–7.450)

## 2015-02-04 LAB — CBC WITH DIFFERENTIAL/PLATELET
BASOS ABS: 0 10*3/uL (ref 0.0–0.1)
BASOS PCT: 0 %
EOS ABS: 0 10*3/uL (ref 0.0–0.7)
Eosinophils Relative: 0 %
HEMATOCRIT: 29.2 % — AB (ref 39.0–52.0)
HEMOGLOBIN: 9.8 g/dL — AB (ref 13.0–17.0)
Lymphocytes Relative: 4 %
Lymphs Abs: 0.5 10*3/uL — ABNORMAL LOW (ref 0.7–4.0)
MCH: 29.3 pg (ref 26.0–34.0)
MCHC: 33.6 g/dL (ref 30.0–36.0)
MCV: 87.2 fL (ref 78.0–100.0)
MONO ABS: 0.6 10*3/uL (ref 0.1–1.0)
MONOS PCT: 6 %
NEUTROS ABS: 10.2 10*3/uL — AB (ref 1.7–7.7)
NEUTROS PCT: 90 %
Platelets: 238 10*3/uL (ref 150–400)
RBC: 3.35 MIL/uL — ABNORMAL LOW (ref 4.22–5.81)
RDW: 15.3 % (ref 11.5–15.5)
WBC: 11.3 10*3/uL — ABNORMAL HIGH (ref 4.0–10.5)

## 2015-02-04 LAB — RENAL FUNCTION PANEL
ANION GAP: 12 (ref 5–15)
Albumin: 2.1 g/dL — ABNORMAL LOW (ref 3.5–5.0)
BUN: 46 mg/dL — ABNORMAL HIGH (ref 6–20)
CHLORIDE: 103 mmol/L (ref 101–111)
CO2: 25 mmol/L (ref 22–32)
CREATININE: 8.49 mg/dL — AB (ref 0.61–1.24)
Calcium: 8.2 mg/dL — ABNORMAL LOW (ref 8.9–10.3)
GFR, EST AFRICAN AMERICAN: 7 mL/min — AB (ref >60)
GFR, EST NON AFRICAN AMERICAN: 6 mL/min — AB (ref >60)
Glucose, Bld: 114 mg/dL — ABNORMAL HIGH (ref 65–99)
POTASSIUM: 4 mmol/L (ref 3.5–5.1)
Phosphorus: 3.3 mg/dL (ref 2.5–4.6)
Sodium: 140 mmol/L (ref 135–145)

## 2015-02-04 LAB — GLUCOSE, CAPILLARY
GLUCOSE-CAPILLARY: 113 mg/dL — AB (ref 65–99)
GLUCOSE-CAPILLARY: 104 mg/dL — AB (ref 65–99)
Glucose-Capillary: 100 mg/dL — ABNORMAL HIGH (ref 65–99)
Glucose-Capillary: 101 mg/dL — ABNORMAL HIGH (ref 65–99)
Glucose-Capillary: 108 mg/dL — ABNORMAL HIGH (ref 65–99)
Glucose-Capillary: 95 mg/dL (ref 65–99)

## 2015-02-04 LAB — TROPONIN I: TROPONIN I: 1.54 ng/mL — AB (ref <0.031)

## 2015-02-04 LAB — TRIGLYCERIDES: TRIGLYCERIDES: 361 mg/dL — AB (ref <150)

## 2015-02-04 LAB — MAGNESIUM: MAGNESIUM: 2.2 mg/dL (ref 1.7–2.4)

## 2015-02-04 LAB — PROCALCITONIN: PROCALCITONIN: 21.41 ng/mL

## 2015-02-04 LAB — LACTIC ACID, PLASMA
LACTIC ACID, VENOUS: 0.9 mmol/L (ref 0.5–2.0)
Lactic Acid, Venous: 1.2 mmol/L (ref 0.5–2.0)

## 2015-02-04 MED ORDER — AMLODIPINE BESYLATE 10 MG PO TABS
10.0000 mg | ORAL_TABLET | Freq: Every day | ORAL | Status: DC
  Administered 2015-02-04 – 2015-02-11 (×8): 10 mg via ORAL
  Filled 2015-02-04 (×8): qty 1

## 2015-02-04 NOTE — Progress Notes (Signed)
VASCULAR LAB PRELIMINARY  PRELIMINARY  PRELIMINARY  PRELIMINARY  Right  Upper Extremity Vein Map    Cephalic - Thrombosed right cephalic vein. Results given to patient's nurse.  Segment Diameter Depth Comment  1. Axilla 3.31mm mm Thrombosed  2. Mid upper arm 5.32mm mm Thrombosed  3. Above [facility] 6.59mm mm Thrombosed  4. In [facility] 5.25mm mm Thrombosed  5. Below AC 3.45mm mm Thrombosed  6. Mid forearm 3.44mm mm Thrombosed  7. Wrist 3.41mm mm Thrombosed   Basilic  Segment Diameter Depth Comment  1. Axilla 7.2mm 11.58mm   2. Mid upper arm 3.18mm 11.39mm   3. Above AC 3.36mm 1.63mm branch  4. In AC 2.49mm mm   5. Below AC 2.65mm mm   6. Mid forearm 1.21mm mm Too small  7. Wrist 1.65mm mm    mm mm    mm mm    mm mm     Left Upper Extremity Vein Map    Cephalic - Findings are consistent with thrombosed left cephalic vein.  Segment Diameter Depth Comment  1. Axilla 4.55mm mm   2. Mid upper arm 4.42mm mm Thrombosed  3. Above [facility] 5.59mm mm Thrombosed  4. In AC 6.20mm mm Thrombosed  5. Below AC 5.66mm mm Thrombosed  6. Mid forearm 5.87mm mm   7. Wrist 3.89mm mm    mm mm    Basilic  Segment Diameter Depth Comment  1. Axilla 8.24mm 1.35mm   2. Mid upper arm 1.48mm mm   3. Above AC 5.64mm mm   4. In AC mm mm Not visalized  5. Below AC mm mm Not visalized  6. Mid forearm mm mm   7. Wrist mm mm    mm mm    mm mm    mm mm     Ottilia Pippenger, Gerarda Gunther, RVT 02/04/2015, 3:55 PM    Zaryiah Barz, Gerarda Gunther, RVT 02/04/2015, 3:39 PM

## 2015-02-04 NOTE — Progress Notes (Signed)
Patient ID: Jared [name], male   DOB: [birth year], 59 y.o.   MRN: [id number]  [facility] Progress Note    Assessment/ Plan:   1. 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 Hemodialysis done yesterday for clearance (he had refused dialysis earlier on in the day but unclear as to whether this had anything to do in precipitating a seizure given stability of BUN/creatinine over the past 72 hours). He has good urine output and there is some small degree of optimism that he may have some renal recovery but for now-managing him as if he has ESRD. Consulted vascular surgery yesterday for access however-he decompensated having suffered a seizure episode. 2. SIRS- clinical PE (RA and RV dilated, RV hypocontractile on ECHO) for which she was transiently on anticoagulation - VQ low prob so heparin stopped 3. CAD- cath revealed occluded OM, ongoing medical management 4. S/p exploratory lap and fascia closure now s/p wound vac - per CCS/wound care 5. Nutrition: off TPN, taking some PO, continue oral nutritional supplementation 6. 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 Seizure episode yesterday: Ongoing neurology workup  Subjective:   Patient suffered a generalized tonic-clonic seizure yesterday on the renal floor prompting transfer to the MICU after intubated for airway protection.    Objective:   BP 144/78 mmHg  Pulse 64  Temp(Src) 99.9 F (37.7 C) (Oral)  Resp 25  Wt 125.5 kg (276 lb 10.8 oz)  SpO2 97%  Intake/Output Summary (Last 24 hours) at 02/04/15 0827 Last data filed at  02/04/15 0600  Gross per 24 hour  Intake 1142.88 ml  Output   2252 ml  Net -1109.12 ml   Weight change: -0.69 kg (-1 lb 8.3 oz)  Physical Exam: Gen: Intubated, sedated CVS: Pulse regular in rate and rhythm, S1 and S2 normal Resp: Decreased breath sounds over bases otherwise clear Abd: Soft, obese, abdominal binder in situ Ext: Trace lower extremity edema  Imaging: Ct Head Wo Contrast  02/03/2015   CLINICAL DATA:  59 year old hypertensive male with seizure on ventilator. Initial encounter.  EXAM: CT HEAD WITHOUT CONTRAST  TECHNIQUE: Contiguous axial images were obtained from the base of the skull through the vertex without intravenous contrast.  COMPARISON:  05/04/2005.  FINDINGS: Relatively symmetric hypodensity occipital lobes extending into the parietal lobes in a distribution consistent with posterior reversible encephalopathy syndrome.  No intracranial hemorrhage.  No CT evidence of acute large thrombotic infarct separate from above described findings.  No intracranial mass lesion noted on this unenhanced exam.  No hydrocephalus.  Partial opacification mastoid air cells. No obvious eustachian tube obstructing lesion. Pooling of secretions posterior superior nasopharynx.  Exophthalmos.  Vascular calcifications.  IMPRESSION: Findings suggestive of posterior reversible encephalopathy syndrome as noted above.  These results will be called to the ordering clinician or representative by the Radiologist Assistant, and communication documented in the PACS or zVision Dashboard.   Electronically Signed   By: Lacy Duverney M.D.   On: 02/03/2015 17:35   Dg Chest Port 1 View  02/03/2015   CLINICAL DATA:  Endotracheal and orogastric tubes. Seizure. Question aspiration.  EXAM:  PORTABLE CHEST - 1 VIEW  COMPARISON:  01/29/2015  FINDINGS: Endotracheal tube terminates 7.5 cm above carina. Orogastric tube extends beyond the inferior aspect of the film. Right internal jugular line terminates at the mid right  atrium. Cervical spine fixation. Midline trachea. Cardiomegaly accentuated by AP portable technique. No pleural effusion or pneumothorax. Right lung is clear. The left lung base is suboptimally evaluated secondary to overlying external pacer/ defibrillator. No congestive failure.  IMPRESSION: Cardiomegaly without congestive failure.  Suboptimal evaluation of the left lung base, secondary to overlying support apparatus. Otherwise, no evidence of aspiration.  Endotracheal tube appropriately positioned.   Electronically Signed   By: Jeronimo Greaves M.D.   On: 02/03/2015 14:55    Labs: BMET  Recent Labs Lab 01/29/15 0505 01/30/15 0457 01/31/15 0529 02/01/15 0616 02/02/15 0406 02/03/15 0448 02/03/15 1415 02/03/15 1610 02/04/15 0238  NA 134* 136 138 138 136 138 QUESTIONABLE RESULTS, RECOMMEND RECOLLECT TO VERIFY 137 140  K 4.3 4.3 3.8 4.1 3.9 4.0  --  4.6 4.0  CL 98* 100* 99* 100* 98* 102  --  101 103  CO2 24 24 29 26 27 26   --  19* 25  GLUCOSE 92 93 93 91 96 97  --  162* 114*  BUN 58* 67* 39* 51* 58* 62*  --  67* 46*  CREATININE 13.24* 14.94* 10.29* 11.59* 12.21* 12.45*  --  13.21* 8.49*  CALCIUM 7.9* 8.1* 8.1* 8.1* 7.9* 8.2*  --  8.1* 8.2*  PHOS 5.0* 6.4* 5.1* 5.7* 6.1* 6.2*  --   --  3.3   CBC  Recent Labs Lab 02/02/15 0406 02/03/15 1415 02/03/15 1610 02/04/15 0238  WBC 7.4 7.2 20.9* 11.3*  NEUTROABS  --   --   --  10.2*  HGB 9.5* 3.5* 10.4* 9.8*  HCT 29.2* 11.7* 31.1* 29.2*  MCV 89.6 96.7 87.9 87.2  PLT 275 112* 305 238   Medications:    . antiseptic oral rinse  7 mL Mouth Rinse QID  . aspirin  81 mg Per Tube Daily  . chlorhexidine gluconate  15 mL Mouth Rinse BID  . darbepoetin (ARANESP) injection - DIALYSIS  100 mcg Intravenous Q Mon-HD  . feeding supplement (NEPRO CARB STEADY)  237 mL Oral BID BM  . heparin subcutaneous  5,000 Units Subcutaneous 3 times per day  . lacosamide (VIMPAT) IV  100 mg Intravenous Q12H  . lanthanum  500 mg Oral TID WC  . levETIRAcetam  1,000  mg Intravenous Q12H  . multivitamin with minerals  1 tablet Oral Daily  . pantoprazole sodium  40 mg Per Tube Daily  . sodium chloride  10 mL Intravenous Q12H   Zetta Bills, MD 02/04/2015, 8:27 AM

## 2015-02-04 NOTE — Progress Notes (Signed)
RT notified MD and RN of pt's ABG results with a PO2 of 57. RT increased FIO2 to 50%. RT will continue to monitor.

## 2015-02-04 NOTE — Progress Notes (Signed)
LTM EEG checked, all connections intact and good impedances. No skin breakdown noted.

## 2015-02-04 NOTE — Progress Notes (Signed)
PT Cancellation Note  Patient Details Name: Jared [name] MRN: [id number] DOB: 1955/07/15   Cancelled Treatment:    Reason Eval/Treat Not Completed: Medical issues which prohibited therapy. Noted new seizure activity and transfer to ICU yesterday. Discussed pt case with RN who states that pt is sedated at this time and not appropriate for PT. Will check back to assess for medical readiness to participate.    Conni Slipper 02/04/2015, 12:35 PM   Conni Slipper, PT, DPT Acute Rehabilitation Services Pager: [phone]

## 2015-02-04 NOTE — Progress Notes (Signed)
Subjective: No recurrence of seizure activity reported overnight. Patient has remained sedated with propofol and is currently at 80 mcg/kg/m. He has reportedly shown an increase in alertness and agitation on attempts to wean propofol.  Objective: Current vital signs: BP 144/78 mmHg  Pulse 64  Temp(Src) 99.9 F (37.7 C) (Oral)  Resp 25  Wt 125.5 kg (276 lb 10.8 oz)  SpO2 97%  Neurologic Exam: Patient was intubated and on mechanical ventilation. He appeared to have spontaneous respirations as well. He was minimally responsive to noxious stimuli. Pupils were equal and reacted sluggishly to light. Extraocular movements were absent with oculocephalic maneuvers. Face was symmetrical. Muscle tone was flaccid throughout. There was no abnormal posturing.  Medications: I have reviewed the patient's current medications.  Assessment/Plan: 59 year old man with complicated clinical course with sepsis and respiratory failure and new onset seizure activity on 02/03/2015. T scan showed findings of possible PRES. Seizures appear to be controlled at this point with a combination of Keppra and Vimpat, as well as propofol drip. EEG is currently showing PLEDs, but no frank seizure activity.  I'm recommending no changes in current doses of Keppra and Vimpat. Propofol will be slowly weaned and EEG monitoring continued for possible recurrence of seizure activity.  Recommend MRI of the brain when feasible.  We will continue to follow this patient closely with you.  C.R. Roseanne Reno, MD [facility] [phone]  02/04/2015  8:36 AM

## 2015-02-04 NOTE — Progress Notes (Addendum)
PULMONARY / CRITICAL CARE MEDICINE   Name: Jared [name] MRN: [id number] DOB: 1955-12-24    ADMISSION DATE:  01/08/2015 CONSULTATION DATE:  8/18  REFERRING MD :  EDP Dr Criss Alvine  CHIEF COMPLAINT:  Found down  BRIEF DESCRIPTION:   60 yo male had severe abdominal pain, and then found unresponsive at home.  In ER he was in shock, cyanotic, with lactic acidosis, and hyperkalemia. Initial suspicion was for PE, however VQ was low prob. Etiology for such acute deterioration remains unclear, but is presumed to be occult septic shock by exclusion of PE, MI, bowel ischemia. Has been recovering, however has required continued dialysis. 9/13 suffered respiratory arrest and seizures requiring intubation and ICU transfer. Transferred to the floor. Admit back to ICU on 9/13 after seizures, PRES syndrome. Intubated for resp failure.   STUDIES:  8/18 Echo > EF 45 to 50%, mod RV dilation, mod/severe RV dysfx, PAS 47 mmHg 8/18 Renal u/s >> b/l renal echogenicity 8/23 LHC - left circumflex, second obtuse marginal branch 100% occlusion   SIGNIFICANT EVENTS: 8/18 Admit, CCS, cardiology consulted; brief PEA arrest; laparotomy with placement of wound vac; started heparin gtt for presumed PE 8/19 Renal consulted. Duplex LE negative for DVT 8/20 CRRT  8/21 OR for abd wound closure  8/23  Ileus, vanc stopped 8/26  Tolerating intemittent HD - oliguric. Vanc Stopped 8/27  hypertensive - on hydralazine prn. Anuric. Being weaned off dilaudid.  8/28  Extubated  TPN.  9/5 to tele 9/13 Seizure, resp arrest > intubated > to ICU   SUBJECTIVE/OVERNIGHT/INTERVAL HX 59 year old male with description as above. He was had been slowly recovering to the degree that he was transferred to telemetry floor 9/5. Since that time he had still been requiring HD, and has give little hope for renal recovery. He was doing well and even ambulating with his family on the unit. Most of his acute issues for his hospitalization had mostly  resolved or were close to resolving. 9/12 he developed headache and had gotten some phenergan, Toradol. and benadryl. Also got IV dilaudid a little later. 9/13 patient has drowsy, and family reports that he was complaining of nausea, headache, and face tingling. He then had a witnessed grand mal seizure. Rapid response was called and upon the RN's arrival he was seizing with dilated pupils and R lateral gaze preference. He was in acute respiratory distress with pale, diaphoretic skin. Breath sounds were coarse with frothy white secretions. He was hypertensive. He was given supplemental O2. He then had another seizure and bit tongue. He was given ativan 2mg , code blue was called. He never lost pulses, or relative hemodynamic stability.  And he was eventually intubated by CRNA. He was transferred to ICU. Once RSI meds wore off he began seizing again, which was refractory to an additional 8 mg of versed. He was started on propofol and clinical seizures seemingly have stopped. PCCM to assume care.   VITAL SIGNS: Temp:  [99.2 F (37.3 C)-101.1 F (38.4 C)] 99.9 F (37.7 C) (09/14 0824) Pulse Rate:  [64-112] 73 (09/14 0900) Resp:  [14-32] 25 (09/14 0900) BP: (119-231)/(66-151) 160/74 mmHg (09/14 0900) SpO2:  [92 %-100 %] 94 % (09/14 0900) FiO2 (%):  [40 %-100 %] 40 % (09/14 0812) Weight:  [276 lb 10.8 oz (125.5 kg)-278 lb 14.1 oz (126.5 kg)] 276 lb 10.8 oz (125.5 kg) (09/14 0440) HEMODYNAMICS:   VENTILATOR SETTINGS: Vent Mode:  [-] PRVC FiO2 (%):  [40 %-100 %] 40 % Set  Rate:  [18 bmp-25 bmp] 25 bmp Vt Set:  [670 mL] 670 mL PEEP:  [5 cmH20] 5 cmH20 Plateau Pressure:  [17 cmH20-19 cmH20] 19 cmH20 INTAKE / OUTPUT:  Intake/Output Summary (Last 24 hours) at 02/04/15 1044 Last data filed at 02/04/15 0900  Gross per 24 hour  Intake 1502.11 ml  Output   2452 ml  Net -949.89 ml   Total I/O In: 158.4 [I.V.:128.4; NG/GT:30] Out: 200 [Urine:200]  I/O last 3 completed shifts: In: 1343.7  [I.V.:1123.7; NG/GT:30; IV Piggyback:190] Out: 3552 [Urine:2550; Other:1002]  PHYSICAL EXAMINATION: General: Sedated, On vent. Neuro: Sedated on propofol.  HEENT: Moist mucus membranes. No JVD. Cardiovascular: RRR, no MRG. Lungs: Coarse breath sounds. Abdomen: Obese, soft, surgical dressing in place.  Musculoskeletal: No acute deformity.  LABS:  PULMONARY  Recent Labs Lab 02/03/15 1748  PHART 7.565*  PCO2ART 26.4*  PO2ART 82.0  HCO3 23.9  TCO2 25  O2SAT 98.0    CBC  Recent Labs Lab 02/03/15 1415 02/03/15 1610 02/04/15 0238  HGB 3.5* 10.4* 9.8*  HCT 11.7* 31.1* 29.2*  WBC 7.2 20.9* 11.3*  PLT 112* 305 238   COAGULATION  Recent Labs Lab 01/29/15 0505  INR 1.31    CARDIAC    Recent Labs Lab 02/03/15 1610 02/03/15 2010 02/04/15 0238  TROPONINI 1.12* 1.99* 1.54*   No results for input(s): PROBNP in the last 168 hours.  CHEMISTRY  Recent Labs Lab 01/31/15 0529 02/01/15 0616 02/02/15 0406 02/03/15 0448 02/03/15 1415 02/03/15 1610 02/04/15 0238  NA 138 138 136 138 QUESTIONABLE RESULTS, RECOMMEND RECOLLECT TO VERIFY 137 140  K 3.8 4.1 3.9 4.0  --  4.6 4.0  CL 99* 100* 98* 102  --  101 103  CO2 29 26 27 26   --  19* 25  GLUCOSE 93 91 96 97  --  162* 114*  BUN 39* 51* 58* 62*  --  67* 46*  CREATININE 10.29* 11.59* 12.21* 12.45*  --  13.21* 8.49*  CALCIUM 8.1* 8.1* 7.9* 8.2*  --  8.1* 8.2*  MG  --   --   --   --   --   --  2.2  PHOS 5.1* 5.7* 6.1* 6.2*  --   --  3.3   Estimated Creatinine Clearance: 12.9 mL/min (by C-G formula based on Cr of 8.49).  LIVER  Recent Labs Lab 01/29/15 0505  02/01/15 0616 02/02/15 0406 02/03/15 0448 02/03/15 1610 02/04/15 0238  AST  --   --   --   --   --  48*  --   ALT  --   --   --   --   --  12*  --   ALKPHOS  --   --   --   --   --  108  --   BILITOT  --   --   --   --   --  1.0  --   PROT  --   --   --   --   --  5.6*  --   ALBUMIN 1.9*  < > 2.0* 2.0* 2.1* 2.1* 2.1*  INR 1.31  --   --   --   --    --   --   < > = values in this interval not displayed. INFECTIOUS  Recent Labs Lab 02/03/15 1415 02/03/15 2010 02/04/15 0238  LATICACIDVEN 4.5* 1.4 0.9  PROCALCITON  --  11.75 21.41   ENDOCRINE CBG (last 3)   Recent Labs  02/04/15  0006 02/04/15 0342 02/04/15 0823  GLUCAP 108* 100* 113*   IMAGING x48h Ct Head Wo Contrast  02/03/2015   CLINICAL DATA:  59 year old hypertensive male with seizure on ventilator. Initial encounter.  EXAM: CT HEAD WITHOUT CONTRAST  TECHNIQUE: Contiguous axial images were obtained from the base of the skull through the vertex without intravenous contrast.  COMPARISON:  05/04/2005.  FINDINGS: Relatively symmetric hypodensity occipital lobes extending into the parietal lobes in a distribution consistent with posterior reversible encephalopathy syndrome.  No intracranial hemorrhage.  No CT evidence of acute large thrombotic infarct separate from above described findings.  No intracranial mass lesion noted on this unenhanced exam.  No hydrocephalus.  Partial opacification mastoid air cells. No obvious eustachian tube obstructing lesion. Pooling of secretions posterior superior nasopharynx.  Exophthalmos.  Vascular calcifications.  IMPRESSION: Findings suggestive of posterior reversible encephalopathy syndrome as noted above.  These results will be called to the ordering clinician or representative by the Radiologist Assistant, and communication documented in the PACS or zVision Dashboard.   Electronically Signed   By: Lacy Duverney M.D.   On: 02/03/2015 17:35   Dg Chest Port 1 View  02/03/2015   CLINICAL DATA:  Endotracheal and orogastric tubes. Seizure. Question aspiration.  EXAM: PORTABLE CHEST - 1 VIEW  COMPARISON:  01/29/2015  FINDINGS: Endotracheal tube terminates 7.5 cm above carina. Orogastric tube extends beyond the inferior aspect of the film. Right internal jugular line terminates at the mid right atrium. Cervical spine fixation. Midline trachea. Cardiomegaly  accentuated by AP portable technique. No pleural effusion or pneumothorax. Right lung is clear. The left lung base is suboptimally evaluated secondary to overlying external pacer/ defibrillator. No congestive failure.  IMPRESSION: Cardiomegaly without congestive failure.  Suboptimal evaluation of the left lung base, secondary to overlying support apparatus. Otherwise, no evidence of aspiration.  Endotracheal tube appropriately positioned.   Electronically Signed   By: Jeronimo Greaves M.D.   On: 02/03/2015 14:55     ASSESSMENT / PLAN:  PULMONARY ETT 8/18 >8/28, 9/13 >>> A: Acute respiratory failure in setting seizure vs aspiration OSA ? PE essentailly ruled out with neg DVT and low prob VQ prior in admit  P:   Full vent support. Adjustements made for the resp alkalosis. Reduce TV to 7cc/kg and rate to 20.  Wean sedation SBT when more awake.   CARDIOVASCULAR Rt femoral CVL 8/18 >>8/20 Lt femoral A line 8/18 >>8/28 RIJ HD 8/20 >? LIJ 8/20 >? RIJ perm cath >>>  A: Hypertenson Elevated lactic acid > suspect due to siezure Shock > Resolved. Likely RV failure with possible PE.-although venous doppler neg for DVT  X Cath 8/06/25/14 OM branch occlusion per report and unlikely cause of admission RV failure AFRVR - resolved  P:  Telemetry monitoring Holding hydralazine, nebivolol. Restart amlodipine.  Trend troponin  RENAL A: ESRD new onset . Was on CRRT now intermittent HD dependent, - RF and pan autoimmune negative 01/14/15  P:   Nephrology following Follow Bmet Replace electrolytes as indicated.   GASTROINTESTINAL A: Acute abdominal pain s/p laparotomy with wound vac 8/18 >wound vac change 8/19 d/t eviceration>to OR 8/21 with wound closure Shock Liver > resolved  P:  NPO NG tube to suction as he has high residuals. Abd X ray. 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 Will continue to monitor.  INFECTIOUS Culture - negative HIV negative 01/14/15 A: Sepsis with presumed abdominal source.>exp lap was neg .   P:   Day 7 vanc completed 01/14/15 Day 10  Zosyn to be stopped 01/17/15  ENDOCRINE A: Hyperglycemia   P:   CBG and SSI  NEUROLOGIC A: Status epilepticus PRES syndrome. Acute encephalopathy. Metabolic + seizure component Hx  chronic back pain, anxiety, depression.  P:   RASS goal: -1 On Keppra and Vimpat Seizure treatment per Neuro MRI brain. Holding outpt xanax, celexa, oxycodone, lyrica  Updates: wife updated 9/14  Chilton Greathouse MD [facility] Pager [phone] If no answer or after 3pm call: [phone] 02/04/2015, 10:45 AM

## 2015-02-04 NOTE — Progress Notes (Signed)
Nutrition Follow-up  DOCUMENTATION CODES:   Obesity unspecified  INTERVENTION:    Recommend start TF within next 24 hours if unable to extubate; if unable to use gut for nutrition consider TPN.  Recommend Nepro at 15 ml/h with Prostat 60 ml 5 times per day to provide 1648 kcals, 179 gm protein, 262 ml free water daily.  NUTRITION DIAGNOSIS:   Inadequate oral intake related to inability to eat as evidenced by NPO status.  Ongoing  GOAL:   Provide needs based on ASPEN/SCCM guidelines  Unmet  MONITOR:   Vent status, Labs, Weight trends, I & O's  REASON FOR ASSESSMENT:   Ventilator    ASSESSMENT:   59 yo male admitted on 8/18 with severe abd pain, found unresponsive at home. S/P ex lap. Transferred back to ICU on 9/13 after seizures, PRES syndrome. Intubated for resp failure.   Patient has been requiring HD on the floor. Before he was transferred back to ICU, he was on a renal diet with variable intake, 0-100% of meals. Earlier in admission, he required TPN for prolonged ileus.   Patient is currently intubated on ventilator support Temp (24hrs), Avg:99.9 F (37.7 C), Min:99.2 F (37.3 C), Max:101.1 F (38.4 C)  Propofol: 47.6 ml/hr providing 1257 kcals per day.  Discussed patient in ICU rounds and with RN today. Patient not ready to start enteral nutrition at this time, vomited this AM, NGT now to intermittent suction.   Diet Order:  Diet NPO time specified  Skin:  Reviewed, no issues  Last BM:  unknown  Height:   Ht Readings from Last 1 Encounters:  01/05/15 6' 2.5" (1.892 m)    Weight:   Wt Readings from Last 1 Encounters:  02/04/15 276 lb 10.8 oz (125.5 kg)   01/27/15 271 lb 13.2 oz (123.3 kg)       01/19/15 292 lb 8.8 oz (132.7 kg)  08/25/16275 lb 12.7 oz (125.1 kg) 08/20/16305 lb 5.4 oz (138.5 kg)      Ideal Body Weight:  87.7 kg  BMI:  Body mass index is 35.06 kg/(m^2).  Estimated Nutritional Needs:   Kcal:   1380-1757  Protein:  >/= 175 gm  Fluid:  as renal function allows  EDUCATION NEEDS:   No education needs identified at this time  Joaquin Courts, RD, LDN, CNSC Pager [phone] After Hours Pager [phone]

## 2015-02-05 ENCOUNTER — Inpatient Hospital Stay (HOSPITAL_COMMUNITY): Payer: Medicare Other

## 2015-02-05 DIAGNOSIS — R569 Unspecified convulsions: Secondary | ICD-10-CM | POA: Diagnosis not present

## 2015-02-05 LAB — BLOOD GAS, ARTERIAL
Acid-Base Excess: 1.3 mmol/L (ref 0.0–2.0)
Bicarbonate: 24.4 mEq/L — ABNORMAL HIGH (ref 20.0–24.0)
DRAWN BY: 441381
FIO2: 50
MECHVT: 580 mL
O2 SAT: 97.9 %
PATIENT TEMPERATURE: 98.6
PCO2 ART: 32.7 mmHg — AB (ref 35.0–45.0)
PEEP/CPAP: 5 cmH2O
PH ART: 7.486 — AB (ref 7.350–7.450)
RATE: 20 resp/min
TCO2: 25.4 mmol/L (ref 0–100)
pO2, Arterial: 102 mmHg — ABNORMAL HIGH (ref 80.0–100.0)

## 2015-02-05 LAB — CBC WITH DIFFERENTIAL/PLATELET
BASOS ABS: 0 10*3/uL (ref 0.0–0.1)
Basophils Relative: 0 %
EOS PCT: 1 %
Eosinophils Absolute: 0.1 10*3/uL (ref 0.0–0.7)
HCT: 26.6 % — ABNORMAL LOW (ref 39.0–52.0)
Hemoglobin: 8.7 g/dL — ABNORMAL LOW (ref 13.0–17.0)
LYMPHS PCT: 7 %
Lymphs Abs: 0.7 10*3/uL (ref 0.7–4.0)
MCH: 29.3 pg (ref 26.0–34.0)
MCHC: 32.7 g/dL (ref 30.0–36.0)
MCV: 89.6 fL (ref 78.0–100.0)
MONO ABS: 0.8 10*3/uL (ref 0.1–1.0)
MONOS PCT: 8 %
Neutro Abs: 8.3 10*3/uL — ABNORMAL HIGH (ref 1.7–7.7)
Neutrophils Relative %: 85 %
PLATELETS: 135 10*3/uL — AB (ref 150–400)
RBC: 2.97 MIL/uL — ABNORMAL LOW (ref 4.22–5.81)
RDW: 15.4 % (ref 11.5–15.5)
WBC: 9.9 10*3/uL (ref 4.0–10.5)

## 2015-02-05 LAB — RENAL FUNCTION PANEL
ALBUMIN: 2 g/dL — AB (ref 3.5–5.0)
Anion gap: 13 (ref 5–15)
BUN: 52 mg/dL — AB (ref 6–20)
CO2: 25 mmol/L (ref 22–32)
Calcium: 7.9 mg/dL — ABNORMAL LOW (ref 8.9–10.3)
Chloride: 101 mmol/L (ref 101–111)
Creatinine, Ser: 9.55 mg/dL — ABNORMAL HIGH (ref 0.61–1.24)
GFR calc Af Amer: 6 mL/min — ABNORMAL LOW (ref >60)
GFR calc non Af Amer: 5 mL/min — ABNORMAL LOW (ref >60)
GLUCOSE: 92 mg/dL (ref 65–99)
PHOSPHORUS: 6.8 mg/dL — AB (ref 2.5–4.6)
POTASSIUM: 3.9 mmol/L (ref 3.5–5.1)
SODIUM: 139 mmol/L (ref 135–145)

## 2015-02-05 LAB — PROCALCITONIN: Procalcitonin: 25.19 ng/mL

## 2015-02-05 LAB — GLUCOSE, CAPILLARY
GLUCOSE-CAPILLARY: 86 mg/dL (ref 65–99)
GLUCOSE-CAPILLARY: 90 mg/dL (ref 65–99)
Glucose-Capillary: 90 mg/dL (ref 65–99)
Glucose-Capillary: 89 mg/dL (ref 65–99)
Glucose-Capillary: 85 mg/dL (ref 65–99)
Glucose-Capillary: 80 mg/dL (ref 65–99)

## 2015-02-05 LAB — MAGNESIUM: Magnesium: 2.4 mg/dL (ref 1.7–2.4)

## 2015-02-05 NOTE — Progress Notes (Signed)
LTM EEG D/C'd per Dr Roseanne Reno. No skin breakdown noted.

## 2015-02-05 NOTE — Progress Notes (Signed)
PT Cancellation Note  Patient Details Name: Jared [name] MRN: [id number] DOB: 1955/10/20   Cancelled Treatment:    Reason Eval/Treat Not Completed: Medical issues which prohibited therapy. Discussed pt case with RN who states that pt is still hooked up to EEG in room. After he is disconnected is to go for MRI and then will be on HD. Will hold PT treatment today and check back as schedule allows for readiness to participate.    Conni Slipper 02/05/2015, 10:42 AM   Conni Slipper, PT, DPT Acute Rehabilitation Services Pager: [phone]

## 2015-02-05 NOTE — Progress Notes (Deleted)
eLink Physician-Brief Progress Note Patient Name: CRISTIN THAKER DOB: 1955-09-18 MRN: [id number]   Date of Service  02/05/2015  HPI/Events of Note  Severe diarrhea p rectal sup  Notes reviewed and concern re ileus  eICU Interventions  No role for anti diarrheal      Intervention Category Minor Interventions: Routine modifications to care plan (e.g. PRN medications for pain, fever)  Sandrea Hughs 02/05/2015, 4:02 PM

## 2015-02-05 NOTE — Progress Notes (Signed)
Patient ID: Jared [name], male   DOB: March 01, 1956, 59 y.o.   MRN: [id number]  [facility] Progress Note    Assessment/ Plan:   1. 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 Good urine output but no evident renal recovery-plan for hemodialysis today (previously on a Tuesday/Thursday/Saturday schedule) via right IJ tunneled dialysis catheter. Upper extremity vein mapping significant for bilateral thrombosed cephalic veins-limiting permanent hemodialysis access options. 2. SIRS- clinical suspected PE (RA and RV dilated, RV hypocontractile on ECHO) for which she was transiently on anticoagulation - VQ low prob so heparin stopped 3. CAD- cath revealed occluded OM, ongoing medical management 4. S/p exploratory lap and fascia closure now s/p wound vac - per CCS/wound care 5. Nutrition: off TPN, taking some PO, continue oral nutritional supplementation 6. Anemia- hemoglobin improving status post PRBCs and Aranesp/intravenous iron 7. CKD/MBD- currently intubated/nothing by mouth-binders on hold. PTH level acceptable and not on the SVC/RA. 8. Seizure episode/acute respiratory failure: EEG done-report reviewed. Remains on ventilator support  Subjective:   No acute events overnight-no further seizure episodes noted.    Objective:   BP 164/80 mmHg  Pulse 69  Temp(Src) 98.5 F (36.9 C) (Oral)  Resp 12  Wt 123.3 kg (271 lb 13.2 oz)  SpO2 97%  Intake/Output Summary (Last 24 hours) at 02/05/15 0801 Last data filed at 02/05/15 0700  Gross per 24 hour  Intake 1667.43 ml  Output   2165 ml  Net -497.57 ml   Weight change: -2.7 kg (-5 lb 15.2 oz)  Physical Exam: Gen: Intubated, sedated (on propofol) CVS: Pulse regular in rate and rhythm, S1 and S2  normal Resp: Decreased breath sounds over bases otherwise clear Abd: Soft, obese, abdominal binder in situ Ext: Trace lower extremity edema  Imaging: Ct Head Wo Contrast  02/03/2015   CLINICAL DATA:  59 year old hypertensive male with seizure on ventilator. Initial encounter.  EXAM: CT HEAD WITHOUT CONTRAST  TECHNIQUE: Contiguous axial images were obtained from the base of the skull through the vertex without intravenous contrast.  COMPARISON:  05/04/2005.  FINDINGS: Relatively symmetric hypodensity occipital lobes extending into the parietal lobes in a distribution consistent with posterior reversible encephalopathy syndrome.  No intracranial hemorrhage.  No CT evidence of acute large thrombotic infarct separate from above described findings.  No intracranial mass lesion noted on this unenhanced exam.  No hydrocephalus.  Partial opacification mastoid air cells. No obvious eustachian tube obstructing lesion. Pooling of secretions posterior superior nasopharynx.  Exophthalmos.  Vascular calcifications.  IMPRESSION: Findings suggestive of posterior reversible encephalopathy syndrome as noted above.  These results will be called to the ordering clinician or representative by the Radiologist Assistant, and communication documented in the PACS or zVision Dashboard.   Electronically Signed   By: Lacy Duverney M.D.   On: 02/03/2015 17:35   Dg Chest Port 1 View  02/05/2015   CLINICAL DATA:  Respiratory failure.  EXAM: PORTABLE CHEST - 1 VIEW  COMPARISON:  02/03/2015.  FINDINGS: Endotracheal tube, NG tube, large caliber right IJ line in stable position. Cardiomegaly with diffuse bilateral pulmonary alveolar infiltrates and pleural effusions consistent with congestive heart failure. Underlying pneumonia cannot be excluded. No pneumothorax.  IMPRESSION: 1. Lines and tubes in stable position . 2. Cardiomegaly with diffuse bilateral pulmonary alveolar infiltrates  and pleural effusions consistent with congestive heart  failure.   Electronically Signed   By: Maisie Fus  Register   On: 02/05/2015 07:36   Dg Chest Port 1 View  02/03/2015   CLINICAL DATA:  Endotracheal and orogastric tubes. Seizure. Question aspiration.  EXAM: PORTABLE CHEST - 1 VIEW  COMPARISON:  01/29/2015  FINDINGS: Endotracheal tube terminates 7.5 cm above carina. Orogastric tube extends beyond the inferior aspect of the film. Right internal jugular line terminates at the mid right atrium. Cervical spine fixation. Midline trachea. Cardiomegaly accentuated by AP portable technique. No pleural effusion or pneumothorax. Right lung is clear. The left lung base is suboptimally evaluated secondary to overlying external pacer/ defibrillator. No congestive failure.  IMPRESSION: Cardiomegaly without congestive failure.  Suboptimal evaluation of the left lung base, secondary to overlying support apparatus. Otherwise, no evidence of aspiration.  Endotracheal tube appropriately positioned.   Electronically Signed   By: Jeronimo Greaves M.D.   On: 02/03/2015 14:55   Dg Abd Portable 1v  02/04/2015   CLINICAL DATA:  Exploratory laparotomy with abdominal distention  EXAM: PORTABLE ABDOMEN - 1 VIEW  COMPARISON:  01/24/2015  FINDINGS: No dilated gas-filled bowel. Nasogastric tube tip is at the proximal stomach. High-density material scattered around the colon. Stool volume is within normal limits. No concerning intra-abdominal mass effect or calcification.  IMPRESSION: Negative for ileus or obstruction.   Electronically Signed   By: Marnee Spring M.D.   On: 02/04/2015 12:09    Labs: BMET  Recent Labs Lab 01/30/15 0457 01/31/15 0529 02/01/15 0616 02/02/15 0406 02/03/15 0448 02/03/15 1415 02/03/15 1610 02/04/15 0238 02/05/15 0250  NA 136 138 138 136 138 QUESTIONABLE RESULTS, RECOMMEND RECOLLECT TO VERIFY 137 140 139  K 4.3 3.8 4.1 3.9 4.0  --  4.6 4.0 3.9  CL 100* 99* 100* 98* 102  --  101 103 101  CO2 24 29 26 27 26   --  19* 25 25  GLUCOSE 93 93 91 96 97  --   162* 114* 92  BUN 67* 39* 51* 58* 62*  --  67* 46* 52*  CREATININE 14.94* 10.29* 11.59* 12.21* 12.45*  --  13.21* 8.49* 9.55*  CALCIUM 8.1* 8.1* 8.1* 7.9* 8.2*  --  8.1* 8.2* 7.9*  PHOS 6.4* 5.1* 5.7* 6.1* 6.2*  --   --  3.3 6.8*   CBC  Recent Labs Lab 02/03/15 1415 02/03/15 1610 02/04/15 0238 02/05/15 0250  WBC 7.2 20.9* 11.3* 9.9  NEUTROABS  --   --  10.2* 8.3*  HGB 3.5* 10.4* 9.8* 8.7*  HCT 11.7* 31.1* 29.2* 26.6*  MCV 96.7 87.9 87.2 89.6  PLT 112* 305 238 135*   Medications:    . amLODipine  10 mg Oral Daily  . antiseptic oral rinse  7 mL Mouth Rinse QID  . aspirin  81 mg Per Tube Daily  . chlorhexidine gluconate  15 mL Mouth Rinse BID  . darbepoetin (ARANESP) injection - DIALYSIS  100 mcg Intravenous Q Mon-HD  . feeding supplement (NEPRO CARB STEADY)  237 mL Oral BID BM  . heparin subcutaneous  5,000 Units Subcutaneous 3 times per day  . lacosamide (VIMPAT) IV  100 mg Intravenous Q12H  . lanthanum  500 mg Oral TID WC  . levETIRAcetam  1,000 mg Intravenous Q12H  . multivitamin with minerals  1 tablet Oral Daily  . pantoprazole sodium  40 mg Per Tube Daily  . sodium chloride  10 mL Intravenous Q12H   Zetta Bills, MD 02/05/2015, 8:01  AM

## 2015-02-05 NOTE — Progress Notes (Signed)
Subjective: No recurrence of seizure activity reported. Patient is still on propofol drip but is arousable. He has continued to show signs of agitation and has required restraints with mittens.  Objective: Current vital signs: BP 164/80 mmHg  Pulse 69  Temp(Src) 99.5 F (37.5 C) (Oral)  Resp 12  Wt 123.3 kg (271 lb 13.2 oz)  SpO2 97%  Neurologic Exam: Patient remains intubated and on mechanical ventilation. He has spontaneous respirations. He was arousable and was able to visually track movement. He was also able to follow commands. His extraocular movements were full and conjugate. Face was symmetrical. Muscle tone was flaccid throughout. He was able to move extremities equally.  Long-term EEG recording analysis is pending. No seizure activity was seen on EEG recording this morning.  Medications: I have reviewed the patient's current medications.  Assessment/Plan: 59 year old man with complicated clinical course with sepsis and respiratory failure and new onset seizure activity on 02/03/2015. CT scan showed findings of possible PRES. Seizures are controlled at this point with a combination of Keppra and Vimpat, as well as propofol drip. MRI of the brain is pending.  EEG recording discontinued. Recommend no change in current seizure management with Keppra and Vimpat. Continue to wean propofol as tolerated.  C.R. Roseanne Reno, MD [facility] [phone]  02/05/2015  9:28 AM

## 2015-02-05 NOTE — Progress Notes (Signed)
PULMONARY / CRITICAL CARE MEDICINE   Name: Jared [name] MRN: [id number] DOB: Jan 03, 1956    ADMISSION DATE:  01/08/2015 CONSULTATION DATE:  8/18  REFERRING MD :  EDP Dr Criss Alvine  CHIEF COMPLAINT:  Found down  BRIEF DESCRIPTION:   59 yo male had severe abdominal pain, and then found unresponsive at home.  In ER he was in shock, cyanotic, with lactic acidosis, and hyperkalemia. Initial suspicion was for PE, however VQ was low prob. Etiology for such acute deterioration remains unclear, but is presumed to be occult septic shock by exclusion of PE, MI, bowel ischemia. Has been recovering, however has required continued dialysis. 9/13 suffered respiratory arrest and seizures requiring intubation and ICU transfer. Transferred to the floor. Admit back to ICU on 9/13 after seizures, PRES syndrome. Intubated for resp failure.   STUDIES:  8/18 Echo > EF 45 to 50%, mod RV dilation, mod/severe RV dysfx, PAS 47 mmHg 8/18 Renal u/s >> b/l renal echogenicity 8/23 LHC - left circumflex, second obtuse marginal branch 100% occlusion   SIGNIFICANT EVENTS: 8/18 Admit, CCS, cardiology consulted; brief PEA arrest; laparotomy with placement of wound vac; started heparin gtt for presumed PE 8/19 Renal consulted. Duplex LE negative for DVT 8/20 CRRT  8/21 OR for abd wound closure  8/23  Ileus, vanc stopped 8/26  Tolerating intemittent HD - oliguric. Vanc Stopped 8/27  hypertensive - on hydralazine prn. Anuric. Being weaned off dilaudid.  8/28  Extubated  TPN.  9/5 to tele 9/13 Seizure, resp arrest > intubated > to ICU   SUBJECTIVE/OVERNIGHT/INTERVAL HX 59 year old male with description as above. He was had been slowly recovering to the degree that he was transferred to telemetry floor 9/5. Since that time he had still been requiring HD, and has give little hope for renal recovery. He was doing well and even ambulating with his family on the unit. Most of his acute issues for his hospitalization had mostly  resolved or were close to resolving. 9/12 he developed headache and had gotten some phenergan, Toradol. and benadryl. Also got IV dilaudid a little later. 9/13 patient has drowsy, and family reports that he was complaining of nausea, headache, and face tingling. He then had a witnessed grand mal seizure. Rapid response was called and upon the RN's arrival he was seizing with dilated pupils and R lateral gaze preference. He was in acute respiratory distress with pale, diaphoretic skin. Breath sounds were coarse with frothy white secretions. He was hypertensive. He was given supplemental O2. He then had another seizure and bit tongue. He was given ativan 2mg , code blue was called. He never lost pulses, or relative hemodynamic stability.  And he was eventually intubated by CRNA. He was transferred to ICU. Once RSI meds wore off he began seizing again, which was refractory to an additional 8 mg of versed. He was started on propofol and clinical seizures seemingly have stopped. PCCM to assume care.   VITAL SIGNS: Temp:  [98.3 F (36.8 C)-99.5 F (37.5 C)] 99.5 F (37.5 C) (09/15 0830) Pulse Rate:  [51-79] 70 (09/15 1200) Resp:  [6-25] 20 (09/15 1200) BP: (132-164)/(63-82) 152/76 mmHg (09/15 1200) SpO2:  [97 %-100 %] 100 % (09/15 1200) FiO2 (%):  [40 %-50 %] 40 % (09/15 1115) Weight:  [271 lb 13.2 oz (123.3 kg)] 271 lb 13.2 oz (123.3 kg) (09/15 0500) HEMODYNAMICS:   VENTILATOR SETTINGS: Vent Mode:  [-] PSV;CPAP FiO2 (%):  [40 %-50 %] 40 % Set Rate:  [20 bmp] 20  bmp Vt Set:  [58 mL-580 mL] 580 mL PEEP:  [5 cmH20] 5 cmH20 Pressure Support:  [8 cmH20] 8 cmH20 Plateau Pressure:  [17 cmH20] 17 cmH20 INTAKE / OUTPUT:  Intake/Output Summary (Last 24 hours) at 02/05/15 1314 Last data filed at 02/05/15 1300  Gross per 24 hour  Intake 1893.42 ml  Output   2670 ml  Net -776.58 ml   Total I/O In: 603.9 [I.V.:328.9; NG/GT:130; IV Piggyback:145] Out: 725 [Urine:675; Emesis/NG output:50]  I/O last 3  completed shifts: In: 2837.9 [I.V.:2302.9; NG/GT:100; IV Piggyback:435] Out: 4117 [Urine:2165; Emesis/NG output:950; Other:1002]  PHYSICAL EXAMINATION: General: Awake, responsive. On vent. Neuro: No gross focal deficits.  HEENT: Moist mucus membranes. No JVD. Cardiovascular: RRR, no MRG. Lungs: Coarse breath sounds. Abdomen: Obese, soft, surgical dressing in place.  Musculoskeletal: No acute deformity.  LABS:  PULMONARY  Recent Labs Lab 02/03/15 1425 02/03/15 1748 02/04/15 1315 02/05/15 0420  PHART 7.069* 7.565* 7.453* 7.486*  PCO2ART 36.2 26.4* 37.5 32.7*  PO2ART 141.0* 82.0 57.0* 102*  HCO3 10.4* 23.9 26.2* 24.4*  TCO2 25.4  O2SAT 98.0 98.0 90.0 97.9    CBC  Recent Labs Lab 02/03/15 1610 02/04/15 0238 02/05/15 0250  HGB 10.4* 9.8* 8.7*  HCT 31.1* 29.2* 26.6*  WBC 20.9* 11.3* 9.9  PLT 305 238 135*   COAGULATION No results for input(s): INR in the last 168 hours.  CARDIAC    Recent Labs Lab 02/03/15 1610 02/03/15 2010 02/04/15 0238  TROPONINI 1.12* 1.99* 1.54*   No results for input(s): PROBNP in the last 168 hours.  CHEMISTRY  Recent Labs Lab 02/01/15 0616 02/02/15 0406 02/03/15 0448 02/03/15 1415 02/03/15 1610 02/04/15 0238 02/05/15 0250  NA 138 136 138 QUESTIONABLE RESULTS, RECOMMEND RECOLLECT TO VERIFY 137 140 139  K 4.1 3.9 4.0  --  4.6 4.0 3.9  CL 100* 98* 102  --  101 103 101  CO2 --  19* 25 25  GLUCOSE 91 96 97  --  162* 114* 92  BUN 51* 58* 62*  --  67* 46* 52*  CREATININE 11.59* 12.21* 12.45*  --  13.21* 8.49* 9.55*  CALCIUM 8.1* 7.9* 8.2*  --  8.1* 8.2* 7.9*  MG  --   --   --   --   --  2.2 2.4  PHOS 5.7* 6.1* 6.2*  --   --  3.3 6.8*   Estimated Creatinine Clearance: 11.4 mL/min (by C-G formula based on Cr of 9.55).  LIVER  Recent Labs Lab 02/02/15 0406 02/03/15 0448 02/03/15 1610 02/04/15 0238 02/05/15 0250  AST  --   --  48*  --   --   ALT  --   --  12*  --   --   ALKPHOS  --   --  108  --    --   BILITOT  --   --  1.0  --   --   PROT  --   --  5.6*  --   --   ALBUMIN 2.0* 2.1* 2.1* 2.1* 2.0*   INFECTIOUS  Recent Labs Lab 02/03/15 2010 02/04/15 0238 02/04/15 0846 02/05/15 0250  LATICACIDVEN 1.4 0.9 1.2  --   PROCALCITON 11.75 21.41  --  25.19   ENDOCRINE CBG (last 3)   Recent Labs  02/05/15 0340 02/05/15 0807 02/05/15 1151  GLUCAP 86 90 89   IMAGING   ASSESSMENT / PLAN:  PULMONARY ETT 8/18 >8/28, 9/13 >>> A: Acute respiratory failure in  setting seizure vs aspiration OSA ? PE essentailly ruled out with neg DVT and low prob VQ prior in admit  P:   Full vent support. Still alkalotic on ABG.  Wean sedation SBT today after MRI and dialysis  CARDIOVASCULAR Rt femoral CVL 8/18 >>8/20 Lt femoral A line 8/18 >>8/28 RIJ HD 8/20 >? LIJ 8/20 >? RIJ perm cath >>>  A:  Hypertenson Elevated lactic acid > suspect due to siezure Shock > Resolved. Likely RV failure with possible PE.-although venous doppler neg for DVT  X Cath 8/06/25/14 OM branch occlusion per report and unlikely cause of admission RV failure AFRVR - resolved  P:  Telemetry monitoring Holding hydralazine, nebivolol. On amlodipine.  Trend troponin  RENAL A: ESRD new onset . Was on CRRT now intermittent HD dependent, - RF and pan autoimmune negative 01/14/15  P:   Nephrology following HD today with removal of fluids.    GASTROINTESTINAL A: Acute abdominal pain s/p laparotomy with wound vac 8/18 >wound vac change 8/19 d/t eviceration>to OR 8/21 with wound closure Shock Liver > resolved  P:  NPO NG tube to suction as he has high residuals. Abd X ray. 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 Will continue to  monitor.  INFECTIOUS Culture - negative HIV negative 01/14/15 A: Sepsis with presumed abdominal source.>exp lap was neg .   P:   Day 7 vanc completed 01/14/15 Day 10  Zosyn to be stopped 01/17/15  ENDOCRINE A: Hyperglycemia   P:   CBG and SSI  NEUROLOGIC A: Status epilepticus PRES syndrome. Acute encephalopathy. Metabolic + seizure component Hx  chronic back pain, anxiety, depression.  P:   RASS goal: -1 On Keppra and Vimpat Seizure treatment per Neuro MRI brain. Holding outpt xanax, celexa, oxycodone, lyrica  Updates: wife updated 9/14  Chilton Greathouse MD  Pulmonary and Critical Care Pager [phone] If no answer or after 3pm call: [phone] 02/05/2015, 1:14 PM

## 2015-02-06 ENCOUNTER — Inpatient Hospital Stay (HOSPITAL_COMMUNITY): Payer: Medicare Other

## 2015-02-06 LAB — BASIC METABOLIC PANEL
Anion gap: 10 (ref 5–15)
BUN: 34 mg/dL — ABNORMAL HIGH (ref 6–20)
CHLORIDE: 103 mmol/L (ref 101–111)
CO2: 26 mmol/L (ref 22–32)
CREATININE: 6.4 mg/dL — AB (ref 0.61–1.24)
Calcium: 7.7 mg/dL — ABNORMAL LOW (ref 8.9–10.3)
GFR, EST AFRICAN AMERICAN: 10 mL/min — AB (ref >60)
GFR, EST NON AFRICAN AMERICAN: 8 mL/min — AB (ref >60)
Glucose, Bld: 83 mg/dL (ref 65–99)
POTASSIUM: 3.9 mmol/L (ref 3.5–5.1)
SODIUM: 139 mmol/L (ref 135–145)

## 2015-02-06 LAB — RENAL FUNCTION PANEL
ALBUMIN: 2.1 g/dL — AB (ref 3.5–5.0)
ANION GAP: 12 (ref 5–15)
BUN: 34 mg/dL — ABNORMAL HIGH (ref 6–20)
CALCIUM: 7.6 mg/dL — AB (ref 8.9–10.3)
CO2: 25 mmol/L (ref 22–32)
Chloride: 101 mmol/L (ref 101–111)
Creatinine, Ser: 6.34 mg/dL — ABNORMAL HIGH (ref 0.61–1.24)
GFR, EST AFRICAN AMERICAN: 10 mL/min — AB (ref >60)
GFR, EST NON AFRICAN AMERICAN: 8 mL/min — AB (ref >60)
Glucose, Bld: 82 mg/dL (ref 65–99)
PHOSPHORUS: 4.9 mg/dL — AB (ref 2.5–4.6)
Potassium: 3.9 mmol/L (ref 3.5–5.1)
SODIUM: 138 mmol/L (ref 135–145)

## 2015-02-06 LAB — CBC WITH DIFFERENTIAL/PLATELET
BASOS ABS: 0.1 10*3/uL (ref 0.0–0.1)
BASOS PCT: 1 %
EOS ABS: 0.2 10*3/uL (ref 0.0–0.7)
Eosinophils Relative: 3 %
HEMATOCRIT: 27.2 % — AB (ref 39.0–52.0)
HEMOGLOBIN: 8.8 g/dL — AB (ref 13.0–17.0)
Lymphocytes Relative: 8 %
Lymphs Abs: 0.7 10*3/uL (ref 0.7–4.0)
MCH: 29.3 pg (ref 26.0–34.0)
MCHC: 32.4 g/dL (ref 30.0–36.0)
MCV: 90.7 fL (ref 78.0–100.0)
Monocytes Absolute: 0.5 10*3/uL (ref 0.1–1.0)
Monocytes Relative: 6 %
NEUTROS ABS: 7.2 10*3/uL (ref 1.7–7.7)
NEUTROS PCT: 82 %
Platelets: 105 10*3/uL — ABNORMAL LOW (ref 150–400)
RBC: 3 MIL/uL — ABNORMAL LOW (ref 4.22–5.81)
RDW: 15.1 % (ref 11.5–15.5)
WBC: 8.8 10*3/uL (ref 4.0–10.5)

## 2015-02-06 LAB — GLUCOSE, CAPILLARY
GLUCOSE-CAPILLARY: 78 mg/dL (ref 65–99)
GLUCOSE-CAPILLARY: 83 mg/dL (ref 65–99)
GLUCOSE-CAPILLARY: 85 mg/dL (ref 65–99)
GLUCOSE-CAPILLARY: 86 mg/dL (ref 65–99)
GLUCOSE-CAPILLARY: 91 mg/dL (ref 65–99)
Glucose-Capillary: 79 mg/dL (ref 65–99)

## 2015-02-06 LAB — MAGNESIUM: MAGNESIUM: 2.1 mg/dL (ref 1.7–2.4)

## 2015-02-06 MED ORDER — NEPRO/CARBSTEADY PO LIQD
1000.0000 mL | ORAL | Status: DC
  Administered 2015-02-06 – 2015-02-07 (×2): 1000 mL via ORAL
  Filled 2015-02-06 (×4): qty 1000

## 2015-02-06 MED ORDER — NEBIVOLOL HCL 5 MG PO TABS
5.0000 mg | ORAL_TABLET | Freq: Every day | ORAL | Status: DC
  Administered 2015-02-06 – 2015-02-11 (×5): 5 mg via ORAL
  Filled 2015-02-06 (×6): qty 1

## 2015-02-06 MED ORDER — PRO-STAT SUGAR FREE PO LIQD
60.0000 mL | Freq: Every day | ORAL | Status: DC
  Administered 2015-02-06 – 2015-02-07 (×5): 60 mL
  Filled 2015-02-06 (×9): qty 60

## 2015-02-06 MED ORDER — SODIUM CHLORIDE 0.9 % IV SOLN
75.0000 ug/h | INTRAVENOUS | Status: DC
  Administered 2015-02-06: 75 ug/h via INTRAVENOUS
  Filled 2015-02-06: qty 50

## 2015-02-06 MED ORDER — FENTANYL BOLUS VIA INFUSION
25.0000 ug | INTRAVENOUS | Status: DC | PRN
  Administered 2015-02-06 – 2015-02-07 (×5): 100 ug via INTRAVENOUS
  Filled 2015-02-06: qty 100

## 2015-02-06 NOTE — Progress Notes (Signed)
Patient ID: Jared [name], male   DOB: 20-Jul-1955, 59 y.o.   MRN: [id number]  [facility] Progress Note    Assessment/ Plan:   1. 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 Good urine output but no evident renal continue hemodialysis on a Tuesday/Thursday/Saturday schedule via right IJ tunneled dialysis catheter. Permanent access placement deferred after patient decompensated following GTC seizure/respiratory failure. 2. SIRS- previously on broad-spectrum antibiotics as well as empiric management for PE-this discontinued after negative data/low likelihood on VQ scan 3. CAD- cath revealed occluded OM, ongoing medical management 4. S/p exploratory lap and fascia closure now s/p wound vac - per CCS/wound care 5. Nutrition: off TPN, taking some PO, continue oral nutritional supplementation 6. Anemia- hemoglobin stable status post PRBCs and Aranesp/intravenous iron 7. CKD/MBD- currently intubated/nothing by mouth-binders on hold. PTH level acceptable and not on the SVC/RA. 8. Seizure episode/acute respiratory failure: EEG done-report reviewed. Remains on ventilator support  Subjective:    No acute events noted overnight-high NG tube output    Objective:   BP 147/76 mmHg  Pulse 53  Temp(Src) 98.6 F (37 C) (Oral)  Resp 20  Wt 119.5 kg (263 lb 7.2 oz)  SpO2 100%  Intake/Output Summary (Last 24 hours) at 02/06/15 0757 Last data filed at 02/06/15 0700  Gross per 24 hour  Intake 1958.33 ml  Output   4255 ml  Net -2296.67 ml   Weight change: -2.8 kg (-6 lb 2.8 oz)  Physical Exam: Gen: Intubated, appears to awaken when calling out his name CVS: Pulse regular in rate and rhythm, S1 and S2 normal Resp: Decreased breath sounds over bases otherwise  clear Abd: Soft, obese, abdominal binder in situ Ext: Trace lower extremity edema  Imaging: Mr Brain Wo Contrast  02/05/2015   CLINICAL DATA:  Seizures.  Abnormal CT scan.  Contrast could not be administered secondary to a renal failure.  EXAM: MRI HEAD WITHOUT CONTRAST  TECHNIQUE: Multiplanar, multiecho pulse sequences of the brain and surrounding structures were obtained without intravenous contrast.  COMPARISON:  CT head without contrast 02/03/2015.  FINDINGS: Subcortical T2 changes in the occipital and parietal lobes bilaterally are compatible with the previously suggested diagnosis of posterior reversible encephalopathy syndrome. T2 changes are also noted within the left cerebellum. There is no restricted diffusion. Focal T2 changes are noted along the posterior limb of the left internal capsule and cortical spinal tracts.  No significant anterior white matter disease is present.  The basal ganglia and brainstem are within normal limits otherwise. The internal auditory canals are unremarkable.  Flow is present in the major intracranial arteries. The globes and orbits are intact minimal mucosal thickening is present in the anterior ethmoid air cells bilaterally.  Fluid is present in the mastoid air cells bilaterally. Fluid is also present in the oropharynx. This is likely secondary to the endotracheal tube.  IMPRESSION: 1. Subcortical T2 changes compatible with posterior reversible encephalopathy syndrome. No restricted diffusion is present. 2. Fluid in the nasopharynx and bilateral mastoid air cells likely secondary to intubation.   Electronically Signed   By: Marin Roberts M.D.   On: 02/05/2015 13:50   Dg Chest Port 1 View  02/05/2015   CLINICAL DATA:  Respiratory failure.  EXAM: PORTABLE CHEST - 1 VIEW  COMPARISON:  02/03/2015.  FINDINGS: Endotracheal tube, NG tube, large caliber right IJ line in stable position. Cardiomegaly with diffuse bilateral pulmonary alveolar infiltrates and pleural  effusions consistent with congestive heart failure. Underlying pneumonia cannot be excluded. No pneumothorax.  IMPRESSION: 1. Lines and tubes in stable position . 2. Cardiomegaly with diffuse bilateral pulmonary alveolar infiltrates and pleural effusions consistent with congestive heart failure.   Electronically Signed   By: Maisie Fus  Register   On: 02/05/2015 07:36   Dg Abd Portable 1v  02/04/2015   CLINICAL DATA:  Exploratory laparotomy with abdominal distention  EXAM: PORTABLE ABDOMEN - 1 VIEW  COMPARISON:  01/24/2015  FINDINGS: No dilated gas-filled bowel. Nasogastric tube tip is at the proximal stomach. High-density material scattered around the colon. Stool volume is within normal limits. No concerning intra-abdominal mass effect or calcification.  IMPRESSION: Negative for ileus or obstruction.   Electronically Signed   By: Marnee Spring M.D.   On: 02/04/2015 12:09    Labs: BMET  Recent Labs Lab 01/31/15 0529 02/01/15 0616 02/02/15 0406 02/03/15 0448 02/03/15 1415 02/03/15 1610 02/04/15 0238 02/05/15 0250 02/06/15 0236  NA 138 138 136 138 QUESTIONABLE RESULTS, RECOMMEND RECOLLECT TO VERIFY 137 140 139 139  138  K 3.8 4.1 3.9 4.0  --  4.6 4.0 3.9 3.9  3.9  CL 99* 100* 98* 102  --  101 103 101 103  101  CO2 --  19* GLUCOSE 93 91 96 97  --  162* 114* 92 83  82  BUN 39* 51* 58* 62*  --  67* 46* 52* 34*  34*  CREATININE 10.29* 11.59* 12.21* 12.45*  --  13.21* 8.49* 9.55* 6.40*  6.34*  CALCIUM 8.1* 8.1* 7.9* 8.2*  --  8.1* 8.2* 7.9* 7.7*  7.6*  PHOS 5.1* 5.7* 6.1* 6.2*  --   --  3.3 6.8* 4.9*   CBC  Recent Labs Lab 02/03/15 1610 02/04/15 0238 02/05/15 0250 02/06/15 0236  WBC 20.9* 11.3* 9.9 8.8  NEUTROABS  --  10.2* 8.3* 7.2  HGB 10.4* 9.8* 8.7* 8.8*  HCT 31.1* 29.2* 26.6* 27.2*  MCV 87.9 87.2 89.6 90.7  PLT 305 238 135* 105*   Medications:    . amLODipine  10 mg Oral Daily  . antiseptic oral rinse  7 mL Mouth Rinse QID  . aspirin   81 mg Per Tube Daily  . chlorhexidine gluconate  15 mL Mouth Rinse BID  . darbepoetin (ARANESP) injection - DIALYSIS  100 mcg Intravenous Q Mon-HD  . heparin subcutaneous  5,000 Units Subcutaneous 3 times per day  . lacosamide (VIMPAT) IV  100 mg Intravenous Q12H  . lanthanum  500 mg Oral TID WC  . levETIRAcetam  1,000 mg Intravenous Q12H  . multivitamin with minerals  1 tablet Oral Daily  . pantoprazole sodium  40 mg Per Tube Daily  . sodium chloride  10 mL Intravenous Q12H   Zetta Bills, MD 02/06/2015, 7:57 AM

## 2015-02-06 NOTE — Progress Notes (Signed)
Subjective: No recurrence of seizure activity reported. Patient remains on propofol drip for management of agitation in addition to restraints with mittens.  Objective: Current vital signs: BP 147/76 mmHg  Pulse 53  Temp(Src) 98.6 F (37 C) (Oral)  Resp 20  Wt 119.5 kg (263 lb 7.2 oz)  SpO2 100%  Neurologic Exam: Patient was arousable and was able to follow simple commands. He remains intubated and on mechanical ventilation, and also has respirations spontaneously. Pupils were equal and reacted normally to light. His extraocular movements were full and conjugate on right left lateral gaze. Face was symmetrical. Muscle tone was flaccid throughout at rest. He was able to move right and left extremities equally to command.  MRI on 02/05/2015 showed findings consistent with posterior reversible encephalopathy syndrome (PRES). Study was otherwise unremarkable.  Medications: I have reviewed the patient's current medications.  Assessment/Plan: 59 year old man with complicated course, including sepsis and respiratory failure, with new onset seizure activity and associated PRES. She is a controlled at this point with Keppra and Vimpat, and remains on propofol primarily for agitation. Long-term EEG recording was discontinued, as no further seizure activity was seen during his last 24 hours of EEG recording.  Recommend no changes in current management, including continuing Keppra and Vimpat at current doses and weaning propofol as tolerated.  We will continue to follow this patient with you.  C.R. Roseanne Reno, MD [facility] [phone]  02/06/2015  8:08 AM

## 2015-02-06 NOTE — Progress Notes (Signed)
Rehab Admissions Coordinator Note:  Patient was screened by Clois Dupes for appropriateness for an Inpatient Acute Rehab Consult per PT recommendation.  At this time, we are recommending await further progress before determining rehab venue options.. I will follow.  Clois Dupes 02/06/2015, 3:22 PM  I can be reached at [phone].

## 2015-02-06 NOTE — Progress Notes (Addendum)
Nutrition Follow-up  DOCUMENTATION CODES:   Obesity unspecified  INTERVENTION:    Initiate TF via OGT with Nepro at goal rate of 15 ml/h (360 ml per day) with Prostat 60 ml 5 times per day to provide 1648 kcals, 179 gm protein, 262 ml free water daily.  NUTRITION DIAGNOSIS:   Inadequate oral intake related to inability to eat as evidenced by NPO status.  Ongoing  GOAL:   Provide needs based on ASPEN/SCCM guidelines  Unmet  MONITOR:   Vent status, Labs, Weight trends, I & O's  REASON FOR ASSESSMENT:   Ventilator    ASSESSMENT:   59 yo male admitted on 8/18 with severe abd pain, found unresponsive at home. S/P ex lap. Transferred back to ICU on 9/13 after seizures, PRES syndrome. Intubated for resp failure.   Discussed patient in ICU rounds and with RN today. Patient remains intubated. Okay for RD to order TF. OGT in place. Requiring HD.   Patient is currently intubated on ventilator support Temp (24hrs), Avg:99.1 F (37.3 C), Min:98.3 F (36.8 C), Max:100 F (37.8 C)  Propofol: 60.8 ml/hr providing 1605 kcals per day. Will be unable to meet underfeeding nutrition goal with current propofol rate.   Diet Order:  Diet NPO time specified  Skin:  Reviewed, no issues  Last BM:  unknown  Height:   Ht Readings from Last 1 Encounters:  01/05/15 6' 2.5" (1.892 m)    Weight:   Wt Readings from Last 1 Encounters:  02/06/15 263 lb 7.2 oz (119.5 kg)    Ideal Body Weight:  87.7 kg  BMI:  Body mass index is 33.38 kg/(m^2).  Estimated Nutritional Needs:   Kcal:  1380-1757  Protein:  >/= 175 gm  Fluid:  as renal function allows  EDUCATION NEEDS:   No education needs identified at this time  Joaquin Courts, RD, LDN, CNSC Pager [phone] After Hours Pager [phone]

## 2015-02-06 NOTE — Progress Notes (Signed)
eLink Physician-Brief Progress Note Patient Name: JONTAVIUS YOHO DOB: Sep 29, 1955 MRN: [id number]   Date of Service  02/06/2015  HPI/Events of Note  RN notified Elink MD of high propofol requirement for sedation.  eICU Interventions  Start Fentanyl gtt w/ bolus via infusion.     Intervention Category Major Interventions: Other:  Lawanda Cousins 02/06/2015, 4:58 PM

## 2015-02-06 NOTE — Progress Notes (Addendum)
Physical Therapy Treatment Patient Details Name: Jared [name] MRN: [id number] DOB: 1955/10/29 Today's Date: 02/06/2015    History of Present Illness Pt is a 59 y/o male with a PMH of chronic back pain, chest pain, HTN, hyerlipidemia, obesity, sleep apnea, depression. Pt was found down at home by wife on 01/08/15. He was intubated due to acute respiratory failure. Pt had exploratory laparotomy, cardiac catheterization, and received dialysis treatment. Pt's primary issues are pulmonary embolism and difficulty with his abdominal wound. Pt's active problems are spetic shock, acute respiratory failure with hypoxia, acute respiratory failure with hypoxemia, cardigenic shock, acute kidney injury, and renal failure. Pt was extubated on 01/18/15 after a 10 day intubation period. Had been on bedrest due to temporary femoral HD catheter, with tunneled HD catheter placed on 9/8    PT Comments    Pt now intubated in the ICU and goals were downgraded to reflect current functional status. Pt was able to tolerate EOB briefly and respiratory was called in for deep suctioning due to excessive secretions. Will continue to follow and progress as able per POC.   Follow Up Recommendations  CIR;Supervision/Assistance - 24 hour     Equipment Recommendations  3in1 (PT)    Recommendations for Other Services OT consult     Precautions / Restrictions Precautions Precautions: Fall Restrictions Weight Bearing Restrictions: No    Mobility  Bed Mobility Overal bed mobility: Needs Assistance;+2 for physical assistance Bed Mobility: Supine to Sit;Sit to Supine     Supine to sit: Total assist;+2 for physical assistance Sit to supine: Total assist;+2 for physical assistance   General bed mobility comments: +2 assist required for all aspects of bed mobility. Bed pad used for assist in scooting and positioning.   Transfers                 General transfer comment: Deferred due to decreased  safety.  Ambulation/Gait                 Stairs            Wheelchair Mobility    Modified Rankin (Stroke Patients Only)       Balance Overall balance assessment: Needs assistance Sitting-balance support: Feet supported;Bilateral upper extremity supported Sitting balance-Leahy Scale: Poor Sitting balance - Comments: Assist required to maintain upright posture and often pt was resting his head on therapist's chest for support.                             Cognition Arousal/Alertness: Lethargic Behavior During Therapy: Agitated;Anxious;Impulsive Overall Cognitive Status: Impaired/Different from baseline Area of Impairment: Safety/judgement   Current Attention Level: Focused   Following Commands: Follows one step commands inconsistently;Follows one step commands with increased time Safety/Judgement: Decreased awareness of safety;Decreased awareness of deficits Awareness: Intellectual Problem Solving: Slow processing;Requires verbal cues;Requires tactile cues      Exercises      General Comments        Pertinent Vitals/Pain Pain Assessment: Faces Faces Pain Scale: Hurts a little bit Pain Location: Grimacing. Unable to tell me where he was feeling pain. Likely abdomen as it was during supine>sit.  Pain Descriptors / Indicators: Grimacing Pain Intervention(s): Limited activity within patient's tolerance;Monitored during session;Repositioned    Home Living Family/patient expects to be discharged to:: Private residence                    Prior Function  PT Goals (current goals can now be found in the care plan section) Acute Rehab PT Goals PT Goal Formulation: Patient unable to participate in goal setting Time For Goal Achievement: 02/16/15 Potential to Achieve Goals: Fair Progress towards PT goals: Goals downgraded-see care plan    Frequency  Min 3X/week    PT Plan Discharge plan needs to be updated;Frequency needs  to be updated    Co-evaluation             End of Session Equipment Utilized During Treatment: Gait belt Activity Tolerance: Patient tolerated treatment well Patient left: in bed;with call bell/phone within reach;with family/visitor present     Time: 1030-1105 PT Time Calculation (min) (ACUTE ONLY): 35 min  Charges:  $Therapeutic Activity: 8-22 mins                    G Codes:      Conni Slipper 02/24/15, 3:10 PM

## 2015-02-06 NOTE — Progress Notes (Addendum)
PULMONARY / CRITICAL CARE MEDICINE   Name: Jared [name] MRN: [id number] DOB: Mar 08, 1956    ADMISSION DATE:  01/08/2015 CONSULTATION DATE:  8/18  REFERRING MD :  EDP Dr Criss Alvine  CHIEF COMPLAINT:  Found down  BRIEF DESCRIPTION:   59 yo male had severe abdominal pain, and then found unresponsive at home.  In ER he was in shock, cyanotic, with lactic acidosis, and hyperkalemia. Initial suspicion was for PE, however VQ was low prob. Etiology for such acute deterioration remains unclear, but is presumed to be occult septic shock by exclusion of PE, MI, bowel ischemia. Has been recovering, however has required continued dialysis. 9/13 suffered respiratory arrest and seizures requiring intubation and ICU transfer. Transferred to the floor. Admit back to ICU on 9/13 after seizures, PRES syndrome. Intubated for resp failure.   STUDIES:  8/18 Echo > EF 45 to 50%, mod RV dilation, mod/severe RV dysfx, PAS 47 mmHg 8/18 Renal u/s >> b/l renal echogenicity 8/23 LHC - left circumflex, second obtuse marginal branch 100% occlusion   SIGNIFICANT EVENTS: 8/18 Admit, CCS, cardiology consulted; brief PEA arrest; laparotomy with placement of wound vac; started heparin gtt for presumed PE 8/19 Renal consulted. Duplex LE negative for DVT 8/20 CRRT  8/21 OR for abd wound closure  8/23  Ileus, vanc stopped 8/26  Tolerating intemittent HD - oliguric. Vanc Stopped 8/27  hypertensive - on hydralazine prn. Anuric. Being weaned off dilaudid.  8/28  Extubated  TPN.  9/5 to tele 9/13 Seizure, resp arrest > intubated > to ICU   SUBJECTIVE/OVERNIGHT/INTERVAL HX 59 year old male with description as above. He was had been slowly recovering to the degree that he was transferred to telemetry floor 9/5. Since that time he had still been requiring HD, and has give little hope for renal recovery. He was doing well and even ambulating with his family on the unit. Most of his acute issues for his hospitalization had mostly  resolved or were close to resolving. 9/12 he developed headache and had gotten some phenergan, Toradol. and benadryl. Also got IV dilaudid a little later. 9/13 patient has drowsy, and family reports that he was complaining of nausea, headache, and face tingling. He then had a witnessed grand mal seizure. Rapid response was called and upon the RN's arrival he was seizing with dilated pupils and R lateral gaze preference. He was in acute respiratory distress with pale, diaphoretic skin. Breath sounds were coarse with frothy white secretions. He was hypertensive. He was given supplemental O2. He then had another seizure and bit tongue. He was given ativan 2mg , code blue was called. He never lost pulses, or relative hemodynamic stability.  And he was eventually intubated by CRNA. He was transferred to ICU. Once RSI meds wore off he began seizing again, which was refractory to an additional 8 mg of versed. He was started on propofol and clinical seizures seemingly have stopped. PCCM to assume care.   VITAL SIGNS: Temp:  [98.3 F (36.8 C)-100 F (37.8 C)] 99.9 F (37.7 C) (09/16 1202) Pulse Rate:  [53-74] 55 (09/16 1115) Resp:  [13-26] 23 (09/16 1115) BP: (126-162)/(60-99) 154/81 mmHg (09/16 1115) SpO2:  [97 %-100 %] 100 % (09/16 1115) FiO2 (%):  [40 %] 40 % (09/16 1115) Weight:  [263 lb 7.2 oz (119.5 kg)] 263 lb 7.2 oz (119.5 kg) (09/16 0430) HEMODYNAMICS:   VENTILATOR SETTINGS: Vent Mode:  [-] PSV;CPAP FiO2 (%):  [40 %] 40 % Set Rate:  [20 bmp] 20 bmp Vt  Set:  [580 mL] 580 mL PEEP:  [5 cmH20] 5 cmH20 Pressure Support:  [8 cmH20] 8 cmH20 Plateau Pressure:  [17 cmH20] 17 cmH20 INTAKE / OUTPUT:  Intake/Output Summary (Last 24 hours) at 02/06/15 1359 Last data filed at 02/06/15 1200  Gross per 24 hour  Intake 1425.23 ml  Output   4200 ml  Net -2774.77 ml   Total I/O In: 70.8 [I.V.:70.8] Out: 670 [Urine:670]  I/O last 3 completed shifts: In: 2823.1 [I.V.:2178.1; NG/GT:210; IV  Piggyback:435] Out: 5910 [Urine:2655; Emesis/NG output:2250; Other:1005]  PHYSICAL EXAMINATION: General: Awake, responsive. On vent. Neuro: No gross focal deficits.  HEENT: Moist mucus membranes. No JVD. Cardiovascular: RRR, no MRG. Lungs: Coarse breath sounds. Abdomen: Obese, soft, surgical dressing in place.  Musculoskeletal: No acute deformity.  LABS:  PULMONARY  Recent Labs Lab 02/03/15 1425 02/03/15 1748 02/04/15 1315 02/05/15 0420  PHART 7.069* 7.565* 7.453* 7.486*  PCO2ART 36.2 26.4* 37.5 32.7*  PO2ART 141.0* 82.0 57.0* 102*  HCO3 10.4* 23.9 26.2* 24.4*  TCO2 25.4  O2SAT 98.0 98.0 90.0 97.9    CBC  Recent Labs Lab 02/04/15 0238 02/05/15 0250 02/06/15 0236  HGB 9.8* 8.7* 8.8*  HCT 29.2* 26.6* 27.2*  WBC 11.3* 9.9 8.8  PLT 238 135* 105*   COAGULATION No results for input(s): INR in the last 168 hours.  CARDIAC    Recent Labs Lab 02/03/15 1610 02/03/15 2010 02/04/15 0238  TROPONINI 1.12* 1.99* 1.54*   No results for input(s): PROBNP in the last 168 hours.  CHEMISTRY  Recent Labs Lab 02/02/15 0406 02/03/15 0448 02/03/15 1415 02/03/15 1610 02/04/15 0238 02/05/15 0250 02/06/15 0236  NA 136 138 QUESTIONABLE RESULTS, RECOMMEND RECOLLECT TO VERIFY 137 140 139 139  138  K 3.9 4.0  --  4.6 4.0 3.9 3.9  3.9  CL 98* 102  --  101 103 101 103  101  CO2 27 26  --  19* GLUCOSE 96 97  --  162* 114* 92 83  82  BUN 58* 62*  --  67* 46* 52* 34*  34*  CREATININE 12.21* 12.45*  --  13.21* 8.49* 9.55* 6.40*  6.34*  CALCIUM 7.9* 8.2*  --  8.1* 8.2* 7.9* 7.7*  7.6*  MG  --   --   --   --  2.2 2.4 2.1  PHOS 6.1* 6.2*  --   --  3.3 6.8* 4.9*   Estimated Creatinine Clearance: 16.9 mL/min (by C-G formula based on Cr of 6.34).  LIVER  Recent Labs Lab 02/03/15 0448 02/03/15 1610 02/04/15 0238 02/05/15 0250 02/06/15 0236  AST  --  48*  --   --   --   ALT  --  12*  --   --   --   ALKPHOS  --  108  --   --   --   BILITOT   --  1.0  --   --   --   PROT  --  5.6*  --   --   --   ALBUMIN 2.1* 2.1* 2.1* 2.0* 2.1*   INFECTIOUS  Recent Labs Lab 02/03/15 2010 02/04/15 0238 02/04/15 0846 02/05/15 0250  LATICACIDVEN 1.4 0.9 1.2  --   PROCALCITON 11.75 21.41  --  25.19   ENDOCRINE CBG (last 3)   Recent Labs  02/06/15 0422 02/06/15 0815 02/06/15 1200  GLUCAP 83 85 86   IMAGING   ASSESSMENT / PLAN:  PULMONARY ETT 8/18 >8/28, 9/13 >>>  A: Acute respiratory failure in setting seizure vs aspiration OSA ? PE essentailly ruled out with neg DVT and low prob VQ prior in admit  P:   Full vent support.  Wean sedation Daily SBTs.  CARDIOVASCULAR Rt femoral CVL 8/18 >>8/20 Lt femoral A line 8/18 >>8/28 RIJ HD 8/20 >? LIJ 8/20 >? RIJ perm cath >>>  A:  Hypertenson Elevated lactic acid > suspect due to siezure Shock > Resolved. Likely RV failure with possible PE.-although venous doppler neg for DVT  X Cath 8/06/25/14 OM branch occlusion per report and unlikely cause of admission RV failure AFRVR - resolved  P:  Telemetry monitoring Restart nebivolol. On amlodipine.    RENAL A: ESRD new onset . Was on CRRT now intermittent HD dependent, - RF and pan autoimmune negative 01/14/15  P:   Nephrology following HD tomorrow with removal of fluids.    GASTROINTESTINAL A: Acute abdominal pain s/p laparotomy with wound vac 8/18 >wound vac change 8/19 d/t eviceration>to OR 8/21 with wound closure Shock Liver > resolved  P:  Restart tube feeds. 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   P:   Day 7 vanc completed 01/14/15 Day 10  Zosyn to be stopped 01/17/15  Observe  off antibiotics.  ENDOCRINE A: Hyperglycemia   P:   CBG and SSI  NEUROLOGIC A: Status epilepticus PRES syndrome. Acute encephalopathy. Metabolic + seizure component Hx  chronic back pain, anxiety, depression.  P:   RASS goal: -1 On Keppra and Vimpat Seizure treatment per Neuro MRI brain c/w PRES Holding outpt xanax, celexa, oxycodone, lyrica  Updates: wife updated 9/16  Chilton Greathouse MD [facility] Pager [phone] If no answer or after 3pm call: [phone] 02/06/2015, 1:59 PM

## 2015-02-07 ENCOUNTER — Inpatient Hospital Stay (HOSPITAL_COMMUNITY): Payer: Medicare Other

## 2015-02-07 LAB — CBC WITH DIFFERENTIAL/PLATELET
Basophils Absolute: 0.1 10*3/uL (ref 0.0–0.1)
Basophils Relative: 2 %
EOS ABS: 0.4 10*3/uL (ref 0.0–0.7)
EOS PCT: 6 %
HCT: 25.9 % — ABNORMAL LOW (ref 39.0–52.0)
HEMOGLOBIN: 8.5 g/dL — AB (ref 13.0–17.0)
LYMPHS ABS: 0.8 10*3/uL (ref 0.7–4.0)
Lymphocytes Relative: 13 %
MCH: 30 pg (ref 26.0–34.0)
MCHC: 32.8 g/dL (ref 30.0–36.0)
MCV: 91.5 fL (ref 78.0–100.0)
MONO ABS: 0.4 10*3/uL (ref 0.1–1.0)
MONOS PCT: 7 %
Neutro Abs: 4.8 10*3/uL (ref 1.7–7.7)
Neutrophils Relative %: 73 %
PLATELETS: 87 10*3/uL — AB (ref 150–400)
RBC: 2.83 MIL/uL — ABNORMAL LOW (ref 4.22–5.81)
RDW: 15.1 % (ref 11.5–15.5)
WBC: 6.6 10*3/uL (ref 4.0–10.5)

## 2015-02-07 LAB — BASIC METABOLIC PANEL
Anion gap: 10 (ref 5–15)
BUN: 44 mg/dL — AB (ref 6–20)
CO2: 27 mmol/L (ref 22–32)
CREATININE: 6.36 mg/dL — AB (ref 0.61–1.24)
Calcium: 7.7 mg/dL — ABNORMAL LOW (ref 8.9–10.3)
Chloride: 106 mmol/L (ref 101–111)
GFR calc Af Amer: 10 mL/min — ABNORMAL LOW (ref >60)
GFR, EST NON AFRICAN AMERICAN: 8 mL/min — AB (ref >60)
GLUCOSE: 100 mg/dL — AB (ref 65–99)
POTASSIUM: 3.5 mmol/L (ref 3.5–5.1)
Sodium: 143 mmol/L (ref 135–145)

## 2015-02-07 LAB — BLOOD GAS, ARTERIAL
ACID-BASE EXCESS: 2.2 mmol/L — AB (ref 0.0–2.0)
Bicarbonate: 25.6 mEq/L — ABNORMAL HIGH (ref 20.0–24.0)
DRAWN BY: 345601
FIO2: 0.4
MECHVT: 580 mL
O2 Saturation: 98.2 %
PEEP/CPAP: 5 cmH2O
PH ART: 7.467 — AB (ref 7.350–7.450)
PO2 ART: 107 mmHg — AB (ref 80.0–100.0)
Patient temperature: 98.6
RATE: 20 resp/min
TCO2: 26.7 mmol/L (ref 0–100)
pCO2 arterial: 35.9 mmHg (ref 35.0–45.0)

## 2015-02-07 LAB — GLUCOSE, CAPILLARY
Glucose-Capillary: 102 mg/dL — ABNORMAL HIGH (ref 65–99)
Glucose-Capillary: 79 mg/dL (ref 65–99)
Glucose-Capillary: 91 mg/dL (ref 65–99)
Glucose-Capillary: 93 mg/dL (ref 65–99)

## 2015-02-07 LAB — C DIFFICILE QUICK SCREEN W PCR REFLEX
C DIFFICILE (CDIFF) TOXIN: NEGATIVE
C Diff antigen: POSITIVE — AB

## 2015-02-07 LAB — TRIGLYCERIDES: Triglycerides: 606 mg/dL — ABNORMAL HIGH (ref <150)

## 2015-02-07 LAB — PHOSPHORUS: PHOSPHORUS: 4.6 mg/dL (ref 2.5–4.6)

## 2015-02-07 LAB — MAGNESIUM: MAGNESIUM: 2 mg/dL (ref 1.7–2.4)

## 2015-02-07 MED ORDER — HALOPERIDOL LACTATE 5 MG/ML IJ SOLN
2.0000 mg | Freq: Once | INTRAMUSCULAR | Status: AC
  Administered 2015-02-07: 2 mg via INTRAVENOUS
  Filled 2015-02-07: qty 1

## 2015-02-07 MED ORDER — HYDRALAZINE HCL 10 MG PO TABS
10.0000 mg | ORAL_TABLET | Freq: Four times a day (QID) | ORAL | Status: DC
  Administered 2015-02-08 (×2): 10 mg via ORAL
  Filled 2015-02-07 (×11): qty 1

## 2015-02-07 MED ORDER — SODIUM CHLORIDE 0.9 % IV SOLN
25.0000 ug/h | INTRAVENOUS | Status: DC
  Administered 2015-02-07: 300 ug/h via INTRAVENOUS
  Filled 2015-02-07: qty 50

## 2015-02-07 MED ORDER — FENTANYL CITRATE (PF) 100 MCG/2ML IJ SOLN
50.0000 ug | INTRAMUSCULAR | Status: DC | PRN
  Administered 2015-02-07 – 2015-02-10 (×6): 50 ug via INTRAVENOUS
  Filled 2015-02-07 (×6): qty 2

## 2015-02-07 MED ORDER — ONDANSETRON HCL 4 MG/2ML IJ SOLN
4.0000 mg | Freq: Four times a day (QID) | INTRAMUSCULAR | Status: DC | PRN
  Administered 2015-02-07 – 2015-02-11 (×4): 4 mg via INTRAVENOUS
  Filled 2015-02-07 (×4): qty 2

## 2015-02-07 MED ORDER — DEXMEDETOMIDINE HCL IN NACL 400 MCG/100ML IV SOLN
0.4000 ug/kg/h | INTRAVENOUS | Status: DC
  Administered 2015-02-07 (×2): 0.5 ug/kg/h via INTRAVENOUS
  Administered 2015-02-07 – 2015-02-08 (×4): 1.2 ug/kg/h via INTRAVENOUS
  Administered 2015-02-08: 1.1 ug/kg/h via INTRAVENOUS
  Administered 2015-02-08: 1.2 ug/kg/h via INTRAVENOUS
  Administered 2015-02-08: 1.049 ug/kg/h via INTRAVENOUS
  Administered 2015-02-08 (×2): 1.1 ug/kg/h via INTRAVENOUS
  Administered 2015-02-08 (×2): 1.2 ug/kg/h via INTRAVENOUS
  Administered 2015-02-09 (×2): 0.9 ug/kg/h via INTRAVENOUS
  Administered 2015-02-09: 1.2 ug/kg/h via INTRAVENOUS
  Administered 2015-02-09: 1 ug/kg/h via INTRAVENOUS
  Administered 2015-02-09: 0.898 ug/kg/h via INTRAVENOUS
  Administered 2015-02-09 (×3): 1.2 ug/kg/h via INTRAVENOUS
  Administered 2015-02-10: 1 ug/kg/h via INTRAVENOUS
  Administered 2015-02-10: 1.1 ug/kg/h via INTRAVENOUS
  Administered 2015-02-10 (×2): 1.2 ug/kg/h via INTRAVENOUS
  Filled 2015-02-07: qty 100
  Filled 2015-02-07: qty 50
  Filled 2015-02-07 (×2): qty 100
  Filled 2015-02-07: qty 50
  Filled 2015-02-07 (×6): qty 100
  Filled 2015-02-07 (×3): qty 200
  Filled 2015-02-07 (×8): qty 100

## 2015-02-07 NOTE — Progress Notes (Signed)
IV Fentanyl wasted in sink . Witnessed via [employer], [employer] fundraiser.

## 2015-02-07 NOTE — Progress Notes (Signed)
eLink Physician-Brief Progress Note Patient Name: Jared [name] DOB: 19-Aug-1955 MRN: [id number]   Date of Service  02/07/2015  HPI/Events of Note  Patient had fall getting out of bed. Camera check shows patient in bed now. Complaining of headache.  eICU Interventions  CT Head/Neck w/o stat.     Intervention Category Intermediate Interventions: Other:  Lawanda Cousins 02/07/2015, 9:14 PM

## 2015-02-07 NOTE — Procedures (Signed)
Patient seen on Hemodialysis. QB 400, UF goal 1L Treatment adjusted as needed.  Zetta Bills MD [facility]. Office # [phone] Pager # [phone] 8:14 AM

## 2015-02-07 NOTE — Procedures (Signed)
Extubation Procedure Note  Patient Details:   Name: Jared [name] DOB: [birth year] MRN: [id number]   Airway Documentation:     Evaluation  O2 sats: stable throughout Complications: No apparent complications Patient did tolerate procedure well. Bilateral Breath Sounds: Clear Suctioning: Oral, Airway Yes   Patient extubated to 3L nasal cannula per MD order.  Positive cuff leak noted.  No evidence of stridor.  Patient able to speak post extubation.  Vitals are stable.  No apparent complications.   [name] N 02/07/2015, 2:15 PM

## 2015-02-07 NOTE — Progress Notes (Signed)
eLink Physician-Brief Progress Note Patient Name: Jared [name] DOB: Nov 19, 1955 MRN: [id number]   Date of Service  02/07/2015  HPI/Events of Note  BP intermittently elevated & pt comfortable.  eICU Interventions  Starting Hydralazine 10mg  po q6hr.     Intervention Category Major Interventions: Hypertension - evaluation and management  Lawanda Cousins 02/07/2015, 8:09 PM

## 2015-02-07 NOTE — Progress Notes (Signed)
eLink Physician-Brief Progress Note Patient Name: Jared [name] DOB: [birth year] MRN: [id number]   Date of Service  02/07/2015  HPI/Events of Note  RN notified E MD pt agitated & trying to crawl out of the bed. Camera chk revealed pt coughing vs dry heaves. Doesn't appear overly agitated with RN & son at bedside. Spoke with son who confirms patient disoriented.  eICU Interventions  Start Precedex as needed for agitation. Requested nurse give Zofran prn for N/V & Hydralazine for BP.     Intervention Category Minor Interventions: Agitation / anxiety - evaluation and management  Lawanda Cousins 02/07/2015, 4:43 PM

## 2015-02-07 NOTE — Progress Notes (Signed)
Patient ID: Jared [name], male   DOB: 05-11-1956, 59 y.o.   MRN: [id number]  [facility] Progress Note    Assessment/ Plan:   1. 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 Good urine output but no evident renal recovery- hemodialysis today to maintain Tuesday/Thursday/Saturday schedule via right IJ tunneled dialysis catheter. Permanent access placement deferred after patient decompensated following GTC seizure/respiratory failure.  2. SIRS- previously on broad-spectrum antibiotics as well as empiric management for PE-this discontinued after negative data/low likelihood on VQ scan 3. CAD- cath revealed occluded OM, ongoing medical management 4. S/p exploratory lap and fascia closure now s/p wound vac - per CCS/wound care 5. Nutrition: off TPN, taking some PO, continue oral nutritional supplementation 6. Anemia- hemoglobin stable status post PRBCs and Aranesp/intravenous iron 7. CKD/MBD- currently intubated/nothing by mouth-binders on hold. PTH level acceptable and not on the SVC/RA. 8. Seizure episode/acute respiratory failure: EEG done-report reviewed. Remains on ventilator support  Subjective:    No acute events overnight-no recurrence of seizures-possible extubation later today. Noted to be febrile overnight    Objective:   BP 149/66 mmHg  Pulse 59  Temp(Src) 100.5 F (38.1 C) (Oral)  Resp 20  Wt 119.4 kg (263 lb 3.7 oz)  SpO2 96%  Intake/Output Summary (Last 24 hours) at 02/07/15 0758 Last data filed at 02/07/15 0700  Gross per 24 hour  Intake 2943.59 ml  Output   2730 ml  Net 213.59 ml   Weight change: -1.1 kg (-2 lb 6.8 oz)  Physical Exam: Gen: Intubated, opens his eyes on calling out his name CVS: Pulse regular in rate and rhythm, S1 and S2 normal Resp:  Decreased breath sounds over bases otherwise clear Abd: Soft, obese, abdominal binder in situ Ext: Trace lower extremity edema  Imaging: Mr Brain Wo Contrast  02/05/2015   CLINICAL DATA:  Seizures.  Abnormal CT scan.  Contrast could not be administered secondary to a renal failure.  EXAM: MRI HEAD WITHOUT CONTRAST  TECHNIQUE: Multiplanar, multiecho pulse sequences of the brain and surrounding structures were obtained without intravenous contrast.  COMPARISON:  CT head without contrast 02/03/2015.  FINDINGS: Subcortical T2 changes in the occipital and parietal lobes bilaterally are compatible with the previously suggested diagnosis of posterior reversible encephalopathy syndrome. T2 changes are also noted within the left cerebellum. There is no restricted diffusion. Focal T2 changes are noted along the posterior limb of the left internal capsule and cortical spinal tracts.  No significant anterior white matter disease is present.  The basal ganglia and brainstem are within normal limits otherwise. The internal auditory canals are unremarkable.  Flow is present in the major intracranial arteries. The globes and orbits are intact minimal mucosal thickening is present in the anterior ethmoid air cells bilaterally.  Fluid is present in the mastoid air cells bilaterally. Fluid is also present in the oropharynx. This is likely secondary to the endotracheal tube.  IMPRESSION: 1. Subcortical T2 changes compatible with posterior reversible encephalopathy syndrome. No restricted diffusion is present. 2. Fluid in the nasopharynx and bilateral mastoid air cells likely secondary to intubation.   Electronically Signed   By: Marin Roberts M.D.   On: 02/05/2015 13:50   Dg Chest Port 1 View  02/06/2015   CLINICAL DATA:  Acute respiratory failure  EXAM: PORTABLE  CHEST - 1 VIEW  COMPARISON:  02/05/2015  FINDINGS: Tubular devices are stable. Bilateral central and basilar haziness likely a combination of airspace disease  and pleural effusion has improved. No pneumothorax.  IMPRESSION: Improved bibasilar haziness as likely a combination of pleural effusion and airspace disease.   Electronically Signed   By: Jolaine Click M.D.   On: 02/06/2015 07:57    Labs: BMET  Recent Labs Lab 02/01/15 0616 02/02/15 0406 02/03/15 0448 02/03/15 1415 02/03/15 1610 02/04/15 0238 02/05/15 0250 02/06/15 0236 02/07/15 0249  NA 138 136 138 QUESTIONABLE RESULTS, RECOMMEND RECOLLECT TO VERIFY 137 140 139 139  138 143  K 4.1 3.9 4.0  --  4.6 4.0 3.9 3.9  3.9 3.5  CL 100* 98* 102  --  101 103 101 103  101 106  CO2 26 27 26   --  19* 25 25 26  25 27   GLUCOSE 91 96 97  --  162* 114* 92 83  82 100*  BUN 51* 58* 62*  --  67* 46* 52* 34*  34* 44*  CREATININE 11.59* 12.21* 12.45*  --  13.21* 8.49* 9.55* 6.40*  6.34* 6.36*  CALCIUM 8.1* 7.9* 8.2*  --  8.1* 8.2* 7.9* 7.7*  7.6* 7.7*  PHOS 5.7* 6.1* 6.2*  --   --  3.3 6.8* 4.9* 4.6   CBC  Recent Labs Lab 02/04/15 0238 02/05/15 0250 02/06/15 0236 02/07/15 0249  WBC 11.3* 9.9 8.8 6.6  NEUTROABS 10.2* 8.3* 7.2 4.8  HGB 9.8* 8.7* 8.8* 8.5*  HCT 29.2* 26.6* 27.2* 25.9*  MCV 87.2 89.6 90.7 91.5  PLT 238 135* 105* 87*   Medications:    . amLODipine  10 mg Oral Daily  . antiseptic oral rinse  7 mL Mouth Rinse QID  . aspirin  81 mg Per Tube Daily  . chlorhexidine gluconate  15 mL Mouth Rinse BID  . darbepoetin (ARANESP) injection - DIALYSIS  100 mcg Intravenous Q Mon-HD  . feeding supplement (NEPRO CARB STEADY)  1,000 mL Oral Q24H  . feeding supplement (PRO-STAT SUGAR FREE 64)  60 mL Per Tube 5 X Daily  . heparin subcutaneous  5,000 Units Subcutaneous 3 times per day  . lacosamide (VIMPAT) IV  100 mg Intravenous Q12H  . lanthanum  500 mg Oral TID WC  . levETIRAcetam  1,000 mg Intravenous Q12H  . multivitamin with minerals  1 tablet Oral Daily  . nebivolol  5 mg Oral Daily  . pantoprazole sodium  40 mg Per Tube Daily  . sodium chloride  10 mL Intravenous Q12H    Zetta Bills, MD 02/07/2015, 7:58 AM

## 2015-02-07 NOTE — Progress Notes (Signed)
PULMONARY / CRITICAL CARE MEDICINE   Name: Jared [name] MRN: [id number] DOB: [birth year]    ADMISSION DATE:  01/08/2015 CONSULTATION DATE:  8/18  REFERRING MD :  EDP Dr Criss Alvine  CHIEF COMPLAINT:  Found down  BRIEF DESCRIPTION:   58 yo male had severe abdominal pain, and then found unresponsive at home.  In ER he was in shock, cyanotic, with lactic acidosis, and hyperkalemia. Initial suspicion was for PE, however VQ was low prob. Etiology for such acute deterioration remains unclear, but is presumed to be occult septic shock by exclusion of PE, MI, bowel ischemia. Has been recovering, however has required continued dialysis. 9/13 suffered respiratory arrest and seizures requiring intubation and ICU transfer. Transferred to the floor. Admit back to ICU on 9/13 after seizures, PRES syndrome. Intubated for resp failure.   STUDIES:  8/18 Echo > EF 45 to 50%, mod RV dilation, mod/severe RV dysfx, PAS 47 mmHg 8/18 Renal u/s >> b/l renal echogenicity 8/23 LHC - left circumflex, second obtuse marginal branch 100% occlusion   SIGNIFICANT EVENTS: 8/18 Admit, CCS, cardiology consulted; brief PEA arrest; laparotomy with placement of wound vac; started heparin gtt for presumed PE 8/19 Renal consulted. Duplex LE negative for DVT 8/20 CRRT  8/21 OR for abd wound closure  8/23  Ileus, vanc stopped 8/26  Tolerating intemittent HD - oliguric. Vanc Stopped 8/27  hypertensive - on hydralazine prn. Anuric. Being weaned off dilaudid.  8/28  Extubated  TPN.  9/5 to tele 9/13 Seizure, resp arrest > intubated > to ICU   SUBJECTIVE/OVERNIGHT: no acute change.  On HD this am. Increased sedation requirements overnight, now comfortable on propofol, fent gtt   VITAL SIGNS: Temp:  [98.9 F (37.2 C)-100.5 F (38.1 C)] 100.1 F (37.8 C) (09/17 0904) Pulse Rate:  [53-76] 56 (09/17 1030) Resp:  [0-23] 18 (09/17 1030) BP: (124-166)/(63-81) 124/66 mmHg (09/17 1030) SpO2:  [96 %-100 %] 99 % (09/17 1030) FiO2  (%):  [40 %] 40 % (09/17 0850) Weight:  [263 lb 3.7 oz (119.4 kg)] 263 lb 3.7 oz (119.4 kg) (09/17 0400) HEMODYNAMICS:   VENTILATOR SETTINGS: Vent Mode:  [-] PRVC FiO2 (%):  [40 %] 40 % Set Rate:  [20 bmp] 20 bmp Vt Set:  [580 mL] 580 mL PEEP:  [5 cmH20] 5 cmH20 Pressure Support:  [8 cmH20] 8 cmH20 Plateau Pressure:  [18 cmH20] 18 cmH20 INTAKE / OUTPUT:  Intake/Output Summary (Last 24 hours) at 02/07/15 1054 Last data filed at 02/07/15 0800  Gross per 24 hour  Intake 2653.69 ml  Output   2670 ml  Net -16.31 ml   Total I/O In: 60 [NG/GT:60] Out: 275 [Urine:275]  I/O last 3 completed shifts: In: 3964.4 [I.V.:2651.9; NG/GT:877.5; IV Piggyback:435] Out: 4785 [Urine:3185; Emesis/NG output:1600]  PHYSICAL EXAMINATION: General: sedated on vent, getting HD, NAD Neuro: sedated, RASS -2, agitated overnight per nsg.   HEENT: Moist mucus membranes. No JVD. Cardiovascular: RRR, no MRG. Lungs: resps even on labored on vent, Coarse breath sounds. Abdomen: Obese, soft, surgical dressing in place.  Musculoskeletal: No acute deformity.  LABS:  PULMONARY  Recent Labs Lab 02/03/15 1425 02/03/15 1748 02/04/15 1315 02/05/15 0420 02/07/15 0500  PHART 7.069* 7.565* 7.453* 7.486* 7.467*  PCO2ART 36.2 26.4* 37.5 32.7* 35.9  PO2ART 141.0* 82.0 57.0* 102* 107*  HCO3 10.4* 23.9 26.2* 24.4* 25.6*  TCO2 25.4 26.7  O2SAT 98.0 98.0 90.0 97.9 98.2    CBC  Recent Labs Lab 02/05/15 0250 02/06/15 0236 02/07/15 0249  HGB 8.7* 8.8* 8.5*  HCT 26.6* 27.2* 25.9*  WBC 9.9 8.8 6.6  PLT 135* 105* 87*   COAGULATION No results for input(s): INR in the last 168 hours.  CARDIAC    Recent Labs Lab 02/03/15 1610 02/03/15 2010 02/04/15 0238  TROPONINI 1.12* 1.99* 1.54*   No results for input(s): PROBNP in the last 168 hours.  CHEMISTRY  Recent Labs Lab 02/03/15 0448  02/03/15 1610 02/04/15 0238 02/05/15 0250 02/06/15 0236 02/07/15 0249  NA 138  < > 137 140 139 139   138 143  K 4.0  --  4.6 4.0 3.9 3.9  3.9 3.5  CL 102  --  101 103 101 103  101 106  CO2 26  --  19* 25 25 26  25 27   GLUCOSE 97  --  162* 114* 92 83  82 100*  BUN 62*  --  67* 46* 52* 34*  34* 44*  CREATININE 12.45*  --  13.21* 8.49* 9.55* 6.40*  6.34* 6.36*  CALCIUM 8.2*  --  8.1* 8.2* 7.9* 7.7*  7.6* 7.7*  MG  --   --   --  2.2 2.4 2.1 2.0  PHOS 6.2*  --   --  3.3 6.8* 4.9* 4.6  < > = values in this interval not displayed. Estimated Creatinine Clearance: 16.9 mL/min (by C-G formula based on Cr of 6.36).  LIVER  Recent Labs Lab 02/03/15 0448 02/03/15 1610 02/04/15 0238 02/05/15 0250 02/06/15 0236  AST  --  48*  --   --   --   ALT  --  12*  --   --   --   ALKPHOS  --  108  --   --   --   BILITOT  --  1.0  --   --   --   PROT  --  5.6*  --   --   --   ALBUMIN 2.1* 2.1* 2.1* 2.0* 2.1*   INFECTIOUS  Recent Labs Lab 02/03/15 2010 02/04/15 0238 02/04/15 0846 02/05/15 0250  LATICACIDVEN 1.4 0.9 1.2  --   PROCALCITON 11.75 21.41  --  25.19   ENDOCRINE CBG (last 3)   Recent Labs  02/06/15 1609 02/06/15 2028 02/07/15 0024  GLUCAP 79 91 79   IMAGING   ASSESSMENT / PLAN:  PULMONARY ETT 8/18 >8/28, 9/13 >>> A: Acute respiratory failure in setting seizure vs aspiration OSA ? PE essentailly ruled out with neg DVT and low prob VQ prior in admit P:   Vent support - 8cc/kg  F/u CXR  F/u ABG Daily SBT   CARDIOVASCULAR Rt femoral CVL 8/18 >>8/20 Lt femoral A line 8/18 >>8/28 RIJ HD 8/20 >out LIJ 8/20 >? RIJ perm cath >>> A:  Hypertenson Elevated lactic acid > suspect due to siezure Shock > Resolved. Likely RV failure with possible PE.-although venous doppler neg for DVT  X Cath 8/06/25/14 OM branch occlusion per report and unlikely cause of admission RV failure AFRVR - resolved P:  Telemetry monitoring Continue nebivolol, norvasc  PRN hydralazine  Volume removal per HD   RENAL A: ESRD new onset . Was on CRRT now intermittent HD dependent, -  r/t septic shock related ATN- RF and pan autoimmune negative 01/14/15.  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  P:   Day 7 vanc completed 01/14/15 Day 10  Zosyn stopped 01/17/15  Continue Observe off antibiotics.   ENDOCRINE A: Hyperglycemia  P:   CBG and SSI   NEUROLOGIC A: Status epilepticus PRES syndrome. Acute encephalopathy. Metabolic + seizure component Hx  chronic back pain, anxiety, depression. Agitation  Elevated triglycerides  P:   RASS goal: -1 On Keppra and Vimpat Seizure treatment per Neuro MRI brain c/w PRES Holding outpt xanax, celexa, oxycodone, lyrica  Wean off propofol as able with triglycerides >600 Increase fentanyl gtt as needed  Consider low dose enteral rx to help wean off IV sedation    Updates: no family available 9/17   Dirk Dress, NP 02/07/2015  10:54 AM Pager: [phone] or [phone]   Attending Note:  I have examined patient, reviewed labs, studies and notes. I have discussed the case with Jasper Riling, and I agree with the data and plans as amended above. Pt with complicated hosp course, he had arrest with shock that went largely unexplained. Was recovering when he developed VDRF due to seizure / status epilepticus, now resolved. MRI consistent with PRES. He is  awake on my eval, somewhat agitated. Tolerating PSV. I question whether he may vbe able to be extubated - this may be more straightforward than trying to keep him sedated, especially since he is not going to tolerate propofol due to triglycerides. Will assess him for extubation, continue his other medications as above. Independent critical care time is 60 minutes.   Levy Pupa, MD, PhD 02/07/2015, 1:57 PM [facility] [phone] or if no answer [phone]

## 2015-02-07 NOTE — Progress Notes (Signed)
eLink Physician-Brief Progress Note Patient Name: Jared [name] DOB: 1955-10-31 MRN: [id number]   Date of Service  02/07/2015  HPI/Events of Note  RN notified C diff antigen positive & toxin negative.  eICU Interventions  C diff colonized. No tx needed at this time.     Intervention Category Intermediate Interventions: Diagnostic test evaluation  Lawanda Cousins 02/07/2015, 9:29 PM

## 2015-02-08 ENCOUNTER — Inpatient Hospital Stay (HOSPITAL_COMMUNITY): Payer: Medicare Other

## 2015-02-08 LAB — RENAL FUNCTION PANEL
ALBUMIN: 2.2 g/dL — AB (ref 3.5–5.0)
Anion gap: 9 (ref 5–15)
BUN: 31 mg/dL — AB (ref 6–20)
CALCIUM: 7.9 mg/dL — AB (ref 8.9–10.3)
CO2: 26 mmol/L (ref 22–32)
CREATININE: 4.15 mg/dL — AB (ref 0.61–1.24)
Chloride: 108 mmol/L (ref 101–111)
GFR, EST AFRICAN AMERICAN: 16 mL/min — AB (ref >60)
GFR, EST NON AFRICAN AMERICAN: 14 mL/min — AB (ref >60)
Glucose, Bld: 101 mg/dL — ABNORMAL HIGH (ref 65–99)
PHOSPHORUS: 5.3 mg/dL — AB (ref 2.5–4.6)
Potassium: 3.8 mmol/L (ref 3.5–5.1)
SODIUM: 143 mmol/L (ref 135–145)

## 2015-02-08 LAB — CBC WITH DIFFERENTIAL/PLATELET
BASOS ABS: 0.1 10*3/uL (ref 0.0–0.1)
Basophils Relative: 1 %
EOS ABS: 0.2 10*3/uL (ref 0.0–0.7)
EOS PCT: 2 %
HCT: 29.5 % — ABNORMAL LOW (ref 39.0–52.0)
HEMOGLOBIN: 9.6 g/dL — AB (ref 13.0–17.0)
LYMPHS ABS: 0.7 10*3/uL (ref 0.7–4.0)
Lymphocytes Relative: 6 %
MCH: 29.6 pg (ref 26.0–34.0)
MCHC: 32.5 g/dL (ref 30.0–36.0)
MCV: 91 fL (ref 78.0–100.0)
Monocytes Absolute: 0.6 10*3/uL (ref 0.1–1.0)
Monocytes Relative: 5 %
NEUTROS PCT: 86 %
Neutro Abs: 9.7 10*3/uL — ABNORMAL HIGH (ref 1.7–7.7)
PLATELETS: 106 10*3/uL — AB (ref 150–400)
RBC: 3.24 MIL/uL — AB (ref 4.22–5.81)
RDW: 14.7 % (ref 11.5–15.5)
WBC: 11.2 10*3/uL — AB (ref 4.0–10.5)

## 2015-02-08 LAB — GLUCOSE, CAPILLARY
GLUCOSE-CAPILLARY: 95 mg/dL (ref 65–99)
GLUCOSE-CAPILLARY: 98 mg/dL (ref 65–99)
GLUCOSE-CAPILLARY: 88 mg/dL (ref 65–99)
GLUCOSE-CAPILLARY: 96 mg/dL (ref 65–99)
Glucose-Capillary: 96 mg/dL (ref 65–99)
Glucose-Capillary: 97 mg/dL (ref 65–99)
Glucose-Capillary: 99 mg/dL (ref 65–99)

## 2015-02-08 LAB — MAGNESIUM: MAGNESIUM: 1.8 mg/dL (ref 1.7–2.4)

## 2015-02-08 MED ORDER — BUPIVACAINE HCL (PF) 0.25 % IJ SOLN
INTRAMUSCULAR | Status: AC
  Filled 2015-02-08: qty 30

## 2015-02-08 NOTE — Progress Notes (Signed)
Spoke with infection prevention. They stated if a patient comes back positive for either antigen or toxin patient is to be placed on contact. Contact order placed per infection prevention.

## 2015-02-08 NOTE — Progress Notes (Addendum)
02/07/15 2050  What Happened  Was fall witnessed? No  Was patient injured? No  Patient found on floor  Found by Staff-comment  Follow Up  MD notified Jamison Neighbor  Time MD notified 2100  Additional tests Yes-comment (MD will order head CT and neck)  Progress note created (see row info) Yes  Adult Fall Risk Assessment  Risk Factor Category (scoring not indicated) History of more than one fall within 6 months before admission (document High fall risk)  Age 59  Fall History: Fall within 6 months prior to admission 0  Elimination; Bowel and/or Urine Incontinence 0  Elimination; Bowel and/or Urine Urgency/Frequency 2  Medications: includes PCA/Opiates, Anti-convulsants, Anti-hypertensives, Diuretics, Hypnotics, Laxatives, Sedatives, and Psychotropics 5  Patient Care Equipment 2  Mobility-Assistance 2  Mobility-Gait 2  Mobility-Sensory Deficit 2  Cognition-Awareness 1  Cognition-Impulsiveness 2  Cognition-Limitations 4  Total Score 23  Patient's Fall Risk High Fall Risk (>13 points)  Adult Fall Risk Interventions  Required Bundle Interventions *See Row Information* High fall risk - low, moderate, and high requirements implemented  Additional Interventions Fall risk signage  Fall with Injury Screening  Risk For Fall Injury- See Row Information  F;Nurse judgement;D  Intervention(s) for 2 or more risk criteria identified Low Bed  Neurological  Neuro (WDL) X  Level of Consciousness Alert  Orientation Level Oriented to person;Disoriented to place;Disoriented to time;Disoriented to situation  Cognition Follows commands;Impulsive;Poor attention/concentration;Poor judgement;Poor safety awareness  Speech Clear  Pupil Assessment  Yes  R Pupil Size (mm) 3  R Pupil Shape Round  R Pupil Reaction Sluggish  L Pupil Size (mm) 3  L Pupil Shape Round  L Pupil Reaction Sluggish  Additional Pupil Assessments No  Motor Function/Sensation Assessment Motor strength;Motor response;Grip  R Hand Grip  Present  L Hand Grip Present  R Foot Dorsiflexion Present;Moderate  L Foot Dorsiflexion Present;Moderate  R Foot Plantar Flexion Present;Moderate  L Foot Plantar Flexion Present;Moderate  RUE Motor Response Purposeful movement;Responds to commands  RUE Motor Strength 3  LUE Motor Response Purposeful movement;Responds to commands  LUE Motor Strength 3  RLE Motor Response Purposeful movement;Responds to commands  RLE Motor Strength 3  LLE Motor Response Purposeful movement;Responds to commands  LLE Motor Strength 3  Neuro Symptoms None  Neuro Additional Assessments No  Neuro Additional Assessments No  Glasgow Coma Scale  Eye Opening 4  Best Verbal Response (NON-intubated) 4  Best Motor Response 6  Glasgow Coma Scale Score 14  Musculoskeletal  Musculoskeletal (WDL) X  Assistive Device None  Generalized Weakness Yes  Weight Bearing Restrictions No  Integumentary  Integumentary (WDL) X  Skin Color Appropriate for ethnicity  Skin Condition Clammy  Skin Integrity Intact;Surgical Incision (see LDA)  Ecchymosis Location Groin  Ecchymosis Location Orientation Right  Ecchymosis Intervention Other (Comment) (assessed)  Skin Turgor Non-tenting    Family was notified at 2115.

## 2015-02-08 NOTE — Progress Notes (Signed)
Spoke with wife of patient, she approached this RN expressing that she "did not want cpr or ventilation" for him. Further discussion with her revealed that she would like to continue his treatments as far as HD goes but would not want to put him through resuscitation and that it is "in God's hands". If he were to deteriorate she wishes to keep him comfortable. Dr. Sherene Sires notified of this discussion.   Will continue to monitor and provide emotional support for both the patient and his wife.   SHYSHKO, ALEXANDRA R

## 2015-02-08 NOTE — Progress Notes (Signed)
Patient ID: Jared [name], male   DOB: 1955-06-10, 59 y.o.   MRN: [id number]  [facility] Progress Note    Assessment/ Plan:   1. 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 Continues to maintain a good urine output and he is on intermittent dialysis on a Tuesday/Thursday/Saturday schedule via RIJ TDC. Permanent access placement deferred after patient decompensated following GTC seizure/respiratory failure.  2. SIRS- previously on broad-spectrum antibiotics as well as empiric management for PE-this discontinued after negative data/low likelihood on VQ scan 3. CAD- cath revealed occluded OM, ongoing medical management 4. S/p exploratory lap and fascia closure now s/p wound vac - per CCS/wound care 5. Nutrition: off TPN, taking some PO, continue oral nutritional supplementation 6. Anemia- hemoglobin stable status post PRBCs and Aranesp/intravenous iron 7. CKD/MBD- currently intubated/nothing by mouth-binders on hold. PTH level acceptable and not on the SVC/RA. 8. Seizure episode/acute respiratory failure: EEG done-report reviewed. Remains on ventilator support  Subjective:   Significantly agitated overnight after extubation-suffered fall injuring head/neck-CT scan of the head and neck did not show any acute osseous injury or intracranial lesions.    Objective:   BP 155/68 mmHg  Pulse 62  Temp(Src) 99.3 F (37.4 C) (Oral)  Resp 22  Wt 117 kg (257 lb 15 oz)  SpO2 99%  Intake/Output Summary (Last 24 hours) at 02/08/15 0830 Last data filed at 02/08/15 0600  Gross per 24 hour  Intake 1544.11 ml  Output   3385 ml  Net -1840.89 ml   Weight change: -2.4 kg (-5 lb 4.7 oz)  Physical Exam: Gen: Currently appearing somnolent-earlier was  agitated CVS: Pulse regular in rate and rhythm, S1 and S2 normal Resp: Decreased breath sounds over bases otherwise clear Abd: Soft, obese, abdominal binder in situ Ext: Trace lower extremity edema  Imaging: Ct Head Wo Contrast  02/07/2015   CLINICAL DATA:  Unwitnessed fall from bed to floor. Unknown loss of consciousness. Headache and agitation.  EXAM: CT HEAD WITHOUT CONTRAST  CT CERVICAL SPINE WITHOUT CONTRAST  TECHNIQUE: Multidetector CT imaging of the head and cervical spine was performed following the standard protocol without intravenous contrast. Multiplanar CT image reconstructions of the cervical spine were also generated.  COMPARISON:  CT 02/03/2015, MRI 02/05/2015  FINDINGS: CT HEAD FINDINGS  Symmetric hypodensity in the posterior occipital lobes, unchanged in appearance and distribution from prior exam. No superimposed hemorrhage. There is no intracranial hemorrhage or fluid collection. No CT findings of acute infarct. The ventricles and extra-axial CSF spaces are unchanged, no hydrocephalus. Diffuse opacification of the mastoid air cells, unchanged. Scattered mucosal thickening of the ethmoid air cells. No calvarial fracture.  CT CERVICAL SPINE FINDINGS  Anterior fusion C5 through C7 with interbody spacers. There is bony fusion of C4-C5 vertebral bodies. No hardware complication. Straightening of normal cervical lordosis. No listhesis. No acute fracture. Vertebral body heights are maintained. The dens is intact. Lateral masses of C1 well aligned on C2. Scattered facet arthropathy. No prevertebral soft tissue edema. Right central line, partially included.  IMPRESSION: 1. No acute intracranial abnormality. 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 No acute or superimposed hemorrhage. 2. Degenerative and postsurgical change in the cervical spine. No acute fracture or subluxation.   Electronically Signed  By: Rubye Oaks M.D.   On: 02/07/2015 23:08    Ct Cervical Spine Wo Contrast  02/07/2015   CLINICAL DATA:  Unwitnessed fall from bed to floor. Unknown loss of consciousness. Headache and agitation.  EXAM: CT HEAD WITHOUT CONTRAST  CT CERVICAL SPINE WITHOUT CONTRAST  TECHNIQUE: Multidetector CT imaging of the head and cervical spine was performed following the standard protocol without intravenous contrast. Multiplanar CT image reconstructions of the cervical spine were also generated.  COMPARISON:  CT 02/03/2015, MRI 02/05/2015  FINDINGS: CT HEAD FINDINGS  Symmetric hypodensity in the posterior occipital lobes, unchanged in appearance and distribution from prior exam. No superimposed hemorrhage. There is no intracranial hemorrhage or fluid collection. No CT findings of acute infarct. The ventricles and extra-axial CSF spaces are unchanged, no hydrocephalus. Diffuse opacification of the mastoid air cells, unchanged. Scattered mucosal thickening of the ethmoid air cells. No calvarial fracture.  CT CERVICAL SPINE FINDINGS  Anterior fusion C5 through C7 with interbody spacers. There is bony fusion of C4-C5 vertebral bodies. No hardware complication. Straightening of normal cervical lordosis. No listhesis. No acute fracture. Vertebral body heights are maintained. The dens is intact. Lateral masses of C1 well aligned on C2. Scattered facet arthropathy. No prevertebral soft tissue edema. Right central line, partially included.  IMPRESSION: 1. No acute intracranial abnormality. 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 No acute or superimposed hemorrhage. 2. Degenerative and postsurgical change in the cervical spine. No acute fracture or subluxation.   Electronically Signed   By: Rubye Oaks M.D.   On: 02/07/2015 23:08   Dg Chest Port 1 View  02/08/2015   CLINICAL DATA:  Respiratory failure.  EXAM: PORTABLE CHEST - 1 VIEW  COMPARISON:  02/07/2015.  FINDINGS: The heart is enlarged but stable. The endotracheal tube and  NG tubes have been removed. The right IJ catheter is unchanged. Slight improved basilar lung aeration. No edema or effusions.  IMPRESSION: Interval removal of the ET tube and NG tube.  Stable cardiac enlargement but slight improved bibasilar lung aeration and no pleural effusions or pulmonary edema.   Electronically Signed   By: Rudie Meyer M.D.   On: 02/08/2015 08:09   Dg Chest Port 1 View  02/07/2015   CLINICAL DATA:  Acute respiratory failure  EXAM: PORTABLE CHEST - 1 VIEW  COMPARISON:  the previous day's study  FINDINGS: Endotracheal tube, nasogastric tube, and tunneled right IJ hemodialysis catheter are stable in position. Bibasilar atelectasis left greater than right, stable. Probable small pleural effusions. Mild cardiomegaly. No pneumothorax. Cervical fixation hardware partially seen.  IMPRESSION: 1. Small effusions and bibasilar atelectasis/consolidation, left greater than right, as before. 2.  Support hardware stable in position.   Electronically Signed   By: Corlis Leak M.D.   On: 02/07/2015 08:57    Labs: BMET  Recent Labs Lab 02/02/15 0406 02/03/15 0448 02/03/15 1415 02/03/15 1610 02/04/15 0238 02/05/15 0250 02/06/15 0236 02/07/15 0249 02/08/15 0323  NA 136 138 QUESTIONABLE RESULTS, RECOMMEND RECOLLECT TO VERIFY 137 140 139 139  138 143 143  K 3.9 4.0  --  4.6 4.0 3.9 3.9  3.9 3.5 3.8  CL 98* 102  --  101 103 101 103  101 106 108  CO2 27 26  --  19* GLUCOSE 96 97  --  162* 114* 92 83  82 100* 101*  BUN 58* 62*  --  67* 46* 52* 34*  34* 44* 31*  CREATININE 12.21* 12.45*  --  13.21* 8.49* 9.55* 6.40*  6.34* 6.36* 4.15*  CALCIUM 7.9* 8.2*  --  8.1* 8.2* 7.9* 7.7*  7.6* 7.7* 7.9*  PHOS 6.1* 6.2*  --   --  3.3 6.8* 4.9* 4.6 5.3*   CBC  Recent Labs Lab 02/05/15 0250 02/06/15 0236 02/07/15 0249 02/08/15 0323  WBC 9.9 8.8 6.6 11.2*  NEUTROABS 8.3* 7.2 4.8 9.7*  HGB 8.7* 8.8* 8.5* 9.6*  HCT 26.6* 27.2* 25.9* 29.5*  MCV 89.6 90.7 91.5 91.0   PLT 135* 105* 87* 106*   Medications:    . amLODipine  10 mg Oral Daily  . antiseptic oral rinse  7 mL Mouth Rinse QID  . aspirin  81 mg Per Tube Daily  . chlorhexidine gluconate  15 mL Mouth Rinse BID  . darbepoetin (ARANESP) injection - DIALYSIS  100 mcg Intravenous Q Mon-HD  . heparin subcutaneous  5,000 Units Subcutaneous 3 times per day  . hydrALAZINE  10 mg Oral 4 times per day  . lacosamide (VIMPAT) IV  100 mg Intravenous Q12H  . lanthanum  500 mg Oral TID WC  . levETIRAcetam  1,000 mg Intravenous Q12H  . multivitamin with minerals  1 tablet Oral Daily  . nebivolol  5 mg Oral Daily  . pantoprazole sodium  40 mg Per Tube Daily  . sodium chloride  10 mL Intravenous Q12H   Zetta Bills, MD 02/08/2015, 8:30 AM

## 2015-02-08 NOTE — Progress Notes (Signed)
RN was told by [facility] nurse that pt didn't require isolation precaution per Dr. Jamison Neighbor.

## 2015-02-08 NOTE — Progress Notes (Signed)
eLink Physician-Brief Progress Note Patient Name: Jared [name] DOB: 05/29/1955 MRN: [id number]   Date of Service  02/08/2015  HPI/Events of Note  Wife requested dnr and felt to be appropriate p review of record  eICU Interventions  dnr status / no escalation of care/ can discuss other limitations of care with bedside team in am     Intervention Category Major Interventions: End of life / care limitation discussion  Sandrea Hughs 02/08/2015, 5:19 PM

## 2015-02-08 NOTE — Progress Notes (Signed)
PULMONARY / CRITICAL CARE MEDICINE   Name: Jared [name] MRN: [id number] DOB: 1955/10/15    ADMISSION DATE:  01/08/2015 CONSULTATION DATE:  8/18  REFERRING MD :  EDP Dr Criss Alvine  CHIEF COMPLAINT:  Found down  BRIEF DESCRIPTION:   59 yo male had severe abdominal pain, and then found unresponsive at home.  In ER he was in shock, cyanotic, with lactic acidosis, and hyperkalemia. Initial suspicion was for PE, however VQ was low prob. Etiology for such acute deterioration remains unclear, but is presumed to be occult septic shock by exclusion of PE, MI, bowel ischemia. Has been recovering, however has required continued dialysis. 9/13 suffered respiratory arrest and seizures requiring intubation and ICU transfer. Transferred to the floor. Admit back to ICU on 9/13 after seizures, PRES syndrome. Intubated for resp failure.   STUDIES:  8/18 Echo > EF 45 to 50%, mod RV dilation, mod/severe RV dysfx, PAS 47 mmHg 8/18 Renal u/s >> b/l renal echogenicity 8/23 LHC - left circumflex, second obtuse marginal branch 100% occlusion  CT head/neck 9/18>> NEG acute   SIGNIFICANT EVENTS: 8/18 Admit, CCS, cardiology consulted; brief PEA arrest; laparotomy with placement of wound vac; started heparin gtt for presumed PE 8/19 Renal consulted. Duplex LE negative for DVT 8/20 CRRT  8/21 OR for abd wound closure  8/23  Ileus, vanc stopped 8/26  Tolerating intemittent HD - oliguric. Vanc Stopped 8/27  hypertensive - on hydralazine prn. Anuric. Being weaned off dilaudid.  8/28  Extubated  TPN.  9/5 to tele 9/13 Seizure, resp arrest > intubated > to ICU   SUBJECTIVE/OVERNIGHT:   Fell OOB overnight while agitated, CT head neg. Remains intermittently very agitated, improved on precedex.  Low grade fever.   VITAL SIGNS: Temp:  [98.8 F (37.1 C)-99.4 F (37.4 C)] 99.2 F (37.3 C) (09/18 1151) Pulse Rate:  [29-87] 64 (09/18 1230) Resp:  [12-32] 14 (09/18 1230) BP: (123-177)/(63-106) 132/66 mmHg (09/18  1230) SpO2:  [89 %-100 %] 98 % (09/18 1230) Weight:  [257 lb 15 oz (117 kg)] 257 lb 15 oz (117 kg) (09/18 0500) HEMODYNAMICS:   VENTILATOR SETTINGS:   INTAKE / OUTPUT:  Intake/Output Summary (Last 24 hours) at 02/08/15 1238 Last data filed at 02/08/15 1230  Gross per 24 hour  Intake 1773.86 ml  Output   3060 ml  Net -1286.14 ml   Total I/O In: 353.5 [P.O.:60; I.V.:148.5; IV Piggyback:145] Out: 1000 [Urine:1000]  I/O last 3 completed shifts: In: 3088.3 [I.V.:2085.8; Other:40; NG/GT:547.5; IV Piggyback:415] Out: 4720 [Urine:3720; Other:1000]  PHYSICAL EXAMINATION: General: sedated on vent, getting HD, NAD Neuro: sedated on precedex, RASS -1, easily agitated per nursing    HEENT: Moist mucus membranes. No JVD. Cardiovascular: RRR, no MRG. Lungs: resps even non labored [place], Coarse breath sounds. Abdomen: Obese, soft, surgical dressing in place.  Musculoskeletal: No acute deformity.  LABS:  PULMONARY  Recent Labs Lab 02/03/15 1425 02/03/15 1748 02/04/15 1315 02/05/15 0420 02/07/15 0500  PHART 7.069* 7.565* 7.453* 7.486* 7.467*  PCO2ART 36.2 26.4* 37.5 32.7* 35.9  PO2ART 141.0* 82.0 57.0* 102* 107*  HCO3 10.4* 23.9 26.2* 24.4* 25.6*  TCO2 25.4 26.7  O2SAT 98.0 98.0 90.0 97.9 98.2    CBC  Recent Labs Lab 02/06/15 0236 02/07/15 0249 02/08/15 0323  HGB 8.8* 8.5* 9.6*  HCT 27.2* 25.9* 29.5*  WBC 8.8 6.6 11.2*  PLT 105* 87* 106*   COAGULATION No results for input(s): INR in the last 168 hours.  CARDIAC    Recent  Labs Lab 02/03/15 1610 02/03/15 2010 02/04/15 0238  TROPONINI 1.12* 1.99* 1.54*   No results for input(s): PROBNP in the last 168 hours.  CHEMISTRY  Recent Labs Lab 02/04/15 0238 02/05/15 0250 02/06/15 0236 02/07/15 0249 02/08/15 0323  NA 140 139 139  138 143 143  K 4.0 3.9 3.9  3.9 3.5 3.8  CL 103 101 103  101 106 108  CO2 GLUCOSE 114* 92 83  82 100* 101*  BUN 46* 52* 34*  34* 44* 31*   CREATININE 8.49* 9.55* 6.40*  6.34* 6.36* 4.15*  CALCIUM 8.2* 7.9* 7.7*  7.6* 7.7* 7.9*  MG 2.2 2.4 2.1 2.0 1.8  PHOS 3.3 6.8* 4.9* 4.6 5.3*   Estimated Creatinine Clearance: 25.6 mL/min (by C-G formula based on Cr of 4.15).  LIVER  Recent Labs Lab 02/03/15 1610 02/04/15 0238 02/05/15 0250 02/06/15 0236 02/08/15 0323  AST 48*  --   --   --   --   ALT 12*  --   --   --   --   ALKPHOS 108  --   --   --   --   BILITOT 1.0  --   --   --   --   PROT 5.6*  --   --   --   --   ALBUMIN 2.1* 2.1* 2.0* 2.1* 2.2*   INFECTIOUS  Recent Labs Lab 02/03/15 2010 02/04/15 0238 02/04/15 0846 02/05/15 0250  LATICACIDVEN 1.4 0.9 1.2  --   PROCALCITON 11.75 21.41  --  25.19   ENDOCRINE CBG (last 3)   Recent Labs  02/07/15 2343 02/08/15 0338 02/08/15 0912  GLUCAP 96 97 98   IMAGING   ASSESSMENT / PLAN:  PULMONARY ETT 8/18 >8/28, 9/13 >>>9/18 A: Acute respiratory failure in setting seizure vs aspiration OSA ? PE essentailly ruled out with neg DVT and low prob VQ prior in admit P:   Pulmonary hygiene as able  F/u CXR  Supplemental O2 as needed   CARDIOVASCULAR Rt femoral CVL 8/18 >>8/20 Lt femoral A line 8/18 >>8/28 RIJ HD 8/20 >out LIJ 8/20 >? RIJ perm cath >>> A:  Hypertenson Elevated lactic acid > suspect due to siezure Shock > Resolved. Likely RV failure with possible PE.-although venous doppler neg for DVT  X Cath 8/06/25/14 OM branch occlusion per report and unlikely cause of admission RV failure AFRVR - resolved P:  Telemetry monitoring Continue nebivolol, norvasc  PRN hydralazine  Volume removal per HD   RENAL A: ESRD new onset . Was on CRRT now intermittent HD dependent, - r/t septic shock related ATN- RF and pan autoimmune negative 01/14/15.  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  P:   Vanc completed 01/14/15 Zosyn stopped 01/17/15  Continue Observe off antibiotics. Consider further cultures +/- empiric abx if has further fevers or climbing wbc   ENDOCRINE A: Hyperglycemia  P:   CBG and SSI   NEUROLOGIC A: Status epilepticus PRES syndrome. Acute encephalopathy. Metabolic + seizure component Hx  chronic back pain, anxiety, depression. Agitation  Elevated triglycerides  P:   RASS goal: 0 On Keppra and Vimpat Seizure treatment per Neuro MRI brain c/w PRES Holding outpt xanax, celexa, oxycodone, lyrica  Continue precedex and wean off as able    Updates: wife updated 9/18   Dirk Dress, NP 02/08/2015  12:38 PM Pager: [phone] or [phone]   Attending Note:  I have examined patient, reviewed labs, studies and notes. I have discussed the case with Jasper Riling, and I agree with the data and plans as amended above. Delirium s/p seizure, PRES and resultant VDRF. Extubated 9/18. On my eval looks comfortable. Per nursing he has had outbursts of agitation. Remains on precedex. Will attempt to wean to off, continue tight BP control, continue vimpat.  Independent critical care time is 35 minutes.   Levy Pupa, MD, PhD 02/08/2015, 3:06 PM [facility] [phone] or if no answer [phone]

## 2015-02-09 DIAGNOSIS — R109 Unspecified abdominal pain: Secondary | ICD-10-CM | POA: Insufficient documentation

## 2015-02-09 DIAGNOSIS — J9600 Acute respiratory failure, unspecified whether with hypoxia or hypercapnia: Secondary | ICD-10-CM

## 2015-02-09 DIAGNOSIS — R1010 Upper abdominal pain, unspecified: Secondary | ICD-10-CM

## 2015-02-09 LAB — URINALYSIS, ROUTINE W REFLEX MICROSCOPIC
Bilirubin Urine: NEGATIVE
GLUCOSE, UA: NEGATIVE mg/dL
Hgb urine dipstick: NEGATIVE
KETONES UR: NEGATIVE mg/dL
NITRITE: NEGATIVE
PROTEIN: 30 mg/dL — AB
Specific Gravity, Urine: 1.013 (ref 1.005–1.030)
UROBILINOGEN UA: 0.2 mg/dL (ref 0.0–1.0)
pH: 7 (ref 5.0–8.0)

## 2015-02-09 LAB — CBC WITH DIFFERENTIAL/PLATELET
BASOS PCT: 0 %
Basophils Absolute: 0.1 10*3/uL (ref 0.0–0.1)
Eosinophils Absolute: 0.2 10*3/uL (ref 0.0–0.7)
Eosinophils Relative: 1 %
HEMATOCRIT: 29.6 % — AB (ref 39.0–52.0)
HEMOGLOBIN: 9.5 g/dL — AB (ref 13.0–17.0)
LYMPHS ABS: 0.9 10*3/uL (ref 0.7–4.0)
Lymphocytes Relative: 5 %
MCH: 29 pg (ref 26.0–34.0)
MCHC: 32.1 g/dL (ref 30.0–36.0)
MCV: 90.2 fL (ref 78.0–100.0)
MONO ABS: 0.7 10*3/uL (ref 0.1–1.0)
MONOS PCT: 4 %
NEUTROS ABS: 17.5 10*3/uL — AB (ref 1.7–7.7)
NEUTROS PCT: 90 %
Platelets: 142 10*3/uL — ABNORMAL LOW (ref 150–400)
RBC: 3.28 MIL/uL — ABNORMAL LOW (ref 4.22–5.81)
RDW: 14.6 % (ref 11.5–15.5)
WBC: 19.4 10*3/uL — ABNORMAL HIGH (ref 4.0–10.5)

## 2015-02-09 LAB — RENAL FUNCTION PANEL
ANION GAP: 11 (ref 5–15)
Albumin: 2.2 g/dL — ABNORMAL LOW (ref 3.5–5.0)
BUN: 40 mg/dL — ABNORMAL HIGH (ref 6–20)
CALCIUM: 8.1 mg/dL — AB (ref 8.9–10.3)
CHLORIDE: 107 mmol/L (ref 101–111)
CO2: 22 mmol/L (ref 22–32)
Creatinine, Ser: 4.41 mg/dL — ABNORMAL HIGH (ref 0.61–1.24)
GFR calc non Af Amer: 13 mL/min — ABNORMAL LOW (ref >60)
GFR, EST AFRICAN AMERICAN: 15 mL/min — AB (ref >60)
GLUCOSE: 91 mg/dL (ref 65–99)
POTASSIUM: 3.7 mmol/L (ref 3.5–5.1)
Phosphorus: 4.9 mg/dL — ABNORMAL HIGH (ref 2.5–4.6)
Sodium: 140 mmol/L (ref 135–145)

## 2015-02-09 LAB — URINE MICROSCOPIC-ADD ON

## 2015-02-09 LAB — GLUCOSE, CAPILLARY
Glucose-Capillary: 87 mg/dL (ref 65–99)
Glucose-Capillary: 92 mg/dL (ref 65–99)
Glucose-Capillary: 91 mg/dL (ref 65–99)
Glucose-Capillary: 94 mg/dL (ref 65–99)
Glucose-Capillary: 89 mg/dL (ref 65–99)
Glucose-Capillary: 91 mg/dL (ref 65–99)

## 2015-02-09 LAB — MAGNESIUM: Magnesium: 1.8 mg/dL (ref 1.7–2.4)

## 2015-02-09 MED ORDER — MAGNESIUM SULFATE 50 % IJ SOLN
1.0000 g | Freq: Once | INTRAVENOUS | Status: DC
  Filled 2015-02-09: qty 2

## 2015-02-09 MED ORDER — MAGNESIUM SULFATE IN D5W 10-5 MG/ML-% IV SOLN
1.0000 g | Freq: Once | INTRAVENOUS | Status: AC
  Administered 2015-02-09: 1 g via INTRAVENOUS
  Filled 2015-02-09: qty 100

## 2015-02-09 MED ORDER — CETYLPYRIDINIUM CHLORIDE 0.05 % MT LIQD
7.0000 mL | Freq: Two times a day (BID) | OROMUCOSAL | Status: DC
  Administered 2015-02-09 – 2015-02-11 (×4): 7 mL via OROMUCOSAL

## 2015-02-09 MED ORDER — ACETAMINOPHEN 650 MG RE SUPP
650.0000 mg | Freq: Four times a day (QID) | RECTAL | Status: DC | PRN
  Administered 2015-02-09: 650 mg via RECTAL
  Filled 2015-02-09: qty 1

## 2015-02-09 MED ORDER — VANCOMYCIN HCL 10 G IV SOLR
2500.0000 mg | Freq: Once | INTRAVENOUS | Status: AC
  Administered 2015-02-09: 2500 mg via INTRAVENOUS
  Filled 2015-02-09: qty 2500

## 2015-02-09 MED ORDER — HYDRALAZINE HCL 25 MG PO TABS
25.0000 mg | ORAL_TABLET | Freq: Three times a day (TID) | ORAL | Status: DC
  Administered 2015-02-09 – 2015-02-11 (×7): 25 mg via ORAL
  Filled 2015-02-09 (×10): qty 1

## 2015-02-09 MED ORDER — VANCOMYCIN HCL IN DEXTROSE 1-5 GM/200ML-% IV SOLN
1000.0000 mg | INTRAVENOUS | Status: DC
  Filled 2015-02-09: qty 200

## 2015-02-09 MED ORDER — PIPERACILLIN-TAZOBACTAM IN DEX 2-0.25 GM/50ML IV SOLN
2.2500 g | Freq: Three times a day (TID) | INTRAVENOUS | Status: DC
  Administered 2015-02-09 – 2015-02-11 (×5): 2.25 g via INTRAVENOUS
  Filled 2015-02-09 (×7): qty 50

## 2015-02-09 MED ORDER — DARBEPOETIN ALFA 100 MCG/0.5ML IJ SOSY
100.0000 ug | PREFILLED_SYRINGE | INTRAMUSCULAR | Status: DC
  Filled 2015-02-09: qty 0.5

## 2015-02-09 MED FILL — Medication: Qty: 1 | Status: AC

## 2015-02-09 NOTE — Progress Notes (Signed)
ANTIBIOTIC CONSULT NOTE - INITIAL  Pharmacy Consult for Zosyn and vancomycin Indication: fever and elevated WBC with unknown source  Allergies  Allergen Reactions  . Morphine And Related Anaphylaxis and Shortness Of Breath  . Montelukast Other (See Comments)    unknown  . Neurontin [Gabapentin] Other (See Comments)    delusional  . Wellbutrin [Bupropion] Palpitations    Insomnia    Patient Measurements: Weight: 262 lb 5.6 oz (119 kg)   Vital Signs: Temp: 101.5 F (38.6 C) (09/19 2030) Temp Source: Oral (09/19 2030) BP: 152/61 mmHg (09/19 2000) Pulse Rate: 76 (09/19 2000) Intake/Output from previous day: 09/18 0701 - 09/19 0700 In: 1941.2 [P.O.:610; I.V.:1041.2; IV Piggyback:290] Out: 3425 [Urine:3425] Intake/Output from this shift: Total I/O In: 45.8 [I.V.:45.8] Out: -   Labs:  Recent Labs  02/07/15 0249 02/08/15 0323 02/09/15 0232  WBC 6.6 11.2* 19.4*  HGB 8.5* 9.6* 9.5*  PLT 87* 106* 142*  CREATININE 6.36* 4.15* 4.41*   Estimated Creatinine Clearance: 24.3 mL/min (by C-G formula based on Cr of 4.41). No results for input(s): VANCOTROUGH, VANCOPEAK, VANCORANDOM, GENTTROUGH, GENTPEAK, GENTRANDOM, TOBRATROUGH, TOBRAPEAK, TOBRARND, AMIKACINPEAK, AMIKACINTROU, AMIKACIN in the last 72 hours.   Microbiology: Recent Results (from the past 720 hour(s))  Culture, blood (routine x 2)     Status: None   Collection Time: 01/20/15  3:48 PM  Result Value Ref Range Status   Specimen Description BLOOD PORTA CATH  Final   Special Requests BOTTLES DRAWN AEROBIC AND ANAEROBIC  Final   Culture NO GROWTH 5 DAYS  Final   Report Status 01/25/2015 FINAL  Final  Culture, blood (routine x 2)     Status: None   Collection Time: 01/20/15  4:25 PM  Result Value Ref Range Status   Specimen Description BLOOD PORTA CATH  Final   Special Requests BOTTLES DRAWN AEROBIC AND ANAEROBIC  Final   Culture NO GROWTH 5 DAYS  Final   Report Status 01/25/2015 FINAL  Final  Culture,  blood (routine x 2)     Status: None   Collection Time: 01/22/15  3:25 PM  Result Value Ref Range Status   Specimen Description BLOOD LEFT ARM  Final   Special Requests BOTTLES DRAWN AEROBIC ONLY 5CC  Final   Culture NO GROWTH 5 DAYS  Final   Report Status 01/27/2015 FINAL  Final  Culture, blood (routine x 2)     Status: None   Collection Time: 01/22/15  3:30 PM  Result Value Ref Range Status   Specimen Description BLOOD RIGHT ARM  Final   Special Requests BOTTLES DRAWN AEROBIC ONLY 2CC  Final   Culture NO GROWTH 5 DAYS  Final   Report Status 01/27/2015 FINAL  Final  Culture, Urine     Status: None   Collection Time: 01/22/15  6:03 PM  Result Value Ref Range Status   Specimen Description URINE, CATHETERIZED  Final   Special Requests NONE  Final   Culture NO GROWTH 1 DAY  Final   Report Status 01/23/2015 FINAL  Final  Culture, blood (routine x 2)     Status: None   Collection Time: 01/29/15 10:45 PM  Result Value Ref Range Status   Specimen Description BLOOD RIGHT ANTECUBITAL  Final   Special Requests   Final    BOTTLES DRAWN AEROBIC AND ANAEROBIC 10CC POF ON ANCEF   Culture NO GROWTH 5 DAYS  Final   Report Status 02/03/2015 FINAL  Final  Culture, blood (routine x 2)  Status: None   Collection Time: 01/29/15 11:00 PM  Result Value Ref Range Status   Specimen Description BLOOD LEFT ANTECUBITAL  Final   Special Requests   Final    BOTTLES DRAWN AEROBIC AND ANAEROBIC 10CC POF ON ANCEF   Culture NO GROWTH 5 DAYS  Final   Report Status 02/03/2015 FINAL  Final  C difficile quick scan w PCR reflex     Status: Abnormal   Collection Time: 02/07/15  3:11 PM  Result Value Ref Range Status   C Diff antigen POSITIVE (A) NEGATIVE Final   C Diff toxin NEGATIVE NEGATIVE Final   C Diff interpretation   Final    C. difficile present, but toxin not detected. This indicates colonization. In most cases, this does not require treatment. If patient has signs and symptoms consistent with  colitis, consider treatment.   Assessment: 37 YOM admitted on January 08, 2015 after being found down at home. He has AoCKD, possibly ESRD d/t septic shock related to ATN. He was on CRRT earlier in the admission, but has since transitioned to [facility]- currently a TTSat schedule. Patient does make urine, however it is unclear how much filtering is occuring.  He was on vancomycin previously. Last dose was given on 9/1. A level of 24mcg/mL resulted on 9/3. Since that level, the patient has had 6 HD sessions which would mean vancomycin is nearly completely cleared from his system based on rough calculations.  Patient suffered respiratory arrest and seizures on 9/13 and was transferred back to the ICU. He is currently intubated.  His WBC increased again to 19.4, and spiked a fever of 101.5 this evening. To restart antibiotics d/t unknown source of infection. Blood and urine cultures have been ordered.  Orders are entered for HD tomorrow.  Goal of Therapy:  pre-HD vancomycin level 15-5mcg/mL  Plan:  -vancomycin 2500mg  IV x1 as loading dose, then 1g IV qHD -Zosyn 2.25g IV q8h -follow HD schedule, UOP, level PRN -follow c/s, clinical progression, LOT  Lauren D. Bajbus, PharmD, BCPS Clinical Pharmacist Pager: [phone] 02/09/2015 9:03 PM

## 2015-02-09 NOTE — Progress Notes (Signed)
PT Cancellation Note  Patient Details Name: Jared [name] MRN: [id number] DOB: 1956-05-12   Cancelled Treatment:    Reason Eval/Treat Not Completed: Medical issues which prohibited therapy. Discussed pt case with RN who states that pt continues to be aggressive and agitated this morning. She does not feel he is appropriate for therapy at this time and asks that PT return tomorrow to reassess appropriateness to participate. Will continue to follow and progress as able per POC.    Conni Slipper 02/09/2015, 10:29 AM   Conni Slipper, PT, DPT Acute Rehabilitation Services Pager: [phone]

## 2015-02-09 NOTE — Progress Notes (Signed)
Subjective: No further seizures noted.  On Keppra and Vimpat.  Patient extubated and on Precedex for control of agitation.    Objective: Current vital signs: BP 148/62 mmHg  Pulse 70  Temp(Src) 98.5 F (36.9 C) (Oral)  Resp 30  Wt 119 kg (262 lb 5.6 oz)  SpO2 95% Vital signs in last 24 hours: Temp:  [98.5 F (36.9 C)-100.2 F (37.9 C)] 98.5 F (36.9 C) (09/19 1218) Pulse Rate:  [57-81] 70 (09/19 1030) Resp:  [12-30] 30 (09/19 1030) BP: (134-166)/(55-70) 148/62 mmHg (09/19 1030) SpO2:  [95 %-98 %] 95 % (09/19 1030) Weight:  [119 kg (262 lb 5.6 oz)] 119 kg (262 lb 5.6 oz) (09/19 0500)  Intake/Output from previous day: 09/18 0701 - 09/19 0700 In: 1941.2 [P.O.:610; I.V.:1041.2; IV Piggyback:290] Out: 3425 [Urine:3425] Intake/Output this shift: Total I/O In: 339.9 [P.O.:300; I.V.:39.9] Out: 850 [Urine:850] Nutritional status: Diet NPO time specified  Neurologic Exam: Mental Status: Patient sleeping but easily awakened. Must be continually awakened throughout the examination.  Follows simple commands.   Cranial Nerves: II: Pupils equal, round, reactive to light and accommodation III,IV, VI: Extra-ocular motions grossly intact bilaterally V,VII: No facial asymmetry noted VIII: hearing normal bilaterally Motor: Moves all extremities to command Sensory: Responds to light, tactile stimuli throughout   Lab Results: Basic Metabolic Panel:  Recent Labs Lab 02/05/15 0250 02/06/15 0236 02/07/15 0249 02/08/15 0323 02/09/15 0232  NA 139 139  138 143 143 140  K 3.9 3.9  3.9 3.5 3.8 3.7  CL 101 103  101 106 108 107  CO2 25 26  25 27 26 22   GLUCOSE 92 83  82 100* 101* 91  BUN 52* 34*  34* 44* 31* 40*  CREATININE 9.55* 6.40*  6.34* 6.36* 4.15* 4.41*  CALCIUM 7.9* 7.7*  7.6* 7.7* 7.9* 8.1*  MG 2.4 2.1 2.0 1.8 1.8  PHOS 6.8* 4.9* 4.6 5.3* 4.9*    Liver Function Tests:  Recent Labs Lab 02/03/15 1610 02/04/15 0238 02/05/15 0250 02/06/15 0236 02/08/15 0323  02/09/15 0232  AST 48*  --   --   --   --   --   ALT 12*  --   --   --   --   --   ALKPHOS 108  --   --   --   --   --   BILITOT 1.0  --   --   --   --   --   PROT 5.6*  --   --   --   --   --   ALBUMIN 2.1* 2.1* 2.0* 2.1* 2.2* 2.2*   No results for input(s): LIPASE, AMYLASE in the last 168 hours. No results for input(s): AMMONIA in the last 168 hours.  CBC:  Recent Labs Lab 02/05/15 0250 02/06/15 0236 02/07/15 0249 02/08/15 0323 02/09/15 0232  WBC 9.9 8.8 6.6 11.2* 19.4*  NEUTROABS 8.3* 7.2 4.8 9.7* 17.5*  HGB 8.7* 8.8* 8.5* 9.6* 9.5*  HCT 26.6* 27.2* 25.9* 29.5* 29.6*  MCV 89.6 90.7 91.5 91.0 90.2  PLT 135* 105* 87* 106* 142*    Cardiac Enzymes:  Recent Labs Lab 02/03/15 1610 02/03/15 2010 02/04/15 0238  TROPONINI 1.12* 1.99* 1.54*    Lipid Panel:  Recent Labs Lab 02/03/15 1415 02/04/15 0238 02/07/15 0249  TRIG 63 361* 606*    CBG:  Recent Labs Lab 02/08/15 1943 02/08/15 2343 02/09/15 0345 02/09/15 0839 02/09/15 1215  GLUCAP 96 87 92 91 94    Microbiology: Results for orders  placed or performed during the hospital encounter of 01/08/15  Blood Culture (routine x 2)     Status: None   Collection Time: 01/08/15 11:31 AM  Result Value Ref Range Status   Specimen Description BLOOD A-LINE  Final   Special Requests BOTTLES DRAWN AEROBIC AND ANAEROBIC 10CC  Final   Culture NO GROWTH 5 DAYS  Final   Report Status 01/13/2015 FINAL  Final  Blood Culture (routine x 2)     Status: None   Collection Time: 01/08/15 12:11 PM  Result Value Ref Range Status   Specimen Description BLOOD LEFT ANTECUBITAL  Final   Special Requests BOTTLES DRAWN AEROBIC AND ANAEROBIC 5CC  Final   Culture NO GROWTH 5 DAYS  Final   Report Status 01/13/2015 FINAL  Final  MRSA PCR Screening     Status: None   Collection Time: 01/08/15  3:30 PM  Result Value Ref Range Status   MRSA by PCR NEGATIVE NEGATIVE Final    Comment:        The GeneXpert MRSA Assay (FDA approved for  NASAL specimens only), is one component of a comprehensive MRSA colonization surveillance program. It is not intended to diagnose MRSA infection nor to guide or monitor treatment for MRSA infections.   Urine culture     Status: None   Collection Time: 01/08/15  4:39 PM  Result Value Ref Range Status   Specimen Description URINE, CATHETERIZED  Final   Special Requests NONE  Final   Culture NO GROWTH 2 DAYS  Final   Report Status 01/10/2015 FINAL  Final  Culture, blood (routine x 2)     Status: None   Collection Time: 01/20/15  3:48 PM  Result Value Ref Range Status   Specimen Description BLOOD PORTA CATH  Final   Special Requests BOTTLES DRAWN AEROBIC AND ANAEROBIC ValWnc Eye Surgery CenterCarie Cadd9[facility]07Psychologist, foreSwedish Medical Center - Cherry Hill CampusEpic Surgery Center nsValSkyline Ambulatory Surgery CCarie Cadd4[facility]23Psychologist, foreArrowhead Endoscopy And Pain Management Center LLCentra [facility] nsValMount Nittany Medical CCarie Cadd6[facility]17Psychologist, foreCalifornia Pacific Med Ctr-Davies CampusNorth Bend Med Ctr Day Surgerynsic1-5190ernalture NO GROWTH 5 DAYS  Final   Report Status 01/25/2015 FINAL  Final  Culture, blood (routine x 2)     Status: None  CValH B Magruder Memorial HosCarie Cadd5Grand Gi [facility] Endoscopy Group Inc01Psychologist, foreLas Vegas Surgicare LtdHighland Springs Hospital nsValStaten Island University Hospital - Carie Cadd8[facility]60Psychologist, foreDoctors Outpatient Surgery Center LLPerry Hospitalnsic2-3531ername: 01/20/15  4:25 PM  Result Value Ref Range Status   Specimen Desc riValBayfront Health Punta Carie Cadd(72[facility]6)Psychologist, foreGastroenterology Associates LLOgallala Community Hospitalnsic3-8353ernaPORTA CATH  Final   Special Requests BOTTLES DRAWN AEROBIC AND ANAEROBIC <MEASUREMENTD.R. HorGraciela HusbanSt. Luke'S [address]<MEASUREME NTValBaptist Surgery And Endoscopy CenterCarie Cadd(30Gross[facility] LP6)Psychologist, foreMedical Center EnterpriseGarfield Park Hospital, LLCnsic4-8069ernaaciela HusbanIntegris Dea57 Golden StKentuck yD.R. HorGraciela H usValKalispell Regional Medical CenteCarie Cadd7Mason Ridge Ambulatory Surgery Center Dba [facility]13Psychologist, foreMaryland Specialty Surgery Center LLHot Springs County Memorial Hospital nsValLea Regional Medical CCarie Cadd2[facility] - [facility] Hill 2 Center24Psychologist, foreMorledge Family Surgery CenterWest Shore Endoscopy Center LLC nsValThe Urology CenteCarie Cadd7[facility]40Psychologist, foreSurgery Center Of CaliforniaAmbulatory Surgical Pavilion At Robert Wood Johnson LLCnsic3-2849ernargical Cent[address] JeffersonK[address] yD.R. HorGraciela HusbanVirginia Mason Memorial Ho7725 GarKentuck yD.R. HorGraciela HusbanRochester Endoscopy Surgery Cent717 LibeKentuck yD.R. HorGraciela HuValSan Luis Valley Regional Medical CCarie Cadd6[facility] Division12Psychologist, foreSelect Specialty Hospital - YoungstownMahnomen Health Center nsValMountains Community HosCarie Cadd2[facility]15Psychologist, foreGastrointestinal Institute LLAvera Creighton Hospital nsValPark Bridge Rehabilitation And Wellness CCarie Cadd5[facility]07Psychologist, foreFallon Medical Complex HospitalTennova Healthcare - Clarksville nsValUintah Basin Care And RehabilitCarie CadMinnesota Eye Institute Surgery Center LLCd7Psychologist, foreRiverview Health InstituteCentro De Salud Comunal De Culebransic950345ernaermanente Baldwin Park Medical 39 YounKentuck<MEASUR EMValNorthern Virginia Surgery CenteCarie CadUniversity Of Wi Hospitals & Clinics Authorityd6Psychologist, foreSurgicare Of Jackson LtdShriners Hospital For Children nsValChildrens Hospital Of PhiladeCarie Cadd5Premier Ambulatory Surgery Center59Psychologist, foreWomen'S & Children'S HospitalThe Palmetto Surgery Centernsic9-7093ernarGraciela HusbanPresbyterian St Luke'S Medical [address] CounKentuck yD.R. HorGraciela HusbanWindhaven Psychiatric Ho425 HaKentuck yD.R. HorGraciela Husb anValMiddlesex Endoscopy CenteCarie Cadd(85Encompass Health Rehabilitation Hospital Of Midland/[place]essa4)Psychologist, foreBeaumont Hospital Royal OakPromenades Surgery Center LLC nsValEye Surgery Center Of Middle TennCarie Cadd(43Northeast Missouri Ambulatory Surgery Center LLC1)Psychologist, foreThe Endoscopy Center Of FairfieldCommunity Specialty Hospital nsValUchealth Grandview HosCarie Cadd5[facility]61Psychologist, forePresence Chicago Hospitals Network Dba Presence Saint Mary Of Nazareth Hospital CenterIowa City Va Medical Centernsic9-5292ernaehavioral [address] WhitmKentuck yD.R. HorGracie laValZambarano Memorial HosCarie Cadd8Lake'[facility] Cross[facility]62Psychologist, foreEndoscopy Center Of Topeka LPVa New Jersey Health Care Systemnsic0-2632ernancent427 RockawayKentuck yD.R. HorGraciela HusbanSummit Medical Group Pa D baValRobert Wood Johnson University Hospital At HamCarie Cadd(61[facility]2)Psychologist, foreOmega Surgery CenterNorthern California Surgery Center LP nsValNortheast Regional Medical CCarie Cadd6Rocky Mountain Surgery Center LLC18Psychologist, foreIowa Lutheran HospitalArizona State Forensic Hospital nsValCamden General HosCarie CadSelect Specialty Hospital - Wyandotte, LLCd6Psychologist, foreJames E Van Zandt Va Medical CenterTexas Endoscopy Centers LLC Dba Texas Endoscopynsic480971ernacal Group Ambulatory Surgery 30 BrKentuck yD.R. HorGraciela HusbanBarkley Surgicent508 [address] PKentuck yD.R. HorGraciela HusbanEndoscopy Center At Robinwo62 EuclKentuck yD.R. HorGraciela HusbanOr laValDoctors Same Day Surgery CenteCarie Cadd(9[facility]15Psychologist, foreNorth Bay Vacavalley HospitalBrown Cty Community Treatment Centernsic2-7190ernadic Outpatient Surgery Cent7080 WintergrKentuck yD.R. HorGr acValFairfax Community HosCarie Cadd7Bayfront Health Spring Hill54Psychologist, foreSutter Roseville Endoscopy CenterDoor County Medical Centernsic9-9716ernaore Institute Specialty Ho2C [address]. AshKentuck yD.R. HorGraciela HusbanMinnesota Eye Institute Surgery Cent877 DurhaKentuck yD.R. HorGraciela HusbanMilan General Ho81 Lake ForKentuck yD.R. HorGraciela HusbanBarnes-Jewish West County Ho179 BirchwoodKentuck yD.R. HorGraciela HusbanPennsylvania Ho653 CouKentuck yD.R. HorGraciela HusbanThe Universit y ValJay HosCarie Cadd2Tarboro Endoscopy Center LLC17Psychologist, foreBig South Fork Medical CenterBeaver Valley Hospital nsValChi Health Good SamaCarie Cadd4Saint Cam[facility] Center14Psychologist, foreRegional Hospital Of ScrantonEccs Acquisition Coompany Dba Endoscopy Centers Of [facility] Springs nsValEast Memphis Urology Center Dba UrocCarie Cadd(5Cornerstone Behavioral Health Hospital Of [facility]12Psychologist, foreLinden Surgical Center LLMalcom Randall Va Medical Centernsic6-8421ernaedical [address] AndoveKentuck yD.R. HorGracie laValGood Samaritan Regional Medical CCarie Cadd(2Holy Redeemer Ambulatory Surgery Center LLC15Psychologist, foreSeaford Endoscopy Center LLClayton Cataracts And Laser Surgery Centernsic3-0921ernara Health-St Anthony Ho952 VernonKentuck yD.R. HorGraciela HusbanScottsdale Healthcare Thompso8556 Green LakeKentuck< MEValHss Asc Of Manhattan Dba Hospital For Special SuCarie Cadd2St. [facility]10Psychologist, foreShore Ambulatory Surgical Center LLC Dba Jersey Shore Ambulatory Surgery CenterHosp Universitario Dr Ramon Ruiz Arnaunsic3-4922erna.R. HorGraciela HusbanJohn L Mcclellan Memorial Veterans Ho58 EdgefiKentuck yD.R. HorGraciela HusbanIdaho Eye Center R37 FrankKe ntValBuffalo General Medical CCarie Cadd5[facility]73Psychologist, foreThe Oregon ClinicBath County Community Hospital nsValSharp Memorial HosCarie Cadd8T[facility]25Psychologist, foreDreyer Medical Ambulatory Surgery CenterMckay-Dee Hospital Center nsValWashington County HosCarie Cadd(4Meridian Plastic Surgery Center09Psychologist, foreSummit Surgery Centere St Marys GalenaMeritus Medical Centernsic5-1370ernaENT>yD.R. HorGraciela HusbanGood Hope Ho209 MeadoKentuck yD.R. HorGraciela HusbanForest Health Medical 7919 MayflowKentuck yD.R. HorGraciela HusbanLewisgale Hospital All21 Rock CrKentuck yD.R. HorGraciela HusbanTelecare Willow Rock 258 Cherry HiKentuck yD.R. HorGraciela HusbanColumbus Com7280 FremoKentuckynt RoDionne Bucyre NO GROWTH 5 DAYS  Final   Report Status 01/25/2015 FINAL  Final  Culture, blood (routine x 2)     Status: None   Collection Time: 01/22/15  3:25 PM  Result Value Ref Range Status   Specimen Description BLOOD LEFT ARM  Final   Special Requests BOTTLES DRAWN AEROBIC ONLY 5CC  Final   Culture NO GROWTH 5 DAYS  Final   Report Status 01/27/2015 FINAL  Final  Culture, blood (routine x 2)     Status: None   Collection Time: 01/22/15  3:30 PM  Result Value Ref Range Status   Specimen Description BLOOD RIGHT ARM  Final   Special Requests BOTTLES DRAWN AEROBIC ONLY 2CC  Final   Culture NO GROWTH 5 DAYS  Final   Report Status 01/27/2015 FINAL  Final  Culture, Urine     Status: None   Collection Time: 01/22/15  6:03 PM  Result Value Ref Range Status   Specimen Description URINE, CATHETERIZED  Final   Special Requests NONE  Final   Culture NO GROWTH 1 DAY  Final   Report Status 01/23/2015 FINAL  Final   Culture, blood (routine x 2)     Status: None   Collection Time: 01/29/15 10:45 PM  Result Value Ref Range Status   Specimen Description BLOOD RIGHT ANTECUBITAL  Final   Special Requests   Final    BOTTLES DRAWN AEROBIC AND ANAEROBIC 10CC POF ON ANCEF   Culture NO GROWTH 5 DAYS  Final   Report Status 02/03/2015 FINAL  Final  Culture, blood (routine x 2)     Status: None   Collection Time: 01/29/15 11:00 PM  Result Value Ref Range Status   Specimen Description BLOOD LEFT ANTECUBITAL  Final   Special Requests   Final    BOTTLES DRAWN AEROBIC AND ANAEROBIC 10CC POF ON ANCEF   Culture NO GROWTH 5 DAYS  Final   Report Status 02/03/2015 FINAL  Final  C difficile quick scan w PCR reflex     Status: Abnormal   Collection Time: 02/07/15  3:11 PM  Result Value Ref Range Status   C Diff antigen POSITIVE (A) NEGATIVE Final   C Diff toxin NEGATIVE NEGATIVE Final   C Diff interpretation   Final    C. difficile present, but toxin not detected. This indicates colonization. In most cases, this does not require treatment. If patient has signs and symptoms consistent with colitis, consider treatment.    Coagulation Studies: No results for input(s): LABPROT, INR in the last 72 hours.  Imaging: Ct Head Wo Contrast  02/07/2015   CLINICAL DATA:  Unwitnessed fall from bed to floor. Unknown loss of consciousness. Headache and agitation.  EXAM: CT HEAD WITHOUT CONTRAST  CT CERVICAL SPINE WITHOUT CONTRAST  TECHNIQUE: Multidetector CT imaging of the head and cervical spine was performed following the standard protocol without intravenous contrast. Multiplanar CT image reconstructions of the cervical spine were also generated.  COMPARISON:  CT 02/03/2015, MRI 02/05/2015  FINDINGS: CT HEAD FINDINGS  Symmetric hypodensity in the posterior occipital lobes, unchanged in appearance and distribution from prior exam. No superimposed hemorrhage. There is no intracranial hemorrhage or fluid collection. No CT findings of  acute infarct. The ventricles and extra-axial CSF spaces are unchanged, no hydrocephalus. Diffuse opacification of the mastoid air cells, unchanged. Scattered mucosal thickening of the ethmoid air cells. No calvarial fracture.  CT CERVICAL SPINE FINDINGS  Anterior fusion C5 through C7 with interbody spacers. There is bony fusion of C4-C5 vertebral bodies. No hardware complication. Straightening of normal cervical lordosis. No listhesis. No acute fracture. Vertebral body heights are maintained. The dens is intact. Lateral masses of C1 well aligned on C2. Scattered facet arthropathy. No prevertebral soft tissue edema. Right central line, partially included.  IMPRESSION: 1. No acute intracranial abnormality. 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 No acute or superimposed hemorrhage. 2. Degenerative and postsurgical change in the cervical spine. No acute fracture or subluxation.   Electronically Signed   By: Rubye Oaks M.D.   On: 02/07/2015 23:08   Ct Cervical Spine Wo Contrast  02/07/2015   CLINICAL DATA:  Unwitnessed fall from bed to floor. Unknown loss of consciousness. Headache and agitation.  EXAM: CT HEAD WITHOUT CONTRAST  CT CERVICAL SPINE WITHOUT CONTRAST  TECHNIQUE: Multidetector CT imaging of the head and cervical spine was performed following the standard protocol without intravenous contrast. Multiplanar CT image reconstructions of the cervical spine were also generated.  COMPARISON:  CT 02/03/2015, MRI 02/05/2015  FINDINGS: CT HEAD FINDINGS  Symmetric hypodensity in the posterior occipital lobes, unchanged in appearance and distribution from prior exam. No superimposed hemorrhage. There is no intracranial hemorrhage or fluid collection. No CT findings of acute infarct. The ventricles and extra-axial  CSF spaces are unchanged, no hydrocephalus. Diffuse opacification of the mastoid air cells, unchanged. Scattered mucosal thickening of the ethmoid air cells. No  calvarial fracture.  CT CERVICAL SPINE FINDINGS  Anterior fusion C5 through C7 with interbody spacers. There is bony fusion of C4-C5 vertebral bodies. No hardware complication. Straightening of normal cervical lordosis. No listhesis. No acute fracture. Vertebral body heights are maintained. The dens is intact. Lateral masses of C1 well aligned on C2. Scattered facet arthropathy. No prevertebral soft tissue edema. Right central line, partially included.  IMPRESSION: 1. No acute intracranial abnormality. 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 No acute or superimposed hemorrhage. 2. Degenerative and postsurgical change in the cervical spine. No acute fracture or subluxation.   Electronically Signed   By: Rubye Oaks M.D.   On: 02/07/2015 23:08   Dg Chest Port 1 View  02/08/2015   CLINICAL DATA:  Respiratory failure.  EXAM: PORTABLE CHEST - 1 VIEW  COMPARISON:  02/07/2015.  FINDINGS: The heart is enlarged but stable. The endotracheal tube and NG tubes have been removed. The right IJ catheter is unchanged. Slight improved basilar lung aeration. No edema or effusions.  IMPRESSION: Interval removal of the ET tube and NG tube.  Stable cardiac enlargement but slight improved bibasilar lung aeration and no pleural effusions or pulmonary edema.   Electronically Signed   By: Rudie Meyer M.D.   On: 02/08/2015 08:09    Medications:  Scheduled: . amLODipine  10 mg Oral Daily  . antiseptic oral rinse  7 mL Mouth Rinse QID  . aspirin  81 mg Per Tube Daily  . chlorhexidine gluconate  15 mL Mouth Rinse BID  . [START ON 02/10/2015] darbepoetin (ARANESP) injection - DIALYSIS  100 mcg Intravenous Q Tue-HD  . heparin subcutaneous  5,000 Units Subcutaneous 3 times per day  . hydrALAZINE  25 mg Oral 3 times per day  . lacosamide (VIMPAT) IV  100 mg Intravenous Q12H  . lanthanum  500 mg Oral TID WC  . levETIRAcetam  1,000 mg Intravenous Q12H  . multivitamin with minerals  1  tablet Oral Daily  . nebivolol  5 mg Oral Daily  . sodium chloride  10 mL Intravenous Q12H    Assessment/Plan: Patient with multiple medical issues, including new onset seizures.  No further seizures noted on Keppra and Vimpat.  Would continue.  Will continue to follow with you.   LOS: 32 days   Thana Farr, MD [facility] [phone] 02/09/2015  1:54 PM

## 2015-02-09 NOTE — Care Management Note (Signed)
Case Management Note  Patient Details  Name: Jared [name] MRN: [id number] Date of Birth: 05-May-1956  Subjective/Objective:       Received order for [facility] referral.  Both [facility] offered beds previously.  Called to talk with wife, Porfirio Mylar, but no answer, left message for her to call me back.              Action/Plan:   Expected Discharge Date:                  Expected Discharge Plan:  IP Rehab Facility  In-House Referral:     Discharge planning Services  CM Consult  Post Acute Care Choice:    Choice offered to:     DME Arranged:    DME Agency:     HH Arranged:    HH Agency:     Status of Service:  In process, will continue to follow  Medicare Important Message Given:  Yes-second notification given Date Medicare IM Given:    Medicare IM give by:    Date Additional Medicare IM Given:    Additional Medicare Important Message give by:     If discussed at Long Length of Stay Meetings, dates discussed:    Additional Comments:  Vangie Bicker, RN 02/09/2015, 2:45 PM

## 2015-02-09 NOTE — Progress Notes (Signed)
eLink Physician-Brief Progress Note Patient Name: Jared [name] DOB: [birth year] MRN: [id number]   Date of Service  02/09/2015  HPI/Events of Note  Asked to renew restraint order.  eICU Interventions  Restraint order renewed.      Intervention Category Minor Interventions: Routine modifications to care plan (e.g. PRN medications for pain, fever)  Sommer,Steven Eugene 02/09/2015, 10:08 PM

## 2015-02-09 NOTE — Progress Notes (Signed)
PULMONARY / CRITICAL CARE MEDICINE   Name: Jared [name] MRN: [id number] DOB: Dec 31, 1955    ADMISSION DATE:  01/08/2015 CONSULTATION DATE:  8/18  REFERRING MD :  EDP Dr Criss Alvine  CHIEF COMPLAINT:  Found down  BRIEF DESCRIPTION:   59 yo male had severe abdominal pain, and then found unresponsive at home.  In ER he was in shock, cyanotic, with lactic acidosis, and hyperkalemia. Initial suspicion was for PE, however VQ was low prob. Etiology for such acute deterioration remains unclear, but is presumed to be occult septic shock by exclusion of PE, MI, bowel ischemia. Has been recovering, however has required continued dialysis. 9/13 suffered respiratory arrest and seizures requiring intubation and ICU transfer. Transferred to the floor. Admit back to ICU on 9/13 after seizures, PRES syndrome. Intubated for resp failure.   STUDIES:  8/18 Echo > EF 45 to 50%, mod RV dilation, mod/severe RV dysfx, PAS 47 mmHg 8/18 Renal u/s >> b/l renal echogenicity 8/23 LHC - left circumflex, second obtuse marginal branch 100% occlusion  CT head/neck 9/18>> NEG acute   SIGNIFICANT EVENTS: 8/18 Admit, CCS, cardiology consulted; brief PEA arrest; laparotomy with placement of wound vac; started heparin gtt for presumed PE 8/19 Renal consulted. Duplex LE negative for DVT 8/20 CRRT  8/21 OR for abd wound closure  8/23  Ileus, vanc stopped 8/26  Tolerating intemittent HD - oliguric. Vanc Stopped 8/27  hypertensive - on hydralazine prn. Anuric. Being weaned off dilaudid.  8/28  Extubated  TPN.  9/5 to tele 9/13 Seizure, resp arrest > intubated > to ICU 9/17 extuibated 9/18-  Fell OOB overnight while agitated, CT head neg. Remains intermittently very agitated, improved on precedex.  Low grade fever.    SUBJECTIVE/OVERNIGHT:  precedex  VITAL SIGNS: Temp:  [99.2 F (37.3 C)-100.2 F (37.9 C)] 99.9 F (37.7 C) (09/19 0346) Pulse Rate:  [56-70] 65 (09/19 0600) Resp:  [13-28] 28 (09/19 0600) BP:  (123-169)/(55-77) 154/59 mmHg (09/19 0600) SpO2:  [95 %-99 %] 96 % (09/19 0600) Weight:  [119 kg (262 lb 5.6 oz)] 119 kg (262 lb 5.6 oz) (09/19 0500) HEMODYNAMICS:   VENTILATOR SETTINGS:   INTAKE / OUTPUT:  Intake/Output Summary (Last 24 hours) at 02/09/15 0726 Last data filed at 02/09/15 0600  Gross per 24 hour  Intake 1900.55 ml  Output   3425 ml  Net -1524.45 ml      I/O last 3 completed shifts: In: 2551.4 [P.O.:610; I.V.:1506.4; IV Piggyback:435] Out: 4710 [Urine:4710]  PHYSICAL EXAMINATION: General: sedated on vent, getting HD, NAD Neuro: sedated on precedex, RASS -2, min FC to none HEENT: Moist mucus membranes. No JVD. Cardiovascular: RRR, no MRG. Lungs: resps even non labored [place], Coarse breath sounds. Abdomen: Obese, soft, surgical dressing in place.  Musculoskeletal: No acute deformity.  LABS:  PULMONARY  Recent Labs Lab 02/03/15 1425 02/03/15 1748 02/04/15 1315 02/05/15 0420 02/07/15 0500  PHART 7.069* 7.565* 7.453* 7.486* 7.467*  PCO2ART 36.2 26.4* 37.5 32.7* 35.9  PO2ART 141.0* 82.0 57.0* 102* 107*  HCO3 10.4* 23.9 26.2* 24.4* 25.6*  TCO2 25.4 26.7  O2SAT 98.0 98.0 90.0 97.9 98.2    CBC  Recent Labs Lab 02/07/15 0249 02/08/15 0323 02/09/15 0232  HGB 8.5* 9.6* 9.5*  HCT 25.9* 29.5* 29.6*  WBC 6.6 11.2* 19.4*  PLT 87* 106* 142*   COAGULATION No results for input(s): INR in the last 168 hours.  CARDIAC    Recent Labs Lab 02/03/15 1610 02/03/15 2010 02/04/15 0238  TROPONINI  1.12* 1.99* 1.54*   No results for input(s): PROBNP in the last 168 hours.  CHEMISTRY  Recent Labs Lab 02/05/15 0250 02/06/15 0236 02/07/15 0249 02/08/15 0323 02/09/15 0232  NA 139 139  138 143 143 140  K 3.9 3.9  3.9 3.5 3.8 3.7  CL 101 103  101 106 108 107  CO2 GLUCOSE 92 83  82 100* 101* 91  BUN 52* 34*  34* 44* 31* 40*  CREATININE 9.55* 6.40*  6.34* 6.36* 4.15* 4.41*  CALCIUM 7.9* 7.7*  7.6* 7.7* 7.9* 8.1*   MG 2.4 2.1 2.0 1.8 1.8  PHOS 6.8* 4.9* 4.6 5.3* 4.9*   Estimated Creatinine Clearance: 24.3 mL/min (by C-G formula based on Cr of 4.41).  LIVER  Recent Labs Lab 02/03/15 1610 02/04/15 0238 02/05/15 0250 02/06/15 0236 02/08/15 0323 02/09/15 0232  AST 48*  --   --   --   --   --   ALT 12*  --   --   --   --   --   ALKPHOS 108  --   --   --   --   --   BILITOT 1.0  --   --   --   --   --   PROT 5.6*  --   --   --   --   --   ALBUMIN 2.1* 2.1* 2.0* 2.1* 2.2* 2.2*   INFECTIOUS  Recent Labs Lab 02/03/15 2010 02/04/15 0238 02/04/15 0846 02/05/15 0250  LATICACIDVEN 1.4 0.9 1.2  --   PROCALCITON 11.75 21.41  --  25.19   ENDOCRINE CBG (last 3)   Recent Labs  02/08/15 1943 02/08/15 2343 02/09/15 0345  GLUCAP 96 87 92   IMAGING   ASSESSMENT / PLAN:  PULMONARY ETT 8/18 >8/28, 9/13 >>>9/18 A: Acute respiratory failure in setting seizure vs aspiration OSA ? PE essentailly ruled out with neg DVT and low prob VQ prior in admit P:   Pulmonary hygiene as able  F/u CXR, was neg 1.4 liters Supplemental O2 as needed  Upright as able Last abg reviewed, no repeat needed  CARDIOVASCULAR Rt femoral CVL 8/18 >>8/20 Lt femoral A line 8/18 >>8/28 RIJ HD 8/20 >out LIJ 8/20 >? RIJ perm cath >>> A:  Hypertenson Elevated lactic acid > suspect due to siezure Shock > Resolved. Likely RV failure with possible PE.-although venous doppler neg for DVT  X Cath 8/06/25/14 OM branch occlusion per report and unlikely cause of admission RV failure AFRVR - resolved P:  Telemetry monitoring Continue nebivolol, norvasc  PRN hydralazine , may add oral agents hydral Volume removal per HD-successful, pcxr not so impressive lasst  RENAL A: ESRD new onset . Was on CRRT now intermittent HD dependent, - r/t septic shock related ATN- RF and pan autoimmune negative 01/14/15.  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  P:   Nephrology following Continue HD per renal  F/u chem  Makes urine, per  renal use lasix>? Ectopy if noted add mag supp today   GASTROINTESTINAL A: Acute abdominal pain s/p laparotomy with wound vac 8/18 >wound vac change 8/19 d/t eviceration>to OR 8/21 with wound closure Shock Liver > resolved Elevated triglycerides  P:  Speech eval when more able to cooperate  NPO for now  Clears if precedex improved  HEMATOLOGIC A: Anemia Coagulopathy with elevated INR probably secnodary to shock liver > resolved Thrombocytopenia -  HITT  Negative 01/13/15 s/p on angiomax 01/13/15 -  01/14/15 P:  F/u CBC and INR  Sub qhep  INFECTIOUS Culture - negative HIV negative 01/14/15 A: Sepsis with presumed abdominal source.>exp lap was neg .  P:   Vanc completed 01/14/15 Zosyn stopped 01/17/15  Follow fever curve  ENDOCRINE A: Hyperglycemia  P:   CBG and SSI  NEUROLOGIC A: Status epilepticus PRES syndrome Acute encephalopathy. Metabolic + seizure component Hx  chronic back pain, anxiety, depression. Agitation  Elevated triglycerides  P:   RASS goal: 0 On Keppra and Vimpat Seizure treatment per Neuro Holding outpt xanax, celexa, oxycodone, lyrica Continue precedex and wean off as able  Add oral hydral  Updates: wife updated 9/18   Ccm time 30 min  .Mcarthur Rossetti. Tyson Alias, MD, FACP Pgr: [phone] [facility]

## 2015-02-09 NOTE — Progress Notes (Signed)
Patient ID: Jared [name], male   DOB: 01-24-1956, 59 y.o.   MRN: [id number]  [facility] Progress Note    Subjective:   RN says he occasionally "spits his fosrenol across the room" Has continued with agitation issues Currently in 4 point restraints and on precedex   Objective:   BP 148/62 mmHg  Pulse 70  Temp(Src) 98.5 F (36.9 C) (Oral)  Resp 30  Wt 119 kg (262 lb 5.6 oz)  SpO2 95%  Intake/Output Summary (Last 24 hours) at 02/09/15 1321 Last data filed at 02/09/15 0946  Gross per 24 hour  Intake 1792.09 ml  Output   3275 ml  Net -1482.91 ml   Weight change: 2 kg (4 lb 6.6 oz)  Physical Exam: Gen: Currently appearing somnolent - in 4 pt restraints - currently quiet (on precedex) Pale app WM TDC in place (01/29/15) CVS: Pulse regular in rate and rhythm, S1 and S2 normal Resp: Coarse breath sounds Abd: Soft, obese, abdominal binder in place Ext: Trace edema LE's  Imaging: Ct Head Wo Contrast  02/07/2015   CLINICAL DATA:  Unwitnessed fall from bed to floor. Unknown loss of consciousness. Headache and agitation.  EXAM: CT HEAD WITHOUT CONTRAST  CT CERVICAL SPINE WITHOUT CONTRAST  TECHNIQUE: Multidetector CT imaging of the head and cervical spine was performed following the standard protocol without intravenous contrast. Multiplanar CT image reconstructions of the cervical spine were also generated.  COMPARISON:  CT 02/03/2015, MRI 02/05/2015  FINDINGS: CT HEAD FINDINGS  Symmetric hypodensity in the posterior occipital lobes, unchanged in appearance and distribution from prior exam. No superimposed hemorrhage. There is no intracranial hemorrhage or fluid collection. No CT findings of acute infarct. The ventricles and extra-axial CSF spaces are unchanged, no hydrocephalus. Diffuse opacification of the mastoid air cells, unchanged. Scattered mucosal thickening of the ethmoid air cells. No calvarial fracture.  CT CERVICAL SPINE FINDINGS  Anterior fusion C5 through C7 with  interbody spacers. There is bony fusion of C4-C5 vertebral bodies. No hardware complication. Straightening of normal cervical lordosis. No listhesis. No acute fracture. Vertebral body heights are maintained. The dens is intact. Lateral masses of C1 well aligned on C2. Scattered facet arthropathy. No prevertebral soft tissue edema. Right central line, partially included.  IMPRESSION: 1. No acute intracranial abnormality. 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 No acute or superimposed hemorrhage. 2. Degenerative and postsurgical change in the cervical spine. No acute fracture or subluxation.   Electronically Signed   By: Rubye Oaks M.D.   On: 02/07/2015 23:08   Ct Cervical Spine Wo Contrast  02/07/2015   CLINICAL DATA:  Unwitnessed fall from bed to floor. Unknown loss of consciousness. Headache and agitation.  EXAM: CT HEAD WITHOUT CONTRAST  CT CERVICAL SPINE WITHOUT CONTRAST  TECHNIQUE: Multidetector CT imaging of the head and cervical spine was performed following the standard protocol without intravenous contrast. Multiplanar CT image reconstructions of the cervical spine were also generated.  COMPARISON:  CT 02/03/2015, MRI 02/05/2015  FINDINGS: CT HEAD FINDINGS  Symmetric hypodensity in the posterior occipital lobes, unchanged in appearance and distribution from prior exam. No superimposed hemorrhage. There is no intracranial hemorrhage or fluid collection. No CT findings of acute infarct. The ventricles and extra-axial CSF spaces are unchanged, no hydrocephalus. Diffuse opacification of the mastoid air cells, unchanged. Scattered mucosal thickening of the ethmoid air cells. No calvarial fracture.  CT CERVICAL SPINE FINDINGS  Anterior fusion C5 through C7 with interbody spacers. There is bony fusion of  C4-C5 vertebral bodies. No hardware complication. Straightening of normal cervical lordosis. No listhesis. No acute fracture. Vertebral body heights are  maintained. The dens is intact. Lateral masses of C1 well aligned on C2. Scattered facet arthropathy. No prevertebral soft tissue edema. Right central line, partially included.  IMPRESSION: 1. No acute intracranial abnormality. 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 No acute or superimposed hemorrhage. 2. Degenerative and postsurgical change in the cervical spine. No acute fracture or subluxation.   Electronically Signed   By: Rubye Oaks M.D.   On: 02/07/2015 23:08   Dg Chest Port 1 View  02/08/2015   CLINICAL DATA:  Respiratory failure.  EXAM: PORTABLE CHEST - 1 VIEW  COMPARISON:  02/07/2015.  FINDINGS: The heart is enlarged but stable. The endotracheal tube and NG tubes have been removed. The right IJ catheter is unchanged. Slight improved basilar lung aeration. No edema or effusions.  IMPRESSION: Interval removal of the ET tube and NG tube.  Stable cardiac enlargement but slight improved bibasilar lung aeration and no pleural effusions or pulmonary edema.   Electronically Signed   By: Rudie Meyer M.D.   On: 02/08/2015 08:09    Labs: BMET  Recent Labs Lab 02/03/15 0448  02/03/15 1610 02/04/15 0238 02/05/15 0250 02/06/15 0236 02/07/15 0249 02/08/15 0323 02/09/15 0232  NA 138  < > 137 140 139 139  138 143 143 140  K 4.0  --  4.6 4.0 3.9 3.9  3.9 3.5 3.8 3.7  CL 102  --  101 103 101 103  101 106 108 107  CO2 26  --  19* 25 25 26  25 27 26 22   GLUCOSE 97  --  162* 114* 92 83  82 100* 101* 91  BUN 62*  --  67* 46* 52* 34*  34* 44* 31* 40*  CREATININE 12.45*  --  13.21* 8.49* 9.55* 6.40*  6.34* 6.36* 4.15* 4.41*  CALCIUM 8.2*  --  8.1* 8.2* 7.9* 7.7*  7.6* 7.7* 7.9* 8.1*  PHOS 6.2*  --   --  3.3 6.8* 4.9* 4.6 5.3* 4.9*  < > = values in this interval not displayed. CBC  Recent Labs Lab 02/06/15 0236 02/07/15 0249 02/08/15 0323 02/09/15 0232  WBC 8.8 6.6 11.2* 19.4*  NEUTROABS 7.2 4.8 9.7* 17.5*  HGB 8.8* 8.5* 9.6* 9.5*  HCT  27.2* 25.9* 29.5* 29.6*  MCV 90.7 91.5 91.0 90.2  PLT 105* 87* 106* 142*   Medications:    . amLODipine  10 mg Oral Daily  . antiseptic oral rinse  7 mL Mouth Rinse QID  . aspirin  81 mg Per Tube Daily  . chlorhexidine gluconate  15 mL Mouth Rinse BID  . [START ON 02/10/2015] darbepoetin (ARANESP) injection - DIALYSIS  100 mcg Intravenous Q Tue-HD  . heparin subcutaneous  5,000 Units Subcutaneous 3 times per day  . hydrALAZINE  25 mg Oral 3 times per day  . lacosamide (VIMPAT) IV  100 mg Intravenous Q12H  . lanthanum  500 mg Oral TID WC  . levETIRAcetam  1,000 mg Intravenous Q12H  . multivitamin with minerals  1 tablet Oral Daily  . nebivolol  5 mg Oral Daily  . sodium chloride  10 mL Intravenous Q12H   Infusions: . dexmedetomidine 0.898 mcg/kg/hr (02/09/15 1330)    Assessment/ Plan:   1. Acute on chronic kidney disease stage II (possible ESRD)- due to septic shock related ATN- (creatinine 1.15 on July 27)-previously on CRRT and then transitioned  to IHD; he has been on renal replacement therapy for the last ~4 wks and was being teed up for outpatient dialysis unit placement prior to his sz/PRES. Continues to maintain a good urine output and he is on intermittent dialysis on a Tuesday/Thursday/Saturday schedule via RIJ TDC. Permanent access placement deferred after patient decompensated following GTC seizure/respiratory failure. CCM asks about use of diuretics - not sure why we need since making 3+ liters/day of urine... 2. CAD- cath revealed occluded OM, ongoing medical management 3. S/p exploratory lap (negative) and fascia closure now s/p wound vac - per CCS/wound care 4. Nutrition: off TPN, taking some PO, continue oral nutritional supplementation 5. Anemia- hemoglobin stable status post PRBCs and Aranesp/intravenous iron 6. CKD/MBD- currently intubated/nothing by mouth-binders on hold. PTH level acceptable and not on the SVC/RA. 7. Seizure episode/PRES/AMS/encephalopathy - issues  now with agitation.  On precedex.  8. Acute respiratory failure:d/t sz vs aspiration. Doing OK off vent. No ATB's now.    Camille Bal, MD [facility] [phone] Pager 02/09/2015, 1:22 PM

## 2015-02-09 NOTE — Progress Notes (Signed)
eLink Physician-Brief Progress Note Patient Name: Jared [name] DOB: [birth year] MRN: [id number]   Date of Service  02/09/2015  HPI/Events of Note  Fever to 101.5 F and WBC = 19.4.  eICU Interventions  Will order: 1. Blood Cultures x2. 2. UA and Urine Culture now.  3. Empiric Vancomycin and Zosyn per pharmacy. 4. Tylenol Suppository 650 mg PRN.      Intervention Category Major Interventions: Infection - evaluation and management  Sommer,Steven Eugene 02/09/2015, 8:47 PM

## 2015-02-10 ENCOUNTER — Inpatient Hospital Stay (HOSPITAL_COMMUNITY): Payer: Medicare Other

## 2015-02-10 LAB — CBC WITH DIFFERENTIAL/PLATELET
BASOS PCT: 0 %
Basophils Absolute: 0.1 10*3/uL (ref 0.0–0.1)
EOS ABS: 0 10*3/uL (ref 0.0–0.7)
Eosinophils Relative: 0 %
HEMATOCRIT: 28.5 % — AB (ref 39.0–52.0)
Hemoglobin: 9.2 g/dL — ABNORMAL LOW (ref 13.0–17.0)
Lymphocytes Relative: 4 %
Lymphs Abs: 0.7 10*3/uL (ref 0.7–4.0)
MCH: 29.1 pg (ref 26.0–34.0)
MCHC: 32.3 g/dL (ref 30.0–36.0)
MCV: 90.2 fL (ref 78.0–100.0)
MONO ABS: 0.9 10*3/uL (ref 0.1–1.0)
MONOS PCT: 5 %
Neutro Abs: 15.6 10*3/uL — ABNORMAL HIGH (ref 1.7–7.7)
Neutrophils Relative %: 91 %
Platelets: 169 10*3/uL (ref 150–400)
RBC: 3.16 MIL/uL — ABNORMAL LOW (ref 4.22–5.81)
RDW: 15.1 % (ref 11.5–15.5)
WBC: 17.3 10*3/uL — ABNORMAL HIGH (ref 4.0–10.5)

## 2015-02-10 LAB — RENAL FUNCTION PANEL
ANION GAP: 11 (ref 5–15)
Albumin: 2 g/dL — ABNORMAL LOW (ref 3.5–5.0)
BUN: 44 mg/dL — ABNORMAL HIGH (ref 6–20)
CALCIUM: 7.9 mg/dL — AB (ref 8.9–10.3)
CHLORIDE: 106 mmol/L (ref 101–111)
CO2: 20 mmol/L — AB (ref 22–32)
Creatinine, Ser: 4.07 mg/dL — ABNORMAL HIGH (ref 0.61–1.24)
GFR calc Af Amer: 17 mL/min — ABNORMAL LOW (ref >60)
GFR calc non Af Amer: 15 mL/min — ABNORMAL LOW (ref >60)
GLUCOSE: 92 mg/dL (ref 65–99)
Phosphorus: 3.9 mg/dL (ref 2.5–4.6)
Potassium: 3.5 mmol/L (ref 3.5–5.1)
SODIUM: 137 mmol/L (ref 135–145)

## 2015-02-10 LAB — GLUCOSE, CAPILLARY
GLUCOSE-CAPILLARY: 109 mg/dL — AB (ref 65–99)
GLUCOSE-CAPILLARY: 88 mg/dL (ref 65–99)
GLUCOSE-CAPILLARY: 90 mg/dL (ref 65–99)
Glucose-Capillary: 104 mg/dL — ABNORMAL HIGH (ref 65–99)
Glucose-Capillary: 85 mg/dL (ref 65–99)

## 2015-02-10 LAB — MAGNESIUM: MAGNESIUM: 1.9 mg/dL (ref 1.7–2.4)

## 2015-02-10 MED ORDER — LORAZEPAM 2 MG/ML IJ SOLN
1.0000 mg | Freq: Three times a day (TID) | INTRAMUSCULAR | Status: DC
  Administered 2015-02-10 – 2015-02-11 (×3): 1 mg via INTRAVENOUS
  Filled 2015-02-10 (×3): qty 1

## 2015-02-10 MED ORDER — LEVETIRACETAM 500 MG PO TABS
1000.0000 mg | ORAL_TABLET | Freq: Two times a day (BID) | ORAL | Status: DC
  Administered 2015-02-10 – 2015-02-11 (×2): 1000 mg via ORAL
  Filled 2015-02-10 (×4): qty 2

## 2015-02-10 MED ORDER — LACOSAMIDE 50 MG PO TABS
100.0000 mg | ORAL_TABLET | Freq: Two times a day (BID) | ORAL | Status: DC
  Administered 2015-02-10 – 2015-02-11 (×3): 100 mg via ORAL
  Filled 2015-02-10 (×3): qty 2

## 2015-02-10 MED ORDER — SODIUM CHLORIDE 0.9 % IV SOLN
INTRAVENOUS | Status: DC
  Administered 2015-02-10: 17:00:00 via INTRAVENOUS

## 2015-02-10 NOTE — Progress Notes (Signed)
PT assessed for PIV both arms are swollen and no attempts made. Nurse will contact the MD for further direction on venous access. Consuello Masse

## 2015-02-10 NOTE — Progress Notes (Signed)
Physical Therapy Treatment Patient Details Name: Jared [name] MRN: [id number] DOB: [birth year] Today's Date: 02/10/2015    History of Present Illness Pt is a 59 y/o male with a PMH of chronic back pain, chest pain, HTN, hyerlipidemia, obesity, sleep apnea, depression. Pt was found down at home by wife on 01/08/15. He was intubated due to acute respiratory failure. Pt had exploratory laparotomy, cardiac catheterization, and received dialysis treatment. Pt's primary issues are pulmonary embolism and difficulty with his abdominal wound. Pt's active problems are spetic shock, acute respiratory failure with hypoxia, acute respiratory failure with hypoxemia, cardigenic shock, acute kidney injury, and renal failure. Pt was extubated on 01/18/15 after a 10 day intubation period. Had been on bedrest due to temporary femoral HD catheter, with tunneled HD catheter placed on 9/8    PT Comments    Pt progressing towards physical therapy goals. Was able to perform transfer bed>chair with +2 max assistance. Pt moving extremely slow during session and had difficulty staying on task. When redirected or encouraged would often become irritated/agitated, stating "I'm about to get ill". Pt had difficulty achieving a full upright posture, and was unable to make corrective changes with cueing. No longer feel that CIR is an appropriate d/c disposition based on current functional level. Will continue to follow and progress as able per POC.   Follow Up Recommendations  LTACH;Supervision/Assistance - 24 hour     Equipment Recommendations  3in1 (PT)    Recommendations for Other Services OT consult     Precautions / Restrictions Precautions Precautions: Fall Precaution Comments: 4 point restraints - agitated and combative Restrictions Weight Bearing Restrictions: No    Mobility  Bed Mobility Overal bed mobility: Needs Assistance;+2 for physical assistance Bed Mobility: Supine to Sit     Supine to sit: Total  assist;+2 for physical assistance     General bed mobility comments: +2 assist required for all aspects of bed mobility. Bed pad used for assist in scooting and positioning.   Transfers Overall transfer level: Needs assistance Equipment used: 2 person hand held assist Transfers: Sit to/from [employer] Sit to Stand: Max assist;+2 physical assistance Stand pivot transfers: Max assist;+2 physical assistance       General transfer comment: Pt with very flexed trunk - unable to make corrective changes with cueing. With SPT bed to chair, pt was unable to advance feet without assist and was ultimately keeping his feet planted during pivot.   Ambulation/Gait             General Gait Details: Unable at this time.    Stairs            Wheelchair Mobility    Modified Rankin (Stroke Patients Only)       Balance Overall balance assessment: Needs assistance Sitting-balance support: Feet supported;No upper extremity supported Sitting balance-Leahy Scale: Fair     Standing balance support: Bilateral upper extremity supported;During functional activity Standing balance-Leahy Scale: Zero Standing balance comment: +2 assist required.                     Cognition Arousal/Alertness: Lethargic Behavior During Therapy: Agitated;Anxious Overall Cognitive Status: Impaired/Different from baseline Area of Impairment: Safety/judgement;Attention;Memory;Problem solving   Current Attention Level: Focused Memory: Decreased short-term memory Following Commands: Follows one step commands inconsistently;Follows one step commands with increased time Safety/Judgement: Decreased awareness of safety;Decreased awareness of deficits Awareness: Intellectual Problem Solving: Slow processing;Requires verbal cues;Requires tactile cues General Comments: Did not recall things that he had said  minutes before.     Exercises      General Comments        Pertinent  Vitals/Pain Pain Assessment: Faces Faces Pain Scale: Hurts even more Pain Location: Feet, bottom, back, testicles, penis. Pain Descriptors / Indicators: Grimacing;Guarding Pain Intervention(s): Limited activity within patient's tolerance;Monitored during session;Repositioned    Home Living                      Prior Function            PT Goals (current goals can now be found in the care plan section) Acute Rehab PT Goals PT Goal Formulation: Patient unable to participate in goal setting Time For Goal Achievement: 02/16/15 Potential to Achieve Goals: Fair Progress towards PT goals: Progressing toward goals    Frequency  Min 2X/week    PT Plan Discharge plan needs to be updated;Frequency needs to be updated    Co-evaluation             End of Session Equipment Utilized During Treatment: Gait belt Activity Tolerance: Patient tolerated treatment well Patient left: in bed;with call bell/phone within reach;with family/visitor present     Time: 1610-9604 PT Time Calculation (min) (ACUTE ONLY): 41 min  Charges:  $Therapeutic Activity: 38-52 mins                    G Codes:      Conni Slipper 03/05/15, 2:06 PM  Conni Slipper, PT, DPT Acute Rehabilitation Services Pager: [phone]

## 2015-02-10 NOTE — Progress Notes (Signed)
eLink Physician-Brief Progress Note Patient Name: SHALEV RIDENBAUGH DOB: 1955/07/19 MRN: [id number]   Date of Service  02/10/2015  HPI/Events of Note    eICU Interventions  Rectal tube for diarrhea     Intervention Category Minor Interventions: Routine modifications to care plan (e.g. PRN medications for pain, fever)  Billy Fischer 02/10/2015, 11:57 PM

## 2015-02-10 NOTE — Progress Notes (Signed)
PULMONARY / CRITICAL CARE MEDICINE   Name: Jared [name] MRN: [id number] DOB: 1955-11-12    ADMISSION DATE:  01/08/2015 CONSULTATION DATE:  8/18  REFERRING MD :  EDP Dr Criss Alvine  CHIEF COMPLAINT:  Found down  BRIEF DESCRIPTION:   59 yo male had severe abdominal pain, and then found unresponsive at home.  In ER he was in shock, cyanotic, with lactic acidosis, and hyperkalemia. Initial suspicion was for PE, however VQ was low prob. Etiology for such acute deterioration remains unclear, but is presumed to be occult septic shock by exclusion of PE, MI, bowel ischemia. Has been recovering, however has required continued dialysis. 9/13 suffered respiratory arrest and seizures requiring intubation and ICU transfer. Transferred to the floor. Admit back to ICU on 9/13 after seizures, PRES syndrome. Intubated for resp failure.   STUDIES:  8/18 Echo > EF 45 to 50%, mod RV dilation, mod/severe RV dysfx, PAS 47 mmHg 8/18 Renal u/s >> b/l renal echogenicity 8/23 LHC - left circumflex, second obtuse marginal branch 100% occlusion  CT head/neck 9/18>> NEG acute   SIGNIFICANT EVENTS: 8/18 Admit, CCS, cardiology consulted; brief PEA arrest; laparotomy with placement of wound vac; started heparin gtt for presumed PE 8/19 Renal consulted. Duplex LE negative for DVT 8/20 CRRT  8/21 OR for abd wound closure  8/23  Ileus, vanc stopped 8/26  Tolerating intemittent HD - oliguric. Vanc Stopped 8/27  hypertensive - on hydralazine prn. Anuric. Being weaned off dilaudid.  8/28  Extubated  TPN.  9/5 to tele 9/13 Seizure, resp arrest > intubated > to ICU 9/17 extuibated 9/18-  Fell OOB overnight while agitated, CT head neg. Remains intermittently very agitated, improved on precedex.  Low grade fever.   SUBJECTIVE/OVERNIGHT:  precedex on , "dont kill me"  VITAL SIGNS: Temp:  [97.8 F (36.6 C)-101.5 F (38.6 C)] 97.8 F (36.6 C) (09/20 1225) Pulse Rate:  [59-80] 63 (09/20 1000) Resp:  [13-31] 18 (09/20  1000) BP: (136-169)/(52-77) 138/66 mmHg (09/20 1000) SpO2:  [92 %-100 %] 99 % (09/20 1000) Weight:  [117.5 kg (259 lb 0.7 oz)] 117.5 kg (259 lb 0.7 oz) (09/20 0500) HEMODYNAMICS:   VENTILATOR SETTINGS:   INTAKE / OUTPUT:  Intake/Output Summary (Last 24 hours) at 02/10/15 1308 Last data filed at 02/10/15 0939  Gross per 24 hour  Intake 2604.18 ml  Output   2425 ml  Net 179.18 ml   Total I/O In: 275.8 [P.O.:240; I.V.:35.8] Out: -   I/O last 3 completed shifts: In: 3930.1 [P.O.:1340; I.V.:1555.1; IV Piggyback:1035] Out: 4800 [Urine:4800]  PHYSICAL EXAMINATION: General: awake, in chair Neuro: sedated on precedex, RASS 0  HEENT  No JVD. Cardiovascular: RRR, no MRG. Lungs: CTA Abdomen: Obese, soft, surgical dressing in place, dry Musculoskeletal: No acute deformity.  LABS:  PULMONARY  Recent Labs Lab 02/03/15 1425 02/03/15 1748 02/04/15 1315 02/05/15 0420 02/07/15 0500  PHART 7.069* 7.565* 7.453* 7.486* 7.467*  PCO2ART 36.2 26.4* 37.5 32.7* 35.9  PO2ART 141.0* 82.0 57.0* 102* 107*  HCO3 10.4* 23.9 26.2* 24.4* 25.6*  TCO2 25.4 26.7  O2SAT 98.0 98.0 90.0 97.9 98.2    CBC  Recent Labs Lab 02/08/15 0323 02/09/15 0232 02/10/15 0256  HGB 9.6* 9.5* 9.2*  HCT 29.5* 29.6* 28.5*  WBC 11.2* 19.4* 17.3*  PLT 106* 142* 169   COAGULATION No results for input(s): INR in the last 168 hours.  CARDIAC    Recent Labs Lab 02/03/15 1610 02/03/15 2010 02/04/15 0238  TROPONINI 1.12* 1.99* 1.54*  No results for input(s): PROBNP in the last 168 hours.  CHEMISTRY  Recent Labs Lab 02/06/15 0236 02/07/15 0249 02/08/15 0323 02/09/15 0232 02/10/15 0256  NA 139  138 143 143 140 137  K 3.9  3.9 3.5 3.8 3.7 3.5  CL 103  101 106 108 107 106  CO2 20*  GLUCOSE 83  82 100* 101* 91 92  BUN 34*  34* 44* 31* 40* 44*  CREATININE 6.40*  6.34* 6.36* 4.15* 4.41* 4.07*  CALCIUM 7.7*  7.6* 7.7* 7.9* 8.1* 7.9*  MG 2.1 2.0 1.8 1.8 1.9   PHOS 4.9* 4.6 5.3* 4.9* 3.9   Estimated Creatinine Clearance: 26.2 mL/min (by C-G formula based on Cr of 4.07).  LIVER  Recent Labs Lab 02/03/15 1610  02/05/15 0250 02/06/15 0236 02/08/15 0323 02/09/15 0232 02/10/15 0256  AST 48*  --   --   --   --   --   --   ALT 12*  --   --   --   --   --   --   ALKPHOS 108  --   --   --   --   --   --   BILITOT 1.0  --   --   --   --   --   --   PROT 5.6*  --   --   --   --   --   --   ALBUMIN 2.1*  < > 2.0* 2.1* 2.2* 2.2* 2.0*  < > = values in this interval not displayed. INFECTIOUS  Recent Labs Lab 02/03/15 2010 02/04/15 0238 02/04/15 0846 02/05/15 0250  LATICACIDVEN 1.4 0.9 1.2  --   PROCALCITON 11.75 21.41  --  25.19   ENDOCRINE CBG (last 3)   Recent Labs  02/10/15 0029 02/10/15 0427 02/10/15 0859  GLUCAP 88 90 109*   IMAGING   ASSESSMENT / PLAN:  PULMONARY ETT 8/18 >8/28, 9/13 >>>9/18 A: Acute respiratory failure in setting seizure vs aspiration OSA ? PE essentailly ruled out with neg DVT and low prob VQ prior in admit P:   Likely can tolerate more neg balnce, pcxr reviewed Supplemental O2 as needed   CARDIOVASCULAR Rt femoral CVL 8/18 >>8/20 Lt femoral A line 8/18 >>8/28 RIJ HD 8/20 >out LIJ 8/20 >? RIJ perm cath >>> A:  Hypertenson Elevated lactic acid > suspect due to siezure Shock > Resolved. Likely RV failure with possible PE.-although venous doppler neg for DVT  X Cath 8/06/25/14 OM branch occlusion per report and unlikely cause of admission RV failure AFRVR - resolved P:  Telemetry monitoring Continue nebivolol, norvasc  PRN hydralazine hydralazine , escalate further for more control in setting press, currently controlled  RENAL A: ESRD new onset . Was on CRRT now intermittent HD dependent, - r/t septic shock related ATN- RF and pan autoimmune negative 01/14/15.  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  Fever 9/19 UTI likley source P:   Vanc completed 01/14/15 Zosyn stopped 01/17/15  Follow UA Follow fever curve Follow urine culture Dc foley  ENDOCRINE A: Hyperglycemia  P:   CBG and SSI  NEUROLOGIC A: Status epilepticus PRES syndrome Acute encephalopathy. Metabolic + seizure component Hx  chronic back pain, anxiety, depression. Agitation  Elevated triglycerides  P:   RASS goal: 0 On Keppra and Vimpat Seizure treatment per Neuro Holding outpt xanax, celexa, oxycodone, lyrica To wean precedex, QTC long, no haldol, add IV ativan Limited options Updates: wife updated 9/18  Ccm time 30 min   .Mcarthur Rossetti. Tyson Alias, MD, FACP Pgr: [phone] [facility]

## 2015-02-10 NOTE — Progress Notes (Signed)
Pt become verbally abusive to RN and NT during bathing. Pt tried to swing at RN once restraints were removed to turn and clean pt. RN tired to redirect pt but intervention was unsuccessful. Pt was clean and restraints reapplied.

## 2015-02-10 NOTE — Progress Notes (Signed)
Patient ID: Jared [name], male   DOB: 11-Jun-1955, 59 y.o.   MRN: [id number]  [facility] Progress Note    Subjective:   Ongoing issues with agitation requiring restraints Last HD was 9/17 UOP 3.2 liters/24 hours and for the first time - creatinine has DECLINED overe a 24 hour period without HD    Objective:   BP 146/70 mmHg  Pulse 64  Temp(Src) 99.4 F (37.4 C) (Axillary)  Resp 15  Wt 117.5 kg (259 lb 0.7 oz)  SpO2 98%  Intake/Output Summary (Last 24 hours) at 02/10/15 0721 Last data filed at 02/10/15 0700  Gross per 24 hour  Intake 2640.58 ml  Output   3275 ml  Net -634.42 ml   Weight change: -1.5 kg (-3 lb 4.9 oz)  Physical Exam: Gen: Currently appearing somnolent - in 4 pt restraints  Pale  WM TDC in place (01/29/15) CVS: Regular in rate and rhythm, S1 and S2 normal Resp: Coarse breath sounds Abd: Soft, obese, abdominal binder in place Ext: Trace edema LE's  Labs: BMET  Recent Labs Lab 02/04/15 0238 02/05/15 0250 02/06/15 0236 02/07/15 0249 02/08/15 0323 02/09/15 0232 02/10/15 0256  NA 140 139 139  138 143 143 140 137  K 4.0 3.9 3.9  3.9 3.5 3.8 3.7 3.5  CL 103 101 103  101 106 108 107 106  CO2 25 25 26  25 27 26 22  20*  GLUCOSE 114* 92 83  82 100* 101* 91 92  BUN 46* 52* 34*  34* 44* 31* 40* 44*  CREATININE 8.49* 9.55* 6.40*  6.34* 6.36* 4.15* 4.41* 4.07*  CALCIUM 8.2* 7.9* 7.7*  7.6* 7.7* 7.9* 8.1* 7.9*  PHOS 3.3 6.8* 4.9* 4.6 5.3* 4.9* 3.9   CBC  Recent Labs Lab 02/07/15 0249 02/08/15 0323 02/09/15 0232 02/10/15 0256  WBC 6.6 11.2* 19.4* 17.3*  NEUTROABS 4.8 9.7* 17.5* 15.6*  HGB 8.5* 9.6* 9.5* 9.2*  HCT 25.9* 29.5* 29.6* 28.5*  MCV 91.5 91.0 90.2 90.2  PLT 87* 106* 142* 169   Medications:    . amLODipine  10 mg Oral Daily  . antiseptic oral rinse  7 mL Mouth Rinse BID  . aspirin  81 mg Per Tube Daily  . darbepoetin (ARANESP) injection - DIALYSIS  100 mcg Intravenous Q Tue-HD  . heparin subcutaneous  5,000  Units Subcutaneous 3 times per day  . hydrALAZINE  25 mg Oral 3 times per day  . lacosamide (VIMPAT) IV  100 mg Intravenous Q12H  . lanthanum  500 mg Oral TID WC  . levETIRAcetam  1,000 mg Intravenous Q12H  . multivitamin with minerals  1 tablet Oral Daily  . nebivolol  5 mg Oral Daily  . piperacillin-tazobactam (ZOSYN)  IV  2.25 g Intravenous 3 times per day  . sodium chloride  10 mL Intravenous Q12H  . vancomycin  1,000 mg Intravenous Q T,Th,Sa-HD   Infusions: . dexmedetomidine 1.2 mcg/kg/hr (02/10/15 0700)    Assessment/ Plan:   1. Acute on chronic kidney disease stage II (possible ESRD)- due to septic shock related ATN- (creatinine 1.15 on July 27)-previously on CRRT and then transitioned to IHD; he has been on renal replacement therapy for the last ~4 wks and was being teed up for outpatient dialysis unit placement prior to his sz/PRES. He continues to make good urine and for the first time today creatinine has declined over a 24 hour period without HD - will hold HD today and reassess daily. We may be starting  to see some return in GFR... 2. CAD- cath revealed occluded OM, ongoing medical management 3. S/p exploratory lap (negative) and fascia closure now s/p wound vac - per CCS/wound care 4. Nutrition: off TPN, taking some PO, continue oral nutritional supplementation 5. Anemia- hemoglobin stable status post PRBCs and Aranesp/intravenous iron 6. CKD/MBD- currently intubated/nothing by mouth-binders on hold. PTH level acceptable and not on the SVC/RA. 7. Seizure episode/PRES/AMS/encephalopathy - issues now with agitation.  On precedex.  8. Acute respiratory failure:d/t sz vs aspiration. Doing OK off vent.  9. Fever/leukocytosis - source not clear. Vanco and zosyn started per CCM.    Camille Bal, MD [facility] [phone] Pager 02/10/2015, 7:21 AM

## 2015-02-10 NOTE — Progress Notes (Signed)
eLink Physician-Brief Progress Note Patient Name: Jared [name] DOB: [birth year] MRN: [id number]   Date of Service  02/10/2015  HPI/Events of Note  Patient is growing GPC's in clusters from blood culture on 02/09/2015. There is no organism ID or Sensitivity available at this time. Current Abx Rx includes Vancomycin and Zosyn dosed per pharmacy.   eICU Interventions  Vancomycin should cover the GPC's in clusters pending ID and Sensitivities.      Intervention Category Major Interventions: Infection - evaluation and management  Sommer,Steven Eugene 02/10/2015, 7:56 PM

## 2015-02-10 NOTE — Progress Notes (Signed)
CRITICAL VALUE ALERT  Critical value received:  Positive BC aerobic bottle gram + cocci & clusters  Date of notification:  02/10/2015  Time of notification:  0730  Critical value read back:Yes.    Nurse who received alert:  Lillia Corporal  MD notified (1st page):  Dr. Arsenio Loader  Time of first page:  0750  MD notified (2nd page):  Time of second page:  Responding MD:  Dr. Arsenio Loader  Time MD responded:  [phone]

## 2015-02-11 ENCOUNTER — Encounter (HOSPITAL_COMMUNITY): Payer: Self-pay | Admitting: *Deleted

## 2015-02-11 ENCOUNTER — Inpatient Hospital Stay
Admission: AD | Admit: 2015-02-11 | Discharge: 2015-03-04 | Disposition: A | Discharge status: Home / Self Care | Payer: Self-pay | Source: Ambulatory Visit | Attending: Internal Medicine | Admitting: Internal Medicine

## 2015-02-11 DIAGNOSIS — R100 Acute abdomen: Secondary | ICD-10-CM | POA: Diagnosis not present

## 2015-02-11 DIAGNOSIS — F329 Major depressive disorder, single episode, unspecified: Secondary | ICD-10-CM | POA: Diagnosis not present

## 2015-02-11 DIAGNOSIS — R220 Localized swelling, mass and lump, head: Secondary | ICD-10-CM | POA: Diagnosis not present

## 2015-02-11 DIAGNOSIS — I5040 Unspecified combined systolic (congestive) and diastolic (congestive) heart failure: Secondary | ICD-10-CM | POA: Diagnosis present

## 2015-02-11 DIAGNOSIS — N179 Acute kidney failure, unspecified: Secondary | ICD-10-CM | POA: Diagnosis not present

## 2015-02-11 DIAGNOSIS — E876 Hypokalemia: Secondary | ICD-10-CM | POA: Diagnosis not present

## 2015-02-11 DIAGNOSIS — I480 Paroxysmal atrial fibrillation: Secondary | ICD-10-CM | POA: Diagnosis present

## 2015-02-11 DIAGNOSIS — M545 Low back pain: Secondary | ICD-10-CM | POA: Diagnosis not present

## 2015-02-11 DIAGNOSIS — E440 Moderate protein-calorie malnutrition: Secondary | ICD-10-CM | POA: Diagnosis present

## 2015-02-11 DIAGNOSIS — J9690 Respiratory failure, unspecified, unspecified whether with hypoxia or hypercapnia: Secondary | ICD-10-CM

## 2015-02-11 DIAGNOSIS — I252 Old myocardial infarction: Secondary | ICD-10-CM | POA: Diagnosis not present

## 2015-02-11 DIAGNOSIS — J9600 Acute respiratory failure, unspecified whether with hypoxia or hypercapnia: Secondary | ICD-10-CM | POA: Diagnosis not present

## 2015-02-11 DIAGNOSIS — Z9119 Patient's noncompliance with other medical treatment and regimen: Secondary | ICD-10-CM | POA: Diagnosis not present

## 2015-02-11 DIAGNOSIS — I129 Hypertensive chronic kidney disease with stage 1 through stage 4 chronic kidney disease, or unspecified chronic kidney disease: Secondary | ICD-10-CM | POA: Diagnosis present

## 2015-02-11 DIAGNOSIS — Z95828 Presence of other vascular implants and grafts: Secondary | ICD-10-CM

## 2015-02-11 DIAGNOSIS — B9689 Other specified bacterial agents as the cause of diseases classified elsewhere: Secondary | ICD-10-CM | POA: Diagnosis present

## 2015-02-11 DIAGNOSIS — I120 Hypertensive chronic kidney disease with stage 5 chronic kidney disease or end stage renal disease: Secondary | ICD-10-CM | POA: Diagnosis not present

## 2015-02-11 DIAGNOSIS — R109 Unspecified abdominal pain: Secondary | ICD-10-CM | POA: Diagnosis not present

## 2015-02-11 DIAGNOSIS — K5900 Constipation, unspecified: Secondary | ICD-10-CM | POA: Diagnosis not present

## 2015-02-11 DIAGNOSIS — G8929 Other chronic pain: Secondary | ICD-10-CM | POA: Diagnosis not present

## 2015-02-11 DIAGNOSIS — I2510 Atherosclerotic heart disease of native coronary artery without angina pectoris: Secondary | ICD-10-CM | POA: Diagnosis present

## 2015-02-11 DIAGNOSIS — G4733 Obstructive sleep apnea (adult) (pediatric): Secondary | ICD-10-CM | POA: Diagnosis present

## 2015-02-11 DIAGNOSIS — Z8674 Personal history of sudden cardiac arrest: Secondary | ICD-10-CM | POA: Diagnosis not present

## 2015-02-11 DIAGNOSIS — I639 Cerebral infarction, unspecified: Secondary | ICD-10-CM

## 2015-02-11 DIAGNOSIS — E872 Acidosis: Secondary | ICD-10-CM | POA: Diagnosis not present

## 2015-02-11 DIAGNOSIS — R42 Dizziness and giddiness: Secondary | ICD-10-CM | POA: Diagnosis not present

## 2015-02-11 DIAGNOSIS — G894 Chronic pain syndrome: Secondary | ICD-10-CM | POA: Diagnosis present

## 2015-02-11 DIAGNOSIS — R739 Hyperglycemia, unspecified: Secondary | ICD-10-CM | POA: Diagnosis present

## 2015-02-11 DIAGNOSIS — F419 Anxiety disorder, unspecified: Secondary | ICD-10-CM | POA: Diagnosis not present

## 2015-02-11 DIAGNOSIS — G9341 Metabolic encephalopathy: Secondary | ICD-10-CM | POA: Diagnosis not present

## 2015-02-11 DIAGNOSIS — T827XXA Infection and inflammatory reaction due to other cardiac and vascular devices, implants and grafts, initial encounter: Secondary | ICD-10-CM

## 2015-02-11 DIAGNOSIS — F330 Major depressive disorder, recurrent, mild: Secondary | ICD-10-CM | POA: Diagnosis present

## 2015-02-11 DIAGNOSIS — F418 Other specified anxiety disorders: Secondary | ICD-10-CM | POA: Diagnosis present

## 2015-02-11 DIAGNOSIS — Z4901 Encounter for fitting and adjustment of extracorporeal dialysis catheter: Secondary | ICD-10-CM | POA: Diagnosis not present

## 2015-02-11 DIAGNOSIS — N390 Urinary tract infection, site not specified: Secondary | ICD-10-CM | POA: Diagnosis not present

## 2015-02-11 DIAGNOSIS — R443 Hallucinations, unspecified: Secondary | ICD-10-CM | POA: Diagnosis present

## 2015-02-11 DIAGNOSIS — R269 Unspecified abnormalities of gait and mobility: Secondary | ICD-10-CM | POA: Diagnosis not present

## 2015-02-11 DIAGNOSIS — N182 Chronic kidney disease, stage 2 (mild): Secondary | ICD-10-CM | POA: Diagnosis present

## 2015-02-11 DIAGNOSIS — R531 Weakness: Secondary | ICD-10-CM | POA: Diagnosis not present

## 2015-02-11 DIAGNOSIS — J9611 Chronic respiratory failure with hypoxia: Secondary | ICD-10-CM | POA: Diagnosis not present

## 2015-02-11 DIAGNOSIS — G40909 Epilepsy, unspecified, not intractable, without status epilepticus: Secondary | ICD-10-CM | POA: Diagnosis not present

## 2015-02-11 LAB — URINE CULTURE

## 2015-02-11 LAB — CBC WITH DIFFERENTIAL/PLATELET
BASOS PCT: 1 %
Basophils Absolute: 0.1 10*3/uL (ref 0.0–0.1)
Eosinophils Absolute: 0.1 10*3/uL (ref 0.0–0.7)
Eosinophils Relative: 1 %
HEMATOCRIT: 32 % — AB (ref 39.0–52.0)
HEMOGLOBIN: 10.5 g/dL — AB (ref 13.0–17.0)
LYMPHS ABS: 0.7 10*3/uL (ref 0.7–4.0)
LYMPHS PCT: 6 %
MCH: 29.3 pg (ref 26.0–34.0)
MCHC: 32.8 g/dL (ref 30.0–36.0)
MCV: 89.4 fL (ref 78.0–100.0)
MONOS PCT: 5 %
Monocytes Absolute: 0.7 10*3/uL (ref 0.1–1.0)
NEUTROS ABS: 10.9 10*3/uL — AB (ref 1.7–7.7)
NEUTROS PCT: 88 %
Platelets: 140 10*3/uL — ABNORMAL LOW (ref 150–400)
RBC: 3.58 MIL/uL — ABNORMAL LOW (ref 4.22–5.81)
RDW: 15.1 % (ref 11.5–15.5)
WBC: 12.9 10*3/uL — ABNORMAL HIGH (ref 4.0–10.5)

## 2015-02-11 LAB — RENAL FUNCTION PANEL
Albumin: 2.2 g/dL — ABNORMAL LOW (ref 3.5–5.0)
Anion gap: 11 (ref 5–15)
BUN: 43 mg/dL — AB (ref 6–20)
CALCIUM: 8.3 mg/dL — AB (ref 8.9–10.3)
CHLORIDE: 110 mmol/L (ref 101–111)
CO2: 20 mmol/L — AB (ref 22–32)
CREATININE: 3.77 mg/dL — AB (ref 0.61–1.24)
GFR calc Af Amer: 18 mL/min — ABNORMAL LOW (ref >60)
GFR, EST NON AFRICAN AMERICAN: 16 mL/min — AB (ref >60)
Glucose, Bld: 97 mg/dL (ref 65–99)
Phosphorus: 3.1 mg/dL (ref 2.5–4.6)
Potassium: 3.6 mmol/L (ref 3.5–5.1)
SODIUM: 141 mmol/L (ref 135–145)

## 2015-02-11 LAB — GLUCOSE, CAPILLARY
GLUCOSE-CAPILLARY: 91 mg/dL (ref 65–99)
GLUCOSE-CAPILLARY: 96 mg/dL (ref 65–99)
Glucose-Capillary: 93 mg/dL (ref 65–99)
Glucose-Capillary: 98 mg/dL (ref 65–99)

## 2015-02-11 LAB — CULTURE, BLOOD (ROUTINE X 2)

## 2015-02-11 LAB — MAGNESIUM: Magnesium: 1.9 mg/dL (ref 1.7–2.4)

## 2015-02-11 LAB — VANCOMYCIN, RANDOM: Vancomycin Rm: 21 ug/mL

## 2015-02-11 MED ORDER — HYDRALAZINE HCL 50 MG PO TABS: 50.0000 mg | ORAL_TABLET | Freq: Three times a day (TID) | ORAL | Status: DC

## 2015-02-11 MED ORDER — HEPARIN SODIUM (PORCINE) 1000 UNIT/ML DIALYSIS: 3000.0000 [IU] | INTRAMUSCULAR | Status: DC | PRN

## 2015-02-11 MED ORDER — CIPROFLOXACIN IN D5W 200 MG/100ML IV SOLN
200.0000 mg | INTRAVENOUS | Status: DC
  Administered 2015-02-11: 200 mg via INTRAVENOUS
  Filled 2015-02-11 (×2): qty 100

## 2015-02-11 MED ORDER — LACOSAMIDE 100 MG PO TABS: 100.0000 mg | ORAL_TABLET | Freq: Two times a day (BID) | ORAL | Status: DC

## 2015-02-11 MED ORDER — DARBEPOETIN ALFA 100 MCG/0.5ML IJ SOSY: 100.0000 ug | PREFILLED_SYRINGE | INTRAMUSCULAR | Status: DC

## 2015-02-11 MED ORDER — HEPARIN SODIUM (PORCINE) 5000 UNIT/ML IJ SOLN: 5000.0000 [IU] | Freq: Three times a day (TID) | INTRAMUSCULAR | Status: DC

## 2015-02-11 MED ORDER — ADULT MULTIVITAMIN W/MINERALS CH: 1.0000 | ORAL_TABLET | Freq: Every day | ORAL | Status: AC

## 2015-02-11 MED ORDER — CIPROFLOXACIN IN D5W 200 MG/100ML IV SOLN: 200.0000 mg | INTRAVENOUS | Status: DC

## 2015-02-11 MED ORDER — LEVETIRACETAM 1000 MG PO TABS: 1000.0000 mg | ORAL_TABLET | Freq: Two times a day (BID) | ORAL | Status: DC

## 2015-02-11 MED ORDER — CITALOPRAM HYDROBROMIDE 40 MG PO TABS
40.0000 mg | ORAL_TABLET | Freq: Every day | ORAL | Status: DC
  Administered 2015-02-11: 40 mg via ORAL
  Filled 2015-02-11: qty 1

## 2015-02-11 MED ORDER — HYDRALAZINE HCL 50 MG PO TABS
50.0000 mg | ORAL_TABLET | Freq: Three times a day (TID) | ORAL | Status: DC
  Filled 2015-02-11 (×3): qty 1

## 2015-02-11 MED ORDER — OXYCODONE HCL 10 MG PO TABS: 10.0000 mg | ORAL_TABLET | ORAL | Status: DC | PRN

## 2015-02-11 MED ORDER — LANTHANUM CARBONATE 500 MG PO CHEW
500.0000 mg | CHEWABLE_TABLET | Freq: Three times a day (TID) | ORAL | Status: DC
Start: 2015-02-11 — End: 2015-04-22

## 2015-02-11 MED ORDER — OXYCODONE HCL 5 MG PO TABS: 10.0000 mg | ORAL_TABLET | ORAL | Status: DC | PRN

## 2015-02-11 MED ORDER — ASPIRIN 81 MG PO CHEW: 81.0000 mg | CHEWABLE_TABLET | Freq: Every day | ORAL | Status: DC

## 2015-02-11 MED ORDER — LORAZEPAM 2 MG/ML IJ SOLN
1.0000 mg | Freq: Four times a day (QID) | INTRAMUSCULAR | Status: DC
Start: 2015-02-11 — End: 2015-03-20

## 2015-02-11 MED ORDER — AMLODIPINE BESYLATE 10 MG PO TABS: 10.0000 mg | ORAL_TABLET | Freq: Every day | ORAL | Status: DC

## 2015-02-11 MED ORDER — LORAZEPAM 2 MG/ML IJ SOLN
1.0000 mg | Freq: Four times a day (QID) | INTRAMUSCULAR | Status: DC
  Administered 2015-02-11: 1 mg via INTRAVENOUS
  Filled 2015-02-11: qty 1

## 2015-02-11 NOTE — Care Management Note (Signed)
Case Management Note  Patient Details  Name: Jared [name] MRN: [id number] Date of Birth: April 12, 1956  Subjective/Objective:    Followup from conversation with wife on Monday concerning [facility].  Wife is agreeable for [facility], [facility], wants to stay at hospital.  [facility] notified, have a bed, able to take intermittent dialysis.  PCCM physician, Dr. Tyson Alias agrees with discharge to [facility] today.  Updated nurse on impending discharge to [facility].                 Action/Plan:   Expected Discharge Date:                  Expected Discharge Plan:  IP Rehab Facility  In-House Referral:     Discharge planning Services  CM Consult  Post Acute Care Choice:    Choice offered to:     DME Arranged:    DME Agency:     HH Arranged:    HH Agency:     Status of Service:  Completed, signed off  Medicare Important Message Given:  Yes-second notification given Date Medicare IM Given:    Medicare IM give by:    Date Additional Medicare IM Given:    Additional Medicare Important Message give by:     If discussed at Long Length of Stay Meetings, dates discussed:    Additional Comments:  Vangie Bicker, RN 02/11/2015, 11:54 AM

## 2015-02-11 NOTE — Progress Notes (Signed)
Patient to be transferred to [facility].  Report called to Vicenta Aly , RN.  Will monitor.

## 2015-02-11 NOTE — Progress Notes (Signed)
PULMONARY / CRITICAL CARE MEDICINE   Name: Jared [name] MRN: [id number] DOB: [birth year]    ADMISSION DATE:  01/08/2015 CONSULTATION DATE:  8/18  REFERRING MD :  EDP Dr Criss Alvine  CHIEF COMPLAINT:  Found down  BRIEF DESCRIPTION:   59 yo male had severe abdominal pain, and then found unresponsive at home.  In ER he was in shock, cyanotic, with lactic acidosis, and hyperkalemia. Initial suspicion was for PE, however VQ was low prob. Etiology for such acute deterioration remains unclear, but is presumed to be occult septic shock by exclusion of PE, MI, bowel ischemia. Has been recovering, however has required continued dialysis. 9/13 suffered respiratory arrest and seizures requiring intubation and ICU transfer. Transferred to the floor. Admit back to ICU on 9/13 after seizures, PRES syndrome. Intubated for resp failure.   STUDIES:  8/18 Echo > EF 45 to 50%, mod RV dilation, mod/severe RV dysfx, PAS 47 mmHg 8/18 Renal u/s >> b/l renal echogenicity 8/23 LHC - left circumflex, second obtuse marginal branch 100% occlusion  CT head/neck 9/18>> NEG acute   SIGNIFICANT EVENTS: 8/18 Admit, CCS, cardiology consulted; brief PEA arrest; laparotomy with placement of wound vac; started heparin gtt for presumed PE 8/19 Renal consulted. Duplex LE negative for DVT 8/20 CRRT  8/21 OR for abd wound closure  8/23  Ileus, vanc stopped 8/26  Tolerating intemittent HD - oliguric. Vanc Stopped 8/27  hypertensive - on hydralazine prn. Anuric. Being weaned off dilaudid.  8/28  Extubated  TPN.  9/5 to tele 9/13 Seizure, resp arrest > intubated > to ICU 9/17 extuibated 9/18-  Fell OOB overnight while agitated, CT head neg. Remains intermittently very agitated, improved on precedex.  Low grade fever.  9/20 - off precedex  SUBJECTIVE/OVERNIGHT:  precedex off, asking for his mama, making urine  VITAL SIGNS: Temp:  [97.8 F (36.6 C)-100.4 F (38 C)] 98 F (36.7 C) (09/21 0854) Pulse Rate:  [59-106] 106  (09/21 1000) Resp:  [16-31] 27 (09/21 1000) BP: (134-175)/(59-77) 136/69 mmHg (09/21 1000) SpO2:  [96 %-100 %] 98 % (09/21 1000) Weight:  [114.7 kg (252 lb 13.9 oz)] 114.7 kg (252 lb 13.9 oz) (09/21 0500) HEMODYNAMICS:   VENTILATOR SETTINGS:   INTAKE / OUTPUT:  Intake/Output Summary (Last 24 hours) at 02/11/15 1115 Last data filed at 02/11/15 1000  Gross per 24 hour  Intake 717.03 ml  Output   2825 ml  Net -2107.97 ml   Total I/O In: 280 [P.O.:240; I.V.:40] Out: 175 [Urine:175]  I/O last 3 completed shifts: In: 2591.5 [P.O.:730; I.V.:966.5; IV Piggyback:895] Out: 5000 [Urine:2825; Stool:2175]  PHYSICAL EXAMINATION: General: awake, in bed, more organized in thoughts Neuro: sedated on precedex, RASS 0  HEENT  No JVD Cardiovascular: RRR, no MRG. Lungs: CTA Abdomen: Obese, soft, surgical dressing in place, dry Musculoskeletal: No acute deformity.  LABS:  PULMONARY  Recent Labs Lab 02/04/15 1315 02/05/15 0420 02/07/15 0500  PHART 7.453* 7.486* 7.467*  PCO2ART 37.5 32.7* 35.9  PO2ART 57.0* 102* 107*  HCO3 26.2* 24.4* 25.6*  TCO2 27 25.4 26.7  O2SAT 90.0 97.9 98.2    CBC  Recent Labs Lab 02/09/15 0232 02/10/15 0256 02/11/15 0240  HGB 9.5* 9.2* 10.5*  HCT 29.6* 28.5* 32.0*  WBC 19.4* 17.3* 12.9*  PLT 142* 169 140*   COAGULATION No results for input(s): INR in the last 168 hours.  CARDIAC   No results for input(s): TROPONINI in the last 168 hours. No results for input(s): PROBNP in the last 168 hours.  CHEMISTRY  Recent Labs Lab 02/07/15 0249 02/08/15 0323 02/09/15 0232 02/10/15 0256 02/11/15 0240  NA 143 143 140 137 141  K 3.5 3.8 3.7 3.5 3.6  CL 106 108 107 106 110  CO2 27 26 22  20* 20*  GLUCOSE 100* 101* 91 92 97  BUN 44* 31* 40* 44* 43*  CREATININE 6.36* 4.15* 4.41* 4.07* 3.77*  CALCIUM 7.7* 7.9* 8.1* 7.9* 8.3*  MG 2.0 1.8 1.8 1.9 1.9  PHOS 4.6 5.3* 4.9* 3.9 3.1   Estimated Creatinine Clearance: 27.9 mL/min (by C-G formula based  on Cr of 3.77).  LIVER  Recent Labs Lab 02/06/15 0236 02/08/15 0323 02/09/15 0232 02/10/15 0256 02/11/15 0240  ALBUMIN 2.1* 2.2* 2.2* 2.0* 2.2*   INFECTIOUS  Recent Labs Lab 02/05/15 0250  PROCALCITON 25.19   ENDOCRINE CBG (last 3)   Recent Labs  02/10/15 2351 02/11/15 0411 02/11/15 0824  GLUCAP 91 93 98   IMAGING   ASSESSMENT / PLAN:  PULMONARY ETT 8/18 >8/28, 9/13 >>>9/18 A: Acute respiratory failure in setting seizure vs aspiration OSA ? PE essentailly ruled out with neg DVT and low prob VQ prior in admit P:   mobilize Supplemental O2 as needed  Liked neg balance 2.4 liters, to ra today likely  CARDIOVASCULAR Rt femoral CVL 8/18 >>8/20 Lt femoral A line 8/18 >>8/28 RIJ HD 8/20 >out LIJ 8/20 >? RIJ perm cath >>> A:  Hypertenson Elevated lactic acid > suspect due to siezure Shock > Resolved. Likely RV failure with possible PE.-although venous doppler neg for DVT  X Cath 8/06/25/14 OM branch occlusion per report and unlikely cause of admission RV failure AFRVR - resolved P:  Telemetry monitoring Continue nebivolol, norvasc  PRN hydralazine hydral to 50   RENAL A: ESRD new onset . Was on CRRT now intermittent HD dependent, - r/t septic shock related ATN- RF and pan autoimmune negative 01/14/15.  Renal recovery P:   Nephrology off Was neg 2.4 on own, and improving with this Lasix if output drops   GASTROINTESTINAL A: Acute abdominal pain s/p laparotomy with wound vac 8/18 >wound vac change 8/19 d/t eviceration>to OR 8/21 with wound closure Shock Liver > resolved Elevated triglycerides  P:  Renal diet advance  HEMATOLOGIC A: Anemia Coagulopathy with elevated INR probably secnodary to shock liver > resolved Thrombocytopenia -  HITT  Negative 01/13/15 s/p on angiomax 01/13/15 - 01/14/15 P:  Sub qhep Ambulate as able- PT active  INFECTIOUS Culture - negative HIV negative 01/14/15 A: Sepsis with presumed abdominal source.>exp lap was neg  .  Fever 9/19 UTI likley source Coag neg staph 1/2 contamination? Line> P:   Vanc completed 01/14/15 Zosyn stopped 01/17/15  Dc HD cath Follow id staph Continue vanc Change zosyn to cipro  I feel his urine was source, keep vanc for now, re assess in am need for IV vanc  ENDOCRINE A: Hyperglycemia  P:   CBG and SSI  NEUROLOGIC A: Status epilepticus PRES syndrome Acute encephalopathy. Metabolic + seizure component Hx  chronic back pain, anxiety, depression. Agitation (did well with ativan) Elevated triglycerides  P:   RASS goal: 0 On Keppra and Vimpat Seizure treatment per Neuro Holding outpt xanax Consider restart home celexa Limited options Updates: wife updated 9/18 Ativan to q6h  To triad sdu   .Jared [name]. Tyson Alias, MD, FACP Pgr: [phone] [facility]

## 2015-02-11 NOTE — Discharge Summary (Signed)
Physician Discharge Summary  Patient ID: Jared [name] MRN: [id number] DOB/AGE: [birth year] 59 y.o.  Admit date: 01/08/2015 Discharge date: 02/11/2015    Discharge Diagnoses:  Active Problems:   Septic shock   Acute respiratory failure with hypoxia   Shock   Acute respiratory failure with hypoxemia   Cardiogenic shock   AKI (acute kidney injury)   Renal failure   NSTEMI (non-ST elevated myocardial infarction)   Chronic back pain   Encephalopathy acute   Blood poisoning   Acute abdominal pain   ATN (acute tubular necrosis)   Metabolic acidosis   Hyperkalemia   Chronic kidney disease   Postoperative anemia due to acute blood loss   Thrombocytopenia   Shock liver   Paroxysmal atrial fibrillation   Acute systolic heart failure   Pulmonary hypertension   Hyperglycemia   Obstructive sleep apnea   Chronic atrial fibrillation   Chronic systolic heart failure   Delirium   Seizure   Leukocytosis   Abdominal pain   Acute respiratory failure    Brief Summary: Jared [name] is a 59 y.o. y/o male with a PMH of chronic back pain initially admitted 8/18 with abd pain/fulminant shock initially thought r/t intra-abdominal process taken emergently for exp lap which was neg. Abd remained open and pt returned to OR 8/21 for closure.  Subsequently r/o for PE, MI, bowel ischemia with ongoing treatment with IV abx for presumed sepsis/infectious etiology of initial shock.  Had essentially negative heart cath on 8/23 with no findings to explain initial presentation.  Had long course which was c/b acute kidney injury, now thought to be ESRD with no obvious renal recovery initially, on intermittent HD.  Now slowly recovering renal function, non-oliguric. He was progressing slowly, extubated and in SDU when he had seizure and respiratory arrest 9/13 thought r/t PRES syndrome, followed closely by neurology.  He was extubated a second time 9/17 and remained marginal r/t AMS and delirium requiring  precedex.  He has improved slowly, now off precedex and hemodynamically stable.  He remains on intermittent HD per renal.  Need ongoing aggressive rehab efforts for severe deconditioning.    STUDIES:  8/18 Echo > EF 45 to 50%, mod RV dilation, mod/severe RV dysfx, PAS 47 mmHg 8/18 Renal u/s >> b/l renal echogenicity 8/23 LHC - left circumflex, second obtuse marginal branch 100% occlusion  CT head/neck 9/18>> NEG acute   SIGNIFICANT EVENTS: 8/18 Admit, CCS, cardiology consulted; brief PEA arrest; laparotomy with placement of wound vac; started heparin gtt for presumed PE 8/19 Renal consulted. Duplex LE negative for DVT 8/20 CRRT  8/21 OR for abd wound closure  8/23 Ileus, vanc stopped 8/26 Tolerating intemittent HD - oliguric. Vanc Stopped 8/27 hypertensive - on hydralazine prn. Anuric. Being weaned off dilaudid.  8/28 Extubated TPN.  9/5 to tele 9/13 Seizure, resp arrest > intubated > to ICU 9/17 extuibated 9/18- Fell OOB overnight while agitated, CT head neg. Remains intermittently very agitated, improved on precedex. Low grade fever.  9/20 - off precedex  TUBES/LINES:  ETT 8/18 >8/28, 9/13 >>>9/18 Rt femoral CVL 8/18 >>8/20 Lt femoral A line 8/18 >>8/28 RIJ HD 8/20 >out LIJ 8/20 >out RIJ perm cath >>>  D/c plan by Discharge Diagnosis  Acute respiratory failure in setting seizure vs aspiration OSA ? PE essentailly ruled out with neg DVT and low prob VQ prior in admit P:  Mobilize Intermittent f/u CXR  Supplemental O2 as needed  Consider qhs cpap - have avoided r/t significant delirium but may be ready    Hypertenson Elevated lactic acid > suspect due to siezure Shock > Resolved. Likely RV failure with possible PE.-although venous doppler neg for DVT X Cath 8/06/25/14 OM branch occlusion per report and unlikely cause of admission RV failure AFRVR - resolved.  Second episode of AFib during  critical illness -- likely needs long term anticoagulation.  P:  Telemetry monitoring Continue PO nebivolol, norvasc, hydralazine  Outpt cards f/u - will determine as outpt re: ?anticoagulation    ESRD new onset . Was on CRRT now intermittent HD dependent, - r/t septic shock related ATN- RF and pan autoimmune negative 01/14/15.  Renal recovery  P:  Nephrology off Monitor renal function closely  Will d/c tunneled HD cath (ok with renal) especially with +BC  Lasix if output drops   Acute abdominal pain s/p laparotomy with wound vac 8/18 >wound vac change 8/19 d/t eviceration>to OR 8/21 with wound closure Shock Liver > resolved Elevated triglycerides  P:  PO renal diet    Anemia Coagulopathy with elevated INR probably secnodary to shock liver > resolved Thrombocytopenia - HITT Negative 01/13/15 s/p on angiomax 01/13/15 - 01/14/15 P:  Sub q hep Aggressive PT/OT     Sepsis with presumed abdominal source.>exp lap was neg .  Enterococcus UTI  +BC - 1/2 coag neg staph, suspect contaminant  P:  Continue cipro  Continue vanc for now - can likely d/c in am 9/21 if cultures unchanged   Status epilepticus PRES syndrome Acute encephalopathy. Metabolic + seizure component Hx chronic back pain, anxiety, depression. Agitation (did well with ativan) Elevated triglycerides  P:  On Keppra and Vimpat Seizure treatment per Neuro Holding outpt xanax restart home celexa PRN Ativan  Rehab    Filed Vitals:   02/11/15 1000 02/11/15 1100 02/11/15 1200 02/11/15 1205  BP: 136/69 140/85 141/77   Pulse: 106 102 102   Temp:    97.8 F (36.6 C)  TempSrc:    Oral  Resp: $Remo'27 26 22   'nJKWY$ Weight:      SpO2: 98% 98% 99%      Discharge Labs  BMET  Recent Labs Lab 02/07/15 0249 02/08/15 0323 02/09/15 0232 02/10/15 0256 02/11/15 0240  NA 143 143 140 137 141  K 3.5 3.8 3.7 3.5 3.6  CL 106 108 107 106 110  CO2 $Re'27 26 22 'ZXX$ 20* 20*  GLUCOSE 100* 101* 91 92 97  BUN 44* 31*  40* 44* 43*  CREATININE 6.36* 4.15* 4.41* 4.07* 3.77*  CALCIUM 7.7* 7.9* 8.1* 7.9* 8.3*  MG 2.0 1.8 1.8 1.9 1.9  PHOS 4.6 5.3* 4.9* 3.9 3.1     CBC   Recent Labs Lab 02/09/15 0232 02/10/15 0256 02/11/15 0240  HGB 9.5* 9.2* 10.5*  HCT 29.6* 28.5* 32.0*  WBC 19.4* 17.3* 12.9*  PLT 142* 169 140*   Anti-Coagulation No results for input(s): INR in the last 168 hours.           Follow-up Information    Follow up with TSUEI,MATTHEW K., MD. Schedule an appointment as soon as possible for a visit in 4 weeks.   Specialty:  General Surgery   Contact information:   [place]  [address] [place] [address] [phone]       Follow up with Alesia Richards, MD. Schedule an appointment as soon as possible for a visit in 1 week.   Specialty:  Internal Medicine   Contact information:   [address] [place] [address] [phone]          Medication List    STOP taking these medications        ALPRAZolam 1 MG tablet  Commonly known as:  XANAX     atorvastatin 40 MG tablet  Commonly known as:  LIPITOR     azelastine 0.1 % nasal spray  Commonly known as:  ASTELIN     bisoprolol-hydrochlorothiazide 10-6.25 MG per tablet  Commonly known as:  ZIAC     docusate sodium 100 MG capsule  Commonly known as:  COLACE     FLAXSEED OIL PO     fluticasone 50 MCG/ACT nasal spray  Commonly known as:  FLONASE     losartan-hydrochlorothiazide 100-25 MG per tablet  Commonly known as:  HYZAAR     minoxidil 10 MG tablet  Commonly known as:  LONITEN     pregabalin 75 MG capsule  Commonly known as:  LYRICA     testosterone cypionate 200 MG/ML injection  Commonly known as:  DEPOTESTOSTERONE CYPIONATE      TAKE these medications        albuterol 108 (90 BASE) MCG/ACT inhaler  Commonly known as:  PROVENTIL HFA;VENTOLIN HFA  Inhale 2 puffs into the lungs every 2 (two) hours as needed for wheezing or shortness of breath (cough).      amLODipine 10 MG tablet  Commonly known as:  NORVASC  Take 1 tablet (10 mg total) by mouth daily.     aspirin 81 MG chewable tablet  Place 1 tablet (81 mg total) into feeding tube daily.     ciprofloxacin 200 MG/100ML Soln  Commonly known as:  CIPRO  Inject 100 mLs (200 mg total) into the vein daily.     citalopram 40 MG tablet  Commonly known as:  CELEXA  TAKE 1 TABLET BY MOUTH EVERY DAY FOR MOOD     Darbepoetin Alfa 100 MCG/0.5ML Sosy injection  Commonly known as:  ARANESP  Inject 0.5 mLs (100 mcg total) into the vein every Tuesday with hemodialysis.     FISH OIL BURP-LESS PO  Take 1 tablet by mouth daily.     heparin 1000 unit/mL Soln injection  3 mLs (3,000 Units total) by Dialysis route as needed (in dialysis).     heparin 5000 UNIT/ML injection  Inject 1 mL (5,000 Units total) into the skin every 8 (eight) hours.     hydrALAZINE 50 MG tablet  Commonly known as:  APRESOLINE  Take 1 tablet (50 mg total) by mouth every 8 (eight) hours.     Lacosamide 100 MG Tabs  Take 1 tablet (100 mg total) by mouth 2 (two) times daily.     lanthanum 500 MG chewable tablet  Commonly known as:  FOSRENOL  Chew 1 tablet (500 mg total) by mouth 3 (three) times daily with meals.     levETIRAcetam 1000 MG tablet  Commonly known as:  KEPPRA  Take 1 tablet (1,000 mg total) by mouth 2 (two) times daily.     LORazepam 2 MG/ML injection  Commonly known as:  ATIVAN  Inject 0.5 mLs (1 mg total) into the vein every 6 (six) hours.     multivitamin with minerals Tabs  tablet  Take 1 tablet by mouth daily.     nebivolol 5 MG tablet  Commonly known as:  BYSTOLIC  Take 1 tablet (5 mg total) by mouth daily.     ondansetron 8 MG tablet  Commonly known as:  ZOFRAN  1/2 to 1 tablet up to 3 x daily as needed for nausea.     Oxycodone HCl 10 MG Tabs  Take 1 tablet (10 mg total) by mouth every 4 (four) hours as needed for moderate pain or severe pain.     senna 8.6 MG Tabs tablet  Commonly  known as:  SENOKOT  Take 2 tablets (17.2 mg total) by mouth at bedtime.     Vitamin D-3 5000 UNITS Tabs  Take 2,000 Units by mouth daily.          Disposition: 01-Home or Self Care  Discharged Condition: Jared [name] has met maximum benefit of inpatient care and is medically stable and cleared for discharge.  Patient is pending follow up as above.      Time spent on disposition:  Greater than 35 minutes.   SignedDarlina Sicilian, NP 02/11/2015  2:39 PM Pager: [phone] or [phone]  *Care during the described time interval was provided by me and/or other providers on the critical care team. I have reviewed this patient's available data, including medical history, events of note, physical examination and test results as part of my evaluation.     Agree with note See my management note from earlier in day Improved status i also called wife to update,left message  Lavon Paganini. Titus Mould, MD, [place] Pgr: [facility]

## 2015-02-11 NOTE — Progress Notes (Signed)
Patient ID: Jared [name], male   DOB: 06-May-1956, 59 y.o.   MRN: [id number]   [facility] Progress Note    Subjective:   Ongoing issues with agitation requiring restraints Last HD was 9/17 + diarrhea + blood cultures for GPC clusteres Renal function is continuing to slowly improve, remains non-oliguric    Objective:   BP 152/73 mmHg  Pulse 102  Temp(Src) 98.9 F (37.2 C) (Oral)  Resp 25  Wt 114.7 kg (252 lb 13.9 oz)  SpO2 100%  Intake/Output Summary (Last 24 hours) at 02/11/15 0827 Last data filed at 02/11/15 0700  Gross per 24 hour  Intake 776.47 ml  Output   3150 ml  Net -2373.53 ml   Weight change: -2.8 kg (-6 lb 2.8 oz)  Physical Exam:  Pale  WM TDC in place (01/29/15) CVS: Regular in rate and rhythm, S1 and S2 normal Resp: Coarse breath sounds Abd: Soft, obese, abdominal binder in place Ext: + LE edema  Labs: BMET  Recent Labs Lab 02/05/15 0250 02/06/15 0236 02/07/15 0249 02/08/15 0323 02/09/15 0232 02/10/15 0256 02/11/15 0240  NA 139 139  138 143 143 140 137 141  K 3.9 3.9  3.9 3.5 3.8 3.7 3.5 3.6  CL 101 103  101 106 108 107 106 110  CO2 20* 20*  GLUCOSE 92 83  82 100* 101* 91 92 97  BUN 52* 34*  34* 44* 31* 40* 44* 43*  CREATININE 9.55* 6.40*  6.34* 6.36* 4.15* 4.41* 4.07* 3.77*  CALCIUM 7.9* 7.7*  7.6* 7.7* 7.9* 8.1* 7.9* 8.3*  PHOS 6.8* 4.9* 4.6 5.3* 4.9* 3.9 3.1   CBC  Recent Labs Lab 02/08/15 0323 02/09/15 0232 02/10/15 0256 02/11/15 0240  WBC 11.2* 19.4* 17.3* 12.9*  NEUTROABS 9.7* 17.5* 15.6* 10.9*  HGB 9.6* 9.5* 9.2* 10.5*  HCT 29.5* 29.6* 28.5* 32.0*  MCV 91.0 90.2 90.2 89.4  PLT 106* 142* 169 140*    Blood culture+ GPC clusters  Medications:    . amLODipine  10 mg Oral Daily  . antiseptic oral rinse  7 mL Mouth Rinse BID  . aspirin  81 mg Per Tube Daily  . darbepoetin (ARANESP) injection - DIALYSIS  100 mcg Intravenous Q Tue-HD  . heparin subcutaneous  5,000 Units Subcutaneous  3 times per day  . hydrALAZINE  25 mg Oral 3 times per day  . lacosamide  100 mg Oral BID  . lanthanum  500 mg Oral TID WC  . levETIRAcetam  1,000 mg Oral BID  . LORazepam  1 mg Intravenous 3 times per day  . multivitamin with minerals  1 tablet Oral Daily  . nebivolol  5 mg Oral Daily  . piperacillin-tazobactam (ZOSYN)  IV  2.25 g Intravenous 3 times per day  . sodium chloride  10 mL Intravenous Q12H   Infusions: . sodium chloride 10 mL/hr at 02/10/15 1700  . dexmedetomidine Stopped (02/10/15 1545)    Assessment/ Plan:   1. Acute on chronic kidney disease stage II - due to septic shock related ATN- (creatinine 1.15 on July 27)-previously on CRRT and then transitioned to IHD and required RRT for total of 4 weeks - last HD was 9/17 and since that time his renal function has improved and he has remained non-oliguric.  I think OK to remove his TDC (esp in light of + blood cultures). May use lasix prn  2. CAD- cath revealed occluded OM, ongoing medical management 3. S/p exploratory  lap (negative) and fascia closure now s/p wound vac - per CCS/wound care 4. Nutrition: off TPN, taking some PO, continue oral nutritional supplementation 5. Anemia- hemoglobin stable status post PRBCs and Aranesp/intravenous iron 6. CKD/MBD-  PTH level acceptable  7. Seizure episode/PRES/AMS/encephalopathy - issues continue with agitation.  On precedex.  8. Acute respiratory failure:d/t sz vs aspiration. Remains off the vent  9. Fever/leukocytosis - GPC clusters on BC. Per CCM. Will need lines out.   With improving renal function and no further dialysis needs at this time, renal will sign off.  As above OK to have TDC removed. Please call me if questions arise.  Camille Bal, MD [facility] [phone] Pager 02/11/2015, 8:27 AM

## 2015-02-11 NOTE — Progress Notes (Signed)
ANTIBIOTIC CONSULT NOTE - F/U  Pharmacy Consult for vancomycin Indication: UTI  Allergies  Allergen Reactions  . Morphine And Related Anaphylaxis and Shortness Of Breath  . Montelukast Other (See Comments)    unknown  . Neurontin [Gabapentin] Other (See Comments)    delusional  . Wellbutrin [Bupropion] Palpitations    Insomnia    Patient Measurements: Weight: 252 lb 13.9 oz (114.7 kg)   Vital Signs: Temp: 97.8 F (36.6 C) (09/21 1205) Temp Source: Oral (09/21 1205) BP: 136/69 mmHg (09/21 1000) Pulse Rate: 106 (09/21 1000) Intake/Output from previous day: 09/20 0701 - 09/21 0700 In: 812.3 [P.O.:240; I.V.:422.3; IV Piggyback:150] Out: 3350 [Urine:1375; Stool:1975] Intake/Output from this shift: Total I/O In: 280 [P.O.:240; I.V.:40] Out: 425 [Urine:425]  Labs:  Recent Labs  02/09/15 0232 02/10/15 0256 02/11/15 0240  WBC 19.4* 17.3* 12.9*  HGB 9.5* 9.2* 10.5*  PLT 142* 169 140*  CREATININE 4.41* 4.07* 3.77*   Estimated Creatinine Clearance: 27.9 mL/min (by C-G formula based on Cr of 3.77).  Recent Labs  02/11/15 0240  [facility] 21     Microbiology: Assessment: 59 yo male had severe abdominal pain, and then found unresponsive at home. In ER he was in shock, cyanotic, with lactic acidosis, and hyperkalemia.   PMH: HTN, HLD, pre-diabetes, chronic pain and depression   Infectious Disease: s/p vanc/zosyn for prior sepsis with presume abd source, ex lap negative. WBC 12.9 Tmax 100.4   Vanc 8/18 >> 8/24; restart 8/30 >> 9/5, 9/20  Zosyn 8/18 >> 8/27, 9/20>>9/21 Cipro 9/21>>   9/1 BCx >> neg  8/30 BC x 2>>neg  8/30 PAC>neg  8/18 Blood cx >NEG  8/18 Urine cx >NEG  HIV neg  9/17 cdiff >> colonization  9/19 Urine Enterobacter Suscept to Cipro Gent Imipe  9/19 Blood x 2>> 1/2 CONS  VR 9/21 = 21 mcg/mL  Renal: SCr 3.77. No further HD needs. Improving UOP  Plan:  -CONS probably contaminant. Continue vanc x 1 more day? Random level therapeutic. No dose  today. Will f/u with repeat level 9/22 AM -D/C Zosyn -Cipro 250 mg IV q24h  Isaac Bliss, PharmD, [facility] Clinical Pharmacist Pager [phone] 02/11/2015 12:10 PM

## 2015-02-12 LAB — CBC WITH DIFFERENTIAL/PLATELET
HCT: 35.5 % — ABNORMAL LOW (ref 39.0–52.0)
Hemoglobin: 11.9 g/dL — ABNORMAL LOW (ref 13.0–17.0)
MCH: 30.2 pg (ref 26.0–34.0)
MCHC: 33.5 g/dL (ref 30.0–36.0)
MCV: 90.1 fL (ref 78.0–100.0)
PLATELETS: 220 10*3/uL (ref 150–400)
RBC: 3.94 MIL/uL — ABNORMAL LOW (ref 4.22–5.81)
RDW: 15 % (ref 11.5–15.5)
WBC: 10.7 10*3/uL — AB (ref 4.0–10.5)

## 2015-02-12 LAB — COMPREHENSIVE METABOLIC PANEL
ALT: UNDETERMINED U/L (ref 17–63)
AST: 45 U/L — AB (ref 15–41)
Albumin: 2.1 g/dL — ABNORMAL LOW (ref 3.5–5.0)
Alkaline Phosphatase: 134 U/L — ABNORMAL HIGH (ref 38–126)
Anion gap: 10 (ref 5–15)
BUN: 33 mg/dL — AB (ref 6–20)
CHLORIDE: 110 mmol/L (ref 101–111)
CO2: 18 mmol/L — ABNORMAL LOW (ref 22–32)
Calcium: 8.2 mg/dL — ABNORMAL LOW (ref 8.9–10.3)
Creatinine, Ser: 2.97 mg/dL — ABNORMAL HIGH (ref 0.61–1.24)
GFR calc Af Amer: 25 mL/min — ABNORMAL LOW (ref >60)
GFR, EST NON AFRICAN AMERICAN: 21 mL/min — AB (ref >60)
Glucose, Bld: 87 mg/dL (ref 65–99)
POTASSIUM: 4 mmol/L (ref 3.5–5.1)
Sodium: 138 mmol/L (ref 135–145)
Total Bilirubin: 1.1 mg/dL (ref 0.3–1.2)
Total Protein: 5.4 g/dL — ABNORMAL LOW (ref 6.5–8.1)

## 2015-02-12 LAB — PHOSPHORUS
PHOSPHORUS: UNDETERMINED mg/dL (ref 2.5–4.6)
PHOSPHORUS: 3 mg/dL (ref 2.5–4.6)

## 2015-02-12 LAB — TROPONIN I
TROPONIN I: UNDETERMINED ng/mL (ref <0.031)
Troponin I: 0.08 ng/mL — ABNORMAL HIGH (ref <0.031)

## 2015-02-12 LAB — MAGNESIUM
MAGNESIUM: UNDETERMINED mg/dL (ref 1.7–2.4)
MAGNESIUM: 1.6 mg/dL — AB (ref 1.7–2.4)

## 2015-02-12 LAB — CK TOTAL AND CKMB (NOT AT ARMC)
CK TOTAL: 50 U/L (ref 49–397)
CK, MB: 3.2 ng/mL (ref 0.5–5.0)
Relative Index: INVALID (ref 0.0–2.5)

## 2015-02-12 LAB — ALT: ALT: 52 U/L (ref 17–63)

## 2015-02-13 ENCOUNTER — Other Ambulatory Visit (HOSPITAL_COMMUNITY): Payer: Self-pay

## 2015-02-14 LAB — CULTURE, BLOOD (ROUTINE X 2): CULTURE: NO GROWTH

## 2015-02-15 LAB — BASIC METABOLIC PANEL
ANION GAP: 11 (ref 5–15)
BUN: 26 mg/dL — ABNORMAL HIGH (ref 6–20)
CALCIUM: 8.4 mg/dL — AB (ref 8.9–10.3)
CHLORIDE: 107 mmol/L (ref 101–111)
CO2: 20 mmol/L — AB (ref 22–32)
Creatinine, Ser: 2.53 mg/dL — ABNORMAL HIGH (ref 0.61–1.24)
GFR calc non Af Amer: 26 mL/min — ABNORMAL LOW (ref >60)
GFR, EST AFRICAN AMERICAN: 30 mL/min — AB (ref >60)
Glucose, Bld: 78 mg/dL (ref 65–99)
Potassium: 3.4 mmol/L — ABNORMAL LOW (ref 3.5–5.1)
Sodium: 138 mmol/L (ref 135–145)

## 2015-02-15 LAB — MAGNESIUM: MAGNESIUM: 1.4 mg/dL — AB (ref 1.7–2.4)

## 2015-02-15 LAB — CBC WITH DIFFERENTIAL/PLATELET
BASOS ABS: 0.1 10*3/uL (ref 0.0–0.1)
Basophils Relative: 1 %
Eosinophils Absolute: 0.7 10*3/uL (ref 0.0–0.7)
Eosinophils Relative: 5 %
HEMATOCRIT: 31 % — AB (ref 39.0–52.0)
HEMOGLOBIN: 10.2 g/dL — AB (ref 13.0–17.0)
LYMPHS PCT: 7 %
Lymphs Abs: 0.9 10*3/uL (ref 0.7–4.0)
MCH: 29.1 pg (ref 26.0–34.0)
MCHC: 32.9 g/dL (ref 30.0–36.0)
MCV: 88.6 fL (ref 78.0–100.0)
MONOS PCT: 8 %
Monocytes Absolute: 1 10*3/uL (ref 0.1–1.0)
NEUTROS PCT: 79 %
Neutro Abs: 10.3 10*3/uL — ABNORMAL HIGH (ref 1.7–7.7)
Platelets: 250 10*3/uL (ref 150–400)
RBC: 3.5 MIL/uL — AB (ref 4.22–5.81)
RDW: 14.4 % (ref 11.5–15.5)
WBC: 13 10*3/uL — AB (ref 4.0–10.5)

## 2015-02-15 LAB — PHOSPHORUS: PHOSPHORUS: 2.6 mg/dL (ref 2.5–4.6)

## 2015-02-16 LAB — POTASSIUM: Potassium: 3.9 mmol/L (ref 3.5–5.1)

## 2015-02-19 LAB — RENAL FUNCTION PANEL
ANION GAP: 9 (ref 5–15)
Albumin: 2.3 g/dL — ABNORMAL LOW (ref 3.5–5.0)
BUN: 20 mg/dL (ref 6–20)
CALCIUM: 9 mg/dL (ref 8.9–10.3)
CHLORIDE: 107 mmol/L (ref 101–111)
CO2: 23 mmol/L (ref 22–32)
Creatinine, Ser: 1.99 mg/dL — ABNORMAL HIGH (ref 0.61–1.24)
GFR calc non Af Amer: 34 mL/min — ABNORMAL LOW (ref >60)
GFR, EST AFRICAN AMERICAN: 40 mL/min — AB (ref >60)
Glucose, Bld: 100 mg/dL — ABNORMAL HIGH (ref 65–99)
POTASSIUM: 3.2 mmol/L — AB (ref 3.5–5.1)
Phosphorus: 2.8 mg/dL (ref 2.5–4.6)
Sodium: 139 mmol/L (ref 135–145)

## 2015-02-19 LAB — CBC WITH DIFFERENTIAL/PLATELET
Basophils Absolute: 0.1 10*3/uL (ref 0.0–0.1)
Basophils Relative: 1 %
Eosinophils Absolute: 1 10*3/uL — ABNORMAL HIGH (ref 0.0–0.7)
Eosinophils Relative: 11 %
HEMATOCRIT: 28.1 % — AB (ref 39.0–52.0)
HEMOGLOBIN: 9.4 g/dL — AB (ref 13.0–17.0)
LYMPHS ABS: 0.9 10*3/uL (ref 0.7–4.0)
LYMPHS PCT: 10 %
MCH: 29 pg (ref 26.0–34.0)
MCHC: 33.5 g/dL (ref 30.0–36.0)
MCV: 86.7 fL (ref 78.0–100.0)
Monocytes Absolute: 0.5 10*3/uL (ref 0.1–1.0)
Monocytes Relative: 5 %
NEUTROS PCT: 73 %
Neutro Abs: 6.6 10*3/uL (ref 1.7–7.7)
Platelets: 294 10*3/uL (ref 150–400)
RBC: 3.24 MIL/uL — AB (ref 4.22–5.81)
RDW: 14.3 % (ref 11.5–15.5)
WBC: 8.9 10*3/uL (ref 4.0–10.5)

## 2015-02-20 LAB — MAGNESIUM: MAGNESIUM: 1.8 mg/dL (ref 1.7–2.4)

## 2015-02-20 LAB — POTASSIUM: POTASSIUM: 3.3 mmol/L — AB (ref 3.5–5.1)

## 2015-02-21 LAB — POTASSIUM: Potassium: 3.7 mmol/L (ref 3.5–5.1)

## 2015-02-22 ENCOUNTER — Other Ambulatory Visit (HOSPITAL_COMMUNITY): Payer: Self-pay

## 2015-02-22 DIAGNOSIS — R109 Unspecified abdominal pain: Secondary | ICD-10-CM | POA: Diagnosis not present

## 2015-02-23 ENCOUNTER — Other Ambulatory Visit (HOSPITAL_COMMUNITY): Payer: Self-pay

## 2015-02-23 DIAGNOSIS — R220 Localized swelling, mass and lump, head: Secondary | ICD-10-CM | POA: Diagnosis not present

## 2015-02-23 DIAGNOSIS — R269 Unspecified abnormalities of gait and mobility: Secondary | ICD-10-CM | POA: Diagnosis not present

## 2015-02-24 LAB — RENAL FUNCTION PANEL
Albumin: 2.6 g/dL — ABNORMAL LOW (ref 3.5–5.0)
Anion gap: 8 (ref 5–15)
BUN: 12 mg/dL (ref 6–20)
CALCIUM: 9.3 mg/dL (ref 8.9–10.3)
CHLORIDE: 107 mmol/L (ref 101–111)
CO2: 25 mmol/L (ref 22–32)
CREATININE: 1.79 mg/dL — AB (ref 0.61–1.24)
GFR calc non Af Amer: 39 mL/min — ABNORMAL LOW (ref >60)
GFR, EST AFRICAN AMERICAN: 45 mL/min — AB (ref >60)
GLUCOSE: 97 mg/dL (ref 65–99)
Phosphorus: 3.5 mg/dL (ref 2.5–4.6)
Potassium: 3.6 mmol/L (ref 3.5–5.1)
Sodium: 140 mmol/L (ref 135–145)

## 2015-02-24 LAB — CBC
HCT: 28.6 % — ABNORMAL LOW (ref 39.0–52.0)
Hemoglobin: 9.4 g/dL — ABNORMAL LOW (ref 13.0–17.0)
MCH: 28.8 pg (ref 26.0–34.0)
MCHC: 32.9 g/dL (ref 30.0–36.0)
MCV: 87.7 fL (ref 78.0–100.0)
PLATELETS: 316 10*3/uL (ref 150–400)
RBC: 3.26 MIL/uL — AB (ref 4.22–5.81)
RDW: 14.7 % (ref 11.5–15.5)
WBC: 7.8 10*3/uL (ref 4.0–10.5)

## 2015-03-02 ENCOUNTER — Other Ambulatory Visit (HOSPITAL_COMMUNITY): Payer: Medicare Other

## 2015-03-02 DIAGNOSIS — Z4901 Encounter for fitting and adjustment of extracorporeal dialysis catheter: Secondary | ICD-10-CM | POA: Diagnosis not present

## 2015-03-02 LAB — CBC WITH DIFFERENTIAL/PLATELET
BASOS PCT: 2 %
Basophils Absolute: 0.1 10*3/uL (ref 0.0–0.1)
EOS ABS: 0.4 10*3/uL (ref 0.0–0.7)
EOS PCT: 6 %
HCT: 28.1 % — ABNORMAL LOW (ref 39.0–52.0)
Hemoglobin: 9.3 g/dL — ABNORMAL LOW (ref 13.0–17.0)
LYMPHS ABS: 1.3 10*3/uL (ref 0.7–4.0)
Lymphocytes Relative: 18 %
MCH: 29.2 pg (ref 26.0–34.0)
MCHC: 33.1 g/dL (ref 30.0–36.0)
MCV: 88.1 fL (ref 78.0–100.0)
MONO ABS: 0.4 10*3/uL (ref 0.1–1.0)
Monocytes Relative: 6 %
NEUTROS ABS: 5 10*3/uL (ref 1.7–7.7)
Neutrophils Relative %: 68 %
PLATELETS: 254 10*3/uL (ref 150–400)
RBC: 3.19 MIL/uL — ABNORMAL LOW (ref 4.22–5.81)
RDW: 14.7 % (ref 11.5–15.5)
WBC: 7.2 10*3/uL (ref 4.0–10.5)

## 2015-03-02 LAB — PHOSPHORUS: PHOSPHORUS: 3.6 mg/dL (ref 2.5–4.6)

## 2015-03-02 LAB — COMPREHENSIVE METABOLIC PANEL
ALK PHOS: 87 U/L (ref 38–126)
ALT: 24 U/L (ref 17–63)
ANION GAP: 11 (ref 5–15)
AST: 29 U/L (ref 15–41)
Albumin: 2.7 g/dL — ABNORMAL LOW (ref 3.5–5.0)
BUN: 16 mg/dL (ref 6–20)
CALCIUM: 9.5 mg/dL (ref 8.9–10.3)
CHLORIDE: 101 mmol/L (ref 101–111)
CO2: 26 mmol/L (ref 22–32)
CREATININE: 1.68 mg/dL — AB (ref 0.61–1.24)
GFR, EST AFRICAN AMERICAN: 49 mL/min — AB (ref >60)
GFR, EST NON AFRICAN AMERICAN: 42 mL/min — AB (ref >60)
Glucose, Bld: 83 mg/dL (ref 65–99)
Potassium: 4.8 mmol/L (ref 3.5–5.1)
SODIUM: 138 mmol/L (ref 135–145)
Total Bilirubin: 0.7 mg/dL (ref 0.3–1.2)
Total Protein: 5.9 g/dL — ABNORMAL LOW (ref 6.5–8.1)

## 2015-03-02 LAB — MAGNESIUM: MAGNESIUM: 2.1 mg/dL (ref 1.7–2.4)

## 2015-03-02 MED ORDER — CHLORHEXIDINE GLUCONATE 4 % EX LIQD
CUTANEOUS | Status: AC
  Filled 2015-03-02: qty 15

## 2015-03-02 MED ORDER — LIDOCAINE HCL 1 % IJ SOLN
INTRAMUSCULAR | Status: AC
  Filled 2015-03-02: qty 20

## 2015-03-02 NOTE — Procedures (Signed)
RIJV HD catheter No comp/EBL

## 2015-03-06 DIAGNOSIS — I2510 Atherosclerotic heart disease of native coronary artery without angina pectoris: Secondary | ICD-10-CM | POA: Diagnosis not present

## 2015-03-06 DIAGNOSIS — I480 Paroxysmal atrial fibrillation: Secondary | ICD-10-CM | POA: Diagnosis not present

## 2015-03-06 DIAGNOSIS — I5020 Unspecified systolic (congestive) heart failure: Secondary | ICD-10-CM | POA: Diagnosis not present

## 2015-03-06 DIAGNOSIS — I129 Hypertensive chronic kidney disease with stage 1 through stage 4 chronic kidney disease, or unspecified chronic kidney disease: Secondary | ICD-10-CM | POA: Diagnosis not present

## 2015-03-06 DIAGNOSIS — Z48815 Encounter for surgical aftercare following surgery on the digestive system: Secondary | ICD-10-CM | POA: Diagnosis not present

## 2015-03-06 DIAGNOSIS — G40911 Epilepsy, unspecified, intractable, with status epilepticus: Secondary | ICD-10-CM | POA: Diagnosis not present

## 2015-03-09 ENCOUNTER — Telehealth: Payer: Self-pay | Admitting: Family Medicine

## 2015-03-09 DIAGNOSIS — I480 Paroxysmal atrial fibrillation: Secondary | ICD-10-CM | POA: Diagnosis not present

## 2015-03-09 DIAGNOSIS — I2510 Atherosclerotic heart disease of native coronary artery without angina pectoris: Secondary | ICD-10-CM | POA: Diagnosis not present

## 2015-03-09 DIAGNOSIS — G40911 Epilepsy, unspecified, intractable, with status epilepticus: Secondary | ICD-10-CM | POA: Diagnosis not present

## 2015-03-09 DIAGNOSIS — I5020 Unspecified systolic (congestive) heart failure: Secondary | ICD-10-CM | POA: Diagnosis not present

## 2015-03-09 DIAGNOSIS — I129 Hypertensive chronic kidney disease with stage 1 through stage 4 chronic kidney disease, or unspecified chronic kidney disease: Secondary | ICD-10-CM | POA: Diagnosis not present

## 2015-03-09 DIAGNOSIS — Z48815 Encounter for surgical aftercare following surgery on the digestive system: Secondary | ICD-10-CM | POA: Diagnosis not present

## 2015-03-09 NOTE — Telephone Encounter (Signed)
So I am willing to take him as patient but will not be able to have him in before mid November. Could establish with Sandford Craze or Malva Cogan also

## 2015-03-09 NOTE — Telephone Encounter (Signed)
Pt wife called in inquiring about Dr. Abner Greenspan to be PCP for pt. I scheduled first new pt appt (08/18/14). She said pt was in the hospital for 2 months and they need to be seen by 04/04/15. I informed her I have no open new pt appts within that time period and advised they contact current PCP. She said they want to change and asked if Dr. Abner Greenspan can find time or recommend another provider within our office as PCP. Please advise.

## 2015-03-10 DIAGNOSIS — I480 Paroxysmal atrial fibrillation: Secondary | ICD-10-CM | POA: Diagnosis not present

## 2015-03-10 DIAGNOSIS — I5020 Unspecified systolic (congestive) heart failure: Secondary | ICD-10-CM | POA: Diagnosis not present

## 2015-03-10 DIAGNOSIS — Z48815 Encounter for surgical aftercare following surgery on the digestive system: Secondary | ICD-10-CM | POA: Diagnosis not present

## 2015-03-10 DIAGNOSIS — I2510 Atherosclerotic heart disease of native coronary artery without angina pectoris: Secondary | ICD-10-CM | POA: Diagnosis not present

## 2015-03-10 DIAGNOSIS — I129 Hypertensive chronic kidney disease with stage 1 through stage 4 chronic kidney disease, or unspecified chronic kidney disease: Secondary | ICD-10-CM | POA: Diagnosis not present

## 2015-03-10 DIAGNOSIS — G40911 Epilepsy, unspecified, intractable, with status epilepticus: Secondary | ICD-10-CM | POA: Diagnosis not present

## 2015-03-10 NOTE — Telephone Encounter (Signed)
Per wife chose to establish with [facility].

## 2015-03-11 DIAGNOSIS — I5020 Unspecified systolic (congestive) heart failure: Secondary | ICD-10-CM | POA: Diagnosis not present

## 2015-03-11 DIAGNOSIS — G40911 Epilepsy, unspecified, intractable, with status epilepticus: Secondary | ICD-10-CM | POA: Diagnosis not present

## 2015-03-11 DIAGNOSIS — I129 Hypertensive chronic kidney disease with stage 1 through stage 4 chronic kidney disease, or unspecified chronic kidney disease: Secondary | ICD-10-CM | POA: Diagnosis not present

## 2015-03-11 DIAGNOSIS — I480 Paroxysmal atrial fibrillation: Secondary | ICD-10-CM | POA: Diagnosis not present

## 2015-03-11 DIAGNOSIS — Z48815 Encounter for surgical aftercare following surgery on the digestive system: Secondary | ICD-10-CM | POA: Diagnosis not present

## 2015-03-11 DIAGNOSIS — I2510 Atherosclerotic heart disease of native coronary artery without angina pectoris: Secondary | ICD-10-CM | POA: Diagnosis not present

## 2015-03-12 DIAGNOSIS — I480 Paroxysmal atrial fibrillation: Secondary | ICD-10-CM | POA: Diagnosis not present

## 2015-03-12 DIAGNOSIS — I5020 Unspecified systolic (congestive) heart failure: Secondary | ICD-10-CM | POA: Diagnosis not present

## 2015-03-12 DIAGNOSIS — Z48815 Encounter for surgical aftercare following surgery on the digestive system: Secondary | ICD-10-CM | POA: Diagnosis not present

## 2015-03-12 DIAGNOSIS — I129 Hypertensive chronic kidney disease with stage 1 through stage 4 chronic kidney disease, or unspecified chronic kidney disease: Secondary | ICD-10-CM | POA: Diagnosis not present

## 2015-03-12 DIAGNOSIS — G40911 Epilepsy, unspecified, intractable, with status epilepticus: Secondary | ICD-10-CM | POA: Diagnosis not present

## 2015-03-12 DIAGNOSIS — I2510 Atherosclerotic heart disease of native coronary artery without angina pectoris: Secondary | ICD-10-CM | POA: Diagnosis not present

## 2015-03-13 DIAGNOSIS — I5020 Unspecified systolic (congestive) heart failure: Secondary | ICD-10-CM | POA: Diagnosis not present

## 2015-03-13 DIAGNOSIS — I2510 Atherosclerotic heart disease of native coronary artery without angina pectoris: Secondary | ICD-10-CM | POA: Diagnosis not present

## 2015-03-13 DIAGNOSIS — Z48815 Encounter for surgical aftercare following surgery on the digestive system: Secondary | ICD-10-CM | POA: Diagnosis not present

## 2015-03-13 DIAGNOSIS — I480 Paroxysmal atrial fibrillation: Secondary | ICD-10-CM | POA: Diagnosis not present

## 2015-03-13 DIAGNOSIS — I129 Hypertensive chronic kidney disease with stage 1 through stage 4 chronic kidney disease, or unspecified chronic kidney disease: Secondary | ICD-10-CM | POA: Diagnosis not present

## 2015-03-13 DIAGNOSIS — G40911 Epilepsy, unspecified, intractable, with status epilepticus: Secondary | ICD-10-CM | POA: Diagnosis not present

## 2015-03-16 DIAGNOSIS — I480 Paroxysmal atrial fibrillation: Secondary | ICD-10-CM | POA: Diagnosis not present

## 2015-03-16 DIAGNOSIS — I129 Hypertensive chronic kidney disease with stage 1 through stage 4 chronic kidney disease, or unspecified chronic kidney disease: Secondary | ICD-10-CM | POA: Diagnosis not present

## 2015-03-16 DIAGNOSIS — I2510 Atherosclerotic heart disease of native coronary artery without angina pectoris: Secondary | ICD-10-CM | POA: Diagnosis not present

## 2015-03-16 DIAGNOSIS — I5020 Unspecified systolic (congestive) heart failure: Secondary | ICD-10-CM | POA: Diagnosis not present

## 2015-03-16 DIAGNOSIS — Z48815 Encounter for surgical aftercare following surgery on the digestive system: Secondary | ICD-10-CM | POA: Diagnosis not present

## 2015-03-16 DIAGNOSIS — G40911 Epilepsy, unspecified, intractable, with status epilepticus: Secondary | ICD-10-CM | POA: Diagnosis not present

## 2015-03-18 DIAGNOSIS — I480 Paroxysmal atrial fibrillation: Secondary | ICD-10-CM | POA: Diagnosis not present

## 2015-03-18 DIAGNOSIS — G40911 Epilepsy, unspecified, intractable, with status epilepticus: Secondary | ICD-10-CM | POA: Diagnosis not present

## 2015-03-18 DIAGNOSIS — Z48815 Encounter for surgical aftercare following surgery on the digestive system: Secondary | ICD-10-CM | POA: Diagnosis not present

## 2015-03-18 DIAGNOSIS — I2510 Atherosclerotic heart disease of native coronary artery without angina pectoris: Secondary | ICD-10-CM | POA: Diagnosis not present

## 2015-03-18 DIAGNOSIS — I5020 Unspecified systolic (congestive) heart failure: Secondary | ICD-10-CM | POA: Diagnosis not present

## 2015-03-18 DIAGNOSIS — I129 Hypertensive chronic kidney disease with stage 1 through stage 4 chronic kidney disease, or unspecified chronic kidney disease: Secondary | ICD-10-CM | POA: Diagnosis not present

## 2015-03-19 ENCOUNTER — Telehealth: Payer: Self-pay

## 2015-03-19 DIAGNOSIS — I5020 Unspecified systolic (congestive) heart failure: Secondary | ICD-10-CM | POA: Diagnosis not present

## 2015-03-19 DIAGNOSIS — G40911 Epilepsy, unspecified, intractable, with status epilepticus: Secondary | ICD-10-CM | POA: Diagnosis not present

## 2015-03-19 DIAGNOSIS — I2510 Atherosclerotic heart disease of native coronary artery without angina pectoris: Secondary | ICD-10-CM | POA: Diagnosis not present

## 2015-03-19 DIAGNOSIS — I129 Hypertensive chronic kidney disease with stage 1 through stage 4 chronic kidney disease, or unspecified chronic kidney disease: Secondary | ICD-10-CM | POA: Diagnosis not present

## 2015-03-19 DIAGNOSIS — I480 Paroxysmal atrial fibrillation: Secondary | ICD-10-CM | POA: Diagnosis not present

## 2015-03-19 DIAGNOSIS — Z48815 Encounter for surgical aftercare following surgery on the digestive system: Secondary | ICD-10-CM | POA: Diagnosis not present

## 2015-03-19 NOTE — Telephone Encounter (Signed)
Pre Visit call completed. 

## 2015-03-20 ENCOUNTER — Other Ambulatory Visit: Payer: Self-pay | Admitting: Internal Medicine

## 2015-03-20 ENCOUNTER — Encounter: Payer: Self-pay | Admitting: Physician Assistant

## 2015-03-20 ENCOUNTER — Ambulatory Visit (INDEPENDENT_AMBULATORY_CARE_PROVIDER_SITE_OTHER): Payer: Medicare Other | Admitting: Physician Assistant

## 2015-03-20 VITALS — BP 111/55 | HR 56 | Temp 97.8°F | Ht 76.0 in | Wt 226.8 lb

## 2015-03-20 DIAGNOSIS — I10 Essential (primary) hypertension: Secondary | ICD-10-CM | POA: Diagnosis not present

## 2015-03-20 DIAGNOSIS — I639 Cerebral infarction, unspecified: Secondary | ICD-10-CM | POA: Diagnosis not present

## 2015-03-20 DIAGNOSIS — R8290 Unspecified abnormal findings in urine: Secondary | ICD-10-CM | POA: Diagnosis not present

## 2015-03-20 DIAGNOSIS — R569 Unspecified convulsions: Secondary | ICD-10-CM | POA: Diagnosis not present

## 2015-03-20 MED ORDER — LORAZEPAM 0.5 MG PO TABS
0.5000 mg | ORAL_TABLET | Freq: Two times a day (BID) | ORAL | Status: DC
Start: 2015-03-20 — End: 2015-04-22

## 2015-03-20 MED ORDER — NEBIVOLOL HCL 5 MG PO TABS: 5.0000 mg | ORAL_TABLET | Freq: Every day | ORAL | Status: DC

## 2015-03-20 MED ORDER — AMLODIPINE BESYLATE 10 MG PO TABS: 10.0000 mg | ORAL_TABLET | Freq: Every day | ORAL | Status: DC

## 2015-03-20 MED ORDER — LEVETIRACETAM 1000 MG PO TABS: 1000.0000 mg | ORAL_TABLET | Freq: Two times a day (BID) | ORAL | Status: DC

## 2015-03-20 MED ORDER — OXYCODONE HCL 15 MG PO TABS: 15.0000 mg | ORAL_TABLET | Freq: Three times a day (TID) | ORAL | Status: DC | PRN

## 2015-03-20 MED ORDER — CITALOPRAM HYDROBROMIDE 40 MG PO TABS: ORAL_TABLET | ORAL | Status: DC

## 2015-03-20 NOTE — Progress Notes (Signed)
Pre visit review using our clinic review tool, if applicable. No additional management support is needed unless otherwise documented below in the visit note. 

## 2015-03-20 NOTE — Patient Instructions (Signed)
Please continue medications as directed with the following exceptions: -- We are decreasing the Hydralazine to 2 tablets (50 mg) three times daily. -- Start the new dose of Oxycodone as directed.  Continue physical therapy as scheduled. You will be contacted for assessment regarding need for Keppra.

## 2015-03-23 ENCOUNTER — Other Ambulatory Visit: Payer: Medicare Other

## 2015-03-23 ENCOUNTER — Telehealth: Payer: Self-pay | Admitting: *Deleted

## 2015-03-23 DIAGNOSIS — N390 Urinary tract infection, site not specified: Secondary | ICD-10-CM | POA: Diagnosis not present

## 2015-03-23 DIAGNOSIS — R8290 Unspecified abnormal findings in urine: Secondary | ICD-10-CM | POA: Diagnosis not present

## 2015-03-23 LAB — POCT URINALYSIS DIPSTICK
BILIRUBIN UA: NEGATIVE
GLUCOSE UA: NEGATIVE
Ketones, UA: NEGATIVE
Nitrite, UA: NEGATIVE
PH UA: 6
Protein, UA: NEGATIVE
RBC UA: NEGATIVE
SPEC GRAV UA: 1.01
Urobilinogen, UA: 0.2

## 2015-03-23 MED ORDER — CIPROFLOXACIN HCL 500 MG PO TABS: 500.0000 mg | ORAL_TABLET | Freq: Two times a day (BID) | ORAL | Status: DC

## 2015-03-23 NOTE — Telephone Encounter (Signed)
Rx sent 

## 2015-03-23 NOTE — Telephone Encounter (Signed)
Patient informed, understood & agreed; no hemodialysis presently; please forward ABX to [facility] pharmacy per patient request/SLS

## 2015-03-23 NOTE — Telephone Encounter (Signed)
Reviewed EMR -- renal failure was secondary to sepsis. Has resolved and no further need for HD. Will send in Cipro 500 mg BID x 7 days.

## 2015-03-23 NOTE — Telephone Encounter (Signed)
-----   Message from Waldon Merl, PA-C sent at 03/23/2015 12:37 PM EDT ----- I see patient brought in urine specimen. Urine dip + large amount of leukocytes. Urine culture in progress. Would like to go ahead and send in antibiotic while culture is pending. Is he still getting hemodialysis presently? This will affect dosing. Let him know I will send in the correct dose for him as soon as I get this information.

## 2015-03-24 ENCOUNTER — Ambulatory Visit: Payer: Self-pay | Admitting: Internal Medicine

## 2015-03-24 DIAGNOSIS — Z48815 Encounter for surgical aftercare following surgery on the digestive system: Secondary | ICD-10-CM | POA: Diagnosis not present

## 2015-03-24 DIAGNOSIS — I5020 Unspecified systolic (congestive) heart failure: Secondary | ICD-10-CM | POA: Diagnosis not present

## 2015-03-24 DIAGNOSIS — G40911 Epilepsy, unspecified, intractable, with status epilepticus: Secondary | ICD-10-CM | POA: Diagnosis not present

## 2015-03-24 DIAGNOSIS — I480 Paroxysmal atrial fibrillation: Secondary | ICD-10-CM | POA: Diagnosis not present

## 2015-03-24 DIAGNOSIS — I129 Hypertensive chronic kidney disease with stage 1 through stage 4 chronic kidney disease, or unspecified chronic kidney disease: Secondary | ICD-10-CM | POA: Diagnosis not present

## 2015-03-24 DIAGNOSIS — I2510 Atherosclerotic heart disease of native coronary artery without angina pectoris: Secondary | ICD-10-CM | POA: Diagnosis not present

## 2015-03-24 NOTE — Telephone Encounter (Signed)
Pre visit call completed 

## 2015-03-25 ENCOUNTER — Ambulatory Visit: Payer: Medicare Other | Admitting: Physician Assistant

## 2015-03-25 DIAGNOSIS — I2510 Atherosclerotic heart disease of native coronary artery without angina pectoris: Secondary | ICD-10-CM | POA: Diagnosis not present

## 2015-03-25 DIAGNOSIS — I129 Hypertensive chronic kidney disease with stage 1 through stage 4 chronic kidney disease, or unspecified chronic kidney disease: Secondary | ICD-10-CM | POA: Diagnosis not present

## 2015-03-25 DIAGNOSIS — G40911 Epilepsy, unspecified, intractable, with status epilepticus: Secondary | ICD-10-CM | POA: Diagnosis not present

## 2015-03-25 DIAGNOSIS — I480 Paroxysmal atrial fibrillation: Secondary | ICD-10-CM | POA: Diagnosis not present

## 2015-03-25 DIAGNOSIS — I5020 Unspecified systolic (congestive) heart failure: Secondary | ICD-10-CM | POA: Diagnosis not present

## 2015-03-25 DIAGNOSIS — Z48815 Encounter for surgical aftercare following surgery on the digestive system: Secondary | ICD-10-CM | POA: Diagnosis not present

## 2015-03-26 ENCOUNTER — Telehealth: Payer: Self-pay

## 2015-03-26 DIAGNOSIS — I5020 Unspecified systolic (congestive) heart failure: Secondary | ICD-10-CM | POA: Diagnosis not present

## 2015-03-26 DIAGNOSIS — G40911 Epilepsy, unspecified, intractable, with status epilepticus: Secondary | ICD-10-CM | POA: Diagnosis not present

## 2015-03-26 DIAGNOSIS — I129 Hypertensive chronic kidney disease with stage 1 through stage 4 chronic kidney disease, or unspecified chronic kidney disease: Secondary | ICD-10-CM | POA: Diagnosis not present

## 2015-03-26 DIAGNOSIS — I480 Paroxysmal atrial fibrillation: Secondary | ICD-10-CM | POA: Diagnosis not present

## 2015-03-26 DIAGNOSIS — I2510 Atherosclerotic heart disease of native coronary artery without angina pectoris: Secondary | ICD-10-CM | POA: Diagnosis not present

## 2015-03-26 DIAGNOSIS — Z48815 Encounter for surgical aftercare following surgery on the digestive system: Secondary | ICD-10-CM | POA: Diagnosis not present

## 2015-03-26 LAB — URINE CULTURE

## 2015-03-26 NOTE — Telephone Encounter (Signed)
Spoke with patients wife regarding test results. Says patient still sleeping. Agreed to return when ABT completed.

## 2015-03-30 ENCOUNTER — Telehealth: Payer: Self-pay | Admitting: *Deleted

## 2015-03-30 DIAGNOSIS — K2100 Gastro-esophageal reflux disease with esophagitis, without bleeding: Secondary | ICD-10-CM

## 2015-03-30 MED ORDER — AMLODIPINE BESYLATE 10 MG PO TABS: 10.0000 mg | ORAL_TABLET | Freq: Every day | ORAL | Status: DC

## 2015-03-30 MED ORDER — FAMOTIDINE 20 MG PO TABS: 20.0000 mg | ORAL_TABLET | Freq: Two times a day (BID) | ORAL | Status: DC

## 2015-03-30 NOTE — Telephone Encounter (Signed)
Medication Detail      Disp Refills Start End     amLODipine (NORVASC) 10 MG tablet 30 tablet 5 03/20/2015     Sig - Route: Take 1 tablet (10 mg total) by mouth daily. - Oral    Class: No Print   Pharmacy    [facility] [place] - [address]   FYI: Pepcid is new, previously px for Reflux Esophagitis, no contraindication with current medications, sent in 30-day supply/SLS

## 2015-03-31 ENCOUNTER — Telehealth: Payer: Self-pay | Admitting: Physician Assistant

## 2015-03-31 MED ORDER — LEVETIRACETAM 1000 MG PO TABS: 1000.0000 mg | ORAL_TABLET | Freq: Two times a day (BID) | ORAL | Status: DC

## 2015-03-31 NOTE — Telephone Encounter (Signed)
Caller name:Woolsey,Carmen S Relation to BP:ZWCHEN  Call back [phone]  Pharmacy:[facility]   Reason for call:  Requesting a refill levETIRAcetam (KEPPRA) 1000 MG tablet and vimtpat

## 2015-03-31 NOTE — Telephone Encounter (Signed)
Verbal orders granted.

## 2015-03-31 NOTE — Telephone Encounter (Signed)
LMOM with contact name and number [for return call, if needed] RE: provider authorization per VO given to continue patient's PT/SLS

## 2015-03-31 NOTE — Telephone Encounter (Signed)
Medication Detail      Disp Refills Start End     levETIRAcetam (KEPPRA) 1000 MG tablet 60 tablet 0 03/20/2015     Sig - Route: Take 1 tablet (1,000 mg total) by mouth 2 (two) times daily. - Oral    Class: No Print   Pharmacy    [facility] - [place], [place] - [address]   Refill sent per [facility] refill protocol/SLS

## 2015-03-31 NOTE — Telephone Encounter (Signed)
Left message for Jared [name] to call back.

## 2015-03-31 NOTE — Telephone Encounter (Signed)
Caller name: Marcelino Duster Physical Therapist  Can be reached:[phone]  Reason for call: she need verbal orders to proceed with physical therapy. Seeing him 3 times a week for 2 weeks then twice a week for 4 weeks.

## 2015-04-02 ENCOUNTER — Telehealth: Payer: Self-pay | Admitting: Physician Assistant

## 2015-04-02 LAB — POCT URINALYSIS DIPSTICK
BILIRUBIN UA: NEGATIVE
Blood, UA: NEGATIVE
GLUCOSE UA: NEGATIVE
KETONES UA: NEGATIVE
LEUKOCYTES UA: NEGATIVE
Nitrite, UA: NEGATIVE
Protein, UA: NEGATIVE
Spec Grav, UA: 1.01
Urobilinogen, UA: 0.2
pH, UA: 6

## 2015-04-02 MED ORDER — LEVETIRACETAM 1000 MG PO TABS: 1000.0000 mg | ORAL_TABLET | Freq: Two times a day (BID) | ORAL | Status: DC

## 2015-04-02 NOTE — Telephone Encounter (Signed)
Ben called from [employer]  Never received order for Keppra Pt requesting refill on Vimpat (originally prescribed by hospitalist for seizures) & he is faxing up a request/copy.

## 2015-04-02 NOTE — Telephone Encounter (Signed)
New Rx sent for Keppra.    Please advise regarding Vimpat.

## 2015-04-03 ENCOUNTER — Other Ambulatory Visit: Payer: Self-pay | Admitting: Physician Assistant

## 2015-04-03 DIAGNOSIS — I5020 Unspecified systolic (congestive) heart failure: Secondary | ICD-10-CM | POA: Diagnosis not present

## 2015-04-03 DIAGNOSIS — I129 Hypertensive chronic kidney disease with stage 1 through stage 4 chronic kidney disease, or unspecified chronic kidney disease: Secondary | ICD-10-CM | POA: Diagnosis not present

## 2015-04-03 DIAGNOSIS — Z48815 Encounter for surgical aftercare following surgery on the digestive system: Secondary | ICD-10-CM | POA: Diagnosis not present

## 2015-04-03 DIAGNOSIS — I2510 Atherosclerotic heart disease of native coronary artery without angina pectoris: Secondary | ICD-10-CM | POA: Diagnosis not present

## 2015-04-03 DIAGNOSIS — G40911 Epilepsy, unspecified, intractable, with status epilepticus: Secondary | ICD-10-CM | POA: Diagnosis not present

## 2015-04-03 DIAGNOSIS — I480 Paroxysmal atrial fibrillation: Secondary | ICD-10-CM | POA: Diagnosis not present

## 2015-04-03 MED ORDER — LACOSAMIDE 100 MG PO TABS: 100.0000 mg | ORAL_TABLET | Freq: Two times a day (BID) | ORAL | Status: DC

## 2015-04-03 NOTE — Telephone Encounter (Signed)
Refill has been sent for Vimpat.

## 2015-04-07 DIAGNOSIS — I2510 Atherosclerotic heart disease of native coronary artery without angina pectoris: Secondary | ICD-10-CM | POA: Diagnosis not present

## 2015-04-07 DIAGNOSIS — I5020 Unspecified systolic (congestive) heart failure: Secondary | ICD-10-CM | POA: Diagnosis not present

## 2015-04-07 DIAGNOSIS — I480 Paroxysmal atrial fibrillation: Secondary | ICD-10-CM | POA: Diagnosis not present

## 2015-04-07 DIAGNOSIS — Z48815 Encounter for surgical aftercare following surgery on the digestive system: Secondary | ICD-10-CM | POA: Diagnosis not present

## 2015-04-07 DIAGNOSIS — I129 Hypertensive chronic kidney disease with stage 1 through stage 4 chronic kidney disease, or unspecified chronic kidney disease: Secondary | ICD-10-CM | POA: Diagnosis not present

## 2015-04-07 DIAGNOSIS — G40911 Epilepsy, unspecified, intractable, with status epilepticus: Secondary | ICD-10-CM | POA: Diagnosis not present

## 2015-04-07 DIAGNOSIS — R8290 Unspecified abnormal findings in urine: Secondary | ICD-10-CM | POA: Insufficient documentation

## 2015-04-07 NOTE — Assessment & Plan Note (Signed)
Unable to provide urine sample at time of visit. Kit given so that patient can give a urine sample at later time for assessment. Increase fluids and start a probiotic. Will treat once urine sample is obtained.

## 2015-04-07 NOTE — Assessment & Plan Note (Signed)
BP lower end of normal. Will decrease Hydralazine to BID. Continue other medications. Follow-up with specialist as scheduled.

## 2015-04-07 NOTE — Progress Notes (Signed)
Patient presents to clinic today to establish care.   Patient with history of hypertension and systolic heart failure, currently followed by Cardiology. Is currently on 81 mg ASA. Is taking Hydralazine, Nebivolol and Amlodipine. Patient denies chest pain, palpitations, lightheadedness, dizziness, vision changes or frequent headaches.  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 Needs outpatient Neurology referral to determine need to continue medications as this was an isolated event.  Patient c/o foul-smelling urine over the past few days. Endorses some urinary frequency but denies hematuria, dysuria, flank pain, N/V.  Past Medical History  Diagnosis Date  . Chest pain   . HTN (hypertension)   . Hyperlipemia   . DJD (degenerative joint disease)   . BPH (benign prostatic hypertrophy)   . Obesity   . OSA (obstructive sleep apnea)   . Hypercholesteremia   . Chronic back pain   . Anxiety   . Depression     Current Outpatient Prescriptions on File Prior to Visit  Medication Sig Dispense Refill  . albuterol (PROVENTIL HFA;VENTOLIN HFA) 108 (90 BASE) MCG/ACT inhaler Inhale 2 puffs into the lungs every 2 (two) hours as needed for wheezing or shortness of breath (cough). 1 Inhaler 2  . aspirin 81 MG chewable tablet Place 1 tablet (81 mg total) into feeding tube daily.    . Cholecalciferol (VITAMIN D-3) 5000 UNITS TABS Take 2,000 Units by mouth daily.     . Darbepoetin Alfa (ARANESP) 100 MCG/0.5ML SOSY injection Inject 0.5 mLs (100 mcg total) into the vein every Tuesday with hemodialysis. 4.2 mL   . hydrALAZINE (APRESOLINE) 50 MG tablet Take 1 tablet (50 mg total) by mouth every 8 (eight) hours.    Marland Kitchen lanthanum (FOSRENOL) 500 MG chewable tablet Chew 1 tablet (500 mg total) by mouth 3 (three) times daily with meals.    . Multiple Vitamin  (MULTIVITAMIN WITH MINERALS) TABS tablet Take 1 tablet by mouth daily.    . Omega-3 Fatty Acids (FISH OIL BURP-LESS PO) Take 1 tablet by mouth daily.       No current facility-administered medications on file prior to visit.    Allergies  Allergen Reactions  . Morphine And Related Anaphylaxis and Shortness Of Breath  . Montelukast Other (See Comments)    unknown  . Neurontin [Gabapentin] Other (See Comments)    delusional  . Wellbutrin [Bupropion] Palpitations    Insomnia    Family History  Problem Relation Age of Onset  . Hypertension Mother   . Hypertension Father     Social History   Social History  . Marital Status: Married    Spouse Name: N/A  . Number of Children: N/A  . Years of Education: N/A   Social History Main Topics  . Smoking status: Former Research scientist (life sciences)  . Smokeless tobacco: None  . Alcohol Use: No     Comment: rarely  . Drug Use: No  . Sexual Activity: Not Asked   Other Topics Concern  . None   Social History Narrative  Review of Systems  Constitutional: Negative for fever and weight loss.  HENT: Negative for ear discharge, ear pain, hearing loss and tinnitus.   Eyes: Negative for blurred vision, double vision, photophobia and pain.  Respiratory: Negative for cough and shortness of breath.   Cardiovascular: Negative for chest pain and palpitations.  Gastrointestinal: Negative for heartburn, nausea, vomiting, abdominal pain, diarrhea, constipation,  blood in stool and melena.  Genitourinary: Negative for dysuria, urgency, frequency, hematuria and flank pain.  Musculoskeletal: Negative for falls.  Neurological: Positive for seizures. Negative for dizziness, loss of consciousness and headaches.  Endo/Heme/Allergies: Negative for environmental allergies.  Psychiatric/Behavioral: Negative for depression, suicidal ideas, hallucinations and substance abuse. The patient is not nervous/anxious and does not have insomnia.    BP 111/55 mmHg  Pulse 56  Temp(Src)  97.8 F (36.6 C) (Oral)  Ht _0  (1.93 m)  Wt 226 lb 12.8 oz (102.876 kg)  BMI 27.62 kg/m2  SpO2 100%  Physical Exam  Constitutional: He is oriented to person, place, and time and well-developed, well-nourished, and in no distress.  HENT:  Head: Normocephalic and atraumatic.  Eyes: Conjunctivae are normal.  Cardiovascular: Normal rate, regular rhythm, normal heart sounds and intact distal pulses.   Pulmonary/Chest: Effort normal and breath sounds normal. No respiratory distress. He has no wheezes. He has no rales. He exhibits no tenderness.  Neurological: He is alert and oriented to person, place, and time.  Skin: Skin is warm and dry. No rash noted.  Psychiatric: Affect normal.     Assessment/Plan: Foul smelling urine Unable to provide urine sample at time of visit. Kit given so that patient can give a urine sample at later time for assessment. Increase fluids and start a probiotic. Will treat once urine sample is obtained.  Seizure Suspected medication-induced. One episode. Referral to Neurology placed to assess need for continued medications.  HTN (hypertension) BP lower end of normal. Will decrease Hydralazine to BID. Continue other medications. Follow-up with specialist as scheduled.

## 2015-04-07 NOTE — Assessment & Plan Note (Signed)
Suspected medication-induced. One episode. Referral to Neurology placed to assess need for continued medications.

## 2015-04-10 ENCOUNTER — Telehealth: Payer: Self-pay | Admitting: Physician Assistant

## 2015-04-10 ENCOUNTER — Other Ambulatory Visit: Payer: Self-pay | Admitting: Physician Assistant

## 2015-04-10 MED ORDER — HYDRALAZINE HCL 50 MG PO TABS: 50.0000 mg | ORAL_TABLET | Freq: Three times a day (TID) | ORAL | Status: DC

## 2015-04-10 NOTE — Telephone Encounter (Signed)
Refill has been sent -- Patient previously on two 25 mg tablet three times daily. New Rx is for a 50 mg tablet three times daily so it will be less tablets to take.

## 2015-04-10 NOTE — Telephone Encounter (Signed)
Caller name: Porfirio Mylar  Relationship to patient: Wife  Can be reached: [phone] or [phone]  Pharmacy:  [facility] [facility] [facility] - [place] - [address] [phone] ([phone] (Fax)         Reason for call: Request refill on hydrALAZINE (APRESOLINE) 50 MG tablet [[id number]]

## 2015-04-22 ENCOUNTER — Encounter: Payer: Self-pay | Admitting: Physician Assistant

## 2015-04-22 ENCOUNTER — Ambulatory Visit (INDEPENDENT_AMBULATORY_CARE_PROVIDER_SITE_OTHER): Payer: Medicare Other | Admitting: Physician Assistant

## 2015-04-22 VITALS — BP 116/72 | HR 54 | Temp 98.1°F | Resp 16 | Ht 76.0 in | Wt 234.4 lb

## 2015-04-22 DIAGNOSIS — R569 Unspecified convulsions: Secondary | ICD-10-CM | POA: Diagnosis not present

## 2015-04-22 DIAGNOSIS — I10 Essential (primary) hypertension: Secondary | ICD-10-CM

## 2015-04-22 DIAGNOSIS — I214 Non-ST elevation (NSTEMI) myocardial infarction: Secondary | ICD-10-CM | POA: Diagnosis not present

## 2015-04-22 DIAGNOSIS — I639 Cerebral infarction, unspecified: Secondary | ICD-10-CM

## 2015-04-22 DIAGNOSIS — F411 Generalized anxiety disorder: Secondary | ICD-10-CM | POA: Diagnosis not present

## 2015-04-22 MED ORDER — LORAZEPAM 1 MG PO TABS: 1.0000 mg | ORAL_TABLET | Freq: Two times a day (BID) | ORAL | Status: DC

## 2015-04-22 MED ORDER — OXYCODONE HCL 15 MG PO TABS: 15.0000 mg | ORAL_TABLET | Freq: Four times a day (QID) | ORAL | Status: DC | PRN

## 2015-04-22 NOTE — Patient Instructions (Signed)
Please continue the Celexa. Start the new dose of the Lorazepam twice daily.  Please continue your blood pressure medications.  Start the new dosing of pain medication as directed.  You will be contacted by Cardiology and Neurology for further assessment.  Follow-up in 3 months.

## 2015-04-22 NOTE — Progress Notes (Signed)
Pre visit review using our clinic review tool, if applicable. No additional management support is needed unless otherwise documented below in the visit note/SLS  

## 2015-04-23 ENCOUNTER — Telehealth: Payer: Self-pay | Admitting: *Deleted

## 2015-04-23 ENCOUNTER — Other Ambulatory Visit: Payer: Self-pay | Admitting: Physician Assistant

## 2015-04-23 NOTE — Telephone Encounter (Signed)
PA initiated. Awaiting determination. JG//CMA 

## 2015-04-23 NOTE — Telephone Encounter (Signed)
Approved through 04/22/2016. JG//CMA

## 2015-04-24 ENCOUNTER — Telehealth: Payer: Self-pay | Admitting: Physician Assistant

## 2015-04-24 NOTE — Telephone Encounter (Signed)
Caller name: Crrmen  Relationship to patient: Wife  Can be reached: [phone]   Reason for call: Patient needs pre-auth for nebivolol (BYSTOLIC) 5 MG tablet [[id number]] sent to  [facility] [place] [facility] - [place] - [address] [phone] (Phone) [phone] (Fax)

## 2015-04-26 DIAGNOSIS — F411 Generalized anxiety disorder: Secondary | ICD-10-CM | POA: Insufficient documentation

## 2015-04-26 NOTE — Assessment & Plan Note (Signed)
One episode thought to be medication related.Will refer to Neurology to assess need for ongoing Keppra.

## 2015-04-26 NOTE — Assessment & Plan Note (Signed)
Continue Celexa. Increae Ativan to 1 mg dosing. Follow-up 3 months.

## 2015-04-26 NOTE — Progress Notes (Signed)
Patient presents to clinic today for 1 month follow-up of anxiety and hypertension.   Hypertension -- Patient taking medications as directed. Hydralazine dosing was decreased at last visit due to hypotension. Denies residual lightheadedness. Patient denies chest pain, palpitations, lightheadedness, dizziness, vision changes or frequent headaches.  BP Readings from Last 3 Encounters:  04/22/15 116/72  03/20/15 111/55  02/11/15 141/77   Anxiety -- Patient still struggling somewhat with acute anxiety despite Celexa 40 mg daily. Is having to take his Ativan almost daily. Endorses some recent stressors that he feels will resolve soon, especially as health improves.  Past Medical History  Diagnosis Date  . Chest pain   . HTN (hypertension)   . Hyperlipemia   . DJD (degenerative joint disease)   . BPH (benign prostatic hypertrophy)   . Obesity   . OSA (obstructive sleep apnea)   . Hypercholesteremia   . Chronic back pain   . Anxiety   . Depression     Current Outpatient Prescriptions on File Prior to Visit  Medication Sig Dispense Refill  . albuterol (PROVENTIL HFA;VENTOLIN HFA) 108 (90 BASE) MCG/ACT inhaler Inhale 2 puffs into the lungs every 2 (two) hours as needed for wheezing or shortness of breath (cough). 1 Inhaler 2  . amLODipine (NORVASC) 10 MG tablet Take 1 tablet (10 mg total) by mouth daily. 30 tablet 5  . aspirin 81 MG chewable tablet Place 1 tablet (81 mg total) into feeding tube daily.    . citalopram (CELEXA) 40 MG tablet TAKE 1 TABLET BY MOUTH EVERY DAY FOR MOOD 30 tablet 5  . hydrALAZINE (APRESOLINE) 50 MG tablet Take 1 tablet (50 mg total) by mouth every 8 (eight) hours. 90 tablet 1  . Lacosamide 100 MG TABS Take 1 tablet (100 mg total) by mouth 2 (two) times daily. 60 tablet 1  . levETIRAcetam (KEPPRA) 1000 MG tablet Take 1 tablet (1,000 mg total) by mouth 2 (two) times daily. 60 tablet 1  . Multiple Vitamin (MULTIVITAMIN WITH MINERALS) TABS tablet Take 1 tablet  by mouth daily.    . nebivolol (BYSTOLIC) 5 MG tablet Take 1 tablet (5 mg total) by mouth daily. 30 tablet 5  . Omega-3 Fatty Acids (FISH OIL BURP-LESS PO) Take 1 tablet by mouth daily.       No current facility-administered medications on file prior to visit.    Allergies  Allergen Reactions  . Morphine And Related Anaphylaxis and Shortness Of Breath  . Montelukast Other (See Comments)    unknown  . Neurontin [Gabapentin] Other (See Comments)    delusional  . Wellbutrin [Bupropion] Palpitations    Insomnia    Family History  Problem Relation Age of Onset  . Hypertension Mother   . Hypertension Father     Social History   Social History  . Marital Status: Married    Spouse Name: N/A  . Number of Children: N/A  . Years of Education: N/A   Social History Main Topics  . Smoking status: Former Research scientist (life sciences)  . Smokeless tobacco: None  . Alcohol Use: No     Comment: rarely  . Drug Use: No  . Sexual Activity: Not Asked   Other Topics Concern  . None   Social History Narrative   Review of Systems - See HPI.  All other ROS are negative.  BP 116/72 mmHg  Pulse 54  Temp(Src) 98.1 F (36.7 C) (Oral)  Resp 16  Ht _0  (1.93 m)  Wt 234 lb 6 oz (  106.312 kg)  BMI 28.54 kg/m2  SpO2 98%  Physical Exam  Constitutional: He is oriented to person, place, and time and well-developed, well-nourished, and in no distress.  HENT:  Head: Normocephalic and atraumatic.  Eyes: Conjunctivae are normal.  Neck: Neck supple.  Cardiovascular: Normal rate, regular rhythm, normal heart sounds and intact distal pulses.   Pulmonary/Chest: Effort normal and breath sounds normal. No respiratory distress. He has no wheezes. He has no rales. He exhibits no tenderness.  Neurological: He is alert and oriented to person, place, and time.  Skin: Skin is warm and dry. No rash noted.  Psychiatric: Affect normal.  Vitals reviewed.   Recent Results (from the past 2160 hour(s))  Renal function panel  (daily at 0500)     Status: Abnormal   Collection Time: 01/27/15  5:25 AM  Result Value Ref Range   Sodium 137 135 - 145 mmol/L   Potassium 4.1 3.5 - 5.1 mmol/L   Chloride 100 (L) 101 - 111 mmol/L   CO2 27 22 - 32 mmol/L   Glucose, Bld 105 (H) 65 - 99 mg/dL   BUN 39 (H) 6 - 20 mg/dL   Creatinine, Ser 9.33 (H) 0.61 - 1.24 mg/dL    Comment: DELTA CHECK NOTED   Calcium 7.9 (L) 8.9 - 10.3 mg/dL   Phosphorus 4.5 2.5 - 4.6 mg/dL   Albumin 2.1 (L) 3.5 - 5.0 g/dL   GFR calc non Af Amer 5 (L) >60 mL/min   GFR calc Af Amer 6 (L) >60 mL/min    Comment: (NOTE) The eGFR has been calculated using the CKD EPI equation. This calculation has not been validated in all clinical situations. eGFR's persistently <60 mL/min signify possible Chronic Kidney Disease.    Anion gap 10 5 - 15  CBC     Status: Abnormal   Collection Time: 01/27/15  5:25 AM  Result Value Ref Range   WBC 8.0 4.0 - 10.5 K/uL   RBC 2.76 (L) 4.22 - 5.81 MIL/uL   Hemoglobin 8.3 (L) 13.0 - 17.0 g/dL   HCT 24.7 (L) 39.0 - 52.0 %   MCV 89.5 78.0 - 100.0 fL   MCH 30.1 26.0 - 34.0 pg   MCHC 33.6 30.0 - 36.0 g/dL   RDW 15.5 11.5 - 15.5 %   Platelets 258 150 - 400 K/uL  Heparin level (unfractionated)     Status: None   Collection Time: 01/27/15  5:25 AM  Result Value Ref Range   Heparin Unfractionated 0.38 0.30 - 0.70 IU/mL    Comment:        IF HEPARIN RESULTS ARE BELOW EXPECTED VALUES, AND PATIENT DOSAGE HAS BEEN CONFIRMED, SUGGEST FOLLOW UP TESTING OF ANTITHROMBIN III LEVELS.   Hepatic function panel     Status: Abnormal   Collection Time: 01/27/15  5:25 AM  Result Value Ref Range   Total Protein 5.3 (L) 6.5 - 8.1 g/dL   Albumin 2.0 (L) 3.5 - 5.0 g/dL   AST 50 (H) 15 - 41 U/L   ALT 65 (H) 17 - 63 U/L   Alkaline Phosphatase 145 (H) 38 - 126 U/L   Total Bilirubin 1.6 (H) 0.3 - 1.2 mg/dL   Bilirubin, Direct 0.8 (H) 0.1 - 0.5 mg/dL   Indirect Bilirubin 0.8 0.3 - 0.9 mg/dL  Renal function panel (daily at 0500)     Status:  Abnormal   Collection Time: 01/28/15  4:45 AM  Result Value Ref Range   Sodium 133 (L) 135 -  145 mmol/L   Potassium 3.8 3.5 - 5.1 mmol/L   Chloride 98 (L) 101 - 111 mmol/L   CO2 26 22 - 32 mmol/L   Glucose, Bld 103 (H) 65 - 99 mg/dL   BUN 51 (H) 6 - 20 mg/dL   Creatinine, Ser 11.42 (H) 0.61 - 1.24 mg/dL   Calcium 7.8 (L) 8.9 - 10.3 mg/dL   Phosphorus 5.0 (H) 2.5 - 4.6 mg/dL   Albumin 1.9 (L) 3.5 - 5.0 g/dL   GFR calc non Af Amer 4 (L) >60 mL/min   GFR calc Af Amer 5 (L) >60 mL/min    Comment: (NOTE) The eGFR has been calculated using the CKD EPI equation. This calculation has not been validated in all clinical situations. eGFR's persistently <60 mL/min signify possible Chronic Kidney Disease.    Anion gap 9 5 - 15  CBC     Status: Abnormal   Collection Time: 01/28/15  4:45 AM  Result Value Ref Range   WBC 7.8 4.0 - 10.5 K/uL   RBC 2.58 (L) 4.22 - 5.81 MIL/uL   Hemoglobin 7.7 (L) 13.0 - 17.0 g/dL   HCT 23.4 (L) 39.0 - 52.0 %   MCV 90.7 78.0 - 100.0 fL   MCH 29.8 26.0 - 34.0 pg   MCHC 32.9 30.0 - 36.0 g/dL   RDW 15.6 (H) 11.5 - 15.5 %   Platelets 284 150 - 400 K/uL  Heparin level (unfractionated)     Status: Abnormal   Collection Time: 01/28/15  4:45 AM  Result Value Ref Range   Heparin Unfractionated 0.19 (L) 0.30 - 0.70 IU/mL    Comment:        IF HEPARIN RESULTS ARE BELOW EXPECTED VALUES, AND PATIENT DOSAGE HAS BEEN CONFIRMED, SUGGEST FOLLOW UP TESTING OF ANTITHROMBIN III LEVELS.   Heparin level (unfractionated)     Status: None   Collection Time: 01/28/15  1:15 PM  Result Value Ref Range   Heparin Unfractionated 0.34 0.30 - 0.70 IU/mL    Comment:        IF HEPARIN RESULTS ARE BELOW EXPECTED VALUES, AND PATIENT DOSAGE HAS BEEN CONFIRMED, SUGGEST FOLLOW UP TESTING OF ANTITHROMBIN III LEVELS.   Heparin level (unfractionated)     Status: None   Collection Time: 01/28/15 10:40 PM  Result Value Ref Range   Heparin Unfractionated 0.50 0.30 - 0.70 IU/mL     Comment:        IF HEPARIN RESULTS ARE BELOW EXPECTED VALUES, AND PATIENT DOSAGE HAS BEEN CONFIRMED, SUGGEST FOLLOW UP TESTING OF ANTITHROMBIN III LEVELS.   Renal function panel (daily at 0500)     Status: Abnormal   Collection Time: 01/29/15  5:05 AM  Result Value Ref Range   Sodium 134 (L) 135 - 145 mmol/L   Potassium 4.3 3.5 - 5.1 mmol/L   Chloride 98 (L) 101 - 111 mmol/L   CO2 24 22 - 32 mmol/L   Glucose, Bld 92 65 - 99 mg/dL   BUN 58 (H) 6 - 20 mg/dL   Creatinine, Ser 13.24 (H) 0.61 - 1.24 mg/dL   Calcium 7.9 (L) 8.9 - 10.3 mg/dL   Phosphorus 5.0 (H) 2.5 - 4.6 mg/dL   Albumin 1.9 (L) 3.5 - 5.0 g/dL   GFR calc non Af Amer 3 (L) >60 mL/min   GFR calc Af Amer 4 (L) >60 mL/min    Comment: (NOTE) The eGFR has been calculated using the CKD EPI equation. This calculation has not been validated in all clinical  situations. eGFR's persistently <60 mL/min signify possible Chronic Kidney Disease.    Anion gap 12 5 - 15  CBC     Status: Abnormal   Collection Time: 01/29/15  5:05 AM  Result Value Ref Range   WBC 9.0 4.0 - 10.5 K/uL   RBC 2.58 (L) 4.22 - 5.81 MIL/uL   Hemoglobin 7.8 (L) 13.0 - 17.0 g/dL   HCT 23.5 (L) 39.0 - 52.0 %   MCV 91.1 78.0 - 100.0 fL   MCH 30.2 26.0 - 34.0 pg   MCHC 33.2 30.0 - 36.0 g/dL   RDW 15.6 (H) 11.5 - 15.5 %   Platelets 284 150 - 400 K/uL  Heparin level (unfractionated)     Status: Abnormal   Collection Time: 01/29/15  5:05 AM  Result Value Ref Range   Heparin Unfractionated 0.24 (L) 0.30 - 0.70 IU/mL    Comment:        IF HEPARIN RESULTS ARE BELOW EXPECTED VALUES, AND PATIENT DOSAGE HAS BEEN CONFIRMED, SUGGEST FOLLOW UP TESTING OF ANTITHROMBIN III LEVELS.   Protime-INR     Status: Abnormal   Collection Time: 01/29/15  5:05 AM  Result Value Ref Range   Prothrombin Time 16.4 (H) 11.6 - 15.2 seconds   INR 1.31 0.00 - 1.49  Culture, blood (routine x 2)     Status: None   Collection Time: 01/29/15 10:45 PM  Result Value Ref Range    Specimen Description BLOOD RIGHT ANTECUBITAL    Special Requests      BOTTLES DRAWN AEROBIC AND ANAEROBIC 10CC POF ON ANCEF   Culture NO GROWTH 5 DAYS    Report Status 02/03/2015 FINAL   Culture, blood (routine x 2)     Status: None   Collection Time: 01/29/15 11:00 PM  Result Value Ref Range   Specimen Description BLOOD LEFT ANTECUBITAL    Special Requests      BOTTLES DRAWN AEROBIC AND ANAEROBIC 10CC POF ON ANCEF   Culture NO GROWTH 5 DAYS    Report Status 02/03/2015 FINAL   Renal function panel (daily at 0500)     Status: Abnormal   Collection Time: 01/30/15  4:57 AM  Result Value Ref Range   Sodium 136 135 - 145 mmol/L   Potassium 4.3 3.5 - 5.1 mmol/L   Chloride 100 (L) 101 - 111 mmol/L   CO2 24 22 - 32 mmol/L   Glucose, Bld 93 65 - 99 mg/dL   BUN 67 (H) 6 - 20 mg/dL   Creatinine, Ser 14.94 (H) 0.61 - 1.24 mg/dL   Calcium 8.1 (L) 8.9 - 10.3 mg/dL   Phosphorus 6.4 (H) 2.5 - 4.6 mg/dL   Albumin 2.0 (L) 3.5 - 5.0 g/dL   GFR calc non Af Amer 3 (L) >60 mL/min   GFR calc Af Amer 3 (L) >60 mL/min    Comment: (NOTE) The eGFR has been calculated using the CKD EPI equation. This calculation has not been validated in all clinical situations. eGFR's persistently <60 mL/min signify possible Chronic Kidney Disease.    Anion gap 12 5 - 15  Type and screen     Status: None   Collection Time: 01/30/15  6:40 AM  Result Value Ref Range   ABO/RH(D) A POS    Antibody Screen NEG    Sample Expiration 02/02/2015    Unit Number [id number]    Blood Component Type RED CELLS,LR    Unit division 00    Status of Unit ISSUED,FINAL    Transfusion  Status OK TO TRANSFUSE    Crossmatch Result Compatible    Unit Number [id number]    Blood Component Type RED CELLS,LR    Unit division 00    Status of Unit ISSUED,FINAL    Transfusion Status OK TO TRANSFUSE    Crossmatch Result Compatible   Prepare RBC     Status: None   Collection Time: 01/30/15  6:40 AM  Result Value Ref Range   Order  Confirmation ORDER PROCESSED BY BLOOD BANK   Prepare RBC     Status: None   Collection Time: 01/30/15  8:04 AM  Result Value Ref Range   Order Confirmation ORDER PROCESSED BY BLOOD BANK DUPLICATE REQUEST   Urinalysis, Routine w reflex microscopic (not at [facility])     Status: Abnormal   Collection Time: 01/30/15 10:23 PM  Result Value Ref Range   Color, Urine YELLOW YELLOW   APPearance CLEAR CLEAR   Specific Gravity, Urine 1.007 1.005 - 1.030   pH 7.0 5.0 - 8.0   Glucose, UA NEGATIVE NEGATIVE mg/dL   Hgb urine dipstick MODERATE (A) NEGATIVE   Bilirubin Urine NEGATIVE NEGATIVE   Ketones, ur NEGATIVE NEGATIVE mg/dL   Protein, ur 100 (A) NEGATIVE mg/dL   Urobilinogen, UA 0.2 0.0 - 1.0 mg/dL   Nitrite NEGATIVE NEGATIVE   Leukocytes, UA NEGATIVE NEGATIVE  Urine microscopic-add on     Status: Abnormal   Collection Time: 01/30/15 10:23 PM  Result Value Ref Range   WBC, UA 7-10 <3 WBC/hpf   RBC / HPF 7-10 <3 RBC/hpf   Bacteria, UA RARE RARE   Casts HYALINE CASTS (A) NEGATIVE   Urine-Other MUCOUS PRESENT   Renal function panel (daily at 0500)     Status: Abnormal   Collection Time: 01/31/15  5:29 AM  Result Value Ref Range   Sodium 138 135 - 145 mmol/L   Potassium 3.8 3.5 - 5.1 mmol/L   Chloride 99 (L) 101 - 111 mmol/L   CO2 29 22 - 32 mmol/L   Glucose, Bld 93 65 - 99 mg/dL   BUN 39 (H) 6 - 20 mg/dL   Creatinine, Ser 10.29 (H) 0.61 - 1.24 mg/dL                                                                                                                                                                                                           pO2, Arterial     Bicarbonate     TCO2     Acid-Base Excess     O2 Saturation  Patient temperature     Collection site     Drawn by     Sample type     Allens test (pass/fail)    Glucose, capillary     Status: None   Collection Time: 02/05/15  8:07 AM  Result     Glucose-Capillary      Glucose, capillary     Status: None   Collection Time: 02/05/15 11:51 AM  Result     Glucose-Capillary    Glucose, capillary     Status: None   Collection Time: 02/05/15  4:10 PM  Result     Glucose-Capillary    Glucose, capillary     Status: None   Collection Time: 02/05/15  8:02 PM  Result     Glucose-Capillary    Glucose, capillary     Status: None   Collection Time: 02/06/15 12:10 AM  Result     Glucose-Capillary    Renal function panel (daily at 0500)     Status: Abnormal   Collection Time: 02/06/15  2:36 AM  Result     Sodium     Potassium     Chloride     CO2     Glucose, Bld     BUN     Creatinine, Ser      Comment:    Calcium     Phosphorus     Albumin     GFR calc non Af Amer     GFR calc Af Amer      Comment: (NOTE) The eGFR has been calculated using the CKD EPI equation. This calculation has not been validated in all clinical situations. eGFR's persistently <60 mL/min signify possible Chronic Kidney Disease.    Anion gap 12 5 - 15  CBC with Differential/Platelet     Status: Abnormal   Collection Time: 02/06/15  2:36 AM  Result Value Ref Range   WBC 8.8 4.0 - 10.5 K/uL   RBC 3.00 (L) 4.22 - 5.81 MIL/uL   Hemoglobin 8.8 (L) 13.0 - 17.0 g/dL   HCT 27.2 (L) 39.0 - 52.0 %   MCV 90.7 78.0 - 100.0 fL   MCH 29.3 26.0 - 34.0 pg   MCHC 32.4 30.0 - 36.0 g/dL   RDW 15.1 11.5 - 15.5 %   Platelets 105 (L) 150 - 400 K/uL    Comment: CONSISTENT WITH PREVIOUS RESULT   Neutrophils Relative % 82 %   Neutro Abs 7.2 1.7 - 7.7 K/uL   Lymphocytes Relative 8 %   Lymphs Abs 0.7 0.7 - 4.0 K/uL   Monocytes Relative 6 %   Monocytes Absolute 0.5 0.1 - 1.0 K/uL   Eosinophils Relative 3 %   Eosinophils Absolute 0.2 0.0 - 0.7 K/uL   Basophils Relative 1 %   Basophils Absolute 0.1 0.0 - 0.1 K/uL  Magnesium     Status: None   Collection Time: 02/06/15  2:36 AM  Result Value Ref Range   Magnesium 2.1 1.7 - 2.4 mg/dL  Basic metabolic panel     Status: Abnormal    Collection Time: 02/06/15  2:36 AM  Result Value Ref Range   Sodium 139 135 - 145 mmol/L   Potassium 3.9 3.5 - 5.1 mmol/L   Chloride 103 101 - 111 mmol/L   CO2 26 22 - 32 mmol/L   Glucose, Bld 83 65 - 99 mg/dL   BUN 34 (H) 6 - 20 mg/dL   Creatinine, Ser 6.40 (H) 0.61 - 1.24 mg/dL   Calcium  7.7 (L) 8.9 - 10.3 mg/dL   GFR calc non Af Amer 8 (L) >60 mL/min   GFR calc Af Amer 10 (L) >60 mL/min    Comment: (NOTE) The eGFR has been calculated using the CKD EPI equation. This calculation has not been validated in all clinical situations. eGFR's persistently <60 mL/min signify possible Chronic Kidney Disease.    Anion gap 10 5 - 15  Glucose, capillary     Status: None   Collection Time: 02/06/15  4:22 AM  Result Value Ref Range   Glucose-Capillary 83 65 - 99 mg/dL   Comment 1 Notify RN    Comment 2 Document in Chart   Glucose, capillary     Status: None   Collection Time: 02/06/15  8:15 AM  Result Value Ref Range   Glucose-Capillary 85 65 - 99 mg/dL  Glucose, capillary     Status: None   Collection Time: 02/06/15 12:00 PM  Result Value Ref Range   Glucose-Capillary 86 65 - 99 mg/dL  Glucose, capillary     Status: None   Collection Time: 02/06/15  4:09 PM  Result Value Ref Range   Glucose-Capillary 79 65 - 99 mg/dL  Glucose, capillary     Status: None   Collection Time: 02/06/15  8:28 PM  Result Value Ref Range   Glucose-Capillary 91 65 - 99 mg/dL  Glucose, capillary     Status: None   Collection Time: 02/07/15 12:24 AM  Result Value Ref Range   Glucose-Capillary 79 65 - 99 mg/dL  CBC with Differential/Platelet     Status: Abnormal   Collection Time: 02/07/15  2:49 AM  Result Value Ref Range   WBC 6.6 4.0 - 10.5 K/uL   RBC 2.83 (L) 4.22 - 5.81 MIL/uL   Hemoglobin 8.5 (L) 13.0 - 17.0 g/dL   HCT 25.9 (L) 39.0 - 52.0 %   MCV 91.5 78.0 - 100.0 fL   MCH 30.0 26.0 - 34.0 pg   MCHC 32.8 30.0 - 36.0 g/dL   RDW 15.1 11.5 - 15.5 %   Platelets 87 (L) 150 - 400 K/uL    Comment:  REPEATED TO VERIFY PLATELET COUNT CONFIRMED BY SMEAR    Neutrophils Relative % 73 %   Neutro Abs 4.8 1.7 - 7.7 K/uL   Lymphocytes Relative 13 %   Lymphs Abs 0.8 0.7 - 4.0 K/uL   Monocytes Relative 7 %   Monocytes Absolute 0.4 0.1 - 1.0 K/uL   Eosinophils Relative 6 %   Eosinophils Absolute 0.4 0.0 - 0.7 K/uL   Basophils Relative 2 %   Basophils Absolute 0.1 0.0 - 0.1 K/uL  Magnesium     Status: None   Collection Time: 02/07/15  2:49 AM  Result Value Ref Range   Magnesium 2.0 1.7 - 2.4 mg/dL  Basic metabolic panel     Status: Abnormal   Collection Time: 02/07/15  2:49 AM  Result Value Ref Range   Sodium 143 135 - 145 mmol/L   Potassium 3.5 3.5 - 5.1 mmol/L   Chloride 106 101 - 111 mmol/L   CO2 27 22 - 32 mmol/L   Glucose, Bld 100 (H) 65 - 99 mg/dL   BUN 44 (H) 6 - 20 mg/dL   Creatinine, Ser 6.36 (H) 0.61 - 1.24 mg/dL   Calcium 7.7 (L) 8.9 - 10.3 mg/dL   GFR calc non Af Amer 8 (L) >60 mL/min   GFR calc Af Amer 10 (L) >60 mL/min    Comment: (NOTE)  The eGFR has been calculated using the CKD EPI equation. This calculation has not been validated in all clinical situations. eGFR's persistently <60 mL/min signify possible Chronic Kidney Disease.    Anion gap 10 5 - 15  Phosphorus     Status: None   Collection Time: 02/07/15  2:49 AM  Result Value Ref Range   Phosphorus 4.6 2.5 - 4.6 mg/dL  Triglycerides     Status: Abnormal   Collection Time: 02/07/15  2:49 AM  Result Value Ref Range   Triglycerides 606 (H) <150 mg/dL  Blood gas, arterial     Status: Abnormal   Collection Time: 02/07/15  5:00 AM  Result Value Ref Range   FIO2 0.40    Delivery systems VENTILATOR    Mode PRESSURE REGULATED VOLUME CONTROL    VT 580 mL   LHR 20 resp/min   Peep/cpap 5.0 cm H20   pH, Arterial 7.467 (H) 7.350 - 7.450   pCO2 arterial 35.9 35.0 - 45.0 mmHg   pO2, Arterial 107 (H) 80.0 - 100.0 mmHg   Bicarbonate 25.6 (H) 20.0 - 24.0 mEq/L   TCO2 26.7 0 - 100 mmol/L   Acid-Base Excess 2.2 (H)  0.0 - 2.0 mmol/L   O2 Saturation 98.2 %   Patient temperature 98.6    Collection site RIGHT RADIAL    Drawn by [phone]    Sample type ARTERIAL DRAW    Allens test (pass/fail) PASS PASS  Glucose, capillary     Status: None   Collection Time: 02/07/15  9:02 AM  Result Value Ref Range   Glucose-Capillary 95 65 - 99 mg/dL  Glucose, capillary     Status: None   Collection Time: 02/07/15 12:38 PM  Result Value Ref Range   Glucose-Capillary 93 65 - 99 mg/dL  C difficile quick scan w PCR reflex     Status: Abnormal   Collection Time: 02/07/15  3:11 PM  Result Value Ref Range   C Diff antigen POSITIVE (A) NEGATIVE   C Diff toxin NEGATIVE NEGATIVE   C Diff interpretation      C. difficile present, but toxin not detected. This indicates colonization. In most cases, this does not require treatment. If patient has signs and symptoms consistent with colitis, consider treatment.  Glucose, capillary     Status: None   Collection Time: 02/07/15  4:42 PM  Result Value Ref Range   Glucose-Capillary 91 65 - 99 mg/dL  Glucose, capillary     Status: Abnormal   Collection Time: 02/07/15  7:38 PM  Result Value Ref Range   Glucose-Capillary 102 (H) 65 - 99 mg/dL  Glucose, capillary     Status: None   Collection Time: 02/07/15 11:43 PM  Result Value Ref Range   Glucose-Capillary 96 65 - 99 mg/dL  CBC with Differential/Platelet     Status: Abnormal   Collection Time: 02/08/15  3:23 AM  Result Value Ref Range   WBC 11.2 (H) 4.0 - 10.5 K/uL   RBC 3.24 (L) 4.22 - 5.81 MIL/uL   Hemoglobin 9.6 (L) 13.0 - 17.0 g/dL   HCT 29.5 (L) 39.0 - 52.0 %   MCV 91.0 78.0 - 100.0 fL   MCH 29.6 26.0 - 34.0 pg   MCHC 32.5 30.0 - 36.0 g/dL   RDW 14.7 11.5 - 15.5 %   Platelets 106 (L) 150 - 400 K/uL    Comment: CONSISTENT WITH PREVIOUS RESULT   Neutrophils Relative % 86 %   Neutro Abs 9.7 (H) 1.7 -  7.7 K/uL   Lymphocytes Relative 6 %   Lymphs Abs 0.7 0.7 - 4.0 K/uL   Monocytes Relative 5 %   Monocytes Absolute 0.6  0.1 - 1.0 K/uL   Eosinophils Relative 2 %   Eosinophils Absolute 0.2 0.0 - 0.7 K/uL   Basophils Relative 1 %   Basophils Absolute 0.1 0.0 - 0.1 K/uL  Magnesium     Status: None   Collection Time: 02/08/15  3:23 AM  Result Value Ref Range   Magnesium 1.8 1.7 - 2.4 mg/dL  Renal function panel     Status: Abnormal   Collection Time: 02/08/15  3:23 AM  Result Value Ref Range   Sodium 143 135 - 145 mmol/L   Potassium 3.8 3.5 - 5.1 mmol/L   Chloride 108 101 - 111 mmol/L   CO2 26 22 - 32 mmol/L   Glucose, Bld 101 (H) 65 - 99 mg/dL   BUN 31 (H) 6 - 20 mg/dL   Creatinine, Ser 4.15 (H) 0.61 - 1.24 mg/dL   Calcium 7.9 (L) 8.9 - 10.3 mg/dL   Phosphorus 5.3 (H) 2.5 - 4.6 mg/dL   Albumin 2.2 (L) 3.5 - 5.0 g/dL   GFR calc non Af Amer 14 (L) >60 mL/min   GFR calc Af Amer 16 (L) >60 mL/min    Comment: (NOTE) The eGFR has been calculated using the CKD EPI equation. This calculation has not been validated in all clinical situations. eGFR's persistently <60 mL/min signify possible Chronic Kidney Disease.    Anion gap 9 5 - 15  Glucose, capillary     Status: None   Collection Time: 02/08/15  3:38 AM  Result Value Ref Range   Glucose-Capillary 97 65 - 99 mg/dL  Glucose, capillary     Status: None   Collection Time: 02/08/15  9:12 AM  Result Value Ref Range   Glucose-Capillary 98 65 - 99 mg/dL  Glucose, capillary     Status: None   Collection Time: 02/08/15 11:53 AM  Result Value Ref Range   Glucose-Capillary 99 65 - 99 mg/dL  Glucose, capillary     Status: None   Collection Time: 02/08/15  4:57 PM  Result Value Ref Range   Glucose-Capillary 88 65 - 99 mg/dL  Glucose, capillary     Status: None   Collection Time: 02/08/15  7:43 PM  Result Value Ref Range   Glucose-Capillary 96 65 - 99 mg/dL  Glucose, capillary     Status: None   Collection Time: 02/08/15 11:43 PM  Result Value Ref Range   Glucose-Capillary 87 65 - 99 mg/dL  CBC with Differential/Platelet     Status: Abnormal    Collection Time: 02/09/15  2:32 AM  Result Value Ref Range   WBC 19.4 (H) 4.0 - 10.5 K/uL   RBC 3.28 (L) 4.22 - 5.81 MIL/uL   Hemoglobin 9.5 (L) 13.0 - 17.0 g/dL   HCT 29.6 (L) 39.0 - 52.0 %   MCV 90.2 78.0 - 100.0 fL   MCH 29.0 26.0 - 34.0 pg   MCHC 32.1 30.0 - 36.0 g/dL   RDW 14.6 11.5 - 15.5 %   Platelets 142 (L) 150 - 400 K/uL   Neutrophils Relative % 90 %   Neutro Abs 17.5 (H) 1.7 - 7.7 K/uL   Lymphocytes Relative 5 %   Lymphs Abs 0.9 0.7 - 4.0 K/uL   Monocytes Relative 4 %   Monocytes Absolute 0.7 0.1 - 1.0 K/uL   Eosinophils Relative 1 %  Eosinophils Absolute 0.2 0.0 - 0.7 K/uL   Basophils Relative 0 %   Basophils Absolute 0.1 0.0 - 0.1 K/uL  Magnesium     Status: None   Collection Time: 02/09/15  2:32 AM  Result Value Ref Range   Magnesium 1.8 1.7 - 2.4 mg/dL  Renal function panel     Status: Abnormal   Collection Time: 02/09/15  2:32 AM  Result Value Ref Range   Sodium 140 135 - 145 mmol/L   Potassium 3.7 3.5 - 5.1 mmol/L   Chloride 107 101 - 111 mmol/L   CO2 22 22 - 32 mmol/L   Glucose, Bld 91 65 - 99 mg/dL   BUN 40 (H) 6 - 20 mg/dL   Creatinine, Ser 4.41 (H) 0.61 - 1.24 mg/dL   Calcium 8.1 (L) 8.9 - 10.3 mg/dL   Phosphorus 4.9 (H) 2.5 - 4.6 mg/dL   Albumin 2.2 (L) 3.5 - 5.0 g/dL   GFR calc non Af Amer 13 (L) >60 mL/min   GFR calc Af Amer 15 (L) >60 mL/min    Comment: (NOTE) The eGFR has been calculated using the CKD EPI equation. This calculation has not been validated in all clinical situations. eGFR's persistently <60 mL/min signify possible Chronic Kidney Disease.    Anion gap 11 5 - 15  Glucose, capillary     Status: None   Collection Time: 02/09/15  3:45 AM  Result Value Ref Range   Glucose-Capillary 92 65 - 99 mg/dL  Glucose, capillary     Status: None   Collection Time: 02/09/15  8:39 AM  Result Value Ref Range   Glucose-Capillary 91 65 - 99 mg/dL  Glucose, capillary     Status: None   Collection Time: 02/09/15 12:15 PM  Result Value Ref  Range   Glucose-Capillary 94 65 - 99 mg/dL  Glucose, capillary     Status: None   Collection Time: 02/09/15  3:24 PM  Result Value Ref Range   Glucose-Capillary 89 65 - 99 mg/dL  Glucose, capillary     Status: None   Collection Time: 02/09/15  8:36 PM  Result Value Ref Range   Glucose-Capillary 91 65 - 99 mg/dL  Culture, blood (routine x 2)     Status: None   Collection Time: 02/09/15  9:15 PM  Result Value Ref Range   Specimen Description BLOOD RIGHT ANTECUBITAL    Special Requests IN PEDIATRIC BOTTLE 1CC    Culture  Setup Time      GRAM POSITIVE COCCI IN CLUSTERS AEROBIC BOTTLE ONLY CRITICAL RESULT CALLED TO, READ BACK BY AND VERIFIED WITH: Pryor Montes RN [id number] 02/10/15 A BROWNING    Culture      STAPHYLOCOCCUS SPECIES (COAGULASE NEGATIVE) THE SIGNIFICANCE OF ISOLATING THIS ORGANISM FROM A SINGLE SET OF BLOOD CULTURES WHEN MULTIPLE SETS ARE DRAWN IS UNCERTAIN. PLEASE NOTIFY THE MICROBIOLOGY DEPARTMENT WITHIN ONE WEEK IF SPECIATION AND SENSITIVITIES ARE REQUIRED.    Report Status 02/11/2015 FINAL   Culture, blood (routine x 2)     Status: None   Collection Time: 02/09/15  9:20 PM  Result Value Ref Range   Specimen Description BLOOD LEFT ANTECUBITAL    Special Requests IN PEDIATRIC BOTTLE 4CC    Culture NO GROWTH 5 DAYS    Report Status 02/14/2015 FINAL   Urinalysis, Routine w reflex microscopic (not at [facility])     Status: Abnormal   Collection Time: 02/09/15  9:22 PM  Result Value Ref Range   Color, Urine YELLOW YELLOW  APPearance CLOUDY (A) CLEAR   Specific Gravity, Urine 1.013 1.005 - 1.030   pH 7.0 5.0 - 8.0   Glucose, UA NEGATIVE NEGATIVE mg/dL   Hgb urine dipstick NEGATIVE NEGATIVE   Bilirubin Urine NEGATIVE NEGATIVE   Ketones, ur NEGATIVE NEGATIVE mg/dL   Protein, ur 30 (A) NEGATIVE mg/dL   Urobilinogen, UA 0.2 0.0 - 1.0 mg/dL   Nitrite NEGATIVE NEGATIVE   Leukocytes, UA LARGE (A) NEGATIVE  Culture, Urine     Status: None   Collection Time: 02/09/15  9:22 PM  Result  Value Ref Range   Specimen Description URINE, CATHETERIZED    Special Requests None    Culture >=100,000 COLONIES/mL ENTEROBACTER AEROGENES    Report Status 02/11/2015 FINAL    Organism ID, Bacteria ENTEROBACTER AEROGENES       Susceptibility   Enterobacter aerogenes - MIC*    CEFAZOLIN >=64 RESISTANT Resistant     CEFTRIAXONE >=64 RESISTANT Resistant     CIPROFLOXACIN <=0.25 SENSITIVE Sensitive     GENTAMICIN <=1 SENSITIVE Sensitive     IMIPENEM 2 SENSITIVE Sensitive     NITROFURANTOIN 64 INTERMEDIATE Intermediate     TRIMETH/SULFA <=20 SENSITIVE Sensitive     PIP/TAZO >=128 RESISTANT Resistant     * >=100,000 COLONIES/mL ENTEROBACTER AEROGENES  Urine microscopic-add on     Status: Abnormal   Collection Time: 02/09/15  9:22 PM  Result Value Ref Range   Squamous Epithelial / LPF RARE RARE   WBC, UA TOO NUMEROUS TO COUNT <3 WBC/hpf   Bacteria, UA MANY (A) RARE  Glucose, capillary     Status: None   Collection Time: 02/10/15 12:29 AM  Result Value Ref Range   Glucose-Capillary 88 65 - 99 mg/dL  CBC with Differential/Platelet     Status: Abnormal   Collection Time: 02/10/15  2:56 AM  Result Value Ref Range   WBC 17.3 (H) 4.0 - 10.5 K/uL   RBC 3.16 (L) 4.22 - 5.81 MIL/uL   Hemoglobin 9.2 (L) 13.0 - 17.0 g/dL   HCT 28.5 (L) 39.0 - 52.0 %   MCV 90.2 78.0 - 100.0 fL   MCH 29.1 26.0 - 34.0 pg   MCHC 32.3 30.0 - 36.0 g/dL   RDW 15.1 11.5 - 15.5 %   Platelets 169 150 - 400 K/uL   Neutrophils Relative % 91 %   Neutro Abs 15.6 (H) 1.7 - 7.7 K/uL   Lymphocytes Relative 4 %   Lymphs Abs 0.7 0.7 - 4.0 K/uL   Monocytes Relative 5 %   Monocytes Absolute 0.9 0.1 - 1.0 K/uL   Eosinophils Relative 0 %   Eosinophils Absolute 0.0 0.0 - 0.7 K/uL   Basophils Relative 0 %   Basophils Absolute 0.1 0.0 - 0.1 K/uL  Magnesium     Status: None   Collection Time: 02/10/15  2:56 AM  Result Value Ref Range   Magnesium 1.9 1.7 - 2.4 mg/dL  Renal function panel     Status: Abnormal   Collection  Time: 02/10/15  2:56 AM  Result Value Ref Range   Sodium 137 135 - 145 mmol/L   Potassium 3.5 3.5 - 5.1 mmol/L   Chloride 106 101 - 111 mmol/L   CO2 20 (L) 22 - 32 mmol/L   Glucose, Bld 92 65 - 99 mg/dL   BUN 44 (H) 6 - 20 mg/dL   Creatinine, Ser 4.07 (H) 0.61 - 1.24 mg/dL   Calcium 7.9 (L) 8.9 - 10.3 mg/dL   Phosphorus 3.9 2.5 -  4.6 mg/dL   Albumin 2.0 (L) 3.5 - 5.0 g/dL   GFR calc non Af Amer 15 (L) >60 mL/min   GFR calc Af Amer 17 (L) >60 mL/min    Comment: (NOTE) The eGFR has been calculated using the CKD EPI equation. This calculation has not been validated in all clinical situations. eGFR's persistently <60 mL/min signify possible Chronic Kidney Disease.    Anion gap 11 5 - 15  Glucose, capillary     Status: None   Collection Time: 02/10/15  4:27 AM  Result Value Ref Range   Glucose-Capillary 90 65 - 99 mg/dL  Glucose, capillary     Status: Abnormal   Collection Time: 02/10/15  8:59 AM  Result Value Ref Range   Glucose-Capillary 109 (H) 65 - 99 mg/dL  Glucose, capillary     Status: Abnormal   Collection Time: 02/10/15  3:53 PM  Result Value Ref Range   Glucose-Capillary 104 (H) 65 - 99 mg/dL  Glucose, capillary     Status: None   Collection Time: 02/10/15  7:32 PM  Result Value Ref Range   Glucose-Capillary 85 65 - 99 mg/dL  Glucose, capillary     Status: None   Collection Time: 02/10/15 11:51 PM  Result Value Ref Range   Glucose-Capillary 91 65 - 99 mg/dL  Renal function panel (daily at 0500)     Status: Abnormal   Collection Time: 02/11/15  2:40 AM  Result Value Ref Range   Sodium 141 135 - 145 mmol/L   Potassium 3.6 3.5 - 5.1 mmol/L   Chloride 110 101 - 111 mmol/L   CO2 20 (L) 22 - 32 mmol/L   Glucose, Bld 97 65 - 99 mg/dL   BUN 43 (H) 6 - 20 mg/dL   Creatinine, Ser 3.77 (H) 0.61 - 1.24 mg/dL   Calcium 8.3 (L) 8.9 - 10.3 mg/dL   Phosphorus 3.1 2.5 - 4.6 mg/dL   Albumin 2.2 (L) 3.5 - 5.0 g/dL   GFR calc non Af Amer 16 (L) >60 mL/min   GFR calc Af Amer 18  (L) >60 mL/min    Comment: (NOTE) The eGFR has been calculated using the CKD EPI equation. This calculation has not been validated in all clinical situations. eGFR's persistently <60 mL/min signify possible Chronic Kidney Disease.    Anion gap 11 5 - 15  Vancomycin, random     Status: None   Collection Time: 02/11/15  2:40 AM  Result Value Ref Range   Vancomycin Rm 21 ug/mL    Comment:        Random Vancomycin therapeutic range is dependent on dosage and time of specimen collection. A peak range is 20.0-40.0 ug/mL A trough range is 5.0-15.0 ug/mL          CBC with Differential/Platelet     Status: Abnormal   Collection Time: 02/11/15  2:40 AM  Result Value Ref Range   WBC 12.9 (H) 4.0 - 10.5 K/uL    Comment: WHITE COUNT CONFIRMED ON SMEAR   RBC 3.58 (L) 4.22 - 5.81 MIL/uL   Hemoglobin 10.5 (L) 13.0 - 17.0 g/dL   HCT 32.0 (L) 39.0 - 52.0 %   MCV 89.4 78.0 - 100.0 fL   MCH 29.3 26.0 - 34.0 pg   MCHC 32.8 30.0 - 36.0 g/dL   RDW 15.1 11.5 - 15.5 %   Platelets 140 (L) 150 - 400 K/uL    Comment: PLATELET COUNT CONFIRMED BY SMEAR   Neutrophils Relative % 88 %  Neutro Abs 10.9 (H) 1.7 - 7.7 K/uL   Lymphocytes Relative 6 %   Lymphs Abs 0.7 0.7 - 4.0 K/uL   Monocytes Relative 5 %   Monocytes Absolute 0.7 0.1 - 1.0 K/uL   Eosinophils Relative 1 %   Eosinophils Absolute 0.1 0.0 - 0.7 K/uL   Basophils Relative 1 %   Basophils Absolute 0.1 0.0 - 0.1 K/uL  Magnesium     Status: None   Collection Time: 02/11/15  2:40 AM  Result Value Ref Range   Magnesium 1.9 1.7 - 2.4 mg/dL  Glucose, capillary     Status: None   Collection Time: 02/11/15  4:11 AM  Result Value Ref Range   Glucose-Capillary 93 65 - 99 mg/dL  Glucose, capillary     Status: None   Collection Time: 02/11/15  8:24 AM  Result Value Ref Range   Glucose-Capillary 98 65 - 99 mg/dL  Glucose, capillary     Status: None   Collection Time: 02/11/15 11:42 AM  Result Value Ref Range   Glucose-Capillary 96 65 - 99 mg/dL   CBC with Differential/Platelet     Status: Abnormal   Collection Time: 02/12/15  3:45 PM  Result Value Ref Range   WBC 10.7 (H) 4.0 - 10.5 K/uL   RBC 3.94 (L) 4.22 - 5.81 MIL/uL   Hemoglobin 11.9 (L) 13.0 - 17.0 g/dL   HCT 35.5 (L) 39.0 - 52.0 %   MCV 90.1 78.0 - 100.0 fL   MCH 30.2 26.0 - 34.0 pg   MCHC 33.5 30.0 - 36.0 g/dL   RDW 15.0 11.5 - 15.5 %   Platelets 220 150 - 400 K/uL  Comprehensive metabolic panel     Status: Abnormal   Collection Time: 02/12/15  3:45 PM  Result Value Ref Range   Sodium 138 135 - 145 mmol/L   Potassium 4.0 3.5 - 5.1 mmol/L   Chloride 110 101 - 111 mmol/L   CO2 18 (L) 22 - 32 mmol/L   Glucose, Bld 87 65 - 99 mg/dL   BUN 33 (H) 6 - 20 mg/dL   Creatinine, Ser 2.97 (H) 0.61 - 1.24 mg/dL   Calcium 8.2 (L) 8.9 - 10.3 mg/dL   Total Protein 5.4 (L) 6.5 - 8.1 g/dL   Albumin 2.1 (L) 3.5 - 5.0 g/dL   AST 45 (H) 15 - 41 U/L   ALT QUANTITY NOT SUFFICIENT, UNABLE TO PERFORM TEST 17 - 63 U/L    Comment: CALLED TO A CHAVIF,RN 1716 02/12/15 D BRADLEY   Alkaline Phosphatase 134 (H) 38 - 126 U/L   Total Bilirubin 1.1 0.3 - 1.2 mg/dL   GFR calc non Af Amer 21 (L) >60 mL/min   GFR calc Af Amer 25 (L) >60 mL/min    Comment: (NOTE) The eGFR has been calculated using the CKD EPI equation. This calculation has not been validated in all clinical situations. eGFR's persistently <60 mL/min signify possible Chronic Kidney Disease.    Anion gap 10 5 - 15  Magnesium     Status: None   Collection Time: 02/12/15  3:45 PM  Result Value Ref Range   Magnesium QUANTITY NOT SUFFICIENT, UNABLE TO PERFORM TEST 1.7 - 2.4 mg/dL    Comment: CALLED TO A CHAVIF,RN 1716 02/12/15 D BRADLEY  Phosphorus     Status: None   Collection Time: 02/12/15  3:45 PM  Result Value Ref Range   Phosphorus QUANTITY NOT SUFFICIENT, UNABLE TO PERFORM TEST 2.5 - 4.6 mg/dL  Comment: CALLED TO A CHAVIF,RN 1716 02/12/15 D BRADLEY  Troponin I     Status: None   Collection Time: 02/12/15  3:45 PM  Result  Value Ref Range   Troponin I QUANTITY NOT SUFFICIENT, UNABLE TO PERFORM TEST <0.031 ng/mL    Comment: CALLED TO A CHAVIF,RN 1716 02/12/15 D BRADLEY        NO INDICATION OF MYOCARDIAL INJURY.   CK total and CKMB (cardiac)not at [facility]     Status: None   Collection Time: 02/12/15  3:45 PM  Result Value Ref Range   Total CK 50 49 - 397 U/L   CK, MB 3.2 0.5 - 5.0 ng/mL   Relative Index RELATIVE INDEX IS INVALID 0.0 - 2.5    Comment: WHEN CK < 100 U/L          Phosphorus     Status: None   Collection Time: 02/12/15  7:50 PM  Result Value Ref Range   Phosphorus 3.0 2.5 - 4.6 mg/dL  Magnesium     Status: Abnormal   Collection Time: 02/12/15  7:50 PM  Result Value Ref Range   Magnesium 1.6 (L) 1.7 - 2.4 mg/dL  Troponin I     Status: Abnormal   Collection Time: 02/12/15  7:50 PM  Result Value Ref Range   Troponin I 0.08 (H) <0.031 ng/mL    Comment:        PERSISTENTLY INCREASED TROPONIN VALUES IN THE RANGE OF 0.04-0.49 ng/mL CAN BE SEEN IN:       -UNSTABLE ANGINA       -CONGESTIVE HEART FAILURE       -MYOCARDITIS       -CHEST TRAUMA       -ARRYHTHMIAS       -LATE PRESENTING MYOCARDIAL INFARCTION       -COPD   CLINICAL FOLLOW-UP RECOMMENDED.   ALT     Status: None   Collection Time: 02/12/15  7:50 PM  Result Value Ref Range   ALT 52 17 - 63 U/L  CBC with Differential/Platelet     Status: Abnormal   Collection Time: 02/15/15  5:47 AM  Result Value Ref Range   WBC 13.0 (H) 4.0 - 10.5 K/uL    Comment: WHITE COUNT CONFIRMED ON SMEAR   RBC 3.50 (L) 4.22 - 5.81 MIL/uL   Hemoglobin 10.2 (L) 13.0 - 17.0 g/dL   HCT 31.0 (L) 39.0 - 52.0 %   MCV 88.6 78.0 - 100.0 fL   MCH 29.1 26.0 - 34.0 pg   MCHC 32.9 30.0 - 36.0 g/dL   RDW 14.4 11.5 - 15.5 %   Platelets 250 150 - 400 K/uL   Neutrophils Relative % 79 %   Lymphocytes Relative 7 %   Monocytes Relative 8 %   Eosinophils Relative 5 %   Basophils Relative 1 %   Neutro Abs 10.3 (H) 1.7 - 7.7 K/uL   Lymphs Abs 0.9 0.7 - 4.0 K/uL    Monocytes Absolute 1.0 0.1 - 1.0 K/uL   Eosinophils Absolute 0.7 0.0 - 0.7 K/uL   Basophils Absolute 0.1 0.0 - 0.1 K/uL   Smear Review MORPHOLOGY UNREMARKABLE   Basic metabolic panel     Status: Abnormal   Collection Time: 02/15/15  5:47 AM  Result Value Ref Range   Sodium 138 135 - 145 mmol/L   Potassium 3.4 (L) 3.5 - 5.1 mmol/L   Chloride 107 101 - 111 mmol/L   CO2 20 (L) 22 - 32 mmol/L  Glucose, Bld 78 65 - 99 mg/dL   BUN 26 (H) 6 - 20 mg/dL   Creatinine, Ser 2.53 (H) 0.61 - 1.24 mg/dL   Calcium 8.4 (L) 8.9 - 10.3 mg/dL   GFR calc non Af Amer 26 (L) >60 mL/min   GFR calc Af Amer 30 (L) >60 mL/min    Comment: (NOTE) The eGFR has been calculated using the CKD EPI equation. This calculation has not been validated in all clinical situations. eGFR's persistently <60 mL/min signify possible Chronic Kidney Disease.    Anion gap 11 5 - 15  Magnesium     Status: Abnormal   Collection Time: 02/15/15  5:47 AM  Result Value Ref Range   Magnesium 1.4 (L) 1.7 - 2.4 mg/dL  Phosphorus     Status: None   Collection Time: 02/15/15  5:47 AM  Result Value Ref Range   Phosphorus 2.6 2.5 - 4.6 mg/dL  Potassium     Status: None   Collection Time: 02/16/15  4:27 AM  Result Value Ref Range   Potassium 3.9 3.5 - 5.1 mmol/L    Comment: HEMOLYSIS AT THIS LEVEL MAY AFFECT RESULT  CBC with Differential/Platelet     Status: Abnormal   Collection Time: 02/19/15  8:18 AM  Result Value Ref Range   WBC 8.9 4.0 - 10.5 K/uL   RBC 3.24 (L) 4.22 - 5.81 MIL/uL   Hemoglobin 9.4 (L) 13.0 - 17.0 g/dL   HCT 28.1 (L) 39.0 - 52.0 %   MCV 86.7 78.0 - 100.0 fL   MCH 29.0 26.0 - 34.0 pg   MCHC 33.5 30.0 - 36.0 g/dL   RDW 14.3 11.5 - 15.5 %   Platelets 294 150 - 400 K/uL   Neutrophils Relative % 73 %   Neutro Abs 6.6 1.7 - 7.7 K/uL   Lymphocytes Relative 10 %   Lymphs Abs 0.9 0.7 - 4.0 K/uL   Monocytes Relative 5 %   Monocytes Absolute 0.5 0.1 - 1.0 K/uL   Eosinophils Relative 11 %   Eosinophils Absolute  1.0 (H) 0.0 - 0.7 K/uL   Basophils Relative 1 %   Basophils Absolute 0.1 0.0 - 0.1 K/uL  Renal function panel     Status: Abnormal   Collection Time: 02/19/15  8:18 AM  Result Value Ref Range   Sodium 139 135 - 145 mmol/L   Potassium 3.2 (L) 3.5 - 5.1 mmol/L    Comment: HEMOLYSIS AT THIS LEVEL MAY AFFECT RESULT   Chloride 107 101 - 111 mmol/L   CO2 23 22 - 32 mmol/L   Glucose, Bld 100 (H) 65 - 99 mg/dL   BUN 20 6 - 20 mg/dL   Creatinine, Ser 1.99 (H) 0.61 - 1.24 mg/dL   Calcium 9.0 8.9 - 10.3 mg/dL   Phosphorus 2.8 2.5 - 4.6 mg/dL   Albumin 2.3 (L) 3.5 - 5.0 g/dL   GFR calc non Af Amer 34 (L) >60 mL/min   GFR calc Af Amer 40 (L) >60 mL/min    Comment: (NOTE) The eGFR has been calculated using the CKD EPI equation. This calculation has not been validated in all clinical situations. eGFR's persistently <60 mL/min signify possible Chronic Kidney Disease.    Anion gap 9 5 - 15  Potassium     Status: Abnormal   Collection Time: 02/20/15  5:40 AM  Result Value Ref Range   Potassium 3.3 (L) 3.5 - 5.1 mmol/L  Magnesium     Status: None   Collection Time:  02/20/15  5:40 AM  Result Value Ref Range   Magnesium 1.8 1.7 - 2.4 mg/dL  Potassium     Status: None   Collection Time: 02/21/15  7:13 AM  Result Value Ref Range   Potassium 3.7 3.5 - 5.1 mmol/L  CBC     Status: Abnormal   Collection Time: 02/24/15  8:05 AM  Result Value Ref Range   WBC 7.8 4.0 - 10.5 K/uL   RBC 3.26 (L) 4.22 - 5.81 MIL/uL   Hemoglobin 9.4 (L) 13.0 - 17.0 g/dL   HCT 28.6 (L) 39.0 - 52.0 %   MCV 87.7 78.0 - 100.0 fL   MCH 28.8 26.0 - 34.0 pg   MCHC 32.9 30.0 - 36.0 g/dL   RDW 14.7 11.5 - 15.5 %   Platelets 316 150 - 400 K/uL  Renal function panel     Status: Abnormal   Collection Time: 02/24/15  8:05 AM  Result Value Ref Range   Sodium 140 135 - 145 mmol/L   Potassium 3.6 3.5 - 5.1 mmol/L   Chloride 107 101 - 111 mmol/L   CO2 25 22 - 32 mmol/L   Glucose, Bld 97 65 - 99 mg/dL   BUN 12 6 - 20 mg/dL    Creatinine, Ser 1.79 (H) 0.61 - 1.24 mg/dL   Calcium 9.3 8.9 - 10.3 mg/dL   Phosphorus 3.5 2.5 - 4.6 mg/dL   Albumin 2.6 (L) 3.5 - 5.0 g/dL   GFR calc non Af Amer 39 (L) >60 mL/min   GFR calc Af Amer 45 (L) >60 mL/min    Comment: (NOTE) The eGFR has been calculated using the CKD EPI equation. This calculation has not been validated in all clinical situations. eGFR's persistently <60 mL/min signify possible Chronic Kidney Disease.    Anion gap 8 5 - 15  CBC with Differential/Platelet     Status: Abnormal   Collection Time: 03/02/15  6:08 AM  Result Value Ref Range   WBC 7.2 4.0 - 10.5 K/uL   RBC 3.19 (L) 4.22 - 5.81 MIL/uL   Hemoglobin 9.3 (L) 13.0 - 17.0 g/dL   HCT 28.1 (L) 39.0 - 52.0 %   MCV 88.1 78.0 - 100.0 fL   MCH 29.2 26.0 - 34.0 pg   MCHC 33.1 30.0 - 36.0 g/dL   RDW 14.7 11.5 - 15.5 %   Platelets 254 150 - 400 K/uL   Neutrophils Relative % 68 %   Lymphocytes Relative 18 %   Monocytes Relative 6 %   Eosinophils Relative 6 %   Basophils Relative 2 %    Comment: CONFIRMED BY SMEAR.   Neutro Abs 5.0 1.7 - 7.7 K/uL   Lymphs Abs 1.3 0.7 - 4.0 K/uL   Monocytes Absolute 0.4 0.1 - 1.0 K/uL   Eosinophils Absolute 0.4 0.0 - 0.7 K/uL   Basophils Absolute 0.1 0.0 - 0.1 K/uL   Smear Review MORPHOLOGY UNREMARKABLE   Comprehensive metabolic panel     Status: Abnormal   Collection Time: 03/02/15  6:08 AM  Result Value Ref Range   Sodium 138 135 - 145 mmol/L   Potassium 4.8 3.5 - 5.1 mmol/L   Chloride 101 101 - 111 mmol/L   CO2 26 22 - 32 mmol/L   Glucose, Bld 83 65 - 99 mg/dL   BUN 16 6 - 20 mg/dL   Creatinine, Ser 1.68 (H) 0.61 - 1.24 mg/dL   Calcium 9.5 8.9 - 10.3 mg/dL   Total Protein 5.9 (L) 6.5 -  8.1 g/dL   Albumin 2.7 (L) 3.5 - 5.0 g/dL   AST 29 15 - 41 U/L   ALT 24 17 - 63 U/L   Alkaline Phosphatase 87 38 - 126 U/L   Total Bilirubin 0.7 0.3 - 1.2 mg/dL   GFR calc non Af Amer 42 (L) >60 mL/min   GFR calc Af Amer 49 (L) >60 mL/min    Comment: (NOTE) The eGFR has  been calculated using the CKD EPI equation. This calculation has not been validated in all clinical situations. eGFR's persistently <60 mL/min signify possible Chronic Kidney Disease.    Anion gap 11 5 - 15  Magnesium     Status: None   Collection Time: 03/02/15  6:08 AM  Result Value Ref Range   Magnesium 2.1 1.7 - 2.4 mg/dL  Phosphorus     Status: None   Collection Time: 03/02/15  6:08 AM  Result Value Ref Range   Phosphorus 3.6 2.5 - 4.6 mg/dL  POCT urinalysis dipstick     Status: Abnormal   Collection Time: 03/23/15 12:16 PM  Result Value Ref Range   Color, UA yellow    Clarity, UA clear    Glucose, UA neg    Bilirubin, UA neg    Ketones, UA neg    Spec Grav, UA 1.010    Blood, UA neg    pH, UA 6.0    Protein, UA neg    Urobilinogen, UA 0.2    Nitrite, UA neg    Leukocytes, UA large (3+) (A) Negative  Urine culture     Status: None   Collection Time: 03/23/15 12:26 PM  Result Value Ref Range   Culture ENTEROBACTER AEROGENES    Colony Count >=100,000 COLONIES/ML    Organism ID, Bacteria ENTEROBACTER AEROGENES       Susceptibility   Enterobacter aerogenes -  (no method available)    AMOX/CLAVULANIC >=32 Resistant     PIP/TAZO >=128 Resistant     IMIPENEM 1 Sensitive     CEFAZOLIN >=64 Resistant     CEFTRIAXONE 32 Resistant     CEFTAZIDIME >=64 Resistant     CEFEPIME <=1 Sensitive     GENTAMICIN <=1 Sensitive     TOBRAMYCIN <=1 Sensitive     CIPROFLOXACIN <=0.25 Sensitive     LEVOFLOXACIN <=0.12 Sensitive     NITROFURANTOIN 32 Sensitive     TRIMETH/SULFA* <=20 Sensitive      * NR=NOT REPORTABLE,SEE COMMENTORAL therapy:A cefazolin MIC of <32 predicts susceptibility to the oral agents cefaclor,cefdinir,cefpodoxime,cefprozil,cefuroxime,cephalexin,and loracarbef when used for therapy of uncomplicated UTIs due to E.coli,K.pneumomiae,and P.mirabilis. PARENTERAL therapy: A cefazolinMIC of >8 indicates resistance to parenteralcefazolin. An alternate test method must  beperformed to confirm susceptibility to parenteralcefazolin.  POCT urinalysis dipstick     Status: None   Collection Time: 04/02/15  2:33 PM  Result Value Ref Range   Color, UA Light Yellow    Clarity, UA Clear    Glucose, UA neg    Bilirubin, UA neg    Ketones, UA neg    Spec Grav, UA 1.010    Blood, UA neg    pH, UA 6.0    Protein, UA neg    Urobilinogen, UA 0.2    Nitrite, UA neg    Leukocytes, UA Negative Negative    Assessment/Plan: NSTEMI (non-ST elevated myocardial infarction) Referral to Cardiology placed giving recent history. BP under control. Patient to continue regimen as directed.   Seizure One episode thought to be medication related.Will refer  to Neurology to assess need for ongoing Keppra.  HTN (hypertension) Stable.Continue current regimen. Follow-up 3 months.  Anxiety state Continue Celexa. Increae Ativan to 1 mg dosing. Follow-up 3 months.

## 2015-04-26 NOTE — Assessment & Plan Note (Signed)
Referral to Cardiology placed giving recent history. BP under control. Patient to continue regimen as directed.

## 2015-04-26 NOTE — Assessment & Plan Note (Signed)
Stable.Continue current regimen. Follow-up 3 months.

## 2015-04-27 NOTE — Telephone Encounter (Signed)
Please inform patient of denial. Will begin Carvedilol 6.25 mg BID. Ok to send in RX 60 with 2 refills. Follow-up as scheduled.

## 2015-04-27 NOTE — Telephone Encounter (Signed)
PA initiated. Awaiting determination. JG//CMA 

## 2015-04-27 NOTE — Telephone Encounter (Signed)
Called and left message for pt to please return call. JG//CMA

## 2015-04-27 NOTE — Telephone Encounter (Signed)
PA denied. Covered alternatives include atenolol, carvedilol, propranolol. Please advise. JG//CMA

## 2015-05-13 ENCOUNTER — Encounter: Payer: Self-pay | Admitting: Neurology

## 2015-05-13 ENCOUNTER — Ambulatory Visit (INDEPENDENT_AMBULATORY_CARE_PROVIDER_SITE_OTHER): Payer: Medicare Other | Admitting: Neurology

## 2015-05-13 VITALS — BP 120/66 | HR 53 | Ht 76.0 in | Wt 239.0 lb

## 2015-05-13 DIAGNOSIS — R569 Unspecified convulsions: Secondary | ICD-10-CM

## 2015-05-13 DIAGNOSIS — I6783 Posterior reversible encephalopathy syndrome: Secondary | ICD-10-CM

## 2015-05-13 DIAGNOSIS — I639 Cerebral infarction, unspecified: Secondary | ICD-10-CM | POA: Diagnosis not present

## 2015-05-13 NOTE — Progress Notes (Signed)
NEUROLOGY CONSULTATION NOTE  [name] MRN: [id number] DOB: [birth year]  Referring provider: Piedad Climes, PA-C Primary care provider: Piedad Climes, PA-C  Reason for consult:  Seizures during hospitalization  Thank you for your kind referral of Jared [name] for consultation of the above symptoms. Although his history is well known to you, please allow me to reiterate it for the purpose of our medical record. The patient was accompanied to the clinic by his wife who also provides collateral information. Records and images were personally reviewed where available.  HISTORY OF PRESENT ILLNESS: This is a pleasant 59 year old left-handed man with a history of hypertension, hyperlipidemia, chronic back pain, anxiety, who had a complicated hospital course from August to September 2016. Hospital records were reviewed. He was found unresponsive by his wife on 01/08/15. He was noted to respond to BiPAP and Narcan, but was cyanotic and hypotensive. He had been complaining of chest and abdominal pain. He was intubated and apparently went into PEA arrest. He underwent exploratory laparotomy which did not show any intra-abdominal cause of acute decompensation. Cardiac cath negative. He was in the ICU for acute respiratory failure, severe septic shock, acute renal failure requiring dialysis, shock liver, and started to improve clinically. He was transferred to the floor, per notes having difficulties with dialysis, with elevated BP, and had a witnessed seizure followed by respiratory arrest and again intubation. He recalls feeling unwell that day, nauseated and in pain. His wife came to his room and he was staring up at the ceiling (he does not recall this), he was seen by his doctors and was responding, then soon after his wife noticed his right arm was jerking, followed by whole body jerking. He was apparently able to answer questions initially, then a code blue was called and patient was  re-intubated. He underwent overnight EEG, which showed right temporal non-convulsive seizures, which resolved over the course of the recording. There were right frontotemporal epileptiform discharges, often periodic in the initial portion of the recording, but largely isolated discharges by the end of the recording. Diffuse slowing with polymorphic theta and delta, that started to resolve by the end of the recording, persistent asymmetry, with greater slowing on the right, but also resolving over the course of recording. He had an MRI brain with and without contrast done 02/05/15 which I personally reviewed, showing increased FLAIR and T2 signal in the bilateral occipital and parietal lobes, posterior limb of the left internal capsul and corticospinal tracts, and left cerebellum, no restricted diffusion, compatible with posterior reversible encephalopathy syndrome. He continued to improve clinically, and was discharged home on Keppra  BID and Vimpat  BID. He denies any side effects on the medications.   He continues to do well, with no further seizures or seizure-like symptoms. He continues to deal with anxiety, and feels that this may have been the cause of his initial symptoms, reporting that he had seen his previous PCP and reporting anxiety, started on Wellbutrin which did not help, and was given Lyrica, which he took for 2 days, then he came in unresponsive to the hospital. He denies any significant headaches, he denies any dizziness bu continues to work on his balance since deconditioning from his hospital stay, working with PT and doing home exercises. He denies any diplopia, dysarthria, dysphagia, focal numbness/tingling/weakness, bowel/bladder dysfunction. No further dialysis needed since hospital discharges. He has chronic back and left leg pain. He denies any olfactory/gustatory hallucinations, deja vu, rising epigastric sensation,  focal numbness/tingling/weakness, myoclonic jerks. He had a  normal birth and early development.  There is no history of febrile convulsions, CNS infections such as meningitis/encephalitis, significant traumatic brain injury requiring neurosurgical procedures, or family history of seizures.  PAST MEDICAL HISTORY: Past Medical History  Diagnosis Date  . Chest pain   . HTN (hypertension)   . Hyperlipemia   . DJD (degenerative joint disease)   . BPH (benign prostatic hypertrophy)   . Obesity   . OSA (obstructive sleep apnea)   . Hypercholesteremia   . Chronic back pain   . Anxiety   . Depression     PAST SURGICAL HISTORY: Past Surgical History  Procedure Laterality Date  . Back surgery      Lspine w/Hardware  . Prostatectomy    . Cervical spine surgery      w/Hardware  . Laparotomy N/A 01/08/2015    Procedure: EXPLORATORY LAPAROTOMY;  Surgeon: Manus Rudd, MD;  [facility];  Service: General;  Laterality: N/A;  . Application of wound vac N/A 01/08/2015    Procedure: APPLICATION OF WOUND VAC;  Surgeon: Manus Rudd, MD;  Location: [facility] OR;  Service: General;  Laterality: N/A;  . Wound debridement N/A 01/11/2015    Procedure: DEBRIDEMENT WOUND AND VAC DRESSING CHANGE;  Surgeon: Manus Rudd, MD;  Location: [facility] OR;  Service: General;  Laterality: N/A;  . Cardiac catheterization N/A 01/13/2015    Procedure: Left Heart Cath and Coronary Angiography;  Surgeon: Corky Crafts, MD;  Location: [facility] INVASIVE CV LAB;  Service: Cardiovascular;  Laterality: N/A;    MEDICATIONS: Current Outpatient Prescriptions on File Prior to Visit  Medication Sig Dispense Refill  . amLODipine (NORVASC) 10 MG tablet Take 1 tablet (10 mg total) by mouth daily. 30 tablet 5  . aspirin 81 MG chewable tablet Place 1 tablet (81 mg total) into feeding tube daily.    . Cholecalciferol (VITAMIN D-3) 1000 UNITS CAPS Take 2,000 Units by mouth daily.    . citalopram (CELEXA) 40 MG tablet TAKE 1 TABLET BY MOUTH EVERY DAY FOR MOOD 30 tablet 5  . famotidine (PEPCID) 20 MG  tablet TAKE 1 TABLET (20 MG TOTAL) BY MOUTH 2 (TWO) TIMES DAILY. 60 tablet 5  . hydrALAZINE (APRESOLINE) 50 MG tablet Take 1 tablet (50 mg total) by mouth every 8 (eight) hours. 90 tablet 1  . Lacosamide 100 MG TABS Take 1 tablet (100 mg total) by mouth 2 (two) times daily. 60 tablet 1  . levETIRAcetam (KEPPRA) 1000 MG tablet Take 1 tablet (1,000 mg total) by mouth 2 (two) times daily. 60 tablet 1  . LORazepam (ATIVAN) 1 MG tablet Take 1 tablet (1 mg total) by mouth 2 (two) times daily. 60 tablet 2  . Multiple Vitamin (MULTIVITAMIN WITH MINERALS) TABS tablet Take 1 tablet by mouth daily.    . nebivolol (BYSTOLIC) 5 MG tablet Take 1 tablet (5 mg total) by mouth daily. 30 tablet 5  . Omega-3 Fatty Acids (FISH OIL BURP-LESS PO) Take 1 tablet by mouth daily.      Marland Kitchen oxyCODONE (ROXICODONE) 15 MG immediate release tablet Take 1 tablet (15 mg total) by mouth every 6 (six) hours as needed for pain. 120 tablet 0  . albuterol (PROVENTIL HFA;VENTOLIN HFA) 108 (90 BASE) MCG/ACT inhaler Inhale 2 puffs into the lungs every 2 (two) hours as needed for wheezing or shortness of breath (cough). (Patient not taking: Reported on 05/13/2015) 1 Inhaler 2   No current facility-administered medications on file prior to visit.  ALLERGIES: Allergies  Allergen Reactions  . Morphine And Related Anaphylaxis and Shortness Of Breath  . Montelukast Other (See Comments)    unknown  . Neurontin [Gabapentin] Other (See Comments)    delusional  . Wellbutrin [Bupropion] Palpitations    Insomnia    FAMILY HISTORY: Family History  Problem Relation Age of Onset  . Hypertension Mother   . Hypertension Father     SOCIAL HISTORY: Social History   Social History  . Marital Status: Married    Spouse Name: N/A  . Number of Children: 1  . Years of Education: N/A   Occupational History  . Disabled    Social History Main Topics  . Smoking status: Former Games developer  . Smokeless tobacco: Never Used  . Alcohol Use: No  .  Drug Use: No  . Sexual Activity: Not on file   Other Topics Concern  . Not on file   Social History Narrative    REVIEW OF SYSTEMS: Constitutional: No fevers, chills, or sweats, no generalized fatigue, change in appetite Eyes: No visual changes, double vision, eye pain Ear, nose and throat: No hearing loss, ear pain, nasal congestion, sore throat Cardiovascular: No chest pain, palpitations Respiratory:  No shortness of breath at rest or with exertion, wheezes GastrointestinaI: No nausea, vomiting, diarrhea, abdominal pain, fecal incontinence Genitourinary:  No dysuria, urinary retention or frequency Musculoskeletal:  No neck pain, +back pain Integumentary: No rash, pruritus, skin lesions Neurological: as above Psychiatric: No depression, insomnia, anxiety Endocrine: No palpitations, fatigue, diaphoresis, mood swings, change in appetite, change in weight, increased thirst Hematologic/Lymphatic:  No anemia, purpura, petechiae. Allergic/Immunologic: no itchy/runny eyes, nasal congestion, recent allergic reactions, rashes  PHYSICAL EXAM: Filed Vitals:   05/13/15 1248  BP: 120/66  Pulse: 53   General: No acute distress Head:  Normocephalic/atraumatic Eyes: Fundoscopic exam shows bilateral sharp discs, no vessel changes, exudates, or hemorrhages Neck: supple, no paraspinal tenderness, full range of motion Back: No paraspinal tenderness Heart: regular rate and rhythm Lungs: Clear to auscultation bilaterally. Vascular: No carotid bruits. Skin/Extremities: No rash, no edema Neurological Exam: Mental status: alert and oriented to person, place, and time, no dysarthria or aphasia, Fund of knowledge is appropriate.  Recent and remote memory are intact.  Attention and concentration are normal.    Able to name objects and repeat phrases. Cranial nerves: CN I: not tested CN II: pupils equal, round and reactive to light, visual fields intact, fundi unremarkable. CN III, IV, VI:  full  range of motion, no nystagmus, no ptosis CN V: facial sensation intact CN VII: upper and lower face symmetric CN VIII: hearing intact to finger rub CN IX, X: gag intact, uvula midline CN XI: sternocleidomastoid and trapezius muscles intact CN XII: tongue midline Bulk & Tone: normal, no fasciculations. Motor: 5/5 throughout with no pronator drift. Sensation: intact to light touch, cold, pin, vibration and joint position sense.  No extinction to double simultaneous stimulation.  Romberg test negative Deep Tendon Reflexes: +2 throughout, no ankle clonus Plantar responses: downgoing bilaterally Cerebellar: no incoordination on finger to nose, heel to shin. No dysdiadochokinesia Gait: narrow-based and steady, mild difficulty with tandem walk due to left leg pain Tremor: none  IMPRESSION: This is a 58 year old right-handed man with a history of hypertension, hyperlipidemia, anxiety, chronic back pain, with a prolonged hospital admission last September 2016 for septic shock with multi-organ failure, complicated by posterior reversible encephalopathy with symptomatic seizure. His MRI had shown typical findings seen in PRES, and his EEG  had shown seizures arising from the right temporal region and right frontotemporal epileptiform discharges. He is tolerating Keppra 1000mg  BID and Vimpat 100mg  BID with no side effects and no further seizures since hospital stay. We discussed that if repeat MRI brain and EEG are normal, we can start tapering off his medications after 6 months from the seizure.  driving laws were discussed with the patient, and he knows to stop driving after a seizure, until 6 months seizure-free. He will follow-up in March, at which point if tests are normal, we will give him tapering instructions for seizure medications.  Thank you for allowing me to participate in the care of this patient. Please do not hesitate to call for any questions or concerns.   Patrcia Dolly, M.D.

## 2015-05-13 NOTE — Patient Instructions (Signed)
1. Schedule MRI brain with and without contrast 2. Schedule routine EEG 3. Continue Keppra and Vimpat as instructed 4. As per [place] driving laws, after a seizure, one should not drive until 6 months seizure-free  Seizure Precautions: 1. If medication has been prescribed for you to prevent seizures, take it exactly as directed.  Do not stop taking the medicine without talking to your doctor first, even if you have not had a seizure in a long time.   2. Avoid activities in which a seizure would cause danger to yourself or to others.  Don't operate dangerous machinery, swim alone, or climb in high or dangerous places, such as on ladders, roofs, or girders.  Do not drive unless your doctor says you may.  3. If you have any warning that you may have a seizure, lay down in a safe place where you can't hurt yourself.    4.  No driving for 6 months from last seizure, as per [facility].   Please refer to the following link on the Epile[employer] of [employer]'s website for more information: http://www.epilepsyfoundation.org/answerplace/Social/driving/drivingu.cfm   5.  Maintain good sleep hygiene. Avoid alcohol.  6.  Contact your doctor if you have any problems that may be related to the medicine you are taking.  7.  Call 911 and bring the patient back to the ED if:        A.  The seizure lasts longer than 5 minutes.       B.  The patient doesn't awaken shortly after the seizure  C.  The patient has new problems such as difficulty seeing, speaking or moving  D.  The patient was injured during the seizure  E.  The patient has a temperature over 102 F (39C)  F.  The patient vomited and now is having trouble breathing

## 2015-05-18 DIAGNOSIS — R569 Unspecified convulsions: Secondary | ICD-10-CM | POA: Insufficient documentation

## 2015-05-18 DIAGNOSIS — I6783 Posterior reversible encephalopathy syndrome: Secondary | ICD-10-CM | POA: Insufficient documentation

## 2015-05-20 ENCOUNTER — Telehealth: Payer: Self-pay | Admitting: Physician Assistant

## 2015-05-20 NOTE — Telephone Encounter (Signed)
Requesting Oxycodone 15mg  immediate release-Take 1 tablet by mouth every 6 hours as needed for pain. Last refill:04/22/15;#120,0 Last OV:04/22/15 No UDS Please advise.//AB/CMA

## 2015-05-20 NOTE — Telephone Encounter (Signed)
Caller name: Self   Can be reached: [phone] or [phone]    Reason for call: Requesting refill on oxyCODONE (ROXICODONE) 15 MG immediate release tablet [[id number]]

## 2015-05-21 MED ORDER — OXYCODONE HCL 15 MG PO TABS: 15.0000 mg | ORAL_TABLET | Freq: Four times a day (QID) | ORAL | Status: DC | PRN

## 2015-05-21 NOTE — Telephone Encounter (Signed)
Okay #120 no refill, needs UDS per PCP

## 2015-05-21 NOTE — Telephone Encounter (Signed)
Rx printed and placed in Dr. Leta Jungling red folder for review and signature.

## 2015-05-21 NOTE — Addendum Note (Signed)
Addended by: Tylene Fantasia on: 05/21/2015 04:48 PM   Modules accepted: Orders

## 2015-05-21 NOTE — Telephone Encounter (Signed)
Routed to Dr. Paz (covering provider for Cody Martin, PA-C).   

## 2015-05-22 ENCOUNTER — Encounter (HOSPITAL_COMMUNITY): Payer: Self-pay | Admitting: Emergency Medicine

## 2015-05-22 ENCOUNTER — Emergency Department (HOSPITAL_COMMUNITY)
Admission: EM | Admit: 2015-05-22 | Discharge: 2015-05-22 | Disposition: A | Discharge status: Home / Self Care | Payer: Medicare Other | Attending: Emergency Medicine | Admitting: Emergency Medicine

## 2015-05-22 DIAGNOSIS — Z87438 Personal history of other diseases of male genital organs: Secondary | ICD-10-CM | POA: Diagnosis not present

## 2015-05-22 DIAGNOSIS — M549 Dorsalgia, unspecified: Secondary | ICD-10-CM

## 2015-05-22 DIAGNOSIS — Z9889 Other specified postprocedural states: Secondary | ICD-10-CM | POA: Insufficient documentation

## 2015-05-22 DIAGNOSIS — Z79899 Other long term (current) drug therapy: Secondary | ICD-10-CM | POA: Insufficient documentation

## 2015-05-22 DIAGNOSIS — M1990 Unspecified osteoarthritis, unspecified site: Secondary | ICD-10-CM | POA: Insufficient documentation

## 2015-05-22 DIAGNOSIS — G8929 Other chronic pain: Secondary | ICD-10-CM | POA: Insufficient documentation

## 2015-05-22 DIAGNOSIS — E7800 Pure hypercholesterolemia, unspecified: Secondary | ICD-10-CM | POA: Diagnosis not present

## 2015-05-22 DIAGNOSIS — M545 Low back pain: Secondary | ICD-10-CM | POA: Insufficient documentation

## 2015-05-22 DIAGNOSIS — E785 Hyperlipidemia, unspecified: Secondary | ICD-10-CM | POA: Insufficient documentation

## 2015-05-22 DIAGNOSIS — Z7982 Long term (current) use of aspirin: Secondary | ICD-10-CM | POA: Diagnosis not present

## 2015-05-22 DIAGNOSIS — E669 Obesity, unspecified: Secondary | ICD-10-CM | POA: Diagnosis not present

## 2015-05-22 DIAGNOSIS — I10 Essential (primary) hypertension: Secondary | ICD-10-CM | POA: Insufficient documentation

## 2015-05-22 DIAGNOSIS — M542 Cervicalgia: Secondary | ICD-10-CM | POA: Insufficient documentation

## 2015-05-22 DIAGNOSIS — Z87891 Personal history of nicotine dependence: Secondary | ICD-10-CM | POA: Insufficient documentation

## 2015-05-22 DIAGNOSIS — F329 Major depressive disorder, single episode, unspecified: Secondary | ICD-10-CM | POA: Diagnosis not present

## 2015-05-22 DIAGNOSIS — F419 Anxiety disorder, unspecified: Secondary | ICD-10-CM | POA: Insufficient documentation

## 2015-05-22 MED ORDER — HYDROMORPHONE HCL 1 MG/ML IJ SOLN
1.0000 mg | Freq: Once | INTRAMUSCULAR | Status: AC
  Administered 2015-05-22: 1 mg via INTRAMUSCULAR
  Filled 2015-05-22: qty 1

## 2015-05-22 MED ORDER — OXYCODONE HCL 10 MG PO TABS: 10.0000 mg | ORAL_TABLET | Freq: Four times a day (QID) | ORAL | Status: DC | PRN

## 2015-05-22 MED ORDER — LORAZEPAM 1 MG PO TABS
1.0000 mg | ORAL_TABLET | Freq: Once | ORAL | Status: AC
  Administered 2015-05-22: 1 mg via ORAL
  Filled 2015-05-22: qty 1

## 2015-05-22 NOTE — Discharge Instructions (Signed)
Return here as needed.  Follow-up with your doctor next week

## 2015-05-22 NOTE — ED Notes (Signed)
Pt c/o back and neck pain onset yesterday. Hx of spinal surgery. No bowel or bladder changes or incontinence. Ambulatory without difficulty.

## 2015-05-22 NOTE — ED Provider Notes (Signed)
CSN: [id number]     Arrival date & time 05/22/15  4098 History   First MD Initiated Contact with Patient 05/22/15 0818     Chief Complaint  Patient presents with  . Back Pain     (Consider location/radiation/quality/duration/timing/severity/associated sxs/prior Treatment) HPI Patient presents to the emergency department with chronic low back pain and neck pain.  The patient states that he ran out of his medicines yesterday.  He states that he is not able to refill his prescriptions [facility] week.  Patient states that he last received a prescription one month ago.  He states that they are not back in the office until next Wednesday.  Patient denies incontinence, nausea, vomiting, abdominal pain, weakness, numbness, shortness of breath, chest pain, fever, dysuria, incontinence, lightheadedness, near syncope or syncope.  The patient states that movement and palpation make the pain worse Past Medical History  Diagnosis Date  . Chest pain   . HTN (hypertension)   . Hyperlipemia   . DJD (degenerative joint disease)   . BPH (benign prostatic hypertrophy)   . Obesity   . OSA (obstructive sleep apnea)   . Hypercholesteremia   . Chronic back pain   . Anxiety   . Depression    Past Surgical History  Procedure Laterality Date  . Back surgery      Lspine w/Hardware  . Prostatectomy    . Cervical spine surgery      w/Hardware  . Laparotomy N/A 01/08/2015    Procedure: EXPLORATORY LAPAROTOMY;  Surgeon: Manus Rudd, MD;  Location: [facility] OR;  Service: General;  Laterality: N/A;  . Application of wound vac N/A 01/08/2015    Procedure: APPLICATION OF WOUND VAC;  Surgeon: Manus Rudd, MD;  Location: [facility] OR;  Service: General;  Laterality: N/A;  . Wound debridement N/A 01/11/2015    Procedure: DEBRIDEMENT WOUND AND VAC DRESSING CHANGE;  Surgeon: Manus Rudd, MD;  Location: [facility] OR;  Service: General;  Laterality: N/A;  . Cardiac catheterization N/A 01/13/2015    Procedure: Left Heart Cath and Coronary  Angiography;  Surgeon: Corky Crafts, MD;  Location: [facility] INVASIVE CV LAB;  Service: Cardiovascular;  Laterality: N/A;   Family History  Problem Relation Age of Onset  . Hypertension Mother   . Hypertension Father    Social History  Substance Use Topics  . Smoking status: Former Games developer  . Smokeless tobacco: Never Used  . Alcohol Use: No    Review of Systems All other systems negative except as documented in the HPI. All pertinent positives and negatives as reviewed in the HPI.   Allergies  Morphine and related; Montelukast; Neurontin; and Wellbutrin  Home Medications   Prior to Admission medications   Medication Sig Start Date End Date Taking? Authorizing Provider  amLODipine (NORVASC) 10 MG tablet Take 1 tablet (10 mg total) by mouth daily. 03/30/15  Yes Waldon Merl, PA-C  aspirin 81 MG chewable tablet Place 1 tablet (81 mg total) into feeding tube daily. Patient taking differently: Chew 81 mg by mouth daily.  02/11/15  Yes Bernadene Person, NP  Cholecalciferol (VITAMIN D-3) 1000 UNITS CAPS Take 2,000 Units by mouth daily.   Yes Historical Provider, MD  citalopram (CELEXA) 40 MG tablet TAKE 1 TABLET BY MOUTH EVERY DAY FOR MOOD 03/20/15  Yes Waldon Merl, PA-C  famotidine (PEPCID) 20 MG tablet TAKE 1 TABLET (20 MG TOTAL) BY MOUTH 2 (TWO) TIMES DAILY. 04/24/15  Yes Waldon Merl, PA-C  hydrALAZINE (APRESOLINE) 50 MG tablet Take  1 tablet (50 mg total) by mouth every 8 (eight) hours. 04/10/15  Yes Waldon Merl, PA-C  Lacosamide 100 MG TABS Take 1 tablet (100 mg total) by mouth 2 (two) times daily. 04/03/15  Yes Waldon Merl, PA-C  levETIRAcetam (KEPPRA) 1000 MG tablet Take 1 tablet (1,000 mg total) by mouth 2 (two) times daily. 04/02/15  Yes Waldon Merl, PA-C  LORazepam (ATIVAN) 1 MG tablet Take 1 mg by mouth 2 (two) times daily.   Yes Historical Provider, MD  Multiple Vitamin (MULTIVITAMIN WITH MINERALS) TABS tablet Take 1 tablet by mouth daily. 02/11/15   Yes Bernadene Person, NP  naproxen sodium (ANAPROX) 220 MG tablet Take 660 mg by mouth 2 (two) times daily as needed (pain.).   Yes Historical Provider, MD  nebivolol (BYSTOLIC) 5 MG tablet Take 1 tablet (5 mg total) by mouth daily. 03/20/15  Yes Waldon Merl, PA-C  Omega-3 Fatty Acids (FISH OIL BURP-LESS PO) Take 1 tablet by mouth daily.     Yes Historical Provider, MD  oxyCODONE (ROXICODONE) 15 MG immediate release tablet Take 1 tablet (15 mg total) by mouth every 6 (six) hours as needed for pain. 05/21/15  Yes Wanda Plump, MD  albuterol (PROVENTIL HFA;VENTOLIN HFA) 108 (90 BASE) MCG/ACT inhaler Inhale 2 puffs into the lungs every 2 (two) hours as needed for wheezing or shortness of breath (cough). Patient not taking: Reported on 05/13/2015 12/17/14   Toni Amend Forcucci, PA-C  LORazepam (ATIVAN) 1 MG tablet Take 1 tablet (1 mg total) by mouth 2 (two) times daily. Patient not taking: Reported on 05/22/2015 04/22/15   Waldon Merl, PA-C   BP 164/83 mmHg  Pulse 56  Temp(Src) 98 F (36.7 C) (Oral)  Resp 16  SpO2 100% Physical Exam  Constitutional: He is oriented to person, place, and time. He appears well-developed and well-nourished. No distress.  HENT:  Head: Normocephalic and atraumatic.  Mouth/Throat: Oropharynx is clear and moist.  Eyes: Pupils are equal, round, and reactive to light.  Neck: Normal range of motion. Neck supple.  Cardiovascular: Normal rate, regular rhythm and normal heart sounds.  Exam reveals no gallop and no friction rub.   No murmur heard. Pulmonary/Chest: Effort normal and breath sounds normal. No respiratory distress. He has no wheezes.  Musculoskeletal:       Back:  Neurological: He is alert and oriented to person, place, and time. He exhibits normal muscle tone. Coordination normal.  Skin: Skin is warm and dry. No rash noted. No erythema.  Psychiatric: He has a normal mood and affect. His behavior is normal.  Nursing note and vitals  reviewed.   ED Course  Procedures (including critical care time) Labs Review Labs Reviewed - No data to display  Imaging Review No results found. I have personally reviewed and evaluated these images and lab results as part of my medical decision-making.  Patient is advised that we can only write a limited supply medication for him.  He will need to call his doctor, soon as possible for follow-up.  Told to return here as needed.  Patient agrees the plan and all questions were answered   Charlestine Night, PA-C 05/22/15 1660  Raeford Razor, MD 05/29/15 1158

## 2015-05-22 NOTE — ED Notes (Signed)
During discharge process, pt became belligerent, using profanity over the fact he is "only receiving 10mg  oxycodone'.  After attempting to de-escalate patient, he increased his profanity and voice. Wife became tearful while also attempting to calm him down. Wheelchair provided, after transporting pt to car, he started yelling for [place] to "shove the wheelchair up your ass."

## 2015-05-22 NOTE — Telephone Encounter (Signed)
Please inform Pt that Rx has been placed at front desk for pick up. Thank you.  

## 2015-05-26 ENCOUNTER — Ambulatory Visit (HOSPITAL_COMMUNITY): Admission: RE | Admit: 2015-05-26 | Payer: Medicare Other | Source: Ambulatory Visit

## 2015-05-28 ENCOUNTER — Other Ambulatory Visit (HOSPITAL_BASED_OUTPATIENT_CLINIC_OR_DEPARTMENT_OTHER): Payer: Self-pay | Admitting: Pharmacy Technician

## 2015-05-28 ENCOUNTER — Telehealth: Payer: Self-pay | Admitting: Physician Assistant

## 2015-05-28 ENCOUNTER — Other Ambulatory Visit: Payer: Self-pay | Admitting: Physician Assistant

## 2015-05-28 DIAGNOSIS — G894 Chronic pain syndrome: Secondary | ICD-10-CM | POA: Diagnosis not present

## 2015-05-28 DIAGNOSIS — M961 Postlaminectomy syndrome, not elsewhere classified: Secondary | ICD-10-CM | POA: Diagnosis not present

## 2015-05-28 DIAGNOSIS — F329 Major depressive disorder, single episode, unspecified: Secondary | ICD-10-CM | POA: Diagnosis not present

## 2015-05-28 DIAGNOSIS — F411 Generalized anxiety disorder: Secondary | ICD-10-CM | POA: Diagnosis not present

## 2015-05-28 MED ORDER — LEVETIRACETAM 1000 MG PO TABS: 1000.0000 mg | ORAL_TABLET | Freq: Two times a day (BID) | ORAL | Status: DC

## 2015-05-28 MED ORDER — LACOSAMIDE 100 MG PO TABS: 100.0000 mg | ORAL_TABLET | Freq: Two times a day (BID) | ORAL | Status: DC

## 2015-05-28 MED ORDER — CARVEDILOL 6.25 MG PO TABS
6.2500 mg | ORAL_TABLET | Freq: Two times a day (BID) | ORAL | Status: DC
Start: 2015-05-28 — End: 2015-09-28

## 2015-05-28 MED FILL — CARVEDILOL 6.25 MG TABLET: 6.25 | 30 days supply | Qty: 60 | Fill #0

## 2015-05-28 MED FILL — VIMPAT 100 MG TABLET: 100 | 30 days supply | Qty: 60 | Fill #0

## 2015-05-28 MED FILL — AMLODIPINE BESYLATE 10 MG T: 10 | 30 days supply | Qty: 30 | Fill #2

## 2015-05-28 MED FILL — LORazepam 1 MG TABS: 1 | 30 days supply | Qty: 60 | Fill #1

## 2015-05-28 MED FILL — FAMOTIDINE 20 MG TABLET: 20 | 30 days supply | Qty: 60 | Fill #1

## 2015-05-28 MED FILL — levETIRAcetam 1000 MG TABS: 1000 | 30 days supply | Qty: 60 | Fill #0

## 2015-05-28 MED FILL — CITALOPRAM HBR 40 MG TABLET: 40 | 30 days supply | Qty: 30 | Fill #2

## 2015-05-28 NOTE — Telephone Encounter (Signed)
Caller name: Carmen ° °Relationship to patient: spouse  ° °Can be reached: [phone] ° °Pharmacy: [facility].  ° °Reason for call: pt is requesting a refill on 2 medications. She says that the pharmacy has already sent request. She says that she will be at the pharmacy in 45 minutes she would like to have it ready for pick up then. Spouse is requesting a quantity of 120 for both medications.   ° °1. levETIRAcetam  2. Vimpat  ° ° °

## 2015-05-28 NOTE — Telephone Encounter (Signed)
Spouse states that pt also needs carvedilol 6.25 mg, 2 tabs per day, states Cody had changed his bp meds rx   nebivolol (BYSTOLIC) 5 MG tablet  To the carvedilol however the pharmacy states they don't have the rx .

## 2015-05-28 NOTE — Telephone Encounter (Signed)
Caller name: Porfirio Mylar  Relationship to patient: spouse   Can be reached: [phone]  Pharmacy: [facility].   Reason for call: pt is requesting a refill on 2 medications. She says that the pharmacy has already sent request. She says that she will be at the pharmacy in 45 minutes she would like to have it ready for pick up then. Spouse is requesting a quantity of 120 for both medications.   1. levETIRAcetam 2. Vimpat

## 2015-05-28 NOTE — Telephone Encounter (Addendum)
Levetiracetam  Last filled: 04/02/15 Amt: 60,1  Vimpat Last filled: 04/03/15 Amt: 60,1   Pt here in office. Also requesting Carvedilol 6.25 mg 1 tablet by mouth 2 time daily.  This med is to replace Nebivolol HCL due to insurance not paying.    Spoke to [facility] regarding refill requests.  Verbal order given for 3 month supply for all three.  Rx printed and signed by Dr. Abner Greenspan.

## 2015-06-01 ENCOUNTER — Other Ambulatory Visit: Payer: Medicare Other

## 2015-06-04 ENCOUNTER — Ambulatory Visit: Payer: Medicare Other | Admitting: Cardiovascular Disease

## 2015-06-08 ENCOUNTER — Ambulatory Visit (HOSPITAL_COMMUNITY)
Admission: RE | Admit: 2015-06-08 | Discharge: 2015-06-08 | Disposition: A | Discharge status: Home / Self Care | Payer: Medicare Other | Source: Ambulatory Visit | Attending: Neurology | Admitting: Neurology

## 2015-06-08 DIAGNOSIS — G9389 Other specified disorders of brain: Secondary | ICD-10-CM | POA: Insufficient documentation

## 2015-06-08 DIAGNOSIS — I6783 Posterior reversible encephalopathy syndrome: Secondary | ICD-10-CM | POA: Diagnosis not present

## 2015-06-08 DIAGNOSIS — R569 Unspecified convulsions: Secondary | ICD-10-CM | POA: Diagnosis not present

## 2015-06-08 LAB — POCT I-STAT CREATININE: Creatinine, Ser: 1.3 mg/dL — ABNORMAL HIGH (ref 0.61–1.24)

## 2015-06-08 MED ORDER — GADOBENATE DIMEGLUMINE 529 MG/ML IV SOLN
20.0000 mL | Freq: Once | INTRAVENOUS | Status: AC | PRN
  Administered 2015-06-08: 20 mL via INTRAVENOUS

## 2015-06-09 ENCOUNTER — Other Ambulatory Visit: Payer: Self-pay | Admitting: Physician Assistant

## 2015-06-10 ENCOUNTER — Other Ambulatory Visit: Payer: Self-pay | Admitting: Physician Assistant

## 2015-06-10 MED FILL — hydrALAZINE HCL 50 MG TABS: 50 | 30 days supply | Qty: 90 | Fill #0

## 2015-06-15 ENCOUNTER — Ambulatory Visit (INDEPENDENT_AMBULATORY_CARE_PROVIDER_SITE_OTHER): Payer: Medicare Other | Admitting: Neurology

## 2015-06-15 DIAGNOSIS — R569 Unspecified convulsions: Secondary | ICD-10-CM

## 2015-06-24 ENCOUNTER — Encounter: Payer: Self-pay | Admitting: Neurology

## 2015-06-24 ENCOUNTER — Ambulatory Visit (INDEPENDENT_AMBULATORY_CARE_PROVIDER_SITE_OTHER): Payer: Medicare Other | Admitting: Neurology

## 2015-06-24 VITALS — BP 138/90 | HR 66 | Ht 76.0 in | Wt 240.0 lb

## 2015-06-24 DIAGNOSIS — M961 Postlaminectomy syndrome, not elsewhere classified: Secondary | ICD-10-CM | POA: Diagnosis not present

## 2015-06-24 DIAGNOSIS — R569 Unspecified convulsions: Secondary | ICD-10-CM | POA: Diagnosis not present

## 2015-06-24 DIAGNOSIS — I6783 Posterior reversible encephalopathy syndrome: Secondary | ICD-10-CM | POA: Diagnosis not present

## 2015-06-24 DIAGNOSIS — M25552 Pain in left hip: Secondary | ICD-10-CM | POA: Diagnosis not present

## 2015-06-24 MED ORDER — LEVETIRACETAM 1000 MG PO TABS: 1000.0000 mg | ORAL_TABLET | Freq: Two times a day (BID) | ORAL | Status: DC

## 2015-06-24 MED ORDER — LACOSAMIDE 100 MG PO TABS: 100.0000 mg | ORAL_TABLET | Freq: Two times a day (BID) | ORAL | Status: DC

## 2015-06-24 MED FILL — levETIRAcetam 1000 MG TABS: 1000 | 90 days supply | Qty: 180 | Fill #0

## 2015-06-24 NOTE — Procedures (Signed)
ELECTROENCEPHALOGRAM REPORT  Date of Study: 06/15/2015  Patient's Name: Jared [name] MRN: [id number] Date of Birth: January 26, 1956  Referring Provider: Dr. 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 Follow-up EEG ordered.  Medications: Keppra, Vimpat, Norvasc, oxycodone  Technical Summary: A multichannel digital EEG recording measured by the international 10-20 system with electrodes applied with paste and impedances below 5000 ohms performed in our laboratory with EKG monitoring in an awake and drowsy patient.  Hyperventilation and photic stimulation were performed.  The digital EEG was referentially recorded, reformatted, and digitally filtered in a variety of bipolar and referential montages for optimal display.    Description: The patient is awake and drowsy during the recording.  During maximal wakefulness, there is a symmetric, medium voltage 9 Hz posterior dominant rhythm that attenuates with eye opening.  The record is symmetric.  During drowsiness, there is an increase in theta slowing of the background. Deeper stages of sleep were not seen.  Hyperventilation and photic stimulation did not elicit any abnormalities.  There were no epileptiform discharges or electrographic seizures seen.    EKG lead was unremarkable.  Impression: This awake and drowsy EEG is normal.    Clinical Correlation: A normal EEG does not exclude a clinical diagnosis of epilepsy.  If further clinical questions remain, prolonged EEG may be helpful.  Clinical correlation is advised.   Patrcia Dolly, M.D.

## 2015-06-24 NOTE — Patient Instructions (Signed)
1. Start tapering off Vimpat mid-March: Take 1/2 tablet in AM, 1 tablet in PM for 1 week, then 1/2 tablet twice a day for 1 week, then 1/2 tablet in PM for 1 week, then stop. 2. Continue Keppra 1000mg  twice a day 3. Schedule 24-hour EEG for April 4. Follow-up after EEG, call for any change in symptoms  Seizure Precautions: 1. If medication has been prescribed for you to prevent seizures, take it exactly as directed.  Do not stop taking the medicine without talking to your doctor first, even if you have not had a seizure in a long time.   2. Avoid activities in which a seizure would cause danger to yourself or to others.  Don't operate dangerous machinery, swim alone, or climb in high or dangerous places, such as on ladders, roofs, or girders.  Do not drive unless your doctor says you may.  3. If you have any warning that you may have a seizure, lay down in a safe place where you can't hurt yourself.    4.  No driving for 6 months from last seizure, as per [facility].   Please refer to the following link on the [employer]'s website for more information: http://www.epilepsyfoundation.org/answerplace/Social/driving/drivingu.cfm   5.  Maintain good sleep hygiene. Avoid alcohol  6.  Contact your doctor if you have any problems that may be related to the medicine you are taking.  7.  Call 911 and bring the patient back to the ED if:        A.  The seizure lasts longer than 5 minutes.       B.  The patient doesn't awaken shortly after the seizure  C.  The patient has new problems such as difficulty seeing, speaking or moving  D.  The patient was injured during the seizure  E.  The patient has a temperature over 102 F (39C)  F.  The patient vomited and now is having trouble breathing

## 2015-06-24 NOTE — Progress Notes (Signed)
NEUROLOGY FOLLOW UP OFFICE NOTE  Jared [name] [id number]  HISTORY OF PRESENT ILLNESS: I had the pleasure of seeing Jared [name] in follow-up in the neurology clinic on 06/24/2015.  The patient was last seen on 6 weeks ago for seizures that occurred in the setting of PRES last September 2016. He is again accompanied by his wife who helps supplement the history today.  Records and images were personally reviewed where available.  I personally reviewed MRI brain with and without contrast which showed resolution of signal changes in the posterior region. There is note of encephalomalacia in the inferior posterior right occipital lobe and watershed zone compatible with previous cortical infarct. His wake and drowsy EEG was normal. He continues to do well with no further seizures or seizure-like symptoms on Keppra 1000mg  BID and Vimpat 100mg  BID.   HPI: This is a pleasant 60 yo LH man with a history of hypertension, hyperlipidemia, chronic back pain, anxiety, who had a complicated hospital course from August to September 2016. Hospital records were reviewed. He was found unresponsive by his wife on 01/08/15. He was noted to respond to BiPAP and Narcan, but was cyanotic and hypotensive. He had been complaining of chest and abdominal pain. He was intubated and apparently went into PEA arrest. He underwent exploratory laparotomy which did not show any intra-abdominal cause of acute decompensation. Cardiac cath negative. He was in the ICU for acute respiratory failure, severe septic shock, acute renal failure requiring dialysis, shock liver, and started to improve clinically. He was transferred to the floor, per notes having difficulties with dialysis, with elevated BP, and had a witnessed seizure followed by respiratory arrest and again intubation. He recalls feeling unwell that day, nauseated and in pain. His wife came to his room and he was staring up at the ceiling (he does not recall this), he was seen by his  doctors and was responding, then soon after his wife noticed his right arm was jerking, followed by whole body jerking. He was apparently able to answer questions initially, then a code blue was called and patient was re-intubated. He underwent overnight EEG, which showed right temporal non-convulsive seizures, which resolved over the course of the recording. There were right frontotemporal epileptiform discharges, often periodic in the initial portion of the recording, but largely isolated discharges by the end of the recording. Diffuse slowing with polymorphic theta and delta, that started to resolve by the end of the recording, persistent asymmetry, with greater slowing on the right, but also resolving over the course of recording. He had an MRI brain with and without contrast done 02/05/15 which I personally reviewed, showing increased FLAIR and T2 signal in the bilateral occipital and parietal lobes, posterior limb of the left internal capsul and corticospinal tracts, and left cerebellum, no restricted diffusion, compatible with posterior reversible encephalopathy syndrome. He continued to improve clinically, and was discharged home on Keppra 1000mg  BID and Vimpat 100mg  BID. He denies any side effects on the medications.   He continues to do well, with no further seizures or seizure-like symptoms. He continues to deal with anxiety, and feels that this may have been the cause of his initial symptoms, reporting that he had seen his previous PCP and reporting anxiety, started on Wellbutrin which did not help, and was given Lyrica, which he took for 2 days, then he came in unresponsive to the hospital. He denies any significant headaches, he denies any dizziness bu continues to work on his balance since deconditioning from  his hospital stay, working with PT and doing home exercises. He denies any diplopia, dysarthria, dysphagia, focal numbness/tingling/weakness, bowel/bladder dysfunction. No further dialysis  needed since hospital discharges. He has chronic back and left leg pain. He denies any olfactory/gustatory hallucinations, deja vu, rising epigastric sensation, focal numbness/tingling/weakness, myoclonic jerks. He had a normal birth and early development. There is no history of febrile convulsions, CNS infections such as meningitis/encephalitis, significant traumatic brain injury requiring neurosurgical procedures, or family history of seizures  PAST MEDICAL HISTORY: Past Medical History  Diagnosis Date  . Chest pain   . HTN (hypertension)   . Hyperlipemia   . DJD (degenerative joint disease)   . BPH (benign prostatic hypertrophy)   . Obesity   . OSA (obstructive sleep apnea)   . Hypercholesteremia   . Chronic back pain   . Anxiety   . Depression     MEDICATIONS: Current Outpatient Prescriptions on File Prior to Visit  Medication Sig Dispense Refill  . amLODipine (NORVASC) 10 MG tablet Take 1 tablet (10 mg total) by mouth daily. 30 tablet 5  . carvedilol (COREG) 6.25 MG tablet Take 1 tablet (6.25 mg total) by mouth 2 (two) times daily with a meal. 180 tablet 0  . Cholecalciferol (VITAMIN D-3) 1000 UNITS CAPS Take 2,000 Units by mouth daily.    . citalopram (CELEXA) 40 MG tablet TAKE 1 TABLET BY MOUTH EVERY DAY FOR MOOD 30 tablet 5  . famotidine (PEPCID) 20 MG tablet TAKE 1 TABLET (20 MG TOTAL) BY MOUTH 2 (TWO) TIMES DAILY. 60 tablet 5  . hydrALAZINE (APRESOLINE) 50 MG tablet TAKE 1 TABLET BY MOUTH EVERY 8 HOURS 90 tablet 3  . Lacosamide 100 MG TABS Take 1 tablet (100 mg total) by mouth 2 (two) times daily. 180 tablet 0  . levETIRAcetam (KEPPRA) 1000 MG tablet Take 1 tablet (1,000 mg total) by mouth 2 (two) times daily. 180 tablet 0  . LORazepam (ATIVAN) 1 MG tablet Take 1 tablet (1 mg total) by mouth 2 (two) times daily. 60 tablet 2  . LORazepam (ATIVAN) 1 MG tablet Take 1 mg by mouth 2 (two) times daily.    . Multiple Vitamin (MULTIVITAMIN WITH MINERALS) TABS tablet Take 1 tablet by  mouth daily.    . naproxen sodium (ANAPROX) 220 MG tablet Take 660 mg by mouth 2 (two) times daily as needed (pain.).    Marland Kitchen nebivolol (BYSTOLIC) 5 MG tablet Take 1 tablet (5 mg total) by mouth daily. 30 tablet 5  . Omega-3 Fatty Acids (FISH OIL BURP-LESS PO) Take 1 tablet by mouth daily.       No current facility-administered medications on file prior to visit.    ALLERGIES: Allergies  Allergen Reactions  . Morphine And Related Anaphylaxis and Shortness Of Breath  . Montelukast Other (See Comments)    unknown  . Neurontin [Gabapentin] Other (See Comments)    delusional  . Wellbutrin [Bupropion] Palpitations    Insomnia    FAMILY HISTORY: Family History  Problem Relation Age of Onset  . Hypertension Mother   . Hypertension Father     SOCIAL HISTORY: Social History   Social History  . Marital Status: Married    Spouse Name: N/A  . Number of Children: 1  . Years of Education: N/A   Occupational History  . Disabled    Social History Main Topics  . Smoking status: Former Games developer  . Smokeless tobacco: Never Used  . Alcohol Use: No  . Drug Use: No  .  Sexual Activity: Not on file   Other Topics Concern  . Not on file   Social History Narrative    REVIEW OF SYSTEMS: Constitutional: No fevers, chills, or sweats, no generalized fatigue, change in appetite Eyes: No visual changes, double vision, eye pain Ear, nose and throat: No hearing loss, ear pain, nasal congestion, sore throat Cardiovascular: No chest pain, palpitations Respiratory:  No shortness of breath at rest or with exertion, wheezes GastrointestinaI: No nausea, vomiting, diarrhea, abdominal pain, fecal incontinence Genitourinary:  No dysuria, urinary retention or frequency Musculoskeletal:  No neck pain, back pain Integumentary: No rash, pruritus, skin lesions Neurological: as above Psychiatric: No depression, insomnia, anxiety Endocrine: No palpitations, fatigue, diaphoresis, mood swings, change in  appetite, change in weight, increased thirst Hematologic/Lymphatic:  No anemia, purpura, petechiae. Allergic/Immunologic: no itchy/runny eyes, nasal congestion, recent allergic reactions, rashes  PHYSICAL EXAM: Filed Vitals:   06/24/15 1544  BP: 138/90  Pulse: 66   General: No acute distress Head:  Normocephalic/atraumatic Neck: supple, no paraspinal tenderness, full range of motion Heart:  Regular rate and rhythm Lungs:  Clear to auscultation bilaterally Back: No paraspinal tenderness Skin/Extremities: No rash, no edema Neurological Exam: alert and oriented to person, place, and time. No aphasia or dysarthria. Fund of knowledge is appropriate.  Recent and remote memory are intact.  Attention and concentration are normal.    Able to name objects and repeat phrases. Cranial nerves: Pupils equal, round, reactive to light.  Fundoscopic exam unremarkable, no papilledema. Extraocular movements intact with no nystagmus. Visual fields full. Facial sensation intact. No facial asymmetry. Tongue, uvula, palate midline.  Motor: Bulk and tone normal, muscle strength 5/5 throughout with no pronator drift.  Sensation to light touch, temperature and vibration intact.  No extinction to double simultaneous stimulation.  Deep tendon reflexes 2+ throughout, toes downgoing.  Finger to nose testing intact.  Gait narrow-based and steady, able to tandem walk adequately.  Romberg negative.  IMPRESSION: This is a 60 yo LH man with a history of hypertension, hyperlipidemia, anxiety, chronic back pain, with a prolonged hospital admission last September 2016 for septic shock with multi-organ failure, complicated by posterior reversible encephalopathy with symptomatic seizure. His MRI in September had shown typical findings seen in PRES, and his EEG had shown seizures arising from the right temporal region and right frontotemporal epileptiform discharges. He denies any seizures since his hospital stay and wonders about  duration of AED intake. His EEG was normal. 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 He is asymptomatic from this. We discussed that this may be a seizure focus as well. At this point, we can start tapering down Vimpat by 50mg  every week, however he will continue Keppra 1000mg  BID for now. A 24-hour EEG will be ordered once he is on Keppra monotherapy, and we if normal, we may continue tapering down Keppra. He understands risks for breakthrough seizures with any medication adjustment and knows to call our office for any changes in symptoms. He will follow-up after the EEG.  Thank you for allowing me to participate in his care.  Please do not hesitate to call for any questions or concerns.  The duration of this appointment visit was 25 minutes of face-to-face time with the patient.  Greater than 50% of this time was spent in counseling, explanation of diagnosis, planning of further management, and coordination of care.   Patrcia Dolly, M.D.   CC: Marcelline Mates, PA-C

## 2015-06-25 MED FILL — FAMOTIDINE 20 MG TABLET: 20 | 30 days supply | Qty: 60 | Fill #2

## 2015-06-25 MED FILL — LORazepam 1 MG TABS: 1 | 30 days supply | Qty: 60 | Fill #2

## 2015-06-25 MED FILL — CITALOPRAM HBR 40 MG TABLET: 40 | 30 days supply | Qty: 30 | Fill #3

## 2015-06-25 MED FILL — CARVEDILOL 6.25 MG TABLET: 6.25 | 30 days supply | Qty: 60 | Fill #1

## 2015-06-25 MED FILL — AMLODIPINE BESYLATE 10 MG T: 10 | 30 days supply | Qty: 30 | Fill #3

## 2015-06-26 ENCOUNTER — Encounter: Payer: Self-pay | Admitting: Neurology

## 2015-06-30 MED FILL — VIMPAT 100 MG TABLET: 100 | 30 days supply | Qty: 60 | Fill #1

## 2015-07-08 MED FILL — hydrALAZINE HCL 50 MG TABS: 50 | 30 days supply | Qty: 90 | Fill #1

## 2015-07-09 ENCOUNTER — Other Ambulatory Visit (HOSPITAL_COMMUNITY): Payer: Self-pay | Admitting: Physical Medicine and Rehabilitation

## 2015-07-09 ENCOUNTER — Ambulatory Visit (HOSPITAL_COMMUNITY)
Admission: RE | Admit: 2015-07-09 | Discharge: 2015-07-09 | Disposition: A | Discharge status: Home / Self Care | Payer: Medicare Other | Source: Ambulatory Visit | Attending: Physical Medicine and Rehabilitation | Admitting: Physical Medicine and Rehabilitation

## 2015-07-09 DIAGNOSIS — S79912A Unspecified injury of left hip, initial encounter: Secondary | ICD-10-CM | POA: Diagnosis not present

## 2015-07-09 DIAGNOSIS — G8929 Other chronic pain: Secondary | ICD-10-CM | POA: Insufficient documentation

## 2015-07-09 DIAGNOSIS — M25552 Pain in left hip: Secondary | ICD-10-CM

## 2015-07-09 DIAGNOSIS — M545 Low back pain: Secondary | ICD-10-CM | POA: Diagnosis not present

## 2015-07-13 ENCOUNTER — Other Ambulatory Visit: Payer: Self-pay | Admitting: *Deleted

## 2015-07-13 MED ORDER — FLUTICASONE PROPIONATE 50 MCG/ACT NA SUSP: 1.0000 | Freq: Every day | NASAL | Status: DC

## 2015-07-13 MED FILL — FLUTICASONE PROP 50 MCG SPR: 50 | 30 days supply | Qty: 16 | Fill #0

## 2015-07-13 NOTE — Telephone Encounter (Signed)
Rx sent to the pharmacy by e-script.//AB/CMA 

## 2015-07-17 DIAGNOSIS — M961 Postlaminectomy syndrome, not elsewhere classified: Secondary | ICD-10-CM | POA: Diagnosis not present

## 2015-07-17 DIAGNOSIS — G894 Chronic pain syndrome: Secondary | ICD-10-CM | POA: Diagnosis not present

## 2015-07-21 ENCOUNTER — Other Ambulatory Visit: Payer: Self-pay | Admitting: Physician Assistant

## 2015-07-21 ENCOUNTER — Ambulatory Visit: Payer: Medicare Other | Admitting: Physician Assistant

## 2015-07-21 MED FILL — LORazepam 1 MG TABS: 1 | 30 days supply | Qty: 60 | Fill #0

## 2015-07-21 MED FILL — CARVEDILOL 6.25 MG TABLET: 6.25 | 30 days supply | Qty: 60 | Fill #2

## 2015-07-21 MED FILL — CITALOPRAM HBR 40 MG TABLET: 40 | 30 days supply | Qty: 30 | Fill #4

## 2015-07-21 MED FILL — FAMOTIDINE 20 MG TABLET: 20 | 30 days supply | Qty: 60 | Fill #3

## 2015-07-21 NOTE — Telephone Encounter (Signed)
Faxed hardcopy for lorazepam to [facility] pharmacy. 

## 2015-07-21 NOTE — Telephone Encounter (Signed)
Requesting: Lorazepam Contract  None UDS  None Last OV   04/22/15 Last Refill   #60 with 2 refills on 04/22/2015  Please Advise

## 2015-07-22 ENCOUNTER — Emergency Department (HOSPITAL_COMMUNITY): Payer: Medicare Other

## 2015-07-22 ENCOUNTER — Encounter (HOSPITAL_COMMUNITY): Payer: Self-pay | Admitting: Family Medicine

## 2015-07-22 ENCOUNTER — Emergency Department (HOSPITAL_COMMUNITY)
Admission: EM | Admit: 2015-07-22 | Discharge: 2015-07-22 | Disposition: A | Discharge status: Home / Self Care | Payer: Medicare Other | Attending: Emergency Medicine | Admitting: Emergency Medicine

## 2015-07-22 DIAGNOSIS — G8929 Other chronic pain: Secondary | ICD-10-CM | POA: Insufficient documentation

## 2015-07-22 DIAGNOSIS — E669 Obesity, unspecified: Secondary | ICD-10-CM | POA: Insufficient documentation

## 2015-07-22 DIAGNOSIS — Z87448 Personal history of other diseases of urinary system: Secondary | ICD-10-CM | POA: Insufficient documentation

## 2015-07-22 DIAGNOSIS — F32A Depression, unspecified: Secondary | ICD-10-CM

## 2015-07-22 DIAGNOSIS — R079 Chest pain, unspecified: Secondary | ICD-10-CM | POA: Insufficient documentation

## 2015-07-22 DIAGNOSIS — F54 Psychological and behavioral factors associated with disorders or diseases classified elsewhere: Secondary | ICD-10-CM

## 2015-07-22 DIAGNOSIS — E785 Hyperlipidemia, unspecified: Secondary | ICD-10-CM | POA: Diagnosis not present

## 2015-07-22 DIAGNOSIS — R0602 Shortness of breath: Secondary | ICD-10-CM | POA: Insufficient documentation

## 2015-07-22 DIAGNOSIS — F419 Anxiety disorder, unspecified: Secondary | ICD-10-CM | POA: Insufficient documentation

## 2015-07-22 DIAGNOSIS — F329 Major depressive disorder, single episode, unspecified: Secondary | ICD-10-CM | POA: Diagnosis not present

## 2015-07-22 DIAGNOSIS — M549 Dorsalgia, unspecified: Secondary | ICD-10-CM

## 2015-07-22 DIAGNOSIS — M546 Pain in thoracic spine: Secondary | ICD-10-CM | POA: Diagnosis not present

## 2015-07-22 DIAGNOSIS — Z87891 Personal history of nicotine dependence: Secondary | ICD-10-CM | POA: Diagnosis not present

## 2015-07-22 DIAGNOSIS — F418 Other specified anxiety disorders: Secondary | ICD-10-CM | POA: Diagnosis not present

## 2015-07-22 DIAGNOSIS — N182 Chronic kidney disease, stage 2 (mild): Secondary | ICD-10-CM | POA: Diagnosis present

## 2015-07-22 DIAGNOSIS — M5442 Lumbago with sciatica, left side: Secondary | ICD-10-CM

## 2015-07-22 DIAGNOSIS — Z79899 Other long term (current) drug therapy: Secondary | ICD-10-CM | POA: Diagnosis not present

## 2015-07-22 DIAGNOSIS — E7800 Pure hypercholesterolemia, unspecified: Secondary | ICD-10-CM | POA: Insufficient documentation

## 2015-07-22 DIAGNOSIS — M5440 Lumbago with sciatica, unspecified side: Secondary | ICD-10-CM

## 2015-07-22 DIAGNOSIS — H538 Other visual disturbances: Secondary | ICD-10-CM | POA: Diagnosis not present

## 2015-07-22 DIAGNOSIS — Z7951 Long term (current) use of inhaled steroids: Secondary | ICD-10-CM | POA: Insufficient documentation

## 2015-07-22 DIAGNOSIS — Z7982 Long term (current) use of aspirin: Secondary | ICD-10-CM | POA: Insufficient documentation

## 2015-07-22 DIAGNOSIS — I700 Atherosclerosis of aorta: Secondary | ICD-10-CM | POA: Diagnosis not present

## 2015-07-22 DIAGNOSIS — M545 Low back pain: Secondary | ICD-10-CM | POA: Diagnosis not present

## 2015-07-22 DIAGNOSIS — E782 Mixed hyperlipidemia: Secondary | ICD-10-CM | POA: Diagnosis present

## 2015-07-22 LAB — BASIC METABOLIC PANEL
ANION GAP: 14 (ref 5–15)
BUN: 8 mg/dL (ref 6–20)
CHLORIDE: 110 mmol/L (ref 101–111)
CO2: 19 mmol/L — AB (ref 22–32)
Calcium: 10.7 mg/dL — ABNORMAL HIGH (ref 8.9–10.3)
Creatinine, Ser: 1.4 mg/dL — ABNORMAL HIGH (ref 0.61–1.24)
GFR calc non Af Amer: 52 mL/min — ABNORMAL LOW (ref >60)
Glucose, Bld: 119 mg/dL — ABNORMAL HIGH (ref 65–99)
Potassium: 3 mmol/L — ABNORMAL LOW (ref 3.5–5.1)
Sodium: 143 mmol/L (ref 135–145)

## 2015-07-22 LAB — HEPATIC FUNCTION PANEL
ALT: 19 U/L (ref 17–63)
AST: 27 U/L (ref 15–41)
Albumin: 4.1 g/dL (ref 3.5–5.0)
Alkaline Phosphatase: 53 U/L (ref 38–126)
BILIRUBIN DIRECT: 0.2 mg/dL (ref 0.1–0.5)
BILIRUBIN INDIRECT: 0.4 mg/dL (ref 0.3–0.9)
TOTAL PROTEIN: 6.9 g/dL (ref 6.5–8.1)
Total Bilirubin: 0.6 mg/dL (ref 0.3–1.2)

## 2015-07-22 LAB — I-STAT TROPONIN, ED
Troponin i, poc: 0.05 ng/mL (ref 0.00–0.08)
Troponin i, poc: 0.04 ng/mL (ref 0.00–0.08)

## 2015-07-22 LAB — CBC
HCT: 38.9 % — ABNORMAL LOW (ref 39.0–52.0)
HEMOGLOBIN: 13.7 g/dL (ref 13.0–17.0)
MCH: 29.9 pg (ref 26.0–34.0)
MCHC: 35.2 g/dL (ref 30.0–36.0)
MCV: 84.9 fL (ref 78.0–100.0)
Platelets: 301 10*3/uL (ref 150–400)
RBC: 4.58 MIL/uL (ref 4.22–5.81)
RDW: 12.8 % (ref 11.5–15.5)
WBC: 8.8 10*3/uL (ref 4.0–10.5)

## 2015-07-22 LAB — LIPASE, BLOOD: LIPASE: 72 U/L — AB (ref 11–51)

## 2015-07-22 LAB — RAPID URINE DRUG SCREEN, HOSP PERFORMED
Amphetamines: NOT DETECTED
BARBITURATES: NOT DETECTED
BENZODIAZEPINES: POSITIVE — AB
COCAINE: NOT DETECTED
OPIATES: NOT DETECTED
Tetrahydrocannabinol: NOT DETECTED

## 2015-07-22 MED ORDER — OXYCODONE HCL 5 MG PO TABS
30.0000 mg | ORAL_TABLET | Freq: Once | ORAL | Status: AC
  Administered 2015-07-22: 30 mg via ORAL
  Filled 2015-07-22: qty 6

## 2015-07-22 MED ORDER — POTASSIUM CHLORIDE 10 MEQ/100ML IV SOLN
10.0000 meq | Freq: Once | INTRAVENOUS | Status: AC
  Administered 2015-07-22: 10 meq via INTRAVENOUS
  Filled 2015-07-22: qty 100

## 2015-07-22 MED ORDER — HYDROMORPHONE HCL 1 MG/ML IJ SOLN
0.5000 mg | Freq: Once | INTRAMUSCULAR | Status: AC
  Administered 2015-07-22: 0.5 mg via INTRAVENOUS
  Filled 2015-07-22: qty 1

## 2015-07-22 MED ORDER — DEXAMETHASONE SODIUM PHOSPHATE 10 MG/ML IJ SOLN
10.0000 mg | Freq: Once | INTRAMUSCULAR | Status: AC
  Administered 2015-07-22: 10 mg via INTRAVENOUS
  Filled 2015-07-22: qty 1

## 2015-07-22 MED ORDER — DIAZEPAM 5 MG/ML IJ SOLN
5.0000 mg | Freq: Once | INTRAMUSCULAR | Status: AC
  Administered 2015-07-22: 5 mg via INTRAVENOUS
  Filled 2015-07-22: qty 2

## 2015-07-22 MED ORDER — IOHEXOL 350 MG/ML SOLN
100.0000 mL | Freq: Once | INTRAVENOUS | Status: AC | PRN
  Administered 2015-07-22: 100 mL via INTRAVENOUS

## 2015-07-22 MED ORDER — METHOCARBAMOL 500 MG PO TABS: 500.0000 mg | ORAL_TABLET | Freq: Two times a day (BID) | ORAL | Status: DC

## 2015-07-22 MED FILL — METHOCARBAMOL 500 MG TABLET: 500 | 10 days supply | Qty: 20 | Fill #0

## 2015-07-22 NOTE — Discharge Instructions (Signed)

## 2015-07-22 NOTE — ED Notes (Signed)
Pt here for SOB, back pain, chest pain. Pt panting in triage. sts he is cold.

## 2015-07-22 NOTE — ED Provider Notes (Signed)
CSN: [id number]     Arrival date & time 07/22/15  1050 History   First MD Initiated Contact with Patient 07/22/15 1146     Chief Complaint  Patient presents with  . Shortness of Breath    HPI    60 year old male presents today with complaints of back pain, chest pain, shortness of breath.. Patient reports chronic back pain, symptoms began to worsen over the last 3 days. He reports yesterday he had left anterior chest pain with radiation to shoulders and neck, shortness of breath, blurred vision. Wife states that patient did not want to come to the hospital, symptoms continued to persist throughout the night and morning and she requested he be seen. At the time of evaluation patient reports that the chest pain has resolved, but continues to endorse back pain and shortness of breath. Patient also reports that he is having abdominal pain localized over his old abdominal incision wounds, this made worse with palpation or bending. Wife notes patient has had a cough over the last week, not significantly productive, no social shortness of breath until today. Patient reports he takes oxycodone for his chronic back pain, but did not take his medication today.  Patient has a significant past medical history of hypertension hyperlipidemia, chronic back pain, anxiety who was recently admitted to the hospital on August 2016. Patient was unresponsive and found by his wife on 01/08/2015. He was given Narcan BiPAP but remained cyanotic and hypotensive. He had preceding chest and abdominal pain prior to this episode. He was intubated, went into PEA arrest. Emergent exploratory laparotomy did not show any signs of intra-abdominal cause of acute decompensation. Patient had a cardiac cath at that time that was negative, and was noted to the ICU for acute respiratory failure, septic shock, acute renal failure requiring dialysis. To being transferred for patient had a seizure and resulting respiratory arrest requiring  intubation again. She was evaluated by neurology at that time, with brain MRI and EEG discharged home on Keppra.   Patient was most recent recently seen by neurology on 06/24/2015, no seizure-like activity.  Patient was also seen in the ED on 05/22/2015 for back pain, this was similar to today's complaints of back pain. While being discharge patient became belligerent, using profanity and yelling at provider's coating only receiving 10 mg of oxycodone".     Past Medical History  Diagnosis Date  . Chest pain   . HTN (hypertension)   . Hyperlipemia   . DJD (degenerative joint disease)   . BPH (benign prostatic hypertrophy)   . Obesity   . OSA (obstructive sleep apnea)   . Hypercholesteremia   . Chronic back pain   . Anxiety   . Depression    Past Surgical History  Procedure Laterality Date  . Back surgery      Lspine w/Hardware  . Prostatectomy    . Cervical spine surgery      w/Hardware  . Laparotomy N/A 01/08/2015    Procedure: EXPLORATORY LAPAROTOMY;  Surgeon: Manus Rudd, MD;  Location: [facility] OR;  Service: General;  Laterality: N/A;  . Application of wound vac N/A 01/08/2015    Procedure: APPLICATION OF WOUND VAC;  Surgeon: Manus Rudd, MD;  Location: [facility] OR;  Service: General;  Laterality: N/A;  . Wound debridement N/A 01/11/2015    Procedure: DEBRIDEMENT WOUND AND VAC DRESSING CHANGE;  Surgeon: Manus Rudd, MD;  Location: [facility] OR;  Service: General;  Laterality: N/A;  . Cardiac catheterization N/A 01/13/2015    Procedure:  Left Heart Cath and Coronary Angiography;  Surgeon: Corky Crafts, MD;  Location: [facility] INVASIVE CV LAB;  Service: Cardiovascular;  Laterality: N/A;   Family History  Problem Relation Age of Onset  . Hypertension Mother   . Hypertension Father    Social History  Substance Use Topics  . Smoking status: Former Games developer  . Smokeless tobacco: Never Used  . Alcohol Use: No    Review of Systems  All other systems reviewed and are  negative.   Allergies  Morphine and related; Montelukast; Neurontin; and Wellbutrin  Home Medications   Prior to Admission medications   Medication Sig Start Date End Date Taking? Authorizing Provider  amLODipine (NORVASC) 10 MG tablet Take 1 tablet (10 mg total) by mouth daily. 03/30/15   Waldon Merl, PA-C  aspirin 81 MG tablet Take 81 mg by mouth daily.    Historical Provider, MD  carvedilol (COREG) 6.25 MG tablet Take 1 tablet (6.25 mg total) by mouth 2 (two) times daily with a meal. 05/28/15   Bradd Canary, MD  Cholecalciferol (VITAMIN D-3) 1000 UNITS CAPS Take 2,000 Units by mouth daily.    Historical Provider, MD  citalopram (CELEXA) 40 MG tablet TAKE 1 TABLET BY MOUTH EVERY DAY FOR MOOD 03/20/15   Waldon Merl, PA-C  famotidine (PEPCID) 20 MG tablet TAKE 1 TABLET (20 MG TOTAL) BY MOUTH 2 (TWO) TIMES DAILY. 04/24/15   Waldon Merl, PA-C  fluticasone (FLONASE) 50 MCG/ACT nasal spray Place 1 spray into both nostrils daily. 07/13/15 11/03/15  Waldon Merl, PA-C  hydrALAZINE (APRESOLINE) 50 MG tablet TAKE 1 TABLET BY MOUTH EVERY 8 HOURS 06/10/15   Waldon Merl, PA-C  Lacosamide 100 MG TABS Take 1 tablet (100 mg total) by mouth 2 (two) times daily. 06/24/15   Van Clines, MD  levETIRAcetam (KEPPRA) 1000 MG tablet Take 1 tablet (1,000 mg total) by mouth 2 (two) times daily. 06/24/15   Van Clines, MD  LORazepam (ATIVAN) 1 MG tablet TAKE 1 TABLET BY MOUTH TWICE DAILY 07/21/15   Waldon Merl, PA-C  methocarbamol (ROBAXIN) 500 MG tablet Take 1 tablet (500 mg total) by mouth 2 (two) times daily. 07/22/15   Eyvonne Mechanic, PA-C  Multiple Vitamin (MULTIVITAMIN WITH MINERALS) TABS tablet Take 1 tablet by mouth daily. 02/11/15   Bernadene Person, NP  naproxen sodium (ANAPROX) 220 MG tablet Take 660 mg by mouth 2 (two) times daily as needed (pain.).    Historical Provider, MD  nebivolol (BYSTOLIC) 5 MG tablet Take 1 tablet (5 mg total) by mouth daily. 03/20/15   Waldon Merl,  PA-C  Omega-3 Fatty Acids (FISH OIL BURP-LESS PO) Take 1 tablet by mouth daily.      Historical Provider, MD  oxycodone (ROXICODONE) 30 MG immediate release tablet Take 15-30 mg by mouth. Take 1 tablet 4 times daily 06/24/15 07/24/15  Historical Provider, MD   BP 142/86 mmHg  Pulse 64  Temp(Src) 98.1 F (36.7 C) (Oral)  Resp 17  SpO2 100%   Physical Exam  Constitutional: He is oriented to person, place, and time. He appears well-developed and well-nourished.  HENT:  Head: Normocephalic and atraumatic.  Eyes: Conjunctivae are normal. Pupils are equal, round, and reactive to light. Right eye exhibits no discharge. Left eye exhibits no discharge. No scleral icterus.  Neck: Normal range of motion. No JVD present. No tracheal deviation present.  Cardiovascular: Normal rate, regular rhythm and intact distal pulses.  Exam reveals no gallop  and no friction rub.   No murmur heard. Pedal pulses 2+, equal bilateral   Pulmonary/Chest: Effort normal and breath sounds normal. No stridor. No respiratory distress. He has no wheezes. He has no rales. He exhibits no tenderness.  Abdominal: Soft. Bowel sounds are normal. He exhibits no distension and no mass. There is tenderness. There is no rebound and no guarding.  Ventral surgical incision scar clean without signs of infection  Musculoskeletal: Normal range of motion. He exhibits no edema or tenderness.  TTP of right upper scapular region and lower back   Neurological: He is alert and oriented to person, place, and time. Coordination normal.  Skin: Skin is warm. No rash noted. No erythema. No pallor.  Psychiatric: He has a normal mood and affect. His behavior is normal. Judgment and thought content normal.  Nursing note and vitals reviewed.   ED Course  Procedures (including critical care time) Labs Review Labs Reviewed  BASIC METABOLIC PANEL - Abnormal; Notable for the following:    Potassium 3.0 (*)    CO2 19 (*)    Glucose, Bld 119 (*)     Creatinine, Ser 1.40 (*)    Calcium 10.7 (*)    GFR calc non Af Amer 52 (*)    All other components within normal limits  CBC - Abnormal; Notable for the following:    HCT 38.9 (*)    All other components within normal limits  LIPASE, BLOOD - Abnormal; Notable for the following:    Lipase 72 (*)    All other components within normal limits  URINE RAPID DRUG SCREEN, HOSP PERFORMED - Abnormal; Notable for the following:    Benzodiazepines POSITIVE (*)    All other components within normal limits  HEPATIC FUNCTION PANEL  I-STAT TROPOININ, ED  I-STAT TROPOININ, ED    Imaging Review Ct Head Wo Contrast  07/22/2015  CLINICAL DATA:  Frontal headache last night.  Blurred vision EXAM: CT HEAD WITHOUT CONTRAST TECHNIQUE: Contiguous axial images were obtained from the base of the skull through the vertex without intravenous contrast. COMPARISON:  MRI 06/08/2015 FINDINGS: Area of old infarct and encephalomalacia within the right occipital lobe. No acute intracranial abnormality. Specifically, no hemorrhage, hydrocephalus, mass lesion, acute infarction, or significant intracranial injury. No acute calvarial abnormality. Visualized paranasal sinuses and mastoids clear. Orbital soft tissues unremarkable. IMPRESSION: Old right occipital infarct.  No acute intracranial abnormality. Electronically Signed   By: Charlett Nose M.D.   On: 07/22/2015 12:30   Ct Angio Abdomen W/cm &/or Wo Contrast  07/22/2015  CLINICAL DATA:  Severe upper back pain, with hx of chronic lower back pain; hx of cardiac arrest 6 mos. Ago. R/o dissection EXAM: CT ANGIOGRAPHY CHEST, ABDOMEN TECHNIQUE: Multidetector CT imaging through the chest, abdomenwas performed using the standard protocol during bolus administration of intravenous contrast. Multiplanar reconstructed images and MIPs were obtained and reviewed to evaluate the vascular anatomy. CONTRAST:  OMNIPAQUE IOHEXOL 350 MG/ML SOLN COMPARISON:  None. FINDINGS: CTA CHEST FINDINGS  Mediastinum/Nodes: Non IV contrast images demonstrate no intramural hematoma within the thoracic aorta. Contrast-enhanced images demonstrate no dissection or aneurysm of the thoracic aorta. No mediastinal hematoma. The ascending aorta measures 34 mm in the descending thoracic aorta measures 26 mm. Great vessels are normal. No central pulmonary embolism. No pericardial fluid. No mediastinal lymphadenopathy. Esophagus normal. No axillary or supraclavicular adenopathy. Lungs/Pleura: No pulmonary nodularity. No pulmonary infarction or pleural fluid. No infiltrate or pneumothorax. Musculoskeletal: No aggressive osseous lesion. Review of the MIP images  confirms the above findings. CTA ABDOMEN FINDINGS Hepatobiliary: Low-attenuation lesion in the subcapsular RIGHT hepatic lobe likely represent benign hepatic cyst. No duct dilatation. Gallbladder is collapsed. Pancreas: Normal pancreatic parenchymal intensity. No ductal dilatation or inflammation. Spleen: Normal spleen. Adrenals/urinary tract: Adrenal glands and kidneys are normal. Stomach/Bowel: Stomach and limited of the small bowel is unremarkable Vascular/Lymphatic: Abdominal aorta is normal caliber. No evidence of dissection or aneurysm. There scattered intimal calcifications. The celiac trunk, SMA and IMA are widely patent. Bilateral single patent renal arteries. Iliac arteries are normal. Musculoskeletal: No aggressive osseous lesion Region of fat inflammation in the ventral LEFT the peritoneal space measuring 6.5 x 2.8 cm. A similar region of the fat inflammation ventrally in the epigastric region measures 4.3 cm on image 140, series 6. This presumably relates to a fat infarction or inflammation from prior surgery. Review of the MIP images confirms the above findings. IMPRESSION: Chest Impression: 1. No aortic dissection or aneurysm. Abdomen / Pelvis Impression: 1. No abdominal aortic dissection or aneurysm. 2. Mild atherosclerotic calcification aorta. 3. Two  regions of fat inflammation in the ventral peritoneal space presumably related to prior abdominal surgery. Electronically Signed   By: Genevive Bi M.D.   On: 07/22/2015 13:12   Dg Chest Portable 1 View  07/22/2015  CLINICAL DATA:  Weakness with shortness breath and back pain EXAM: PORTABLE CHEST 1 VIEW COMPARISON:  Chest CT from earlier today FINDINGS: The heart size and mediastinal contours are within normal limits for technique. Both lungs are clear. No visible effusion or pneumothorax. The visualized skeletal structures are unremarkable. IMPRESSION: No active disease. Electronically Signed   By: Marnee Spring M.D.   On: 07/22/2015 13:22   Ct Angio Chest Aorta W/cm &/or Wo/cm  07/22/2015  CLINICAL DATA:  Severe upper back pain, with hx of chronic lower back pain; hx of cardiac arrest 6 mos. Ago. R/o dissection EXAM: CT ANGIOGRAPHY CHEST, ABDOMEN TECHNIQUE: Multidetector CT imaging through the chest, abdomenwas performed using the standard protocol during bolus administration of intravenous contrast. Multiplanar reconstructed images and MIPs were obtained and reviewed to evaluate the vascular anatomy. CONTRAST:  OMNIPAQUE IOHEXOL 350 MG/ML SOLN COMPARISON:  None. FINDINGS: CTA CHEST FINDINGS Mediastinum/Nodes: Non IV contrast images demonstrate no intramural hematoma within the thoracic aorta. Contrast-enhanced images demonstrate no dissection or aneurysm of the thoracic aorta. No mediastinal hematoma. The ascending aorta measures 34 mm in the descending thoracic aorta measures 26 mm. Great vessels are normal. No central pulmonary embolism. No pericardial fluid. No mediastinal lymphadenopathy. Esophagus normal. No axillary or supraclavicular adenopathy. Lungs/Pleura: No pulmonary nodularity. No pulmonary infarction or pleural fluid. No infiltrate or pneumothorax. Musculoskeletal: No aggressive osseous lesion. Review of the MIP images confirms the above findings. CTA ABDOMEN FINDINGS Hepatobiliary:  Low-attenuation lesion in the subcapsular RIGHT hepatic lobe likely represent benign hepatic cyst. No duct dilatation. Gallbladder is collapsed. Pancreas: Normal pancreatic parenchymal intensity. No ductal dilatation or inflammation. Spleen: Normal spleen. Adrenals/urinary tract: Adrenal glands and kidneys are normal. Stomach/Bowel: Stomach and limited of the small bowel is unremarkable Vascular/Lymphatic: Abdominal aorta is normal caliber. No evidence of dissection or aneurysm. There scattered intimal calcifications. The celiac trunk, SMA and IMA are widely patent. Bilateral single patent renal arteries. Iliac arteries are normal. Musculoskeletal: No aggressive osseous lesion Region of fat inflammation in the ventral LEFT the peritoneal space measuring 6.5 x 2.8 cm. A similar region of the fat inflammation ventrally in the epigastric region measures 4.3 cm on image 140, series 6. This presumably relates  to a fat infarction or inflammation from prior surgery. Review of the MIP images confirms the above findings. IMPRESSION: Chest Impression: 1. No aortic dissection or aneurysm. Abdomen / Pelvis Impression: 1. No abdominal aortic dissection or aneurysm. 2. Mild atherosclerotic calcification aorta. 3. Two regions of fat inflammation in the ventral peritoneal space presumably related to prior abdominal surgery. Electronically Signed   By: Genevive Bi M.D.   On: 07/22/2015 13:12   I have personally reviewed and evaluated these images and lab results as part of my medical decision-making.   EKG Interpretation   Date/Time:  Wednesday July 22 2015 13:47:12 EST Ventricular Rate:  68 PR Interval:  134 QRS Duration: 102 QT Interval:  510 QTC Calculation: 542 R Axis:   22 Text Interpretation:  Sinus rhythm Ventricular premature complex  Borderline repolarization abnormality Prolonged QT interval no STEMI per  Dr. [place], subtle changes lead III  Confirmed by YAO  MD, DAVID ([id number])  on 07/22/2015 1:58:10  PM      MDM   Final diagnoses:  Back pain, unspecified location  Anxiety    Labs:Urine rapid drug screen, i-STAT troponin, BMP, CBC, hepatic, lipase   Imaging: DG chest portable one view, CT angiogram chest, CT angiogram abdomen, CT head without- no acute findings  Consults: Dr. [place], Dr. Margot Ables   Therapeutics: Decadron, Valium, Dilaudid, oxycodone, potassium  Discharge Meds: Robaxin  Assessment/Plan: 60 year old male presents today with numerous complaints. Patient initially presented with severe back pain, shortness of breath. Patient was in acute distress, concern for aortic dissection. Patient has CT angiogram chest and abdomen which showed no significant findings, he also had a CT head without contrast. Patient had cardiac enzymes here in the ED that showed no significant elevation. Dr. [place] of cardiology was consult and who personally reviewed the EKG and had no acute concerns for STEMI. Patient improved throughout his stay in the ED, I consult at hospitalist service for potential hospital admission. Dr. Margot Ables evaluated the patient here in the ED, patient was agreeable to outpatient management as symptoms have dramatically improved, he had no active chest pain, shortness of breath, and only complained of his chronic back pain. Patient's shortness of breath likely has a anxiety component to this, no signs of acute infectious etiology, signs or symptoms of PE.  Both patient and his wife were in agreement to following up outpatient for further evaluation and management. Patient had complaints of discomfort over his incisional scar, he will be referred to [facility] surgery for suture removal. Patient is in pain management for his chronic back pain, he will not be given any pain medications at the time of discharge. The patient was given strict return precautions, both him and his wife verbalized understanding and agreement for today's plan and had no further questions or concerns  at time of discharge.          Eyvonne Mechanic, PA-C 07/22/15 1553  Richardean Canal, MD 07/22/15 [phone]

## 2015-07-22 NOTE — Consult Note (Signed)
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 Reproducible on mild palpation of chest wall. Patient very anxious. Heart score 2. EKG without evidence of ACS. Troponins negative 2. Patient to continue current regimen and manage his anxiety as discussed below  Lumbar pain with left-sided sciatica: Chronic and ongoing issue. Patient currently receiving therapy through pain management clinic. States that his previous orthopedic surgeon when I see him anymore as he is nonoperative. This seems to be worsening of his issue but patient without any evidence of cord compression as he is without symptoms such as saddle anesthesia or loss of bowel or bladder function or inability to ambulate. At this time I will order for patient to have a dose of Decadron 10 mg, Valium 5 mg, and Dilaudid 0.5 mg. Decadron last venous system approximately 54 hours. Patient with home benzodiazepines that he can continue. Would recommend that patient be discharged with Robaxin 50-1000 mg 3 times a day when necessary for back pain. Recommend patient follow-up with his primary care physician for physical therapy or with sports medicine for another orthopedic surgeon for further management of this problem.  Numerous vague somatic complaints: Suspect these are related to patient's significant anxiety and hyperventilation. Counseled patient to manage his stress with slow deep breathing, take his medications as prescribed, and seek further medical attention for his  chronic medical conditions. Symptoms come and go there is no reliable persistent symptoms suggesting underlying significant pathology requiring further workup.  Abdominal wall pain: Likely secondary to surgical laparotomy performed in August 2016. Sutures present with small amount coming through the skin just inferior to the umbilicus. Recommending patient follow-up with general surgery for removal of the sutures and further management of his wound. No evidence of cellulitis or other acute infection.  Anxiety/depression: Significant history of these problems as outlined by the patient. Recommending he continue his current regimen and seek further medical attention from his primary care physician or psychiatry.   Chief Complaint: Lumbar pain with numbness of fingers and toes bilaterally.  HPI:  Patient with numerous complaints and has a very hard time putting together a coherent story of complaints. Patient with multiple medical problems which have arisen ever since he developed PEA, septic shockin August 2016 requiring emergent exploratory laparotomy.  Currently patient finding of approximate 4 day history of lumbar back pain which is bilateral but predominantly on the left with small amount of radiation down the left leg. Pain is somewhat constant. Waxing and waning nature. Worsens with certain movements. Denies any saddle anesthesia, loss of bowel or bladder function, or falls associated with leg weakness. States that his feet are intermittently numb. This typically worsens as he becomes more anxious and worried. Patient states that he is also had some intermittent chest pain over the last day which does not relate to exertion. Patient states he is late in the bed for several days in a row which made his back pain worse. Biofreeze without improvement. Patient is on oxycodone 30 mg 4 times a day which he states he is compliant with. Patient  goes to pain clinic for this treatment as his previous orthopedic  surgeon said his back is inoperable. Denies any other changes to his medications. Uses Tylenol for breakthrough pain. Patient also complaining of incisional pain predominantly where the sutures are. States that the sutures brought through the skin from time to time and he trimmed his back with scissors her finger clippers. Patient also states that when he gets very worried and anxious he also gets nauseous. Denies any fevers, productive cough, dysuria, rash, headache.  Review of Systems:  Per history of present illness with all other systems negative  Past Medical History  Diagnosis Date  . Chest pain   . HTN (hypertension)   . Hyperlipemia   . DJD (degenerative joint disease)   . BPH (benign prostatic hypertrophy)   . Obesity   . OSA (obstructive sleep apnea)   . Hypercholesteremia   . Chronic back pain   . Anxiety   . Depression    Past Surgical History  Procedure Laterality Date  . Back surgery      Lspine w/Hardware  . Prostatectomy    . Cervical spine surgery      w/Hardware  . Laparotomy N/A 01/08/2015    Procedure: EXPLORATORY LAPAROTOMY;  Surgeon: Manus Rudd, MD;  Location: [facility] OR;  Service: General;  Laterality: N/A;  . Application of wound vac N/A 01/08/2015    Procedure: APPLICATION OF WOUND VAC;  Surgeon: Manus Rudd, MD;  Location: [facility] OR;  Service: General;  Laterality: N/A;  . Wound debridement N/A 01/11/2015    Procedure: DEBRIDEMENT WOUND AND VAC DRESSING CHANGE;  Surgeon: Manus Rudd, MD;  Location: [facility] OR;  Service: General;  Laterality: N/A;  . Cardiac catheterization N/A 01/13/2015    Procedure: Left Heart Cath and Coronary Angiography;  Surgeon: Corky Crafts, MD;  Location: [facility] INVASIVE CV LAB;  Service: Cardiovascular;  Laterality: N/A;   Social History:  reports that he has quit smoking. He has never used smokeless tobacco. He reports that he does not drink alcohol or use illicit drugs.  Allergies  Allergen Reactions  . Morphine And Related  Anaphylaxis and Shortness Of Breath  . Montelukast Other (See Comments)    unknown  . Neurontin [Gabapentin] Other (See Comments)    delusional  . Wellbutrin [Bupropion] Palpitations    Insomnia   Family History  Problem Relation Age of Onset  . Hypertension Mother   . Hypertension Father     Prior to Admission medications   Medication Sig Start Date End Date Taking? Authorizing Provider  amLODipine (NORVASC) 10 MG tablet Take 1 tablet (10 mg total) by mouth daily. 03/30/15   Waldon Merl, PA-C  aspirin 81 MG tablet Take 81 mg by mouth daily.    Historical Provider, MD  carvedilol (COREG) 6.25 MG tablet Take 1 tablet (6.25 mg total) by mouth 2 (two) times daily with a meal. 05/28/15   Bradd Canary, MD  Cholecalciferol (VITAMIN D-3) 1000 UNITS CAPS Take 2,000 Units by mouth daily.    Historical Provider, MD  citalopram (CELEXA) 40 MG tablet TAKE 1 TABLET BY MOUTH EVERY DAY FOR MOOD 03/20/15   Waldon Merl, PA-C  famotidine (PEPCID) 20 MG tablet TAKE 1 TABLET (20 MG TOTAL) BY MOUTH 2 (TWO) TIMES DAILY. 04/24/15   Waldon Merl, PA-C  fluticasone (FLONASE) 50 MCG/ACT nasal spray Place 1 spray into both nostrils daily. 07/13/15 11/03/15  Waldon Merl, PA-C  hydrALAZINE (APRESOLINE) 50 MG tablet TAKE 1 TABLET BY  MOUTH EVERY 8 HOURS 06/10/15   Waldon Merl, PA-C  Lacosamide 100 MG TABS Take 1 tablet (100 mg total) by mouth 2 (two) times daily. 06/24/15   Van Clines, MD  levETIRAcetam (KEPPRA) 1000 MG tablet Take 1 tablet (1,000 mg total) by mouth 2 (two) times daily. 06/24/15   Van Clines, MD  LORazepam (ATIVAN) 1 MG tablet TAKE 1 TABLET BY MOUTH TWICE DAILY 07/21/15   Waldon Merl, PA-C  Multiple Vitamin (MULTIVITAMIN WITH MINERALS) TABS tablet Take 1 tablet by mouth daily. 02/11/15   Bernadene Person, NP  naproxen sodium (ANAPROX) 220 MG tablet Take 660 mg by mouth 2 (two) times daily as needed (pain.).    Historical Provider, MD  nebivolol (BYSTOLIC) 5 MG tablet Take  1 tablet (5 mg total) by mouth daily. 03/20/15   Waldon Merl, PA-C  Omega-3 Fatty Acids (FISH OIL BURP-LESS PO) Take 1 tablet by mouth daily.      Historical Provider, MD  oxycodone (ROXICODONE) 30 MG immediate release tablet Take 15-30 mg by mouth. Take 1 tablet 4 times daily 06/24/15 07/24/15  Historical Provider, MD   Physical Exam: Blood pressure 142/86, pulse 64, temperature 98.1 F (36.7 C), temperature source Oral, resp. rate 17, SpO2 100 %. Filed Vitals:   07/22/15 1515 07/22/15 1530  BP: 147/74 142/86  Pulse: 72 64  Temp:    Resp: 14 17    Physical Exam  Constitutional: oriented to person, place, and time. appears well-developed and well-nourished.   HENT:  Head: Normocephalic and atraumatic.  Eyes: EOMI. PERRL.  Neck: Normal range of motion.  Cardiovascular: RRR, no m/r/g, 2+ distal pulses, palpation of left chest wall produces mild chest pain patient states is similar to his previous chest pain experienced. Pulmonary/Chest: Effort normal and breath sounds normal. No respiratory distress.  Abdominal: Soft. Bowel sounds are normal. NonTTP, no distension.  lower abdominal laparotomy scar present with several nylon sutures felt just below the surface of the skin with single suture protruding through the skin just distal to the umbilicus. Skin is well healed with significant scar tissue formation without evidence of induration, erythema, fluctuance, or tenderness to palpation.  Neurological: alert and oriented to person, place, and time.  Skin: Skin is warm. No rash noted. non diaphoretic.  Psychiatric: Patient is anxious and mood is somewhat labile though thought process and response to questioning is appropriate.    Labs on Admission:  Basic Metabolic Panel:  Recent Labs Lab 07/22/15 1058  NA 143  K 3.0*  CL 110  CO2 19*  GLUCOSE 119*  BUN 8  CREATININE 1.40*  CALCIUM 10.7*   Liver Function Tests:  Recent Labs Lab 07/22/15 1058  AST 27  ALT 19  ALKPHOS 53   BILITOT 0.6  PROT 6.9  ALBUMIN 4.1    Recent Labs Lab 07/22/15 1058  LIPASE 72*   No results for input(s): AMMONIA in the last 168 hours. CBC:  Recent Labs Lab 07/22/15 1058  WBC 8.8  HGB 13.7  HCT 38.9*  MCV 84.9  PLT 301   Cardiac Enzymes: No results for input(s): CKTOTAL, CKMB, CKMBINDEX, TROPONINI in the last 168 hours. BNP: Invalid input(s): POCBNP CBG: No results for input(s): GLUCAP in the last 168 hours.  Radiological Exams on Admission: Ct Head Wo Contrast  07/22/2015  CLINICAL DATA:  Frontal headache last night.  Blurred vision EXAM: CT HEAD WITHOUT CONTRAST TECHNIQUE: Contiguous axial images were obtained from the base of the skull through  the vertex without intravenous contrast. COMPARISON:  MRI 06/08/2015 FINDINGS: Area of old infarct and encephalomalacia within the right occipital lobe. No acute intracranial abnormality. Specifically, no hemorrhage, hydrocephalus, mass lesion, acute infarction, or significant intracranial injury. No acute calvarial abnormality. Visualized paranasal sinuses and mastoids clear. Orbital soft tissues unremarkable. IMPRESSION: Old right occipital infarct.  No acute intracranial abnormality. Electronically Signed   By: Charlett Nose M.D.   On: 07/22/2015 12:30   Ct Angio Abdomen W/cm &/or Wo Contrast  07/22/2015  CLINICAL DATA:  Severe upper back pain, with hx of chronic lower back pain; hx of cardiac arrest 6 mos. Ago. R/o dissection EXAM: CT ANGIOGRAPHY CHEST, ABDOMEN TECHNIQUE: Multidetector CT imaging through the chest, abdomenwas performed using the standard protocol during bolus administration of intravenous contrast. Multiplanar reconstructed images and MIPs were obtained and reviewed to evaluate the vascular anatomy. CONTRAST:  OMNIPAQUE IOHEXOL 350 MG/ML SOLN COMPARISON:  None. FINDINGS: CTA CHEST FINDINGS Mediastinum/Nodes: Non IV contrast images demonstrate no intramural hematoma within the thoracic aorta. Contrast-enhanced  images demonstrate no dissection or aneurysm of the thoracic aorta. No mediastinal hematoma. The ascending aorta measures 34 mm in the descending thoracic aorta measures 26 mm. Great vessels are normal. No central pulmonary embolism. No pericardial fluid. No mediastinal lymphadenopathy. Esophagus normal. No axillary or supraclavicular adenopathy. Lungs/Pleura: No pulmonary nodularity. No pulmonary infarction or pleural fluid. No infiltrate or pneumothorax. Musculoskeletal: No aggressive osseous lesion. Review of the MIP images confirms the above findings. CTA ABDOMEN FINDINGS Hepatobiliary: Low-attenuation lesion in the subcapsular RIGHT hepatic lobe likely represent benign hepatic cyst. No duct dilatation. Gallbladder is collapsed. Pancreas: Normal pancreatic parenchymal intensity. No ductal dilatation or inflammation. Spleen: Normal spleen. Adrenals/urinary tract: Adrenal glands and kidneys are normal. Stomach/Bowel: Stomach and limited of the small bowel is unremarkable Vascular/Lymphatic: Abdominal aorta is normal caliber. No evidence of dissection or aneurysm. There scattered intimal calcifications. The celiac trunk, SMA and IMA are widely patent. Bilateral single patent renal arteries. Iliac arteries are normal. Musculoskeletal: No aggressive osseous lesion Region of fat inflammation in the ventral LEFT the peritoneal space measuring 6.5 x 2.8 cm. A similar region of the fat inflammation ventrally in the epigastric region measures 4.3 cm on image 140, series 6. This presumably relates to a fat infarction or inflammation from prior surgery. Review of the MIP images confirms the above findings. IMPRESSION: Chest Impression: 1. No aortic dissection or aneurysm. Abdomen / Pelvis Impression: 1. No abdominal aortic dissection or aneurysm. 2. Mild atherosclerotic calcification aorta. 3. Two regions of fat inflammation in the ventral peritoneal space presumably related to prior abdominal surgery. Electronically Signed    By: Genevive Bi M.D.   On: 07/22/2015 13:12   Dg Chest Portable 1 View  07/22/2015  CLINICAL DATA:  Weakness with shortness breath and back pain EXAM: PORTABLE CHEST 1 VIEW COMPARISON:  Chest CT from earlier today FINDINGS: The heart size and mediastinal contours are within normal limits for technique. Both lungs are clear. No visible effusion or pneumothorax. The visualized skeletal structures are unremarkable. IMPRESSION: No active disease. Electronically Signed   By: Marnee Spring M.D.   On: 07/22/2015 13:22   Ct Angio Chest Aorta W/cm &/or Wo/cm  07/22/2015  CLINICAL DATA:  Severe upper back pain, with hx of chronic lower back pain; hx of cardiac arrest 6 mos. Ago. R/o dissection EXAM: CT ANGIOGRAPHY CHEST, ABDOMEN TECHNIQUE: Multidetector CT imaging through the chest, abdomenwas performed using the standard protocol during bolus administration of intravenous contrast. Multiplanar reconstructed  images and MIPs were obtained and reviewed to evaluate the vascular anatomy. CONTRAST:  OMNIPAQUE IOHEXOL 350 MG/ML SOLN COMPARISON:  None. FINDINGS: CTA CHEST FINDINGS Mediastinum/Nodes: Non IV contrast images demonstrate no intramural hematoma within the thoracic aorta. Contrast-enhanced images demonstrate no dissection or aneurysm of the thoracic aorta. No mediastinal hematoma. The ascending aorta measures 34 mm in the descending thoracic aorta measures 26 mm. Great vessels are normal. No central pulmonary embolism. No pericardial fluid. No mediastinal lymphadenopathy. Esophagus normal. No axillary or supraclavicular adenopathy. Lungs/Pleura: No pulmonary nodularity. No pulmonary infarction or pleural fluid. No infiltrate or pneumothorax. Musculoskeletal: No aggressive osseous lesion. Review of the MIP images confirms the above findings. CTA ABDOMEN FINDINGS Hepatobiliary: Low-attenuation lesion in the subcapsular RIGHT hepatic lobe likely represent benign hepatic cyst. No duct dilatation.  Gallbladder is collapsed. Pancreas: Normal pancreatic parenchymal intensity. No ductal dilatation or inflammation. Spleen: Normal spleen. Adrenals/urinary tract: Adrenal glands and kidneys are normal. Stomach/Bowel: Stomach and limited of the small bowel is unremarkable Vascular/Lymphatic: Abdominal aorta is normal caliber. No evidence of dissection or aneurysm. There scattered intimal calcifications. The celiac trunk, SMA and IMA are widely patent. Bilateral single patent renal arteries. Iliac arteries are normal. Musculoskeletal: No aggressive osseous lesion Region of fat inflammation in the ventral LEFT the peritoneal space measuring 6.5 x 2.8 cm. A similar region of the fat inflammation ventrally in the epigastric region measures 4.3 cm on image 140, series 6. This presumably relates to a fat infarction or inflammation from prior surgery. Review of the MIP images confirms the above findings. IMPRESSION: Chest Impression: 1. No aortic dissection or aneurysm. Abdomen / Pelvis Impression: 1. No abdominal aortic dissection or aneurysm. 2. Mild atherosclerotic calcification aorta. 3. Two regions of fat inflammation in the ventral peritoneal space presumably related to prior abdominal surgery. Electronically Signed   By: Genevive Bi M.D.   On: 07/22/2015 13:12    EKG: Independently reviewed. Sinus, no evidence of ACS  MERRELL, DAVID J [facility]  If 7PM-7AM, please contact night-coverage [email or web address] Password [facility] 07/22/2015, 3:39 PM

## 2015-07-23 MED FILL — VIMPAT 100 MG TABLET: 100 | 30 days supply | Qty: 60 | Fill #2

## 2015-07-24 ENCOUNTER — Ambulatory Visit (INDEPENDENT_AMBULATORY_CARE_PROVIDER_SITE_OTHER): Payer: Medicare Other | Admitting: Physician Assistant

## 2015-07-24 ENCOUNTER — Encounter: Payer: Self-pay | Admitting: Physician Assistant

## 2015-07-24 VITALS — BP 144/72 | HR 63 | Temp 97.9°F | Ht 76.0 in | Wt 245.4 lb

## 2015-07-24 DIAGNOSIS — F418 Other specified anxiety disorders: Secondary | ICD-10-CM

## 2015-07-24 DIAGNOSIS — F419 Anxiety disorder, unspecified: Principal | ICD-10-CM

## 2015-07-24 DIAGNOSIS — F329 Major depressive disorder, single episode, unspecified: Secondary | ICD-10-CM

## 2015-07-24 MED ORDER — MIRTAZAPINE 15 MG PO TABS: 15.0000 mg | ORAL_TABLET | Freq: Every day | ORAL | Status: DC

## 2015-07-24 MED ORDER — METHOCARBAMOL 500 MG PO TABS: 500.0000 mg | ORAL_TABLET | Freq: Two times a day (BID) | ORAL | Status: DC

## 2015-07-24 MED FILL — METHOCARBAMOL 500 MG TABLET: 500 | 30 days supply | Qty: 60 | Fill #0

## 2015-07-24 MED FILL — MIRTAZAPINE 15 MG TABLET: 15 | 30 days supply | Qty: 30 | Fill #0

## 2015-07-24 NOTE — Patient Instructions (Signed)
Please continue medications as directed. Start the Remeron in the evening as directed -- Take 1/2 tablet nightly. Follow-up with me in 2-3 weeks.  If you notice the medication is making you too sleepy or if you notice any side effect, stop the medication and call me.

## 2015-07-24 NOTE — Progress Notes (Signed)
Pre visit review using our clinic review tool, if applicable. No additional management support is needed unless otherwise documented below in the visit note. 

## 2015-07-24 NOTE — Progress Notes (Signed)
Patient presents to clinic today to discuss management for anxiety. Patient seen in ER on 07/22/15 for atypical pain and with negative workup. Symptoms thought to be related to anxiety. Patient currently on citalopram 40 mg daily and has tolerated very well for some time. Is prescribed Ativan which he takes PRN but recently has needed twice daily. Endorses anxiety stems from worry over health. Endorses generalized anxiety and insomnia.   Past Medical History  Diagnosis Date  . Chest pain   . HTN (hypertension)   . Hyperlipemia   . DJD (degenerative joint disease)   . BPH (benign prostatic hypertrophy)   . Obesity   . OSA (obstructive sleep apnea)   . Hypercholesteremia   . Chronic back pain   . Anxiety   . Depression     Current Outpatient Prescriptions on File Prior to Visit  Medication Sig Dispense Refill  . amLODipine (NORVASC) 10 MG tablet Take 1 tablet (10 mg total) by mouth daily. 30 tablet 5  . aspirin 81 MG tablet Take 81 mg by mouth daily.    . carvedilol (COREG) 6.25 MG tablet Take 1 tablet (6.25 mg total) by mouth 2 (two) times daily with a meal. 180 tablet 0  . Cholecalciferol (VITAMIN D-3) 1000 UNITS CAPS Take 2,000 Units by mouth daily.    . citalopram (CELEXA) 40 MG tablet TAKE 1 TABLET BY MOUTH EVERY DAY FOR MOOD 30 tablet 5  . famotidine (PEPCID) 20 MG tablet TAKE 1 TABLET (20 MG TOTAL) BY MOUTH 2 (TWO) TIMES DAILY. 60 tablet 5  . fluticasone (FLONASE) 50 MCG/ACT nasal spray Place 1 spray into both nostrils daily. 16 g 5  . hydrALAZINE (APRESOLINE) 50 MG tablet TAKE 1 TABLET BY MOUTH EVERY 8 HOURS 90 tablet 3  . Lacosamide 100 MG TABS Take 1 tablet (100 mg total) by mouth 2 (two) times daily. 60 tablet 2  . levETIRAcetam (KEPPRA) 1000 MG tablet Take 1 tablet (1,000 mg total) by mouth 2 (two) times daily. 180 tablet 3  . LORazepam (ATIVAN) 1 MG tablet TAKE 1 TABLET BY MOUTH TWICE DAILY 60 tablet 1  . Multiple Vitamin (MULTIVITAMIN WITH MINERALS) TABS tablet Take 1  tablet by mouth daily.    . naproxen sodium (ANAPROX) 220 MG tablet Take 660 mg by mouth 2 (two) times daily as needed (pain.).    Marland Kitchen Omega-3 Fatty Acids (FISH OIL BURP-LESS PO) Take 1 tablet by mouth daily.       No current facility-administered medications on file prior to visit.    Allergies  Allergen Reactions  . Morphine And Related Anaphylaxis and Shortness Of Breath  . Montelukast Other (See Comments)    unknown  . Neurontin [Gabapentin] Other (See Comments)    delusional  . Wellbutrin [Bupropion] Palpitations    Insomnia    Family History  Problem Relation Age of Onset  . Hypertension Mother   . Hypertension Father     Social History   Social History  . Marital Status: Married    Spouse Name: N/A  . Number of Children: 1  . Years of Education: N/A   Occupational History  . Disabled    Social History Main Topics  . Smoking status: Former Research scientist (life sciences)  . Smokeless tobacco: Never Used  . Alcohol Use: No  . Drug Use: No  . Sexual Activity: Not Asked   Other Topics Concern  . None   Social History Narrative   Review of Systems - See HPI.  All other ROS  are negative.  BP 144/72 mmHg  Pulse 63  Temp(Src) 97.9 F (36.6 C) (Oral)  Ht _0  (1.93 m)  Wt 245 lb 6.4 oz (111.313 kg)  BMI 29.88 kg/m2  SpO2 100%  Physical Exam  Constitutional: He is oriented to person, place, and time and well-developed, well-nourished, and in no distress.  HENT:  Head: Normocephalic and atraumatic.  Eyes: Conjunctivae are normal.  Cardiovascular: Normal rate, regular rhythm, normal heart sounds and intact distal pulses.   Pulmonary/Chest: Effort normal and breath sounds normal. No respiratory distress. He has no wheezes. He has no rales. He exhibits no tenderness.  Neurological: He is alert and oriented to person, place, and time.  Skin: Skin is warm and dry. No rash noted.  Psychiatric: Affect normal.  Vitals reviewed.   Recent Results (from the past 2160 hour(s))  I-STAT  creatinine     Status: Abnormal   Collection Time: 06/08/15  3:49 PM  Result Value Ref Range   Creatinine, Ser 1.30 (H) 0.61 - 1.24 mg/dL  Basic metabolic panel     Status: Abnormal   Collection Time: 07/22/15 10:58 AM  Result Value Ref Range   Sodium 143 135 - 145 mmol/L   Potassium 3.0 (L) 3.5 - 5.1 mmol/L   Chloride 110 101 - 111 mmol/L   CO2 19 (L) 22 - 32 mmol/L   Glucose, Bld 119 (H) 65 - 99 mg/dL   BUN 8 6 - 20 mg/dL   Creatinine, Ser 1.40 (H) 0.61 - 1.24 mg/dL   Calcium 10.7 (H) 8.9 - 10.3 mg/dL   GFR calc non Af Amer 52 (L) >60 mL/min   GFR calc Af Amer >60 >60 mL/min    Comment: (NOTE) The eGFR has been calculated using the CKD EPI equation. This calculation has not been validated in all clinical situations. eGFR's persistently <60 mL/min signify possible Chronic Kidney Disease.    Anion gap 14 5 - 15  CBC     Status: Abnormal   Collection Time: 07/22/15 10:58 AM  Result Value Ref Range   WBC 8.8 4.0 - 10.5 K/uL   RBC 4.58 4.22 - 5.81 MIL/uL   Hemoglobin 13.7 13.0 - 17.0 g/dL   HCT 38.9 (L) 39.0 - 52.0 %   MCV 84.9 78.0 - 100.0 fL   MCH 29.9 26.0 - 34.0 pg   MCHC 35.2 30.0 - 36.0 g/dL   RDW 12.8 11.5 - 15.5 %   Platelets 301 150 - 400 K/uL  Hepatic function panel     Status: None   Collection Time: 07/22/15 10:58 AM  Result Value Ref Range   Total Protein 6.9 6.5 - 8.1 g/dL   Albumin 4.1 3.5 - 5.0 g/dL   AST 27 15 - 41 U/L   ALT 19 17 - 63 U/L   Alkaline Phosphatase 53 38 - 126 U/L   Total Bilirubin 0.6 0.3 - 1.2 mg/dL   Bilirubin, Direct 0.2 0.1 - 0.5 mg/dL   Indirect Bilirubin 0.4 0.3 - 0.9 mg/dL  Lipase, blood     Status: Abnormal   Collection Time: 07/22/15 10:58 AM  Result Value Ref Range   Lipase 72 (H) 11 - 51 U/L  I-stat troponin, ED (not at [facility], [facility])     Status: None   Collection Time: 07/22/15 11:10 AM  Result Value Ref Range   Troponin i, poc 0.05 0.00 - 0.08 ng/mL   Comment 3            Comment: Due  to the release kinetics of cTnI, a  negative result within the first hours of the onset of symptoms does not rule out myocardial infarction with certainty. If myocardial infarction is still suspected, repeat the test at appropriate intervals.   I-stat troponin, ED     Status: None   Collection Time: 07/22/15  2:06 PM  Result Value Ref Range   Troponin i, poc 0.04 0.00 - 0.08 ng/mL   Comment 3            Comment: Due to the release kinetics of cTnI, a negative result within the first hours of the onset of symptoms does not rule out myocardial infarction with certainty. If myocardial infarction is still suspected, repeat the test at appropriate intervals.   Urine rapid drug screen (hosp performed)     Status: Abnormal   Collection Time: 07/22/15  2:14 PM  Result Value Ref Range   Opiates NONE DETECTED NONE DETECTED   Cocaine NONE DETECTED NONE DETECTED   Benzodiazepines POSITIVE (A) NONE DETECTED   Amphetamines NONE DETECTED NONE DETECTED   Tetrahydrocannabinol NONE DETECTED NONE DETECTED   Barbiturates NONE DETECTED NONE DETECTED    Comment:        DRUG SCREEN FOR MEDICAL PURPOSES ONLY.  IF CONFIRMATION IS NEEDED FOR ANY PURPOSE, NOTIFY LAB WITHIN 5 DAYS.        LOWEST DETECTABLE LIMITS FOR URINE DRUG SCREEN Drug Class       Cutoff (ng/mL) Amphetamine      1000 Barbiturate      200 Benzodiazepine   130 Tricyclics       865 Opiates          300 Cocaine          300 THC              50     Assessment/Plan: Anxiety and depression Will continue Citalopram and Ativan. Discussed counseling. Patient to consider. Would like to add-on Seroquel for mood and sleep but is not covered on formulary per EMR. Will add on 7.5 mg Remeron. Follow-up 1 month.

## 2015-07-26 NOTE — Assessment & Plan Note (Signed)
Will continue Citalopram and Ativan. Discussed counseling. Patient to consider. Would like to add-on Seroquel for mood and sleep but is not covered on formulary per EMR. Will add on 7.5 mg Remeron. Follow-up 1 month.

## 2015-07-28 MED FILL — AMLODIPINE BESYLATE 10 MG T: 10 | 30 days supply | Qty: 30 | Fill #4

## 2015-08-06 MED FILL — hydrALAZINE HCL 50 MG TABS: 50 | 30 days supply | Qty: 90 | Fill #2

## 2015-08-11 ENCOUNTER — Ambulatory Visit (INDEPENDENT_AMBULATORY_CARE_PROVIDER_SITE_OTHER): Payer: Medicare Other | Admitting: Physician Assistant

## 2015-08-11 ENCOUNTER — Encounter: Payer: Self-pay | Admitting: Physician Assistant

## 2015-08-11 VITALS — BP 156/60 | HR 67 | Temp 98.2°F | Ht 76.0 in | Wt 240.6 lb

## 2015-08-11 DIAGNOSIS — F32A Depression, unspecified: Secondary | ICD-10-CM

## 2015-08-11 DIAGNOSIS — F418 Other specified anxiety disorders: Secondary | ICD-10-CM

## 2015-08-11 DIAGNOSIS — F419 Anxiety disorder, unspecified: Principal | ICD-10-CM

## 2015-08-11 DIAGNOSIS — F329 Major depressive disorder, single episode, unspecified: Secondary | ICD-10-CM

## 2015-08-11 MED ORDER — METHOCARBAMOL 500 MG PO TABS: 500.0000 mg | ORAL_TABLET | Freq: Four times a day (QID) | ORAL | Status: DC

## 2015-08-11 MED ORDER — AMLODIPINE BESYLATE 10 MG PO TABS: 10.0000 mg | ORAL_TABLET | Freq: Every day | ORAL | Status: DC

## 2015-08-11 MED FILL — METHOCARBAMOL 500 MG TABLET: 500 | 30 days supply | Qty: 120 | Fill #0

## 2015-08-11 NOTE — Progress Notes (Signed)
Patient presents to clinic today c/o for follow-up of anxiety and depression. Has not started Remeron due to worry of risk of seizures. Continues Citalopram and Ativan as directed. Would like to discuss increase in frequency of Ativan instead of adding on a new medication. Denies new or worsening symptoms.  Past Medical History  Diagnosis Date  . Chest pain   . HTN (hypertension)   . Hyperlipemia   . DJD (degenerative joint disease)   . BPH (benign prostatic hypertrophy)   . Obesity   . OSA (obstructive sleep apnea)   . Hypercholesteremia   . Chronic back pain   . Anxiety   . Depression     Current Outpatient Prescriptions on File Prior to Visit  Medication Sig Dispense Refill  . aspirin 81 MG tablet Take 81 mg by mouth daily.    . carvedilol (COREG) 6.25 MG tablet Take 1 tablet (6.25 mg total) by mouth 2 (two) times daily with a meal. 180 tablet 0  . Cholecalciferol (VITAMIN D-3) 1000 UNITS CAPS Take 2,000 Units by mouth daily.    . citalopram (CELEXA) 40 MG tablet TAKE 1 TABLET BY MOUTH EVERY DAY FOR MOOD 30 tablet 5  . famotidine (PEPCID) 20 MG tablet TAKE 1 TABLET (20 MG TOTAL) BY MOUTH 2 (TWO) TIMES DAILY. 60 tablet 5  . fluticasone (FLONASE) 50 MCG/ACT nasal spray Place 1 spray into both nostrils daily. 16 g 5  . hydrALAZINE (APRESOLINE) 50 MG tablet TAKE 1 TABLET BY MOUTH EVERY 8 HOURS 90 tablet 3  . Lacosamide 100 MG TABS Take 1 tablet (100 mg total) by mouth 2 (two) times daily. 60 tablet 2  . levETIRAcetam (KEPPRA) 1000 MG tablet Take 1 tablet (1,000 mg total) by mouth 2 (two) times daily. 180 tablet 3  . Multiple Vitamin (MULTIVITAMIN WITH MINERALS) TABS tablet Take 1 tablet by mouth daily.    . naproxen sodium (ANAPROX) 220 MG tablet Take 660 mg by mouth 2 (two) times daily as needed (pain.).    Marland Kitchen Omega-3 Fatty Acids (FISH OIL BURP-LESS PO) Take 1 tablet by mouth daily.       No current facility-administered medications on file prior to visit.    Allergies    Allergen Reactions  . Morphine And Related Anaphylaxis and Shortness Of Breath  . Montelukast Other (See Comments)    unknown  . Neurontin [Gabapentin] Other (See Comments)    delusional  . Wellbutrin [Bupropion] Palpitations    Insomnia    Family History  Problem Relation Age of Onset  . Hypertension Mother   . Hypertension Father     Social History   Social History  . Marital Status: Married    Spouse Name: N/A  . Number of Children: 1  . Years of Education: N/A   Occupational History  . Disabled    Social History Main Topics  . Smoking status: Former Research scientist (life sciences)  . Smokeless tobacco: Never Used  . Alcohol Use: No  . Drug Use: No  . Sexual Activity: Not Asked   Other Topics Concern  . None   Social History Narrative    Review of Systems - See HPI.  All other ROS are negative.  BP 156/60 mmHg  Pulse 67  Temp(Src) 98.2 F (36.8 C) (Oral)  Ht '6\' 4"'$  (1.93 m)  Wt 240 lb 9.6 oz (109.135 kg)  BMI 29.30 kg/m2  SpO2 98%  Physical Exam  Constitutional: He is oriented to person, place, and time and well-developed, well-nourished, and in  no distress.  HENT:  Head: Normocephalic and atraumatic.  Eyes: Conjunctivae are normal.  Cardiovascular: Normal rate, regular rhythm, normal heart sounds and intact distal pulses.   Pulmonary/Chest: Effort normal and breath sounds normal. No respiratory distress. He has no wheezes. He has no rales. He exhibits no tenderness.  Neurological: He is alert and oriented to person, place, and time.  Skin: Skin is warm and dry. No rash noted.  Psychiatric: Affect normal.  Vitals reviewed.   Recent Results (from the past 2160 hour(s))  I-STAT creatinine     Status: Abnormal   Collection Time: 06/08/15  3:49 PM  Result Value Ref Range   Creatinine, Ser 1.30 (H) 0.61 - 1.24 mg/dL  Basic metabolic panel     Status: Abnormal   Collection Time: 07/22/15 10:58 AM  Result Value Ref Range   Sodium 143 135 - 145 mmol/L   Potassium 3.0 (L)  3.5 - 5.1 mmol/L   Chloride 110 101 - 111 mmol/L   CO2 19 (L) 22 - 32 mmol/L   Glucose, Bld 119 (H) 65 - 99 mg/dL   BUN 8 6 - 20 mg/dL   Creatinine, Ser 1.40 (H) 0.61 - 1.24 mg/dL   Calcium 10.7 (H) 8.9 - 10.3 mg/dL   GFR calc non Af Amer 52 (L) >60 mL/min   GFR calc Af Amer >60 >60 mL/min    Comment: (NOTE) The eGFR has been calculated using the CKD EPI equation. This calculation has not been validated in all clinical situations. eGFR's persistently <60 mL/min signify possible Chronic Kidney Disease.    Anion gap 14 5 - 15  CBC     Status: Abnormal   Collection Time: 07/22/15 10:58 AM  Result Value Ref Range   WBC 8.8 4.0 - 10.5 K/uL   RBC 4.58 4.22 - 5.81 MIL/uL   Hemoglobin 13.7 13.0 - 17.0 g/dL   HCT 38.9 (L) 39.0 - 52.0 %   MCV 84.9 78.0 - 100.0 fL   MCH 29.9 26.0 - 34.0 pg   MCHC 35.2 30.0 - 36.0 g/dL   RDW 12.8 11.5 - 15.5 %   Platelets 301 150 - 400 K/uL  Hepatic function panel     Status: None   Collection Time: 07/22/15 10:58 AM  Result Value Ref Range   Total Protein 6.9 6.5 - 8.1 g/dL   Albumin 4.1 3.5 - 5.0 g/dL   AST 27 15 - 41 U/L   ALT 19 17 - 63 U/L   Alkaline Phosphatase 53 38 - 126 U/L   Total Bilirubin 0.6 0.3 - 1.2 mg/dL   Bilirubin, Direct 0.2 0.1 - 0.5 mg/dL   Indirect Bilirubin 0.4 0.3 - 0.9 mg/dL  Lipase, blood     Status: Abnormal   Collection Time: 07/22/15 10:58 AM  Result Value Ref Range   Lipase 72 (H) 11 - 51 U/L  I-stat troponin, ED (not at [facility], [facility])     Status: None   Collection Time: 07/22/15 11:10 AM  Result Value Ref Range   Troponin i, poc 0.05 0.00 - 0.08 ng/mL   Comment 3            Comment: Due to the release kinetics of cTnI, a negative result within the first hours of the onset of symptoms does not rule out myocardial infarction with certainty. If myocardial infarction is still suspected, repeat the test at appropriate intervals.   I-stat troponin, ED     Status: None   Collection Time: 07/22/15  2:06 PM  Result Value  Ref Range   Troponin i, poc 0.04 0.00 - 0.08 ng/mL   Comment 3            Comment: Due to the release kinetics of cTnI, a negative result within the first hours of the onset of symptoms does not rule out myocardial infarction with certainty. If myocardial infarction is still suspected, repeat the test at appropriate intervals.   Urine rapid drug screen (hosp performed)     Status: Abnormal   Collection Time: 07/22/15  2:14 PM  Result Value Ref Range   Opiates NONE DETECTED NONE DETECTED   Cocaine NONE DETECTED NONE DETECTED   Benzodiazepines POSITIVE (A) NONE DETECTED   Amphetamines NONE DETECTED NONE DETECTED   Tetrahydrocannabinol NONE DETECTED NONE DETECTED   Barbiturates NONE DETECTED NONE DETECTED    Comment:        DRUG SCREEN FOR MEDICAL PURPOSES ONLY.  IF CONFIRMATION IS NEEDED FOR ANY PURPOSE, NOTIFY LAB WITHIN 5 DAYS.        LOWEST DETECTABLE LIMITS FOR URINE DRUG SCREEN Drug Class       Cutoff (ng/mL) Amphetamine      1000 Barbiturate      200 Benzodiazepine   021 Tricyclics       115 Opiates          300 Cocaine          300 THC              50     Assessment/Plan: Anxiety and depression Will agree to increase Ativan dosing to TID once discussed with Neurologist. Continue Citalopram. Will remain off of Remeron for now. Follow-up with Neurology as scheduled.

## 2015-08-11 NOTE — Progress Notes (Signed)
Pre visit review using our clinic review tool, if applicable. No additional management support is needed unless otherwise documented below in the visit note. 

## 2015-08-11 NOTE — Patient Instructions (Signed)
I will call you once I have spoken with Dr. Karel Jarvis regarding your medications. Continue chronic medications as directed.  Follow-up with me in 3 months. Return sooner if needed.

## 2015-08-14 ENCOUNTER — Telehealth: Payer: Self-pay | Admitting: Physician Assistant

## 2015-08-14 ENCOUNTER — Other Ambulatory Visit: Payer: Self-pay | Admitting: Physician Assistant

## 2015-08-14 MED ORDER — LORAZEPAM 1 MG PO TABS: 1.0000 mg | ORAL_TABLET | Freq: Two times a day (BID) | ORAL | Status: DC

## 2015-08-14 MED ORDER — LORAZEPAM 1 MG PO TABS: 1.0000 mg | ORAL_TABLET | Freq: Three times a day (TID) | ORAL | Status: DC | PRN

## 2015-08-14 MED FILL — LORazepam 1 MG TABS: 1 | 30 days supply | Qty: 90 | Fill #0

## 2015-08-14 NOTE — Addendum Note (Signed)
Addended by: Verdie Shire on: 08/14/2015 02:53 PM   Modules accepted: Orders

## 2015-08-14 NOTE — Telephone Encounter (Signed)
Pt advise.//AB/CMA 

## 2015-08-14 NOTE — Telephone Encounter (Signed)
Pt wife coming to pick up at [facility] pharmacy and asked if we can send RX before 3:00pm today.

## 2015-08-14 NOTE — Telephone Encounter (Signed)
Caller name: Porfirio Mylar Relationship to patient: Wife Can be reached: [phone] or [phone]   Reason for call: Wife called to follow up on the Ativan rx for the patient. States that she was supposed to receive a call from Malva Cogan yesterday to let her know if the patient needs to take 2 or 3 tablets per day.

## 2015-08-14 NOTE — Telephone Encounter (Signed)
Have not heard anything from Dr. Karel Jarvis. Since he is tolerating well, I think it is ok to increase to TID dosing. Ok to send in new Rx to reflect change.

## 2015-08-14 NOTE — Telephone Encounter (Signed)
Rx printed and given to the pt's wife.//AB/CMA

## 2015-08-17 DIAGNOSIS — G8928 Other chronic postprocedural pain: Secondary | ICD-10-CM | POA: Diagnosis not present

## 2015-08-17 DIAGNOSIS — Z9889 Other specified postprocedural states: Secondary | ICD-10-CM | POA: Diagnosis not present

## 2015-08-17 DIAGNOSIS — M795 Residual foreign body in soft tissue: Secondary | ICD-10-CM | POA: Diagnosis not present

## 2015-08-18 ENCOUNTER — Ambulatory Visit: Payer: Medicare Other | Admitting: Family Medicine

## 2015-08-18 NOTE — Assessment & Plan Note (Addendum)
Will agree to increase Ativan dosing to TID once discussed with Neurologist. Continue Citalopram. Will remain off of Remeron for now. Follow-up with Neurology as scheduled.

## 2015-08-19 ENCOUNTER — Other Ambulatory Visit: Payer: Self-pay | Admitting: Internal Medicine

## 2015-08-20 MED FILL — AMLODIPINE BESYLATE 10 MG T: 10 | 90 days supply | Qty: 90 | Fill #0

## 2015-08-21 ENCOUNTER — Ambulatory Visit: Payer: Medicare Other | Admitting: Neurology

## 2015-08-26 ENCOUNTER — Ambulatory Visit (INDEPENDENT_AMBULATORY_CARE_PROVIDER_SITE_OTHER): Payer: Medicare Other | Admitting: Neurology

## 2015-08-26 DIAGNOSIS — I6783 Posterior reversible encephalopathy syndrome: Secondary | ICD-10-CM

## 2015-08-26 DIAGNOSIS — R569 Unspecified convulsions: Secondary | ICD-10-CM | POA: Diagnosis not present

## 2015-08-26 MED FILL — CITALOPRAM HBR 40 MG TABLET: 40 | 30 days supply | Qty: 30 | Fill #5

## 2015-08-26 MED FILL — FAMOTIDINE 20 MG TABLET: 20 | 30 days supply | Qty: 60 | Fill #4

## 2015-09-02 ENCOUNTER — Ambulatory Visit (INDEPENDENT_AMBULATORY_CARE_PROVIDER_SITE_OTHER): Payer: Medicare Other | Admitting: Neurology

## 2015-09-02 ENCOUNTER — Encounter: Payer: Self-pay | Admitting: Neurology

## 2015-09-02 VITALS — BP 132/78 | HR 62 | Ht 76.0 in | Wt 241.0 lb

## 2015-09-02 DIAGNOSIS — R569 Unspecified convulsions: Secondary | ICD-10-CM

## 2015-09-02 DIAGNOSIS — I6783 Posterior reversible encephalopathy syndrome: Secondary | ICD-10-CM

## 2015-09-02 DIAGNOSIS — F411 Generalized anxiety disorder: Secondary | ICD-10-CM | POA: Diagnosis not present

## 2015-09-02 MED FILL — hydrALAZINE HCL 50 MG TABS: 50 | 30 days supply | Qty: 90 | Fill #3

## 2015-09-02 MED FILL — FLUTICASONE PROP 50 MCG SPR: 50 | 30 days supply | Qty: 16 | Fill #1 | Status: TO

## 2015-09-02 NOTE — Progress Notes (Signed)
NEUROLOGY FOLLOW UP OFFICE NOTE  Jared [name] [id number]  HISTORY OF PRESENT ILLNESS: I had the pleasure of seeing Jared [name] in follow-up in the neurology clinic on 09/02/2015. The patient was last seen 2 months ago for follow-up on seizures that occurred in the setting of PRES last September 2016. He is again accompanied by his wife who helps supplement the history today. MRI brain with and without contrast which showed resolution of signal changes in the posterior region. There is note of encephalomalacia in the inferior posterior right occipital lobe and watershed zone compatible with previous cortical infarct. His wake and drowsy EEG was normal. He tapered off Vimpat last month and continues to be interested in further tapering off Keppra. His 24-hour EEG was normal, no epileptiform discharges seen. He denies any seizures or seizure-like symptoms. He was in the ER last month for back pain and shortness of breath. He had a head CT which showed the chronic right occipital infarct, no acute changes. He was instructed to follow-up with his pain specialist. He reports that he tends to get more teary-eyed and emotional daily, his anxiety has increased. Lorazepam dose was increased to  TID prn, he reports taking an average of twice a day.   HPI: This is a pleasant 60 yo LH man with a history of hypertension, hyperlipidemia, chronic back pain, anxiety, who had a complicated hospital course from August to September 2016. Hospital records were reviewed. He was found unresponsive by his wife on 01/08/15. He was noted to respond to BiPAP and Narcan, but was cyanotic and hypotensive. He had been complaining of chest and abdominal pain. He was intubated and apparently went into PEA arrest. He underwent exploratory laparotomy which did not show any intra-abdominal cause of acute decompensation. Cardiac cath negative. He was in the ICU for acute respiratory failure, severe septic shock, acute renal failure  requiring dialysis, shock liver, and started to improve clinically. He was transferred to the floor, per notes having difficulties with dialysis, with elevated BP, and had a witnessed seizure followed by respiratory arrest and again intubation. He recalls feeling unwell that day, nauseated and in pain. His wife came to his room and he was staring up at the ceiling (he does not recall this), he was seen by his doctors and was responding, then soon after his wife noticed his right arm was jerking, followed by whole body jerking. He was apparently able to answer questions initially, then a code blue was called and patient was re-intubated. He underwent overnight EEG, which showed right temporal non-convulsive seizures, which resolved over the course of the recording. There were right frontotemporal epileptiform discharges, often periodic in the initial portion of the recording, but largely isolated discharges by the end of the recording. Diffuse slowing with polymorphic theta and delta, that started to resolve by the end of the recording, persistent asymmetry, with greater slowing on the right, but also resolving over the course of recording. He had an MRI brain with and without contrast done 02/05/15 which I personally reviewed, showing increased FLAIR and T2 signal in the bilateral occipital and parietal lobes, posterior limb of the left internal capsul and corticospinal tracts, and left cerebellum, no restricted diffusion, compatible with posterior reversible encephalopathy syndrome. He continued to improve clinically, and was discharged home on Keppra  BID and Vimpat  BID. He denies any side effects on the medications.   He continues to do well, with no further seizures or seizure-like symptoms. He continues to deal  with anxiety, and feels that this may have been the cause of his initial symptoms, reporting that he had seen his previous PCP and reporting anxiety, started on Wellbutrin which did not  help, and was given Lyrica, which he took for 2 days, then he came in unresponsive to the hospital. He denies any significant headaches, he denies any dizziness bu continues to work on his balance since deconditioning from his hospital stay, working with PT and doing home exercises. He denies any diplopia, dysarthria, dysphagia, focal numbness/tingling/weakness, bowel/bladder dysfunction. No further dialysis needed since hospital discharges. He has chronic back and left leg pain. He denies any olfactory/gustatory hallucinations, deja vu, rising epigastric sensation, focal numbness/tingling/weakness, myoclonic jerks. He had a normal birth and early development. There is no history of febrile convulsions, CNS infections such as meningitis/encephalitis, significant traumatic brain injury requiring neurosurgical procedures, or family history of seizures  PAST MEDICAL HISTORY: Past Medical History  Diagnosis Date  . Chest pain   . HTN (hypertension)   . Hyperlipemia   . DJD (degenerative joint disease)   . BPH (benign prostatic hypertrophy)   . Obesity   . OSA (obstructive sleep apnea)   . Hypercholesteremia   . Chronic back pain   . Anxiety   . Depression     MEDICATIONS: Current Outpatient Prescriptions on File Prior to Visit  Medication Sig Dispense Refill  . amLODipine (NORVASC) 10 MG tablet Take 1 tablet (10 mg total) by mouth daily. 90 tablet 1  . aspirin 81 MG tablet Take 81 mg by mouth daily.    . carvedilol (COREG) 6.25 MG tablet Take 1 tablet (6.25 mg total) by mouth 2 (two) times daily with a meal. 180 tablet 0  . Cholecalciferol (VITAMIN D-3) 1000 UNITS CAPS Take 2,000 Units by mouth daily.    . citalopram (CELEXA) 40 MG tablet TAKE 1 TABLET BY MOUTH EVERY DAY FOR MOOD 30 tablet 5  . famotidine (PEPCID) 20 MG tablet TAKE 1 TABLET (20 MG TOTAL) BY MOUTH 2 (TWO) TIMES DAILY. 60 tablet 5  . fluticasone (FLONASE) 50 MCG/ACT nasal spray Place 1 spray into both nostrils daily. 16 g 5  .  hydrALAZINE (APRESOLINE) 50 MG tablet TAKE 1 TABLET BY MOUTH EVERY 8 HOURS 90 tablet 3  . levETIRAcetam (KEPPRA) 1000 MG tablet Take 1 tablet (1,000 mg total) by mouth 2 (two) times daily. 180 tablet 3  . LORazepam (ATIVAN) 1 MG tablet Take 1 tablet (1 mg total) by mouth every 8 (eight) hours as needed for anxiety. 90 tablet 1  . methocarbamol (ROBAXIN) 500 MG tablet Take 1 tablet (500 mg total) by mouth 4 (four) times daily. 120 tablet 1  . Multiple Vitamin (MULTIVITAMIN WITH MINERALS) TABS tablet Take 1 tablet by mouth daily.    . naproxen sodium (ANAPROX) 220 MG tablet Take 660 mg by mouth 2 (two) times daily as needed (pain.).    Marland Kitchen Omega-3 Fatty Acids (FISH OIL BURP-LESS PO) Take 1 tablet by mouth daily.       No current facility-administered medications on file prior to visit.    ALLERGIES: Allergies  Allergen Reactions  . Morphine And Related Anaphylaxis and Shortness Of Breath  . Montelukast Other (See Comments)    unknown  . Neurontin [Gabapentin] Other (See Comments)    delusional  . Wellbutrin [Bupropion] Palpitations    Insomnia    FAMILY HISTORY: Family History  Problem Relation Age of Onset  . Hypertension Mother   . Hypertension Father  SOCIAL HISTORY: Social History   Social History  . Marital Status: Married    Spouse Name: N/A  . Number of Children: 1  . Years of Education: N/A   Occupational History  . Disabled    Social History Main Topics  . Smoking status: Former Games developer  . Smokeless tobacco: Never Used  . Alcohol Use: No  . Drug Use: No  . Sexual Activity: Not on file   Other Topics Concern  . Not on file   Social History Narrative    REVIEW OF SYSTEMS: Constitutional: No fevers, chills, or sweats, no generalized fatigue, change in appetite Eyes: No visual changes, double vision, eye pain Ear, nose and throat: No hearing loss, ear pain, nasal congestion, sore throat Cardiovascular: No chest pain, palpitations Respiratory:  No  shortness of breath at rest or with exertion, wheezes GastrointestinaI: No nausea, vomiting, diarrhea, abdominal pain, fecal incontinence Genitourinary:  No dysuria, urinary retention or frequency Musculoskeletal:  + neck pain, back pain Integumentary: No rash, pruritus, skin lesions Neurological: as above Psychiatric: No depression, insomnia, anxiety Endocrine: No palpitations, fatigue, diaphoresis, mood swings, change in appetite, change in weight, increased thirst Hematologic/Lymphatic:  No anemia, purpura, petechiae. Allergic/Immunologic: no itchy/runny eyes, nasal congestion, recent allergic reactions, rashes  PHYSICAL EXAM: Filed Vitals:   09/02/15 1539  BP: 132/78  Pulse: 62   General: No acute distress Head:  Normocephalic/atraumatic Neck: supple, no paraspinal tenderness, full range of motion Heart:  Regular rate and rhythm Lungs:  Clear to auscultation bilaterally Back: No paraspinal tenderness Skin/Extremities: No rash, no edema Neurological Exam: alert and oriented to person, place, and time. No aphasia or dysarthria. Fund of knowledge is appropriate.  Recent and remote memory are intact.  Attention and concentration are normal.    Able to name objects and repeat phrases. Cranial nerves: Pupils equal, round, reactive to light. Extraocular movements intact with no nystagmus. Visual fields full. Facial sensation intact. No facial asymmetry. Tongue, uvula, palate midline.  Motor: Bulk and tone normal, muscle strength 5/5 throughout with no pronator drift.  Sensation to light touch intact.  No extinction to double simultaneous stimulation.  Deep tendon reflexes 2+ throughout, toes downgoing.  Finger to nose testing intact.  Gait narrow-based and steady, able to tandem walk adequately.  Romberg negative.  IMPRESSION: This is a 60 yo LH man with a history of hypertension, hyperlipidemia, anxiety, chronic back pain, with a prolonged hospital admission last September 2016 for septic  shock with multi-organ failure, complicated by posterior reversible encephalopathy with symptomatic seizure. His MRI in September had shown typical findings seen in PRES, and his EEG had shown seizures arising from the right temporal region and right frontotemporal epileptiform discharges. He denies any seizures since his hospital stay in September 2016. He has tapered off Vimpat with no seizure recurrence, currently on Keppra 1000mg  BID. He reports increased emotionality and anxiety, although this can be seen with Keppra, he has had these issues prior to starting medication. His 24-hour EEG is normal, we will continue AED taper, he was given tapering instructions for Keppra with plans to stop in 5 weeks. He is aware of risks of breakthrough seizure with any medication adjustment and knows to call our office for any change in symptoms. He was advised not to drive for 6 months from medication taper. We also discussed lorazepam intake, would minimize as much as he can, and once stable off Keppra, potentially discuss other anxiolytic options with his doctors due to risk of tolerance and dependence  on lorazepam, as well as withdrawal seizures if stopped abruptly. He will follow-up in 5-6 months and knows to call for any changes.   Thank you for allowing me to participate in his care.  Please do not hesitate to call for any questions or concerns.  The duration of this appointment visit was 25 minutes of face-to-face time with the patient.  Greater than 50% of this time was spent in counseling, explanation of diagnosis, planning of further management, and coordination of care.   Patrcia Dolly, M.D.   CC: Marcelline Mates, PA-C

## 2015-09-02 NOTE — Patient Instructions (Signed)
1. Start tapering Keppra 1000mg :  Week 1: Take 1/2 tablet in AM, 1 tablet in PM Week 2: Take 1/2 tablet twice a day Week 3: Take 1/2 tablet every night Week 4: take 1/2 tablet every other night, then stop  If at any point you have a change in symptoms, call our office  2. Recommend holding off on driving for 6 months when we taper you off medication completely  3. Follow-up in 5 months, call for any changes

## 2015-09-03 DIAGNOSIS — F411 Generalized anxiety disorder: Secondary | ICD-10-CM | POA: Insufficient documentation

## 2015-09-08 NOTE — Procedures (Signed)
ELECTROENCEPHALOGRAM REPORT  Dates of Recording: 08/26/2015 to 08/27/2015  Patient's Name: Jared [name] MRN: [id number] Date of Birth: [birth year]  Referring Provider: Dr. Patrcia Dolly  Procedure: 24-hour ambulatory EEG  History: This is a 60 year old man with a prolonged hospital admission last September 2016 for septic shock with multi-organ failure, complicated by posterior reversible encephalopathy with symptomatic seizure. EEG at that time had shown seizures arising from the right temporal region and right frontotemporal epileptiform discharges. No seizures since September 2016.  Medications: Keppra, Ativan, Norvasc, Coreg, Pepcid, Vitamin D-3, Celexa, Apresoline, Bystolic, Fish Oil  Technical Summary: This is a 24-hour multichannel digital EEG recording measured by the international 10-20 system with electrodes applied with paste and impedances below 5000 ohms performed as portable with EKG monitoring.  The digital EEG was referentially recorded, reformatted, and digitally filtered in a variety of bipolar and referential montages for optimal display.    DESCRIPTION OF RECORDING: During maximal wakefulness, the background activity consisted of a symmetric 9 Hz posterior dominant rhythm which was reactive to eye opening.  There were no epileptiform discharges or focal slowing seen in wakefulness.  During the recording, the patient progresses through wakefulness, drowsiness, and Stage 2 sleep.  Again, there were no epileptiform discharges seen.  Events: There were no push button events.   There were no electrographic seizures seen.  EKG lead showed rare extrasystolic beats.   IMPRESSION: This 24-hour ambulatory EEG study is normal.    CLINICAL CORRELATION: A normal EEG does not exclude a clinical diagnosis of epilepsy. Typical events were not captured. If further clinical questions remain, inpatient video EEG monitoring may be helpful.   Patrcia Dolly, M.D.

## 2015-09-10 ENCOUNTER — Encounter: Payer: Self-pay | Admitting: Internal Medicine

## 2015-09-10 MED FILL — LORazepam 1 MG TABS: 1 | 30 days supply | Qty: 90 | Fill #1

## 2015-09-10 MED FILL — METHOCARBAMOL 500 MG TABLET: 500 | 30 days supply | Qty: 120 | Fill #1

## 2015-09-17 DIAGNOSIS — M961 Postlaminectomy syndrome, not elsewhere classified: Secondary | ICD-10-CM | POA: Diagnosis not present

## 2015-09-24 ENCOUNTER — Other Ambulatory Visit: Payer: Self-pay | Admitting: Physician Assistant

## 2015-09-24 MED FILL — CITALOPRAM HBR 40 MG TABLET: 40 | 30 days supply | Qty: 30 | Fill #0

## 2015-09-28 ENCOUNTER — Other Ambulatory Visit: Payer: Self-pay | Admitting: Physician Assistant

## 2015-09-28 ENCOUNTER — Encounter (HOSPITAL_COMMUNITY): Payer: Self-pay

## 2015-09-28 ENCOUNTER — Other Ambulatory Visit: Payer: Self-pay | Admitting: Family Medicine

## 2015-09-28 ENCOUNTER — Emergency Department (HOSPITAL_COMMUNITY)
Admission: EM | Admit: 2015-09-28 | Discharge: 2015-09-29 | Disposition: A | Discharge status: Home / Self Care | Payer: Medicare Other | Attending: Emergency Medicine | Admitting: Emergency Medicine

## 2015-09-28 ENCOUNTER — Emergency Department (HOSPITAL_COMMUNITY): Payer: Medicare Other

## 2015-09-28 ENCOUNTER — Telehealth: Payer: Self-pay | Admitting: Family Medicine

## 2015-09-28 DIAGNOSIS — F329 Major depressive disorder, single episode, unspecified: Secondary | ICD-10-CM | POA: Diagnosis not present

## 2015-09-28 DIAGNOSIS — K5900 Constipation, unspecified: Secondary | ICD-10-CM | POA: Diagnosis not present

## 2015-09-28 DIAGNOSIS — R1084 Generalized abdominal pain: Secondary | ICD-10-CM | POA: Diagnosis not present

## 2015-09-28 DIAGNOSIS — R0602 Shortness of breath: Secondary | ICD-10-CM | POA: Insufficient documentation

## 2015-09-28 DIAGNOSIS — Z87891 Personal history of nicotine dependence: Secondary | ICD-10-CM | POA: Insufficient documentation

## 2015-09-28 DIAGNOSIS — Z79899 Other long term (current) drug therapy: Secondary | ICD-10-CM | POA: Insufficient documentation

## 2015-09-28 DIAGNOSIS — R1013 Epigastric pain: Secondary | ICD-10-CM | POA: Diagnosis not present

## 2015-09-28 DIAGNOSIS — E669 Obesity, unspecified: Secondary | ICD-10-CM | POA: Insufficient documentation

## 2015-09-28 DIAGNOSIS — R112 Nausea with vomiting, unspecified: Secondary | ICD-10-CM | POA: Diagnosis not present

## 2015-09-28 DIAGNOSIS — R109 Unspecified abdominal pain: Secondary | ICD-10-CM | POA: Diagnosis present

## 2015-09-28 DIAGNOSIS — G8929 Other chronic pain: Secondary | ICD-10-CM | POA: Insufficient documentation

## 2015-09-28 DIAGNOSIS — F419 Anxiety disorder, unspecified: Secondary | ICD-10-CM | POA: Diagnosis not present

## 2015-09-28 DIAGNOSIS — I10 Essential (primary) hypertension: Secondary | ICD-10-CM | POA: Diagnosis not present

## 2015-09-28 DIAGNOSIS — Z7982 Long term (current) use of aspirin: Secondary | ICD-10-CM | POA: Diagnosis not present

## 2015-09-28 DIAGNOSIS — Z87438 Personal history of other diseases of male genital organs: Secondary | ICD-10-CM | POA: Diagnosis not present

## 2015-09-28 DIAGNOSIS — R197 Diarrhea, unspecified: Secondary | ICD-10-CM | POA: Insufficient documentation

## 2015-09-28 LAB — CBC
HCT: 39.4 % (ref 39.0–52.0)
Hemoglobin: 13.3 g/dL (ref 13.0–17.0)
MCH: 30 pg (ref 26.0–34.0)
MCHC: 33.8 g/dL (ref 30.0–36.0)
MCV: 88.9 fL (ref 78.0–100.0)
PLATELETS: 240 10*3/uL (ref 150–400)
RBC: 4.43 MIL/uL (ref 4.22–5.81)
RDW: 12.4 % (ref 11.5–15.5)
WBC: 9.9 10*3/uL (ref 4.0–10.5)

## 2015-09-28 LAB — COMPREHENSIVE METABOLIC PANEL
ALK PHOS: 81 U/L (ref 38–126)
ALT: 54 U/L (ref 17–63)
AST: 28 U/L (ref 15–41)
Albumin: 3.6 g/dL (ref 3.5–5.0)
Anion gap: 10 (ref 5–15)
BUN: 8 mg/dL (ref 6–20)
CALCIUM: 9.8 mg/dL (ref 8.9–10.3)
CO2: 28 mmol/L (ref 22–32)
CREATININE: 1.26 mg/dL — AB (ref 0.61–1.24)
Chloride: 104 mmol/L (ref 101–111)
GFR, EST NON AFRICAN AMERICAN: 59 mL/min — AB (ref >60)
Glucose, Bld: 128 mg/dL — ABNORMAL HIGH (ref 65–99)
Potassium: 3.5 mmol/L (ref 3.5–5.1)
Sodium: 142 mmol/L (ref 135–145)
Total Bilirubin: 0.6 mg/dL (ref 0.3–1.2)
Total Protein: 6.5 g/dL (ref 6.5–8.1)

## 2015-09-28 LAB — LIPASE, BLOOD: Lipase: 32 U/L (ref 11–51)

## 2015-09-28 MED ORDER — OXYCODONE HCL 5 MG PO TABS
30.0000 mg | ORAL_TABLET | Freq: Once | ORAL | Status: AC
Start: 2015-09-28 — End: 2015-09-29
  Administered 2015-09-29: 30 mg via ORAL
  Filled 2015-09-28: qty 6

## 2015-09-28 MED FILL — hydrALAZINE HCL 50 MG TABS: 50 | 30 days supply | Qty: 90 | Fill #0

## 2015-09-28 MED FILL — CARVEDILOL 6.25 MG TABLET: 6.25 | 90 days supply | Qty: 180 | Fill #0

## 2015-09-28 NOTE — Telephone Encounter (Signed)
Rx sent to the pharmacy by e-script.//AB/CMA 

## 2015-09-28 NOTE — ED Notes (Signed)
Pt states nausea/vomiting Saturday. Pt complaining of shortness of breath and abdominal pain. Recent abdominal surgery in August 2016. Pt also endorses diarrhea Sunday.

## 2015-09-28 NOTE — Telephone Encounter (Signed)
Patient called ans states that he has not been feeling well since Sat. States he's been feeling hot and cold but hasn't had a fever, states that he has some diarrhea this morning and has also vomited. I did advise patient to contact his pcp's office to let them know what his sxs were and to be seen if they felt it was necessary.

## 2015-09-29 ENCOUNTER — Emergency Department (HOSPITAL_COMMUNITY): Payer: Medicare Other

## 2015-09-29 DIAGNOSIS — R112 Nausea with vomiting, unspecified: Secondary | ICD-10-CM | POA: Diagnosis not present

## 2015-09-29 DIAGNOSIS — R109 Unspecified abdominal pain: Secondary | ICD-10-CM | POA: Diagnosis not present

## 2015-09-29 MED ORDER — HYDROMORPHONE HCL 1 MG/ML IJ SOLN
1.0000 mg | Freq: Once | INTRAMUSCULAR | Status: AC
  Administered 2015-09-29: 1 mg via INTRAMUSCULAR
  Filled 2015-09-29: qty 1

## 2015-09-29 MED ORDER — ONDANSETRON 4 MG PO TBDP
4.0000 mg | ORAL_TABLET | Freq: Once | ORAL | Status: AC
  Administered 2015-09-29: 4 mg via ORAL
  Filled 2015-09-29: qty 1

## 2015-09-29 MED ORDER — ONDANSETRON 4 MG PO TBDP: 4.0000 mg | ORAL_TABLET | Freq: Three times a day (TID) | ORAL | Status: DC | PRN

## 2015-09-29 NOTE — Discharge Instructions (Signed)
FOLLOW UP WITH YOUR DOCTOR FOR RECHECK IN 2-3 DAYS. RETURN TO THE EMERGENCY DEPARTMENT WITH ANY HIGH FEVER, SEVERE PAIN, NEW OR WORSENING SYMPTOMS.

## 2015-09-29 NOTE — ED Provider Notes (Signed)
CSN: [id number]     Arrival date & time 09/28/15  1555 History   First MD Initiated Contact with Patient 09/28/15 2258     Chief Complaint  Patient presents with  . Abdominal Pain  . Shortness of Breath     (Consider location/radiation/quality/duration/timing/severity/associated sxs/prior Treatment) HPI Comments: The patient presents with his wife and complains of nausea and vomiting 3 days ago, with persistent nausea since. He has been belching and when he belches there is malodor that the patient thinks smells fecal. He continues to pass gas with bowel movements that alternate between constipated and hard, and soft and frequent. No fever or significant abdominal pain. He complains of back pain but reports chronic back pain under Pain management that is not different today. He states he was admitted from August to October last year with sepsis and seizure and underwent exploratory abdominal surgery. He states that the surgery did not find any abnormalities. When he started having the nausea with vomiting and belching, he was concerned it was related to his previous illness.   Patient is a 60 y.o. male presenting with abdominal pain and shortness of breath. The history is provided by the patient and the spouse. No language interpreter was used.  Abdominal Pain Associated symptoms: constipation, diarrhea, nausea, shortness of breath and vomiting   Associated symptoms: no chills, no dysuria and no fever   Shortness of Breath Associated symptoms: abdominal pain and vomiting   Associated symptoms: no fever     Past Medical History  Diagnosis Date  . Chest pain   . HTN (hypertension)   . Hyperlipemia   . DJD (degenerative joint disease)   . BPH (benign prostatic hypertrophy)   . Obesity   . OSA (obstructive sleep apnea)   . Hypercholesteremia   . Chronic back pain   . Anxiety   . Depression    Past Surgical History  Procedure Laterality Date  . Back surgery      Lspine w/Hardware  .  Prostatectomy    . Cervical spine surgery      w/Hardware  . Laparotomy N/A 01/08/2015    Procedure: EXPLORATORY LAPAROTOMY;  Surgeon: Manus Rudd, MD;  Location: [facility] OR;  Service: General;  Laterality: N/A;  . Application of wound vac N/A 01/08/2015    Procedure: APPLICATION OF WOUND VAC;  Surgeon: Manus Rudd, MD;  Location: [facility] OR;  Service: General;  Laterality: N/A;  . Wound debridement N/A 01/11/2015    Procedure: DEBRIDEMENT WOUND AND VAC DRESSING CHANGE;  Surgeon: Manus Rudd, MD;  Location: [facility] OR;  Service: General;  Laterality: N/A;  . Cardiac catheterization N/A 01/13/2015    Procedure: Left Heart Cath and Coronary Angiography;  Surgeon: Corky Crafts, MD;  Location: [facility] INVASIVE CV LAB;  Service: Cardiovascular;  Laterality: N/A;   Family History  Problem Relation Age of Onset  . Hypertension Mother   . Hypertension Father    Social History  Substance Use Topics  . Smoking status: Former Games developer  . Smokeless tobacco: Never Used  . Alcohol Use: No    Review of Systems  Constitutional: Negative for fever and chills.  HENT: Negative.   Respiratory: Positive for shortness of breath.   Cardiovascular: Negative.   Gastrointestinal: Positive for nausea, vomiting, abdominal pain, diarrhea and constipation. Negative for abdominal distention.  Genitourinary: Negative.  Negative for dysuria and decreased urine volume.  Musculoskeletal: Negative.   Skin: Negative.   Neurological: Negative.   Psychiatric/Behavioral: Negative for confusion.  Allergies  Morphine and related; Montelukast; Neurontin; and Wellbutrin  Home Medications   Prior to Admission medications   Medication Sig Start Date End Date Taking? Authorizing Provider  amLODipine (NORVASC) 10 MG tablet Take 1 tablet (10 mg total) by mouth daily. 08/11/15  Yes Waldon Merl, PA-C  aspirin 81 MG tablet Take 81 mg by mouth daily.   Yes Historical Provider, MD  carvedilol (COREG) 6.25 MG tablet TAKE 1  TABLET BY MOUTH TWICE DAILY WITH A MEAL 09/28/15  Yes Bradd Canary, MD  Cholecalciferol (VITAMIN D-3) 1000 UNITS CAPS Take 2,000 Units by mouth daily.   Yes Historical Provider, MD  citalopram (CELEXA) 40 MG tablet TAKE 1 TABLET BY MOUTH EVERY DAY FOR MOOD 09/24/15  Yes Waldon Merl, PA-C  famotidine (PEPCID) 20 MG tablet TAKE 1 TABLET (20 MG TOTAL) BY MOUTH 2 (TWO) TIMES DAILY. 04/24/15  Yes Waldon Merl, PA-C  fluticasone (FLONASE) 50 MCG/ACT nasal spray Place 1 spray into both nostrils daily. 07/13/15 11/03/15 Yes Waldon Merl, PA-C  hydrALAZINE (APRESOLINE) 50 MG tablet TAKE 1 TABLET BY MOUTH EVERY 8 HOURS 09/28/15  Yes Waldon Merl, PA-C  levETIRAcetam (KEPPRA) 1000 MG tablet Take 1 tablet (1,000 mg total) by mouth 2 (two) times daily. Patient taking differently: Take 500 mg by mouth every other day. Every evening 06/24/15  Yes Van Clines, MD  LORazepam (ATIVAN) 1 MG tablet Take 1 tablet (1 mg total) by mouth every 8 (eight) hours as needed for anxiety. 08/14/15  Yes Waldon Merl, PA-C  methocarbamol (ROBAXIN) 500 MG tablet Take 1 tablet (500 mg total) by mouth 4 (four) times daily. 08/11/15  Yes Waldon Merl, PA-C  Multiple Vitamin (MULTIVITAMIN WITH MINERALS) TABS tablet Take 1 tablet by mouth daily. 02/11/15  Yes Bernadene Person, NP  Omega-3 Fatty Acids (FISH OIL BURP-LESS PO) Take 1 tablet by mouth daily.     Yes Historical Provider, MD  oxycodone (ROXICODONE) 30 MG immediate release tablet Take 15-30 mg by mouth every 6 (six) hours as needed for pain.  08/23/15  Yes Historical Provider, MD  ondansetron (ZOFRAN ODT) 4 MG disintegrating tablet Take 1 tablet (4 mg total) by mouth every 8 (eight) hours as needed for nausea or vomiting. 09/29/15   Elpidio Anis, PA-C   BP 153/83 mmHg  Pulse 61  Temp(Src) 98.4 F (36.9 C) (Oral)  Resp 18  SpO2 96% Physical Exam  Constitutional: He is oriented to person, place, and time. He appears well-developed and well-nourished.  HENT:   Head: Normocephalic.  Neck: Normal range of motion. Neck supple.  Cardiovascular: Normal rate and regular rhythm.   No murmur heard. Pulmonary/Chest: Effort normal and breath sounds normal. He has no wheezes. He has no rales.  Abdominal: Soft. Bowel sounds are normal. There is no tenderness. There is no rebound and no guarding.  Well healed midline surgical scar. Abdomen is mildly tender diffusely. No mass. No rebound or guarding.   Musculoskeletal: Normal range of motion.  Neurological: He is alert and oriented to person, place, and time.  Skin: Skin is warm and dry. No rash noted.  Psychiatric: He has a normal mood and affect.    ED Course  Procedures (including critical care time) Labs Review Labs Reviewed  COMPREHENSIVE METABOLIC PANEL - Abnormal; Notable for the following:    Glucose, Bld 128 (*)    Creatinine, Ser 1.26 (*)    GFR calc non Af Amer 59 (*)    All  other components within normal limits  LIPASE, BLOOD  CBC    Imaging Review Dg Chest 2 View  09/28/2015  CLINICAL DATA:  Nausea and vomiting for 2-3 days EXAM: CHEST  2 VIEW COMPARISON:  07/22/2015 FINDINGS: Normal heart size. Clear lungs. No pneumothorax or pleural effusion. IMPRESSION: No active cardiopulmonary disease. Electronically Signed   By: Jolaine Click M.D.   On: 09/28/2015 16:35   Dg Abd 2 Views  09/29/2015  CLINICAL DATA:  Initial valuation for acute abdominal pain, nausea, vomiting. EXAM: ABDOMEN - 2 VIEW COMPARISON:  Prior study from 07/22/2015. FINDINGS: Bowel gas pattern within normal limits without evidence for obstruction or ileus. No significant bowel wall thickening. No definite free air. No abnormal bowel wall thickening. Small moderate amount of retained stool within the ascending colon. No soft tissue mass or abnormal calcification. Fixation hardware within the lower lumbar spine. No acute osseus abnormality. IMPRESSION: Nonobstructive bowel gas pattern with no radiographic evidence for acute  intra-abdominal process. Electronically Signed   By: Rise Mu M.D.   On: 09/29/2015 00:50   I have personally reviewed and evaluated these images and lab results as part of my medical decision-making.   EKG Interpretation None      MDM   Final diagnoses:  Abdominal pain  Dyspepsia    The patient presents with vomiting and malodorous belching. No vomiting in 2 days. No fever at any time. No evidence to suggest bowel obstruction. Labs are essentially normal. Plain film indicates normal BGP. The patient's pain is managed. He is given Zofran for symptoms of nausea and is felt stable for discharge home. Referral to GI is recommended.     Elpidio Anis, PA-C 09/29/15 [phone]

## 2015-09-30 MED FILL — ONDANSETRON ODT 4 MG TABLET: 4 | 6 days supply | Qty: 20 | Fill #0

## 2015-10-07 ENCOUNTER — Telehealth: Payer: Self-pay | Admitting: Physician Assistant

## 2015-10-07 NOTE — Telephone Encounter (Signed)
Ok to call in 1 refill. Same quantity and instructions. Will need to come in and give UDS sample before further refills.

## 2015-10-07 NOTE — Telephone Encounter (Signed)
Last OV: 07/24/15 Last filled: 08/14/15, #90, 1 RF  Sig: Take 1 tablet (1 mg total) by mouth every 8 (eight) hours as needed for anxiety. UDS: Not on file

## 2015-10-07 NOTE — Telephone Encounter (Signed)
Pt spouse request a refill on LORazepam   Pharmacy: [facility] - [place], [place] - [address]   CB: [phone]

## 2015-10-08 MED ORDER — LORAZEPAM 1 MG PO TABS: 1.0000 mg | ORAL_TABLET | Freq: Three times a day (TID) | ORAL | Status: DC | PRN

## 2015-10-08 MED FILL — LORazepam 1 MG TABS: 1 | 30 days supply | Qty: 90 | Fill #0

## 2015-10-08 NOTE — Telephone Encounter (Signed)
Rx phoned to pharmacy.  

## 2015-10-13 ENCOUNTER — Other Ambulatory Visit: Payer: Self-pay | Admitting: Physician Assistant

## 2015-10-14 MED FILL — METHOCARBAMOL 500 MG TABLET: 500 | 30 days supply | Qty: 120 | Fill #0

## 2015-10-14 NOTE — Telephone Encounter (Signed)
Requesting Methocarbamol 500mg -Take 1 tablet by mouth 4 times daily. Last refill:08/11/15;#120,1 Last OV:08/11/15 Please advise.//AB/CMA

## 2015-10-20 MED FILL — FAMOTIDINE 20 MG TABLET: 20 | 30 days supply | Qty: 60 | Fill #5

## 2015-10-27 MED FILL — CITALOPRAM HBR 40 MG TABLET: 40 | 30 days supply | Qty: 30 | Fill #1

## 2015-11-06 ENCOUNTER — Other Ambulatory Visit: Payer: Self-pay | Admitting: Physician Assistant

## 2015-11-06 MED FILL — hydrALAZINE HCL 50 MG TABS: 50 | 30 days supply | Qty: 90 | Fill #1

## 2015-11-06 NOTE — Telephone Encounter (Signed)
Requesting Lorazepam 1mg -Take 1 tablet by mouth every 8 hours as needed for anxiety. Last refill:10/08/15;#90,0 Last OV:08/11/15 UDS:No done Please advise.//AB/CMA

## 2015-11-09 MED FILL — LORazepam 1 MG TABS: 1 | 30 days supply | Qty: 90 | Fill #0

## 2015-11-09 NOTE — Telephone Encounter (Signed)
Rx faxed to pharmacy/[facility] 06/19 

## 2015-11-11 ENCOUNTER — Encounter: Payer: Self-pay | Admitting: Physician Assistant

## 2015-11-11 ENCOUNTER — Ambulatory Visit (INDEPENDENT_AMBULATORY_CARE_PROVIDER_SITE_OTHER): Payer: Medicare Other | Admitting: Physician Assistant

## 2015-11-11 VITALS — BP 110/72 | HR 56 | Temp 98.4°F | Resp 16 | Ht 76.0 in | Wt 240.0 lb

## 2015-11-11 DIAGNOSIS — F419 Anxiety disorder, unspecified: Secondary | ICD-10-CM

## 2015-11-11 DIAGNOSIS — F329 Major depressive disorder, single episode, unspecified: Secondary | ICD-10-CM

## 2015-11-11 DIAGNOSIS — I10 Essential (primary) hypertension: Secondary | ICD-10-CM | POA: Diagnosis not present

## 2015-11-11 DIAGNOSIS — F418 Other specified anxiety disorders: Secondary | ICD-10-CM | POA: Diagnosis not present

## 2015-11-11 MED ORDER — FAMOTIDINE 20 MG PO TABS: ORAL_TABLET | ORAL | Status: DC

## 2015-11-11 MED ORDER — CITALOPRAM HYDROBROMIDE 40 MG PO TABS: ORAL_TABLET | ORAL | Status: AC

## 2015-11-11 MED ORDER — LORAZEPAM 1 MG PO TABS: 1.0000 mg | ORAL_TABLET | Freq: Three times a day (TID) | ORAL | Status: DC | PRN

## 2015-11-11 MED ORDER — CARVEDILOL 6.25 MG PO TABS: ORAL_TABLET | ORAL | Status: AC

## 2015-11-11 MED ORDER — ONDANSETRON 4 MG PO TBDP: 4.0000 mg | ORAL_TABLET | Freq: Three times a day (TID) | ORAL | Status: DC | PRN

## 2015-11-11 MED ORDER — AMLODIPINE BESYLATE 10 MG PO TABS: 10.0000 mg | ORAL_TABLET | Freq: Every day | ORAL | Status: AC

## 2015-11-11 MED ORDER — HYDRALAZINE HCL 50 MG PO TABS: 50.0000 mg | ORAL_TABLET | Freq: Two times a day (BID) | ORAL | Status: AC

## 2015-11-11 MED FILL — AMLODIPINE BESYLATE 10 MG T: 10 | 90 days supply | Qty: 90 | Fill #0

## 2015-11-11 MED FILL — ONDANSETRON ODT 4 MG TABLET: 4 | 6 days supply | Qty: 20 | Fill #0

## 2015-11-11 NOTE — Patient Instructions (Signed)
Your BP looks good today. Refills have been sent in -- pay attention to dosing instructions.  Please continue medications as directed. FU with Neurology as scheduled.  Follow-up with me in 6 months. Return sooner if needed.  Remember to get a probiotic (Digestive Advantage is a good options). Take daily.   DASH Eating Plan DASH stands for "Dietary Approaches to Stop Hypertension." The DASH eating plan is a healthy eating plan that has been shown to reduce high blood pressure (hypertension). Additional health benefits may include reducing the risk of type 2 diabetes mellitus, heart disease, and stroke. The DASH eating plan may also help with weight loss. WHAT DO I NEED TO KNOW ABOUT THE DASH EATING PLAN? For the DASH eating plan, you will follow these general guidelines:  Choose foods with a percent daily value for sodium of less than 5% (as listed on the food label).  Use salt-free seasonings or herbs instead of table salt or sea salt.  Check with your health care provider or pharmacist before using salt substitutes.  Eat lower-sodium products, often labeled as "lower sodium" or "no salt added."  Eat fresh foods.  Eat more vegetables, fruits, and low-fat dairy products.  Choose whole grains. Look for the word "whole" as the first word in the ingredient list.  Choose fish and skinless chicken or [place] more often than red meat. Limit fish, poultry, and meat to 6 oz (170 g) each day.  Limit sweets, desserts, sugars, and sugary drinks.  Choose heart-healthy fats.  Limit cheese to 1 oz (28 g) per day.  Eat more home-cooked food and less restaurant, buffet, and fast food.  Limit fried foods.  Cook foods using methods other than frying.  Limit canned vegetables. If you do use them, rinse them well to decrease the sodium.  When eating at a restaurant, ask that your food be prepared with less salt, or no salt if possible. WHAT FOODS CAN I EAT? Seek help from a dietitian for  individual calorie needs. Grains Whole grain or whole wheat bread. Brown rice. Whole grain or whole wheat pasta. Quinoa, bulgur, and whole grain cereals. Low-sodium cereals. Corn or whole wheat flour tortillas. Whole grain cornbread. Whole grain crackers. Low-sodium crackers. Vegetables Fresh or frozen vegetables (raw, steamed, roasted, or grilled). Low-sodium or reduced-sodium tomato and vegetable juices. Low-sodium or reduced-sodium tomato sauce and paste. Low-sodium or reduced-sodium canned vegetables.  Fruits All fresh, canned (in natural juice), or frozen fruits. Meat and Other Protein Products Ground beef (85% or leaner), grass-fed beef, or beef trimmed of fat. Skinless chicken or [place]. Ground chicken or [place]. Pork trimmed of fat. All fish and seafood. Eggs. Dried beans, peas, or lentils. Unsalted nuts and seeds. Unsalted canned beans. Dairy Low-fat dairy products, such as skim or 1% milk, 2% or reduced-fat cheeses, low-fat ricotta or cottage cheese, or plain low-fat yogurt. Low-sodium or reduced-sodium cheeses. Fats and Oils Tub margarines without trans fats. Light or reduced-fat mayonnaise and salad dressings (reduced sodium). Avocado. Safflower, olive, or canola oils. Natural peanut or almond butter. Other Unsalted popcorn and pretzels. The items listed above may not be a complete list of recommended foods or beverages. Contact your dietitian for more options. WHAT FOODS ARE NOT RECOMMENDED? Grains White bread. White pasta. White rice. Refined cornbread. Bagels and croissants. Crackers that contain trans fat. Vegetables Creamed or fried vegetables. Vegetables in a cheese sauce. Regular canned vegetables. Regular canned tomato sauce and paste. Regular tomato and vegetable juices. Fruits Dried fruits. Canned fruit in  light or heavy syrup. Fruit juice. Meat and Other Protein Products Fatty cuts of meat. Ribs, chicken wings, bacon, sausage, bologna, salami, chitterlings, fatback, hot  dogs, bratwurst, and packaged luncheon meats. Salted nuts and seeds. Canned beans with salt. Dairy Whole or 2% milk, cream, half-and-half, and cream cheese. Whole-fat or sweetened yogurt. Full-fat cheeses or blue cheese. Nondairy creamers and whipped toppings. Processed cheese, cheese spreads, or cheese curds. Condiments Onion and garlic salt, seasoned salt, table salt, and sea salt. Canned and packaged gravies. Worcestershire sauce. Tartar sauce. Barbecue sauce. Teriyaki sauce. Soy sauce, including reduced sodium. Steak sauce. Fish sauce. Oyster sauce. Cocktail sauce. Horseradish. Ketchup and mustard. Meat flavorings and tenderizers. Bouillon cubes. Hot sauce. Tabasco sauce. Marinades. Taco seasonings. Relishes. Fats and Oils Butter, stick margarine, lard, shortening, ghee, and bacon fat. Coconut, palm kernel, or palm oils. Regular salad dressings. Other Pickles and olives. Salted popcorn and pretzels. The items listed above may not be a complete list of foods and beverages to avoid. Contact your dietitian for more information. WHERE CAN I FIND MORE INFORMATION? National Heart, Lung, and Blood Institute: CablePromo.itwww.nhlbi.nih.gov/health/health-topics/topics/dash/   This information is not intended to replace advice given to you by your health care provider. Make sure you discuss any questions you have with your health care provider.   Document Released: 04/28/2011 Document Revised: 05/30/2014 Document Reviewed: 03/13/2013 Elsevier Interactive Patient Education [employer]2016 Elsevier Inc.

## 2015-11-11 NOTE — Progress Notes (Signed)
Patient presents to clinic today for follow-up of chronic medical issues.  Hypertension -- Patient endorses taking carvedilol and amlodipine as directed. IS also on Hydralazine 50 mg, prescribed TID. Has been taking BID. Patient denies chest pain, palpitations, lightheadedness, dizziness, vision changes or frequent headaches.   BP Readings from Last 3 Encounters:  11/11/15 110/72  09/29/15 153/83  09/02/15 132/78   Anxiety -- Is taking Citalopram and Lorazepam as directed. Denies breakthrough symptoms with this regimen. Is sleeping well at night. Denies SI/HI.   Past Medical History  Diagnosis Date  . Chest pain   . HTN (hypertension)   . Hyperlipemia   . DJD (degenerative joint disease)   . BPH (benign prostatic hypertrophy)   . Obesity   . OSA (obstructive sleep apnea)   . Hypercholesteremia   . Chronic back pain   . Anxiety   . Depression     Current Outpatient Prescriptions on File Prior to Visit  Medication Sig Dispense Refill  . aspirin 81 MG tablet Take 81 mg by mouth daily.    . Cholecalciferol (VITAMIN D-3) 1000 UNITS CAPS Take 2,000 Units by mouth daily.    . fluticasone (FLONASE) 50 MCG/ACT nasal spray Place 1 spray into both nostrils daily. 16 g 5  . methocarbamol (ROBAXIN) 500 MG tablet TAKE 1 TABLET (500 MG TOTAL) BY MOUTH 4 (FOUR) TIMES DAILY. 120 tablet 1  . Multiple Vitamin (MULTIVITAMIN WITH MINERALS) TABS tablet Take 1 tablet by mouth daily.    . Omega-3 Fatty Acids (FISH OIL BURP-LESS PO) Take 1 tablet by mouth daily.      Marland Kitchen oxycodone (ROXICODONE) 30 MG immediate release tablet Take 15-30 mg by mouth every 6 (six) hours as needed for pain.   0   No current facility-administered medications on file prior to visit.    Allergies  Allergen Reactions  . Morphine And Related Anaphylaxis and Shortness Of Breath  . Montelukast Other (See Comments)    unknown  . Neurontin [Gabapentin] Other (See Comments)    delusional  . Wellbutrin [Bupropion]  Palpitations    Insomnia    Family History  Problem Relation Age of Onset  . Hypertension Mother   . Hypertension Father     Social History   Social History  . Marital Status: Married    Spouse Name: N/A  . Number of Children: 1  . Years of Education: N/A   Occupational History  . Disabled    Social History Main Topics  . Smoking status: Former Research scientist (life sciences)  . Smokeless tobacco: Never Used  . Alcohol Use: No  . Drug Use: No  . Sexual Activity: Not Asked   Other Topics Concern  . None   Social History Narrative   Review of Systems - See HPI.  All other ROS are negative.  BP 110/72 mmHg  Pulse 56  Temp(Src) 98.4 F (36.9 C) (Oral)  Resp 16  Ht _0  (1.93 m)  Wt 240 lb (108.863 kg)  BMI 29.23 kg/m2  SpO2 98%  Physical Exam  Constitutional: He is oriented to person, place, and time and well-developed, well-nourished, and in no distress.  HENT:  Head: Normocephalic and atraumatic.  Eyes: Conjunctivae are normal.  Cardiovascular: Normal rate, regular rhythm, normal heart sounds and intact distal pulses.   Pulmonary/Chest: Effort normal and breath sounds normal. No respiratory distress. He has no wheezes. He has no rales. He exhibits no tenderness.  Neurological: He is alert and oriented to person, place, and time.  Skin: Skin is warm and dry. No rash noted.  Psychiatric: Affect normal.  Vitals reviewed.   Recent Results (from the past 2160 hour(s))  Lipase, blood     Status: None   Collection Time: 09/28/15  4:12 PM  Result Value Ref Range   Lipase 32 11 - 51 U/L  Comprehensive metabolic panel     Status: Abnormal   Collection Time: 09/28/15  4:12 PM  Result Value Ref Range   Sodium 142 135 - 145 mmol/L   Potassium 3.5 3.5 - 5.1 mmol/L   Chloride 104 101 - 111 mmol/L   CO2 28 22 - 32 mmol/L   Glucose, Bld 128 (H) 65 - 99 mg/dL   BUN 8 6 - 20 mg/dL   Creatinine, Ser 1.26 (H) 0.61 - 1.24 mg/dL   Calcium 9.8 8.9 - 10.3 mg/dL   Total Protein 6.5 6.5 - 8.1  g/dL   Albumin 3.6 3.5 - 5.0 g/dL   AST 28 15 - 41 U/L   ALT 54 17 - 63 U/L   Alkaline Phosphatase 81 38 - 126 U/L   Total Bilirubin 0.6 0.3 - 1.2 mg/dL   GFR calc non Af Amer 59 (L) >60 mL/min   GFR calc Af Amer >60 >60 mL/min    Comment: (NOTE) The eGFR has been calculated using the CKD EPI equation. This calculation has not been validated in all clinical situations. eGFR's persistently <60 mL/min signify possible Chronic Kidney Disease.    Anion gap 10 5 - 15  CBC     Status: None   Collection Time: 09/28/15  4:12 PM  Result Value Ref Range   WBC 9.9 4.0 - 10.5 K/uL   RBC 4.43 4.22 - 5.81 MIL/uL   Hemoglobin 13.3 13.0 - 17.0 g/dL   HCT 39.4 39.0 - 52.0 %   MCV 88.9 78.0 - 100.0 fL   MCH 30.0 26.0 - 34.0 pg   MCHC 33.8 30.0 - 36.0 g/dL   RDW 12.4 11.5 - 15.5 %   Platelets 240 150 - 400 K/uL    Assessment/Plan: HTN (hypertension) BP well controlled. Will continue current regimen. Hydralazine BID. Continue DASH diet and staying active. FU 6 months.  Anxiety and depression Doing extremely well. Will continue current medication regimen. FU 6 months.     Leeanne Rio, PA-C

## 2015-11-11 NOTE — Progress Notes (Signed)
Pre visit review using our clinic review tool, if applicable. No additional management support is needed unless otherwise documented below in the visit note/SLS  

## 2015-11-12 NOTE — Assessment & Plan Note (Signed)
BP well controlled. Will continue current regimen. Hydralazine BID. Continue DASH diet and staying active. FU 6 months.

## 2015-11-12 NOTE — Assessment & Plan Note (Signed)
Doing extremely well. Will continue current medication regimen. FU 6 months.

## 2015-11-16 ENCOUNTER — Emergency Department (HOSPITAL_BASED_OUTPATIENT_CLINIC_OR_DEPARTMENT_OTHER): Payer: Medicare Other

## 2015-11-16 ENCOUNTER — Emergency Department (HOSPITAL_BASED_OUTPATIENT_CLINIC_OR_DEPARTMENT_OTHER)
Admission: EM | Admit: 2015-11-16 | Discharge: 2015-11-16 | Disposition: A | Discharge status: Home / Self Care | Payer: Medicare Other | Attending: Emergency Medicine | Admitting: Emergency Medicine

## 2015-11-16 ENCOUNTER — Encounter (HOSPITAL_BASED_OUTPATIENT_CLINIC_OR_DEPARTMENT_OTHER): Payer: Self-pay

## 2015-11-16 DIAGNOSIS — M545 Low back pain: Secondary | ICD-10-CM

## 2015-11-16 DIAGNOSIS — E7800 Pure hypercholesterolemia, unspecified: Secondary | ICD-10-CM | POA: Diagnosis not present

## 2015-11-16 DIAGNOSIS — F329 Major depressive disorder, single episode, unspecified: Secondary | ICD-10-CM | POA: Diagnosis not present

## 2015-11-16 DIAGNOSIS — Z79899 Other long term (current) drug therapy: Secondary | ICD-10-CM | POA: Diagnosis not present

## 2015-11-16 DIAGNOSIS — Z87891 Personal history of nicotine dependence: Secondary | ICD-10-CM | POA: Insufficient documentation

## 2015-11-16 DIAGNOSIS — R531 Weakness: Secondary | ICD-10-CM | POA: Diagnosis not present

## 2015-11-16 DIAGNOSIS — E669 Obesity, unspecified: Secondary | ICD-10-CM | POA: Insufficient documentation

## 2015-11-16 DIAGNOSIS — R197 Diarrhea, unspecified: Secondary | ICD-10-CM | POA: Diagnosis not present

## 2015-11-16 DIAGNOSIS — E785 Hyperlipidemia, unspecified: Secondary | ICD-10-CM | POA: Diagnosis not present

## 2015-11-16 DIAGNOSIS — Z6829 Body mass index (BMI) 29.0-29.9, adult: Secondary | ICD-10-CM | POA: Diagnosis not present

## 2015-11-16 DIAGNOSIS — M5134 Other intervertebral disc degeneration, thoracic region: Secondary | ICD-10-CM | POA: Diagnosis not present

## 2015-11-16 DIAGNOSIS — Z7982 Long term (current) use of aspirin: Secondary | ICD-10-CM | POA: Diagnosis not present

## 2015-11-16 DIAGNOSIS — Z981 Arthrodesis status: Secondary | ICD-10-CM | POA: Diagnosis not present

## 2015-11-16 DIAGNOSIS — I10 Essential (primary) hypertension: Secondary | ICD-10-CM | POA: Diagnosis not present

## 2015-11-16 DIAGNOSIS — R112 Nausea with vomiting, unspecified: Secondary | ICD-10-CM

## 2015-11-16 LAB — BASIC METABOLIC PANEL
Anion gap: 8 (ref 5–15)
BUN: 9 mg/dL (ref 6–20)
CALCIUM: 9.6 mg/dL (ref 8.9–10.3)
CO2: 26 mmol/L (ref 22–32)
CREATININE: 1.14 mg/dL (ref 0.61–1.24)
Chloride: 107 mmol/L (ref 101–111)
Glucose, Bld: 106 mg/dL — ABNORMAL HIGH (ref 65–99)
Potassium: 3.7 mmol/L (ref 3.5–5.1)
SODIUM: 141 mmol/L (ref 135–145)

## 2015-11-16 LAB — CBC WITH DIFFERENTIAL/PLATELET
BASOS PCT: 0 %
Basophils Absolute: 0 10*3/uL (ref 0.0–0.1)
EOS ABS: 0.1 10*3/uL (ref 0.0–0.7)
EOS PCT: 1 %
HEMATOCRIT: 37.4 % — AB (ref 39.0–52.0)
Hemoglobin: 13 g/dL (ref 13.0–17.0)
Lymphocytes Relative: 11 %
Lymphs Abs: 1 10*3/uL (ref 0.7–4.0)
MCH: 30.8 pg (ref 26.0–34.0)
MCHC: 34.8 g/dL (ref 30.0–36.0)
MCV: 88.6 fL (ref 78.0–100.0)
MONO ABS: 0.5 10*3/uL (ref 0.1–1.0)
MONOS PCT: 6 %
Neutro Abs: 7.9 10*3/uL — ABNORMAL HIGH (ref 1.7–7.7)
Neutrophils Relative %: 82 %
PLATELETS: 232 10*3/uL (ref 150–400)
RBC: 4.22 MIL/uL (ref 4.22–5.81)
RDW: 12.4 % (ref 11.5–15.5)
WBC: 9.6 10*3/uL (ref 4.0–10.5)

## 2015-11-16 MED ORDER — HYDROMORPHONE HCL 1 MG/ML IJ SOLN
1.0000 mg | Freq: Once | INTRAMUSCULAR | Status: AC
  Administered 2015-11-16: 1 mg via INTRAVENOUS
  Filled 2015-11-16: qty 1

## 2015-11-16 MED ORDER — ONDANSETRON HCL 4 MG/2ML IJ SOLN
4.0000 mg | Freq: Once | INTRAMUSCULAR | Status: AC
  Administered 2015-11-16: 4 mg via INTRAVENOUS
  Filled 2015-11-16: qty 2

## 2015-11-16 MED ORDER — SODIUM CHLORIDE 0.9 % IV BOLUS (SEPSIS)
1000.0000 mL | Freq: Once | INTRAVENOUS | Status: AC
  Administered 2015-11-16: 1000 mL via INTRAVENOUS

## 2015-11-16 MED ORDER — DIAZEPAM 5 MG/ML IJ SOLN
2.5000 mg | Freq: Once | INTRAMUSCULAR | Status: AC
  Administered 2015-11-16: 2.5 mg via INTRAVENOUS
  Filled 2015-11-16: qty 2

## 2015-11-16 NOTE — ED Notes (Addendum)
C/o chronic back pain-states oxycodone is not working-NAD-presents to triage in w/c-when asked states he is in a pain management with appt tomorrow-requesting pain injection "demerol or dilaudid"

## 2015-11-16 NOTE — ED Notes (Signed)
Patient transported to X-ray 

## 2015-11-16 NOTE — Discharge Instructions (Signed)
Continue taking your home pain medications as prescribed as needed for pain relief.  Continue drinking fluids at home to remain hydrated. I recommend eating a bland diet for the next few days until he nausea, vomiting diarrhea have resolved. Follow-up with your pain management doctor at your scheduled appointment tomorrow. I recommend discussing management of your chronic back pain during your appointment and possible need for breakthrough pain medication. Please return to the Emergency Department if symptoms worsen or new onset of fever, numbness, tingling, groin numbness, urinary retention, abdominal pain, loss of bowel or bladder, weakness.

## 2015-11-16 NOTE — ED Notes (Signed)
PA at bedside.

## 2015-11-16 NOTE — ED Provider Notes (Signed)
CSN: [id number]     Arrival date & time 11/16/15  1220 History   First MD Initiated Contact with Patient 11/16/15 1240     Chief Complaint  Patient presents with  . Back Pain     (Consider location/radiation/quality/duration/timing/severity/associated sxs/prior Treatment) HPI   Patient is a 60 year old male with past medical history of hypertension, degenerative joint disease, BPH, chronic back pain who presents the ED with report of worsening back pain. Patient reports over the past 4 days he has had worsening chronic lower back pain. He reports having a constant sharp throbbing pain to his lower back which she reports radiates down his left leg. Patient reports pain is consistent with his chronic back pain but notes it is significantly worse. Denies any recent fall, trauma or injury however patient reports the pain worsened after driving in the car for a few hours when he went to visit family. He states he has been taking his home prescription of oxycodone without relief. Patient reports he is followed by pain management clinic and is scheduled to have an appointment tomorrow. Patient also reports yesterday he began having nausea with 2 episodes of NBNB vomiting. Patient also reports having one episode of nonbloody diarrhea this morning. Patient denies any known sick contacts. Denies fever, chills, headache, cough, shortness of breath, chest pain, abdominal pain, urinary symptoms. Pt denies fever, numbness, tingling, saddle anesthesia, urinary retention, loss of bowel or bladder, weakness, IVDU, cancer or recent spinal manipulation.   Past Medical History  Diagnosis Date  . Chest pain   . HTN (hypertension)   . Hyperlipemia   . DJD (degenerative joint disease)   . BPH (benign prostatic hypertrophy)   . Obesity   . OSA (obstructive sleep apnea)   . Hypercholesteremia   . Chronic back pain   . Anxiety   . Depression    Past Surgical History  Procedure Laterality Date  . Back surgery       Lspine w/Hardware  . Prostatectomy    . Cervical spine surgery      w/Hardware  . Laparotomy N/A 01/08/2015    Procedure: EXPLORATORY LAPAROTOMY;  Surgeon: Manus Rudd, MD;  Location: [facility] OR;  Service: General;  Laterality: N/A;  . Application of wound vac N/A 01/08/2015    Procedure: APPLICATION OF WOUND VAC;  Surgeon: Manus Rudd, MD;  Location: [facility] OR;  Service: General;  Laterality: N/A;  . Wound debridement N/A 01/11/2015    Procedure: DEBRIDEMENT WOUND AND VAC DRESSING CHANGE;  Surgeon: Manus Rudd, MD;  Location: [facility] OR;  Service: General;  Laterality: N/A;  . Cardiac catheterization N/A 01/13/2015    Procedure: Left Heart Cath and Coronary Angiography;  Surgeon: Corky Crafts, MD;  Location: [facility] INVASIVE CV LAB;  Service: Cardiovascular;  Laterality: N/A;   Family History  Problem Relation Age of Onset  . Hypertension Mother   . Hypertension Father    Social History  Substance Use Topics  . Smoking status: Former Games developer  . Smokeless tobacco: Never Used  . Alcohol Use: No    Review of Systems  Gastrointestinal: Positive for nausea, vomiting and diarrhea.  Musculoskeletal: Positive for back pain.  Neurological: Positive for weakness (generalized).  All other systems reviewed and are negative.     Allergies  Morphine and related; Montelukast; Neurontin; and Wellbutrin  Home Medications   Prior to Admission medications   Medication Sig Start Date End Date Taking? Authorizing Provider  amLODipine (NORVASC) 10 MG tablet Take 1 tablet (10 mg total)  by mouth daily. 11/11/15   Waldon Merl, PA-C  aspirin 81 MG tablet Take 81 mg by mouth daily.    Historical Provider, MD  carvedilol (COREG) 6.25 MG tablet TAKE 1 TABLET BY MOUTH TWICE DAILY WITH A MEAL 11/11/15   Waldon Merl, PA-C  Cholecalciferol (VITAMIN D-3) 1000 UNITS CAPS Take 2,000 Units by mouth daily.    Historical Provider, MD  citalopram (CELEXA) 40 MG tablet TAKE 1 TABLET BY MOUTH EVERY DAY FOR MOOD  11/11/15   Waldon Merl, PA-C  famotidine (PEPCID) 20 MG tablet TAKE 1 TABLET (20 MG TOTAL) BY MOUTH 2 (TWO) TIMES DAILY. 11/11/15   Waldon Merl, PA-C  fluticasone (FLONASE) 50 MCG/ACT nasal spray Place 1 spray into both nostrils daily. 07/13/15 11/10/16  Waldon Merl, PA-C  hydrALAZINE (APRESOLINE) 50 MG tablet Take 1 tablet (50 mg total) by mouth 2 (two) times daily. 11/11/15   Waldon Merl, PA-C  LORazepam (ATIVAN) 1 MG tablet Take 1 tablet (1 mg total) by mouth every 8 (eight) hours as needed. for anxiety 11/11/15   Waldon Merl, PA-C  methocarbamol (ROBAXIN) 500 MG tablet TAKE 1 TABLET (500 MG TOTAL) BY MOUTH 4 (FOUR) TIMES DAILY. 10/14/15   Pearline Cables, MD  Multiple Vitamin (MULTIVITAMIN WITH MINERALS) TABS tablet Take 1 tablet by mouth daily. 02/11/15   Bernadene Person, NP  Omega-3 Fatty Acids (FISH OIL BURP-LESS PO) Take 1 tablet by mouth daily.      Historical Provider, MD  ondansetron (ZOFRAN ODT) 4 MG disintegrating tablet Take 1 tablet (4 mg total) by mouth every 8 (eight) hours as needed for nausea or vomiting. 11/11/15   Waldon Merl, PA-C  oxycodone (ROXICODONE) 30 MG immediate release tablet Take 15-30 mg by mouth every 6 (six) hours as needed for pain.  08/23/15   Historical Provider, MD   BP 130/71 mmHg  Pulse 54  Temp(Src) 98.1 F (36.7 C) (Oral)  Resp 16  Ht 6\' 2"  (1.88 m)  Wt 104.327 kg  BMI 29.52 kg/m2  SpO2 95% Physical Exam  Constitutional: He is oriented to person, place, and time. He appears well-developed and well-nourished. No distress.  HENT:  Head: Normocephalic and atraumatic.  Mouth/Throat: Uvula is midline, oropharynx is clear and moist and mucous membranes are normal. No oropharyngeal exudate, posterior oropharyngeal edema, posterior oropharyngeal erythema or tonsillar abscesses.  Eyes: Conjunctivae and EOM are normal. Right eye exhibits no discharge. Left eye exhibits no discharge. No scleral icterus.  Neck: Normal range of motion.  Neck supple.  Cardiovascular: Normal rate, regular rhythm, normal heart sounds and intact distal pulses.   Pulmonary/Chest: Effort normal and breath sounds normal. No respiratory distress. He has no wheezes. He has no rales. He exhibits no tenderness.  Abdominal: Soft. Bowel sounds are normal. He exhibits no distension and no mass. There is no tenderness. There is no rebound and no guarding.  Musculoskeletal: Normal range of motion. He exhibits tenderness. He exhibits no edema.  No midline C tenderness. TTP over lower thoracic and lumbar spine. Full range of motion of neck and back. Full range of motion of bilateral upper and lower extremities, with 5/5 strength. Negative straight leg raise bilaterally. Sensation intact. 2+ radial and PT pulses. Cap refill <2 seconds.   Lymphadenopathy:    He has no cervical adenopathy.  Neurological: He is alert and oriented to person, place, and time.  Skin: Skin is warm and dry. He is not diaphoretic.  Nursing note and  vitals reviewed.   ED Course  Procedures (including critical care time) Labs Review Labs Reviewed  CBC WITH DIFFERENTIAL/PLATELET - Abnormal; Notable for the following:    HCT 37.4 (*)    Neutro Abs 7.9 (*)    All other components within normal limits  BASIC METABOLIC PANEL - Abnormal; Notable for the following:    Glucose, Bld 106 (*)    All other components within normal limits    Imaging Review Dg Thoracic Spine 2 View  11/16/2015  CLINICAL DATA:  Chronic back pain, on pain management, back pain throughout today, prior cervical and lumbar spine surgery EXAM: THORACIC SPINE 2 VIEWS COMPARISON:  Chest radiographs 09/28/2015 FINDINGS: 12 pairs of ribs. Prior lower cervical fusion C5-C7. Vertebral body heights maintained without fracture or subluxation. Minimal scattered degenerative disc disease changes with endplate spur formation. Visualized posterior ribs unremarkable. IMPRESSION: Minimal degenerative disc disease changes lumbar  spine. No acute abnormalities. Electronically Signed   By: Ulyses Southward M.D.   On: 11/16/2015 13:47   Dg Lumbar Spine Complete  11/16/2015  CLINICAL DATA:  Chronic back pain, on pain management, back pain throughout day, prior lumbar and cervical spine surgery EXAM: LUMBAR SPINE - COMPLETE 4+ VIEW COMPARISON:  MRI lumbar spine 02/06/2014 FINDINGS: Five non-rib-bearing lumbar vertebra. Prior L5 laminectomy with posterior fusion L5-S1. Vertebral body heights maintained without fracture or subluxation. Few tiny scattered endplate spurs. No bone destruction or spondylolysis. SI joints symmetric. IMPRESSION: Prior L5-S1 posterior fusion and L5 laminectomy. No acute bony abnormalities. Electronically Signed   By: Ulyses Southward M.D.   On: 11/16/2015 13:46   I have personally reviewed and evaluated these images and lab results as part of my medical decision-making.   EKG Interpretation None      MDM   Final diagnoses:  Low back pain without sciatica, unspecified back pain laterality  Non-intractable vomiting with nausea, vomiting of unspecified type  Diarrhea, unspecified type   Patient presents with worsening chronic back pain. He also reports having 2 episodes of nausea, vomiting and diarrhea that started yesterday. Denies fever or abdominal pain. VSS. Exam revealed tenderness to palpation over midline lower thoracic and lumbar spine, full range of motion of neck and back, negative straight leg raise bilaterally. No neuro deficits. Patient given IV fluids, Zofran and pain meds. Lumbar and thoracic spinal x-rays showed prior L5 -S1 posterior fusion and L5 laminectomy, minimal degenerative disc disease, otherwise no acute abnormalities. Labs unremarkable. On reevaluation patient reports his nausea has significantly improved. Patient able tolerate by mouth in the ED. I suspect patient's symptoms are likely due to viral gastroenteritis and do not feel any further workup or imaging is warranted at this time. I  do not suspect acute surgical abdomen, appendicitis, diverticulitis, bowel obstruction, bowel perforation at this time. Plan to discharge patient home with symptomatically treatment, discussed return precautions with pt.  Patient reports his back pain has mildly improved and is requesting another dose of pain meds. Pt given second dose of pain meds. On reevaluation patient reports his back pain has significantly improved. Patient able to stand and ambulate without assistance. DTRs normal. Plan to discharge patient home. Advised patient to continue taking his home prescriptions of oxycodone as prescribed as he had for pain relief. Advised patient to follow up with his pain management physician at his scheduled appointment tomorrow. Discussed return precautions with patient.    Satira Sark [place], [place] 11/16/15 1637  Benjiman Core, MD 11/17/15 [phone]

## 2015-11-17 DIAGNOSIS — G894 Chronic pain syndrome: Secondary | ICD-10-CM | POA: Diagnosis not present

## 2015-11-17 DIAGNOSIS — M961 Postlaminectomy syndrome, not elsewhere classified: Secondary | ICD-10-CM | POA: Diagnosis not present

## 2015-11-25 MED FILL — FAMOTIDINE 20 MG TABLET: 20 | 90 days supply | Qty: 180 | Fill #0

## 2015-11-25 MED FILL — CITALOPRAM HBR 40 MG TABLET: 40 | 30 days supply | Qty: 30 | Fill #2

## 2015-11-25 MED FILL — METHOCARBAMOL 500 MG TABLET: 500 | 30 days supply | Qty: 120 | Fill #1

## 2015-11-26 MED FILL — FLUTICASONE PROP 50 MCG SPR: 50 | 30 days supply | Qty: 16 | Fill #0

## 2015-12-01 ENCOUNTER — Encounter (HOSPITAL_BASED_OUTPATIENT_CLINIC_OR_DEPARTMENT_OTHER): Payer: Self-pay

## 2015-12-01 ENCOUNTER — Emergency Department (HOSPITAL_BASED_OUTPATIENT_CLINIC_OR_DEPARTMENT_OTHER)
Admission: EM | Admit: 2015-12-01 | Discharge: 2015-12-02 | Disposition: A | Discharge status: Home / Self Care | Payer: Medicare Other | Attending: Emergency Medicine | Admitting: Emergency Medicine

## 2015-12-01 DIAGNOSIS — Z5181 Encounter for therapeutic drug level monitoring: Secondary | ICD-10-CM | POA: Insufficient documentation

## 2015-12-01 DIAGNOSIS — Z87891 Personal history of nicotine dependence: Secondary | ICD-10-CM | POA: Insufficient documentation

## 2015-12-01 DIAGNOSIS — Y999 Unspecified external cause status: Secondary | ICD-10-CM | POA: Diagnosis not present

## 2015-12-01 DIAGNOSIS — F329 Major depressive disorder, single episode, unspecified: Secondary | ICD-10-CM | POA: Insufficient documentation

## 2015-12-01 DIAGNOSIS — T63004A Toxic effect of unspecified snake venom, undetermined, initial encounter: Secondary | ICD-10-CM

## 2015-12-01 DIAGNOSIS — T63001A Toxic effect of unspecified snake venom, accidental (unintentional), initial encounter: Secondary | ICD-10-CM | POA: Diagnosis not present

## 2015-12-01 DIAGNOSIS — Y929 Unspecified place or not applicable: Secondary | ICD-10-CM | POA: Insufficient documentation

## 2015-12-01 DIAGNOSIS — Z79899 Other long term (current) drug therapy: Secondary | ICD-10-CM | POA: Insufficient documentation

## 2015-12-01 DIAGNOSIS — W5911XA Bitten by nonvenomous snake, initial encounter: Secondary | ICD-10-CM | POA: Insufficient documentation

## 2015-12-01 DIAGNOSIS — E669 Obesity, unspecified: Secondary | ICD-10-CM | POA: Diagnosis not present

## 2015-12-01 DIAGNOSIS — Y939 Activity, unspecified: Secondary | ICD-10-CM | POA: Insufficient documentation

## 2015-12-01 DIAGNOSIS — S91351A Open bite, right foot, initial encounter: Secondary | ICD-10-CM | POA: Diagnosis not present

## 2015-12-01 DIAGNOSIS — Z7982 Long term (current) use of aspirin: Secondary | ICD-10-CM | POA: Insufficient documentation

## 2015-12-01 DIAGNOSIS — Z6829 Body mass index (BMI) 29.0-29.9, adult: Secondary | ICD-10-CM | POA: Diagnosis not present

## 2015-12-01 DIAGNOSIS — E785 Hyperlipidemia, unspecified: Secondary | ICD-10-CM | POA: Diagnosis not present

## 2015-12-01 DIAGNOSIS — I10 Essential (primary) hypertension: Secondary | ICD-10-CM | POA: Insufficient documentation

## 2015-12-01 HISTORY — DX: Long term (current) use of opiate analgesic: Z79.891

## 2015-12-01 LAB — COMPREHENSIVE METABOLIC PANEL
ALT: 17 U/L (ref 17–63)
AST: 21 U/L (ref 15–41)
Albumin: 4.5 g/dL (ref 3.5–5.0)
Alkaline Phosphatase: 66 U/L (ref 38–126)
Anion gap: 12 (ref 5–15)
BILIRUBIN TOTAL: 0.9 mg/dL (ref 0.3–1.2)
BUN: 12 mg/dL (ref 6–20)
CALCIUM: 10.3 mg/dL (ref 8.9–10.3)
CO2: 25 mmol/L (ref 22–32)
CREATININE: 1.36 mg/dL — AB (ref 0.61–1.24)
Chloride: 100 mmol/L — ABNORMAL LOW (ref 101–111)
GFR, EST NON AFRICAN AMERICAN: 54 mL/min — AB (ref >60)
Glucose, Bld: 96 mg/dL (ref 65–99)
Potassium: 3.5 mmol/L (ref 3.5–5.1)
Sodium: 137 mmol/L (ref 135–145)
TOTAL PROTEIN: 7.8 g/dL (ref 6.5–8.1)

## 2015-12-01 LAB — CBC WITH DIFFERENTIAL/PLATELET
Basophils Absolute: 0 10*3/uL (ref 0.0–0.1)
Basophils Relative: 0 %
Eosinophils Absolute: 0 10*3/uL (ref 0.0–0.7)
Eosinophils Relative: 0 %
HEMATOCRIT: 39.5 % (ref 39.0–52.0)
Hemoglobin: 13.9 g/dL (ref 13.0–17.0)
LYMPHS ABS: 1.2 10*3/uL (ref 0.7–4.0)
Lymphocytes Relative: 13 %
MCH: 31 pg (ref 26.0–34.0)
MCHC: 35.2 g/dL (ref 30.0–36.0)
MCV: 88.2 fL (ref 78.0–100.0)
MONOS PCT: 5 %
Monocytes Absolute: 0.5 10*3/uL (ref 0.1–1.0)
NEUTROS ABS: 7.5 10*3/uL (ref 1.7–7.7)
NEUTROS PCT: 82 %
Platelets: 274 10*3/uL (ref 150–400)
RBC: 4.48 MIL/uL (ref 4.22–5.81)
RDW: 12.5 % (ref 11.5–15.5)
WBC: 9.3 10*3/uL (ref 4.0–10.5)

## 2015-12-01 LAB — PROTIME-INR
INR: 0.98 (ref 0.00–1.49)
PROTHROMBIN TIME: 13.2 s (ref 11.6–15.2)

## 2015-12-01 LAB — APTT: aPTT: 33 seconds (ref 24–37)

## 2015-12-01 LAB — FIBRINOGEN: Fibrinogen: 417 mg/dL (ref 204–475)

## 2015-12-01 MED ORDER — HYDROMORPHONE HCL 1 MG/ML IJ SOLN
1.0000 mg | Freq: Once | INTRAMUSCULAR | Status: AC
  Administered 2015-12-01: 1 mg via INTRAVENOUS
  Filled 2015-12-01: qty 1

## 2015-12-01 MED ORDER — DIPHENHYDRAMINE HCL 50 MG/ML IJ SOLN
25.0000 mg | Freq: Once | INTRAMUSCULAR | Status: AC
  Administered 2015-12-01: 25 mg via INTRAVENOUS
  Filled 2015-12-01: qty 1

## 2015-12-01 MED ORDER — ACETAMINOPHEN 500 MG PO TABS
1000.0000 mg | ORAL_TABLET | Freq: Once | ORAL | Status: AC
  Administered 2015-12-01: 1000 mg via ORAL
  Filled 2015-12-01: qty 2

## 2015-12-01 MED ORDER — METHYLPREDNISOLONE SODIUM SUCC 125 MG IJ SOLR
125.0000 mg | Freq: Once | INTRAMUSCULAR | Status: AC
  Administered 2015-12-01: 125 mg via INTRAVENOUS
  Filled 2015-12-01: qty 2

## 2015-12-01 MED ORDER — OXYCODONE HCL 5 MG PO TABS
15.0000 mg | ORAL_TABLET | Freq: Once | ORAL | Status: AC
  Administered 2015-12-01: 15 mg via ORAL
  Filled 2015-12-01: qty 3

## 2015-12-01 MED ORDER — FAMOTIDINE IN NACL 20-0.9 MG/50ML-% IV SOLN
20.0000 mg | Freq: Two times a day (BID) | INTRAVENOUS | Status: DC
  Administered 2015-12-01: 20 mg via INTRAVENOUS
  Filled 2015-12-01: qty 50

## 2015-12-01 MED ORDER — ONDANSETRON HCL 4 MG/2ML IJ SOLN
4.0000 mg | Freq: Once | INTRAMUSCULAR | Status: AC | PRN
  Administered 2015-12-01: 4 mg via INTRAVENOUS
  Filled 2015-12-01: qty 2

## 2015-12-01 MED ORDER — LORAZEPAM 2 MG/ML IJ SOLN
1.0000 mg | Freq: Once | INTRAMUSCULAR | Status: AC
  Administered 2015-12-01: 1 mg via INTRAVENOUS
  Filled 2015-12-01: qty 1

## 2015-12-01 MED ORDER — IBUPROFEN 800 MG PO TABS
800.0000 mg | ORAL_TABLET | Freq: Once | ORAL | Status: AC
Start: 2015-12-01 — End: 2015-12-01
  Administered 2015-12-01: 800 mg via ORAL
  Filled 2015-12-01: qty 1

## 2015-12-01 NOTE — ED Notes (Signed)
Called to room by pt wife for c/o nausea. Pt is alert, oriented, hyperventilating, ruddy coloration to face, mildly diaphoretic. Pt c/o throat feeling "tight, like it's closing", sat remains appropriate, no stridor or wheeze noted on auscultation. Dr. Adela Lank to room to assess with no acute findings. Awaiting orders.

## 2015-12-01 NOTE — ED Provider Notes (Signed)
CSN: [id number]     Arrival date & time 12/01/15  2000 History  By signing my name below, I, [name], attest that this documentation has been prepared under the direction and in the presence of Melene Plan, DO.  Electronically Signed: [name], ED Scribe. 12/01/2015. 8:26 PM.   Chief Complaint  Patient presents with  . Snake Bite   Patient is a 60 y.o. male presenting with animal bite. The history is provided by the patient. No language interpreter was used.  Animal Bite Contact animal:  Snake Location:  Foot Foot injury location:  R heel Time since incident:  1 hour Pain details:    Quality:  Unable to specify   Severity:  No pain   Timing:  Unable to specify   Progression:  Unable to specify Incident location:  Home Provoked: unprovoked   Notifications:  None Animal's rabies vaccination status:  Unknown Animal in possession: no   Tetanus status:  Up to date Relieved by:  None tried Worsened by:  Nothing tried Ineffective treatments:  None tried Associated symptoms: no fever, no numbness, no rash and no swelling    HPI Comments: Jared [name] is a 60 y.o. male with a PMHx of HTN, HLD, DJD, BPH, and hypercholesteremia who presents to the Emergency Department complaining of sudden onset snake bite to the right heel onset 1 hour PTA. Pt reports that he was in his yard checking a racoon trap that had went off when he noticed blood on his heel and saw two small bites, but he did not feel the bite to the area, and did not see the snake. Pt notes that he is currently on pain management medications which may be why he could not feel the bite. He reports that he has associated mild nausea and light-headedness since the incident. No episodes of emesis. Pt denies any other known bites. Pt is not complaining of any other areas of pain or symptoms. Tetanus is UTD.  Past Medical History  Diagnosis Date  . Chest pain   . HTN (hypertension)   . Hyperlipemia   . DJD  (degenerative joint disease)   . BPH (benign prostatic hypertrophy)   . Obesity   . OSA (obstructive sleep apnea)   . Hypercholesteremia   . Chronic back pain   . Anxiety   . Depression   . Chronic prescription opiate use    Past Surgical History  Procedure Laterality Date  . Back surgery      Lspine w/Hardware  . Prostatectomy    . Cervical spine surgery      w/Hardware  . Laparotomy N/A 01/08/2015    Procedure: EXPLORATORY LAPAROTOMY;  Surgeon: Manus Rudd, MD;  Location: [facility];  Service: General;  Laterality: N/A;  . Application of wound vac N/A 01/08/2015    Procedure: APPLICATION OF WOUND VAC;  Surgeon: Manus Rudd, MD;  Location: [facility] OR;  Service: General;  Laterality: N/A;  . Wound debridement N/A 01/11/2015    Procedure: DEBRIDEMENT WOUND AND VAC DRESSING CHANGE;  Surgeon: Manus Rudd, MD;  Location: [facility] OR;  Service: General;  Laterality: N/A;  . Cardiac catheterization N/A 01/13/2015    Procedure: Left Heart Cath and Coronary Angiography;  Surgeon: Corky Crafts, MD;  Location: [facility] INVASIVE CV LAB;  Service: Cardiovascular;  Laterality: N/A;   Family History  Problem Relation Age of Onset  . Hypertension Mother   . Hypertension Father    Social History  Substance Use Topics  .  Smoking status: Former Games developer  . Smokeless tobacco: Never Used  . Alcohol Use: No    Review of Systems  Constitutional: Negative for fever and chills.  HENT: Negative for congestion and facial swelling.   Eyes: Negative for discharge and visual disturbance.  Respiratory: Negative for shortness of breath.   Cardiovascular: Negative for chest pain and palpitations.  Gastrointestinal: Positive for nausea. Negative for vomiting, abdominal pain and diarrhea.  Musculoskeletal: Negative for myalgias and arthralgias.  Skin: Negative for color change and rash.  Neurological: Positive for light-headedness. Negative for tremors, syncope, numbness and headaches.  Psychiatric/Behavioral:  Negative for confusion and dysphoric mood.   Allergies  Morphine and related; Montelukast; Neurontin; and Wellbutrin  Home Medications   Prior to Admission medications   Medication Sig Start Date End Date Taking? Authorizing Provider  amLODipine (NORVASC) 10 MG tablet Take 1 tablet (10 mg total) by mouth daily. 11/11/15   Waldon Merl, PA-C  aspirin 81 MG tablet Take 81 mg by mouth daily.    Historical Provider, MD  carvedilol (COREG) 6.25 MG tablet TAKE 1 TABLET BY MOUTH TWICE DAILY WITH A MEAL 11/11/15   Waldon Merl, PA-C  Cholecalciferol (VITAMIN D-3) 1000 UNITS CAPS Take 2,000 Units by mouth daily.    Historical Provider, MD  citalopram (CELEXA) 40 MG tablet TAKE 1 TABLET BY MOUTH EVERY DAY FOR MOOD 11/11/15   Waldon Merl, PA-C  famotidine (PEPCID) 20 MG tablet TAKE 1 TABLET (20 MG TOTAL) BY MOUTH 2 (TWO) TIMES DAILY. 11/11/15   Waldon Merl, PA-C  fluticasone (FLONASE) 50 MCG/ACT nasal spray Place 1 spray into both nostrils daily. 07/13/15 11/10/16  Waldon Merl, PA-C  hydrALAZINE (APRESOLINE) 50 MG tablet Take 1 tablet (50 mg total) by mouth 2 (two) times daily. 11/11/15   Waldon Merl, PA-C  LORazepam (ATIVAN) 1 MG tablet Take 1 tablet (1 mg total) by mouth every 8 (eight) hours as needed. for anxiety 11/11/15   Waldon Merl, PA-C  methocarbamol (ROBAXIN) 500 MG tablet TAKE 1 TABLET (500 MG TOTAL) BY MOUTH 4 (FOUR) TIMES DAILY. 10/14/15   Pearline Cables, MD  Multiple Vitamin (MULTIVITAMIN WITH MINERALS) TABS tablet Take 1 tablet by mouth daily. 02/11/15   Bernadene Person, NP  Omega-3 Fatty Acids (FISH OIL BURP-LESS PO) Take 1 tablet by mouth daily.      Historical Provider, MD  ondansetron (ZOFRAN ODT) 4 MG disintegrating tablet Take 1 tablet (4 mg total) by mouth every 8 (eight) hours as needed for nausea or vomiting. 11/11/15   Waldon Merl, PA-C  oxycodone (ROXICODONE) 30 MG immediate release tablet Take 15-30 mg by mouth every 6 (six) hours as needed  for pain.  08/23/15   Historical Provider, MD   BP 144/73 mmHg  Pulse 65  Temp(Src) 98.1 F (36.7 C) (Oral)  Resp 20  Ht  (1.905 m)  Wt 235 lb (106.595 kg)  BMI 29.37 kg/m2  SpO2 98%   Physical Exam  Constitutional: He is oriented to person, place, and time. He appears well-developed and well-nourished.  HENT:  Head: Normocephalic and atraumatic.  Eyes: EOM are normal. Pupils are equal, round, and reactive to light.  Neck: Normal range of motion. Neck supple.  Cardiovascular: Normal rate, regular rhythm and normal heart sounds.  Exam reveals no gallop and no friction rub.   No murmur heard. Pulmonary/Chest: Effort normal. No respiratory distress.  Musculoskeletal: Normal range of motion.  Neurological: He is alert and oriented  to person, place, and time.  Skin: Skin is warm and dry. No rash noted.  Two superficial abrasions to the posterior medial aspect of his right ankle.  Psychiatric: He has a normal mood and affect. His behavior is normal.  Nursing note and vitals reviewed.  ED Course  Procedures (including critical care time)  DIAGNOSTIC STUDIES: Oxygen Saturation is 100% on RA, normal by my interpretation.   COORDINATION OF CARE: 8:21 PM-Discussed next steps with pt including monitoring, CBC, and CMP. Pt verbalized understanding and is agreeable with the plan.   Labs Review Labs Reviewed  COMPREHENSIVE METABOLIC PANEL - Abnormal; Notable for the following:    Chloride 100 (*)    Creatinine, Ser 1.36 (*)    GFR calc non Af Amer 54 (*)    All other components within normal limits  CBC WITH DIFFERENTIAL/PLATELET  PROTIME-INR  APTT  FIBRINOGEN    Imaging Review No results found.  I have personally reviewed and evaluated these images and lab results as part of my medical decision-making.   EKG Interpretation None      MDM   Final diagnoses:  Snake bite, undetermined intent, initial encounter    60 yo M with ? Snake bite.  No signs of swelling,  coags normal.  Patient with no significant symptoms.  Will obs in the ED. Tdap up to date.    I personally performed the services described in this documentation, which was scribed in my presence. The recorded information has been reviewed and is accurate.  The patients results and plan were reviewed and discussed.   Any x-rays performed were independently reviewed by myself.   Differential diagnosis were considered with the presenting HPI.  Medications  famotidine (PEPCID) IVPB 20 mg premix (0 mg Intravenous Stopped 12/01/15 2154)  acetaminophen (TYLENOL) tablet 1,000 mg (1,000 mg Oral Given 12/01/15 2127)  ibuprofen (ADVIL,MOTRIN) tablet 800 mg (800 mg Oral Given 12/01/15 2127)  ondansetron (ZOFRAN) injection 4 mg (4 mg Intravenous Given 12/01/15 2103)  methylPREDNISolone sodium succinate (SOLU-MEDROL) 125 mg/2 mL injection 125 mg (125 mg Intravenous Given 12/01/15 2119)  diphenhydrAMINE (BENADRYL) injection 25 mg (25 mg Intravenous Given 12/01/15 2119)  LORazepam (ATIVAN) injection 1 mg (1 mg Intravenous Given 12/01/15 2116)  oxyCODONE (Oxy IR/ROXICODONE) immediate release tablet 15 mg (15 mg Oral Given 12/01/15 2222)  HYDROmorphone (DILAUDID) injection 1 mg (1 mg Intravenous Given 12/01/15 2325)    Filed Vitals:   12/01/15 2205 12/01/15 2230 12/01/15 2300 12/01/15 2330  BP:  135/72 119/66 144/73  Pulse: 62 66 64 65  Temp:      TempSrc:      Resp:      Height:      Weight:      SpO2: 96% 98% 98% 98%    Final diagnoses:  Snake bite, undetermined intent, initial encounter        Melene Plan, DO 12/01/15 2348

## 2015-12-01 NOTE — ED Notes (Addendum)
Pt resting quietly in bed. Breathing even and unlabored, in NAD. Ruddy discoloration of face and neck mostly resolved. No swelling, erythema, or noticeable change to puncture wound or surrounding area. Pt c/o chronic back pain, states "tylenol and ibuprofen won't do anything because I take oxycodone every day. You'll have to give me Dilaudid."

## 2015-12-01 NOTE — ED Notes (Signed)
Snake bite to right heel approx 1 hour PTA

## 2015-12-02 NOTE — Discharge Instructions (Signed)
If this was a snake bite, this was likely a dry bite with knowing that information. We have discussed your case with poison control. They recommended you notice mild bruising, swelling to keep your leg elevated above the level of your heart. If you develop significant bruising or swelling, pain in this area, warmth or redness, fever, please return to the hospital.    Snake Bite Snakes may be poisonous (venomous) or nonpoisonous (nonvenomous). A bite from a nonvenomous snake may cause a wound to the skin and possibly to the deeper tissues beneath the skin. A venomous snake will cause a wound and may also inject poison (venom) into the wound.  The effects of snake venom vary depending on the type of snake. In some cases, the effects can be extremely serious or even deadly. A bite from a venomous snake is a medical emergency. Treatment may require the use of antivenom medicine. SYMPTOMS  Symptoms of a snake bite vary depending on the type of snake, whether the snake is venomous, and the severity of the bite. Symptoms for both a venomous or nonvenomous snake may include:   Pain, redness, and swelling at the site of the bite.  Skin discoloration at the site of the bite.   A feeling of nervousness. Symptoms of a venomous snake bite may also include:   Increasing pain and swelling.  Severe anxiety or confusion.  Blood blisters or purple spots in the bite area.   Nausea and vomiting.   Numbness or tingling.   Muscle weakness.   Excessive fatigue or drowsiness.  Excessive sweating.   Difficulty breathing.   Blurred vision.   Bruising and bleeding at the site of the bite.  Feeling faint or light-headed. In some cases, symptoms do not develop until a few hours after the bite.  DIAGNOSIS  This condition may be diagnosed based on symptoms and a physical exam. Your health care provider will examine the bite area and ask for details about the snake to help determine whether it is  venomous. You may also have tests, including blood tests. TREATMENT  Treatment depends on the severity of the bite and whether the snake is venomous.  Treatment for nonvenomous snake bites may involve basic wound care. This often includes cleaning the wound and applying a bandage (dressing). In some cases, antibiotic medicine or a tetanus shot may be given.  Treatment for venomous snake bites may include antivenom medicine in addition to wound care. This medicine needs to be given as soon as possible after the bite. Other treatments may be needed to help control symptoms as they develop. You may need to stay in a hospital so your condition can be monitored. HOME CARE INSTRUCTIONS  Wound Care  Follow instructions from your health care provider about how to take care of your wound. Make sure you:   Wash your hands with soap and water before you change your dressing. If soap and water are not available, use hand sanitizer.  Change your dressing as told by your health care provider.  Keep the bite area clean and dry. Wash the bite area daily with soap and water or an antiseptic as told by your health care provider.  Check your wound every day for signs of infection. Watch for:   Redness, swelling, or pain that is getting worse.   Fluid, blood, or pus.   If you develop blistering at the site of the bite, protect the blisters from breaking. Do not attempt to open a blister. Medicines  Take or apply over-the-counter and prescription medicines only as told by your health care provider.   If you were prescribed an antibiotic, take or apply it as told by your health care provider. Do not stop using the antibiotic even if your condition improves. General Instructions  Keep the affected area raised (elevated) above the level of your heart while you are sitting or lying down, if possible.  Keep all follow-up visits as told by your health care provider. This is important. SEEK MEDICAL  CARE IF:   You have increased redness, swelling, or pain at the site of your wound.  You have fluid, blood, or pus coming from your wound.  You have a fever. SEEK IMMEDIATE MEDICAL CARE IF:   You develop blood blisters or purple spots in the bite area.   You have nausea or vomiting.   You have numbness or tingling.   You have excessive sweating.   You have trouble breathing.   You have vision problems.   You feel very confused.  You feel faint or light-headed.    This information is not intended to replace advice given to you by your health care provider. Make sure you discuss any questions you have with your health care provider.   Document Released: 05/06/2000 Document Revised: 01/28/2015 Document Reviewed: 09/24/2014 Elsevier Interactive Patient Education [employer]

## 2015-12-02 NOTE — ED Provider Notes (Signed)
1:00 AM  Assumed care from Dr. Adela Lank.  Pt is a 60 y.o. with history of hypertension, hyperlipidemia, chronic back pain who presents to the emergency department with possible snake bite to the right heel. Patient has 2 small abrasions to the right heel that are very superficial. No puncture wounds. Very low likelihood that this was actually a snake bite. He does not remember seeing a snake, does not remember feeling a snake bite him but states when he came in from outside he noticed these abrasions to his heel. He has no swelling, erythema, ecchymosis or pain in this area. He has received pain medication the emergency department but this was because of his chronic back pain. On my reexamination patient is very well-appearing without any systemic symptoms. He is hemodynamically stable. He is 2+ DP pulses bilaterally. No sign of superimposed infection. Compartments are soft and nontender. No joint effusion. Have discussed patient's case with poison control who recommended observing him for 6 hours after possible bite. He has had normal labs. They did not feel he needed to have his labs repeated if he was not symptomatic. I feel patient is safe to be discharged home. He is comfortable with this plan. Poison control will contact him at home for follow-up. Discussed with patient and wife return precautions. They verbalize understanding and are comfortable with this plan. Patient able to ambulate out of the emergency department without difficulty.  Layla Maw Peyton Spengler, DO 12/02/15 [phone]

## 2015-12-07 ENCOUNTER — Telehealth: Payer: Self-pay | Admitting: Physician Assistant

## 2015-12-07 MED FILL — hydrALAZINE HCL 50 MG TABS: 50 | 30 days supply | Qty: 90 | Fill #2

## 2015-12-07 MED FILL — LORazepam 1 MG TABS: 1 | 30 days supply | Qty: 90 | Fill #1

## 2015-12-07 NOTE — Telephone Encounter (Signed)
Please Advise

## 2015-12-07 NOTE — Telephone Encounter (Signed)
Caller name: Relationship to patient: Self Can be reached: [phone]    Reason for call: States he was informed to report back to provider with BP readings. States he has been monitoring for over a month and his BP has run above 160/95. States he has started taking 3 tablets per now (150mg s). Wanted to let provider know. Plse adv

## 2015-12-08 NOTE — Telephone Encounter (Signed)
I am assuming he is referring to his hydralazine. Continue TID dosing. Check BP over the next week and write down. If still elevated he needs office appointment. Otherwise FU as scheduled.

## 2015-12-08 NOTE — Telephone Encounter (Signed)
Advised pt of below recommendation and he voices understanding. Med list updated.

## 2015-12-08 NOTE — Telephone Encounter (Signed)
Left message for pt to return my call.

## 2015-12-09 NOTE — ED Provider Notes (Signed)
Medical screening examination/treatment/procedure(s) were performed by non-physician practitioner and as supervising physician I was immediately available for consultation/collaboration.   EKG Interpretation   Date/Time:  Monday Sep 28 2015 16:00:25 EDT Ventricular Rate:  69 PR Interval:  134 QRS Duration: 92 QT Interval:  458 QTC Calculation: 490 R Axis:   39 Text Interpretation:  Normal sinus rhythm Prolonged QT Abnormal ECG No  significant change was found Confirmed by CAMPOS  MD, Caryn Bee ([id number]) on  09/29/2015 9:56:27 AM       Jacalyn Lefevre, MD 12/09/15 1006

## 2015-12-11 DIAGNOSIS — F4541 Pain disorder exclusively related to psychological factors: Secondary | ICD-10-CM | POA: Diagnosis not present

## 2015-12-11 DIAGNOSIS — M961 Postlaminectomy syndrome, not elsewhere classified: Secondary | ICD-10-CM | POA: Diagnosis not present

## 2015-12-15 ENCOUNTER — Emergency Department (HOSPITAL_BASED_OUTPATIENT_CLINIC_OR_DEPARTMENT_OTHER)
Admission: EM | Admit: 2015-12-15 | Discharge: 2015-12-15 | Disposition: A | Discharge status: Home / Self Care | Payer: Medicare Other | Attending: Emergency Medicine | Admitting: Emergency Medicine

## 2015-12-15 ENCOUNTER — Emergency Department (HOSPITAL_BASED_OUTPATIENT_CLINIC_OR_DEPARTMENT_OTHER): Payer: Medicare Other

## 2015-12-15 ENCOUNTER — Encounter (HOSPITAL_BASED_OUTPATIENT_CLINIC_OR_DEPARTMENT_OTHER): Payer: Self-pay

## 2015-12-15 DIAGNOSIS — M545 Low back pain: Secondary | ICD-10-CM | POA: Diagnosis not present

## 2015-12-15 DIAGNOSIS — Y929 Unspecified place or not applicable: Secondary | ICD-10-CM | POA: Insufficient documentation

## 2015-12-15 DIAGNOSIS — S3992XA Unspecified injury of lower back, initial encounter: Secondary | ICD-10-CM | POA: Diagnosis not present

## 2015-12-15 DIAGNOSIS — Z87891 Personal history of nicotine dependence: Secondary | ICD-10-CM | POA: Insufficient documentation

## 2015-12-15 DIAGNOSIS — N182 Chronic kidney disease, stage 2 (mild): Secondary | ICD-10-CM | POA: Insufficient documentation

## 2015-12-15 DIAGNOSIS — I5022 Chronic systolic (congestive) heart failure: Secondary | ICD-10-CM | POA: Insufficient documentation

## 2015-12-15 DIAGNOSIS — S161XXA Strain of muscle, fascia and tendon at neck level, initial encounter: Secondary | ICD-10-CM | POA: Diagnosis not present

## 2015-12-15 DIAGNOSIS — Y999 Unspecified external cause status: Secondary | ICD-10-CM | POA: Diagnosis not present

## 2015-12-15 DIAGNOSIS — W01198A Fall on same level from slipping, tripping and stumbling with subsequent striking against other object, initial encounter: Secondary | ICD-10-CM | POA: Insufficient documentation

## 2015-12-15 DIAGNOSIS — G8929 Other chronic pain: Secondary | ICD-10-CM | POA: Insufficient documentation

## 2015-12-15 DIAGNOSIS — S199XXA Unspecified injury of neck, initial encounter: Secondary | ICD-10-CM | POA: Diagnosis present

## 2015-12-15 DIAGNOSIS — Y939 Activity, unspecified: Secondary | ICD-10-CM | POA: Diagnosis not present

## 2015-12-15 DIAGNOSIS — I482 Chronic atrial fibrillation: Secondary | ICD-10-CM | POA: Insufficient documentation

## 2015-12-15 DIAGNOSIS — I130 Hypertensive heart and chronic kidney disease with heart failure and stage 1 through stage 4 chronic kidney disease, or unspecified chronic kidney disease: Secondary | ICD-10-CM | POA: Diagnosis not present

## 2015-12-15 DIAGNOSIS — W19XXXA Unspecified fall, initial encounter: Secondary | ICD-10-CM

## 2015-12-15 MED ORDER — CYCLOBENZAPRINE HCL 10 MG PO TABS: 10.0000 mg | ORAL_TABLET | Freq: Two times a day (BID) | ORAL | 0 refills | Status: DC | PRN

## 2015-12-15 MED ORDER — OXYCODONE-ACETAMINOPHEN 5-325 MG PO TABS
2.0000 | ORAL_TABLET | Freq: Once | ORAL | Status: AC
  Administered 2015-12-15: 2 via ORAL
  Filled 2015-12-15: qty 2

## 2015-12-15 MED ORDER — KETOROLAC TROMETHAMINE 15 MG/ML IJ SOLN
15.0000 mg | Freq: Once | INTRAMUSCULAR | Status: AC
  Administered 2015-12-15: 15 mg via INTRAMUSCULAR
  Filled 2015-12-15: qty 1

## 2015-12-15 MED ORDER — METHYLPREDNISOLONE 4 MG PO TBPK: ORAL_TABLET | ORAL | 0 refills | Status: DC

## 2015-12-15 MED ORDER — HYDROXYZINE HCL 25 MG PO TABS
50.0000 mg | ORAL_TABLET | Freq: Once | ORAL | Status: AC
  Administered 2015-12-15: 50 mg via ORAL
  Filled 2015-12-15: qty 2

## 2015-12-15 MED ORDER — DEXAMETHASONE SODIUM PHOSPHATE 10 MG/ML IJ SOLN
10.0000 mg | Freq: Once | INTRAMUSCULAR | Status: AC
  Administered 2015-12-15: 10 mg via INTRAMUSCULAR
  Filled 2015-12-15: qty 1

## 2015-12-15 MED FILL — METHYLPREDNISOLONE 4 MG TAB: 4 | 6 days supply | Qty: 21 | Fill #0

## 2015-12-15 MED FILL — CYCLOBENZAPRINE 10 MG TAB: 10 | 5 days supply | Qty: 10 | Fill #0

## 2015-12-15 NOTE — ED Notes (Signed)
Pt in xray

## 2015-12-15 NOTE — ED Triage Notes (Signed)
Pt states he tripped and fell into the wall tonight about an hour ago.  He states he hit his head on the wall and then fell onto his left side.  He is c/o neck pain and lower back pain.  He states he took some of his pain medication at home, "but it is not working and now the anxiety is setting in."

## 2015-12-15 NOTE — ED Notes (Signed)
Back from xray, c/o worse back pain, family at [facility].

## 2015-12-15 NOTE — ED Notes (Signed)
Given extra pillow for b/w knees for comfort, lying on L side.

## 2015-12-15 NOTE — ED Notes (Signed)
Dr. Horton into room.  

## 2015-12-15 NOTE — ED Provider Notes (Addendum)
[facility] Provider Note   CSN: [id number] Arrival date & time: 12/15/15  7829  First Provider Contact:  First MD Initiated Contact with Patient 12/15/15 0240        History   Chief Complaint Chief Complaint  Patient presents with  . Fall    HPI Jared [name] is a 60 y.o. male.  HPI  This is a 60 year old male with a history of chronic back pain and opiate use, anxiety, and hypertension who presents after fall. Patient reports that he tripped in his hallway and fell forward. He states that he hit his head on the drywall before turning and falling to the ground. Denies loss of consciousness. Denies vomiting. He is not on any anticoagulants. He reports neck and lower back pain. Denies any weakness or tingling to lower extremities. He reports baseline numbness of the left leg. History of chronic low back pain with sciatica. Denies any difficulty with his bowel or bladder. Currently he rates his pain at 9 out of 10. He reports that he took Tylenol arthritis at home with minimal relief. Patient also reports increasing anxiety. He took a lorazepam at home prior to arrival.  Past Medical History:  Diagnosis Date  . Anxiety   . BPH (benign prostatic hypertrophy)   . Chest pain   . Chronic back pain   . Chronic prescription opiate use   . Depression   . DJD (degenerative joint disease)   . HTN (hypertension)   . Hypercholesteremia   . Hyperlipemia   . Obesity   . OSA (obstructive sleep apnea)     Patient Active Problem List   Diagnosis Date Noted  . Generalized anxiety disorder 09/03/2015  . Depression with anxiety 07/22/2015  . Psychosomatic factor in physical condition 07/22/2015  . Anxiety and depression 07/22/2015  . Pain in the chest   . PRES (posterior reversible encephalopathy syndrome) 05/18/2015  . Provoked seizures (HCC) 05/18/2015  . Abdominal pain   . Leukocytosis   . Chronic atrial fibrillation (HCC)   . Chronic systolic heart failure (HCC)   .  Hyperkalemia   . Chronic kidney disease   . Thrombocytopenia (HCC)   . Paroxysmal atrial fibrillation (HCC)   . Acute systolic heart failure (HCC)   . Pulmonary hypertension (HCC)   . Chronic back pain 01/18/2015  . NSTEMI (non-ST elevated myocardial infarction) (HCC) 01/13/2015  . Cardiogenic shock (HCC)   . CKD Stage 2 (GFR 72 ml/min) 02/04/2014  . Mixed hyperlipidemia 05/22/2013  . Prediabetes 05/22/2013  . OSA on CPAP 05/22/2013  . Reflux esophagitis 05/22/2013  . Testosterone deficiency 05/22/2013  . Vitamin D deficiency 05/22/2013  . DDD (degenerative disc disease), lumbar 05/22/2013  . Chronic pain syndrome 05/22/2013  . HTN (hypertension) 01/20/2011    Past Surgical History:  Procedure Laterality Date  . APPLICATION OF WOUND VAC N/A 01/08/2015   Procedure: APPLICATION OF WOUND VAC;  Surgeon: Manus Rudd, MD;  Location: [facility] OR;  Service: General;  Laterality: N/A;  . BACK SURGERY     Lspine w/Hardware  . CARDIAC CATHETERIZATION N/A 01/13/2015   Procedure: Left Heart Cath and Coronary Angiography;  Surgeon: Corky Crafts, MD;  Location: [facility] INVASIVE CV LAB;  Service: Cardiovascular;  Laterality: N/A;  . CERVICAL SPINE SURGERY     w/Hardware  . LAPAROTOMY N/A 01/08/2015   Procedure: EXPLORATORY LAPAROTOMY;  Surgeon: Manus Rudd, MD;  Location: [facility] OR;  Service: General;  Laterality: N/A;  . PROSTATECTOMY    . WOUND DEBRIDEMENT  N/A 01/11/2015   Procedure: DEBRIDEMENT WOUND AND VAC DRESSING CHANGE;  Surgeon: Manus Rudd, MD;  Location: [facility] OR;  Service: General;  Laterality: N/A;       Home Medications    Prior to Admission medications   Medication Sig Start Date End Date Taking? Authorizing Provider  amLODipine (NORVASC) 10 MG tablet Take 1 tablet (10 mg total) by mouth daily. 11/11/15   Waldon Merl, PA-C  aspirin 81 MG tablet Take 81 mg by mouth daily.    Historical Provider, MD  carvedilol (COREG) 6.25 MG tablet TAKE 1 TABLET BY MOUTH TWICE DAILY WITH A  MEAL 11/11/15   Waldon Merl, PA-C  Cholecalciferol (VITAMIN D-3) 1000 UNITS CAPS Take 2,000 Units by mouth daily.    Historical Provider, MD  citalopram (CELEXA) 40 MG tablet TAKE 1 TABLET BY MOUTH EVERY DAY FOR MOOD 11/11/15   Waldon Merl, PA-C  cyclobenzaprine (FLEXERIL) 10 MG tablet Take 1 tablet (10 mg total) by mouth 2 (two) times daily as needed for muscle spasms. 12/15/15   Shon Baton, MD  famotidine (PEPCID) 20 MG tablet TAKE 1 TABLET (20 MG TOTAL) BY MOUTH 2 (TWO) TIMES DAILY. 11/11/15   Waldon Merl, PA-C  fluticasone (FLONASE) 50 MCG/ACT nasal spray Place 1 spray into both nostrils daily. 07/13/15 11/10/16  Waldon Merl, PA-C  hydrALAZINE (APRESOLINE) 50 MG tablet Take 1 tablet (50 mg total) by mouth 2 (two) times daily. Patient taking differently: Take 50 mg by mouth 3 (three) times daily.  11/11/15   Waldon Merl, PA-C  LORazepam (ATIVAN) 1 MG tablet Take 1 tablet (1 mg total) by mouth every 8 (eight) hours as needed. for anxiety 11/11/15   Waldon Merl, PA-C  methocarbamol (ROBAXIN) 500 MG tablet TAKE 1 TABLET (500 MG TOTAL) BY MOUTH 4 (FOUR) TIMES DAILY. 10/14/15   Pearline Cables, MD  methylPREDNISolone (MEDROL DOSEPAK) 4 MG TBPK tablet Take as directed on packet 12/15/15   Shon Baton, MD  Multiple Vitamin (MULTIVITAMIN WITH MINERALS) TABS tablet Take 1 tablet by mouth daily. 02/11/15   Bernadene Person, NP  Omega-3 Fatty Acids (FISH OIL BURP-LESS PO) Take 1 tablet by mouth daily.      Historical Provider, MD  ondansetron (ZOFRAN ODT) 4 MG disintegrating tablet Take 1 tablet (4 mg total) by mouth every 8 (eight) hours as needed for nausea or vomiting. 11/11/15   Waldon Merl, PA-C  oxycodone (ROXICODONE) 30 MG immediate release tablet Take 15-30 mg by mouth every 6 (six) hours as needed for pain.  08/23/15   Historical Provider, MD    Family History Family History  Problem Relation Age of Onset  . Hypertension Mother   . Hypertension Father      Social History Social History  Substance Use Topics  . Smoking status: Former Games developer  . Smokeless tobacco: Never Used  . Alcohol use No     Allergies   Morphine and related; Montelukast; Neurontin [gabapentin]; and Wellbutrin [bupropion]   Review of Systems Review of Systems  Respiratory: Negative for shortness of breath.   Cardiovascular: Negative for chest pain.  Genitourinary: Negative for dysuria.  Musculoskeletal: Positive for back pain and neck pain.  Neurological: Negative for syncope, weakness, numbness and headaches.  All other systems reviewed and are negative.    Physical Exam Updated Vital Signs BP 120/73 (BP Location: Right Arm)   Pulse 61   Temp 98.3 F (36.8 C) (Oral)   Resp 18  Ht 6\' 2"  (1.88 m)   Wt 234 lb (106.1 kg)   SpO2 99%   BMI 30.04 kg/m   Physical Exam  Constitutional: He is oriented to person, place, and time. He appears well-developed and well-nourished. No distress.  HENT:  Head: Normocephalic and atraumatic.  Eyes: Pupils are equal, round, and reactive to light.  Neck: Normal range of motion.  Tenderness palpation lower C-spine, no step-off or deformity noted, also tenderness to palpation over the right paraspinous musculature of the C-spine  Cardiovascular: Normal rate, regular rhythm and normal heart sounds.   No murmur heard. Pulmonary/Chest: Effort normal and breath sounds normal. No respiratory distress. He has no wheezes.  Musculoskeletal: He exhibits no edema.  Tenderness to palpation lower lumbar spine over the midline and left paraspinous muscle region, no step-off or deformity noted, prior well-healed surgical incisions noted  Neurological: He is alert and oriented to person, place, and time. He has normal reflexes.  5 out of 5 strength with plantar and dorsiflexion of the bilateral feet, no clonus, negative straight leg raise  Skin: Skin is warm and dry.  Psychiatric: He has a normal mood and affect.  Nursing note and  vitals reviewed.    ED Treatments / Results  Labs (all labs ordered are listed, but only abnormal results are displayed) Labs Reviewed - No data to display  EKG  EKG Interpretation None       Radiology Dg Lumbar Spine Complete  Result Date: 12/15/2015 CLINICAL DATA:  60 year old male with fall and low back pain. EXAM: LUMBAR SPINE - COMPLETE 4+ VIEW COMPARISON:  Lumbar spine radiograph dated 11/16/2015 FINDINGS: There is no definite acute fracture or subluxation of the lumbar spine. The bones are osteopenic. Mild multilevel chronic changes with mild loss of vertebral body heights most prominent at L3 and L4. There is mild anterior wedging of the L3. L5-S1 posterior fixation hardware noted. The visualized transverse and spinous processes appear intact. The soft tissues appear unremarkable. IMPRESSION: No acute/traumatic lumbar spine pathology. Electronically Signed   By: Elgie Collard M.D.   On: 12/15/2015 03:42  Ct Cervical Spine Wo Contrast  Result Date: 12/15/2015 CLINICAL DATA:  60 year old male with fall EXAM: CT CERVICAL SPINE WITHOUT CONTRAST TECHNIQUE: Multidetector CT imaging of the cervical spine was performed without intravenous contrast. Multiplanar CT image reconstructions were also generated. COMPARISON:  Cervical spine CT dated 02/07/2015 FINDINGS: There is no acute fracture or subluxation of the cervical spine.There is mild multilevel degenerative changes. C5-C7 disc spacer and anterior fusion hardware noted. There is partial ankylosis of the C4-C5.The odontoid and spinous processes are intact.There is normal anatomic alignment of the C1-C2 lateral masses. The visualized soft tissues appear unremarkable. IMPRESSION: No acute/traumatic cervical spine pathology. Electronically Signed   By: Elgie Collard M.D.   On: 12/15/2015 04:07   Procedures Procedures (including critical care time)  Medications Ordered in ED Medications  dexamethasone (DECADRON) injection 10 mg  (not administered)  hydrOXYzine (ATARAX/VISTARIL) tablet 50 mg (50 mg Oral Given 12/15/15 0313)  ketorolac (TORADOL) 15 MG/ML injection 15 mg (15 mg Intramuscular Given 12/15/15 0314)  oxyCODONE-acetaminophen (PERCOCET/ROXICET) 5-325 MG per tablet 2 tablet (2 tablets Oral Given 12/15/15 0407)     Initial Impression / Assessment and Plan / ED Course  I have reviewed the triage vital signs and the nursing notes.  Pertinent labs & imaging results that were available during my care of the patient were reviewed by me and considered in my medical decision making (see chart for  details).  Clinical Course    Patient presents following a fall. He reports mechanical fall where he fell forward exacerbating chronic neck and back pain. He is nontoxic on exam. No focal deficits. Reports that he took Tylenol arthritis at home. He did not disclose that he is in pain management and receives chronic oxycodone as well. When I ask him about this, he stated that he "also took my oxycodone."  CT neck and plain films of lumbar spine obtained. Patient given IM Toradol and Percocet. He was given Vistaril for anxiety. On multiple occasions, he repeatedly asked for stronger pain medication. He stated "Dilaudid usually works for me." I do not feel at this time that further narcotic medication is appropriate. I discussed this with him and his wife. His imaging is reassuring. He likely has an acute exacerbation of his chronic pain. Given that he is on chronic opiates, he is follow-up with his primary physician for titration of medication. We'll place on Medrol dose pack for inflammation and a short course of muscle relaxer for muscle spasm given tenderness over paraspinous muscle regions.  [place] narcotic database reviewed.  After history, exam, and medical workup I feel the patient has been appropriately medically screened and is safe for discharge home. Pertinent diagnoses were discussed with the patient. Patient was given return  precautions.   Final Clinical Impressions(s) / ED Diagnoses   Final diagnoses:  Fall, initial encounter  Cervical strain, initial encounter  Chronic low back pain    New Prescriptions New Prescriptions   CYCLOBENZAPRINE (FLEXERIL) 10 MG TABLET    Take 1 tablet (10 mg total) by mouth 2 (two) times daily as needed for muscle spasms.   METHYLPREDNISOLONE (MEDROL DOSEPAK) 4 MG TBPK TABLET    Take as directed on packet     Shon Baton, MD 12/15/15 1610    Shon Baton, MD 12/15/15 [phone]

## 2015-12-15 NOTE — Discharge Instructions (Signed)
You were seen today for a fall. Your x-rays are negative. There are no fractures. You need to follow-up with your primary physician and/or pain specialist for further pain medications.

## 2015-12-15 NOTE — ED Notes (Signed)
Dr. Horton at [facility].  

## 2015-12-16 ENCOUNTER — Emergency Department (HOSPITAL_BASED_OUTPATIENT_CLINIC_OR_DEPARTMENT_OTHER)
Admission: EM | Admit: 2015-12-16 | Discharge: 2015-12-16 | Disposition: A | Discharge status: Home / Self Care | Payer: Medicare Other | Attending: Emergency Medicine | Admitting: Emergency Medicine

## 2015-12-16 ENCOUNTER — Other Ambulatory Visit: Payer: Self-pay | Admitting: Physician Assistant

## 2015-12-16 ENCOUNTER — Encounter (HOSPITAL_BASED_OUTPATIENT_CLINIC_OR_DEPARTMENT_OTHER): Payer: Self-pay | Admitting: *Deleted

## 2015-12-16 DIAGNOSIS — E785 Hyperlipidemia, unspecified: Secondary | ICD-10-CM | POA: Diagnosis not present

## 2015-12-16 DIAGNOSIS — Z7982 Long term (current) use of aspirin: Secondary | ICD-10-CM | POA: Insufficient documentation

## 2015-12-16 DIAGNOSIS — I130 Hypertensive heart and chronic kidney disease with heart failure and stage 1 through stage 4 chronic kidney disease, or unspecified chronic kidney disease: Secondary | ICD-10-CM | POA: Diagnosis not present

## 2015-12-16 DIAGNOSIS — M545 Low back pain: Secondary | ICD-10-CM | POA: Diagnosis present

## 2015-12-16 DIAGNOSIS — M5442 Lumbago with sciatica, left side: Secondary | ICD-10-CM | POA: Insufficient documentation

## 2015-12-16 DIAGNOSIS — N182 Chronic kidney disease, stage 2 (mild): Secondary | ICD-10-CM | POA: Insufficient documentation

## 2015-12-16 DIAGNOSIS — Z87891 Personal history of nicotine dependence: Secondary | ICD-10-CM | POA: Diagnosis not present

## 2015-12-16 DIAGNOSIS — R200 Anesthesia of skin: Secondary | ICD-10-CM | POA: Diagnosis not present

## 2015-12-16 DIAGNOSIS — F329 Major depressive disorder, single episode, unspecified: Secondary | ICD-10-CM | POA: Insufficient documentation

## 2015-12-16 DIAGNOSIS — G8929 Other chronic pain: Secondary | ICD-10-CM

## 2015-12-16 DIAGNOSIS — I5022 Chronic systolic (congestive) heart failure: Secondary | ICD-10-CM | POA: Diagnosis not present

## 2015-12-16 DIAGNOSIS — Z79899 Other long term (current) drug therapy: Secondary | ICD-10-CM | POA: Insufficient documentation

## 2015-12-16 DIAGNOSIS — I482 Chronic atrial fibrillation: Secondary | ICD-10-CM | POA: Diagnosis not present

## 2015-12-16 HISTORY — DX: Opioid dependence, uncomplicated: F11.20

## 2015-12-16 MED ORDER — NAPROXEN 500 MG PO TABS: 500.0000 mg | ORAL_TABLET | Freq: Two times a day (BID) | ORAL | 0 refills | Status: DC

## 2015-12-16 MED ORDER — OXYCODONE HCL 5 MG PO TABS
15.0000 mg | ORAL_TABLET | Freq: Once | ORAL | Status: AC
Start: 2015-12-16 — End: 2015-12-16
  Administered 2015-12-16: 15 mg via ORAL
  Filled 2015-12-16: qty 3

## 2015-12-16 MED ORDER — HYDROXYZINE HCL 25 MG PO TABS
50.0000 mg | ORAL_TABLET | Freq: Once | ORAL | Status: AC
  Administered 2015-12-16: 50 mg via ORAL

## 2015-12-16 MED ORDER — HYDROXYZINE HCL 25 MG PO TABS
ORAL_TABLET | ORAL | Status: AC
  Administered 2015-12-16: 50 mg via ORAL
  Filled 2015-12-16: qty 2

## 2015-12-16 MED ORDER — NAPROXEN 250 MG PO TABS
500.0000 mg | ORAL_TABLET | Freq: Once | ORAL | Status: AC
  Administered 2015-12-16: 500 mg via ORAL
  Filled 2015-12-16: qty 2

## 2015-12-16 MED ORDER — OXYCODONE HCL 30 MG PO TABS: 30.0000 mg | ORAL_TABLET | Freq: Four times a day (QID) | ORAL | 0 refills | Status: DC | PRN

## 2015-12-16 MED FILL — ONDANSETRON ODT 4 MG TABLET: 4 | 7 days supply | Qty: 20 | Fill #0

## 2015-12-16 NOTE — ED Provider Notes (Signed)
[facility] Provider Note   CSN: [id number] Arrival date & time: 12/16/15  0229  First Provider Contact:  First MD Initiated Contact with Patient 12/16/15 [phone]        History   Chief Complaint Chief Complaint  Patient presents with  . Back Pain    HPI Jared [name] is a 60 y.o. male.  HPI  This is a 60 year old male with a history of chronic back pain in pain management, degenerative disc disease, hypertension who presents with back pain. Patient was seen and evaluated for the same last night. Patient last night reported a fall that exacerbated chronic sciatica.  He was encouraged to follow-up with his primary physician and pain management. He was discharged with Flexeril and a Medrol Dosepak. Patient reports that he took his home oxycodone with minimal relief. He states that he has run out of his oxycodone as of 12 PM yesterday and cannot get his prescription refilled until tomorrow. He also states that he does not tolerate prednisone.  Regarding his pain, he reports "25 out of 10" left-sided back pain that radiates down the left leg. He reports tingling in the left leg down to the toe. This is not new. He has been ambulatory. He denies any with urination or defecation. Denies any weakness. Denies new injury.    Patient reported during nursing triage that "Toradol didn't work from last night and am I going to get Dilaudid tonight."  I reviewed the narcotic database yesterday for this patient. His last prescription was refilled on 11/17/15.  It appears that he does get monthly prescriptions from the same prescribing doctor.  Past Medical History:  Diagnosis Date  . Anxiety   . BPH (benign prostatic hypertrophy)   . Chest pain   . Chronic back pain   . Chronic prescription opiate use   . Depression   . DJD (degenerative joint disease)   . HTN (hypertension)   . Hypercholesteremia   . Hyperlipemia   . Obesity   . OSA (obstructive sleep apnea)     Patient Active  Problem List   Diagnosis Date Noted  . Generalized anxiety disorder 09/03/2015  . Depression with anxiety 07/22/2015  . Psychosomatic factor in physical condition 07/22/2015  . Anxiety and depression 07/22/2015  . Pain in the chest   . PRES (posterior reversible encephalopathy syndrome) 05/18/2015  . Provoked seizures (HCC) 05/18/2015  . Abdominal pain   . Leukocytosis   . Chronic atrial fibrillation (HCC)   . Chronic systolic heart failure (HCC)   . Hyperkalemia   . Chronic kidney disease   . Thrombocytopenia (HCC)   . Paroxysmal atrial fibrillation (HCC)   . Acute systolic heart failure (HCC)   . Pulmonary hypertension (HCC)   . Chronic back pain 01/18/2015  . NSTEMI (non-ST elevated myocardial infarction) (HCC) 01/13/2015  . Cardiogenic shock (HCC)   . CKD Stage 2 (GFR 72 ml/min) 02/04/2014  . Mixed hyperlipidemia 05/22/2013  . Prediabetes 05/22/2013  . OSA on CPAP 05/22/2013  . Reflux esophagitis 05/22/2013  . Testosterone deficiency 05/22/2013  . Vitamin D deficiency 05/22/2013  . DDD (degenerative disc disease), lumbar 05/22/2013  . Chronic pain syndrome 05/22/2013  . HTN (hypertension) 01/20/2011    Past Surgical History:  Procedure Laterality Date  . APPLICATION OF WOUND VAC N/A 01/08/2015   Procedure: APPLICATION OF WOUND VAC;  Surgeon: Manus Rudd, MD;  Location: [facility] OR;  Service: General;  Laterality: N/A;  . BACK SURGERY     Lspine  w/Hardware  . CARDIAC CATHETERIZATION N/A 01/13/2015   Procedure: Left Heart Cath and Coronary Angiography;  Surgeon: Corky Crafts, MD;  Location: [facility] INVASIVE CV LAB;  Service: Cardiovascular;  Laterality: N/A;  . CERVICAL SPINE SURGERY     w/Hardware  . LAPAROTOMY N/A 01/08/2015   Procedure: EXPLORATORY LAPAROTOMY;  Surgeon: Manus Rudd, MD;  Location: [facility] OR;  Service: General;  Laterality: N/A;  . PROSTATECTOMY    . WOUND DEBRIDEMENT N/A 01/11/2015   Procedure: DEBRIDEMENT WOUND AND VAC DRESSING CHANGE;  Surgeon: Manus Rudd, MD;  Location: [facility] OR;  Service: General;  Laterality: N/A;       Home Medications    Prior to Admission medications   Medication Sig Start Date End Date Taking? Authorizing Provider  amLODipine (NORVASC) 10 MG tablet Take 1 tablet (10 mg total) by mouth daily. 11/11/15   Waldon Merl, PA-C  aspirin 81 MG tablet Take 81 mg by mouth daily.    Historical Provider, MD  carvedilol (COREG) 6.25 MG tablet TAKE 1 TABLET BY MOUTH TWICE DAILY WITH A MEAL 11/11/15   Waldon Merl, PA-C  Cholecalciferol (VITAMIN D-3) 1000 UNITS CAPS Take 2,000 Units by mouth daily.    Historical Provider, MD  citalopram (CELEXA) 40 MG tablet TAKE 1 TABLET BY MOUTH EVERY DAY FOR MOOD 11/11/15   Waldon Merl, PA-C  cyclobenzaprine (FLEXERIL) 10 MG tablet Take 1 tablet (10 mg total) by mouth 2 (two) times daily as needed for muscle spasms. 12/15/15   Shon Baton, MD  famotidine (PEPCID) 20 MG tablet TAKE 1 TABLET (20 MG TOTAL) BY MOUTH 2 (TWO) TIMES DAILY. 11/11/15   Waldon Merl, PA-C  fluticasone (FLONASE) 50 MCG/ACT nasal spray Place 1 spray into both nostrils daily. 07/13/15 11/10/16  Waldon Merl, PA-C  hydrALAZINE (APRESOLINE) 50 MG tablet Take 1 tablet (50 mg total) by mouth 2 (two) times daily. Patient taking differently: Take 50 mg by mouth 3 (three) times daily.  11/11/15   Waldon Merl, PA-C  LORazepam (ATIVAN) 1 MG tablet Take 1 tablet (1 mg total) by mouth every 8 (eight) hours as needed. for anxiety 11/11/15   Waldon Merl, PA-C  methocarbamol (ROBAXIN) 500 MG tablet TAKE 1 TABLET (500 MG TOTAL) BY MOUTH 4 (FOUR) TIMES DAILY. 10/14/15   Pearline Cables, MD  methylPREDNISolone (MEDROL DOSEPAK) 4 MG TBPK tablet Take as directed on packet 12/15/15   Shon Baton, MD  Multiple Vitamin (MULTIVITAMIN WITH MINERALS) TABS tablet Take 1 tablet by mouth daily. 02/11/15   Bernadene Person, NP  naproxen (NAPROSYN) 500 MG tablet Take 1 tablet (500 mg total) by mouth 2 (two) times  daily. 12/16/15   Shon Baton, MD  Omega-3 Fatty Acids (FISH OIL BURP-LESS PO) Take 1 tablet by mouth daily.      Historical Provider, MD  ondansetron (ZOFRAN ODT) 4 MG disintegrating tablet Take 1 tablet (4 mg total) by mouth every 8 (eight) hours as needed for nausea or vomiting. 11/11/15   Waldon Merl, PA-C  oxycodone (ROXICODONE) 30 MG immediate release tablet Take 1 tablet (30 mg total) by mouth every 6 (six) hours as needed for pain. 12/16/15   Shon Baton, MD    Family History Family History  Problem Relation Age of Onset  . Hypertension Mother   . Hypertension Father     Social History Social History  Substance Use Topics  . Smoking status: Former Games developer  . Smokeless tobacco: Never  Used  . Alcohol use No     Allergies   Lyrica [pregabalin]; Morphine and related; Montelukast; Neurontin [gabapentin]; and Wellbutrin [bupropion]   Review of Systems Review of Systems  Constitutional: Negative for fever.  Musculoskeletal: Positive for back pain. Negative for gait problem.  Neurological: Positive for numbness. Negative for weakness.  All other systems reviewed and are negative.    Physical Exam Updated Vital Signs BP 116/66 (BP Location: Left Arm)   Pulse 78   Temp 98.1 F (36.7 C) (Oral)   Resp 18   Ht 6\' 2"  (1.88 m)   Wt 234 lb (106.1 kg)   SpO2 97%   BMI 30.04 kg/m   Physical Exam  Constitutional: He is oriented to person, place, and time. He appears well-developed and well-nourished. No distress.  Sitting it in a chair upon entering the room  HENT:  Head: Normocephalic and atraumatic.  Cardiovascular: Normal rate, regular rhythm and normal heart sounds.   No murmur heard. Pulmonary/Chest: Effort normal and breath sounds normal. No respiratory distress. He has no wheezes.  Abdominal:  Midline abdominal scar noted  Musculoskeletal:  Tenderness palpation lower lumbar spine without step off or deformity, positive left straight leg raise,  negative right straight leg raise  Lymphadenopathy:    He has no cervical adenopathy.  Neurological: He is alert and oriented to person, place, and time.  5 out of 5 strength bilateral lower extremities, normal gait, symmetric patellar reflexes bilaterally, no clonus  Skin: Skin is warm and dry.  Psychiatric: He has a normal mood and affect.  Nursing note and vitals reviewed.    ED Treatments / Results  Labs (all labs ordered are listed, but only abnormal results are displayed) Labs Reviewed - No data to display  EKG  EKG Interpretation None       Radiology Dg Lumbar Spine Complete  Result Date: 12/15/2015 CLINICAL DATA:  60 year old male with fall and low back pain. EXAM: LUMBAR SPINE - COMPLETE 4+ VIEW COMPARISON:  Lumbar spine radiograph dated 11/16/2015 FINDINGS: There is no definite acute fracture or subluxation of the lumbar spine. The bones are osteopenic. Mild multilevel chronic changes with mild loss of vertebral body heights most prominent at L3 and L4. There is mild anterior wedging of the L3. L5-S1 posterior fixation hardware noted. The visualized transverse and spinous processes appear intact. The soft tissues appear unremarkable. IMPRESSION: No acute/traumatic lumbar spine pathology. Electronically Signed   By: Elgie Collard M.D.   On: 12/15/2015 03:42  Ct Cervical Spine Wo Contrast  Result Date: 12/15/2015 CLINICAL DATA:  60 year old male with fall EXAM: CT CERVICAL SPINE WITHOUT CONTRAST TECHNIQUE: Multidetector CT imaging of the cervical spine was performed without intravenous contrast. Multiplanar CT image reconstructions were also generated. COMPARISON:  Cervical spine CT dated 02/07/2015 FINDINGS: There is no acute fracture or subluxation of the cervical spine.There is mild multilevel degenerative changes. C5-C7 disc spacer and anterior fusion hardware noted. There is partial ankylosis of the C4-C5.The odontoid and spinous processes are intact.There is normal  anatomic alignment of the C1-C2 lateral masses. The visualized soft tissues appear unremarkable. IMPRESSION: No acute/traumatic cervical spine pathology. Electronically Signed   By: Elgie Collard M.D.   On: 12/15/2015 04:07   Procedures Procedures (including critical care time)  Medications Ordered in ED Medications  oxyCODONE (Oxy IR/ROXICODONE) immediate release tablet 15 mg (not administered)  naproxen (NAPROSYN) tablet 500 mg (not administered)  hydrOXYzine (ATARAX/VISTARIL) 25 MG tablet (not administered)     Initial Impression /  Assessment and Plan / ED Course  I have reviewed the triage vital signs and the nursing notes.  Pertinent labs & imaging results that were available during my care of the patient were reviewed by me and considered in my medical decision making (see chart for details).  Clinical Course    Patient presents with acute on chronic pain. Was seen and evaluated yesterday for the same. He now reports to me that he does not tolerate steroids and has run out of his chronic pain medication. It does appear he is due for refill in one day. I discussed at length with the patient that he would not be receiving IV or IM narcotics. I do not feel this is appropriate given his chronic pain. He was given a dose of his home pain medication. I discussed with him additional adjuvant pain options including NSAIDs, lidocaine patch, Lyrica - all of which he declines. Patient states "I'm allergic to Lyrica" and when asked his allergy he stated "I died 3 times."  He goes on to tell me about the hospital stay last year where he was critically ill and he relates this to Wellbutrin, Neurontin, and Lyrica use. I have added Lyrica to his allergy list. Patient encouraged to take naproxen twice a day. Given that he is out of his chronic home pain medications, I will give him a 1 day supply. I discussed with him that he should not expect further prescription medications from the ER and needs to  follow-up with his primary physician or arrange a new pain management specialist. Patient stated understanding. I also have encouraged him to follow-up with orthopedist or neurosurgeon specializing in low back pain for possible local injections.  Patient on multiple occasions got very loud and aggressive. He is very angry that I'm not giving him "patient satisfaction."  I again reiterated to him that given the current opioid epidemic, we about strictly by a narcotic policy in the ER.   Narcotic database reviewed.  After history, exam, and medical workup I feel the patient has been appropriately medically screened and is safe for discharge home. Pertinent diagnoses were discussed with the patient. Patient was given return precautions.   Final Clinical Impressions(s) / ED Diagnoses   Final diagnoses:  Left-sided low back pain with left-sided sciatica  Chronic pain    New Prescriptions New Prescriptions   NAPROXEN (NAPROSYN) 500 MG TABLET    Take 1 tablet (500 mg total) by mouth 2 (two) times daily.     Shon Baton, MD 12/16/15 [phone]

## 2015-12-16 NOTE — ED Triage Notes (Signed)
Back to room in w/c, prefers to remain seated in w/c. Returns for chronic L lower back pain with radiation down to L toes, worse starting yesterday afternoon at ~1645, took the last of my meds at ~ noon yesterday, rates 10/10, also reports anxiety and sneezing. States, "can't take the prednisone, toradol didn't do anything for me yesterday, I'm out of my meds, are we going to get me feeling better tonight, dilaudid is really the only thing that works for me". Wife at [employer]. Able to stand to use urinal. Alert, sleepy, NAD, calm, interactive, polite.

## 2015-12-16 NOTE — ED Notes (Signed)
While Dr. Wilkie Aye was in room with pt, pt could be heard at nurses desk yelling at MD. Pt demanding dilaudid and pain medication.

## 2015-12-16 NOTE — ED Notes (Signed)
MD at bedside. 

## 2015-12-16 NOTE — ED Notes (Signed)
Pt rang call bell again to say he was in pain.  Informed pt that the doctor is treating another patient and would be with him as soon as she could.

## 2015-12-16 NOTE — Discharge Instructions (Signed)
You were seen today for back pain. Your pain is chronic. Because of the chronic nature of your pain, narcotic medications in the ER are not appropriate.  You were given your home dose of pain medication. You need follow-up with her primary physician and establish a new pain management doctor to continue to prescribe your outpatient medications. You will be given a one-day prescription for your chronic medication.  You should not expect to receive further narcotic medications from the ER for chronic pain. If your symptoms worsen, you have new symptoms, weakness, difficulty peeing or pooping this would be reason to be reevaluated emergently.

## 2015-12-16 NOTE — ED Notes (Signed)
Pt rang call bell to inform nurse that he was in a lot of pain and that he was nauseated.  Informed pt that the doctor is aware of his pain and that she would be in shortly to see him.  Gave pt emesis bag for the nausea.

## 2015-12-23 MED FILL — CITALOPRAM HBR 40 MG TABLET: 40 | 30 days supply | Qty: 30 | Fill #3

## 2015-12-23 MED FILL — FLUTICASONE PROP 50 MCG SPR: 50 | 30 days supply | Qty: 16 | Fill #1

## 2015-12-25 ENCOUNTER — Other Ambulatory Visit: Payer: Self-pay | Admitting: *Deleted

## 2015-12-25 NOTE — Telephone Encounter (Signed)
Needs OV please.

## 2015-12-25 NOTE — Telephone Encounter (Signed)
Requesting Cyclobenzaprine 10mg -Take 1 tablet by mouth twice daily as needed for muscle spasms. Last refill:12/15/15;#10,0-Which was given to the pt at the ED. Last OV:11/11/15-Next appt (05/05/16) with Cody. Please advise.//AB/CMA

## 2015-12-28 ENCOUNTER — Telehealth: Payer: Self-pay | Admitting: Physician Assistant

## 2015-12-28 NOTE — Telephone Encounter (Signed)
Contacted patient to inform him that he needs an OV to discuss Flexeril. Patient states he will call back to schedule.

## 2015-12-28 NOTE — Telephone Encounter (Signed)
Needs office visit to discuss refill -- Multiple ER visits and has not followed up as directed

## 2015-12-28 NOTE — Telephone Encounter (Signed)
See phone note dated for today. 

## 2015-12-28 NOTE — Telephone Encounter (Signed)
Caller name:Braxtin  Can be reached: [phone]  Pharmacy:  [facility] - [place], [place] - [address] [phone] (Phone) [phone] (Fax)     Reason for call: Request a Rx for Flexeril. States he was given this in the ER but has run out of it and it works better for himj

## 2016-01-05 MED FILL — hydrALAZINE HCL 50 MG TABS: 50 | 30 days supply | Qty: 90 | Fill #3

## 2016-01-05 MED FILL — LORazepam 1 MG TABS: 1 | 30 days supply | Qty: 90 | Fill #0

## 2016-01-06 ENCOUNTER — Other Ambulatory Visit: Payer: Self-pay | Admitting: Family Medicine

## 2016-01-06 MED FILL — METHOCARBAMOL 500 MG TABLET: 500 | 30 days supply | Qty: 120 | Fill #0

## 2016-01-11 MED FILL — CARVEDILOL 6.25 MG TABLET: 6.25 | 90 days supply | Qty: 180 | Fill #0

## 2016-01-21 ENCOUNTER — Other Ambulatory Visit: Payer: Self-pay | Admitting: Physician Assistant

## 2016-01-21 MED FILL — ONDANSETRON ODT 4 MG TABLET: 4 | 7 days supply | Qty: 20 | Fill #0

## 2016-01-27 MED FILL — CITALOPRAM HBR 40 MG TABLET: 40 | 30 days supply | Qty: 30 | Fill #4

## 2016-02-02 MED FILL — LORazepam 1 MG TABS: 1 | 30 days supply | Qty: 90 | Fill #1

## 2016-02-03 ENCOUNTER — Encounter: Payer: Self-pay | Admitting: Neurology

## 2016-02-03 ENCOUNTER — Ambulatory Visit (INDEPENDENT_AMBULATORY_CARE_PROVIDER_SITE_OTHER): Payer: Medicare Other | Admitting: Neurology

## 2016-02-03 VITALS — BP 136/74 | HR 55 | Temp 98.3°F | Ht 74.0 in | Wt 246.4 lb

## 2016-02-03 DIAGNOSIS — R569 Unspecified convulsions: Secondary | ICD-10-CM | POA: Diagnosis not present

## 2016-02-03 DIAGNOSIS — Z8673 Personal history of transient ischemic attack (TIA), and cerebral infarction without residual deficits: Secondary | ICD-10-CM

## 2016-02-03 DIAGNOSIS — F482 Pseudobulbar affect: Secondary | ICD-10-CM

## 2016-02-03 MED ORDER — DEXTROMETHORPHAN-QUINIDINE 20-10 MG PO CAPS: 1.0000 | ORAL_CAPSULE | Freq: Every day | ORAL | 0 refills | Status: DC

## 2016-02-03 NOTE — Patient Instructions (Signed)
1. Start Nuedexta samples: Take 1 capsule daily. Let us know how you feel once samples are almost done, so we can send in prescription for refills 2. Follow-up in 3 months, call for any changes in symptoms

## 2016-02-03 NOTE — Progress Notes (Signed)
NEUROLOGY FOLLOW UP OFFICE NOTE  Jared [name] [id number]  HISTORY OF PRESENT ILLNESS: I had the pleasure of seeing Jared [name] in follow-up in the neurology clinic on 02/03/2016. The patient was last seen 5 months ago for follow-up on seizures that occurred in the setting of PRES last September 2016. He is again accompanied by his wife who helps supplement the history today. MRI brain with and without contrast which showed resolution of signal changes in the posterior region. There is note of encephalomalacia in the inferior posterior right occipital lobe and watershed zone compatible with previous cortical infarct. His wake and drowsy EEG was normal. He has successfully tapered off Vimpat and Keppra with no seizure recurrence. His main concern today is the increased emotionality, he would cry over little things, even a TV show, or things that would normally not cause him to cry. He reports anxiety is fairly well-controlled on lorazepam 1mg  TID. He denies any significant headaches, dizziness, vision changes, no falls. His energy level is still not as good.   HPI: This is a pleasant 60 yo LH man with a history of hypertension, hyperlipidemia, chronic back pain, anxiety, who had a complicated hospital course from August to September 2016. Hospital records were reviewed. He was found unresponsive by his wife on 01/08/15. He was noted to respond to BiPAP and Narcan, but was cyanotic and hypotensive. He had been complaining of chest and abdominal pain. He was intubated and apparently went into PEA arrest. He underwent exploratory laparotomy which did not show any intra-abdominal cause of acute decompensation. Cardiac cath negative. He was in the ICU for acute respiratory failure, severe septic shock, acute renal failure requiring dialysis, shock liver, and started to improve clinically. He was transferred to the floor, per notes having difficulties with dialysis, with elevated BP, and had a witnessed seizure  followed by respiratory arrest and again intubation. He recalls feeling unwell that day, nauseated and in pain. His wife came to his room and he was staring up at the ceiling (he does not recall this), he was seen by his doctors and was responding, then soon after his wife noticed his right arm was jerking, followed by whole body jerking. He was apparently able to answer questions initially, then a code blue was called and patient was re-intubated. He underwent overnight EEG, which showed right temporal non-convulsive seizures, which resolved over the course of the recording. There were right frontotemporal epileptiform discharges, often periodic in the initial portion of the recording, but largely isolated discharges by the end of the recording. Diffuse slowing with polymorphic theta and delta, that started to resolve by the end of the recording, persistent asymmetry, with greater slowing on the right, but also resolving over the course of recording. He had an MRI brain with and without contrast done 02/05/15 which I personally reviewed, showing increased FLAIR and T2 signal in the bilateral occipital and parietal lobes, posterior limb of the left internal capsul and corticospinal tracts, and left cerebellum, no restricted diffusion, compatible with posterior reversible encephalopathy syndrome. He continued to improve clinically, and was discharged home on Keppra 1000mg  BID and Vimpat 100mg  BID. He denies any side effects on the medications.   He continues to do well, with no further seizures or seizure-like symptoms. He continues to deal with anxiety, and feels that this may have been the cause of his initial symptoms, reporting that he had seen his previous PCP and reporting anxiety, started on Wellbutrin which did not help, and was  given Lyrica, which he took for 2 days, then he came in unresponsive to the hospital. He denies any significant headaches, he denies any dizziness bu continues to work on his balance  since deconditioning from his hospital stay, working with PT and doing home exercises. He denies any diplopia, dysarthria, dysphagia, focal numbness/tingling/weakness, bowel/bladder dysfunction. No further dialysis needed since hospital discharges. He has chronic back and left leg pain. He denies any olfactory/gustatory hallucinations, deja vu, rising epigastric sensation, focal numbness/tingling/weakness, myoclonic jerks. He had a normal birth and early development. There is no history of febrile convulsions, CNS infections such as meningitis/encephalitis, significant traumatic brain injury requiring neurosurgical procedures, or family history of seizures  PAST MEDICAL HISTORY: Past Medical History:  Diagnosis Date  . Anxiety   . BPH (benign prostatic hypertrophy)   . Chest pain   . Chronic back pain   . Chronic prescription opiate use   . Depression   . DJD (degenerative joint disease)   . HTN (hypertension)   . Hypercholesteremia   . Hyperlipemia   . Obesity   . Opiate dependence, continuous (HCC)   . OSA (obstructive sleep apnea)     MEDICATIONS: Current Outpatient Prescriptions on File Prior to Visit  Medication Sig Dispense Refill  . amLODipine (NORVASC) 10 MG tablet Take 1 tablet (10 mg total) by mouth daily. 90 tablet 1  . aspirin 81 MG tablet Take 81 mg by mouth daily.    . carvedilol (COREG) 6.25 MG tablet TAKE 1 TABLET BY MOUTH TWICE DAILY WITH A MEAL 180 tablet 1  . Cholecalciferol (VITAMIN D-3) 1000 UNITS CAPS Take 2,000 Units by mouth daily.    . citalopram (CELEXA) 40 MG tablet TAKE 1 TABLET BY MOUTH EVERY DAY FOR MOOD 90 tablet 1  . cyclobenzaprine (FLEXERIL) 10 MG tablet Take 1 tablet (10 mg total) by mouth 2 (two) times daily as needed for muscle spasms. 10 tablet 0  . famotidine (PEPCID) 20 MG tablet TAKE 1 TABLET (20 MG TOTAL) BY MOUTH 2 (TWO) TIMES DAILY. 180 tablet 1  . fluticasone (FLONASE) 50 MCG/ACT nasal spray Place 1 spray into both nostrils daily. 16 g 5  .  hydrALAZINE (APRESOLINE) 50 MG tablet Take 1 tablet (50 mg total) by mouth 2 (two) times daily. (Patient taking differently: Take 50 mg by mouth 3 (three) times daily. ) 180 tablet 1  . LORazepam (ATIVAN) 1 MG tablet Take 1 tablet (1 mg total) by mouth every 8 (eight) hours as needed. for anxiety 90 tablet 1  . methocarbamol (ROBAXIN) 500 MG tablet TAKE 1 TABLET (500 MG TOTAL) BY MOUTH 4 (FOUR) TIMES DAILY. 120 tablet 1  . methylPREDNISolone (MEDROL DOSEPAK) 4 MG TBPK tablet Take as directed on packet 21 tablet 0  . Multiple Vitamin (MULTIVITAMIN WITH MINERALS) TABS tablet Take 1 tablet by mouth daily.    . naproxen (NAPROSYN) 500 MG tablet Take 1 tablet (500 mg total) by mouth 2 (two) times daily. 30 tablet 0  . Omega-3 Fatty Acids (FISH OIL BURP-LESS PO) Take 1 tablet by mouth daily.      . ondansetron (ZOFRAN-ODT) 4 MG disintegrating tablet DISSOLVE 1 TABLET (4 MG TOTAL) BY MOUTH EVERY 8 HOURS AS NEEDED FOR NAUSEA OR VOMITING. 20 tablet 0  . oxycodone (ROXICODONE) 30 MG immediate release tablet Take 1 tablet (30 mg total) by mouth every 6 (six) hours as needed for pain. 4 tablet 0   No current facility-administered medications on file prior to visit.  ALLERGIES: Allergies  Allergen Reactions  . Lyrica [Pregabalin] Anaphylaxis  . Morphine And Related Anaphylaxis and Shortness Of Breath  . Montelukast Other (See Comments)    unknown  . Neurontin [Gabapentin] Other (See Comments)    delusional  . Wellbutrin [Bupropion] Palpitations    Insomnia    FAMILY HISTORY: Family History  Problem Relation Age of Onset  . Hypertension Mother   . Hypertension Father     SOCIAL HISTORY: Social History   Social History  . Marital status: Married    Spouse name: N/A  . Number of children: 1  . Years of education: N/A   Occupational History  . Disabled    Social History Main Topics  . Smoking status: Former Games developer  . Smokeless tobacco: Never Used  . Alcohol use No  . Drug use: No    . Sexual activity: Not on file   Other Topics Concern  . Not on file   Social History Narrative  . No narrative on file    REVIEW OF SYSTEMS: Constitutional: No fevers, chills, or sweats, no generalized fatigue, change in appetite Eyes: No visual changes, double vision, eye pain Ear, nose and throat: No hearing loss, ear pain, nasal congestion, sore throat Cardiovascular: No chest pain, palpitations Respiratory:  No shortness of breath at rest or with exertion, wheezes GastrointestinaI: No nausea, vomiting, diarrhea, abdominal pain, fecal incontinence Genitourinary:  No dysuria, urinary retention or frequency Musculoskeletal:  + neck pain, back pain Integumentary: No rash, pruritus, skin lesions Neurological: as above Psychiatric: No depression, insomnia, +anxiety Endocrine: No palpitations, fatigue, diaphoresis, mood swings, change in appetite, change in weight, increased thirst Hematologic/Lymphatic:  No anemia, purpura, petechiae. Allergic/Immunologic: no itchy/runny eyes, nasal congestion, recent allergic reactions, rashes  PHYSICAL EXAM: Vitals:   02/03/16 1437  BP: 136/74  Pulse: (!) 55  Temp: 98.3 F (36.8 C)   General: No acute distress Head:  Normocephalic/atraumatic Neck: supple, no paraspinal tenderness, full range of motion Heart:  Regular rate and rhythm Lungs:  Clear to auscultation bilaterally Back: No paraspinal tenderness Skin/Extremities: No rash, no edema Neurological Exam: alert and oriented to person, place, and time. No aphasia or dysarthria. Fund of knowledge is appropriate.  Recent and remote memory are intact.  Attention and concentration are normal.    Able to name objects and repeat phrases. Cranial nerves: Pupils equal, round, reactive to light. Extraocular movements intact with no nystagmus, right esotropia. Visual fields full. Facial sensation intact. No facial asymmetry. Tongue, uvula, palate midline.  Motor: Bulk and tone normal, muscle  strength 5/5 throughout with no pronator drift.  Sensation to light touch intact.  No extinction to double simultaneous stimulation.  Deep tendon reflexes 2+ throughout, toes downgoing.  Finger to nose testing intact.  Gait narrow-based and steady, able to tandem walk adequately.  Romberg negative.  IMPRESSION: This is a 60 yo LH man with a history of hypertension, hyperlipidemia, anxiety, chronic back pain, with a prolonged hospital admission last September 2016 for septic shock with multi-organ failure, complicated by posterior reversible encephalopathy with symptomatic seizure. His MRI in September 2016 had shown typical findings seen in PRES, and his EEG had shown seizures arising from the right temporal region and right frontotemporal epileptiform discharges. He denies any seizures since his hospital stay in September 2016. Repeat MRI brain with and without contrast which showed resolution of signal changes in the posterior region. There is note of encephalomalacia in the inferior posterior right occipital lobe and watershed zone compatible with previous  cortical infarct. 24-hour EEG normal. He has tapered off Vimpat and Keppra with no seizure recurrence.  He continues to report increased emotionality and anxiety, even off Keppra. He had these issues prior to starting medication. He is taking lorazepam 1mg  TID for the anxiety. We discussed emotionality, he would cry even for the smallest things. With history of stroke, pseudobulbar affect is considered, he will try Nuedexta 1 cap daily for the next 2 weeks. If helpful, prescription will be called in, we may increase to BID dosing. If no effect, he agrees to see psychiatry where he can potentially discuss other anxiolytic options with his doctors due to risk of tolerance and dependence on lorazepam, as well as withdrawal seizures if stopped abruptly. He is aware of [place] driving laws to stop driving after a seizure until 6 months seizure-free. He will follow-up  in 3 months and knows to call for any changes.   Thank you for allowing me to participate in his care.  Please do not hesitate to call for any questions or concerns.  The duration of this appointment visit was 25 minutes of face-to-face time with the patient.  Greater than 50% of this time was spent in counseling, explanation of diagnosis, planning of further management, and coordination of care.   Patrcia DollyKaren Aquino, M.D.   CC: Marcelline MatesWilliam Martin, PA-C

## 2016-02-08 ENCOUNTER — Ambulatory Visit: Payer: Medicare Other | Admitting: Physician Assistant

## 2016-02-08 ENCOUNTER — Telehealth: Payer: Self-pay | Admitting: Physician Assistant

## 2016-02-08 NOTE — Telephone Encounter (Signed)
Patients wife called at 10:35 stating that patient can not make it to his appointment today. Charge or No Charge?

## 2016-02-08 NOTE — Telephone Encounter (Signed)
No charge. 

## 2016-02-10 DIAGNOSIS — G894 Chronic pain syndrome: Secondary | ICD-10-CM | POA: Diagnosis not present

## 2016-02-10 DIAGNOSIS — G8929 Other chronic pain: Secondary | ICD-10-CM | POA: Diagnosis not present

## 2016-02-10 DIAGNOSIS — M542 Cervicalgia: Secondary | ICD-10-CM | POA: Diagnosis not present

## 2016-02-10 DIAGNOSIS — M545 Low back pain: Secondary | ICD-10-CM | POA: Diagnosis not present

## 2016-02-10 DIAGNOSIS — M961 Postlaminectomy syndrome, not elsewhere classified: Secondary | ICD-10-CM | POA: Diagnosis not present

## 2016-02-10 DIAGNOSIS — M47812 Spondylosis without myelopathy or radiculopathy, cervical region: Secondary | ICD-10-CM | POA: Diagnosis not present

## 2016-02-11 MED FILL — FLUTICASONE PROP 50 MCG SPR: 50 | 30 days supply | Qty: 16 | Fill #2

## 2016-02-11 MED FILL — FAMOTIDINE 20 MG TABLET: 20 | 90 days supply | Qty: 180 | Fill #1

## 2016-02-11 MED FILL — AMLODIPINE BESYLATE 10 MG T: 10 | 90 days supply | Qty: 90 | Fill #1

## 2016-02-16 ENCOUNTER — Encounter: Payer: Self-pay | Admitting: Physician Assistant

## 2016-02-16 ENCOUNTER — Ambulatory Visit (INDEPENDENT_AMBULATORY_CARE_PROVIDER_SITE_OTHER): Payer: Medicare Other | Admitting: Physician Assistant

## 2016-02-16 VITALS — BP 106/68 | HR 62 | Temp 98.1°F | Resp 16 | Ht 74.0 in | Wt 251.0 lb

## 2016-02-16 DIAGNOSIS — Z23 Encounter for immunization: Secondary | ICD-10-CM | POA: Diagnosis not present

## 2016-02-16 DIAGNOSIS — H1131 Conjunctival hemorrhage, right eye: Secondary | ICD-10-CM | POA: Diagnosis not present

## 2016-02-16 NOTE — Progress Notes (Signed)
Patient presents to clinic today c/o 1 day of redness of R eye. Denies known trauma or injury to the eye. Does note rubbing eyes a bit last night. Denies eye pain, itching, drainage, swelling or change to vision. Has chronic strabismus of R eye. Denies symptoms of L eye. Denies fever, chills or URI symptoms.   Past Medical History:  Diagnosis Date  . Anxiety   . BPH (benign prostatic hypertrophy)   . Chest pain   . Chronic back pain   . Chronic prescription opiate use   . Depression   . DJD (degenerative joint disease)   . HTN (hypertension)   . Hypercholesteremia   . Hyperlipemia   . Obesity   . Opiate dependence, continuous ([place])   . OSA (obstructive sleep apnea)     Current Outpatient Prescriptions on File Prior to Visit  Medication Sig Dispense Refill  . amLODipine (NORVASC) 10 MG tablet Take 1 tablet (10 mg total) by mouth daily. 90 tablet 1  . aspirin 81 MG tablet Take 81 mg by mouth daily.    . carvedilol (COREG) 6.25 MG tablet TAKE 1 TABLET BY MOUTH TWICE DAILY WITH A MEAL 180 tablet 1  . Cholecalciferol (VITAMIN D-3) 1000 UNITS CAPS Take 2,000 Units by mouth daily.    . citalopram (CELEXA) 40 MG tablet TAKE 1 TABLET BY MOUTH EVERY DAY FOR MOOD 90 tablet 1  . famotidine (PEPCID) 20 MG tablet TAKE 1 TABLET (20 MG TOTAL) BY MOUTH 2 (TWO) TIMES DAILY. 180 tablet 1  . fluticasone (FLONASE) 50 MCG/ACT nasal spray Place 1 spray into both nostrils daily. 16 g 5  . hydrALAZINE (APRESOLINE) 50 MG tablet Take 1 tablet (50 mg total) by mouth 2 (two) times daily. (Patient taking differently: Take 50 mg by mouth 3 (three) times daily. ) 180 tablet 1  . LORazepam (ATIVAN) 1 MG tablet Take 1 tablet (1 mg total) by mouth every 8 (eight) hours as needed. for anxiety 90 tablet 1  . methocarbamol (ROBAXIN) 500 MG tablet TAKE 1 TABLET (500 MG TOTAL) BY MOUTH 4 (FOUR) TIMES DAILY. 120 tablet 1  . Multiple Vitamin (MULTIVITAMIN WITH MINERALS) TABS tablet Take 1 tablet by mouth daily.    .  Omega-3 Fatty Acids (FISH OIL BURP-LESS PO) Take 1 tablet by mouth daily.      . ondansetron (ZOFRAN-ODT) 4 MG disintegrating tablet DISSOLVE 1 TABLET (4 MG TOTAL) BY MOUTH EVERY 8 HOURS AS NEEDED FOR NAUSEA OR VOMITING. 20 tablet 0  . oxycodone (ROXICODONE) 30 MG immediate release tablet Take 1 tablet (30 mg total) by mouth every 6 (six) hours as needed for pain. 4 tablet 0   No current facility-administered medications on file prior to visit.     Allergies  Allergen Reactions  . Lyrica [Pregabalin] Anaphylaxis  . Morphine And Related Anaphylaxis and Shortness Of Breath  . Montelukast Other (See Comments)    unknown  . Neurontin [Gabapentin] Other (See Comments)    delusional  . Wellbutrin [Bupropion] Palpitations    Insomnia    Family History  Problem Relation Age of Onset  . Hypertension Mother   . Hypertension Father     Social History   Social History  . Marital status: Married    Spouse name: N/A  . Number of children: 1  . Years of education: N/A   Occupational History  . Disabled    Social History Main Topics  . Smoking status: Former Research scientist (life sciences)  . Smokeless tobacco: Never Used  .  Alcohol use No  . Drug use: No  . Sexual activity: Not Asked   Other Topics Concern  . None   Social History Narrative  . None   Review of Systems - See HPI.  All other ROS are negative.  BP 106/68 (BP Location: Left Arm, Patient Position: Sitting, Cuff Size: Large)   Pulse 62   Temp 98.1 F (36.7 C) (Oral)   Resp 16   Ht 6' 2" (1.88 m)   Wt 251 lb (113.9 kg)   SpO2 98%   BMI 32.23 kg/m   Physical Exam  Constitutional: He is oriented to person, place, and time and well-developed, well-nourished, and in no distress.  HENT:  Head: Normocephalic and atraumatic.  Right Ear: External ear normal.  Left Ear: External ear normal.  Nose: Nose normal.  Mouth/Throat: Oropharynx is clear and moist. No oropharyngeal exudate.  TM within normal limits bilaterally.  Eyes: EOM and  lids are normal. Pupils are equal, round, and reactive to light. Lids are everted and swept, no foreign bodies found. Right eye exhibits no discharge, no exudate and no hordeolum. No foreign body present in the right eye. Left eye exhibits no discharge, no exudate and no hordeolum. No foreign body present in the left eye. Right conjunctiva is not injected. Right conjunctiva has a hemorrhage. Left conjunctiva is not injected. Left conjunctiva has no hemorrhage.  Cardiovascular: Normal rate, regular rhythm, normal heart sounds and intact distal pulses.   Pulmonary/Chest: Effort normal and breath sounds normal. No respiratory distress. He has no wheezes. He has no rales. He exhibits no tenderness.  Neurological: He is alert and oriented to person, place, and time.  Skin: Skin is warm and dry. No rash noted.  Vitals reviewed.  Recent Results (from the past 2160 hour(s))  CBC with Differential     Status: None   Collection Time: 12/01/15  8:45 PM  Result Value Ref Range   WBC 9.3 4.0 - 10.5 K/uL   RBC 4.48 4.22 - 5.81 MIL/uL   Hemoglobin 13.9 13.0 - 17.0 g/dL   HCT 39.5 39.0 - 52.0 %   MCV 88.2 78.0 - 100.0 fL   MCH 31.0 26.0 - 34.0 pg   MCHC 35.2 30.0 - 36.0 g/dL   RDW 12.5 11.5 - 15.5 %   Platelets 274 150 - 400 K/uL   Neutrophils Relative % 82 %   Neutro Abs 7.5 1.7 - 7.7 K/uL   Lymphocytes Relative 13 %   Lymphs Abs 1.2 0.7 - 4.0 K/uL   Monocytes Relative 5 %   Monocytes Absolute 0.5 0.1 - 1.0 K/uL   Eosinophils Relative 0 %   Eosinophils Absolute 0.0 0.0 - 0.7 K/uL   Basophils Relative 0 %   Basophils Absolute 0.0 0.0 - 0.1 K/uL  Comprehensive metabolic panel     Status: Abnormal   Collection Time: 12/01/15  8:45 PM  Result Value Ref Range   Sodium 137 135 - 145 mmol/L   Potassium 3.5 3.5 - 5.1 mmol/L   Chloride 100 (L) 101 - 111 mmol/L   CO2 25 22 - 32 mmol/L   Glucose, Bld 96 65 - 99 mg/dL   BUN 12 6 - 20 mg/dL   Creatinine, Ser 1.36 (H) 0.61 - 1.24 mg/dL   Calcium 10.3 8.9  - 10.3 mg/dL   Total Protein 7.8 6.5 - 8.1 g/dL   Albumin 4.5 3.5 - 5.0 g/dL   AST 21 15 - 41 U/L   ALT 17 17 -  63 U/L   Alkaline Phosphatase 66 38 - 126 U/L   Total Bilirubin 0.9 0.3 - 1.2 mg/dL   GFR calc non Af Amer 54 (L) >60 mL/min   GFR calc Af Amer >60 >60 mL/min    Comment: (NOTE) The eGFR has been calculated using the CKD EPI equation. This calculation has not been validated in all clinical situations. eGFR's persistently <60 mL/min signify possible Chronic Kidney Disease.    Anion gap 12 5 - 15  Protime-INR     Status: None   Collection Time: 12/01/15  8:45 PM  Result Value Ref Range   Prothrombin Time 13.2 11.6 - 15.2 seconds   INR 0.98 0.00 - 1.49  APTT     Status: None   Collection Time: 12/01/15  8:45 PM  Result Value Ref Range   aPTT 33 24 - 37 seconds  Fibrinogen     Status: None   Collection Time: 12/01/15  8:45 PM  Result Value Ref Range   Fibrinogen 417 204 - 475 mg/dL    Comment: Performed at [facility]    Assessment/Plan: 1. Encounter for immunization Flu shot given by nursing staff today. - Flu Vaccine QUAD 36+ mos IM  2. Subconjunctival hemorrhage of right eye No evidence of foreign body or infection. Asymptomatic. Discussed potential etiologies of this issue. Discussed benign nature and prognosis. No treatment necessary as will resolve on its own. FU if there is any recurrence or new symptoms. Handout given to patient.    Leeanne Rio, PA-C

## 2016-02-16 NOTE — Patient Instructions (Signed)
Subconjunctival Hemorrhage °Subconjunctival hemorrhage is bleeding that happens between the white part of your eye (sclera) and the clear membrane that covers the outside of your eye (conjunctiva). There are many tiny blood vessels near the surface of your eye. A subconjunctival hemorrhage happens when one or more of these vessels breaks and bleeds, causing a red patch to appear on your eye. This is similar to a bruise. °Depending on the amount of bleeding, the red patch may only cover a small area of your eye or it may cover the entire visible part of the sclera. If a lot of blood collects under the conjunctiva, there may also be swelling. Subconjunctival hemorrhages do not affect your vision or cause pain, but your eye may feel irritated if there is swelling. Subconjunctival hemorrhages usually do not require treatment, and they disappear on their own within two weeks. °CAUSES °This condition may be caused by: °· Mild trauma, such as rubbing your eye too hard. °· Severe trauma or blunt injuries. °· Coughing, sneezing, or vomiting. °· Straining, such as when lifting a heavy object. °· High blood pressure. °· Recent eye surgery. °· A history of diabetes. °· Certain medicines, especially blood thinners (anticoagulants). °· Other conditions, such as eye tumors, bleeding disorders, or blood vessel abnormalities. °Subconjunctival hemorrhages can happen without an obvious cause.  °SYMPTOMS  °Symptoms of this condition include: °· A bright red or dark red patch on the white part of the eye. °¨ The red area may spread out to cover a larger area of the eye before it goes away. °¨ The red area may turn brownish-yellow before it goes away. °· Swelling. °· Mild eye irritation. °DIAGNOSIS °This condition is diagnosed with a physical exam. If your subconjunctival hemorrhage was caused by trauma, your health care provider may refer you to an eye specialist (ophthalmologist) or another specialist to check for other injuries. You  may have other tests, including: °· An eye exam. °· A blood pressure check. °· Blood tests to check for bleeding disorders. °If your subconjunctival hemorrhage was caused by trauma, X-rays or a CT scan may be done to check for other injuries. °TREATMENT °Usually, no treatment is needed. Your health care provider may recommend eye drops or cold compresses to help with discomfort. °HOME CARE INSTRUCTIONS °· Take over-the-counter and prescription medicines only as directed by your health care provider. °· Use eye drops or cold compresses to help with discomfort as directed by your health care provider. °· Avoid activities, things, and environments that may irritate or injure your eye. °· Keep all follow-up visits as told by your health care provider. This is important. °SEEK MEDICAL CARE IF: °· You have pain in your eye. °· The bleeding does not go away within 3 weeks. °· You keep getting new subconjunctival hemorrhages. °SEEK IMMEDIATE MEDICAL CARE IF: °· Your vision changes or you have difficulty seeing. °· You suddenly develop severe sensitivity to light. °· You develop a severe headache, persistent vomiting, confusion, or abnormal tiredness (lethargy). °· Your eye seems to bulge or protrude from your eye socket. °· You develop unexplained bruises on your body. °· You have unexplained bleeding in another area of your body. °  °This information is not intended to replace advice given to you by your health care provider. Make sure you discuss any questions you have with your health care provider. °  °Document Released: 05/09/2005 Document Revised: 01/28/2015 Document Reviewed: 07/16/2014 °Elsevier Interactive Patient Education ©2016 [employer] ° °

## 2016-02-16 NOTE — Progress Notes (Signed)
Pre visit review using our clinic review tool, if applicable. No additional management support is needed unless otherwise documented below in the visit note/SLS  

## 2016-02-17 MED FILL — METHOCARBAMOL 500 MG TABLET: 500 | 30 days supply | Qty: 120 | Fill #1

## 2016-02-18 ENCOUNTER — Other Ambulatory Visit: Payer: Self-pay | Admitting: Physician Assistant

## 2016-02-18 MED FILL — hydrALAZINE HCL 50 MG TABS: 50 | 90 days supply | Qty: 180 | Fill #0

## 2016-02-19 MED FILL — ONDANSETRON ODT 4 MG TABLET: 4 | 6 days supply | Qty: 20 | Fill #0

## 2016-02-25 MED FILL — CITALOPRAM HBR 40 MG TABLET: 40 | 30 days supply | Qty: 30 | Fill #5

## 2016-03-10 ENCOUNTER — Other Ambulatory Visit: Payer: Self-pay | Admitting: Physician Assistant

## 2016-03-10 MED FILL — FLUTICASONE PROP 50 MCG SPR: 50 | 30 days supply | Qty: 16 | Fill #0

## 2016-03-10 NOTE — Telephone Encounter (Signed)
Medication filled to pharmacy as requested.   

## 2016-03-14 ENCOUNTER — Telehealth: Payer: Self-pay | Admitting: Physician Assistant

## 2016-03-14 MED ORDER — LORAZEPAM 1 MG PO TABS: 1.0000 mg | ORAL_TABLET | Freq: Three times a day (TID) | ORAL | 0 refills | Status: DC | PRN

## 2016-03-14 MED FILL — LORazepam 1 MG TABS: 1 | 30 days supply | Qty: 90 | Fill #0

## 2016-03-14 NOTE — Telephone Encounter (Signed)
Pharmacy would be [facility]. Thank you

## 2016-03-14 NOTE — Telephone Encounter (Signed)
I apologize but I have not received the request before getting this message. I have refill the medication and faxed to pharmacy personally.

## 2016-03-14 NOTE — Telephone Encounter (Signed)
Caller name: Porfirio Mylar Relation to pt: spouse Call back number: [phone] Pharmacy:  Reason for call: Pt's spouse states called the pharmacy needing a refill for LORazepam (ATIVAN) 1 MG tablet since last Tuesday 03-08-16 and pt went today to the pharmacy and the pharmacist mentioned they did not have the refill for Lorazepam, from the pharmacy pt's spouse was sent to our office to request meds.  Pt's spouse states that pt is out of meds since Friday or Saturday (she does not remember) but states that pt needs the refill ASAP. Please advise.

## 2016-03-15 NOTE — Telephone Encounter (Signed)
Pt was called and informed the below, pt's spouse Porfirio Mylar) understood and will pick up rx.

## 2016-03-16 ENCOUNTER — Ambulatory Visit: Payer: Medicare Other | Admitting: Physician Assistant

## 2016-03-16 DIAGNOSIS — Z0289 Encounter for other administrative examinations: Secondary | ICD-10-CM

## 2016-03-17 ENCOUNTER — Encounter: Payer: Self-pay | Admitting: Physician Assistant

## 2016-03-21 ENCOUNTER — Other Ambulatory Visit: Payer: Self-pay | Admitting: Family Medicine

## 2016-03-21 MED FILL — METHOCARBAMOL 500 MG TABLET: 500 | 30 days supply | Qty: 120 | Fill #0

## 2016-03-21 NOTE — Telephone Encounter (Signed)
Last seen 02/16/16 Last filled #[id number] rf 01/06/16 by Dr. Patsy Lager Please advise PC

## 2016-03-22 ENCOUNTER — Emergency Department (HOSPITAL_BASED_OUTPATIENT_CLINIC_OR_DEPARTMENT_OTHER)
Admission: EM | Admit: 2016-03-22 | Discharge: 2016-03-22 | Disposition: A | Discharge status: Home / Self Care | Payer: Medicare Other | Attending: Physician Assistant | Admitting: Physician Assistant

## 2016-03-22 ENCOUNTER — Emergency Department (HOSPITAL_BASED_OUTPATIENT_CLINIC_OR_DEPARTMENT_OTHER): Payer: Medicare Other

## 2016-03-22 ENCOUNTER — Encounter (HOSPITAL_BASED_OUTPATIENT_CLINIC_OR_DEPARTMENT_OTHER): Payer: Self-pay | Admitting: Emergency Medicine

## 2016-03-22 DIAGNOSIS — I5022 Chronic systolic (congestive) heart failure: Secondary | ICD-10-CM | POA: Diagnosis not present

## 2016-03-22 DIAGNOSIS — R51 Headache: Secondary | ICD-10-CM | POA: Diagnosis not present

## 2016-03-22 DIAGNOSIS — B349 Viral infection, unspecified: Secondary | ICD-10-CM

## 2016-03-22 DIAGNOSIS — Z79891 Long term (current) use of opiate analgesic: Secondary | ICD-10-CM | POA: Insufficient documentation

## 2016-03-22 DIAGNOSIS — Z87891 Personal history of nicotine dependence: Secondary | ICD-10-CM | POA: Insufficient documentation

## 2016-03-22 DIAGNOSIS — I130 Hypertensive heart and chronic kidney disease with heart failure and stage 1 through stage 4 chronic kidney disease, or unspecified chronic kidney disease: Secondary | ICD-10-CM | POA: Insufficient documentation

## 2016-03-22 DIAGNOSIS — Z7982 Long term (current) use of aspirin: Secondary | ICD-10-CM | POA: Insufficient documentation

## 2016-03-22 DIAGNOSIS — N182 Chronic kidney disease, stage 2 (mild): Secondary | ICD-10-CM | POA: Insufficient documentation

## 2016-03-22 DIAGNOSIS — R0602 Shortness of breath: Secondary | ICD-10-CM | POA: Diagnosis not present

## 2016-03-22 HISTORY — DX: Unspecified convulsions: R56.9

## 2016-03-22 LAB — CBC WITH DIFFERENTIAL/PLATELET
Basophils Absolute: 0 10*3/uL (ref 0.0–0.1)
Basophils Relative: 1 %
Eosinophils Absolute: 0.2 10*3/uL (ref 0.0–0.7)
Eosinophils Relative: 2 %
HEMATOCRIT: 37.7 % — AB (ref 39.0–52.0)
HEMOGLOBIN: 12.6 g/dL — AB (ref 13.0–17.0)
LYMPHS ABS: 1.1 10*3/uL (ref 0.7–4.0)
LYMPHS PCT: 15 %
MCH: 30.6 pg (ref 26.0–34.0)
MCHC: 33.4 g/dL (ref 30.0–36.0)
MCV: 91.5 fL (ref 78.0–100.0)
MONO ABS: 0.5 10*3/uL (ref 0.1–1.0)
MONOS PCT: 7 %
NEUTROS ABS: 5.7 10*3/uL (ref 1.7–7.7)
NEUTROS PCT: 75 %
Platelets: 217 10*3/uL (ref 150–400)
RBC: 4.12 MIL/uL — ABNORMAL LOW (ref 4.22–5.81)
RDW: 12.6 % (ref 11.5–15.5)
WBC: 7.6 10*3/uL (ref 4.0–10.5)

## 2016-03-22 LAB — COMPREHENSIVE METABOLIC PANEL
ALBUMIN: 3.8 g/dL (ref 3.5–5.0)
ALK PHOS: 70 U/L (ref 38–126)
ALT: 101 U/L — ABNORMAL HIGH (ref 17–63)
ANION GAP: 8 (ref 5–15)
AST: 61 U/L — ABNORMAL HIGH (ref 15–41)
BUN: 16 mg/dL (ref 6–20)
CALCIUM: 9.6 mg/dL (ref 8.9–10.3)
CHLORIDE: 99 mmol/L — AB (ref 101–111)
CO2: 30 mmol/L (ref 22–32)
Creatinine, Ser: 1.31 mg/dL — ABNORMAL HIGH (ref 0.61–1.24)
GFR calc non Af Amer: 57 mL/min — ABNORMAL LOW (ref >60)
GLUCOSE: 98 mg/dL (ref 65–99)
POTASSIUM: 4 mmol/L (ref 3.5–5.1)
SODIUM: 137 mmol/L (ref 135–145)
Total Bilirubin: 0.5 mg/dL (ref 0.3–1.2)
Total Protein: 6.9 g/dL (ref 6.5–8.1)

## 2016-03-22 MED ORDER — SODIUM CHLORIDE 0.9 % IV BOLUS (SEPSIS)
1000.0000 mL | Freq: Once | INTRAVENOUS | Status: AC
Start: 2016-03-22 — End: 2016-03-22
  Administered 2016-03-22: 1000 mL via INTRAVENOUS

## 2016-03-22 MED ORDER — KETOROLAC TROMETHAMINE 30 MG/ML IJ SOLN
30.0000 mg | Freq: Once | INTRAMUSCULAR | Status: AC
  Administered 2016-03-22: 30 mg via INTRAVENOUS
  Filled 2016-03-22: qty 1

## 2016-03-22 MED ORDER — ONDANSETRON HCL 4 MG PO TABS: 4.0000 mg | ORAL_TABLET | Freq: Three times a day (TID) | ORAL | 0 refills | Status: DC | PRN

## 2016-03-22 MED ORDER — PROCHLORPERAZINE EDISYLATE 5 MG/ML IJ SOLN
10.0000 mg | Freq: Once | INTRAMUSCULAR | Status: DC
  Filled 2016-03-22: qty 2

## 2016-03-22 MED ORDER — OXYCODONE-ACETAMINOPHEN 5-325 MG PO TABS
1.0000 | ORAL_TABLET | Freq: Once | ORAL | Status: AC
Start: 2016-03-22 — End: 2016-03-22
  Administered 2016-03-22: 1 via ORAL
  Filled 2016-03-22: qty 1

## 2016-03-22 MED ORDER — ONDANSETRON HCL 4 MG/2ML IJ SOLN
4.0000 mg | Freq: Once | INTRAMUSCULAR | Status: AC
  Administered 2016-03-22: 4 mg via INTRAVENOUS
  Filled 2016-03-22: qty 2

## 2016-03-22 NOTE — ED Triage Notes (Signed)
Patient reports that he has had N/V - and a Headache since yesterday. The patient reports that he bust a abscess on his back and has since felt bad.

## 2016-03-22 NOTE — ED Notes (Signed)
Patient transported to CT 

## 2016-03-22 NOTE — ED Notes (Signed)
ED Provider at bedside. 

## 2016-03-22 NOTE — ED Notes (Signed)
While flushing line, pt states IV began to hurt. Pt began fidgeting and grimacing. IV appears patent, flushes easily with no growth or cooling above the site, bolus drips quickly to gravity. Pt continues to c/o pain to site. IV removed for pt comfort.

## 2016-03-22 NOTE — Discharge Instructions (Signed)
We are unsure what caused your nausea, headache, feeling of being unwell. Your labs appear largely normal. You do have elevations in your LFTs. (AST and ALT) We want you to get a follow-up with her primary care physician to repeat these in the next week.   If you have any fever, increased vomiting, increased abdominal pain or other concerns please return immediately.

## 2016-03-22 NOTE — ED Provider Notes (Signed)
[facility] Provider Note   CSN: [id number] Arrival date & time: 03/22/16  2028  By signing my name below, I, [name], attest that this documentation has been prepared under the direction and in the presence of Jared [name] AnLyn Candace Ramus, MD. Electronically Signed: [name], ED Scribe. 03/22/16. 9:32 PM.  History   Chief Complaint Chief Complaint  Patient presents with  . Headache    HPI  [name] is a 60 y.o. male with a PMHx of HTN, hyperlipidemia, who presents to the Emergency Department complaining of gradually worsening HA onset 3 days. Family notes that prior to the onset of his symptoms, he had an abscess to his upper back that was lanced at home by a friend. Wife reports that she would like the area checked out to rule out infection. He states that he is having associated symptoms of nausea x 6 weeks, vomiting, generalized weakness, chills, subjective fever, resolved wheezing, rhinorrhea, fatigue, and upper back pain. He states that he has not tried any medications for the relief of his symptoms. He denies nasal congestion, sinus pressure, sore throat, ear pain, and any other symptoms. Pt states that he has had back and neck surgeries.    The history is provided by the patient. No language interpreter was used.    Past Medical History:  Diagnosis Date  . Anxiety   . BPH (benign prostatic hypertrophy)   . Chest pain   . Chronic back pain   . Chronic prescription opiate use   . Depression   . DJD (degenerative joint disease)   . HTN (hypertension)   . Hypercholesteremia   . Hyperlipemia   . Obesity   . Opiate dependence, continuous (HCC)   . OSA (obstructive sleep apnea)   . Seizures [facility](HCC)     Patient Active Problem List   Diagnosis Date Noted  . History of stroke 02/03/2016  . Generalized anxiety disorder 09/03/2015  . Depression with anxiety 07/22/2015  . Psychosomatic factor in physical condition 07/22/2015  . Anxiety and depression  07/22/2015  . Pain in the chest   . PRES (posterior reversible encephalopathy syndrome) 05/18/2015  . Provoked seizures (HCC) 05/18/2015  . Abdominal pain   . Leukocytosis   . Chronic atrial fibrillation (HCC)   . Chronic systolic heart failure (HCC)   . Hyperkalemia   . Chronic kidney disease   . Thrombocytopenia (HCC)   . Paroxysmal atrial fibrillation (HCC)   . Acute systolic heart failure (HCC)   . Pulmonary hypertension   . Chronic back pain 01/18/2015  . NSTEMI (non-ST elevated myocardial infarction) (HCC) 01/13/2015  . Cardiogenic shock (HCC)   . CKD Stage 2 (GFR 72 ml/min) 02/04/2014  . Mixed hyperlipidemia 05/22/2013  . Prediabetes 05/22/2013  . OSA on CPAP 05/22/2013  . Reflux esophagitis 05/22/2013  . Testosterone deficiency 05/22/2013  . Vitamin D deficiency 05/22/2013  . DDD (degenerative disc disease), lumbar 05/22/2013  . Chronic pain syndrome 05/22/2013  . HTN (hypertension) 01/20/2011    Past Surgical History:  Procedure Laterality Date  . APPLICATION OF WOUND VAC N/A 01/08/2015   Procedure: APPLICATION OF WOUND VAC;  Surgeon: Manus RuddMatthew Tsuei, MD;  Location: [facility] OR;  Service: General;  Laterality: N/A;  . BACK SURGERY     Lspine w/Hardware  . CARDIAC CATHETERIZATION N/A 01/13/2015   Procedure: Left Heart Cath and Coronary Angiography;  Surgeon: Corky CraftsJayadeep S Varanasi, MD;  [facility];  Service: Cardiovascular;  Laterality: N/A;  . CERVICAL SPINE SURGERY  w/Hardware  . LAPAROTOMY N/A 01/08/2015   Procedure: EXPLORATORY LAPAROTOMY;  Surgeon: Manus Rudd, MD;  Location: [facility] OR;  Service: General;  Laterality: N/A;  . PROSTATECTOMY    . WOUND DEBRIDEMENT N/A 01/11/2015   Procedure: DEBRIDEMENT WOUND AND VAC DRESSING CHANGE;  Surgeon: Manus Rudd, MD;  Location: [facility] OR;  Service: General;  Laterality: N/A;       Home Medications    Prior to Admission medications   Medication Sig Start Date End Date Taking? Authorizing Provider  amLODipine  (NORVASC) 10 MG tablet Take 1 tablet (10 mg total) by mouth daily. 11/11/15   Waldon Merl, PA-C  aspirin 81 MG tablet Take 81 mg by mouth daily.    Historical Provider, MD  carvedilol (COREG) 6.25 MG tablet TAKE 1 TABLET BY MOUTH TWICE DAILY WITH A MEAL 11/11/15   Waldon Merl, PA-C  Cholecalciferol (VITAMIN D-3) 1000 UNITS CAPS Take 2,000 Units by mouth daily.    Historical Provider, MD  citalopram (CELEXA) 40 MG tablet TAKE 1 TABLET BY MOUTH EVERY DAY FOR MOOD 11/11/15   Waldon Merl, PA-C  famotidine (PEPCID) 20 MG tablet TAKE 1 TABLET (20 MG TOTAL) BY MOUTH 2 (TWO) TIMES DAILY. 11/11/15   Waldon Merl, PA-C  fluticasone [facility]) 50 MCG/ACT nasal spray PLACE 1 SPRAY IN [facility] NOSTRIL EVERYDAY 03/10/16   Waldon Merl, PA-C  hydrALAZINE (APRESOLINE) 50 MG tablet Take 1 tablet (50 mg total) by mouth 2 (two) times daily. Patient taking differently: Take 50 mg by mouth 3 (three) times daily.  11/11/15   Waldon Merl, PA-C  LORazepam (ATIVAN) 1 MG tablet Take 1 tablet (1 mg total) by mouth every 8 (eight) hours as needed. for anxiety 03/14/16   Waldon Merl, PA-C  methocarbamol (ROBAXIN) 500 MG tablet TAKE 1 TABLET BY MOUTH 4 TIMES DAILY 03/21/16   Waldon Merl, PA-C  Multiple Vitamin (MULTIVITAMIN WITH MINERALS) TABS tablet Take 1 tablet by mouth daily. 02/11/15   Bernadene Person, NP  Omega-3 Fatty Acids (FISH OIL BURP-LESS PO) Take 1 tablet by mouth daily.      Historical Provider, MD  ondansetron (ZOFRAN-ODT) 4 MG disintegrating tablet DISSOLVE 1 TABLET BY MOUTH EVERY 8 HOURS AS NEEDED FOR NAUSEA OR VOMITING. 02/18/16   Waldon Merl, PA-C  oxyCODONE (ROXICODONE) 15 MG immediate release tablet Take 15 mg by mouth every 8 (eight) hours as needed for pain.    Historical Provider, MD  oxycodone (ROXICODONE) 30 MG immediate release tablet Take 1 tablet (30 mg total) by mouth every 6 (six) hours as needed for pain. 12/16/15   Shon Baton, MD    Family  History Family History  Problem Relation Age of Onset  . Hypertension Mother   . Hypertension Father     Social History Social History  Substance Use Topics  . Smoking status: Former Games developer  . Smokeless tobacco: Never Used  . Alcohol use No     Allergies   Lyrica [pregabalin]; Morphine and related; Montelukast; Neurontin [gabapentin]; and Wellbutrin [bupropion]   Review of Systems Review of Systems  All other systems reviewed and are negative.   Physical Exam Updated Vital Signs BP 137/75   Pulse (!) 56   Temp 98.2 F (36.8 C) (Oral)   Resp 22   Ht 6\' 4"  (1.93 m)   Wt 230 lb (104.3 kg)   SpO2 95%   BMI 28.00 kg/m   Physical Exam  Constitutional: He is oriented to person,  place, and time. He appears well-developed and well-nourished. No distress.  HENT:  Head: Normocephalic and atraumatic.  Right Ear: Tympanic membrane, external ear and ear canal normal.  Left Ear: Tympanic membrane, external ear and ear canal normal.  Mouth/Throat: Uvula is midline, oropharynx is clear and moist and mucous membranes are normal. No oropharyngeal exudate, posterior oropharyngeal edema or posterior oropharyngeal erythema.  Eyes: EOM are normal.  Neck: Normal range of motion. Neck supple.  No meningismus signs  Cardiovascular: Normal rate, regular rhythm and normal heart sounds.  Exam reveals no gallop and no friction rub.   No murmur heard. Pulmonary/Chest: Effort normal and breath sounds normal. No respiratory distress. He has no wheezes. He has no rales.  Abdominal: Soft. He exhibits no distension. There is no tenderness.  Musculoskeletal: Normal range of motion.  Moves all extremities.   Neurological: He is alert and oriented to person, place, and time.  Cranial nerves 2-12 intact.   Skin: Skin is warm and dry.  Healed abscess to back  Psychiatric: He has a normal mood and affect. His behavior is normal.  Nursing note and vitals reviewed.   ED Treatments / Results   DIAGNOSTIC STUDIES: Oxygen Saturation is 98% on RA, nl by my interpretation.    COORDINATION OF CARE: 9:16 PM Discussed treatment plan with pt at bedside which includes labs, CXR, toradol, zofran, CT head, and pt agreed to plan.   Labs (all labs ordered are listed, but only abnormal results are displayed) Labs Reviewed  COMPREHENSIVE METABOLIC PANEL - Abnormal; Notable for the following:       Result Value   Chloride 99 (*)    Creatinine, Ser 1.31 (*)    AST 61 (*)    ALT 101 (*)    GFR calc non Af Amer 57 (*)    All other components within normal limits  CBC WITH DIFFERENTIAL/PLATELET - Abnormal; Notable for the following:    RBC 4.12 (*)    Hemoglobin 12.6 (*)    HCT 37.7 (*)    All other components within normal limits    Radiology Dg Chest 2 View  Result Date: 03/22/2016 CLINICAL DATA:  Acute onset of shortness of breath, headache and nausea. Initial encounter. EXAM: CHEST  2 VIEW COMPARISON:  Chest radiograph performed 09/28/2015 FINDINGS: The lungs are well-aerated and clear. There is no evidence of focal opacification, pleural effusion or pneumothorax. The heart is mildly enlarged. No acute osseous abnormalities are seen. Cervical spinal fusion hardware is partially imaged. IMPRESSION: Mild cardiomegaly.  Lungs remain grossly clear. Electronically Signed   By: Roanna Raider M.D.   On: 03/22/2016 21:58   Ct Head Wo Contrast  Result Date: 03/22/2016 CLINICAL DATA:  Initial evaluation for bifrontal headache for 3 days. EXAM: CT HEAD WITHOUT CONTRAST TECHNIQUE: Contiguous axial images were obtained from the base of the skull through the vertex without intravenous contrast. COMPARISON:  Prior study from 05/29/2015. FINDINGS: Brain: Encephalomalacia within the right occipital lobe, stable. No evidence for acute large vessel territory infarct. No acute intracranial hemorrhage. No mass lesion, midline shift or mass effect. No hydrocephalus. No extra-axial fluid collection.  Vascular: No hyperdense vessel. Scattered vascular calcifications present within the carotid siphons. Skull: Scalp soft tissues demonstrate no acute abnormality. Calvarium intact. Sinuses/Orbits: Globes and orbital soft tissues demonstrate no acute abnormality. Paranasal sinuses are clear. No mastoid effusion. IMPRESSION: 1. No acute intracranial process. 2. Remote right occipital lobe infarct, stable. Electronically Signed   By: Janell Quiet.D.  On: 03/22/2016 23:19    Procedures Procedures (including critical care time)  Medications Ordered in ED Medications  prochlorperazine (COMPAZINE) injection 10 mg (0 mg Intravenous Hold 03/22/16 2238)  oxyCODONE-acetaminophen (PERCOCET/ROXICET) 5-325 MG per tablet 1 tablet (not administered)  sodium chloride 0.9 % bolus 1,000 mL (0 mLs Intravenous Stopped 03/22/16 2240)  ketorolac (TORADOL) 30 MG/ML injection 30 mg (30 mg Intravenous Given 03/22/16 2139)  ondansetron (ZOFRAN) injection 4 mg (4 mg Intravenous Given 03/22/16 2139)     Initial Impression / Assessment and Plan / ED Course  I have reviewed the triage vital signs and the nursing notes.  Pertinent labs & imaging results that were available during my care of the patient were reviewed by me and considered in my medical decision making (see chart for details).  Clinical Course    Patient is a 60 year old male presenting with headache. Patient complains that he's been feeling kind of unwell in general. Some muscle aching. Patient had nausea for the last 6 weeks. Mild rhinorrhea. Patient without reports that he had some vomiting yesterday. Patient has normal physical exam. Patient also had recent abscess on back, he lancedat home. No surrounding erythema. Do not suspect infection at this time. No meningeal signs.  I think is likely virus. We will give patient symptomatic care, I will get lab work to make sure that he is adequately hydrated.  11:25 PM Pt labs are normal. Normal CT.   Patient appears well. Do not suspect SAH given the assortment of symptoms and headache not worse at onset.   Told patient about minorly elevated LFTs, no abdominal pain. Will have him repeat with PCP.    Patient is comfortable, ambulatory, and taking PO at time of discharge.  Patient expressed understanding about return precautions.      Final Clinical Impressions(s) / ED Diagnoses   Final diagnoses:  None    New Prescriptions New Prescriptions   No medications on file   I personally performed the services described in this documentation, which was scribed in my presence. The recorded information has been reviewed and is accurate.       Joanthony Hamza [name] An, MD 03/22/16 2325

## 2016-03-23 ENCOUNTER — Other Ambulatory Visit: Payer: Self-pay | Admitting: Physician Assistant

## 2016-03-25 ENCOUNTER — Other Ambulatory Visit: Payer: Self-pay | Admitting: Physician Assistant

## 2016-03-25 NOTE — Telephone Encounter (Signed)
Zofran ODT 4mg  (20 tablets) was last refilled on 02/18/2016.   Last OV 02/16/2016 Next OV: 03/30/2016  Also, on the Patient's medication list is Zofran 4mg  (11 tablets)  that was prescribed on 03/22/2016. Please advise if the Patient should be taking both Rx at the same time. Which Rx should Patient be taking???

## 2016-03-28 ENCOUNTER — Ambulatory Visit: Payer: Medicare Other | Admitting: Physician Assistant

## 2016-03-28 ENCOUNTER — Other Ambulatory Visit: Payer: Self-pay | Admitting: Physician Assistant

## 2016-03-28 NOTE — Telephone Encounter (Signed)
Rx request to pharmacy/[facility]  

## 2016-03-30 ENCOUNTER — Encounter: Payer: Self-pay | Admitting: Physician Assistant

## 2016-03-30 ENCOUNTER — Ambulatory Visit (INDEPENDENT_AMBULATORY_CARE_PROVIDER_SITE_OTHER): Payer: Medicare Other | Admitting: Physician Assistant

## 2016-03-30 VITALS — BP 138/76 | HR 53 | Temp 98.5°F | Resp 16 | Ht 76.0 in | Wt 247.4 lb

## 2016-03-30 DIAGNOSIS — F418 Other specified anxiety disorders: Secondary | ICD-10-CM | POA: Diagnosis not present

## 2016-03-30 DIAGNOSIS — R748 Abnormal levels of other serum enzymes: Secondary | ICD-10-CM

## 2016-03-30 DIAGNOSIS — F419 Anxiety disorder, unspecified: Secondary | ICD-10-CM

## 2016-03-30 DIAGNOSIS — F329 Major depressive disorder, single episode, unspecified: Secondary | ICD-10-CM

## 2016-03-30 MED ORDER — LORAZEPAM 1 MG PO TABS: 1.0000 mg | ORAL_TABLET | Freq: Three times a day (TID) | ORAL | 1 refills | Status: DC | PRN

## 2016-03-30 MED ORDER — ONDANSETRON 4 MG PO TBDP: ORAL_TABLET | ORAL | 5 refills | Status: AC

## 2016-03-30 MED FILL — ONDANSETRON ODT 4 MG TABLET: 4 | 6 days supply | Qty: 20 | Fill #0

## 2016-03-30 NOTE — Progress Notes (Signed)
Pre visit review using our clinic review tool, if applicable. No additional management support is needed unless otherwise documented below in the visit note/SLS  

## 2016-03-30 NOTE — Patient Instructions (Signed)
Please stay well hydrated. Continue chronic medications as directed.  Stop by the lab for blood work. I will call with your results. We will alter regimen based on results.  I have sent in the refills of requested medications. Follow-up with Dr. Carmelia Roller in December as scheduled. Return sooner if needed.

## 2016-03-30 NOTE — Progress Notes (Signed)
Patient presents to clinic today for medication management and follow-up for chronic conditions. Patient is also an ER follow-up from 03/22/16. Patient presented to ER on 03/22/16 with c/o nausea/votmiting with fevers and headache. ER workup included labs with noted mild elevation in liver enzymes, normal CT head and CXR negative for infection. Symptoms felt to be related to viral illness. Supportive measures reviewed with patient. Patient was discharged home with follow-up here in office Since ER visit, patient endorses resolution of headache and fatigue. Nausea is still present but very rare, more consistent with his chronic nausea per patinet. Denies fever, chills, AMS, abdominal pain or change to bowel/bladder habits. Has been hydrating and resting well.    Anxiety/Depression -- Patient is on regimen of -- Lorazepam 1 mg every 8 hours (taking only as needed) and Citalopram 40 mg daily. Endorses taking medications as directed without side effect. Denies panic attack or acute anxiety.   Hypertension -- Patient is currently taking Coreg 6.25 mg BID and Hydralazine 50 mg TID. IS taking as directed. Patient denies chest pain, palpitations, lightheadedness, dizziness, vision changes or frequent headaches.  BP Readings from Last 3 Encounters:  03/30/16 138/76  03/22/16 151/81  02/16/16 106/68   Past Medical History:  Diagnosis Date  . Anxiety   . BPH (benign prostatic hypertrophy)   . Chest pain   . Chronic back pain   . Chronic prescription opiate use   . Depression   . DJD (degenerative joint disease)   . HTN (hypertension)   . Hypercholesteremia   . Hyperlipemia   . Obesity   . Opiate dependence, continuous ([place])   . OSA (obstructive sleep apnea)   . Seizures ([place])     Current Outpatient Prescriptions on File Prior to Visit  Medication Sig Dispense Refill  . amLODipine (NORVASC) 10 MG tablet Take 1 tablet (10 mg total) by mouth daily. 90 tablet 1  . aspirin 81 MG tablet Take 81  mg by mouth daily.    . carvedilol (COREG) 6.25 MG tablet TAKE 1 TABLET BY MOUTH TWICE DAILY WITH A MEAL 180 tablet 1  . Cholecalciferol (VITAMIN D-3) 1000 UNITS CAPS Take 2,000 Units by mouth daily.    . citalopram (CELEXA) 40 MG tablet TAKE 1 TABLET BY MOUTH EVERY DAY FOR MOOD 90 tablet 1  . famotidine (PEPCID) 20 MG tablet TAKE 1 TABLET (20 MG TOTAL) BY MOUTH 2 (TWO) TIMES DAILY. 180 tablet 1  . fluticasone (FLONASE) 50 MCG/ACT nasal spray PLACE 1 SPRAY IN EACH NOSTRIL EVERYDAY 16 g 2  . hydrALAZINE (APRESOLINE) 50 MG tablet Take 1 tablet (50 mg total) by mouth 2 (two) times daily. (Patient taking differently: Take 50 mg by mouth 3 (three) times daily. ) 180 tablet 1  . LORazepam (ATIVAN) 1 MG tablet Take 1 tablet (1 mg total) by mouth every 8 (eight) hours as needed. for anxiety 90 tablet 0  . methocarbamol (ROBAXIN) 500 MG tablet TAKE 1 TABLET BY MOUTH 4 TIMES DAILY 120 tablet 1  . Multiple Vitamin (MULTIVITAMIN WITH MINERALS) TABS tablet Take 1 tablet by mouth daily.    . Omega-3 Fatty Acids (FISH OIL BURP-LESS PO) Take 1 tablet by mouth daily.      . ondansetron (ZOFRAN) 4 MG tablet Take 1 tablet (4 mg total) by mouth every 8 (eight) hours as needed for nausea or vomiting. 11 tablet 0  . ondansetron (ZOFRAN-ODT) 4 MG disintegrating tablet DISSOLVE 1 TABLET BY MOUTH EVERY 8 HOURS AS NEEDED FOR NAUSEA  OR VOMITING. 20 tablet 0  . oxyCODONE (ROXICODONE) 15 MG immediate release tablet Take 15 mg by mouth every 8 (eight) hours as needed for pain.     No current facility-administered medications on file prior to visit.     Allergies  Allergen Reactions  . Lyrica [Pregabalin] Anaphylaxis  . Morphine And Related Anaphylaxis and Shortness Of Breath  . Montelukast Other (See Comments)    unknown  . Neurontin [Gabapentin] Other (See Comments)    delusional  . Wellbutrin [Bupropion] Palpitations    Insomnia    Family History  Problem Relation Age of Onset  . Hypertension Mother   .  Hypertension Father     Social History   Social History  . Marital status: Married    Spouse name: N/A  . Number of children: 1  . Years of education: N/A   Occupational History  . Disabled    Social History Main Topics  . Smoking status: Former Research scientist (life sciences)  . Smokeless tobacco: Never Used  . Alcohol use No  . Drug use: No  . Sexual activity: Not Asked   Other Topics Concern  . None   Social History Narrative  . None    Review of Systems - See HPI.  All other ROS are negative.  Ht '6\' 4"'$  (1.93 m)   Wt 247 lb 6 oz (112.2 kg)   BMI 30.11 kg/m   Physical Exam  Constitutional: He is oriented to person, place, and time and well-developed, well-nourished, and in no distress.  HENT:  Head: Normocephalic and atraumatic.  Eyes: Conjunctivae are normal.  Cardiovascular: Normal rate, normal heart sounds and intact distal pulses.   Pulmonary/Chest: Effort normal.  Abdominal: Soft. Bowel sounds are normal. He exhibits no distension. There is no tenderness.  Neurological: He is alert and oriented to person, place, and time.  Skin: Skin is warm and dry. No rash noted.  Psychiatric: Affect normal.  Vitals reviewed.   Recent Results (from the past 2160 hour(s))  Comprehensive metabolic panel     Status: Abnormal   Collection Time: 03/22/16  9:30 PM  Result Value Ref Range   Sodium 137 135 - 145 mmol/L   Potassium 4.0 3.5 - 5.1 mmol/L   Chloride 99 (L) 101 - 111 mmol/L   CO2 30 22 - 32 mmol/L   Glucose, Bld 98 65 - 99 mg/dL   BUN 16 6 - 20 mg/dL   Creatinine, Ser 1.31 (H) 0.61 - 1.24 mg/dL   Calcium 9.6 8.9 - 10.3 mg/dL   Total Protein 6.9 6.5 - 8.1 g/dL   Albumin 3.8 3.5 - 5.0 g/dL   AST 61 (H) 15 - 41 U/L   ALT 101 (H) 17 - 63 U/L   Alkaline Phosphatase 70 38 - 126 U/L   Total Bilirubin 0.5 0.3 - 1.2 mg/dL   GFR calc non Af Amer 57 (L) >60 mL/min   GFR calc Af Amer >60 >60 mL/min    Comment: (NOTE) The eGFR has been calculated using the CKD EPI equation. This  calculation has not been validated in all clinical situations. eGFR's persistently <60 mL/min signify possible Chronic Kidney Disease.    Anion gap 8 5 - 15  CBC with Differential/Platelet     Status: Abnormal   Collection Time: 03/22/16  9:30 PM  Result Value Ref Range   WBC 7.6 4.0 - 10.5 K/uL   RBC 4.12 (L) 4.22 - 5.81 MIL/uL   Hemoglobin 12.6 (L) 13.0 - 17.0 g/dL  HCT 37.7 (L) 39.0 - 52.0 %   MCV 91.5 78.0 - 100.0 fL   MCH 30.6 26.0 - 34.0 pg   MCHC 33.4 30.0 - 36.0 g/dL   RDW 12.6 11.5 - 15.5 %   Platelets 217 150 - 400 K/uL   Neutrophils Relative % 75 %   Neutro Abs 5.7 1.7 - 7.7 K/uL   Lymphocytes Relative 15 %   Lymphs Abs 1.1 0.7 - 4.0 K/uL   Monocytes Relative 7 %   Monocytes Absolute 0.5 0.1 - 1.0 K/uL   Eosinophils Relative 2 %   Eosinophils Absolute 0.2 0.0 - 0.7 K/uL   Basophils Relative 1 %   Basophils Absolute 0.0 0.0 - 0.1 K/uL    Assessment/Plan: 1. Elevated liver enzymes Suspected 2/2 viral illness. Patient without any noted risk factors for hepatitis. Symptoms resolved. Exam within normal limits. Will repeat liver enzymes to assess for return to normal. If still elevated or rising, will need further assessment. Zofran refilled for patient's chronic nausea. FU with GI as scheduled. - Hepatic function panel  2. Anxiety and depression Well controlled. Continue current medications.  3. Hypertension Well-controlled today. Continue current regimen.  Patient to FU with new PCP as scheduled as I am leaving current location for the [place] office.   Leeanne Rio, PA-C

## 2016-03-31 LAB — HEPATIC FUNCTION PANEL
ALT: 20 U/L (ref 0–53)
AST: 19 U/L (ref 0–37)
Albumin: 4.5 g/dL (ref 3.5–5.2)
Alkaline Phosphatase: 67 U/L (ref 39–117)
Bilirubin, Direct: 0 mg/dL (ref 0.0–0.3)
TOTAL PROTEIN: 7.3 g/dL (ref 6.0–8.3)
Total Bilirubin: 0.5 mg/dL (ref 0.2–1.2)

## 2016-04-07 MED FILL — CITALOPRAM HBR 40 MG TABLET: 40 | 90 days supply | Qty: 90 | Fill #0

## 2016-04-08 DIAGNOSIS — M961 Postlaminectomy syndrome, not elsewhere classified: Secondary | ICD-10-CM | POA: Diagnosis not present

## 2016-04-08 DIAGNOSIS — M542 Cervicalgia: Secondary | ICD-10-CM | POA: Diagnosis not present

## 2016-04-08 DIAGNOSIS — M47812 Spondylosis without myelopathy or radiculopathy, cervical region: Secondary | ICD-10-CM | POA: Diagnosis not present

## 2016-04-08 DIAGNOSIS — G894 Chronic pain syndrome: Secondary | ICD-10-CM | POA: Diagnosis not present

## 2016-04-12 MED FILL — LORazepam 1 MG TABS: 1 | 30 days supply | Qty: 90 | Fill #0

## 2016-04-13 ENCOUNTER — Emergency Department (HOSPITAL_COMMUNITY)
Admission: EM | Admit: 2016-04-13 | Discharge: 2016-04-14 | Disposition: A | Discharge status: Home / Self Care | Payer: Medicare Other | Attending: Emergency Medicine | Admitting: Emergency Medicine

## 2016-04-13 DIAGNOSIS — M545 Low back pain: Secondary | ICD-10-CM | POA: Insufficient documentation

## 2016-04-13 DIAGNOSIS — Z7982 Long term (current) use of aspirin: Secondary | ICD-10-CM | POA: Insufficient documentation

## 2016-04-13 DIAGNOSIS — T8189XA Other complications of procedures, not elsewhere classified, initial encounter: Secondary | ICD-10-CM | POA: Diagnosis not present

## 2016-04-13 DIAGNOSIS — T8131XA Disruption of external operation (surgical) wound, not elsewhere classified, initial encounter: Secondary | ICD-10-CM | POA: Diagnosis not present

## 2016-04-13 DIAGNOSIS — I130 Hypertensive heart and chronic kidney disease with heart failure and stage 1 through stage 4 chronic kidney disease, or unspecified chronic kidney disease: Secondary | ICD-10-CM | POA: Insufficient documentation

## 2016-04-13 DIAGNOSIS — G8929 Other chronic pain: Secondary | ICD-10-CM | POA: Diagnosis not present

## 2016-04-13 DIAGNOSIS — Z79899 Other long term (current) drug therapy: Secondary | ICD-10-CM | POA: Diagnosis not present

## 2016-04-13 DIAGNOSIS — I5023 Acute on chronic systolic (congestive) heart failure: Secondary | ICD-10-CM | POA: Insufficient documentation

## 2016-04-13 DIAGNOSIS — Z87891 Personal history of nicotine dependence: Secondary | ICD-10-CM | POA: Diagnosis not present

## 2016-04-13 DIAGNOSIS — M542 Cervicalgia: Secondary | ICD-10-CM | POA: Diagnosis not present

## 2016-04-13 DIAGNOSIS — N182 Chronic kidney disease, stage 2 (mild): Secondary | ICD-10-CM | POA: Diagnosis not present

## 2016-04-13 DIAGNOSIS — I252 Old myocardial infarction: Secondary | ICD-10-CM | POA: Insufficient documentation

## 2016-04-13 NOTE — ED Triage Notes (Signed)
Pt comes from home via EMS with complaints of neck and back pain. Hx of neck and back surgery.  Ambulatory with assistance to truck on arrival.  As EMS was bringing patient into the department patient was cursing at EMS and ED staff. 250 mcg of fentanyl given in route.  Pt states he feel like titanium is poking through his skin and refuses to leave without surgery. BP 154/88, HR 56, NSR, 98% RA. A&O x4.

## 2016-04-13 NOTE — ED Notes (Signed)
Bed: WA05 Expected date:  Expected time:  Means of arrival:  Comments: 

## 2016-04-14 ENCOUNTER — Encounter (HOSPITAL_COMMUNITY): Payer: Self-pay | Admitting: Emergency Medicine

## 2016-04-14 MED ORDER — KETOROLAC TROMETHAMINE 15 MG/ML IJ SOLN
15.0000 mg | Freq: Once | INTRAMUSCULAR | Status: AC
  Administered 2016-04-14: 15 mg via INTRAVENOUS
  Filled 2016-04-14: qty 1

## 2016-04-14 MED ORDER — METHOCARBAMOL 1000 MG/10ML IJ SOLN: 1000.0000 mg | Freq: Once | INTRAMUSCULAR | Status: DC

## 2016-04-14 MED ORDER — HYDROMORPHONE HCL 1 MG/ML IJ SOLN
1.0000 mg | Freq: Once | INTRAMUSCULAR | Status: AC
  Administered 2016-04-14: 1 mg via INTRAVENOUS
  Filled 2016-04-14: qty 1

## 2016-04-14 MED ORDER — METHOCARBAMOL 1000 MG/10ML IJ SOLN
1000.0000 mg | Freq: Once | INTRAVENOUS | Status: AC
  Administered 2016-04-14: 1000 mg via INTRAVENOUS
  Filled 2016-04-14: qty 10

## 2016-04-14 NOTE — ED Provider Notes (Signed)
[facility]-EMERGENCY DEPT Provider Note   CSN: [id number] Arrival date & time: 04/13/16  2348     History   Chief Complaint Chief Complaint  Patient presents with  . Neck Pain  . Back Pain    HPI Jared [name] is a 60 y.o. male.  Patient with chronic neck and lower back pain and history of lumbar fusion presents with uncontrolled pain. He was referred to Pain Management and has been on a regular regimen of oxycodone for several months. He states that it is not controlling his pain and he feels he needs surgery to fix the problem. He is requesting surgical consultation in the ED. He denies abdominal pain, chest pain, fever, SOB, numbness, weakness, urinary/bowel incontinence.    The history is provided by the patient. No language interpreter was used.    Past Medical History:  Diagnosis Date  . Anxiety   . BPH (benign prostatic hypertrophy)   . Chest pain   . Chronic back pain   . Chronic prescription opiate use   . Depression   . DJD (degenerative joint disease)   . HTN (hypertension)   . Hypercholesteremia   . Hyperlipemia   . Obesity   . Opiate dependence, continuous (HCC)   . OSA (obstructive sleep apnea)   . Seizures [facility])     Patient Active Problem List   Diagnosis Date Noted  . History of stroke 02/03/2016  . Generalized anxiety disorder 09/03/2015  . Depression with anxiety 07/22/2015  . Psychosomatic factor in physical condition 07/22/2015  . Anxiety and depression 07/22/2015  . Pain in the chest   . PRES (posterior reversible encephalopathy syndrome) 05/18/2015  . Provoked seizures (HCC) 05/18/2015  . Abdominal pain   . Leukocytosis   . Chronic atrial fibrillation (HCC)   . Chronic systolic heart failure (HCC)   . Hyperkalemia   . Chronic kidney disease   . Thrombocytopenia (HCC)   . Paroxysmal atrial fibrillation (HCC)   . Acute systolic heart failure (HCC)   . Pulmonary hypertension   . Chronic back pain 01/18/2015  . NSTEMI (non-ST elevated  myocardial infarction) (HCC) 01/13/2015  . Cardiogenic shock (HCC)   . CKD Stage 2 (GFR 72 ml/min) 02/04/2014  . Mixed hyperlipidemia 05/22/2013  . Prediabetes 05/22/2013  . OSA on CPAP 05/22/2013  . Reflux esophagitis 05/22/2013  . Testosterone deficiency 05/22/2013  . Vitamin D deficiency 05/22/2013  . DDD (degenerative disc disease), lumbar 05/22/2013  . Chronic pain syndrome 05/22/2013  . HTN (hypertension) 01/20/2011    Past Surgical History:  Procedure Laterality Date  . APPLICATION OF WOUND VAC N/A 01/08/2015   Procedure: APPLICATION OF WOUND VAC;  Surgeon: Manus Rudd, MD;  Location: [facility] OR;  Service: General;  Laterality: N/A;  . BACK SURGERY     Lspine w/Hardware  . CARDIAC CATHETERIZATION N/A 01/13/2015   Procedure: Left Heart Cath and Coronary Angiography;  Surgeon: Corky Crafts, MD;  Location: [facility] INVASIVE CV LAB;  Service: Cardiovascular;  Laterality: N/A;  . CERVICAL SPINE SURGERY     w/Hardware  . LAPAROTOMY N/A 01/08/2015   Procedure: EXPLORATORY LAPAROTOMY;  Surgeon: Manus Rudd, MD;  Location: [facility] OR;  Service: General;  Laterality: N/A;  . PROSTATECTOMY    . WOUND DEBRIDEMENT N/A 01/11/2015   Procedure: DEBRIDEMENT WOUND AND VAC DRESSING CHANGE;  Surgeon: Manus Rudd, MD;  Location: [facility] OR;  Service: General;  Laterality: N/A;       Home Medications    Prior to Admission medications  Medication Sig Start Date End Date Taking? Authorizing Provider  amLODipine (NORVASC) 10 MG tablet Take 1 tablet (10 mg total) by mouth daily. 11/11/15   Waldon MerlWilliam C Martin, PA-C  aspirin 81 MG tablet Take 81 mg by mouth daily.    Historical Provider, MD  carvedilol (COREG) 6.25 MG tablet TAKE 1 TABLET BY MOUTH TWICE DAILY WITH A MEAL 11/11/15   Waldon MerlWilliam C Martin, PA-C  Cholecalciferol (VITAMIN D-3) 1000 UNITS CAPS Take 2,000 Units by mouth daily.    Historical Provider, MD  citalopram (CELEXA) 40 MG tablet TAKE 1 TABLET BY MOUTH EVERY DAY FOR MOOD 11/11/15   Waldon MerlWilliam C  Martin, PA-C  famotidine (PEPCID) 20 MG tablet TAKE 1 TABLET (20 MG TOTAL) BY MOUTH 2 (TWO) TIMES DAILY. 11/11/15   Waldon MerlWilliam C Martin, PA-C  fluticasone [facility](FLONASE) 50 MCG/ACT nasal spray PLACE 1 [facility] EVERY[facility] 03/10/16   Waldon MerlWilliam C Martin, PA-C  hydrALAZINE (APRESOLINE) 50 MG tablet Take 1 tablet (50 mg total) by mouth 2 (two) times daily. Patient taking differently: Take 50 mg by mouth 3 (three) times daily.  11/11/15   Waldon MerlWilliam C Martin, PA-C  LORazepam (ATIVAN) 1 MG tablet Take 1 tablet (1 mg total) by mouth every 8 (eight) hours as needed. for anxiety 03/30/16   Waldon MerlWilliam C Martin, PA-C  methocarbamol (ROBAXIN) 500 MG tablet TAKE 1 TABLET BY MOUTH 4 TIMES DAILY 03/21/16   Waldon MerlWilliam C Martin, PA-C  Multiple Vitamin (MULTIVITAMIN WITH MINERALS) TABS tablet Take 1 tablet by mouth daily. 02/11/15   Bernadene PersonKathryn A Whiteheart, NP  Omega-3 Fatty Acids (FISH OIL BURP-LESS PO) Take 1 tablet by mouth daily.      Historical Provider, MD  ondansetron (ZOFRAN-ODT) 4 MG disintegrating tablet DISSOLVE 1 TABLET BY MOUTH EVERY 8 HOURS AS NEEDED FOR NAUSEA OR VOMITING. 03/30/16   Waldon MerlWilliam C Martin, PA-C  oxyCODONE (ROXICODONE) 15 MG immediate release tablet Take 15 mg by mouth every 8 (eight) hours as needed for pain.    Historical Provider, MD  oxycodone (ROXICODONE) 30 MG immediate release tablet Take 30 mg by mouth 4 (four) times daily.    Historical Provider, MD    Family History Family History  Problem Relation Age of Onset  . Hypertension Mother   . Hypertension Father     Social History Social History  Substance Use Topics  . Smoking status: Former Games developermoker  . Smokeless tobacco: Never Used  . Alcohol use No     Allergies   Lyrica [pregabalin]; Morphine and related; Montelukast; Neurontin [gabapentin]; and Wellbutrin [bupropion]   Review of Systems Review of Systems  Constitutional: Negative for chills and fever.  Respiratory: Negative.   Cardiovascular: Negative.   Gastrointestinal:  Negative.  Negative for abdominal pain.  Genitourinary: Negative.  Negative for enuresis.  Musculoskeletal:       See HPI.  Neurological: Negative.  Negative for weakness and numbness.     Physical Exam Updated Vital Signs BP 116/85 (BP Location: Right Arm)   Pulse (!) 56   Temp 98.2 F (36.8 C) (Oral)   Resp 16   Ht 6\' 4"  (1.93 m)   Wt 112 kg   SpO2 97%   BMI 30.07 kg/m   Physical Exam  Constitutional: He is oriented to person, place, and time. He appears well-developed and well-nourished.  Uncomfortable appearing.  HENT:  Head: Normocephalic.  Neck: Normal range of motion.  Pulmonary/Chest: Effort normal.  Abdominal: Soft. He exhibits no mass. There is no tenderness.  Musculoskeletal: Normal range  of motion.  Bilateral paralumbar and midline lumbar tenderness without swelling, discoloration. Distal pulses 2+. FROM all extremities.  Neurological: He is alert and oriented to person, place, and time. He has normal reflexes. No sensory deficit.  Reflexes in LE's equal and symmetric.   Skin: Skin is warm and dry.  Psychiatric: His affect is angry. He is agitated.     ED Treatments / Results  Labs (all labs ordered are listed, but only abnormal results are displayed) Labs Reviewed - No data to display  EKG  EKG Interpretation None       Radiology No results found.  Procedures Procedures (including critical care time)  Medications Ordered in ED Medications  methocarbamol (ROBAXIN) 1,000 mg in dextrose 5 % 50 mL IVPB (not administered)  ketorolac (TORADOL) 15 MG/ML injection 15 mg (15 mg Intravenous Given 04/14/16 0225)  HYDROmorphone (DILAUDID) injection 1 mg (1 mg Intravenous Given 04/14/16 0224)     Initial Impression / Assessment and Plan / ED Course  I have reviewed the triage vital signs and the nursing notes.  Pertinent labs & imaging results that were available during my care of the patient were reviewed by me and considered in my medical decision  making (see chart for details).  Clinical Course   Patient with chronic pain presents with complaint of uncontrolled pain.  Exam is unremarkable without evidence of acute neurologic deficit. Patient was given IV medications to address his pain, including Dilaudid, Robaxin and Toradol. On re-evaluation, the patient complains of unimproved pain and is requesting an IM Dilaudid shot.Discussed that everything that could be done was done tonight and he would need to follow up with his pain management doctor. The patient stated "I'll come up and grab you by the throat".   The patient was discussed with Dr. Lynelle Doctor. He can be discharged home appropriately.  Final Clinical Impressions(s) / ED Diagnoses   Final diagnoses:  None  1. Chronic pain  New Prescriptions New Prescriptions   No medications on file     Elpidio Anis, PA-C 04/14/16 0411    Devoria Albe, MD 04/14/16 [phone]

## 2016-04-14 NOTE — ED Notes (Signed)
Patient ripping call bell out of wall and slamming it in the floor.  Screaming doctor and bull----.  Let PA know.

## 2016-04-14 NOTE — ED Notes (Signed)
Patient repeatedly stating that he is suing this hospital and that there is a recorder in his room that is recording everything going on.  States that he hates the [facility] system and we better get him some pain medicine.

## 2016-04-14 NOTE — ED Notes (Signed)
Pt outraged, cursing using "f" words toward staff. Pt redirected but not cooperating. Pulled IV out and want to leave.

## 2016-04-18 MED FILL — METHOCARBAMOL 500 MG TABLET: 500 | 30 days supply | Qty: 120 | Fill #1

## 2016-04-21 ENCOUNTER — Other Ambulatory Visit: Payer: Self-pay

## 2016-04-21 NOTE — Patient Outreach (Signed)
[facility]) Care Management  04/21/2016  Jared [name] Raven December 28, 1955 [id number]   Referral Date: 04/15/2016 Referral Source: ED utilization Referral Reason: High ED Utilization Outreach Attempt: First Attempt Successful Social: Patient lives with wife.  Patient able to bathe and dress himself.  Patient reports he lies down most of the time due to his back issues. Patient reports that his wife assists him as needed.    Conditions: Patient had several code situations last year and spent months in the hospital and feels, he is about 65% recuperated and that he pushes himself to do more and sometimes pays for it in pain. Patient reports chronic back pain due to back issues for 22 years and is in pain management.  Patient reports he does get in trouble with pain when he does too much.  Patient rates pain regularly at 4-5/10. Patient taking oxycodone routinely and as needed.  Patient reports that some history of depression and anxiety. Patient denies any problems presently. Patient also states he has hypertension but is controlled with medications. Patient also reports that he started going to the [facility] for water exercises but it only aggravated his pain so he will be starting therapy again in January.    Medications: Patient has no problems or questions about his medication.  Services: Patient has declined services at this time as he feels he is doing ok except for when he is hard headed and does more than he should.  Plan:  RN Health Coach will send letter and brochure to patient. RN Health Coach will notify care management assistant of patient status.  Bary Leriche, RN, MSN [facility] Care Management RN Telephonic Health Coach [phone]

## 2016-04-27 ENCOUNTER — Ambulatory Visit: Payer: Medicare Other | Admitting: Neurology

## 2016-05-04 ENCOUNTER — Telehealth: Payer: Self-pay | Admitting: Physician Assistant

## 2016-05-04 ENCOUNTER — Ambulatory Visit (INDEPENDENT_AMBULATORY_CARE_PROVIDER_SITE_OTHER): Payer: Medicare Other | Admitting: Family Medicine

## 2016-05-04 ENCOUNTER — Encounter: Payer: Self-pay | Admitting: Family Medicine

## 2016-05-04 ENCOUNTER — Ambulatory Visit: Payer: Medicare Other | Admitting: Physician Assistant

## 2016-05-04 VITALS — BP 110/52 | HR 55 | Temp 98.3°F | Ht 76.0 in | Wt 257.6 lb

## 2016-05-04 DIAGNOSIS — Z23 Encounter for immunization: Secondary | ICD-10-CM

## 2016-05-04 DIAGNOSIS — I10 Essential (primary) hypertension: Secondary | ICD-10-CM

## 2016-05-04 DIAGNOSIS — M545 Low back pain, unspecified: Secondary | ICD-10-CM

## 2016-05-04 DIAGNOSIS — G8929 Other chronic pain: Secondary | ICD-10-CM

## 2016-05-04 MED ORDER — METHOCARBAMOL 500 MG PO TABS: ORAL_TABLET | ORAL | 1 refills | Status: AC

## 2016-05-04 NOTE — Patient Instructions (Signed)
[facility] [address] [place], [place] [address] [phone]  [facility] [address] [place], [place] [address] [phone]  [facility] [address] [place], [place] [address] [phone]  Dr. Andee Poles [address] [place], [place] [address] [phone]

## 2016-05-04 NOTE — Telephone Encounter (Signed)
Patient is requesting a transfer of care from Dr. Carmelia Roller to Sandford Craze. Dr. Carmelia Roller as approved this, please advise.

## 2016-05-04 NOTE — Progress Notes (Signed)
Chief Complaint  Patient presents with  . Transitions Of Care    follow-up 6 months    Subjective Jared [name] is a 60 y.o. male who presents for hypertension follow up. He does monitor home blood pressures. Blood pressures ranging from 110's/70's on average. He is compliant with medications- Norvasc10 mg daily, Hydralazine 50 mg TID. Patient has these side effects of medication: none He is adhering to a low sodium and low fat diet. Current exercise: no routine exercise   Past Medical History:  Diagnosis Date  . Anxiety   . BPH (benign prostatic hypertrophy)   . Chest pain   . Chronic back pain   . Chronic prescription opiate use   . Depression   . DJD (degenerative joint disease)   . HTN (hypertension)   . Hypercholesteremia   . Hyperlipemia   . Obesity   . Opiate dependence, continuous (HCC)   . OSA (obstructive sleep apnea)   . Seizures (HCC)    Family History  Problem Relation Age of Onset  . Hypertension Mother   . Hypertension Father     Medications Current Outpatient Prescriptions on File Prior to Visit  Medication Sig Dispense Refill  . amLODipine (NORVASC) 10 MG tablet Take 1 tablet (10 mg total) by mouth daily. 90 tablet 1  . aspirin 81 MG tablet Take 81 mg by mouth daily.    . carvedilol (COREG) 6.25 MG tablet TAKE 1 TABLET BY MOUTH TWICE DAILY WITH A MEAL 180 tablet 1  . Cholecalciferol (VITAMIN D-3) 1000 UNITS CAPS Take 2,000 Units by mouth daily.    . citalopram (CELEXA) 40 MG tablet TAKE 1 TABLET BY MOUTH EVERY DAY FOR MOOD 90 tablet 1  . famotidine (PEPCID) 20 MG tablet TAKE 1 TABLET (20 MG TOTAL) BY MOUTH 2 (TWO) TIMES DAILY. 180 tablet 1  . fluticasone (FLONASE) 50 MCG/ACT nasal spray PLACE 1 SPRAY IN EACH NOSTRIL EVERYDAY 16 g 2  . hydrALAZINE (APRESOLINE) 50 MG tablet Take 1 tablet (50 mg total) by mouth 2 (two) times daily. (Patient taking differently: Take 50 mg by mouth 3 (three) times daily. ) 180 tablet 1  . LORazepam (ATIVAN) 1 MG tablet  Take 1 tablet (1 mg total) by mouth every 8 (eight) hours as needed. for anxiety 90 tablet 1  . methocarbamol (ROBAXIN) 500 MG tablet TAKE 1 TABLET BY MOUTH 4 TIMES DAILY 120 tablet 1  . Multiple Vitamin (MULTIVITAMIN WITH MINERALS) TABS tablet Take 1 tablet by mouth daily.    . Omega-3 Fatty Acids (FISH OIL BURP-LESS PO) Take 1 tablet by mouth daily.      . ondansetron (ZOFRAN-ODT) 4 MG disintegrating tablet DISSOLVE 1 TABLET BY MOUTH EVERY 8 HOURS AS NEEDED FOR NAUSEA OR VOMITING. 20 tablet 5  . oxyCODONE (ROXICODONE) 15 MG immediate release tablet Take 15 mg by mouth every 8 (eight) hours as needed for pain.    Marland Kitchen. oxycodone (ROXICODONE) 30 MG immediate release tablet Take 30 mg by mouth 4 (four) times daily.     Allergies Allergies  Allergen Reactions  . Lyrica [Pregabalin] Anaphylaxis  . Morphine And Related Anaphylaxis and Shortness Of Breath  . Montelukast Other (See Comments)    unknown  . Neurontin [Gabapentin] Other (See Comments)    delusional  . Prednisone   . Wellbutrin [Bupropion] Palpitations    Insomnia    Review of Systems Cardiovascular: no chest pain Respiratory:  no shortness of breath  Exam BP (!) 110/52 (BP Location: Left Arm, Patient  Position: Sitting, Cuff Size: Large)   Pulse (!) 55   Temp 98.3 F (36.8 C) (Oral)   Ht 6\' 4"  (1.93 m)   Wt 257 lb 9.6 oz (116.8 kg)   SpO2 97%   BMI 31.36 kg/m  General:  well developed, well nourished, in no apparent distress Neck: neck supple without adenopathy, thyromegaly, masses Heart :RRR, no murmurs, no bruits, no LE edema Lungs:  clear to auscultation, breath sounds equal bilaterally Psych: well oriented with normal range of affect and appropriate judgment/insight  Essential hypertension  Chronic bilateral low back pain without sciatica - Plan: methocarbamol (ROBAXIN) 500 MG tablet  Orders as above. Well controlled, keep on same regimen. F/u in 6 mo. He is on chronic narcotics in addition to Ativan. I stated  that I Jared [name] not rx chronic benzodiazepines like Ativan during concurrent chronic narcotic use without the supervision of a psychiatrist. I offered to rx an alternative in addition to providing psychiatric resources. He cordially declined and I then offered to see if another provider in the office would be willing to rx this for him. He agreed to this. The patient voiced understanding and agreement to the plan.  Jilda Roche [place], Jared [name] 05/04/16  3:11 PM

## 2016-05-04 NOTE — Addendum Note (Signed)
Addended by: Verdie Shire on: 05/04/2016 04:59 PM   Modules accepted: Orders

## 2016-05-06 NOTE — Telephone Encounter (Signed)
Ok

## 2016-05-10 ENCOUNTER — Other Ambulatory Visit: Payer: Self-pay | Admitting: Physician Assistant

## 2016-05-10 MED FILL — hydrALAZINE HCL 50 MG TABS: 50 | 90 days supply | Qty: 180 | Fill #1

## 2016-05-10 MED FILL — LORazepam 1 MG TABS: 1 | 30 days supply | Qty: 90 | Fill #1

## 2016-05-10 MED FILL — CARVEDILOL 6.25 MG TABLET: 6.25 | 90 days supply | Qty: 180 | Fill #1

## 2016-05-10 MED FILL — ONDANSETRON ODT 4 MG TABLET: 4 | 6 days supply | Qty: 20 | Fill #1

## 2016-05-11 MED FILL — METHOCARBAMOL 500 MG TABLET: 500 | 30 days supply | Qty: 120 | Fill #0

## 2016-05-11 MED FILL — FAMOTIDINE 20 MG TABLET: 20 | 90 days supply | Qty: 180 | Fill #0

## 2016-05-13 MED FILL — AMLODIPINE BESYLATE 10 MG T: 10 | 90 days supply | Qty: 90 | Fill #1

## 2016-05-19 MED FILL — ONDANSETRON ODT 4 MG TABLET: 4 | 6 days supply | Qty: 20 | Fill #2

## 2016-05-24 ENCOUNTER — Ambulatory Visit: Payer: Medicare Other | Admitting: Family

## 2016-06-07 MED FILL — METHOCARBAMOL 500 MG TABLET: 500 | 30 days supply | Qty: 120 | Fill #1

## 2016-06-10 ENCOUNTER — Telehealth: Payer: Self-pay | Admitting: *Deleted

## 2016-06-10 NOTE — Telephone Encounter (Signed)
Scheduled for Tuesday @10am 

## 2016-06-13 NOTE — Progress Notes (Signed)
Subjective:   Jared [name] is a 61 y.o. male who presents for an Initial Medicare Annual Wellness Visit.  Review of Systems  No ROS.  Medicare Wellness Visit.  Cardiac Risk Factors include: advanced age (>75mn, >>40women);dyslipidemia;hypertension;male gender Sleep patterns: Difficulty getting to sleep. Sleeps 7-8 hrs per night. Feels rested. Home Safety/Smoke Alarms:  Feels safe in home. Smoke alarms in place.   Living environment; residence and Firearm Safety: Lives at home with wife and dog. Guns safely stored. Seat Belt Safety/Bike Helmet: Wears seat belt.   Counseling:   Eye Exam- No eye doctor.  Dental- Dr. MBelva Jared [name] 6 months.  Male:   CCS- Last 03/19/10 per external report. PSA-  Lab Results  Component Value Date   PSA 0.95 08/18/2014   PSA 2.00 07/15/2013       Objective:    Today's Vitals   06/14/16 1027  BP: 140/80  Pulse: (!) 58  SpO2: 98%  Weight: 243 lb (110.2 kg)  Height: _0  (1.93 m)   Body mass index is 29.58 kg/m.  Current Medications (verified) Outpatient Encounter Prescriptions as of 06/14/2016  Medication Sig  . amLODipine (NORVASC) 10 MG tablet Take 1 tablet (10 mg total) by mouth daily.  .Marland Kitchenaspirin 81 MG tablet Take 81 mg by mouth daily.  . carvedilol (COREG) 6.25 MG tablet TAKE 1 TABLET BY MOUTH TWICE DAILY WITH A MEAL  . Cholecalciferol (VITAMIN D-3) 1000 UNITS CAPS Take 2,000 Units by mouth daily.  . citalopram (CELEXA) 40 MG tablet TAKE 1 TABLET BY MOUTH EVERY DAY FOR MOOD  . famotidine (PEPCID) 20 MG tablet TAKE 1 TABLET (20 MG TOTAL) BY MOUTH 2 (TWO) TIMES DAILY.  . fluticasone (FLONASE) 50 MCG/ACT nasal spray PLACE 1 SPRAY IN EACH NOSTRIL EVERYDAY  . hydrALAZINE (APRESOLINE) 50 MG tablet Take 1 tablet (50 mg total) by mouth 2 (two) times daily. (Patient taking differently: Take 50 mg by mouth 3 (three) times daily. )  . methocarbamol (ROBAXIN) 500 MG tablet TAKE 1 TABLET BY MOUTH 4 TIMES DAILY  . Multiple Vitamin (MULTIVITAMIN  WITH MINERALS) TABS tablet Take 1 tablet by mouth daily.  . Omega-3 Fatty Acids (FISH OIL BURP-LESS PO) Take 1 tablet by mouth daily.    . ondansetron (ZOFRAN-ODT) 4 MG disintegrating tablet DISSOLVE 1 TABLET BY MOUTH EVERY 8 HOURS AS NEEDED FOR NAUSEA OR VOMITING.  . LORazepam (ATIVAN) 1 MG tablet Take 1 tablet (1 mg total) by mouth every 8 (eight) hours as needed. for anxiety (Patient not taking: Reported on 06/14/2016)  . oxycodone (ROXICODONE) 30 MG immediate release tablet Take 30 mg by mouth daily.   . [DISCONTINUED] oxyCODONE (ROXICODONE) 15 MG immediate release tablet Take 15 mg by mouth every 8 (eight) hours as needed for pain.   No facility-administered encounter medications on file as of 06/14/2016.     Allergies (verified) Lyrica [pregabalin]; Morphine and related; Montelukast; Neurontin [gabapentin]; Prednisone; and Wellbutrin [bupropion]   History: Past Medical History:  Diagnosis Date  . Anxiety   . BPH (benign prostatic hypertrophy)   . Chest pain   . Chronic back pain   . Chronic prescription opiate use   . Depression   . DJD (degenerative joint disease)   . HTN (hypertension)   . Hypercholesteremia   . Hyperlipemia   . Obesity   . O[place]ate dependence, continuous ([facility]   . OSA (obstructive sleep apnea)   . Seizures ([place]    Past Surgical History:  Procedure Laterality Date  .  APPLICATION OF WOUND VAC N/A 01/08/2015   Procedure: APPLICATION OF WOUND VAC;  Surgeon: Donnie Mesa, MD;  Location: [place];  Service: General;  Laterality: N/A;  . BACK SURGERY     Lspine w/Hardware  . CARDIAC CATHETERIZATION N/A 01/13/2015   Procedure: Left Heart Cath and Coronary Angiography;  Surgeon: Jettie Booze, MD;  Location: [facility];  Service: Cardiovascular;  Laterality: N/A;  . CERVICAL SPINE SURGERY     w/Hardware  . LAPAROTOMY N/A 01/08/2015   Procedure: EXPLORATORY LAPAROTOMY;  Surgeon: Donnie Mesa, MD;  Location: [place];  Service: General;  Laterality: N/A;    . PROSTATECTOMY    . WOUND DEBRIDEMENT N/A 01/11/2015   Procedure: DEBRIDEMENT WOUND AND VAC DRESSING CHANGE;  Surgeon: Donnie Mesa, MD;  [facility];  Service: General;  Laterality: N/A;   Family History  Problem Relation Age of Onset  . Hypertension Mother   . Hypertension Father    Social History   Occupational History  . Disabled    Social History Main Topics  . Smoking status: Former Research scientist (life sciences)  . Smokeless tobacco: Never Used  . Alcohol use No  . Drug use: No  . Sexual activity: Not on file   Tobacco Counseling Counseling given: Not Answered   Activities of Daily Living In your present state of health, do you have any difficulty performing the following activities: 06/14/2016 05/04/2016  Hearing? N N  Vision? N N  Difficulty concentrating or making decisions? N Y  Walking or climbing stairs? N Y  Dressing or bathing? N N  Doing errands, shopping? N N  Preparing Food and eating ? N -  Using the Toilet? N -  In the past six months, have you accidently leaked urine? N -  Do you have problems with loss of bowel control? N -  Managing your Medications? N -  Managing your Finances? N -  Housekeeping or managing your Housekeeping? N -  Some recent data might be hidden    Immunizations and Health Maintenance Immunization History  Administered Date(s) Administered  . DT 08/18/2014  . Influenza,inj,Quad PF,36+ Mos 02/16/2016  . Influenza-Unspecified 05/12/2011, 02/11/2015  . Pneumococcal-Unspecified 05/23/2006  . Td 03/24/2005  . Tdap 05/04/2016   There are no preventive care reminders to display for this patient.  No care team member to display  Indicate any recent Medical Services you may have received from other than [facility] providers in the past year (date may be approximate).    Assessment:   This is a routine wellness examination for Jared [name]. Physical assessment deferred to PCP.   Hearing/Vision screen  Hearing Screening   _0  _1  _2  _3  _4   _5  _6  _7  _8   Right ear:   Pass Pass Pass  Fail    Left ear:   Pass Pass Pass  Fail      Visual Acuity Screening   Right eye Left eye Both eyes  Without correction: _9  With correction:       Dietary issues and exercise activities discussed: Current Exercise Habits: Home exercise routine, Type of exercise: walking, Time (Minutes): 30, Frequency (Times/Week): 7, Weekly Exercise (Minutes/Week): 210, Intensity: Mild Diet (meal preparation, eat out, water intake, caffeinated beverages, dairy products, fruits and vegetables): not asked, on average, 3 meals per day   Goals    . <enter goal here>          Pt would like to properly get off pain meds and get anxiety under control.  Depression Screen PHQ 2/9 Scores 06/14/2016 04/21/2016  PHQ - 2 Score 0 0    Fall Risk Fall Risk  06/14/2016 02/03/2016  Falls in the past year? Yes No  Number falls in past yr: 1 -  Injury with Fall? Yes -  Follow up Education provided;Falls prevention discussed -    Cognitive Function: MMSE - Mini Mental State Exam 06/14/2016  Orientation to time 5  Orientation to Place 5  Registration 3  Attention/ Calculation 5  Recall 2  Language- name 2 objects 2  Language- repeat 1  Language- follow 3 step command 3  Language- read & follow direction 1  Write a sentence 1  Copy design 1  Total score 29        Screening Tests Health Maintenance  Topic Date Due  . ZOSTAVAX  06/14/2017 (Originally 06/22/2013)  . Hepatitis C Screening  06/14/2017 (Originally June 20, 1955)  . COLONOSCOPY  03/19/2020  . TETANUS/TDAP  05/04/2026  . INFLUENZA VACCINE  Completed  . HIV Screening  Completed    PT CONCERNS: Pt presents today very angry and upset. He states that he is furious with [place] and that Medicare is furious as well. Pt states he is trying to wean himself off of pain meds. He states that the Comptroller of staff doctor" at [facility] sent him to our practice for a PCP but did not  tell him that he would have to see a "Jap". He states that the "Jap doctor" refused to fill his ativan and would never make it at [facility]. He states that he was thrown out of the practice of Neuroscience in [facility] for "giving them hell" for not refilling his meds. He states that if he doesn't get his ativan refilled today, that he will report [place] all to the Chiefs of Staff and to the [facility] Superiors. I advised Mr.Oertel that I was sorry that he felt as though his needs were not being met and to please bring forth these concerns to Debbrah Alar NP at his appt with her this morning.     Plan:     Follow up with Melissa as scheduled.  During the course of the visit Jordynn was educated and counseled about the following appropriate screening and preventive services:   Vaccines to include Pneumoccal, Influenza, Hepatitis B, Td, Zostavax, HCV  Colorectal cancer screening  Cardiovascular disease screening  Diabetes screening  Glaucoma screening  Nutrition counseling  Prostate cancer screening   Patient Instructions (the written plan) were given to the patient.   Shela Nevin, [place]   06/14/2016

## 2016-06-13 NOTE — Progress Notes (Signed)
Pre visit review using our clinic review tool, if applicable. No additional management support is needed unless otherwise documented below in the visit note. 

## 2016-06-14 ENCOUNTER — Encounter: Payer: Self-pay | Admitting: Family

## 2016-06-14 ENCOUNTER — Ambulatory Visit: Payer: Medicare Other | Admitting: Family

## 2016-06-14 ENCOUNTER — Ambulatory Visit (INDEPENDENT_AMBULATORY_CARE_PROVIDER_SITE_OTHER): Payer: Medicare Other | Admitting: Family

## 2016-06-14 VITALS — BP 140/80 | HR 58 | Ht 76.0 in | Wt 243.0 lb

## 2016-06-14 DIAGNOSIS — I252 Old myocardial infarction: Secondary | ICD-10-CM | POA: Diagnosis not present

## 2016-06-14 DIAGNOSIS — F419 Anxiety disorder, unspecified: Secondary | ICD-10-CM

## 2016-06-14 DIAGNOSIS — F418 Other specified anxiety disorders: Secondary | ICD-10-CM

## 2016-06-14 DIAGNOSIS — Z0000 Encounter for general adult medical examination without abnormal findings: Secondary | ICD-10-CM

## 2016-06-14 DIAGNOSIS — F329 Major depressive disorder, single episode, unspecified: Secondary | ICD-10-CM

## 2016-06-14 DIAGNOSIS — I10 Essential (primary) hypertension: Secondary | ICD-10-CM | POA: Diagnosis not present

## 2016-06-14 DIAGNOSIS — Z79899 Other long term (current) drug therapy: Secondary | ICD-10-CM | POA: Diagnosis not present

## 2016-06-14 DIAGNOSIS — G894 Chronic pain syndrome: Secondary | ICD-10-CM

## 2016-06-14 MED ORDER — LORAZEPAM 1 MG PO TABS: 1.0000 mg | ORAL_TABLET | Freq: Three times a day (TID) | ORAL | 0 refills | Status: AC | PRN

## 2016-06-14 MED FILL — LORazepam 1 MG TABS: 1 | 30 days supply | Qty: 90 | Fill #0

## 2016-06-14 NOTE — Progress Notes (Signed)
Subjective:    Patient ID: GAL FELDHAUS, male    DOB: Feb 04, 1956, 61 y.o.   MRN: [id number]  HPI  Mr. Maione is a 61 yr old male who presents today in transfer from another provider at our office.   Pmhx is significant for the following:  Anxiety-  He denies history of depression.  He reports that he has never seen psychiatry.  "I don't have a psychiatric diagnosis."  Reports that he has been well controlled on lorazapam and citalopram until he ran out of lorazepam 3 days ago.  He reports that he was very upset with his previous visit with Dr. Carmelia Roller because "they set me up with this Jap Doctor without telling me and he refused to refill my lorazepam." He proceeds to say that he was told by the chief of medicine at [facility] that he should report back to them "if I have any trouble getting my refills."  Hx of CVA and CAD- 2016, has seen Dr. Karel Jarvis.  Pt denies hx of CAD however he had a cardiac cath which noted CAD back in 2016.  (note was made of 100% stenosis of second obtus marginal branch of the left circumflex, otherwise unremarkable cath.   Chronic Atrial fibrillation- Dx is in pt's chart. Pt denies hx of anticoagulation. He has seen Dr. Eden Emms in the past. No note of CAF. EKG 09/30/15 noted NSR.    DDD/Chronic low back pain- on oxycodone.  He is followed by pain management.   HTN- maintained on amlodipine, coreg, hydralazine.  BP Readings from Last 3 Encounters:  06/14/16 140/80  05/04/16 (!) 110/52  04/14/16 128/78   Reports that he was discharged from pain management and is is the process of weaning himself off of his pain medication.    Review of Systems    see HPI  Past Medical History:  Diagnosis Date  . Anxiety   . BPH (benign prostatic hypertrophy)   . Chest pain   . Chronic back pain   . Chronic prescription opiate use   . Depression   . DJD (degenerative joint disease)   . HTN (hypertension)   . Hypercholesteremia   . Hyperlipemia   . Obesity   .  Opiate dependence, continuous (H[facility])   . OSA (obstructive sleep apnea)   . Seizures [facility])      Social History   Social History  . Marital status: Married    Spouse name: N/A  . Number of children: 1  . Years of education: N/A   Occupational History  . Disabled    Social History Main Topics  . Smoking status: Former Games developer  . Smokeless tobacco: Never Used  . Alcohol use No  . Drug use: No  . Sexual activity: Not on file   Other Topics Concern  . Not on file   Social History Narrative  . No narrative on file    Past Surgical History:  Procedure Laterality Date  . APPLICATION OF WOUND VAC N/A 01/08/2015   Procedure: APPLICATION OF WOUND VAC;  Surgeon: Manus Rudd, MD;  [facility];  Service: General;  Laterality: N/A;  . BACK SURGERY     Lspine w/Hardware  . CARDIAC CATHETERIZATION N/A 01/13/2015   Procedure: Left Heart Cath and Coronary Angiography;  Surgeon: Corky Crafts, MD;  Location: [facility] INVASIVE [facility];  Service: Cardiovascular;  Laterality: N/A;  . CERVICAL SPINE SURGERY     w/Hardware  . LAPAROTOMY N/A 01/08/2015   Procedure: EXPLORATORY LAPAROTOMY;  Surgeon: Manus Rudd, MD;  Location: [facility];  Service: General;  Laterality: N/A;  . PROSTATECTOMY    . WOUND DEBRIDEMENT N/A 01/11/2015   Procedure: DEBRIDEMENT WOUND AND VAC DRESSING CHANGE;  Surgeon: Manus Rudd, MD;  [facility];  Service: General;  Laterality: N/A;    Family History  Problem Relation Age of Onset  . Hypertension Mother   . Hypertension Father     Allergies  Allergen Reactions  . Lyrica [Pregabalin] Anaphylaxis  . Morphine And Related Anaphylaxis and Shortness Of Breath  . Montelukast Other (See Comments)    unknown  . Neurontin [Gabapentin] Other (See Comments)    delusional  . Prednisone   . Wellbutrin [Bupropion] Palpitations    Insomnia    Current Outpatient Prescriptions on File Prior to Visit  Medication Sig Dispense Refill  . amLODipine (NORVASC) 10 MG  tablet Take 1 tablet (10 mg total) by mouth daily. 90 tablet 1  . aspirin 81 MG tablet Take 81 mg by mouth daily.    . carvedilol (COREG) 6.25 MG tablet TAKE 1 TABLET BY MOUTH TWICE DAILY WITH A MEAL 180 tablet 1  . Cholecalciferol (VITAMIN D-3) 1000 UNITS CAPS Take 2,000 Units by mouth daily.    . citalopram (CELEXA) 40 MG tablet TAKE 1 TABLET BY MOUTH EVERY DAY FOR MOOD 90 tablet 1  . famotidine (PEPCID) 20 MG tablet TAKE 1 TABLET (20 MG TOTAL) BY MOUTH 2 (TWO) TIMES DAILY. 180 tablet 1  . fluticasone (FLONASE) 50 MCG/ACT nasal spray PLACE 1 SPRAY IN EACH NOSTRIL EVERYDAY 16 g 2  . hydrALAZINE (APRESOLINE) 50 MG tablet Take 1 tablet (50 mg total) by mouth 2 (two) times daily. (Patient taking differently: Take 50 mg by mouth 3 (three) times daily. ) 180 tablet 1  . methocarbamol (ROBAXIN) 500 MG tablet TAKE 1 TABLET BY MOUTH 4 TIMES DAILY 120 tablet 1  . Multiple Vitamin (MULTIVITAMIN WITH MINERALS) TABS tablet Take 1 tablet by mouth daily.    . Omega-3 Fatty Acids (FISH OIL BURP-LESS PO) Take 1 tablet by mouth daily.      . ondansetron (ZOFRAN-ODT) 4 MG disintegrating tablet DISSOLVE 1 TABLET BY MOUTH EVERY 8 HOURS AS NEEDED FOR NAUSEA OR VOMITING. 20 tablet 5  . LORazepam (ATIVAN) 1 MG tablet Take 1 tablet (1 mg total) by mouth every 8 (eight) hours as needed. for anxiety (Patient not taking: Reported on 06/14/2016) 90 tablet 1  . oxycodone (ROXICODONE) 30 MG immediate release tablet Take 30 mg by mouth daily.      No current facility-administered medications on file prior to visit.     BP 140/80 (BP Location: Left Arm, Patient Position: Sitting, Cuff Size: Large)   Pulse (!) 58   Ht 6\' 4"  (1.93 m)   Wt 243 lb (110.2 kg)   SpO2 98%   BMI 29.58 kg/m    Objective:   Physical Exam  Constitutional: He is oriented to person, place, and time. He appears well-developed and well-nourished. No distress.  HENT:  Head: Normocephalic and atraumatic.  Cardiovascular: Normal rate and regular  rhythm.   No murmur heard. Pulmonary/Chest: Effort normal and breath sounds normal. No respiratory distress. He has no wheezes. He has no rales.  Musculoskeletal: He exhibits no edema.  Neurological: He is alert and oriented to person, place, and time.  Skin: Skin is warm and dry.  Psychiatric: Judgment normal. His affect is angry. He is agitated. Cognition and memory are normal.  Billigerent affect  Assessment & Plan:  Case was reviewed with Dr. Abner Greenspan.

## 2016-06-15 NOTE — Assessment & Plan Note (Signed)
Patient has been dismissed by his pain clinic and tells me he is weaning off of his narcotics.

## 2016-06-15 NOTE — Assessment & Plan Note (Signed)
Stable on current medications. Continue same. 

## 2016-06-15 NOTE — Assessment & Plan Note (Signed)
Anxiety is uncontrolled. A controlled substance contract is signed today, UDS ordered and a 30 day supply of lorazepam is provided.  Of note, patient was very billigerent during my visit as well as during the visit with the RN to the point that RN was brought to tears.

## 2016-06-15 NOTE — Assessment & Plan Note (Signed)
I advised pt to follow back up with cardiology but he declined.  "I was told I didn't need to go back and if I do go and  They don't find anything I am going to be mad."

## 2016-06-16 ENCOUNTER — Telehealth: Payer: Self-pay | Admitting: Family

## 2016-06-16 DIAGNOSIS — G894 Chronic pain syndrome: Secondary | ICD-10-CM

## 2016-06-16 NOTE — Telephone Encounter (Signed)
Spouse - Porfirio Mylar [phone]    Called in because she said that pt was seen by provider and they discussed him receiving a list of pain management locations. She called in inquiring about the list.   Please assist pt further.

## 2016-06-17 ENCOUNTER — Encounter: Payer: Self-pay | Admitting: Family

## 2016-06-17 NOTE — Telephone Encounter (Signed)
Notified pt and he requests to pick up list. Printed phone note and placed at front desk for pt to pick up.

## 2016-06-17 NOTE — Telephone Encounter (Signed)
Here are some local pain management groups:    [facility]- [phone]   [facility] [phone]   [facility] [phone]

## 2016-06-22 ENCOUNTER — Telehealth: Payer: Self-pay | Admitting: Family

## 2016-06-22 NOTE — Telephone Encounter (Signed)
Patient dismissed from [facility] by Sandford Craze NP , effective January 26, 2018Dismissal letter sent out by certified / registered mail.  DAJ

## 2016-06-22 NOTE — Progress Notes (Signed)
Annual wellness visit is reviewed and agree.

## 2016-06-23 MED FILL — ONDANSETRON ODT 4 MG TABLET: 4 | 6 days supply | Qty: 20 | Fill #3

## 2016-06-23 NOTE — Telephone Encounter (Addendum)
Caller name:Colasanti,Carmen S Relation to pt: spouse  Call back number: [phone]    Reason for call:  Spouse call stating specialist is requesting a referral and please send to "[facility]" fax # [phone]

## 2016-06-24 NOTE — Telephone Encounter (Signed)
Candise Bowens-- please fax referral / records to below group. Thank you!

## 2016-06-28 ENCOUNTER — Emergency Department (HOSPITAL_BASED_OUTPATIENT_CLINIC_OR_DEPARTMENT_OTHER)
Admission: EM | Admit: 2016-06-28 | Discharge: 2016-06-28 | Disposition: A | Discharge status: Home / Self Care | Payer: Medicare Other | Attending: Emergency Medicine | Admitting: Emergency Medicine

## 2016-06-28 ENCOUNTER — Encounter (HOSPITAL_BASED_OUTPATIENT_CLINIC_OR_DEPARTMENT_OTHER): Payer: Self-pay | Admitting: Emergency Medicine

## 2016-06-28 DIAGNOSIS — Z87891 Personal history of nicotine dependence: Secondary | ICD-10-CM | POA: Diagnosis not present

## 2016-06-28 DIAGNOSIS — F1123 Opioid dependence with withdrawal: Secondary | ICD-10-CM | POA: Insufficient documentation

## 2016-06-28 DIAGNOSIS — F1193 Opioid use, unspecified with withdrawal: Secondary | ICD-10-CM

## 2016-06-28 DIAGNOSIS — I10 Essential (primary) hypertension: Secondary | ICD-10-CM | POA: Diagnosis not present

## 2016-06-28 DIAGNOSIS — Z7982 Long term (current) use of aspirin: Secondary | ICD-10-CM | POA: Insufficient documentation

## 2016-06-28 DIAGNOSIS — Z79899 Other long term (current) drug therapy: Secondary | ICD-10-CM | POA: Insufficient documentation

## 2016-06-28 MED ORDER — CLONIDINE HCL 0.1 MG PO TABS
0.1000 mg | ORAL_TABLET | Freq: Once | ORAL | Status: AC
  Administered 2016-06-28: 0.1 mg via ORAL
  Filled 2016-06-28: qty 1

## 2016-06-28 NOTE — ED Notes (Signed)
Pt given water per request

## 2016-06-28 NOTE — Telephone Encounter (Signed)
Referral has been faxed to [facility], awaiting appointment

## 2016-06-28 NOTE — Addendum Note (Signed)
Addended by: Mervin Kung A on: 06/28/2016 02:32 PM   Modules accepted: Orders

## 2016-06-28 NOTE — ED Notes (Signed)
Pt ambulatory to bathroom without difficulty.  

## 2016-06-28 NOTE — ED Provider Notes (Signed)
[facility] Provider Note: Lowella Dell, MD, FACEP  CSN: [id number] MRN: [id number] ARRIVAL: 06/28/16 at 0502 ROOM: MH03/MH03   CHIEF COMPLAINT  Withdrawal   HISTORY OF PRESENT ILLNESS  Jared [name] is a 61 y.o. male with a long-standing history of oxycodone and lorazepam use. He was recently dismissed by his pain management clinic, [facility]. Their documentation of this dismissal differs from the account given by the patient.  The patient was also recently seen by his PCP, Dr. Peggyann Juba, who has provided referrals to alternative management clinics.  The patient is here complaining of withdrawal symptoms. Specifically he states he feels agitated, cannot sleep despite taking lorazepam at night, and has been pacing the floor. He denies vomiting or diarrhea. He has been seen by staff ambulating without difficulty.   Past Medical History:  Diagnosis Date  . Anxiety   . BPH (benign prostatic hypertrophy)   . Chest pain   . Chronic back pain   . Chronic prescription opiate use   . Depression   . DJD (degenerative joint disease)   . HTN (hypertension)   . Hypercholesteremia   . Hyperlipemia   . Obesity   . Opiate dependence, continuous (HCC)   . OSA (obstructive sleep apnea)   . Seizures (HCC)     Past Surgical History:  Procedure Laterality Date  . APPLICATION OF WOUND VAC N/A 01/08/2015   Procedure: APPLICATION OF WOUND VAC;  Surgeon: Manus Rudd, MD;  Location: [facility] OR;  Service: General;  Laterality: N/A;  . BACK SURGERY     Lspine w/Hardware  . CARDIAC CATHETERIZATION N/A 01/13/2015   Procedure: Left Heart Cath and Coronary Angiography;  Surgeon: Corky Crafts, MD;  Location: [facility] INVASIVE CV LAB;  Service: Cardiovascular;  Laterality: N/A;  . CERVICAL SPINE SURGERY     w/Hardware  . LAPAROTOMY N/A 01/08/2015   Procedure: EXPLORATORY LAPAROTOMY;  Surgeon: Manus Rudd, MD;  Location: [facility] OR;  Service: General;  Laterality: N/A;  .  PROSTATECTOMY    . WOUND DEBRIDEMENT N/A 01/11/2015   Procedure: DEBRIDEMENT WOUND AND VAC DRESSING CHANGE;  Surgeon: Manus Rudd, MD;  Location: [facility] OR;  Service: General;  Laterality: N/A;    Family History  Problem Relation Age of Onset  . Hypertension Mother   . Hypertension Father     Social History  Substance Use Topics  . Smoking status: Former Games developer  . Smokeless tobacco: Never Used  . Alcohol use No    Prior to Admission medications   Medication Sig Start Date End Date Taking? Authorizing Provider  amLODipine (NORVASC) 10 MG tablet Take 1 tablet (10 mg total) by mouth daily. 11/11/15   Waldon Merl, PA-C  aspirin 81 MG tablet Take 81 mg by mouth daily.    Historical Provider, MD  carvedilol (COREG) 6.25 MG tablet TAKE 1 TABLET BY MOUTH TWICE DAILY WITH A MEAL 11/11/15   Waldon Merl, PA-C  Cholecalciferol (VITAMIN D-3) 1000 UNITS CAPS Take 2,000 Units by mouth daily.    Historical Provider, MD  citalopram (CELEXA) 40 MG tablet TAKE 1 TABLET BY MOUTH EVERY DAY FOR MOOD 11/11/15   Waldon Merl, PA-C  famotidine (PEPCID) 20 MG tablet TAKE 1 TABLET (20 MG TOTAL) BY MOUTH 2 (TWO) TIMES DAILY. 05/11/16   Sharlene Dory, DO  fluticasone [facility]) 50 MCG/ACT nasal spray PLACE 1 SPRAY IN [facility] NOSTRIL EVERYDAY 03/10/16   Waldon Merl, PA-C  hydrALAZINE (APRESOLINE) 50 MG tablet Take 1 tablet (50  mg total) by mouth 2 (two) times daily. Patient taking differently: Take 50 mg by mouth 3 (three) times daily.  11/11/15   Waldon Merl, PA-C  LORazepam (ATIVAN) 1 MG tablet Take 1 tablet (1 mg total) by mouth every 8 (eight) hours as needed. for anxiety 06/14/16   Sandford Craze, NP  methocarbamol (ROBAXIN) 500 MG tablet TAKE 1 TABLET BY MOUTH 4 TIMES DAILY 05/04/16   Sharlene Dory, DO  Multiple Vitamin (MULTIVITAMIN WITH MINERALS) TABS tablet Take 1 tablet by mouth daily. 02/11/15   Bernadene Person, NP  Omega-3 Fatty Acids (FISH OIL BURP-LESS PO) Take 1  tablet by mouth daily.      Historical Provider, MD  ondansetron (ZOFRAN-ODT) 4 MG disintegrating tablet DISSOLVE 1 TABLET BY MOUTH EVERY 8 HOURS AS NEEDED FOR NAUSEA OR VOMITING. 03/30/16   Waldon Merl, PA-C  oxycodone (ROXICODONE) 30 MG immediate release tablet Take 30 mg by mouth daily.     Historical Provider, MD    Allergies Lyrica [pregabalin]; Morphine and related; Montelukast; Neurontin [gabapentin]; Prednisone; and Wellbutrin [bupropion]   REVIEW OF SYSTEMS  Negative except as noted here or in the History of Present Illness.   PHYSICAL EXAMINATION  Initial Vital Signs Blood pressure 137/76, pulse 64, temperature 97.8 F (36.6 C), temperature source Oral, resp. rate 22, height 6\' 4"  (1.93 m), weight 243 lb (110.2 kg), SpO2 98 %.  Examination General: Well-developed, well-nourished male in no acute distress; appearance consistent with age of record HENT: normocephalic; atraumatic Eyes: pupils equal, round and reactive to light; extraocular muscles intact Neck: supple Heart: regular rate and rhythm Lungs: clear to auscultation bilaterally Abdomen: soft; nondistended; nontender; bowel sounds present Extremities: No deformity; full range of motion; pulses normal Neurologic: Awake, alert and oriented; motor function intact in all extremities and symmetric; no facial droop; no tremor noted; normal gait; normal coordination; normal speech Skin: Warm and dry Psychiatric: Normal mood and affect   RESULTS  Summary of this visit's results, reviewed by myself:   EKG Interpretation  Date/Time:    Ventricular Rate:    PR Interval:    QRS Duration:   QT Interval:    QTC Calculation:   R Axis:     Text Interpretation:        Laboratory Studies: No results found for this or any previous visit (from the past 24 hour(s)). Imaging Studies: No results found.  ED COURSE  Nursing notes and initial vitals signs, including pulse oximetry, reviewed.  Vitals:   06/28/16 0509   BP: 137/76  Pulse: 64  Resp: 22  Temp: 97.8 F (36.6 C)  TempSrc: Oral  SpO2: 98%  Weight: 243 lb (110.2 kg)  Height: 6\' 4"  (1.93 m)   6:54 AM A review of the patient's chart reveals that he exhibited threatening behavior to multiple providers including an ED physician, a primary care physician, his pain management clinic and a nurse in his PCPs office. The patient was not confronted about this, nor did he become confrontational with me (our security guard was outside the room but within view). He was calmly advised that the ED is limited in its ability to treat chronic pain or to treat narcotic withdrawal. We will provide a prescription for clonidine taper and he is amenable to this. He was advised that Dr. Arvil Chaco office has forwarded list of pain management clinics as he requested.  PROCEDURES    ED DIAGNOSES     ICD-9-CM ICD-10-CM   1. 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

## 2016-06-28 NOTE — Telephone Encounter (Signed)
Patient's wife called stating that the patient was seen in the ED due to pain medication withdraws. She states that she received the letter that the patient has been dismissed from the practice but the ED doctor stated that [facility] sent a referral to pain management for him. She would like more clarification on what is going on with the pain management referral and the practice dismissal. Please advise  Phone: [phone]

## 2016-06-28 NOTE — ED Triage Notes (Addendum)
Pt states he is having withdrawal symptoms including in ability to sleep and pacing the floor. Pt states his pain management doctor "went out of business." per care everywhere pt was dismissed from [facility] [employer] and was given a list of pain management doctors to choose from and a referral was faxed last week. Pt is laying in bed, relaxed. No visible tremors, diaphoresis or vomiting noted.

## 2016-06-28 NOTE — Telephone Encounter (Signed)
Spoke with pt's spouse and advised her that pain management referral did not go through on 06/21/16 and was refaxed today. She has no questions at this time about dismissal letter and states they will have to find a new Provider but she can only deal with one thing at a time. They are "dealing with his withdrawals and are waiting on pain management referral". She will call [facility] tomorrow to try and expedite an appointment.

## 2016-06-29 NOTE — Telephone Encounter (Signed)
Received signed domestic return receipt verifying delivery of certified letter on June 25, 2016. Article number [id number]

## 2016-08-16 DIAGNOSIS — F3341 Major depressive disorder, recurrent, in partial remission: Secondary | ICD-10-CM | POA: Diagnosis not present

## 2016-08-16 DIAGNOSIS — Z1159 Encounter for screening for other viral diseases: Secondary | ICD-10-CM | POA: Diagnosis not present

## 2016-08-16 DIAGNOSIS — F419 Anxiety disorder, unspecified: Secondary | ICD-10-CM | POA: Diagnosis not present

## 2016-08-16 DIAGNOSIS — R110 Nausea: Secondary | ICD-10-CM | POA: Diagnosis not present

## 2016-08-16 DIAGNOSIS — I10 Essential (primary) hypertension: Secondary | ICD-10-CM | POA: Diagnosis not present

## 2016-08-16 DIAGNOSIS — J301 Allergic rhinitis due to pollen: Secondary | ICD-10-CM | POA: Diagnosis not present

## 2016-09-14 ENCOUNTER — Ambulatory Visit: Payer: Medicare Other | Admitting: Family

## 2016-10-12 DIAGNOSIS — Z888 Allergy status to other drugs, medicaments and biological substances status: Secondary | ICD-10-CM | POA: Diagnosis not present

## 2016-10-12 DIAGNOSIS — R51 Headache: Secondary | ICD-10-CM | POA: Diagnosis not present

## 2016-10-12 DIAGNOSIS — I10 Essential (primary) hypertension: Secondary | ICD-10-CM | POA: Diagnosis not present

## 2016-10-12 DIAGNOSIS — R064 Hyperventilation: Secondary | ICD-10-CM | POA: Diagnosis not present

## 2016-10-12 DIAGNOSIS — Z981 Arthrodesis status: Secondary | ICD-10-CM | POA: Diagnosis not present

## 2016-10-12 DIAGNOSIS — Z885 Allergy status to narcotic agent status: Secondary | ICD-10-CM | POA: Diagnosis not present

## 2016-10-12 DIAGNOSIS — R079 Chest pain, unspecified: Secondary | ICD-10-CM | POA: Diagnosis not present

## 2016-10-12 DIAGNOSIS — F458 Other somatoform disorders: Secondary | ICD-10-CM | POA: Diagnosis not present

## 2016-10-12 DIAGNOSIS — F419 Anxiety disorder, unspecified: Secondary | ICD-10-CM | POA: Diagnosis not present

## 2016-10-12 DIAGNOSIS — I252 Old myocardial infarction: Secondary | ICD-10-CM | POA: Diagnosis not present

## 2016-10-12 DIAGNOSIS — G40909 Epilepsy, unspecified, not intractable, without status epilepticus: Secondary | ICD-10-CM | POA: Diagnosis not present

## 2016-10-12 DIAGNOSIS — Z79899 Other long term (current) drug therapy: Secondary | ICD-10-CM | POA: Diagnosis not present

## 2016-10-19 DIAGNOSIS — I10 Essential (primary) hypertension: Secondary | ICD-10-CM | POA: Diagnosis not present

## 2016-10-19 DIAGNOSIS — J0100 Acute maxillary sinusitis, unspecified: Secondary | ICD-10-CM | POA: Diagnosis not present

## 2016-10-19 DIAGNOSIS — F411 Generalized anxiety disorder: Secondary | ICD-10-CM | POA: Diagnosis not present

## 2016-10-19 DIAGNOSIS — M545 Low back pain: Secondary | ICD-10-CM | POA: Diagnosis not present

## 2016-10-19 DIAGNOSIS — R110 Nausea: Secondary | ICD-10-CM | POA: Diagnosis not present

## 2016-10-19 DIAGNOSIS — G8929 Other chronic pain: Secondary | ICD-10-CM | POA: Diagnosis not present

## 2016-10-27 DIAGNOSIS — F4001 Agoraphobia with panic disorder: Secondary | ICD-10-CM | POA: Diagnosis not present

## 2016-10-27 DIAGNOSIS — Z5181 Encounter for therapeutic drug level monitoring: Secondary | ICD-10-CM | POA: Diagnosis not present

## 2016-10-27 DIAGNOSIS — R7989 Other specified abnormal findings of blood chemistry: Secondary | ICD-10-CM | POA: Diagnosis not present

## 2016-10-27 DIAGNOSIS — F4323 Adjustment disorder with mixed anxiety and depressed mood: Secondary | ICD-10-CM | POA: Diagnosis not present

## 2016-10-27 DIAGNOSIS — F319 Bipolar disorder, unspecified: Secondary | ICD-10-CM | POA: Diagnosis not present

## 2016-11-22 DIAGNOSIS — F319 Bipolar disorder, unspecified: Secondary | ICD-10-CM | POA: Diagnosis not present

## 2016-11-22 DIAGNOSIS — F4001 Agoraphobia with panic disorder: Secondary | ICD-10-CM | POA: Diagnosis not present

## 2016-11-22 DIAGNOSIS — F411 Generalized anxiety disorder: Secondary | ICD-10-CM | POA: Diagnosis not present

## 2016-11-22 DIAGNOSIS — I10 Essential (primary) hypertension: Secondary | ICD-10-CM | POA: Diagnosis not present

## 2016-12-21 DIAGNOSIS — I10 Essential (primary) hypertension: Secondary | ICD-10-CM | POA: Diagnosis not present

## 2016-12-21 DIAGNOSIS — F3111 Bipolar disorder, current episode manic without psychotic features, mild: Secondary | ICD-10-CM | POA: Diagnosis not present

## 2016-12-21 DIAGNOSIS — F411 Generalized anxiety disorder: Secondary | ICD-10-CM | POA: Diagnosis not present

## 2016-12-21 DIAGNOSIS — F4001 Agoraphobia with panic disorder: Secondary | ICD-10-CM | POA: Diagnosis not present

## 2016-12-26 DIAGNOSIS — M5441 Lumbago with sciatica, right side: Secondary | ICD-10-CM | POA: Diagnosis not present

## 2016-12-26 DIAGNOSIS — T148XXA Other injury of unspecified body region, initial encounter: Secondary | ICD-10-CM | POA: Diagnosis not present

## 2016-12-26 DIAGNOSIS — G8929 Other chronic pain: Secondary | ICD-10-CM | POA: Diagnosis not present

## 2016-12-26 DIAGNOSIS — I10 Essential (primary) hypertension: Secondary | ICD-10-CM | POA: Diagnosis not present

## 2017-01-16 DIAGNOSIS — M5417 Radiculopathy, lumbosacral region: Secondary | ICD-10-CM | POA: Diagnosis not present

## 2017-01-16 DIAGNOSIS — G8929 Other chronic pain: Secondary | ICD-10-CM | POA: Diagnosis not present

## 2017-01-16 DIAGNOSIS — Z5181 Encounter for therapeutic drug level monitoring: Secondary | ICD-10-CM | POA: Diagnosis not present

## 2017-01-16 DIAGNOSIS — Z79899 Other long term (current) drug therapy: Secondary | ICD-10-CM | POA: Diagnosis not present

## 2017-01-16 DIAGNOSIS — M545 Low back pain: Secondary | ICD-10-CM | POA: Diagnosis not present

## 2017-01-16 DIAGNOSIS — I10 Essential (primary) hypertension: Secondary | ICD-10-CM | POA: Diagnosis not present

## 2017-01-16 DIAGNOSIS — M79605 Pain in left leg: Secondary | ICD-10-CM | POA: Diagnosis not present

## 2017-01-18 DIAGNOSIS — G894 Chronic pain syndrome: Secondary | ICD-10-CM | POA: Diagnosis not present

## 2017-01-18 DIAGNOSIS — Z885 Allergy status to narcotic agent status: Secondary | ICD-10-CM | POA: Diagnosis not present

## 2017-01-18 DIAGNOSIS — Z7951 Long term (current) use of inhaled steroids: Secondary | ICD-10-CM | POA: Diagnosis not present

## 2017-01-18 DIAGNOSIS — Z87891 Personal history of nicotine dependence: Secondary | ICD-10-CM | POA: Diagnosis not present

## 2017-01-18 DIAGNOSIS — F419 Anxiety disorder, unspecified: Secondary | ICD-10-CM | POA: Diagnosis not present

## 2017-01-18 DIAGNOSIS — M5417 Radiculopathy, lumbosacral region: Secondary | ICD-10-CM | POA: Diagnosis not present

## 2017-01-18 DIAGNOSIS — Z888 Allergy status to other drugs, medicaments and biological substances status: Secondary | ICD-10-CM | POA: Diagnosis not present

## 2017-01-18 DIAGNOSIS — I10 Essential (primary) hypertension: Secondary | ICD-10-CM | POA: Diagnosis not present

## 2017-01-18 DIAGNOSIS — Z79899 Other long term (current) drug therapy: Secondary | ICD-10-CM | POA: Diagnosis not present

## 2017-01-18 DIAGNOSIS — F4542 Pain disorder with related psychological factors: Secondary | ICD-10-CM | POA: Diagnosis not present

## 2017-01-19 DIAGNOSIS — F4001 Agoraphobia with panic disorder: Secondary | ICD-10-CM | POA: Diagnosis not present

## 2017-01-19 DIAGNOSIS — F4323 Adjustment disorder with mixed anxiety and depressed mood: Secondary | ICD-10-CM | POA: Diagnosis not present

## 2017-01-19 DIAGNOSIS — I10 Essential (primary) hypertension: Secondary | ICD-10-CM | POA: Diagnosis not present

## 2017-01-19 DIAGNOSIS — F310 Bipolar disorder, current episode hypomanic: Secondary | ICD-10-CM | POA: Diagnosis not present

## 2017-01-19 DIAGNOSIS — F411 Generalized anxiety disorder: Secondary | ICD-10-CM | POA: Diagnosis not present

## 2017-03-09 DIAGNOSIS — F4001 Agoraphobia with panic disorder: Secondary | ICD-10-CM | POA: Diagnosis not present

## 2017-03-09 DIAGNOSIS — I10 Essential (primary) hypertension: Secondary | ICD-10-CM | POA: Diagnosis not present

## 2017-03-09 DIAGNOSIS — F310 Bipolar disorder, current episode hypomanic: Secondary | ICD-10-CM | POA: Diagnosis not present

## 2017-03-09 DIAGNOSIS — F411 Generalized anxiety disorder: Secondary | ICD-10-CM | POA: Diagnosis not present

## 2017-03-09 DIAGNOSIS — Z79899 Other long term (current) drug therapy: Secondary | ICD-10-CM | POA: Diagnosis not present

## 2017-03-17 DIAGNOSIS — M5441 Lumbago with sciatica, right side: Secondary | ICD-10-CM | POA: Diagnosis not present

## 2017-03-17 DIAGNOSIS — Z23 Encounter for immunization: Secondary | ICD-10-CM | POA: Diagnosis not present

## 2017-03-17 DIAGNOSIS — F4001 Agoraphobia with panic disorder: Secondary | ICD-10-CM | POA: Diagnosis not present

## 2017-03-17 DIAGNOSIS — F411 Generalized anxiety disorder: Secondary | ICD-10-CM | POA: Diagnosis not present

## 2017-03-17 DIAGNOSIS — I10 Essential (primary) hypertension: Secondary | ICD-10-CM | POA: Diagnosis not present

## 2017-03-17 DIAGNOSIS — G8929 Other chronic pain: Secondary | ICD-10-CM | POA: Diagnosis not present

## 2017-03-17 DIAGNOSIS — G894 Chronic pain syndrome: Secondary | ICD-10-CM | POA: Diagnosis not present

## 2017-03-20 DIAGNOSIS — Z79899 Other long term (current) drug therapy: Secondary | ICD-10-CM | POA: Diagnosis not present

## 2017-03-20 DIAGNOSIS — F310 Bipolar disorder, current episode hypomanic: Secondary | ICD-10-CM | POA: Diagnosis not present

## 2017-04-25 DIAGNOSIS — M545 Low back pain: Secondary | ICD-10-CM | POA: Diagnosis not present

## 2017-04-25 DIAGNOSIS — Z888 Allergy status to other drugs, medicaments and biological substances status: Secondary | ICD-10-CM | POA: Diagnosis not present

## 2017-04-25 DIAGNOSIS — I252 Old myocardial infarction: Secondary | ICD-10-CM | POA: Diagnosis not present

## 2017-04-25 DIAGNOSIS — Z7951 Long term (current) use of inhaled steroids: Secondary | ICD-10-CM | POA: Diagnosis not present

## 2017-04-25 DIAGNOSIS — F4001 Agoraphobia with panic disorder: Secondary | ICD-10-CM | POA: Diagnosis not present

## 2017-04-25 DIAGNOSIS — F419 Anxiety disorder, unspecified: Secondary | ICD-10-CM | POA: Diagnosis not present

## 2017-04-25 DIAGNOSIS — I10 Essential (primary) hypertension: Secondary | ICD-10-CM | POA: Diagnosis not present

## 2017-04-25 DIAGNOSIS — Z885 Allergy status to narcotic agent status: Secondary | ICD-10-CM | POA: Diagnosis not present

## 2017-04-25 DIAGNOSIS — F3171 Bipolar disorder, in partial remission, most recent episode hypomanic: Secondary | ICD-10-CM | POA: Diagnosis not present

## 2017-04-25 DIAGNOSIS — Z87891 Personal history of nicotine dependence: Secondary | ICD-10-CM | POA: Diagnosis not present

## 2017-04-25 DIAGNOSIS — F319 Bipolar disorder, unspecified: Secondary | ICD-10-CM | POA: Diagnosis not present

## 2017-04-25 DIAGNOSIS — R569 Unspecified convulsions: Secondary | ICD-10-CM | POA: Diagnosis not present

## 2017-04-25 DIAGNOSIS — F411 Generalized anxiety disorder: Secondary | ICD-10-CM | POA: Diagnosis not present

## 2017-04-25 DIAGNOSIS — G8929 Other chronic pain: Secondary | ICD-10-CM | POA: Diagnosis not present

## 2017-04-25 DIAGNOSIS — Z79899 Other long term (current) drug therapy: Secondary | ICD-10-CM | POA: Diagnosis not present

## 2017-04-25 DIAGNOSIS — F4323 Adjustment disorder with mixed anxiety and depressed mood: Secondary | ICD-10-CM | POA: Diagnosis not present

## 2017-04-25 DIAGNOSIS — E669 Obesity, unspecified: Secondary | ICD-10-CM | POA: Diagnosis not present

## 2017-05-03 DIAGNOSIS — M5417 Radiculopathy, lumbosacral region: Secondary | ICD-10-CM | POA: Diagnosis not present

## 2017-05-03 DIAGNOSIS — I10 Essential (primary) hypertension: Secondary | ICD-10-CM | POA: Diagnosis not present

## 2017-05-03 DIAGNOSIS — M5136 Other intervertebral disc degeneration, lumbar region: Secondary | ICD-10-CM | POA: Diagnosis not present

## 2017-05-03 DIAGNOSIS — G8929 Other chronic pain: Secondary | ICD-10-CM | POA: Diagnosis not present

## 2017-05-03 DIAGNOSIS — M5442 Lumbago with sciatica, left side: Secondary | ICD-10-CM | POA: Diagnosis not present

## 2017-05-28 IMAGING — CR DG ABD PORTABLE 1V
1 series · 1 of 1 positions shown · non-contrast
Comparison: CT abdomen and pelvis April 25, 2012

CLINICAL DATA: Abdominal pain

EXAM:
PORTABLE ABDOMEN - 1 VIEW

[AP]
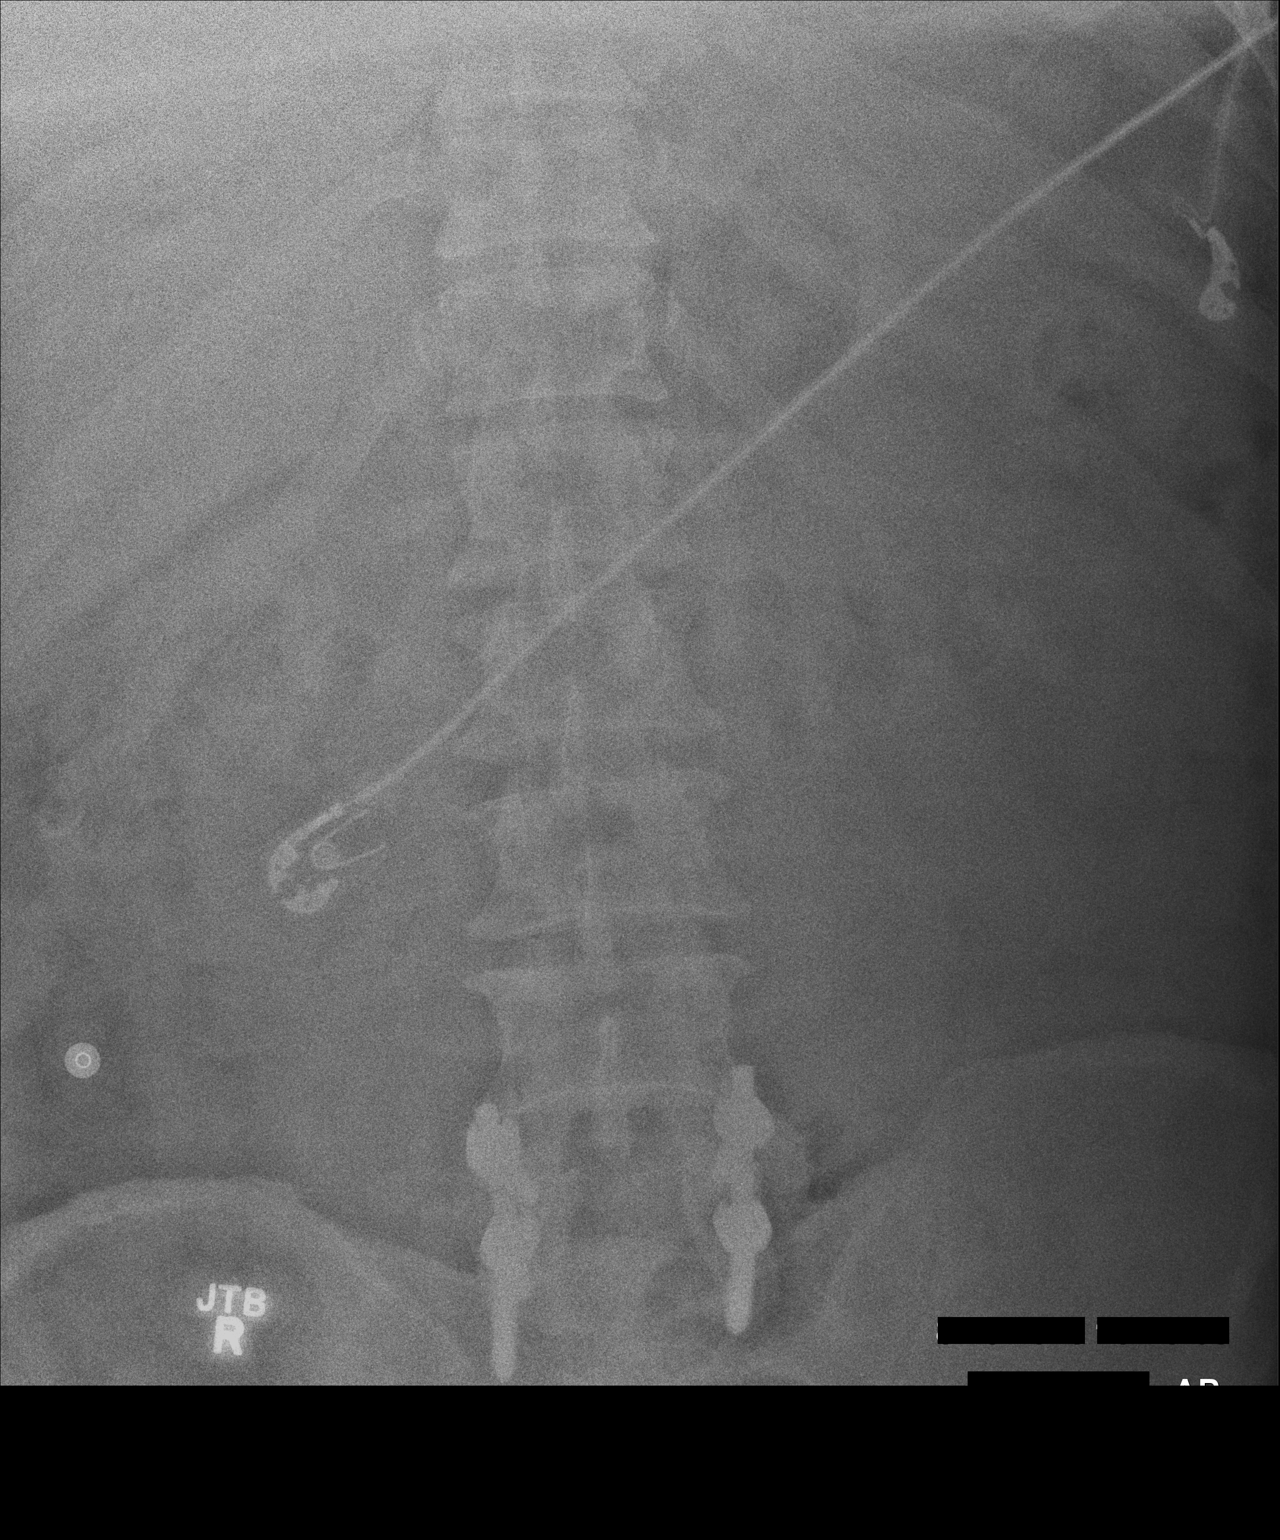

[1 of 1 positions shown; findings below may reference images not displayed]

FINDINGS: There is moderate stool in the colon. No obstruction is appreciable
on this supine examination. No free air is seen on this supine
examination. There is postoperative change at L5 and S1. There is a
right femoral catheter with the tip overlying the mid sacrum.
Catheter tip is likely in the right external iliac vein region.
IMPRESSION: Right frontal region catheter with tip overlying the mid right
sacrum. No obstruction or free air is seen on this supine
examination. Note that evaluation for free air is limited on supine
only examination.

## 2017-05-28 IMAGING — DX DG CHEST 1V PORT
2 series · 2 of 2 positions shown · non-contrast
Comparison: 05/16/2012.

CLINICAL DATA: Confusion and dyspnea since this morning.

EXAM:
PORTABLE CHEST - 1 VIEW

[chest ap (1 of 2)]
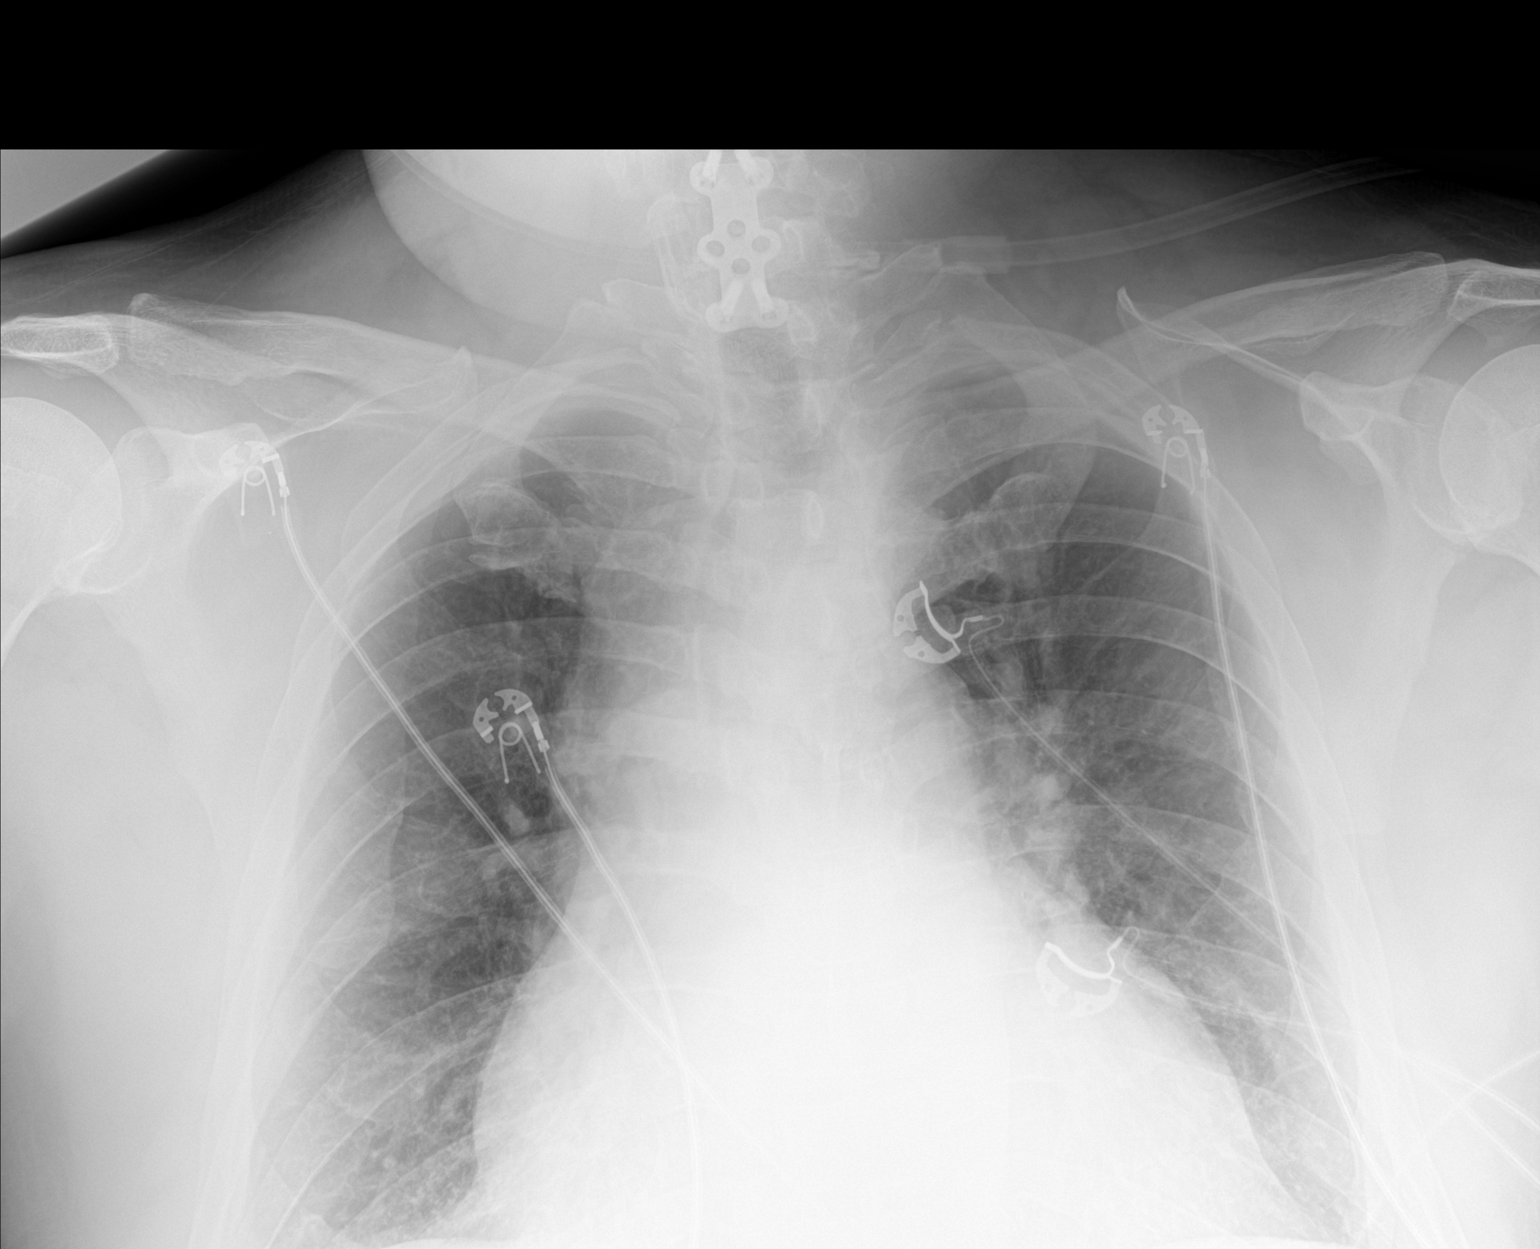

[chest ap (2 of 2)]
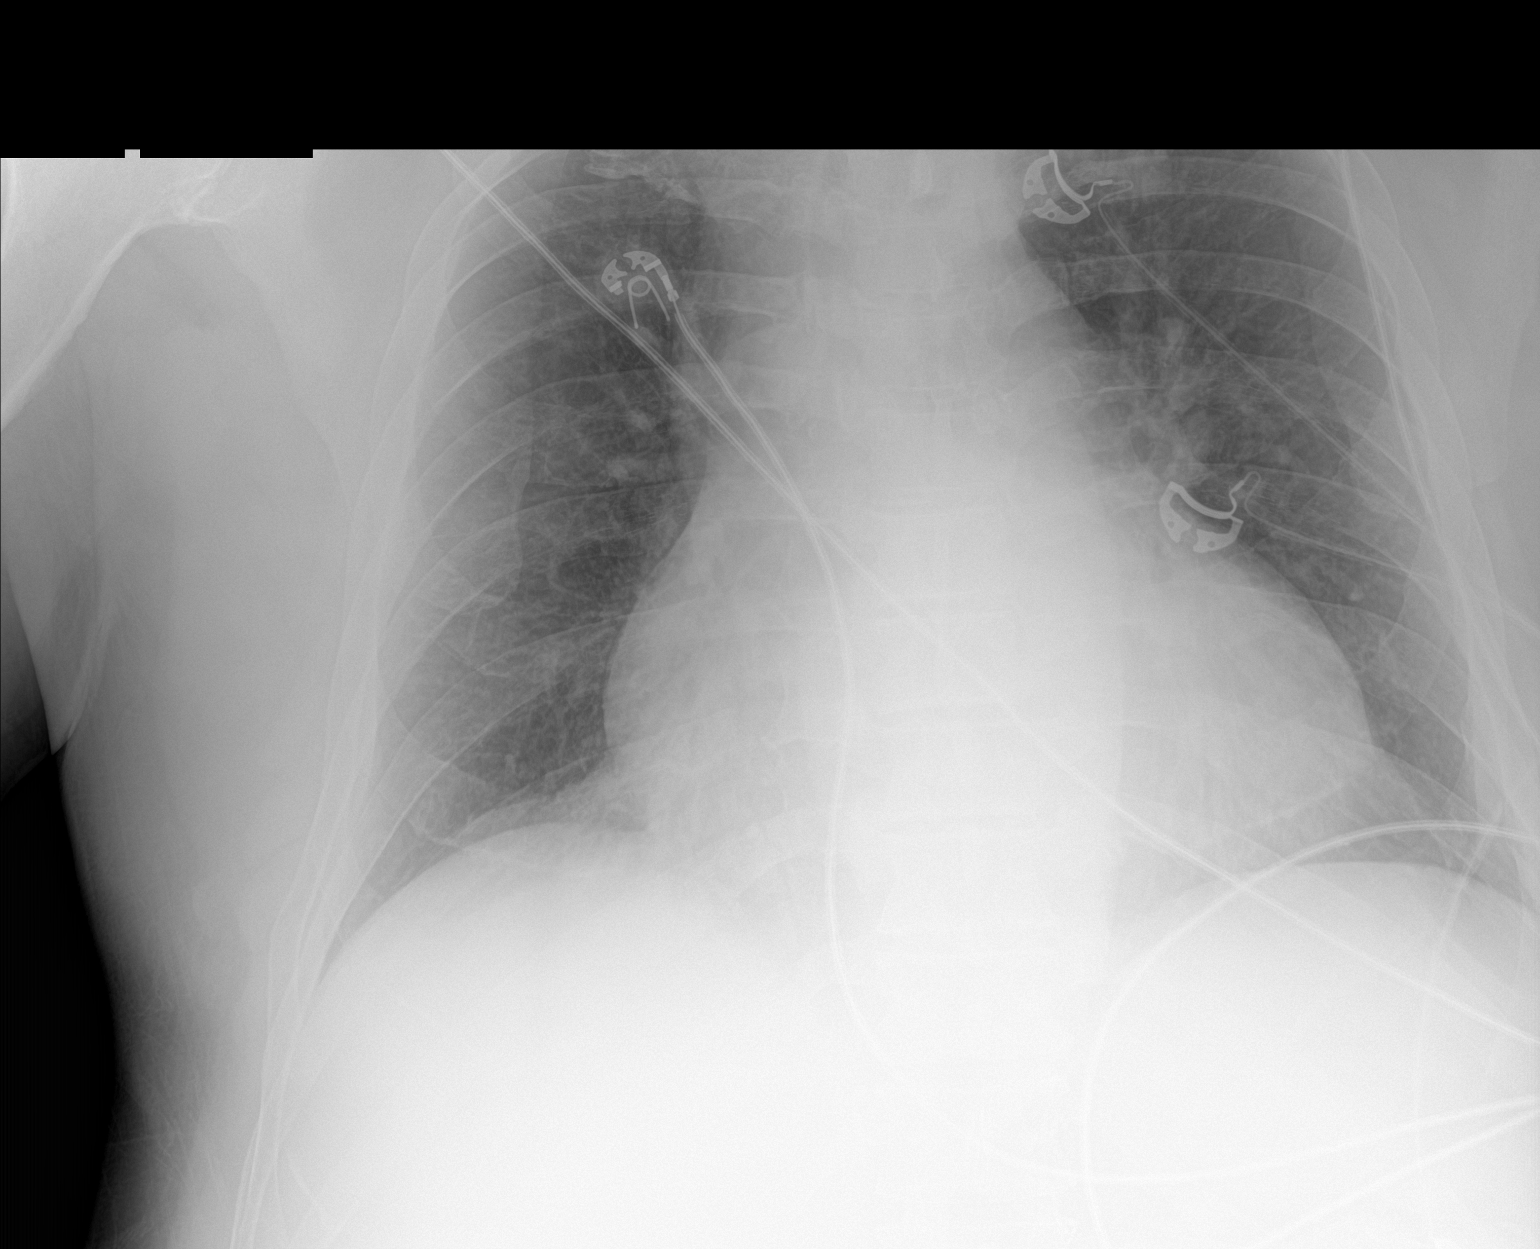

[2 of 2 positions shown; findings below may reference images not displayed]

FINDINGS: The heart is mildly enlarged but stable. The mediastinal and hilar
contours are slightly prominent but unchanged. Mild vascular
congestion but no edema, infiltrates or effusions. The bony thorax
is intact.
IMPRESSION: Stable cardiac enlargement and mild vascular congestion. No
pulmonary edema or pleural effusions.

## 2017-05-28 IMAGING — CR DG CHEST 1V PORT
1 series · 1 of 1 positions shown · non-contrast
Comparison: Earlier film, same date.

CLINICAL DATA: Intubation.

EXAM:
PORTABLE CHEST - 1 VIEW

[AP]
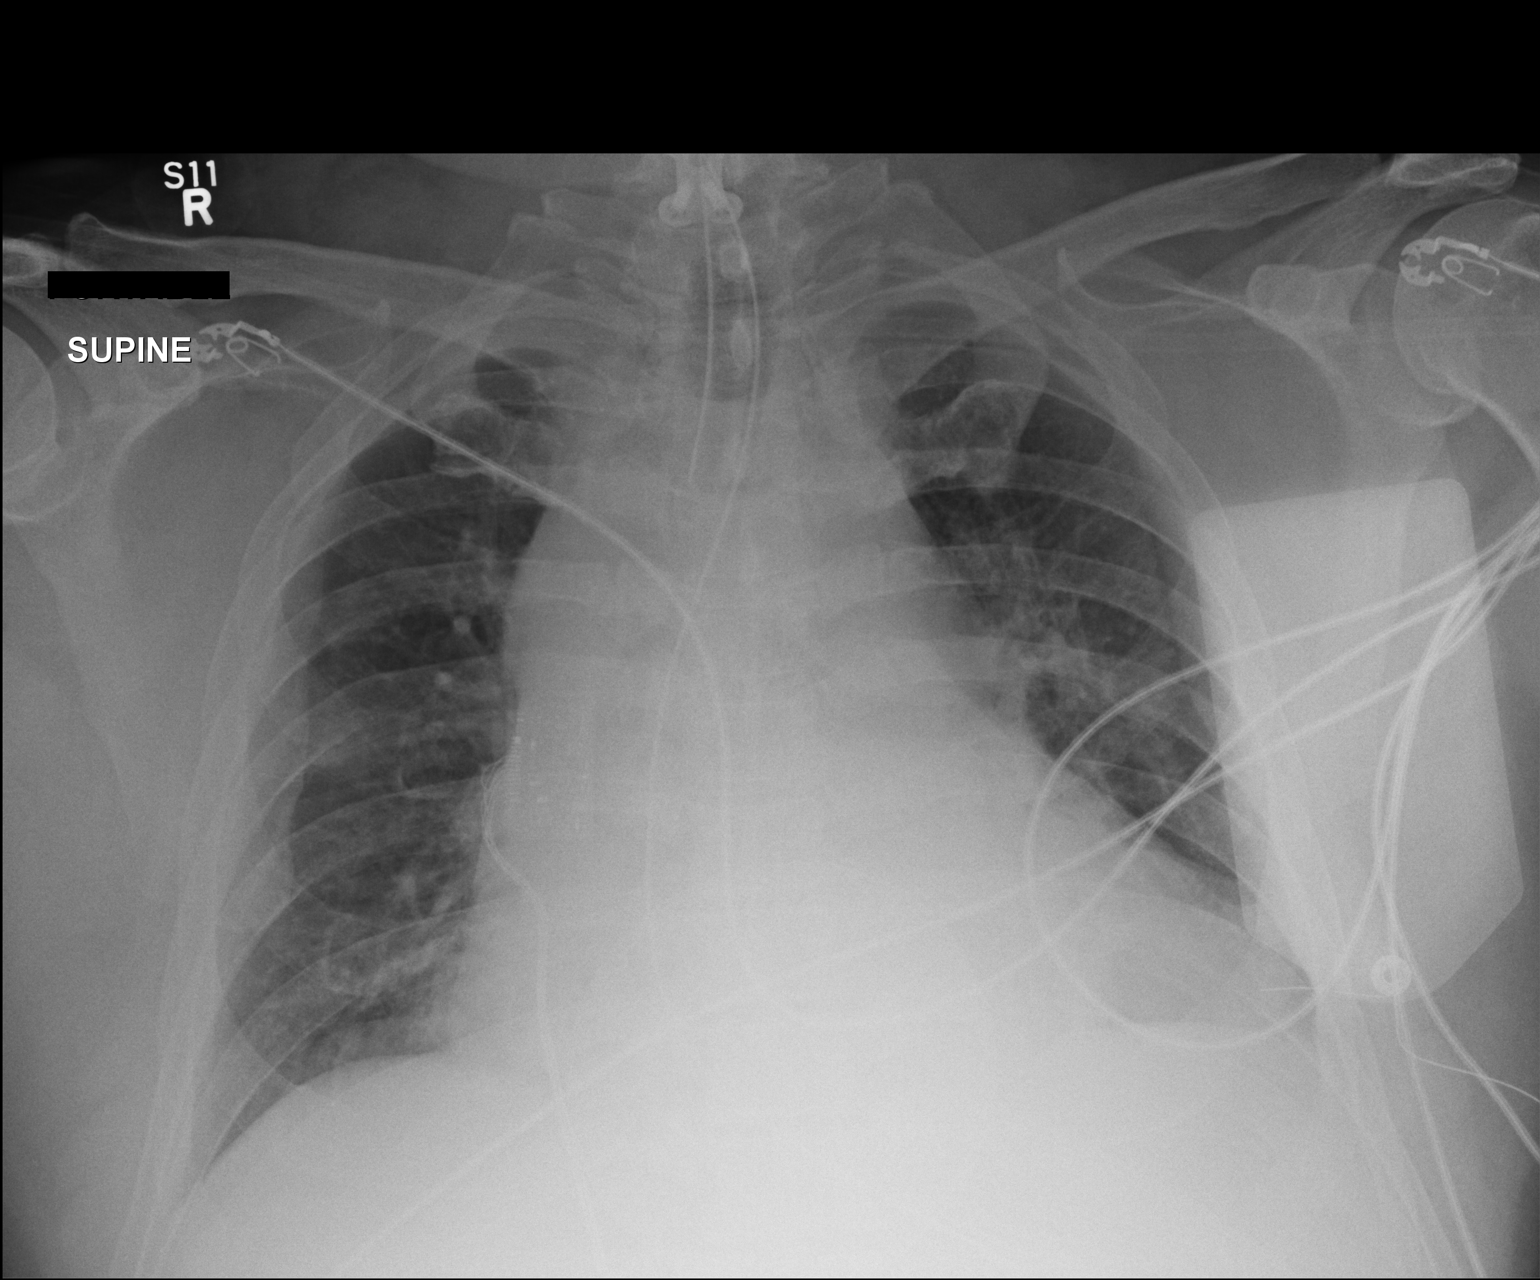

[1 of 1 positions shown; findings below may reference images not displayed]

FINDINGS: The endotracheal to is 4 cm above the carina. The NG tube is
coursing down the esophagus and into the stomach. The heart is
enlarged but stable. Prominent mediastinal and hilar contours are
unchanged. External pacer paddles are noted. The lungs are grossly
clear and stable. Persistent bibasilar atelectasis.
IMPRESSION: Support apparatus in good position without complicating features.

Stable cardiac enlargement. No significant pulmonary findings.
Persistent bibasilar atelectasis.

## 2017-05-29 IMAGING — CR DG CHEST 1V PORT
1 series · 1 of 1 positions shown · non-contrast
Comparison: 01/08/2015.

CLINICAL DATA: Respiratory failure.

EXAM:
PORTABLE CHEST - 1 VIEW

[AP]
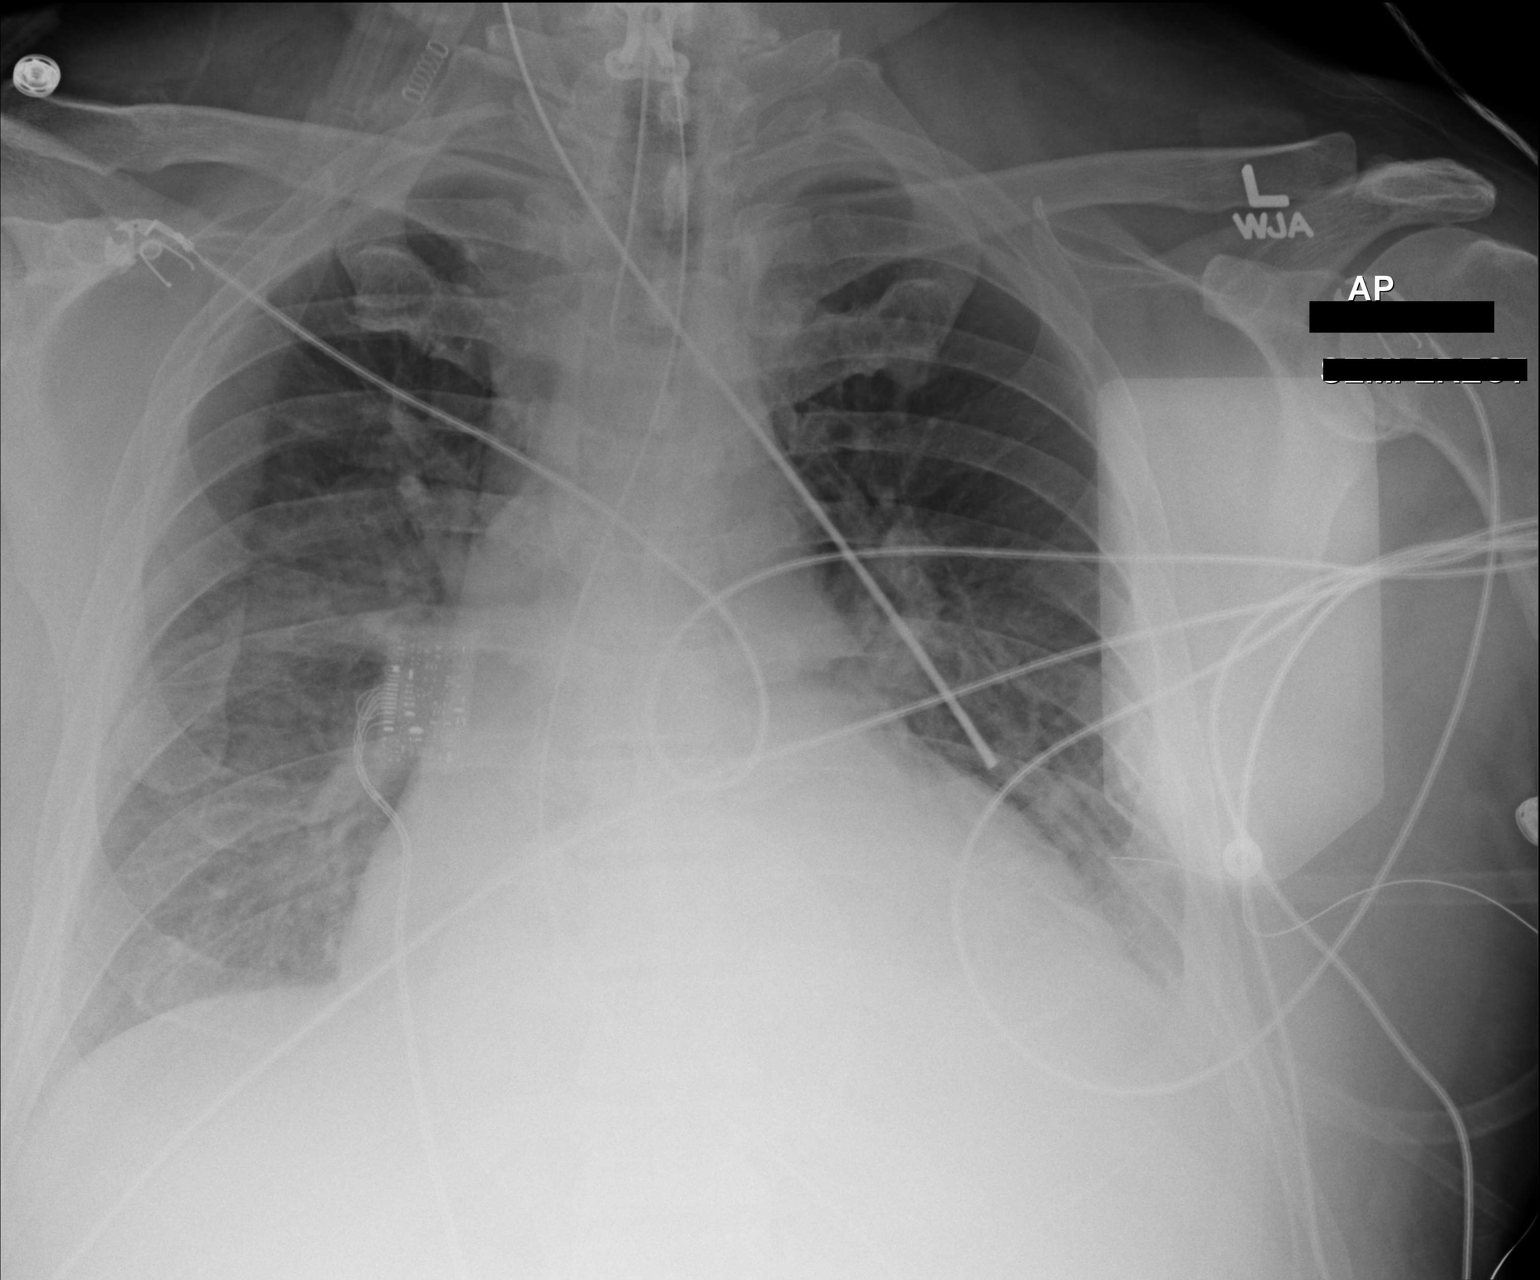

[1 of 1 positions shown; findings below may reference images not displayed]

FINDINGS: Endotracheal tube and NG tube in good anatomic position.
Cardiomegaly with pulmonary vascular prominence and diffuse
interstitial prominence with bilateral effusions noted. Findings
consistent with congestive heart failure. No pneumothorax. Prior
cervical spine fusion.
IMPRESSION: 1. Lines and tubes in stable position.
2. Cardiomegaly with pulmonary venous congestion, bilateral
interstitial prominence, and left-sided pleural effusion consistent
with congestive heart failure.

## 2017-06-08 DIAGNOSIS — Z79899 Other long term (current) drug therapy: Secondary | ICD-10-CM | POA: Diagnosis not present

## 2017-06-08 DIAGNOSIS — M5417 Radiculopathy, lumbosacral region: Secondary | ICD-10-CM | POA: Diagnosis not present

## 2017-06-08 DIAGNOSIS — M79605 Pain in left leg: Secondary | ICD-10-CM | POA: Diagnosis not present

## 2017-06-08 DIAGNOSIS — G894 Chronic pain syndrome: Secondary | ICD-10-CM | POA: Diagnosis not present

## 2017-06-08 DIAGNOSIS — Z5181 Encounter for therapeutic drug level monitoring: Secondary | ICD-10-CM | POA: Diagnosis not present

## 2017-06-08 DIAGNOSIS — I10 Essential (primary) hypertension: Secondary | ICD-10-CM | POA: Diagnosis not present

## 2017-06-08 DIAGNOSIS — M5440 Lumbago with sciatica, unspecified side: Secondary | ICD-10-CM | POA: Diagnosis not present

## 2017-06-26 DIAGNOSIS — M79605 Pain in left leg: Secondary | ICD-10-CM | POA: Diagnosis not present

## 2017-06-26 DIAGNOSIS — G894 Chronic pain syndrome: Secondary | ICD-10-CM | POA: Diagnosis not present

## 2017-06-26 DIAGNOSIS — I10 Essential (primary) hypertension: Secondary | ICD-10-CM | POA: Diagnosis not present

## 2017-06-26 DIAGNOSIS — Z5181 Encounter for therapeutic drug level monitoring: Secondary | ICD-10-CM | POA: Diagnosis not present

## 2017-06-26 DIAGNOSIS — Z79899 Other long term (current) drug therapy: Secondary | ICD-10-CM | POA: Diagnosis not present

## 2017-06-26 DIAGNOSIS — M5440 Lumbago with sciatica, unspecified side: Secondary | ICD-10-CM | POA: Diagnosis not present

## 2017-06-26 DIAGNOSIS — M5417 Radiculopathy, lumbosacral region: Secondary | ICD-10-CM | POA: Diagnosis not present

## 2017-06-27 DIAGNOSIS — F4001 Agoraphobia with panic disorder: Secondary | ICD-10-CM | POA: Diagnosis not present

## 2017-06-27 DIAGNOSIS — F411 Generalized anxiety disorder: Secondary | ICD-10-CM | POA: Diagnosis not present

## 2017-06-27 DIAGNOSIS — F4323 Adjustment disorder with mixed anxiety and depressed mood: Secondary | ICD-10-CM | POA: Diagnosis not present

## 2017-06-27 DIAGNOSIS — F3172 Bipolar disorder, in full remission, most recent episode hypomanic: Secondary | ICD-10-CM | POA: Diagnosis not present

## 2017-06-27 DIAGNOSIS — I10 Essential (primary) hypertension: Secondary | ICD-10-CM | POA: Diagnosis not present

## 2017-06-27 DIAGNOSIS — F3171 Bipolar disorder, in partial remission, most recent episode hypomanic: Secondary | ICD-10-CM | POA: Diagnosis not present

## 2017-07-25 DIAGNOSIS — G894 Chronic pain syndrome: Secondary | ICD-10-CM | POA: Diagnosis not present

## 2017-07-25 DIAGNOSIS — G8929 Other chronic pain: Secondary | ICD-10-CM | POA: Diagnosis not present

## 2017-07-25 DIAGNOSIS — M5417 Radiculopathy, lumbosacral region: Secondary | ICD-10-CM | POA: Diagnosis not present

## 2017-07-25 DIAGNOSIS — I10 Essential (primary) hypertension: Secondary | ICD-10-CM | POA: Diagnosis not present

## 2017-07-25 DIAGNOSIS — M5440 Lumbago with sciatica, unspecified side: Secondary | ICD-10-CM | POA: Diagnosis not present

## 2017-09-27 DIAGNOSIS — F3172 Bipolar disorder, in full remission, most recent episode hypomanic: Secondary | ICD-10-CM | POA: Diagnosis not present

## 2017-09-27 DIAGNOSIS — F411 Generalized anxiety disorder: Secondary | ICD-10-CM | POA: Diagnosis not present

## 2017-09-27 DIAGNOSIS — F4001 Agoraphobia with panic disorder: Secondary | ICD-10-CM | POA: Diagnosis not present

## 2017-10-03 DIAGNOSIS — F3178 Bipolar disorder, in full remission, most recent episode mixed: Secondary | ICD-10-CM | POA: Diagnosis not present

## 2017-10-03 DIAGNOSIS — M5136 Other intervertebral disc degeneration, lumbar region: Secondary | ICD-10-CM | POA: Diagnosis not present

## 2017-10-03 DIAGNOSIS — Z0000 Encounter for general adult medical examination without abnormal findings: Secondary | ICD-10-CM | POA: Diagnosis not present

## 2017-10-03 DIAGNOSIS — I10 Essential (primary) hypertension: Secondary | ICD-10-CM | POA: Diagnosis not present

## 2017-10-17 DIAGNOSIS — M79605 Pain in left leg: Secondary | ICD-10-CM | POA: Diagnosis not present

## 2017-10-17 DIAGNOSIS — M5417 Radiculopathy, lumbosacral region: Secondary | ICD-10-CM | POA: Diagnosis not present

## 2017-10-17 DIAGNOSIS — M5440 Lumbago with sciatica, unspecified side: Secondary | ICD-10-CM | POA: Diagnosis not present

## 2017-10-17 DIAGNOSIS — G894 Chronic pain syndrome: Secondary | ICD-10-CM | POA: Diagnosis not present

## 2017-10-17 DIAGNOSIS — I10 Essential (primary) hypertension: Secondary | ICD-10-CM | POA: Diagnosis not present

## 2018-01-09 DIAGNOSIS — Z5181 Encounter for therapeutic drug level monitoring: Secondary | ICD-10-CM | POA: Diagnosis not present

## 2018-01-09 DIAGNOSIS — M5417 Radiculopathy, lumbosacral region: Secondary | ICD-10-CM | POA: Diagnosis not present

## 2018-01-09 DIAGNOSIS — G894 Chronic pain syndrome: Secondary | ICD-10-CM | POA: Diagnosis not present

## 2018-01-09 DIAGNOSIS — I10 Essential (primary) hypertension: Secondary | ICD-10-CM | POA: Diagnosis not present

## 2018-01-09 DIAGNOSIS — Z79899 Other long term (current) drug therapy: Secondary | ICD-10-CM | POA: Diagnosis not present

## 2018-01-09 DIAGNOSIS — M5440 Lumbago with sciatica, unspecified side: Secondary | ICD-10-CM | POA: Diagnosis not present

## 2018-01-09 DIAGNOSIS — M79605 Pain in left leg: Secondary | ICD-10-CM | POA: Diagnosis not present

## 2018-02-06 DIAGNOSIS — M5440 Lumbago with sciatica, unspecified side: Secondary | ICD-10-CM | POA: Diagnosis not present

## 2018-02-06 DIAGNOSIS — I10 Essential (primary) hypertension: Secondary | ICD-10-CM | POA: Diagnosis not present

## 2018-02-06 DIAGNOSIS — Z5181 Encounter for therapeutic drug level monitoring: Secondary | ICD-10-CM | POA: Diagnosis not present

## 2018-02-06 DIAGNOSIS — Z79899 Other long term (current) drug therapy: Secondary | ICD-10-CM | POA: Diagnosis not present

## 2018-02-06 DIAGNOSIS — M5417 Radiculopathy, lumbosacral region: Secondary | ICD-10-CM | POA: Diagnosis not present

## 2018-02-06 DIAGNOSIS — F3172 Bipolar disorder, in full remission, most recent episode hypomanic: Secondary | ICD-10-CM | POA: Diagnosis not present

## 2018-02-06 DIAGNOSIS — G894 Chronic pain syndrome: Secondary | ICD-10-CM | POA: Diagnosis not present

## 2018-02-06 DIAGNOSIS — M79605 Pain in left leg: Secondary | ICD-10-CM | POA: Diagnosis not present

## 2018-02-20 DIAGNOSIS — F411 Generalized anxiety disorder: Secondary | ICD-10-CM | POA: Diagnosis not present

## 2018-02-20 DIAGNOSIS — I10 Essential (primary) hypertension: Secondary | ICD-10-CM | POA: Diagnosis not present

## 2018-02-20 DIAGNOSIS — F3172 Bipolar disorder, in full remission, most recent episode hypomanic: Secondary | ICD-10-CM | POA: Diagnosis not present

## 2018-02-20 DIAGNOSIS — F4001 Agoraphobia with panic disorder: Secondary | ICD-10-CM | POA: Diagnosis not present

## 2018-05-07 DIAGNOSIS — M5440 Lumbago with sciatica, unspecified side: Secondary | ICD-10-CM | POA: Diagnosis not present

## 2018-05-07 DIAGNOSIS — M549 Dorsalgia, unspecified: Secondary | ICD-10-CM | POA: Diagnosis not present

## 2018-05-07 DIAGNOSIS — I10 Essential (primary) hypertension: Secondary | ICD-10-CM | POA: Diagnosis not present

## 2018-05-07 DIAGNOSIS — M5417 Radiculopathy, lumbosacral region: Secondary | ICD-10-CM | POA: Diagnosis not present

## 2018-05-07 DIAGNOSIS — G894 Chronic pain syndrome: Secondary | ICD-10-CM | POA: Diagnosis not present

## 2019-12-24 ENCOUNTER — Emergency Department (HOSPITAL_COMMUNITY): Payer: Medicare Other

## 2019-12-24 ENCOUNTER — Encounter (HOSPITAL_COMMUNITY): Payer: Self-pay | Admitting: *Deleted

## 2019-12-24 ENCOUNTER — Other Ambulatory Visit: Payer: Self-pay

## 2019-12-24 ENCOUNTER — Emergency Department (HOSPITAL_COMMUNITY)
Admission: EM | Admit: 2019-12-24 | Discharge: 2019-12-26 | Disposition: A | Discharge status: Other Acute Inpatient Hospital | Payer: Medicare Other | Attending: Emergency Medicine | Admitting: Emergency Medicine

## 2019-12-24 DIAGNOSIS — I5022 Chronic systolic (congestive) heart failure: Secondary | ICD-10-CM | POA: Insufficient documentation

## 2019-12-24 DIAGNOSIS — W228XXA Striking against or struck by other objects, initial encounter: Secondary | ICD-10-CM | POA: Diagnosis not present

## 2019-12-24 DIAGNOSIS — N182 Chronic kidney disease, stage 2 (mild): Secondary | ICD-10-CM | POA: Diagnosis not present

## 2019-12-24 DIAGNOSIS — Z87891 Personal history of nicotine dependence: Secondary | ICD-10-CM | POA: Insufficient documentation

## 2019-12-24 DIAGNOSIS — I130 Hypertensive heart and chronic kidney disease with heart failure and stage 1 through stage 4 chronic kidney disease, or unspecified chronic kidney disease: Secondary | ICD-10-CM | POA: Diagnosis not present

## 2019-12-24 DIAGNOSIS — Y999 Unspecified external cause status: Secondary | ICD-10-CM | POA: Diagnosis not present

## 2019-12-24 DIAGNOSIS — Z20822 Contact with and (suspected) exposure to covid-19: Secondary | ICD-10-CM | POA: Insufficient documentation

## 2019-12-24 DIAGNOSIS — Y9289 Other specified places as the place of occurrence of the external cause: Secondary | ICD-10-CM | POA: Insufficient documentation

## 2019-12-24 DIAGNOSIS — Y9389 Activity, other specified: Secondary | ICD-10-CM | POA: Insufficient documentation

## 2019-12-24 DIAGNOSIS — Z79899 Other long term (current) drug therapy: Secondary | ICD-10-CM | POA: Diagnosis not present

## 2019-12-24 DIAGNOSIS — S51812A Laceration without foreign body of left forearm, initial encounter: Secondary | ICD-10-CM | POA: Insufficient documentation

## 2019-12-24 DIAGNOSIS — F309 Manic episode, unspecified: Secondary | ICD-10-CM

## 2019-12-24 DIAGNOSIS — R519 Headache, unspecified: Secondary | ICD-10-CM | POA: Diagnosis not present

## 2019-12-24 DIAGNOSIS — R911 Solitary pulmonary nodule: Secondary | ICD-10-CM | POA: Diagnosis not present

## 2019-12-24 DIAGNOSIS — S59912A Unspecified injury of left forearm, initial encounter: Secondary | ICD-10-CM | POA: Diagnosis present

## 2019-12-24 LAB — CBC WITH DIFFERENTIAL/PLATELET
Abs Immature Granulocytes: 0.11 10*3/uL — ABNORMAL HIGH (ref 0.00–0.07)
Basophils Absolute: 0.1 10*3/uL (ref 0.0–0.1)
Basophils Relative: 1 %
Eosinophils Absolute: 0.2 10*3/uL (ref 0.0–0.5)
Eosinophils Relative: 1 %
HCT: 44.9 % (ref 39.0–52.0)
Hemoglobin: 14.8 g/dL (ref 13.0–17.0)
Immature Granulocytes: 1 %
Lymphocytes Relative: 9 %
Lymphs Abs: 1.2 10*3/uL (ref 0.7–4.0)
MCH: 29.8 pg (ref 26.0–34.0)
MCHC: 33 g/dL (ref 30.0–36.0)
MCV: 90.5 fL (ref 80.0–100.0)
Monocytes Absolute: 0.9 10*3/uL (ref 0.1–1.0)
Monocytes Relative: 7 %
Neutro Abs: 10.2 10*3/uL — ABNORMAL HIGH (ref 1.7–7.7)
Neutrophils Relative %: 81 %
Platelets: 306 10*3/uL (ref 150–400)
RBC: 4.96 MIL/uL (ref 4.22–5.81)
RDW: 13.2 % (ref 11.5–15.5)
WBC: 12.6 10*3/uL — ABNORMAL HIGH (ref 4.0–10.5)
nRBC: 0 % (ref 0.0–0.2)

## 2019-12-24 LAB — COMPREHENSIVE METABOLIC PANEL
ALT: 30 U/L (ref 0–44)
AST: 25 U/L (ref 15–41)
Albumin: 4.2 g/dL (ref 3.5–5.0)
Alkaline Phosphatase: 106 U/L (ref 38–126)
Anion gap: 11 (ref 5–15)
BUN: 10 mg/dL (ref 8–23)
CO2: 26 mmol/L (ref 22–32)
Calcium: 9.3 mg/dL (ref 8.9–10.3)
Chloride: 104 mmol/L (ref 98–111)
Creatinine, Ser: 1.49 mg/dL — ABNORMAL HIGH (ref 0.61–1.24)
GFR calc Af Amer: 56 mL/min — ABNORMAL LOW (ref >60)
GFR calc non Af Amer: 48 mL/min — ABNORMAL LOW (ref >60)
Glucose, Bld: 111 mg/dL — ABNORMAL HIGH (ref 70–99)
Potassium: 3.8 mmol/L (ref 3.5–5.1)
Sodium: 141 mmol/L (ref 135–145)
Total Bilirubin: 0.8 mg/dL (ref 0.3–1.2)
Total Protein: 7.2 g/dL (ref 6.5–8.1)

## 2019-12-24 LAB — URINALYSIS, ROUTINE W REFLEX MICROSCOPIC
Bilirubin Urine: NEGATIVE
Glucose, UA: NEGATIVE mg/dL
Ketones, ur: NEGATIVE mg/dL
Nitrite: NEGATIVE
Protein, ur: NEGATIVE mg/dL
Specific Gravity, Urine: 1.01 (ref 1.005–1.030)
WBC, UA: >50 WBC/hpf — ABNORMAL HIGH (ref 0–5)
pH: 6 (ref 5.0–8.0)

## 2019-12-24 LAB — RAPID URINE DRUG SCREEN, HOSP PERFORMED
Amphetamines: NOT DETECTED
Barbiturates: NOT DETECTED
Benzodiazepines: POSITIVE — AB
Cocaine: NOT DETECTED
Opiates: POSITIVE — AB
Tetrahydrocannabinol: NOT DETECTED

## 2019-12-24 LAB — ACETAMINOPHEN LEVEL: Acetaminophen (Tylenol), Serum: <10 ug/mL — ABNORMAL LOW (ref 10–30)

## 2019-12-24 LAB — SALICYLATE LEVEL: Salicylate Lvl: <7 mg/dL — ABNORMAL LOW (ref 7.0–30.0)

## 2019-12-24 LAB — ETHANOL: Alcohol, Ethyl (B): <10 mg/dL (ref <10)

## 2019-12-24 LAB — SARS CORONAVIRUS 2 BY RT PCR (HOSPITAL ORDER, PERFORMED IN ~~LOC~~ HOSPITAL LAB): SARS Coronavirus 2: NEGATIVE

## 2019-12-24 MED ORDER — ACETAMINOPHEN 325 MG PO TABS
650.0000 mg | ORAL_TABLET | Freq: Once | ORAL | Status: AC
  Administered 2019-12-24: 650 mg via ORAL
  Filled 2019-12-24: qty 2

## 2019-12-24 MED ORDER — QUETIAPINE FUMARATE ER 50 MG PO TB24
50.0000 mg | ORAL_TABLET | Freq: Every day | ORAL | Status: DC
  Administered 2019-12-24: 50 mg via ORAL
  Filled 2019-12-24: qty 1

## 2019-12-24 MED ORDER — ASPIRIN 81 MG PO CHEW
81.0000 mg | CHEWABLE_TABLET | Freq: Every day | ORAL | Status: DC
  Administered 2019-12-24 – 2019-12-25 (×2): 81 mg via ORAL
  Filled 2019-12-24 (×2): qty 1

## 2019-12-24 MED ORDER — BACLOFEN 10 MG PO TABS
20.0000 mg | ORAL_TABLET | Freq: Three times a day (TID) | ORAL | Status: DC
  Administered 2019-12-24 – 2019-12-25 (×4): 20 mg via ORAL
  Filled 2019-12-24 (×4): qty 2

## 2019-12-24 MED ORDER — ATORVASTATIN CALCIUM 80 MG PO TABS
80.0000 mg | ORAL_TABLET | Freq: Every day | ORAL | Status: DC
  Administered 2019-12-24: 80 mg via ORAL
  Filled 2019-12-24: qty 2
  Filled 2019-12-24 (×2): qty 1

## 2019-12-24 MED ORDER — VITAMIN B-12 1000 MCG PO TABS
1000.0000 ug | ORAL_TABLET | Freq: Every day | ORAL | Status: DC
  Administered 2019-12-24: 1000 ug via ORAL
  Filled 2019-12-24 (×3): qty 1

## 2019-12-24 MED ORDER — ONDANSETRON 4 MG PO TBDP: 4.0000 mg | ORAL_TABLET | Freq: Three times a day (TID) | ORAL | Status: DC | PRN

## 2019-12-24 MED ORDER — CARVEDILOL 3.125 MG PO TABS
6.2500 mg | ORAL_TABLET | Freq: Two times a day (BID) | ORAL | Status: DC
  Administered 2019-12-24 – 2019-12-25 (×3): 6.25 mg via ORAL
  Filled 2019-12-24 (×3): qty 2

## 2019-12-24 MED ORDER — IBUPROFEN 200 MG PO TABS
600.0000 mg | ORAL_TABLET | Freq: Once | ORAL | Status: AC
  Administered 2019-12-24: 600 mg via ORAL
  Filled 2019-12-24: qty 3

## 2019-12-24 MED ORDER — HYDROCODONE-ACETAMINOPHEN 10-325 MG PO TABS
1.0000 | ORAL_TABLET | Freq: Three times a day (TID) | ORAL | Status: DC
  Administered 2019-12-24 – 2019-12-26 (×6): 1 via ORAL
  Filled 2019-12-24 (×6): qty 1

## 2019-12-24 MED ORDER — FENTANYL CITRATE (PF) 100 MCG/2ML IJ SOLN
100.0000 ug | Freq: Once | INTRAMUSCULAR | Status: AC
  Administered 2019-12-24: 100 ug via INTRAMUSCULAR
  Filled 2019-12-24: qty 2

## 2019-12-24 MED ORDER — AMLODIPINE BESYLATE 5 MG PO TABS
10.0000 mg | ORAL_TABLET | Freq: Every day | ORAL | Status: DC
  Administered 2019-12-24 – 2019-12-25 (×2): 10 mg via ORAL
  Filled 2019-12-24 (×2): qty 2

## 2019-12-24 NOTE — Discharge Instructions (Signed)
Your CT scan shows a lung nodule. You need a non-contrast chest CT at 3-6 months

## 2019-12-24 NOTE — ED Triage Notes (Addendum)
BIB EMS and [employer]. Pt is in police custody after being tazed. Reports pain that is chronic. Also has abrasions on rt knee and left arm. 160/80-80-97% CBG 102  EMS reports pt is going to be IVC'd by police and they plan to get a court order to obtain blood due to getting blood on officer.
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
## 2019-12-25 ENCOUNTER — Emergency Department (HOSPITAL_COMMUNITY): Payer: Medicare Other

## 2019-12-25 MED ORDER — QUETIAPINE FUMARATE ER 50 MG PO TB24
100.0000 mg | ORAL_TABLET | Freq: Every day | ORAL | Status: DC
  Administered 2019-12-25: 100 mg via ORAL
  Filled 2019-12-25: qty 2

## 2019-12-25 NOTE — BH Assessment (Addendum)
[facility] Assessment Progress Note  Per Marciano Sequin, NP, this pt requires psychiatric hospitalization at this time.  Pt presents under IVC initiated by Pricilla Loveless, MD.  At 15:55 Tammy calls from [facility] to report that pt has been accepted to their facility by Dr Fabio Neighbors.  EDP Jacalyn Lefevre, MD concurs with this decision.  Pt's nurse, Kathlene November, has been notified, and agrees to call report to [phone].  Pt is to be transported via [facility].  Doylene Canning Behavioral Health Coordinator [phone]

## 2019-12-25 NOTE — BH Assessment (Addendum)
[facility] Assessment Progress Note  Per Marciano Sequin, NP, this pt requires psychiatric hospitalization at this time preferably at a facility offering specialty care for geriatric patients.  The following facilities have been contacted to seek placement for this pt, with results as noted:  Beds available, information sent, decision pending: [facility]  Unable to reach: Turner Daniels (left message, awaiting return call)  At capacity: [place]   Doylene Canning, MA Behavioral Health Coordinator [phone]
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
## 2019-12-25 NOTE — ED Notes (Addendum)
Pt provided food tray, Pt calm, cooperative, ambulatory and continent.

## 2019-12-25 NOTE — Progress Notes (Signed)
RN called Sheriff to schedule a transport in [place] in [place].

## 2019-12-25 NOTE — ED Provider Notes (Signed)
Pt has been accepted for transfer by Dr. Molli Hazard to [facility] psychiatric facility.  Pt has been reexamined and remains stable for transfer.   Jacalyn Lefevre, MD 12/25/19 [phone]

## 2020-01-06 ENCOUNTER — Encounter (HOSPITAL_BASED_OUTPATIENT_CLINIC_OR_DEPARTMENT_OTHER): Payer: Self-pay | Admitting: Emergency Medicine

## 2020-01-06 ENCOUNTER — Other Ambulatory Visit: Payer: Self-pay

## 2020-01-06 ENCOUNTER — Emergency Department (HOSPITAL_BASED_OUTPATIENT_CLINIC_OR_DEPARTMENT_OTHER)
Admission: EM | Admit: 2020-01-06 | Discharge: 2020-01-06 | Disposition: A | Discharge status: Home / Self Care | Payer: Medicare Other | Attending: Emergency Medicine | Admitting: Emergency Medicine

## 2020-01-06 DIAGNOSIS — N182 Chronic kidney disease, stage 2 (mild): Secondary | ICD-10-CM | POA: Diagnosis not present

## 2020-01-06 DIAGNOSIS — M542 Cervicalgia: Secondary | ICD-10-CM | POA: Diagnosis present

## 2020-01-06 DIAGNOSIS — R2689 Other abnormalities of gait and mobility: Secondary | ICD-10-CM | POA: Diagnosis not present

## 2020-01-06 DIAGNOSIS — I130 Hypertensive heart and chronic kidney disease with heart failure and stage 1 through stage 4 chronic kidney disease, or unspecified chronic kidney disease: Secondary | ICD-10-CM | POA: Diagnosis not present

## 2020-01-06 DIAGNOSIS — G8929 Other chronic pain: Secondary | ICD-10-CM | POA: Diagnosis not present

## 2020-01-06 DIAGNOSIS — I5022 Chronic systolic (congestive) heart failure: Secondary | ICD-10-CM | POA: Diagnosis not present

## 2020-01-06 DIAGNOSIS — M545 Low back pain: Secondary | ICD-10-CM | POA: Diagnosis not present

## 2020-01-06 DIAGNOSIS — Z79899 Other long term (current) drug therapy: Secondary | ICD-10-CM | POA: Insufficient documentation

## 2020-01-06 DIAGNOSIS — Z87891 Personal history of nicotine dependence: Secondary | ICD-10-CM | POA: Diagnosis not present

## 2020-01-06 MED ORDER — FENTANYL CITRATE (PF) 100 MCG/2ML IJ SOLN
100.0000 ug | Freq: Once | INTRAMUSCULAR | Status: AC
  Administered 2020-01-06: 100 ug via INTRAMUSCULAR

## 2020-01-06 MED ORDER — HYDROCODONE-ACETAMINOPHEN 5-325 MG PO TABS
2.0000 | ORAL_TABLET | Freq: Once | ORAL | Status: AC
  Administered 2020-01-06: 2 via ORAL
  Filled 2020-01-06: qty 2

## 2020-01-06 MED ORDER — FENTANYL CITRATE (PF) 100 MCG/2ML IJ SOLN
100.0000 ug | Freq: Once | INTRAMUSCULAR | Status: DC
  Filled 2020-01-06: qty 2

## 2020-01-06 MED ORDER — CYCLOBENZAPRINE HCL 10 MG PO TABS
10.0000 mg | ORAL_TABLET | Freq: Once | ORAL | Status: AC
  Administered 2020-01-06: 10 mg via ORAL
  Filled 2020-01-06: qty 1

## 2020-01-06 NOTE — ED Triage Notes (Signed)
Patient presents via EMS in police custody with complaints of neck and lower back pain; C collar in place prior to arrival; patient and EMS denies any recent trauma/fall.

## 2020-01-06 NOTE — ED Provider Notes (Signed)
[facility] EMERGENCY DEPARTMENT Provider Note   CSN: [id number] Arrival date & time: 01/06/20  2139     History Chief Complaint  Patient presents with  . Neck Pain    Jared [name] is a 64 y.o. male.  He has a history of chronic neck and back pain and follows with the pain clinic.  He was recently seen in the emergency department for worsening neck and back pain after going into police custody.  He was ultimately sent to [facility] for psychiatric admission and discharged back to police custody 3 days ago.  He was released from the place today and hasn't had his pain medicine in 3 days.  Apparently he went to his ex-spouse house and the police arrested him again for violating a restraining order.  Complaining of severe neck and back pain.  Please state that the patient was allowed to get in the vehicle himself and did not sustain any new injuries.  The history is provided by the patient.  Neck Pain Pain location:  Generalized neck Quality:  Stabbing Pain radiates to:  L shoulder and R shoulder Pain severity:  Severe Pain is:  Same all the time Onset quality: chronic - worsening grradual. Timing:  Constant Progression:  Worsening Chronicity:  Chronic Context: not fall   Relieved by:  None tried Worsened by:  Nothing Ineffective treatments:  None tried Associated symptoms: no bladder incontinence, no bowel incontinence, no chest pain, no fever, no paresis and no visual change        Past Medical History:  Diagnosis Date  . Anxiety   . BPH (benign prostatic hypertrophy)   . Chest pain   . Chronic back pain   . Chronic prescription opiate use   . Depression   . DJD (degenerative joint disease)   . HTN (hypertension)   . Hypercholesteremia   . Hyperlipemia   . Obesity   . Opiate dependence, continuous (HCC)   . OSA (obstructive sleep apnea)   . Seizures [facility])     Patient Active Problem List   Diagnosis Date Noted  . History of stroke 02/03/2016  .  Generalized anxiety disorder 09/03/2015  . Depression with anxiety 07/22/2015  . Psychosomatic factor in physical condition 07/22/2015  . Anxiety and depression 07/22/2015  . PRES (posterior reversible encephalopathy syndrome) 05/18/2015  . Provoked seizures (HCC) 05/18/2015  . Abdominal pain   . Leukocytosis   . Chronic atrial fibrillation (HCC)   . Chronic systolic heart failure (HCC)   . Hyperkalemia   . Chronic kidney disease   . Thrombocytopenia (HCC)   . Paroxysmal atrial fibrillation (HCC)   . Pulmonary hypertension (HCC)   . Chronic back pain 01/18/2015  . History of non-ST elevation myocardial infarction (NSTEMI) 01/13/2015  . CKD Stage 2 (GFR 72 ml/min) 02/04/2014  . Mixed hyperlipidemia 05/22/2013  . Prediabetes 05/22/2013  . OSA on CPAP 05/22/2013  . Reflux esophagitis 05/22/2013  . Testosterone deficiency 05/22/2013  . Vitamin D deficiency 05/22/2013  . DDD (degenerative disc disease), lumbar 05/22/2013  . Chronic pain syndrome 05/22/2013  . HTN (hypertension) 01/20/2011    Past Surgical History:  Procedure Laterality Date  . APPLICATION OF WOUND VAC N/A 01/08/2015   Procedure: APPLICATION OF WOUND VAC;  Surgeon: Manus Rudd, MD;  Location: [facility] OR;  Service: General;  Laterality: N/A;  . BACK SURGERY     Lspine w/Hardware  . CARDIAC CATHETERIZATION N/A 01/13/2015   Procedure: Left Heart Cath and Coronary Angiography;  Surgeon:  Corky Crafts, MD;  Location: [facility] INVASIVE CV LAB;  Service: Cardiovascular;  Laterality: N/A;  . CERVICAL SPINE SURGERY     w/Hardware  . LAPAROTOMY N/A 01/08/2015   Procedure: EXPLORATORY LAPAROTOMY;  Surgeon: Manus Rudd, MD;  Location: [facility] OR;  Service: General;  Laterality: N/A;  . PROSTATECTOMY    . WOUND DEBRIDEMENT N/A 01/11/2015   Procedure: DEBRIDEMENT WOUND AND VAC DRESSING CHANGE;  Surgeon: Manus Rudd, MD;  Location: [facility] OR;  Service: General;  Laterality: N/A;       Family History  Problem Relation Age of Onset  .  Hypertension Mother   . Hypertension Father     Social History   Tobacco Use  . Smoking status: Former Games developer  . Smokeless tobacco: Never Used  Substance Use Topics  . Alcohol use: No    Alcohol/week: 0.0 standard drinks  . Drug use: No    Home Medications Prior to Admission medications   Medication Sig Start Date End Date Taking? Authorizing Provider  amLODipine (NORVASC) 10 MG tablet Take 1 tablet (10 mg total) by mouth daily. 11/11/15   Waldon Merl, PA-C  aspirin 81 MG tablet Take 81 mg by mouth daily.    [provider]  atorvastatin (LIPITOR) 80 MG tablet Take 80 mg by mouth daily. 11/14/19   [provider]  baclofen (LIORESAL) 20 MG tablet Take 20 mg by mouth 3 (three) times daily. 11/28/19   [provider]  carvedilol (COREG) 6.25 MG tablet TAKE 1 TABLET BY MOUTH TWICE DAILY WITH A MEAL Patient taking differently: Take 6.25 mg by mouth 2 (two) times daily with a meal. TAKE 1 TABLET BY MOUTH TWICE DAILY WITH A MEAL 11/11/15   Waldon Merl, PA-C  Cholecalciferol (VITAMIN D-3) 1000 UNITS CAPS Take 2,000 Units by mouth daily.    [provider]  citalopram (CELEXA) 40 MG tablet TAKE 1 TABLET BY MOUTH EVERY DAY FOR MOOD Patient not taking: Reported on 12/24/2019 11/11/15   Waldon Merl, PA-C  famotidine (PEPCID) 20 MG tablet TAKE 1 TABLET (20 MG TOTAL) BY MOUTH 2 (TWO) TIMES DAILY. Patient not taking: Reported on 12/24/2019 05/11/16   Sharlene Dory, DO  fluticasone [facility]) 50 MCG/ACT nasal spray PLACE 1 SPRAY IN EACH NOSTRIL EVERYDAY Patient taking differently: Place 2 sprays into both nostrils daily.  03/10/16   Waldon Merl, PA-C  hydrALAZINE (APRESOLINE) 50 MG tablet Take 1 tablet (50 mg total) by mouth 2 (two) times daily. Patient taking differently: Take 50 mg by mouth 3 (three) times daily.  11/11/15   Waldon Merl, PA-C  HYDROcodone-acetaminophen (NORCO) 10-325 MG tablet Take 1 tablet by mouth every 8 (eight)  hours as needed for pain. 11/28/19   [provider]  LORazepam (ATIVAN) 1 MG tablet Take 1 tablet (1 mg total) by mouth every 8 (eight) hours as needed. for anxiety Patient not taking: Reported on 12/24/2019 06/14/16   Sandford Craze, NP  methocarbamol (ROBAXIN) 500 MG tablet TAKE 1 TABLET BY MOUTH 4 TIMES DAILY Patient not taking: Reported on 12/24/2019 05/04/16   Sharlene Dory, DO  Multiple Vitamin (MULTIVITAMIN WITH MINERALS) TABS tablet Take 1 tablet by mouth daily. Patient not taking: Reported on 12/24/2019 02/11/15   Danford Bad A, NP  ondansetron (ZOFRAN-ODT) 4 MG disintegrating tablet DISSOLVE 1 TABLET BY MOUTH EVERY 8 HOURS AS NEEDED FOR NAUSEA OR VOMITING. Patient not taking: Reported on 12/24/2019 03/30/16   Waldon Merl, PA-C  QUEtiapine (SEROQUEL XR) 50  MG TB24 24 hr tablet Take 50 mg by mouth at bedtime. 12/10/19   [provider]  vitamin B-12 (CYANOCOBALAMIN) 1000 MCG tablet Take 1,000 mcg by mouth daily. 12/10/19   [provider]    Allergies    Lyrica [pregabalin], Morphine and related, Magnesium, Montelukast, Neurontin [gabapentin], Prednisone, and Wellbutrin [bupropion]  Review of Systems   Review of Systems  Constitutional: Negative for fever.  HENT: Negative for sore throat.   Eyes: Negative for visual disturbance.  Respiratory: Negative for shortness of breath.   Cardiovascular: Negative for chest pain.  Gastrointestinal: Negative for abdominal pain and bowel incontinence.  Genitourinary: Negative for bladder incontinence and dysuria.  Musculoskeletal: Positive for back pain, gait problem and neck pain.  Skin: Negative for rash.  Neurological: Negative for seizures.    Physical Exam Updated Vital Signs BP 133/70 (BP Location: Right Arm)   Pulse 70   Temp 98.7 F (37.1 C) (Oral)   Resp 20   Ht 6\' 2"  (1.88 m)   Wt 110 kg   SpO2 98%   BMI 31.14 kg/m   Physical Exam Vitals and nursing note reviewed.    Constitutional:      Appearance: Normal appearance. He is well-developed.  HENT:     Head: Normocephalic and atraumatic.  Eyes:     Conjunctiva/sclera: Conjunctivae normal.  Cardiovascular:     Rate and Rhythm: Normal rate and regular rhythm.     Heart sounds: No murmur heard.   Pulmonary:     Effort: Pulmonary effort is normal. No respiratory distress.     Breath sounds: Normal breath sounds.  Abdominal:     Palpations: Abdomen is soft.     Tenderness: There is no abdominal tenderness.  Musculoskeletal:        General: Tenderness (back diffuse) present. Normal range of motion.     Cervical back: Tenderness present.  Skin:    General: Skin is warm and dry.  Neurological:     General: No focal deficit present.     Mental Status: He is alert.     ED Results / Procedures / Treatments   Labs (all labs ordered are listed, but only abnormal results are displayed) Labs Reviewed - No data to display  EKG None  Radiology No results found.  Procedures Procedures (including critical care time)  Medications Ordered in ED Medications  fentaNYL (SUBLIMAZE) injection 100 mcg (has no administration in time range)  HYDROcodone-acetaminophen (NORCO/VICODIN) 5-325 MG per tablet 2 tablet (has no administration in time range)  cyclobenzaprine (FLEXERIL) tablet 10 mg (has no administration in time range)    ED Course  I have reviewed the triage vital signs and the nursing notes.  Pertinent labs & imaging results that were available during my care of the patient were reviewed by me and considered in my medical decision making (see chart for details).    MDM Rules/Calculators/A&P                         64 year old male with chronic neck and back pain here in please custody with worsening of his neck and back pain.  No new trauma.  Had CT imaging of his neck just a week ago for similar presentation and please custody.  Hasn't had his medicine in a few days.  Will dose of this  medication given an IM injection of some fentanyl.  Don't think he needs advanced imaging at this time.  Differential includes chronic pain,  medication withdrawal, malingering.  Patient released back to police custody and ambulated out of the department without any difficulty. Final Clinical Impression(s) / ED Diagnoses Final diagnoses:  Chronic cervical pain  Chronic low back pain, unspecified back pain laterality, unspecified whether sciatica present    Rx / DC Orders ED Discharge Orders    None       Terrilee Files, MD 01/07/20 1017

## 2020-01-06 NOTE — Discharge Instructions (Addendum)
You were seen in the emergency department for evaluation of worsening neck and back pain after being out of your medication for a few days.  He received some medication here.  Please follow-up with your chronic pain specialist.  Return to the emergency department for any worsening or concerning symptoms.

## 2020-01-06 NOTE — ED Triage Notes (Signed)
PD at bedside with patient in handcuffs.
